# Patient Record
Sex: Male | Born: 1950 | Hispanic: No | Marital: Married | Smoking: Former smoker
Health system: Southern US, Community
[De-identification: ages and names within clinical notes are randomized; demographics above are authoritative.]

## PROBLEM LIST (undated history)

## (undated) ENCOUNTER — Emergency Department (HOSPITAL_COMMUNITY): Admission: EM | Payer: Medicare Other | Source: Home / Self Care

## (undated) DIAGNOSIS — N189 Chronic kidney disease, unspecified: Secondary | ICD-10-CM

## (undated) DIAGNOSIS — D509 Iron deficiency anemia, unspecified: Secondary | ICD-10-CM

## (undated) DIAGNOSIS — E7800 Pure hypercholesterolemia, unspecified: Secondary | ICD-10-CM

## (undated) DIAGNOSIS — E119 Type 2 diabetes mellitus without complications: Secondary | ICD-10-CM

## (undated) DIAGNOSIS — I2510 Atherosclerotic heart disease of native coronary artery without angina pectoris: Secondary | ICD-10-CM

## (undated) DIAGNOSIS — R002 Palpitations: Secondary | ICD-10-CM

## (undated) DIAGNOSIS — I5020 Unspecified systolic (congestive) heart failure: Secondary | ICD-10-CM

## (undated) DIAGNOSIS — I10 Essential (primary) hypertension: Secondary | ICD-10-CM

## (undated) HISTORY — DX: Palpitations: R00.2

## (undated) HISTORY — PX: COLONOSCOPY: SHX174

## (undated) HISTORY — DX: Atherosclerotic heart disease of native coronary artery without angina pectoris: I25.10

## (undated) HISTORY — DX: Chronic kidney disease, unspecified: N18.9

## (undated) HISTORY — PX: CORONARY ANGIOPLASTY WITH STENT PLACEMENT: SHX49

## (undated) HISTORY — DX: Unspecified systolic (congestive) heart failure: I50.20

## (undated) HISTORY — DX: Iron deficiency anemia, unspecified: D50.9

---
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
## 2017-06-01 ENCOUNTER — Encounter: Payer: Self-pay | Admitting: *Deleted

## 2017-06-01 DIAGNOSIS — Z23 Encounter for immunization: Secondary | ICD-10-CM

## 2020-03-15 ENCOUNTER — Other Ambulatory Visit: Payer: Self-pay

## 2020-03-15 ENCOUNTER — Emergency Department (HOSPITAL_BASED_OUTPATIENT_CLINIC_OR_DEPARTMENT_OTHER): Payer: Medicare Other

## 2020-03-15 ENCOUNTER — Encounter (HOSPITAL_BASED_OUTPATIENT_CLINIC_OR_DEPARTMENT_OTHER): Payer: Self-pay

## 2020-03-15 DIAGNOSIS — I25110 Atherosclerotic heart disease of native coronary artery with unstable angina pectoris: Principal | ICD-10-CM | POA: Diagnosis present

## 2020-03-15 DIAGNOSIS — I493 Ventricular premature depolarization: Secondary | ICD-10-CM | POA: Diagnosis present

## 2020-03-15 DIAGNOSIS — Z79899 Other long term (current) drug therapy: Secondary | ICD-10-CM

## 2020-03-15 DIAGNOSIS — Z20822 Contact with and (suspected) exposure to covid-19: Secondary | ICD-10-CM | POA: Diagnosis present

## 2020-03-15 DIAGNOSIS — E8770 Fluid overload, unspecified: Secondary | ICD-10-CM | POA: Diagnosis present

## 2020-03-15 DIAGNOSIS — I129 Hypertensive chronic kidney disease with stage 1 through stage 4 chronic kidney disease, or unspecified chronic kidney disease: Secondary | ICD-10-CM | POA: Diagnosis present

## 2020-03-15 DIAGNOSIS — Z87891 Personal history of nicotine dependence: Secondary | ICD-10-CM

## 2020-03-15 DIAGNOSIS — N179 Acute kidney failure, unspecified: Secondary | ICD-10-CM | POA: Diagnosis present

## 2020-03-15 DIAGNOSIS — R079 Chest pain, unspecified: Secondary | ICD-10-CM | POA: Diagnosis not present

## 2020-03-15 DIAGNOSIS — N183 Chronic kidney disease, stage 3 unspecified: Secondary | ICD-10-CM | POA: Diagnosis present

## 2020-03-15 DIAGNOSIS — I255 Ischemic cardiomyopathy: Secondary | ICD-10-CM | POA: Diagnosis present

## 2020-03-15 DIAGNOSIS — Z7984 Long term (current) use of oral hypoglycemic drugs: Secondary | ICD-10-CM

## 2020-03-15 DIAGNOSIS — E785 Hyperlipidemia, unspecified: Secondary | ICD-10-CM | POA: Diagnosis present

## 2020-03-15 DIAGNOSIS — R195 Other fecal abnormalities: Secondary | ICD-10-CM | POA: Diagnosis present

## 2020-03-15 DIAGNOSIS — Z955 Presence of coronary angioplasty implant and graft: Secondary | ICD-10-CM

## 2020-03-15 DIAGNOSIS — E1122 Type 2 diabetes mellitus with diabetic chronic kidney disease: Secondary | ICD-10-CM | POA: Diagnosis present

## 2020-03-15 DIAGNOSIS — Z7902 Long term (current) use of antithrombotics/antiplatelets: Secondary | ICD-10-CM

## 2020-03-15 LAB — CBC
HCT: 27.7 % — ABNORMAL LOW (ref 39.0–52.0)
Hemoglobin: 8.7 g/dL — ABNORMAL LOW (ref 13.0–17.0)
MCH: 26.2 pg (ref 26.0–34.0)
MCHC: 31.4 g/dL (ref 30.0–36.0)
MCV: 83.4 fL (ref 80.0–100.0)
Platelets: 397 10*3/uL (ref 150–400)
RBC: 3.32 MIL/uL — ABNORMAL LOW (ref 4.22–5.81)
RDW: 14 % (ref 11.5–15.5)
WBC: 7.8 10*3/uL (ref 4.0–10.5)
nRBC: 0 % (ref 0.0–0.2)

## 2020-03-15 LAB — BASIC METABOLIC PANEL
Anion gap: 12 (ref 5–15)
BUN: 36 mg/dL — ABNORMAL HIGH (ref 8–23)
CO2: 23 mmol/L (ref 22–32)
Calcium: 9 mg/dL (ref 8.9–10.3)
Chloride: 103 mmol/L (ref 98–111)
Creatinine, Ser: 1.4 mg/dL — ABNORMAL HIGH (ref 0.61–1.24)
GFR calc Af Amer: 60 mL/min — ABNORMAL LOW (ref >60)
GFR calc non Af Amer: 52 mL/min — ABNORMAL LOW (ref >60)
Glucose, Bld: 164 mg/dL — ABNORMAL HIGH (ref 70–99)
Potassium: 4.3 mmol/L (ref 3.5–5.1)
Sodium: 138 mmol/L (ref 135–145)

## 2020-03-15 LAB — TROPONIN I (HIGH SENSITIVITY): Troponin I (High Sensitivity): 13 ng/L (ref <18)

## 2020-03-15 LAB — CBG MONITORING, ED: Glucose-Capillary: 176 mg/dL — ABNORMAL HIGH (ref 70–99)

## 2020-03-15 IMAGING — DX DG CHEST 2V
2 series · 2 of 2 positions shown · non-contrast
Comparison: None.

CLINICAL DATA: Chest pain.  Palpitations.

EXAM:
CHEST - 2 VIEW

[chest pa]
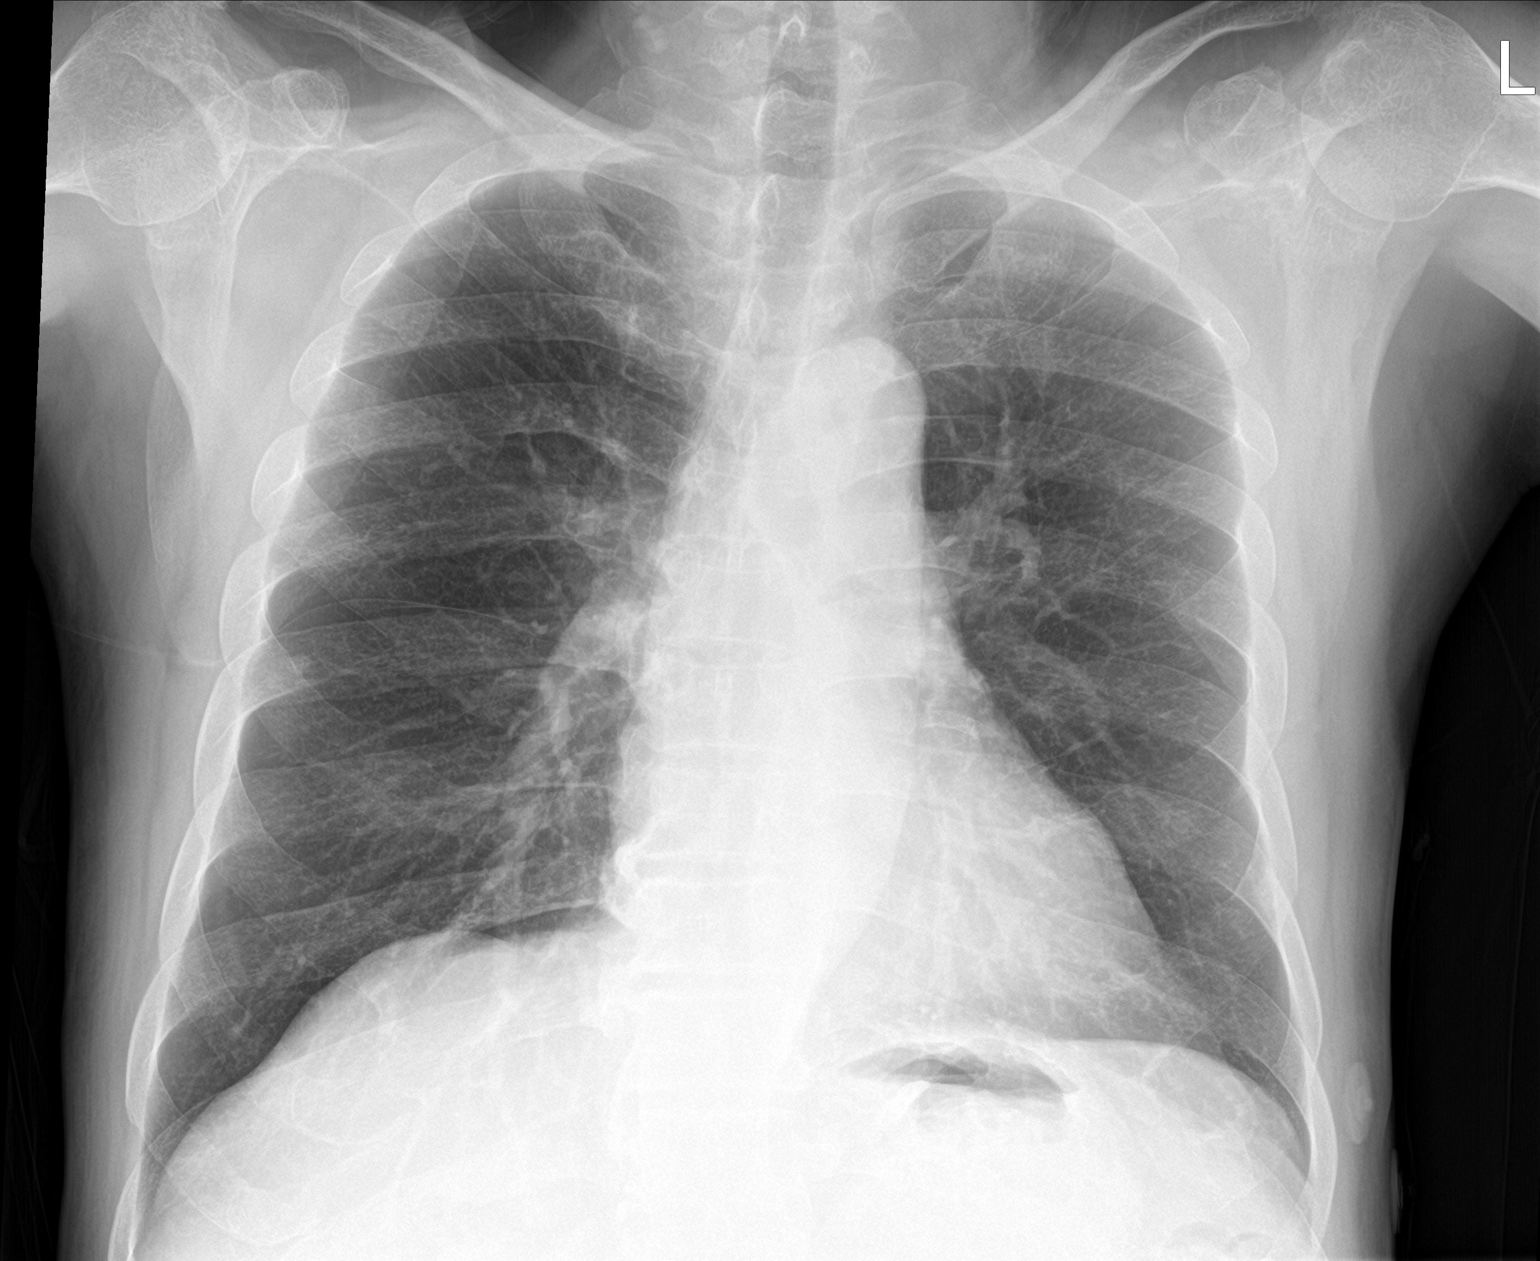

[chest lat]
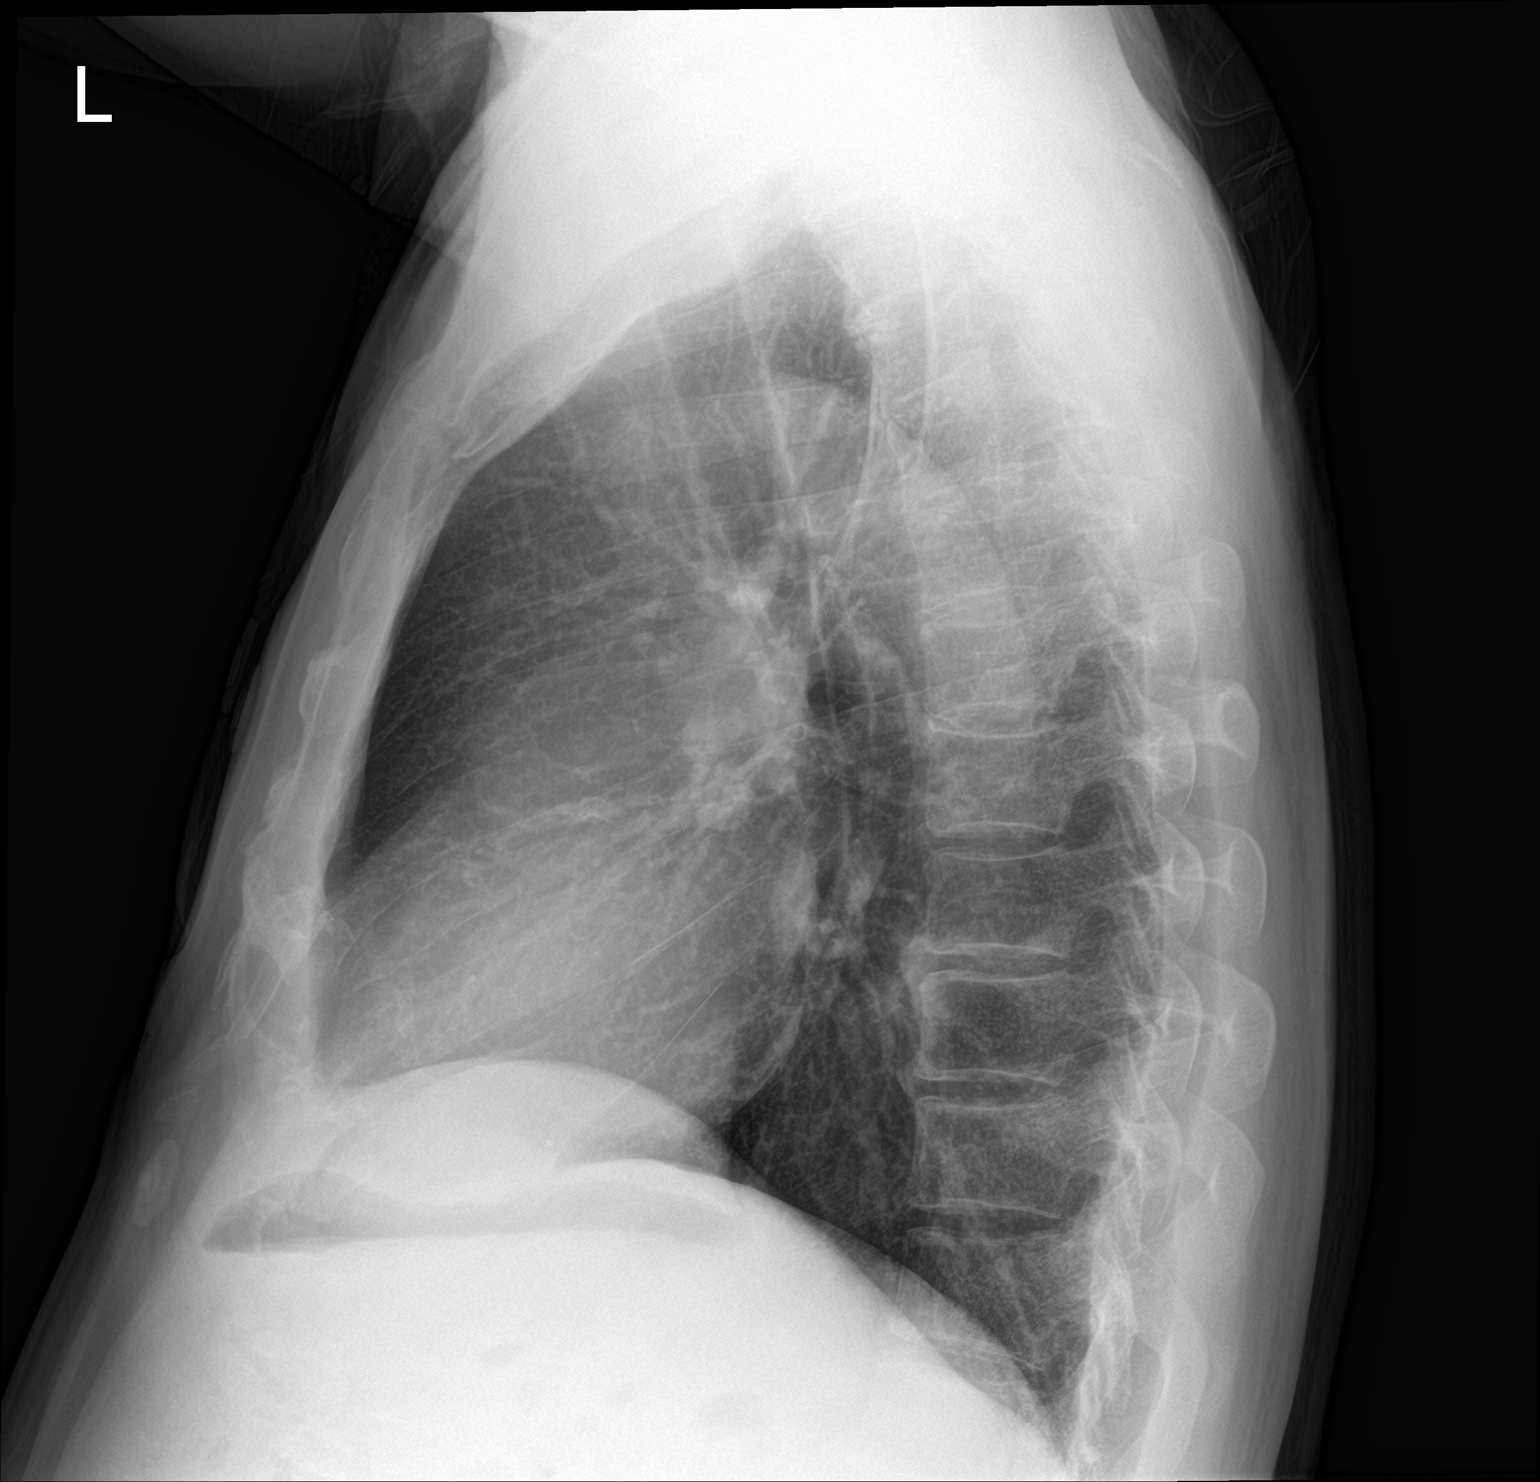

[2 of 2 positions shown; findings below may reference images not displayed]

FINDINGS: The cardiomediastinal contours are normal. Coronary stent is
visualized. Pulmonary vasculature is normal. No consolidation,
pleural effusion, or pneumothorax. Calcification projecting over the
right seventh rib may be a bone island or calcified granuloma,
regardless benign. No acute osseous abnormalities are seen.
IMPRESSION: 1. No acute chest findings.
2. Coronary stent.

## 2020-03-15 MED ORDER — SODIUM CHLORIDE 0.9% FLUSH
3.0000 mL | Freq: Once | INTRAVENOUS | Status: DC
  Filled 2020-03-15: qty 3

## 2020-03-15 NOTE — ED Triage Notes (Addendum)
Per daughter pt with CP since 7pm-palpitations x 1 week-denies fever/flu sx-pt speaks minimal english-NAD-to triage in w/c

## 2020-03-16 ENCOUNTER — Inpatient Hospital Stay (HOSPITAL_BASED_OUTPATIENT_CLINIC_OR_DEPARTMENT_OTHER)
Admission: EM | Admit: 2020-03-16 | Discharge: 2020-03-30 | DRG: 234 | Disposition: A | Discharge status: Home / Self Care | Payer: Medicare Other | Attending: Cardiothoracic Surgery | Admitting: Cardiothoracic Surgery

## 2020-03-16 DIAGNOSIS — N179 Acute kidney failure, unspecified: Secondary | ICD-10-CM | POA: Diagnosis not present

## 2020-03-16 DIAGNOSIS — R079 Chest pain, unspecified: Secondary | ICD-10-CM

## 2020-03-16 DIAGNOSIS — I25110 Atherosclerotic heart disease of native coronary artery with unstable angina pectoris: Secondary | ICD-10-CM | POA: Diagnosis not present

## 2020-03-16 DIAGNOSIS — I259 Chronic ischemic heart disease, unspecified: Secondary | ICD-10-CM

## 2020-03-16 DIAGNOSIS — J9811 Atelectasis: Secondary | ICD-10-CM

## 2020-03-16 DIAGNOSIS — Z79899 Other long term (current) drug therapy: Secondary | ICD-10-CM | POA: Diagnosis not present

## 2020-03-16 DIAGNOSIS — Z419 Encounter for procedure for purposes other than remedying health state, unspecified: Secondary | ICD-10-CM

## 2020-03-16 DIAGNOSIS — I493 Ventricular premature depolarization: Secondary | ICD-10-CM | POA: Diagnosis not present

## 2020-03-16 DIAGNOSIS — Z794 Long term (current) use of insulin: Secondary | ICD-10-CM

## 2020-03-16 DIAGNOSIS — I129 Hypertensive chronic kidney disease with stage 1 through stage 4 chronic kidney disease, or unspecified chronic kidney disease: Secondary | ICD-10-CM | POA: Diagnosis not present

## 2020-03-16 DIAGNOSIS — E119 Type 2 diabetes mellitus without complications: Secondary | ICD-10-CM

## 2020-03-16 DIAGNOSIS — Z20822 Contact with and (suspected) exposure to covid-19: Secondary | ICD-10-CM | POA: Diagnosis not present

## 2020-03-16 DIAGNOSIS — Z87891 Personal history of nicotine dependence: Secondary | ICD-10-CM | POA: Diagnosis not present

## 2020-03-16 DIAGNOSIS — Z7902 Long term (current) use of antithrombotics/antiplatelets: Secondary | ICD-10-CM | POA: Diagnosis not present

## 2020-03-16 DIAGNOSIS — N183 Chronic kidney disease, stage 3 unspecified: Secondary | ICD-10-CM | POA: Diagnosis not present

## 2020-03-16 DIAGNOSIS — I10 Essential (primary) hypertension: Secondary | ICD-10-CM

## 2020-03-16 DIAGNOSIS — E1142 Type 2 diabetes mellitus with diabetic polyneuropathy: Secondary | ICD-10-CM

## 2020-03-16 DIAGNOSIS — Z7984 Long term (current) use of oral hypoglycemic drugs: Secondary | ICD-10-CM | POA: Diagnosis not present

## 2020-03-16 DIAGNOSIS — E8770 Fluid overload, unspecified: Secondary | ICD-10-CM | POA: Diagnosis not present

## 2020-03-16 DIAGNOSIS — Z951 Presence of aortocoronary bypass graft: Secondary | ICD-10-CM

## 2020-03-16 DIAGNOSIS — I255 Ischemic cardiomyopathy: Secondary | ICD-10-CM | POA: Diagnosis not present

## 2020-03-16 DIAGNOSIS — Z955 Presence of coronary angioplasty implant and graft: Secondary | ICD-10-CM | POA: Diagnosis not present

## 2020-03-16 DIAGNOSIS — I2510 Atherosclerotic heart disease of native coronary artery without angina pectoris: Secondary | ICD-10-CM

## 2020-03-16 DIAGNOSIS — E785 Hyperlipidemia, unspecified: Secondary | ICD-10-CM | POA: Diagnosis not present

## 2020-03-16 DIAGNOSIS — E1122 Type 2 diabetes mellitus with diabetic chronic kidney disease: Secondary | ICD-10-CM | POA: Diagnosis not present

## 2020-03-16 DIAGNOSIS — R195 Other fecal abnormalities: Secondary | ICD-10-CM | POA: Diagnosis not present

## 2020-03-16 HISTORY — DX: Type 2 diabetes mellitus without complications: E11.9

## 2020-03-16 HISTORY — DX: Essential (primary) hypertension: I10

## 2020-03-16 HISTORY — DX: Pure hypercholesterolemia, unspecified: E78.00

## 2020-03-16 LAB — GLUCOSE, CAPILLARY
Glucose-Capillary: 119 mg/dL — ABNORMAL HIGH (ref 70–99)
Glucose-Capillary: 257 mg/dL — ABNORMAL HIGH (ref 70–99)

## 2020-03-16 LAB — TROPONIN I (HIGH SENSITIVITY)
Troponin I (High Sensitivity): 12 ng/L (ref <18)
Troponin I (High Sensitivity): 13 ng/L (ref <18)

## 2020-03-16 LAB — SARS CORONAVIRUS 2 BY RT PCR (HOSPITAL ORDER, PERFORMED IN ~~LOC~~ HOSPITAL LAB): SARS Coronavirus 2: NEGATIVE

## 2020-03-16 LAB — MRSA PCR SCREENING: MRSA by PCR: NEGATIVE

## 2020-03-16 LAB — CBG MONITORING, ED: Glucose-Capillary: 95 mg/dL (ref 70–99)

## 2020-03-16 MED ORDER — ASPIRIN 81 MG PO CHEW
324.0000 mg | CHEWABLE_TABLET | Freq: Once | ORAL | Status: AC
  Administered 2020-03-16: 324 mg via ORAL
  Filled 2020-03-16: qty 4

## 2020-03-16 MED ORDER — HYDROCHLOROTHIAZIDE 12.5 MG PO CAPS
12.5000 mg | ORAL_CAPSULE | Freq: Every day | ORAL | Status: DC
  Administered 2020-03-16: 12.5 mg via ORAL
  Filled 2020-03-16 (×3): qty 1

## 2020-03-16 MED ORDER — INSULIN ASPART 100 UNIT/ML ~~LOC~~ SOLN
0.0000 [IU] | Freq: Every day | SUBCUTANEOUS | Status: DC
  Administered 2020-03-16 – 2020-03-19 (×3): 3 [IU] via SUBCUTANEOUS
  Administered 2020-03-20 – 2020-03-23 (×3): 2 [IU] via SUBCUTANEOUS

## 2020-03-16 MED ORDER — LOSARTAN POTASSIUM 50 MG PO TABS
50.0000 mg | ORAL_TABLET | Freq: Every day | ORAL | Status: DC
  Administered 2020-03-16 – 2020-03-23 (×8): 50 mg via ORAL
  Filled 2020-03-16 (×4): qty 1
  Filled 2020-03-16: qty 2
  Filled 2020-03-16 (×3): qty 1

## 2020-03-16 MED ORDER — LOSARTAN POTASSIUM-HCTZ 50-12.5 MG PO TABS: 1.0000 | ORAL_TABLET | Freq: Every day | ORAL | Status: DC

## 2020-03-16 MED ORDER — METFORMIN HCL 500 MG PO TABS
1000.0000 mg | ORAL_TABLET | Freq: Two times a day (BID) | ORAL | Status: DC
  Filled 2020-03-16: qty 2

## 2020-03-16 MED ORDER — METOPROLOL SUCCINATE ER 25 MG PO TB24
25.0000 mg | ORAL_TABLET | Freq: Every day | ORAL | Status: DC
  Administered 2020-03-16 – 2020-03-21 (×5): 25 mg via ORAL
  Filled 2020-03-16 (×6): qty 1

## 2020-03-16 MED ORDER — GLIPIZIDE 5 MG PO TABS
5.0000 mg | ORAL_TABLET | Freq: Two times a day (BID) | ORAL | Status: DC
  Filled 2020-03-16 (×2): qty 1

## 2020-03-16 MED ORDER — INSULIN ASPART 100 UNIT/ML ~~LOC~~ SOLN
0.0000 [IU] | Freq: Three times a day (TID) | SUBCUTANEOUS | Status: DC
  Administered 2020-03-18 – 2020-03-19 (×3): 2 [IU] via SUBCUTANEOUS
  Administered 2020-03-19: 3 [IU] via SUBCUTANEOUS
  Administered 2020-03-19 – 2020-03-20 (×2): 1 [IU] via SUBCUTANEOUS
  Administered 2020-03-20: 2 [IU] via SUBCUTANEOUS
  Administered 2020-03-20: 1 [IU] via SUBCUTANEOUS
  Administered 2020-03-21 (×2): 3 [IU] via SUBCUTANEOUS
  Administered 2020-03-21: 2 [IU] via SUBCUTANEOUS
  Administered 2020-03-22: 7 [IU] via SUBCUTANEOUS
  Administered 2020-03-22: 1 [IU] via SUBCUTANEOUS
  Administered 2020-03-22: 3 [IU] via SUBCUTANEOUS
  Administered 2020-03-23: 7 [IU] via SUBCUTANEOUS
  Administered 2020-03-23: 2 [IU] via SUBCUTANEOUS
  Administered 2020-03-23: 3 [IU] via SUBCUTANEOUS

## 2020-03-16 MED ORDER — ENOXAPARIN SODIUM 40 MG/0.4ML ~~LOC~~ SOLN
40.0000 mg | SUBCUTANEOUS | Status: DC
  Administered 2020-03-16: 40 mg via SUBCUTANEOUS
  Filled 2020-03-16: qty 0.4

## 2020-03-16 MED ORDER — ATORVASTATIN CALCIUM 40 MG PO TABS
40.0000 mg | ORAL_TABLET | Freq: Every day | ORAL | Status: DC
  Administered 2020-03-16: 40 mg via ORAL
  Filled 2020-03-16 (×2): qty 1

## 2020-03-16 MED ORDER — METFORMIN HCL 500 MG PO TABS: 1000.0000 mg | ORAL_TABLET | Freq: Two times a day (BID) | ORAL | Status: DC

## 2020-03-16 MED ORDER — SODIUM CHLORIDE 0.9 % IV SOLN: INTRAVENOUS | Status: DC

## 2020-03-16 MED ORDER — NITROGLYCERIN 0.4 MG SL SUBL
0.4000 mg | SUBLINGUAL_TABLET | SUBLINGUAL | Status: DC | PRN
  Filled 2020-03-16: qty 1

## 2020-03-16 MED ORDER — CLOPIDOGREL BISULFATE 75 MG PO TABS
75.0000 mg | ORAL_TABLET | Freq: Every day | ORAL | Status: DC
  Administered 2020-03-16 – 2020-03-17 (×2): 75 mg via ORAL
  Filled 2020-03-16 (×2): qty 1

## 2020-03-16 NOTE — ED Provider Notes (Signed)
TIME SEEN: 12:38 AM  CHIEF COMPLAINT: Chest pain, shortness of breath  HPI: Patient is a 69 year old [name] male who presents to the emergency department with his daughter for concerns for chest pain tonight that occurred around 7 PM.  Describes them as left-sided, tightness, squeezing and similar to his previous anginal equivalent.  No radiation of pain.  Has had associated shortness of breath.  No nausea, vomiting, diaphoresis or dizziness.  No fever or cough.  Symptoms worse with exertion and better with rest.  He has had some shortness of breath for the past 2 weeks with exertion but this is the first time that he has had chest pain since he had 2 stents placed in 2010 in [place].  His cardiologist is Dr. Clista Bernhardt with [facility].  It appears he has had a stress test?  In November 2020 but I am unable to see these results in care everywhere and patient and family are not aware of the results.  He reports compliance with Plavix.  He has had both COVID-19 vaccinations.  No history of PE or DVT.  Chest pain-free currently.  Patient and daughter refused interpreter.  Daughter interpreting for patient at bedside.  ROS: See HPI Constitutional: no fever  Eyes: no drainage  ENT: no runny nose   Cardiovascular:   chest pain  Resp: SOB  GI: no vomiting GU: no dysuria Integumentary: no rash  Allergy: no hives  Musculoskeletal: no leg swelling  Neurological: no slurred speech ROS otherwise negative  PAST MEDICAL HISTORY/PAST SURGICAL HISTORY:  Past Medical History:  Diagnosis Date  . Diabetes mellitus without complication ([place])   . High cholesterol   . Hypertension     MEDICATIONS:  Prior to Admission medications   Not on File    ALLERGIES:  No Known Allergies  SOCIAL HISTORY:  Social History   Tobacco Use  . Smoking status: Former Research scientist (life sciences)  . Smokeless tobacco: Never Used  Substance Use Topics  . Alcohol use: Never    FAMILY HISTORY: No family history on file.  EXAM: BP 113/87  (BP Location: Left Arm)   Pulse (!) 108   Temp 98 F (36.7 C) (Oral)   Resp 18   Ht 5\' 9"  (1.753 m)   Wt 68 kg   SpO2 100%   BMI 22.15 kg/m  CONSTITUTIONAL: Alert and oriented and responds appropriately to questions. Well-appearing; well-nourished HEAD: Normocephalic EYES: Conjunctivae clear, pupils appear equal, EOM appear intact ENT: normal nose; moist mucous membranes NECK: Supple, normal ROM CARD: RRR; S1 and S2 appreciated; no murmurs, no clicks, no rubs, no gallops RESP: Normal chest excursion without splinting or tachypnea; breath sounds clear and equal bilaterally; no wheezes, no rhonchi, no rales, no hypoxia or respiratory distress, speaking full sentences ABD/GI: Normal bowel sounds; non-distended; soft, non-tender, no rebound, no guarding, no peritoneal signs, no hepatosplenomegaly BACK:  The back appears normal EXT: Normal ROM in all joints; no deformity noted, no edema; no cyanosis, no calf tenderness or calf swelling SKIN: Normal color for age and race; warm; no rash on exposed skin NEURO: Moves all extremities equally PSYCH: The patient's mood and manner are appropriate.   MEDICAL DECISION MAKING: Patient here with complaints of chest pain, shortness of breath that are similar to his anginal equivalent.  I can see that it looks like he has had a stress test in care everywhere in November 2020 but I am unable to see the results and daughter is not able to tell me his results.  He is chest pain-free currently but has risk factors for ACS and states this feels like his previous anginal equivalent.  Last cath was in 2010 in [place].  Will give aspirin, nitroglycerin as needed.  Will obtain cardiac labs, chest x-ray.  Anticipate admission.  ED PROGRESS: Patient's labs showed creatinine of 1.4, hemoglobin of 8.7.  Again I am having a hard time looking at any of his records in care everywhere for some reason.  He denies any melena or bright red blood per rectum.  He is on Plavix.   First troponin is negative.  Chest x-ray clear.  I feel he needs admission for chest pain rule out given his risk factors and symptoms.  Patient and daughter are comfortable with this plan.  We have contacted [facility] where his cardiology is pleasant and unfortunately at this time [facility] does not have any beds.  Discussed this with patient and daughter.  We will talk to the physician.    2:15 AM Discussed patient's case with hospitalist, Dr. Jenetta Downer bed.  I have recommended admission and patient (and family if present) agree with this plan. Admitting physician will place admission orders.   I reviewed all nursing notes, vitals, pertinent previous records and reviewed/interpreted all EKGs, lab and urine results, imaging (as available).    Patient has had 2 more troponins that have been flat.  Continues to be chest pain-free.  Covid negative.    EKG Interpretation  Date/Time:  Tuesday March 15 2020 22:52:35 EDT Ventricular Rate:  108 PR Interval:  170 QRS Duration: 86 QT Interval:  334 QTC Calculation: 447 R Axis:   72 Text Interpretation: Sinus tachycardia with occasional Premature ventricular complexes Inferior infarct , age undetermined Anterior infarct , age undetermined Abnormal ECG No old tracing to compare Confirmed by Micca Matura, Cyril Mourning [phone]) on 03/15/2020 11:57:39 PM         Jimmy Blankenship was evaluated in Emergency Department on 03/16/2020 for the symptoms described in the history of present illness. He was evaluated in the context of the global COVID-19 pandemic, which necessitated consideration that the patient might be at risk for infection with the SARS-CoV-2 virus that causes COVID-19. Institutional protocols and algorithms that pertain to the evaluation of patients at risk for COVID-19 are in a state of rapid change based on information released by regulatory bodies including the CDC and federal and state organizations. These policies and algorithms were followed during the  patient's care in the ED.      Francyne Arreaga, Delice Bison, DO 03/16/20 [phone]

## 2020-03-16 NOTE — H&P (Signed)
History and Physical    Jimmy Blankenship RUE:[id number] DOB: 1950-09-19 DOA: 03/16/2020  PCP: [name], MD   Patient coming from: [facility]    Chief Complaint: Chest pain  HPI: Jimmy Blankenship is a 69 y.o. [name] speaking  male with medical history significant of coronary artery disease disease status post stenting, diabetes type 2, hypertension, hyperlipidemia, normocytic anemia who presents to the emergency department at [facility] with complaints of chest pain.  He was sent from [facility] to [facility] for further evaluation. He initially presented to [facility] with complaints of chest pain that got worse for 1 day.  He was having intermittent chest pain, palpitations for the last week.  Chest pain started to get worse when he was returning from work while at driving.  He works at [employer].  Patient was also feeling palpitations, dyspnea on minimal to moderate exertion for the same duration.  He developed chest pain which he describes as pressure, rated as 3/10, located on the left side with no radiation.  Patient said his chest pain resolved after taking the nitroglycerin. Patient has history of coronary artery disease and had stents placed about 11 years ago in [place].  He follows with cardiology at [facility].  Patient says he is compliant with his medications.  He is a past smoker. Patient seen and examined at the bedside on the floor.  Remains comfortable without any chest pain.  Denies any shortness of breath, cough, nausea, vomiting, fever, diarrhea, vomiting.  Daughter was at the bedside.  ED Course: On presentation, he was hemodynamically stable,chest pain-free.  EKG did not show any ischemic changes, showed normal sinus rhythm with occasional PVCs. Troponins were cycled and they were all negative.  He was given aspirin and nitroglycerin.  Chest pain has not occurred after he presented to the hospital.  Given his typical presentation with chest pain and history of coronary  artery disease, patient was sent to [facility] for evaluation.  Initially he was tried  to be sent to [facility] but there were no beds.  Cardiology has been consulted.  Keeping him n.p.o. after midnight for possible stress test versus cath.  Review of Systems: As per HPI otherwise 10 point review of systems negative.    Past Medical History:  Diagnosis Date  . Diabetes mellitus without complication ([place])   . High cholesterol   . Hypertension     Past Surgical History:  Procedure Laterality Date  . CORONARY ANGIOPLASTY WITH STENT PLACEMENT       reports that he has quit smoking. He has never used smokeless tobacco. He reports that he does not drink alcohol and does not use drugs.  No Known Allergies  No family history on file.   Prior to Admission medications   Medication Sig Start Date End Date Taking? Authorizing Provider  atorvastatin (LIPITOR) 40 MG tablet Take 40 mg by mouth daily. 03/05/20  Yes [provider]  clopidogrel (PLAVIX) 75 MG tablet Take 75 mg by mouth daily. 12/25/19  Yes [provider]  glipiZIDE (GLUCOTROL) 5 MG tablet Take 5 mg by mouth 2 (two) times daily. 02/10/20  Yes [provider]  losartan-hydrochlorothiazide (HYZAAR) 50-12.5 MG tablet Take 1 tablet by mouth daily. 12/25/19  Yes [provider]  metFORMIN (GLUCOPHAGE) 1000 MG tablet Take 1,000 mg by mouth 2 (two) times daily. 02/04/20  Yes [provider]  metoprolol succinate (TOPROL-XL) 25 MG 24 hr tablet Take 25 mg by  mouth daily. 02/29/20  Yes [provider]    Physical Exam: Vitals:   03/16/20 1400 03/16/20 1430 03/16/20 1500 03/16/20 1628  BP: 98/63 104/64 96/65 127/64  Pulse: 80 80 77   Resp: 18 19 16 14   Temp:    97.7 F (36.5 C)  TempSrc:    Oral  SpO2: 100% 100% 100% 100%  Weight:    65.9 kg  Height:    5\' 8"  (1.727 m)    Constitutional: NAD, calm, comfortable,pleasant gentleman Vitals:   03/16/20 1400 03/16/20 1430 03/16/20 1500 03/16/20  1628  BP: 98/63 104/64 96/65 127/64  Pulse: 80 80 77   Resp: 18 19 16 14   Temp:    97.7 F (36.5 C)  TempSrc:    Oral  SpO2: 100% 100% 100% 100%  Weight:    65.9 kg  Height:    5\' 8"  (1.727 m)   Eyes: PERRL, lids and conjunctivae normal ENMT: Mucous membranes are moist.  Neck: normal, supple, no masses, no thyromegaly Respiratory: clear to auscultation bilaterally, no wheezing, no crackles. Normal respiratory effort. No accessory muscle use.  Cardiovascular: Regular rate and rhythm, no murmurs / rubs / gallops. No extremity edema.  Abdomen: no tenderness, no masses palpated. No hepatosplenomegaly. Bowel sounds positive.  Musculoskeletal: no clubbing / cyanosis. No joint deformity upper and lower extremities.  Skin: no rashes, lesions, ulcers. No induration Neurologic: CN 2-12 grossly intact.  Strength 5/5 in all 4.  Psychiatric: Normal judgment and insight. Alert and oriented x 3. Normal mood.   Foley Catheter:None  Labs on Admission: I have personally reviewed following labs and imaging studies  CBC: Recent Labs  Lab 03/15/20 2312  WBC 7.8  HGB 8.7*  HCT 27.7*  MCV 83.4  PLT 119   Basic Metabolic Panel: Recent Labs  Lab 03/15/20 2312  NA 138  K 4.3  CL 103  CO2 23  GLUCOSE 164*  BUN 36*  CREATININE 1.40*  CALCIUM 9.0   GFR: Estimated Creatinine Clearance: 47.7 mL/min (A) (by C-G formula based on SCr of 1.4 mg/dL (H)). Liver Function Tests: No results for input(s): AST, ALT, ALKPHOS, BILITOT, PROT, ALBUMIN in the last 168 hours. No results for input(s): LIPASE, AMYLASE in the last 168 hours. No results for input(s): AMMONIA in the last 168 hours. Coagulation Profile: No results for input(s): INR, PROTIME in the last 168 hours. Cardiac Enzymes: No results for input(s): CKTOTAL, CKMB, CKMBINDEX, TROPONINI in the last 168 hours. BNP (last 3 results) No results for input(s): PROBNP in the last 8760 hours. HbA1C: No results for input(s): HGBA1C in the last 72  hours. CBG: Recent Labs  Lab 03/15/20 2301 03/16/20 0909 03/16/20 1641  GLUCAP 176* 95 119*   Lipid Profile: No results for input(s): CHOL, HDL, LDLCALC, TRIG, CHOLHDL, LDLDIRECT in the last 72 hours. Thyroid Function Tests: No results for input(s): TSH, T4TOTAL, FREET4, T3FREE, THYROIDAB in the last 72 hours. Anemia Panel: No results for input(s): VITAMINB12, FOLATE, FERRITIN, TIBC, IRON, RETICCTPCT in the last 72 hours. Urine analysis: No results found for: COLORURINE, APPEARANCEUR, LABSPEC, [place], GLUCOSEU, HGBUR, BILIRUBINUR, KETONESUR, PROTEINUR, UROBILINOGEN, NITRITE, LEUKOCYTESUR  Radiological Exams on Admission: DG Chest 2 View  Result Date: 03/15/2020 CLINICAL DATA:  Chest pain.  Palpitations. EXAM: CHEST - 2 VIEW COMPARISON:  None. FINDINGS: The cardiomediastinal contours are normal. Coronary stent is visualized. Pulmonary vasculature is normal. No consolidation, pleural effusion, or pneumothorax. Calcification projecting over the right seventh rib may be a bone island or calcified granuloma,  regardless benign. No acute osseous abnormalities are seen. IMPRESSION: 1. No acute chest findings. 2. Coronary stent. Electronically Signed   By: Keith Rake M.D.   On: 03/15/2020 23:32     Assessment/Plan Principal Problem:   Chest pain Active Problems:   CAD (coronary artery disease)   DM2 (diabetes mellitus, type 2) (HCC)   HTN (hypertension)   HLD (hyperlipidemia)   Normocytic anemia   AKI (acute kidney injury) ([place])    Chest pain: Associated with exertion and relieved with nitroglycerin.  Currently chest pain-free.  EKG did not show any ischemic changes.  Showed normal sinus rhythm with occasional PVCs.  Given his high risk and typical presentation, we have requested for cardiac evaluation.  We will keep him n.p.o. from midnight for possible stress test versus cath.  Coronary artery disease: History of stent placement about 11 months ago in [place].  Follows with  cardiology at [facility].  Takes Plavix, metoprolol, Lipitor, losartan and they have been restarted.  AKI: His baseline creatinine ranges from 1.1-1.2.  Elevated creatinine on presentation.  Could be associated with prerenal AKI with poor oral intake.  We will continue gentle IV fluids.  Check BMP tomorrow.  Normocytic anemia: Baseline hemoglobin in the range of 8.  No history of melena or hematochezia.  He takes Plavix.  Will check stool occult blood.  Will check iron studies.  Hypertension: Currently blood pressure stable.  Continue current medications.  Monitor blood pressure  Hyperlipidemia: On Lipitor  Diabetes type 2: On glipizide and Metformin at home.  Continue sliding-scale insulin here.  Monitor CBGs.  Severity of Illness: The appropriate patient status for this patient is OBSERVATION. Observation status is judged to be reasonable and necessary in order to provide the required intensity of service to ensure the patient's safety. The patient's presenting symptoms, physical exam findings, and initial radiographic and laboratory data in the context of their medical condition is felt to place them at decreased risk for further clinical deterioration. Furthermore, it is anticipated that the patient will be medically stable for discharge from the hospital within 2 midnights of admission. The following factors support the patient status of observation.      DVT prophylaxis: Lovenox Code Status: Full Family Communication: Daughter at bed side Consults called: Cardiology     Shelly Coss MD [facility]  03/16/2020, 5:30 PM

## 2020-03-16 NOTE — ED Notes (Signed)
Jimmy Blankenship, daughter, updated on bed location & phone number to floor.

## 2020-03-16 NOTE — ED Notes (Signed)
ED Provider at bedside. 

## 2020-03-17 ENCOUNTER — Encounter (HOSPITAL_COMMUNITY)
Admission: EM | Disposition: A | Discharge status: Home / Self Care | Payer: Self-pay | Source: Home / Self Care | Attending: Cardiothoracic Surgery

## 2020-03-17 ENCOUNTER — Other Ambulatory Visit: Payer: Self-pay | Admitting: *Deleted

## 2020-03-17 DIAGNOSIS — I259 Chronic ischemic heart disease, unspecified: Secondary | ICD-10-CM | POA: Diagnosis not present

## 2020-03-17 DIAGNOSIS — I25110 Atherosclerotic heart disease of native coronary artery with unstable angina pectoris: Secondary | ICD-10-CM | POA: Diagnosis not present

## 2020-03-17 DIAGNOSIS — R079 Chest pain, unspecified: Secondary | ICD-10-CM | POA: Diagnosis not present

## 2020-03-17 DIAGNOSIS — I200 Unstable angina: Secondary | ICD-10-CM | POA: Diagnosis not present

## 2020-03-17 DIAGNOSIS — I2510 Atherosclerotic heart disease of native coronary artery without angina pectoris: Secondary | ICD-10-CM

## 2020-03-17 HISTORY — PX: LEFT HEART CATH AND CORONARY ANGIOGRAPHY: CATH118249

## 2020-03-17 LAB — GLUCOSE, CAPILLARY
Glucose-Capillary: 107 mg/dL — ABNORMAL HIGH (ref 70–99)
Glucose-Capillary: 125 mg/dL — ABNORMAL HIGH (ref 70–99)
Glucose-Capillary: 113 mg/dL — ABNORMAL HIGH (ref 70–99)
Glucose-Capillary: 291 mg/dL — ABNORMAL HIGH (ref 70–99)

## 2020-03-17 LAB — BASIC METABOLIC PANEL
Anion gap: 9 (ref 5–15)
BUN: 24 mg/dL — ABNORMAL HIGH (ref 8–23)
CO2: 23 mmol/L (ref 22–32)
Calcium: 8.9 mg/dL (ref 8.9–10.3)
Chloride: 105 mmol/L (ref 98–111)
Creatinine, Ser: 1.18 mg/dL (ref 0.61–1.24)
GFR calc Af Amer: >60 mL/min (ref >60)
GFR calc non Af Amer: >60 mL/min (ref >60)
Glucose, Bld: 106 mg/dL — ABNORMAL HIGH (ref 70–99)
Potassium: 4.3 mmol/L (ref 3.5–5.1)
Sodium: 137 mmol/L (ref 135–145)

## 2020-03-17 LAB — FERRITIN: Ferritin: 8 ng/mL — ABNORMAL LOW (ref 24–336)

## 2020-03-17 LAB — CBC WITH DIFFERENTIAL/PLATELET
Abs Immature Granulocytes: 0.03 10*3/uL (ref 0.00–0.07)
Basophils Absolute: 0.1 10*3/uL (ref 0.0–0.1)
Basophils Relative: 1 %
Eosinophils Absolute: 0.2 10*3/uL (ref 0.0–0.5)
Eosinophils Relative: 2 %
HCT: 28.1 % — ABNORMAL LOW (ref 39.0–52.0)
Hemoglobin: 8.5 g/dL — ABNORMAL LOW (ref 13.0–17.0)
Immature Granulocytes: 0 %
Lymphocytes Relative: 33 %
Lymphs Abs: 2.6 10*3/uL (ref 0.7–4.0)
MCH: 25.1 pg — ABNORMAL LOW (ref 26.0–34.0)
MCHC: 30.2 g/dL (ref 30.0–36.0)
MCV: 83.1 fL (ref 80.0–100.0)
Monocytes Absolute: 1 10*3/uL (ref 0.1–1.0)
Monocytes Relative: 12 %
Neutro Abs: 4.2 10*3/uL (ref 1.7–7.7)
Neutrophils Relative %: 52 %
Platelets: 403 10*3/uL — ABNORMAL HIGH (ref 150–400)
RBC: 3.38 MIL/uL — ABNORMAL LOW (ref 4.22–5.81)
RDW: 13.8 % (ref 11.5–15.5)
WBC: 8 10*3/uL (ref 4.0–10.5)
nRBC: 0 % (ref 0.0–0.2)

## 2020-03-17 LAB — IRON AND TIBC
Iron: 27 ug/dL — ABNORMAL LOW (ref 45–182)
Saturation Ratios: 7 % — ABNORMAL LOW (ref 17.9–39.5)
TIBC: 409 ug/dL (ref 250–450)
UIBC: 382 ug/dL

## 2020-03-17 LAB — OCCULT BLOOD X 1 CARD TO LAB, STOOL: Fecal Occult Bld: POSITIVE — AB

## 2020-03-17 SURGERY — LEFT HEART CATH AND CORONARY ANGIOGRAPHY
Anesthesia: LOCAL

## 2020-03-17 MED ORDER — FENTANYL CITRATE (PF) 100 MCG/2ML IJ SOLN
INTRAMUSCULAR | Status: AC
  Filled 2020-03-17: qty 2

## 2020-03-17 MED ORDER — SODIUM CHLORIDE 0.9 % IV SOLN
INTRAVENOUS | Status: AC | PRN
  Administered 2020-03-17: 500 mL via INTRAVENOUS

## 2020-03-17 MED ORDER — HEPARIN (PORCINE) 25000 UT/250ML-% IV SOLN
650.0000 [IU]/h | INTRAVENOUS | Status: DC
  Administered 2020-03-18: 750 [IU]/h via INTRAVENOUS
  Administered 2020-03-19: 700 [IU]/h via INTRAVENOUS
  Administered 2020-03-20 – 2020-03-24 (×3): 650 [IU]/h via INTRAVENOUS
  Filled 2020-03-17 (×5): qty 250

## 2020-03-17 MED ORDER — HEPARIN (PORCINE) IN NACL 1000-0.9 UT/500ML-% IV SOLN
INTRAVENOUS | Status: AC
  Filled 2020-03-17: qty 1000

## 2020-03-17 MED ORDER — LABETALOL HCL 5 MG/ML IV SOLN: 10.0000 mg | INTRAVENOUS | Status: AC | PRN

## 2020-03-17 MED ORDER — PANTOPRAZOLE SODIUM 40 MG IV SOLR
40.0000 mg | INTRAVENOUS | Status: DC
  Administered 2020-03-17 – 2020-03-19 (×3): 40 mg via INTRAVENOUS
  Filled 2020-03-17 (×3): qty 40

## 2020-03-17 MED ORDER — HEPARIN SODIUM (PORCINE) 1000 UNIT/ML IJ SOLN
INTRAMUSCULAR | Status: DC | PRN
  Administered 2020-03-17: 3500 [IU] via INTRAVENOUS

## 2020-03-17 MED ORDER — SODIUM CHLORIDE 0.9% FLUSH: 3.0000 mL | INTRAVENOUS | Status: DC | PRN

## 2020-03-17 MED ORDER — HYDRALAZINE HCL 20 MG/ML IJ SOLN: 10.0000 mg | INTRAMUSCULAR | Status: AC | PRN

## 2020-03-17 MED ORDER — SODIUM CHLORIDE 0.9% FLUSH
3.0000 mL | Freq: Two times a day (BID) | INTRAVENOUS | Status: DC
  Administered 2020-03-17 – 2020-03-22 (×8): 3 mL via INTRAVENOUS

## 2020-03-17 MED ORDER — SODIUM CHLORIDE 0.9 % IV SOLN
510.0000 mg | Freq: Once | INTRAVENOUS | Status: DC
  Filled 2020-03-17: qty 17

## 2020-03-17 MED ORDER — MIDAZOLAM HCL 2 MG/2ML IJ SOLN
INTRAMUSCULAR | Status: AC
  Filled 2020-03-17: qty 2

## 2020-03-17 MED ORDER — NITROGLYCERIN 1 MG/10 ML FOR IR/CATH LAB
INTRA_ARTERIAL | Status: AC
  Filled 2020-03-17: qty 10

## 2020-03-17 MED ORDER — ONDANSETRON HCL 4 MG/2ML IJ SOLN: 4.0000 mg | Freq: Four times a day (QID) | INTRAMUSCULAR | Status: DC | PRN

## 2020-03-17 MED ORDER — SODIUM CHLORIDE 0.9 % IV SOLN: 250.0000 mL | INTRAVENOUS | Status: DC | PRN

## 2020-03-17 MED ORDER — SODIUM CHLORIDE 0.9 % WEIGHT BASED INFUSION
1.0000 mL/kg/h | INTRAVENOUS | Status: DC
  Administered 2020-03-17: 1 mL/kg/h via INTRAVENOUS

## 2020-03-17 MED ORDER — MIDAZOLAM HCL 2 MG/2ML IJ SOLN
INTRAMUSCULAR | Status: DC | PRN
  Administered 2020-03-17: 1 mg via INTRAVENOUS

## 2020-03-17 MED ORDER — HEPARIN (PORCINE) IN NACL 1000-0.9 UT/500ML-% IV SOLN
INTRAVENOUS | Status: DC | PRN
  Administered 2020-03-17 (×2): 500 mL

## 2020-03-17 MED ORDER — SODIUM CHLORIDE 0.9% FLUSH
3.0000 mL | Freq: Two times a day (BID) | INTRAVENOUS | Status: DC
  Administered 2020-03-17: 3 mL via INTRAVENOUS

## 2020-03-17 MED ORDER — VERAPAMIL HCL 2.5 MG/ML IV SOLN
INTRAVENOUS | Status: AC
  Filled 2020-03-17: qty 2

## 2020-03-17 MED ORDER — ATORVASTATIN CALCIUM 80 MG PO TABS
80.0000 mg | ORAL_TABLET | Freq: Every day | ORAL | Status: DC
  Administered 2020-03-17 – 2020-03-30 (×13): 80 mg via ORAL
  Filled 2020-03-17 (×13): qty 1

## 2020-03-17 MED ORDER — IOHEXOL 350 MG/ML SOLN
INTRAVENOUS | Status: DC | PRN
  Administered 2020-03-17: 110 mL

## 2020-03-17 MED ORDER — HEPARIN SODIUM (PORCINE) 1000 UNIT/ML IJ SOLN
INTRAMUSCULAR | Status: AC
  Filled 2020-03-17: qty 1

## 2020-03-17 MED ORDER — ACETAMINOPHEN 325 MG PO TABS: 650.0000 mg | ORAL_TABLET | ORAL | Status: DC | PRN

## 2020-03-17 MED ORDER — VERAPAMIL HCL 2.5 MG/ML IV SOLN
INTRAVENOUS | Status: DC | PRN
  Administered 2020-03-17: 10 mL via INTRA_ARTERIAL

## 2020-03-17 MED ORDER — ASPIRIN 81 MG PO CHEW
81.0000 mg | CHEWABLE_TABLET | ORAL | Status: AC
  Administered 2020-03-17: 81 mg via ORAL
  Filled 2020-03-17: qty 1

## 2020-03-17 MED ORDER — FENTANYL CITRATE (PF) 100 MCG/2ML IJ SOLN
INTRAMUSCULAR | Status: DC | PRN
  Administered 2020-03-17: 25 ug via INTRAVENOUS

## 2020-03-17 MED ORDER — SODIUM CHLORIDE 0.9 % IV SOLN: INTRAVENOUS | Status: AC

## 2020-03-17 MED ORDER — OXYCODONE HCL 5 MG PO TABS: 5.0000 mg | ORAL_TABLET | ORAL | Status: DC | PRN

## 2020-03-17 MED ORDER — NITROGLYCERIN 1 MG/10 ML FOR IR/CATH LAB
INTRA_ARTERIAL | Status: DC | PRN
  Administered 2020-03-17: 100 ug via INTRACORONARY

## 2020-03-17 MED ORDER — ASPIRIN 81 MG PO CHEW
81.0000 mg | CHEWABLE_TABLET | Freq: Every day | ORAL | Status: DC
  Administered 2020-03-18 – 2020-03-23 (×6): 81 mg via ORAL
  Filled 2020-03-17 (×6): qty 1

## 2020-03-17 MED ORDER — SODIUM CHLORIDE 0.9 % WEIGHT BASED INFUSION
3.0000 mL/kg/h | INTRAVENOUS | Status: AC
  Administered 2020-03-17: 3 mL/kg/h via INTRAVENOUS

## 2020-03-17 MED ORDER — LIDOCAINE HCL (PF) 1 % IJ SOLN
INTRAMUSCULAR | Status: DC | PRN
  Administered 2020-03-17: 2 mL

## 2020-03-17 MED ORDER — LIDOCAINE HCL (PF) 1 % IJ SOLN
INTRAMUSCULAR | Status: AC
  Filled 2020-03-17: qty 30

## 2020-03-17 SURGICAL SUPPLY — 9 items

## 2020-03-17 NOTE — Progress Notes (Signed)
ANTICOAGULATION CONSULT NOTE - Initial Consult  Pharmacy Consult for heparin Indication: Post CAD Pending Surgery eval  No Known Allergies  Patient Measurements: Height: 5\' 8"  (172.7 cm) Weight: 65.9 kg (145 lb 4.5 oz) IBW/kg (Calculated) : 68.4 Vital Signs: Temp: 97.8 F (36.6 C) (07/08 1915) Temp Source: Oral (07/08 1915) BP: 121/67 (07/08 1915) Pulse Rate: 101 (07/08 1915)  Labs: Recent Labs    03/15/20 2312 03/16/20 0117 03/16/20 0356 03/17/20 0232  HGB 8.7*  --   --  8.5*  HCT 27.7*  --   --  28.1*  PLT 397  --   --  403*  CREATININE 1.40*  --   --  1.18  TROPONINIHS 13 12 13   --     Estimated Creatinine Clearance: 56.6 mL/min (by C-G formula based on SCr of 1.18 mg/dL).  Assessment: CC/HPI: 69 yo m post cath w 3V disease pending TCTS eval  PMH: CAD DM2 HTN HLD  Anticoag: none pta IV hep for acs  CV: pending TCTS eval for surgical options - though LAD not   Goal of Therapy:  Heparin level 0.3-0.7 units/ml Monitor platelets by anticoagulation protocol: Yes   Plan:  Heparin to start 8 hrs post sheath at 0000 Hep IV 750 units/hr - no bolus Initial HL 0600 Daily hep lvl cbc  Barth Kirks, PharmD, BCPS, BCCCP Clinical Pharmacist [phone]  Please check AMION for all [place] numbers  03/17/2020 7:29 PM

## 2020-03-17 NOTE — Consult Note (Addendum)
Cardiology Consultation:   Patient ID: Jimmy Blankenship; [id number]; 23-Dec-1950   Admit date: 03/16/2020 Date of Consult: 03/17/2020  Primary Care Provider: [name], MD Primary Cardiologist: [facility] (Dr. Maudie Mercury)  Patient Profile:   Jimmy Blankenship is a 69 y.o. male with a hx of CAD s/p PCIx 2 (approximately 11 years ago in [place]-approximately 2010), DM2, hypertension, hyperlipidemia and remote smoking who is being seen today for the evaluation of abnormal stress test at the request of Dr. Tawanna Solo.  History of Present Illness:   Jimmy Blankenship is a 69 year old male with a history stated above who presented to [facility] on 03/16/2020 with complaints of chest pain. HPI obtained through the use of remote interpreter and daughter at bedside. Patient reports he began having chest pain symptoms approximately 1 week ago however his symptoms worsened 2 days prior to presentation while driving home from work.  He reports he had stent placement in [place] approximately 10 years ago and has not had any issue with anginal symptoms since that time.  He follows with Dr. Maudie Mercury at [facility] and also with a cardiologist in [place] when he returns annually.  During his episode, he states he was fairly short of breath with no pain radiation.  He took 1 NTG spray with complete relief however given his cardiac history, he presented to outside ED for further evaluation.   He reports his very active at baseline and has had no complication or issues completing tasks however more recently does state things have become a little more difficult mainly with shortness of breath and what sounds like orthopnea symptoms.  He has been evaluated on several occasions for palpitations as below, monitor revealed no atrial fibrillation or other arrhythmias.  Denies recent illness with fever or chills.  Denies LE edema, dizziness or syncope.  On presentation, EKG with no acute ischemic changes with NSR and occasional PVCs.  HST found  to be 13>> 12>> 13 not consistent with ACS.  He was given ASA and nitroglycerin on arrival and has not had recurrent chest pain symptoms since this time.  Given his typical anginal presentation and history of CAD he was transferred to [facility] for further evaluation.  He was initially planned to go to [place] however there were no beds available.  Per chart review, he is followed by Dr. Maudie Mercury with [facility] for his cardiology care.  He underwent an echocardiogram 07/2019 which showed an LVEF of 50 to 55% with severe hypokinesis to akinesis of the distal septal and apical segments with moderate MR.  He was last seen by Dr. Maudie Mercury 08/24/2019 for the evaluation of palpitations at which time a Holter monitor was placed.  Results 09/25/2019 showed no evidence of tacky or bradycardia arrhythmias, no pausing greater than 2.5 seconds, no atrial fibrillation.    Past Medical History:  Diagnosis Date  . Diabetes mellitus without complication ([place])   . High cholesterol   . Hypertension     Past Surgical History:  Procedure Laterality Date  . CORONARY ANGIOPLASTY WITH STENT PLACEMENT       Prior to Admission medications   Medication Sig Start Date End Date Taking? Authorizing Provider  atorvastatin (LIPITOR) 40 MG tablet Take 40 mg by mouth daily. 03/05/20  Yes [provider]  clopidogrel (PLAVIX) 75 MG tablet Take 75 mg by mouth daily. 12/25/19  Yes [provider]  glipiZIDE (GLUCOTROL) 5 MG tablet Take 5 mg by mouth 2 (two) times daily.  02/10/20  Yes [provider]  losartan-hydrochlorothiazide (HYZAAR) 50-12.5 MG tablet Take 1 tablet by mouth daily. 12/25/19  Yes [provider]  metFORMIN (GLUCOPHAGE) 1000 MG tablet Take 1,000 mg by mouth 2 (two) times daily. 02/04/20  Yes [provider]  metoprolol succinate (TOPROL-XL) 25 MG 24 hr tablet Take 25 mg by mouth daily. 02/29/20  Yes [provider]    Inpatient Medications: Scheduled Meds: . atorvastatin   40 mg Oral Daily  . clopidogrel  75 mg Oral Daily  . enoxaparin (LOVENOX) injection  40 mg Subcutaneous Q24H  . losartan  50 mg Oral Daily   And  . hydrochlorothiazide  12.5 mg Oral Daily  . insulin aspart  0-5 Units Subcutaneous QHS  . insulin aspart  0-9 Units Subcutaneous TID WC  . metoprolol succinate  25 mg Oral Daily  . sodium chloride flush  3 mL Intravenous Once   Continuous Infusions: . sodium chloride 75 mL/hr at 03/17/20 1008  . ferumoxytol     PRN Meds: nitroGLYCERIN  Allergies:   No Known Allergies  Social History:   Social History   Socioeconomic History  . Marital status: Married    Spouse name: Not on file  . Number of children: Not on file  . Years of education: Not on file  . Highest education level: Not on file  Occupational History  . Not on file  Tobacco Use  . Smoking status: Former Research scientist (life sciences)  . Smokeless tobacco: Never Used  Vaping Use  . Vaping Use: Never used  Substance and Sexual Activity  . Alcohol use: Never  . Drug use: Never  . Sexual activity: Not on file  Other Topics Concern  . Not on file  Social History Narrative  . Not on file   Social Determinants of Health   Financial Resource Strain:   . Difficulty of Paying Living Expenses:   Food Insecurity:   . Worried About Charity fundraiser in the Last Year:   . Arboriculturist in the Last Year:   Transportation Needs:   . Film/video editor (Medical):   Marland Kitchen Lack of Transportation (Non-Medical):   Physical Activity:   . Days of Exercise per Week:   . Minutes of Exercise per Session:   Stress:   . Feeling of Stress :   Social Connections:   . Frequency of Communication with Friends and Family:   . Frequency of Social Gatherings with Friends and Family:   . Attends Religious Services:   . Active Member of Clubs or Organizations:   . Attends Archivist Meetings:   Marland Kitchen Marital Status:   Intimate Partner Violence:   . Fear of Current or Ex-Partner:   . Emotionally  Abused:   Marland Kitchen Physically Abused:   . Sexually Abused:     Family History:   No family history on file. Family Status:  No family status information on file.    ROS:  Please see the history of present illness.  All other ROS reviewed and negative.     Physical Exam/Data:   Vitals:   03/16/20 1914 03/17/20 0046 03/17/20 0413 03/17/20 0759  BP: 116/68 100/67 102/67 96/68  Pulse: (!) 108 85 74 95  Resp: 18 17 17 18   Temp: 99 F (37.2 C) 98.5 F (36.9 C) 97.7 F (36.5 C) 98.4 F (36.9 C)  TempSrc: Oral Oral Oral Oral  SpO2: 99% 99% 99% 100%  Weight:      Height:  Intake/Output Summary (Last 24 hours) at 03/17/2020 1034 Last data filed at 03/17/2020 0623 Gross per 24 hour  Intake 944.52 ml  Output 1250 ml  Net -305.48 ml   Filed Weights   03/15/20 2310 03/16/20 1628  Weight: 68 kg 65.9 kg   Body mass index is 22.09 kg/m.   General: Well developed, well nourished, NAD Skin: Warm, dry, intact  Neck: Negative for carotid bruits. No JVD Lungs:Clear to ausculation bilaterally. No wheezes, rales, or rhonchi. Breathing is unlabored. Cardiovascular: RRR with S1 S2. No murmurs Abdomen: Soft, non-tender, non-distended. No obvious abdominal masses. Extremities: No edema. Radial pulses 2+ bilaterally Neuro: Alert and oriented. No focal deficits. No facial asymmetry. MAE spontaneously. Psych: Responds to questions appropriately with normal affect.     EKG:  The EKG was personally reviewed and demonstrates: 03/15/2020 NSR with nonspecific T wave inversions, early repolarization in leads V2-V3 with no acute ischemic changes Telemetry:  Telemetry was personally reviewed and demonstrates: 03/17/2020 NSR  Relevant CV Studies:  Echo 07/2019 shows EF of 50 to 55% but with severe hypo to akinesis of the distal septal and apical segments, moderate MR  Cardiac monitor 09/25/2019: This is a technically adequate study.  The patient monitored for a period of 48 hours. The rhythm strips  demonstrated an underlying sinus rhythm.     The minimum heart rate was 63 beats per minute.  The average heart rate was 81 beats per minute.   The maximum heart rate was 120 beats per minute.    Occasional to frequent PACs and rare PACs noted.  PAC burden was 11.8%.    No evidence of tachy or brady arrhythmia identified in this study .   No pauses > 2.5 seconds were noted on this study.    No atrial fibrillation noted on this study.    No dairy submitted.     Laboratory Data:  Chemistry Recent Labs  Lab 03/15/20 2312 03/17/20 0232  NA 138 137  K 4.3 4.3  CL 103 105  CO2 23 23  GLUCOSE 164* 106*  BUN 36* 24*  CREATININE 1.40* 1.18  CALCIUM 9.0 8.9  GFRNONAA 52* >60  GFRAA 60* >60  ANIONGAP 12 9    No results found for: PROT, ALBUMIN, AST, ALT, ALKPHOS, BILITOT Hematology Recent Labs  Lab 03/15/20 2312 03/17/20 0232  WBC 7.8 8.0  RBC 3.32* 3.38*  HGB 8.7* 8.5*  HCT 27.7* 28.1*  MCV 83.4 83.1  MCH 26.2 25.1*  MCHC 31.4 30.2  RDW 14.0 13.8  PLT 397 403*   Cardiac EnzymesNo results for input(s): TROPONINI in the last 168 hours. No results for input(s): TROPIPOC in the last 168 hours.  BNPNo results for input(s): BNP, PROBNP in the last 168 hours.  DDimer No results for input(s): DDIMER in the last 168 hours. TSH: No results found for: TSH Lipids:No results found for: CHOL, HDL, LDLCALC, LDLDIRECT, TRIG, CHOLHDL HgbA1c:No results found for: HGBA1C  Radiology/Studies:  DG Chest 2 View  Result Date: 03/15/2020 CLINICAL DATA:  Chest pain.  Palpitations. EXAM: CHEST - 2 VIEW COMPARISON:  None. FINDINGS: The cardiomediastinal contours are normal. Coronary stent is visualized. Pulmonary vasculature is normal. No consolidation, pleural effusion, or pneumothorax. Calcification projecting over the right seventh rib may be a bone island or calcified granuloma, regardless benign. No acute osseous abnormalities are seen. IMPRESSION: 1. No acute chest  findings. 2. Coronary stent. Electronically Signed   By: Keith Rake M.D.   On: 03/15/2020 23:32   Assessment  and Plan:   1.  Chest pain with known history of CAD: -Patient reports having stent placement approximately 11 years ago while in [place] and follows with cardiology at [facility]. PTA medications include Plavix, metoprolol, Lipitor and losartan.  Patient was on his way home from work 2 days ago when he began having typical unstable anginal symptoms with anterior left-sided chest pain with associated shortness of breath and no pain radiation.  He has had no exertional or resting chest pain.  Does report some moderate fatigue which is new for him over the past month.  Since stent placement, has had no unstable anginal symptoms despite the duration since last evaluation.  He underwent an echocardiogram for Dr. Maudie Mercury 07/2019 which was found to be normal.  He wore a cardiac monitor 09/2019 for the evaluation of palpitations which was found to be normal with no arrhythmias. -hsT found to be minimally elevated at 12>> 13>> 12 not necessarily consistent with ACS -EKG with sinus tachycardia, HR 108 bpm with nonspecific T wave abnormalities and early repolarization in leads V2-3 -On PTA atorvastatin 40, Plavix 75, losartan 50, Toprol 25 -Plan for risk factor stratification with lipid panel and hemoglobin A1c -Would pursue further ischemic evaluation given duration since last cardiac evaluation and symptoms similar to prior cardiac episodes from 11 years ago.  We have no documentation of patient's coronary anatomy and stent placement therefore would opt for LHC for complete, definitive assessment despite rather negative work-up thus far. -Cardiac catheterization was discussed with the patient fully. The patient understands that risks include but are not limited to stroke (1 in 1000), death (1 in 23), kidney failure [usually temporary] (1 in 500), bleeding (1 in 200), allergic reaction [possibly serious] (1 in  200).  The patient understands and is willing to proceed.    2.  Hypertension: -Stable, 109/75, 96/68, 102/67 -Continue losartan 50, Toprol 25  3.  HLD: -Not on file epic -Continue atorvastatin  4.  DM2: -Hemoglobin A1c, pending -SSI for glucose control -Per primary team  5.  AKI: -Stabilizing, 1.18 today down from 1.40 on 03/15/2020 -Baseline appears to be in the 1.1-1.2 range -Felt to be in the setting of poor oral intake -Follow with daily labs  6.  Anemia: -Hemoglobin, 8.5 -No history of melena, hematuria   For questions or updates, please contact [place] Please consult [email or web address] for contact info under Cardiology/STEMI.   Lyndel Safe NP-C [place] Pager: [phone] 03/17/2020 10:34 AM   Attending Note:   The patient was seen and examined.  Agree with assessment and plan as noted above.  Changes made to the above note as needed.  Patient seen and independently examined with Kathyrn Drown, NP .   We discussed all aspects of the encounter. I agree with the assessment and plan as stated above.  1.  Unstable angina: Jimmy Blankenship presents with symptoms that are fairly identical to his previous episodes of angina.  Is described as a chest pain chest pressure.  Lasted for about 10 minutes and then was promptly relieved with 1 spray of sublingual nitroglycerin spray.  He has had a month of progressive fatigue and a little bit more shortness of breath with exertion.  These are consistent with recurrent unstable angina.  I think her best option is to proceed with heart catheterization. His EKG reveals Q waves in the anterior leads with question of minimal ST elevation.  I do not have an old EKG to compare.  He also has Q waves in  the inferior leads.  With the help of his daughter interpreting, we have discussed the risks, benefits, options of heart catheterization.  He understands and agrees to proceed.  We will try to get an formal interpreter to help [place] in  the Cath Lab during the procedure.  2.  Hypertension: Continue current medications.  3.  Diabetes mellitus: Continue current medications.    I have spent a total of 40 minutes with patient reviewing hospital  notes , telemetry, EKGs, labs and examining patient as well as establishing an assessment and plan that was discussed with the patient. > 50% of time was spent in direct patient care.    Thayer Headings, Brooke Bonito., MD, [facility] 03/17/2020, 11:34 AM [address]  [place] Pager [phone]

## 2020-03-17 NOTE — CV Procedure (Signed)
   Left heart cath with coronary angiography via the right radial approach with access achieved using real-time vascular ultrasound.  Dominant right coronary with ulcerated mid vessel plaque obstructing the RCA by 50 to 60%.  A stent is placed distal to this ulcerated plaque and there is distal stent margin ISR with up to 90% stenosis possibly including thrombus.  Diffuse disease in PDA proximal to mid segment.  Left main has 20% mid body narrowing  LAD is large and bifurcates into a large diagonal and continuation of the LAD.  A previously placed stent contains 30 to 50% mid body ISR.  The LAD is a thread with severe diffuse disease and is not graftable.  I believe the LAD had balloon angioplasty in [place] greater than 10 years ago at the same time as stenting.  The large diagonal contains 50% ostial narrowing and 70% mid vessel narrowing  Circumflex contains severe diffuse disease with each of 3 small to moderate-sized obtuse marginals containing severe proximal disease.  The 2 distal marginals are graftable.  There is a diffusely diseased small ramus intermedius.  LV gram demonstrates apical dyskinesis from prior infarction.  Estimated ejection fraction 40%.  Overall, the patient has severe diffuse three-vessel coronary disease with decreased LV function.  In a diabetic, evaluation for possible surgical revascularization is needed.  Language barrier prevents the kind of discussion that I would like to have with the patient.  If he is a surgical candidate, this would likely be his best treatment option.  There are interventional options for the right coronary if surgery is not offered or accepted.  Will get TCTS opinion.

## 2020-03-17 NOTE — Progress Notes (Signed)
PROGRESS NOTE    Jimmy Blankenship  HFW:[id number] DOB: Mar 21, 1951 DOA: 03/16/2020 PCP: [name], MD   Brief Narrative: Jimmy Blankenship is a 69 y.o. [name] speaking  male with medical history significant of coronary artery disease disease status post stenting, diabetes type 2, hypertension, hyperlipidemia, normocytic anemia who presents to the emergency department at [facility] with complaints of chest pain.  He was sent from [facility] to [facility] for further evaluation due to typical symptoms.  Troponins have been negative.  High suspicion for unstable angina.  Cardiology planning for cardiac cath today.  Also noted to have significant normocytic anemia with positive FOBT.  Consulted GI.  Assessment & Plan:   Principal Problem:   Chest pain Active Problems:   CAD (coronary artery disease)   DM2 (diabetes mellitus, type 2) (HCC)   HTN (hypertension)   HLD (hyperlipidemia)   Normocytic anemia   AKI (acute kidney injury) ([place])    Unstable angina/typical chest pain: Associated with exertion and relieved with nitroglycerin.  Currently chest pain-free.  EKG did not show any ischemic changes.  Showed normal sinus rhythm with occasional PVCs.  Given his high risk and typical presentation, we  requested for cardiac evaluation.  Plan for cardiac cath.  Coronary artery disease: History of stent placement about 11 months ago in [place].  Follows with cardiology at [facility].  Takes Plavix, metoprolol, Lipitor, losartan and they have been restarted.  AKI: His baseline creatinine ranges from 1.1-1.2.  Elevated creatinine on presentation.  Could be associated with prerenal AKI with poor oral intake.  resolved with IV fluids  Normocytic anemia: Baseline hemoglobin in the range of 8.  No history of melena or hematochezia.  He takes Plavix.  Positive stool occult blood.  Low iron as per iron studies.  High suspicion for GI bleed.  He has an appointment with gastroenterology at [facility] on  this morning.  Since FOBT is positive, I will consult gastroenterology here.  Hypertension: Currently blood pressure stable.  Continue current medications.  Monitor blood pressure  Hyperlipidemia: On Lipitor  Diabetes type 2: On glipizide and Metformin at home.  Continue sliding-scale insulin here.  Monitor CBGs.        DVT prophylaxis:SCD Code Status: Full Family Communication: daughter at bed side Status is: Observation  The patient remains OBS appropriate and will d/c before 2 midnights.  Dispo: The patient is from: Home              Anticipated d/c is to: Home              Anticipated d/c date is: 1 day              Patient currently is not medically stable to d/c.   Consultants: Cardiology  Procedures:None  Antimicrobials:  Anti-infectives (From admission, onward)   None      Subjective: Patient seen and examined at the bedside this morning.  Hemodynamically stable.  Comfortable.  Denies any chest pain.  Daughter was concerned that he had some chest discomfort while having bowel movement.  Objective: Vitals:   03/17/20 0046 03/17/20 0413 03/17/20 0759 03/17/20 1111  BP: 100/67 102/67 96/68 109/75  Pulse: 85 74 95 82  Resp: 17 17 18 16   Temp: 98.5 F (36.9 C) 97.7 F (36.5 C) 98.4 F (36.9 C) 98.3 F (36.8 C)  TempSrc: Oral Oral Oral Oral  SpO2: 99% 99% 100% 100%  Weight:      Height:  Intake/Output Summary (Last 24 hours) at 03/17/2020 1302 Last data filed at 03/17/2020 3536 Gross per 24 hour  Intake 944.52 ml  Output 1250 ml  Net -305.48 ml   Filed Weights   03/15/20 2310 03/16/20 1628  Weight: 68 kg 65.9 kg    Examination:  General exam: Appears calm and comfortable ,Not in distress,average built HEENT:PERRL,Oral mucosa moist, Ear/Nose normal on gross exam Respiratory system: Bilateral equal air entry, normal vesicular breath sounds, no wheezes or crackles  Cardiovascular system: S1 & S2 heard, RRR. No JVD, murmurs, rubs, gallops or  clicks. No pedal edema. Gastrointestinal system: Abdomen is nondistended, soft and nontender. No organomegaly or masses felt. Normal bowel sounds heard. Central nervous system: Alert and oriented. No focal neurological deficits. Extremities: No edema, no clubbing ,no cyanosis Skin: No rashes, lesions or ulcers,no icterus ,no pallor   Data Reviewed: I have personally reviewed following labs and imaging studies  CBC: Recent Labs  Lab 03/15/20 2312 03/17/20 0232  WBC 7.8 8.0  NEUTROABS  --  4.2  HGB 8.7* 8.5*  HCT 27.7* 28.1*  MCV 83.4 83.1  PLT 397 144*   Basic Metabolic Panel: Recent Labs  Lab 03/15/20 2312 03/17/20 0232  NA 138 137  K 4.3 4.3  CL 103 105  CO2 23 23  GLUCOSE 164* 106*  BUN 36* 24*  CREATININE 1.40* 1.18  CALCIUM 9.0 8.9   GFR: Estimated Creatinine Clearance: 56.6 mL/min (by C-G formula based on SCr of 1.18 mg/dL). Liver Function Tests: No results for input(s): AST, ALT, ALKPHOS, BILITOT, PROT, ALBUMIN in the last 168 hours. No results for input(s): LIPASE, AMYLASE in the last 168 hours. No results for input(s): AMMONIA in the last 168 hours. Coagulation Profile: No results for input(s): INR, PROTIME in the last 168 hours. Cardiac Enzymes: No results for input(s): CKTOTAL, CKMB, CKMBINDEX, TROPONINI in the last 168 hours. BNP (last 3 results) No results for input(s): PROBNP in the last 8760 hours. HbA1C: No results for input(s): HGBA1C in the last 72 hours. CBG: Recent Labs  Lab 03/16/20 0909 03/16/20 1641 03/16/20 2218 03/17/20 0613 03/17/20 1112  GLUCAP 95 119* 257* 107* 125*   Lipid Profile: No results for input(s): CHOL, HDL, LDLCALC, TRIG, CHOLHDL, LDLDIRECT in the last 72 hours. Thyroid Function Tests: No results for input(s): TSH, T4TOTAL, FREET4, T3FREE, THYROIDAB in the last 72 hours. Anemia Panel: Recent Labs    03/17/20 0232  FERRITIN 8*  TIBC 409  IRON 27*   Sepsis Labs: No results for input(s): PROCALCITON,  LATICACIDVEN in the last 168 hours.  Recent Results (from the past 240 hour(s))  SARS Coronavirus 2 by RT PCR (hospital order, performed in [facility] hospital lab) Nasopharyngeal Nasopharyngeal Swab     Status: None   Collection Time: 03/16/20  2:40 AM   Specimen: Nasopharyngeal Swab  Result Value Ref Range Status   SARS Coronavirus 2 NEGATIVE NEGATIVE Final    Comment: (NOTE) SARS-CoV-2 target nucleic acids are NOT DETECTED.  The SARS-CoV-2 RNA is generally detectable in upper and lower respiratory specimens during the acute phase of infection. The lowest concentration of SARS-CoV-2 viral copies this assay can detect is 250 copies / mL. A negative result does not preclude SARS-CoV-2 infection and should not be used as the sole basis for treatment or other patient management decisions.  A negative result may occur with improper specimen collection / handling, submission of specimen other than nasopharyngeal swab, presence of viral mutation(s) within the areas targeted by this assay,  and inadequate number of viral copies (<250 copies / mL). A negative result must be combined with clinical observations, patient history, and epidemiological information.  Fact Sheet for Patients:   StrictlyIdeas.no  Fact Sheet for Healthcare Providers: BankingDealers.co.za  This test is not yet approved or  cleared by the [place] FDA and has been authorized for detection and/or diagnosis of SARS-CoV-2 by FDA under an Emergency Use Authorization (EUA).  This EUA will remain in effect (meaning this test can be used) for the duration of the COVID-19 declaration under Section 564(b)(1) of the Act, 21 U.S.C. section 360bbb-3(b)(1), unless the authorization is terminated or revoked sooner.  Performed at [facility], [address] [place], [place] [address]   MRSA PCR Screening     Status: None   Collection Time: 03/16/20  5:00 PM    Specimen: Nasal Mucosa; Nasopharyngeal  Result Value Ref Range Status   MRSA by PCR NEGATIVE NEGATIVE Final    Comment:        The GeneXpert MRSA Assay (FDA approved for NASAL specimens only), is one component of a comprehensive MRSA colonization surveillance program. It is not intended to diagnose MRSA infection nor to guide or monitor treatment for MRSA infections. Performed at [facility], [place] [address], [place], [place] [address]          Radiology Studies: DG Chest 2 View  Result Date: 03/15/2020 CLINICAL DATA:  Chest pain.  Palpitations. EXAM: CHEST - 2 VIEW COMPARISON:  None. FINDINGS: The cardiomediastinal contours are normal. Coronary stent is visualized. Pulmonary vasculature is normal. No consolidation, pleural effusion, or pneumothorax. Calcification projecting over the right seventh rib may be a bone island or calcified granuloma, regardless benign. No acute osseous abnormalities are seen. IMPRESSION: 1. No acute chest findings. 2. Coronary stent. Electronically Signed   By: Keith Rake M.D.   On: 03/15/2020 23:32        Scheduled Meds: . atorvastatin  40 mg Oral Daily  . clopidogrel  75 mg Oral Daily  . losartan  50 mg Oral Daily   And  . hydrochlorothiazide  12.5 mg Oral Daily  . insulin aspart  0-5 Units Subcutaneous QHS  . insulin aspart  0-9 Units Subcutaneous TID WC  . metoprolol succinate  25 mg Oral Daily  . sodium chloride flush  3 mL Intravenous Once  . sodium chloride flush  3 mL Intravenous Q12H   Continuous Infusions: . sodium chloride 75 mL/hr at 03/17/20 1008  . sodium chloride    . sodium chloride 1 mL/kg/hr (03/17/20 1254)  . ferumoxytol       LOS: 0 days    Time spent: More than 50% of that time was spent in counseling and/or coordination of care.      Shelly Coss, MD [facility] P7/04/2020, 1:02 PM

## 2020-03-17 NOTE — Progress Notes (Signed)
Delayed infusion of feraheme d/t possible interference if pt needs nuc med stress test. NM tech unsure if it would interfere with exam. Awaiting cardiology to see patient and give guidance. Attending aware. Pt pleasant and stable waiting in bed.

## 2020-03-17 NOTE — Interval H&P Note (Signed)
Cath Lab Visit (complete for each Cath Lab visit)  Clinical Evaluation Leading to the Procedure:   ACS: No.  Non-ACS:    Anginal Classification: CCS III  Anti-ischemic medical therapy: Minimal Therapy (1 class of medications)  Non-Invasive Test Results: No non-invasive testing performed  Prior CABG: No previous CABG      History and Physical Interval Note:  03/17/2020 3:06 PM  Jimmy Blankenship  has presented today for surgery, with the diagnosis of Unstable Angina.  The various methods of treatment have been discussed with the patient and family. After consideration of risks, benefits and other options for treatment, the patient has consented to  Procedure(s): LEFT HEART CATH AND CORONARY ANGIOGRAPHY (N/A) as a surgical intervention.  The patient's history has been reviewed, patient examined, no change in status, stable for surgery.  I have reviewed the patient's chart and labs.  Questions were answered to the patient's satisfaction.     Belva Crome III

## 2020-03-17 NOTE — Consult Note (Addendum)
Referring Provider: Dr. Tawanna Solo Primary Care Physician:  Jimmy Holstein, MD Primary Gastroenterologist:  Jimmy Blankenship   Reason for Consultation:  Anemia, + FOBT  HPI: Jimmy Blankenship is a 69 y.o. male past medical history of hypertension, coronary artery disease s/p stent x 2 and PTCA 2010 done in [place] on Plavix hypercholesterolemia, diabetes, CKD stage III and IDA.  No surgical history. He presented to [facility] emergency room 03/15/2020 with complaints of chest pain, palpitations and shortness of breath. His daughter, Jimmy Blankenship, is present to interpret. She stated her father developed heart racing about one week ago. He developed chest pain with SOB while driving around 7pm on Tues 03/15/2020.  Labs in the ED showed a hemoglobin of 8.7 (Hg 8.9 on 03/08/2020 at [facility]). He was recently referred to a GI with [facility] for further evaluation for IDA, an appointment was scheduled 03/28/2020. He denies having any N/V. No dysphagia or heartburn. No upper or lower abdominal pain. He has intermittent constipation. He passed a normal brown BM earlier this morning. No rectal bleeding or melena. No NSAID use. No alcohol use. He takes Plavix 75mg  daily, last dose was given today at 12/30pm. He underwent a colonoscopy in [place] in 2013 which he reported was normal. No history of GERD, ulcers or GI bleed. No family history of esophageal, gastric or colon cancer. Weight is stable. No fever, sweats or chills.   ED course: Sodium 138.  Potassium 4.3.  Glucose 164.  BUN 36.  Creatinine 1.40.  Calcium 9.0.  WBC 7.8.  Hemoglobin 8.7.  Hematocrit 27.7.  MCV 83.4.  Platelet 397.  Troponin 13.  SARS coronavirus 2 negative.  FOBT positive.  Labs 03/27/2020: Sodium 137.  Potassium 4.3 glucose 106.  BUN 24.  Creatinine 1.18.  Iron 27.  TIBC 409.  Ferritin 8.  WBC 8.0.  Hemoglobin 8.5.  Hematocrit 28.1.  MCV 83.1.  Platelet 403.  Chest xray: 1. No acute chest findings. 2. Coronary stent.   Past Medical History:    Diagnosis Date  . Diabetes mellitus without complication ([place])   . High cholesterol   . Hypertension     Past Surgical History:  Procedure Laterality Date  . CORONARY ANGIOPLASTY WITH STENT PLACEMENT      Prior to Admission medications   Medication Sig Start Date End Date Taking? Authorizing Provider  atorvastatin (LIPITOR) 40 MG tablet Take 40 mg by mouth daily. 03/05/20  Yes [provider]  clopidogrel (PLAVIX) 75 MG tablet Take 75 mg by mouth daily. 12/25/19  Yes [provider]  glipiZIDE (GLUCOTROL) 5 MG tablet Take 5 mg by mouth 2 (two) times daily. 02/10/20  Yes [provider]  losartan-hydrochlorothiazide (HYZAAR) 50-12.5 MG tablet Take 1 tablet by mouth daily. 12/25/19  Yes [provider]  metFORMIN (GLUCOPHAGE) 1000 MG tablet Take 1,000 mg by mouth 2 (two) times daily. 02/04/20  Yes [provider]  metoprolol succinate (TOPROL-XL) 25 MG 24 hr tablet Take 25 mg by mouth daily. 02/29/20  Yes [provider]    Current Facility-Administered Medications  Medication Dose Route Frequency Provider Last Rate Last Admin  . 0.9 %  sodium chloride infusion   Intravenous Continuous Shelly Coss, MD 75 mL/hr at 03/17/20 1008 New Bag at 03/17/20 1008  . 0.9 %  sodium chloride infusion  250 mL Intravenous PRN Kathyrn Drown D, NP      . 0.9% sodium chloride infusion  1 mL/kg/hr Intravenous Continuous Tommie Raymond, NP  65.9 mL/hr at 03/17/20 1254 1 mL/kg/hr at 03/17/20 1254  . atorvastatin (LIPITOR) tablet 40 mg  40 mg Oral Daily Ward, Kristen N, DO   40 mg at 03/16/20 1008  . clopidogrel (PLAVIX) tablet 75 mg  75 mg Oral Daily Ward, Kristen N, DO   75 mg at 03/17/20 1234  . ferumoxytol (FERAHEME) 510 mg in sodium chloride 0.9 % 100 mL IVPB  510 mg Intravenous Once Adhikari, Amrit, MD      . losartan (COZAAR) tablet 50 mg  50 mg Oral Daily Opyd, Ilene Qua, MD   50 mg at 03/16/20 1008   And  . hydrochlorothiazide (MICROZIDE) capsule  12.5 mg  12.5 mg Oral Daily Opyd, Ilene Qua, MD   12.5 mg at 03/16/20 1825  . insulin aspart (novoLOG) injection 0-5 Units  0-5 Units Subcutaneous QHS Shelly Coss, MD   3 Units at 03/16/20 2231  . insulin aspart (novoLOG) injection 0-9 Units  0-9 Units Subcutaneous TID WC Adhikari, Amrit, MD      . metoprolol succinate (TOPROL-XL) 24 hr tablet 25 mg  25 mg Oral Daily Ward, Kristen N, DO   25 mg at 03/16/20 1007  . nitroGLYCERIN (NITROSTAT) SL tablet 0.4 mg  0.4 mg Sublingual Q5 min PRN Ward, Kristen N, DO      . sodium chloride flush (NS) 0.9 % injection 3 mL  3 mL Intravenous Once Ward, Kristen N, DO      . sodium chloride flush (NS) 0.9 % injection 3 mL  3 mL Intravenous Q12H Kathyrn Drown D, NP   3 mL at 03/17/20 1236  . sodium chloride flush (NS) 0.9 % injection 3 mL  3 mL Intravenous PRN Tommie Raymond, NP        Allergies as of 03/15/2020  . (No Known Allergies)    No family history on file.  Social History   Socioeconomic History  . Marital status: Married    Spouse name: Not on file  . Number of children: Not on file  . Years of education: Not on file  . Highest education level: Not on file  Occupational History  . Not on file  Tobacco Use  . Smoking status: Former Research scientist (life sciences)  . Smokeless tobacco: Never Used  Vaping Use  . Vaping Use: Never used  Substance and Sexual Activity  . Alcohol use: Never  . Drug use: Never  . Sexual activity: Not on file  Other Topics Concern  . Not on file  Social History Narrative  . Not on file   Social Determinants of Health   Financial Resource Strain:   . Difficulty of Paying Living Expenses:   Food Insecurity:   . Worried About Charity fundraiser in the Last Year:   . Arboriculturist in the Last Year:   Transportation Needs:   . Film/video editor (Medical):   Marland Kitchen Lack of Transportation (Non-Medical):   Physical Activity:   . Days of Exercise per Week:   . Minutes of Exercise per Session:   Stress:   . Feeling of  Stress :   Social Connections:   . Frequency of Communication with Friends and Family:   . Frequency of Social Gatherings with Friends and Family:   . Attends Religious Services:   . Active Member of Clubs or Organizations:   . Attends Archivist Meetings:   Marland Kitchen Marital Status:   Intimate Partner Violence:   . Fear of Current or Ex-Partner:   .  Emotionally Abused:   Marland Kitchen Physically Abused:   . Sexually Abused:     Review of Systems: See HPI, all other systems reviewed and are negative   Physical Exam: Vital signs in last 24 hours: Temp:  [97.7 F (36.5 C)-99 F (37.2 C)] 98.3 F (36.8 C) (07/08 1111) Pulse Rate:  [74-108] 82 (07/08 1111) Resp:  [14-19] 16 (07/08 1111) BP: (96-127)/(63-75) 109/75 (07/08 1111) SpO2:  [99 %-100 %] 100 % (07/08 1111) Weight:  [65.9 kg] 65.9 kg (07/07 1628) Last BM Date: 03/16/20 General:  Alert,  well-developed, well-nourished, pleasant and cooperative in NAD. Head:  Normocephalic and atraumatic. Eyes:  No scleral icterus. Conjunctiva pink. Ears:  Normal auditory acuity. Nose:  No deformity, discharge or lesions. Mouth:  Dentition intact. No ulcers or lesions.  Neck:  Supple. No lymphadenopathy or thyromegaly.  Lungs:  Breath sounds clear throughout.  Heart:  RRR, no murmurs.  Abdomen:  Soft, nontender. Nondistended. + BS x 4 quads. No HSM. Rectal: Deferred. FOBT +.  Musculoskeletal:  Symmetrical without gross deformities.  Pulses:  Normal pulses noted. Extremities:  Without clubbing or edema. Neurologic:  Alert and  oriented x4. No focal deficits.  Skin:  Intact without significant lesions or rashes. Psych:  Alert and cooperative. Normal mood and affect.  Intake/Output from previous day: 07/07 0701 - 07/08 0700 In: 944.5 [P.O.:240; I.V.:704.5] Out: 1250 [Urine:1250] Intake/Output this shift: No intake/output data recorded.  Lab Results: Recent Labs    03/15/20 2312 03/17/20 0232  WBC 7.8 8.0  HGB 8.7* 8.5*  HCT 27.7*  28.1*  PLT 397 403*   BMET Recent Labs    03/15/20 2312 03/17/20 0232  NA 138 137  K 4.3 4.3  CL 103 105  CO2 23 23  GLUCOSE 164* 106*  BUN 36* 24*  CREATININE 1.40* 1.18  CALCIUM 9.0 8.9   LFT No results for input(s): PROT, ALBUMIN, AST, ALT, ALKPHOS, BILITOT, BILIDIR, IBILI in the last 72 hours. PT/INR No results for input(s): LABPROT, INR in the last 72 hours. Hepatitis Panel No results for input(s): HEPBSAG, HCVAB, HEPAIGM, HEPBIGM in the last 72 hours.    Studies/Results: DG Chest 2 View  Result Date: 03/15/2020 CLINICAL DATA:  Chest pain.  Palpitations. EXAM: CHEST - 2 VIEW COMPARISON:  None. FINDINGS: The cardiomediastinal contours are normal. Coronary stent is visualized. Pulmonary vasculature is normal. No consolidation, pleural effusion, or pneumothorax. Calcification projecting over the right seventh rib may be a bone island or calcified granuloma, regardless benign. No acute osseous abnormalities are seen. IMPRESSION: 1. No acute chest findings. 2. Coronary stent. Electronically Signed   By: Keith Rake M.D.   On: 03/15/2020 23:32    IMPRESSION/PLAN:  36.  69 year old [name] male with a history of coronary artery disease status post stent placement x 2 and PTCA in 2010 on Plavix admitted to the hospital with IDA and + FOBT.  He received Plavix today at 12:30 pm. Feraheme IV x 1 dose ordered by the hospitalist. Patient scheduled for a cardiac catheterization at 2pm today.  -Transfuse for hemoglobin less than 8 -Repeat CBC in a.m. -Cardiac consult with cardiac clearance required prior to proceeding with EGD and colonoscopy, await results of cardiac catheterization  -Pantoprazole 40 mg IV every 24 hours -Continue to monitor patient for GI active GI bleeding -Further recommendations per Dr. Lyndel Safe  2. DM II    Noralyn Pick  03/17/2020, 1:15 PM     Attending physician's note   I have taken an interval  history, reviewed the chart and examined the  patient. I agree with the Advanced Practitioner's note, impression and recommendations.   Daughter acted as Astronomer.  52yr old [name] male with CAD s/p PTCA on plavix, DM2, HTN, HLD, CKD2 with underwent cath today for abn stress test. Found to have severe triple-vessel disease.  Being considered for CABG.  IDA with FOBT+ with chronic anemia. Neg colon 71yr ago in [place]. Has appt with [facility].  No active bleeding even on Plavix.  Plan: -Recommend to continue Protonix.  -Trend CBC.  Transfuse as needed. -Looks like he would need CABG. -GI WU when cleared by cardiology, as outpt.  -If any active bleeding, would reconsider. -Please call if with any ?/Change in clinical status.  Carmell Austria, MD [name] Fabienne Bruns [phone].

## 2020-03-17 NOTE — Progress Notes (Signed)
TCTS consulted for CABG evaluation. °

## 2020-03-17 NOTE — H&P (View-Only) (Signed)
Cardiology Consultation:   Patient ID: Jimmy Blankenship; [id number]; 14-Feb-1951   Admit date: 03/16/2020 Date of Consult: 03/17/2020  Primary Care Provider: [name], MD Primary Cardiologist: [facility] (Dr. Maudie Mercury)  Patient Profile:   Jimmy Blankenship is a 69 y.o. male with a hx of CAD s/p PCIx 2 (approximately 11 years ago in [place]-approximately 2010), DM2, hypertension, hyperlipidemia and remote smoking who is being seen today for the evaluation of abnormal stress test at the request of Dr. Tawanna Solo.  History of Present Illness:   Jimmy Blankenship is a 69 year old male with a history stated above who presented to [facility] on 03/16/2020 with complaints of chest pain. HPI obtained through the use of remote interpreter and daughter at bedside. Patient reports he began having chest pain symptoms approximately 1 week ago however his symptoms worsened 2 days prior to presentation while driving home from work.  He reports he had stent placement in [place] approximately 10 years ago and has not had any issue with anginal symptoms since that time.  He follows with Dr. Maudie Mercury at [facility] and also with a cardiologist in [place] when he returns annually.  During his episode, he states he was fairly short of breath with no pain radiation.  He took 1 NTG spray with complete relief however given his cardiac history, he presented to outside ED for further evaluation.   He reports his very active at baseline and has had no complication or issues completing tasks however more recently does state things have become a little more difficult mainly with shortness of breath and what sounds like orthopnea symptoms.  He has been evaluated on several occasions for palpitations as below, monitor revealed no atrial fibrillation or other arrhythmias.  Denies recent illness with fever or chills.  Denies LE edema, dizziness or syncope.  On presentation, EKG with no acute ischemic changes with NSR and occasional PVCs.  HST found  to be 13>> 12>> 13 not consistent with ACS.  He was given ASA and nitroglycerin on arrival and has not had recurrent chest pain symptoms since this time.  Given his typical anginal presentation and history of CAD he was transferred to [facility] for further evaluation.  He was initially planned to go to [place] however there were no beds available.  Per chart review, he is followed by Dr. Maudie Mercury with [facility] for his cardiology care.  He underwent an echocardiogram 07/2019 which showed an LVEF of 50 to 55% with severe hypokinesis to akinesis of the distal septal and apical segments with moderate MR.  He was last seen by Dr. Maudie Mercury 08/24/2019 for the evaluation of palpitations at which time a Holter monitor was placed.  Results 09/25/2019 showed no evidence of tacky or bradycardia arrhythmias, no pausing greater than 2.5 seconds, no atrial fibrillation.    Past Medical History:  Diagnosis Date  . Diabetes mellitus without complication ([place])   . High cholesterol   . Hypertension     Past Surgical History:  Procedure Laterality Date  . CORONARY ANGIOPLASTY WITH STENT PLACEMENT       Prior to Admission medications   Medication Sig Start Date End Date Taking? Authorizing Provider  atorvastatin (LIPITOR) 40 MG tablet Take 40 mg by mouth daily. 03/05/20  Yes [provider]  clopidogrel (PLAVIX) 75 MG tablet Take 75 mg by mouth daily. 12/25/19  Yes [provider]  glipiZIDE (GLUCOTROL) 5 MG tablet Take 5 mg by mouth 2 (two) times daily.  02/10/20  Yes [provider]  losartan-hydrochlorothiazide (HYZAAR) 50-12.5 MG tablet Take 1 tablet by mouth daily. 12/25/19  Yes [provider]  metFORMIN (GLUCOPHAGE) 1000 MG tablet Take 1,000 mg by mouth 2 (two) times daily. 02/04/20  Yes [provider]  metoprolol succinate (TOPROL-XL) 25 MG 24 hr tablet Take 25 mg by mouth daily. 02/29/20  Yes [provider]    Inpatient Medications: Scheduled Meds: . atorvastatin   40 mg Oral Daily  . clopidogrel  75 mg Oral Daily  . enoxaparin (LOVENOX) injection  40 mg Subcutaneous Q24H  . losartan  50 mg Oral Daily   And  . hydrochlorothiazide  12.5 mg Oral Daily  . insulin aspart  0-5 Units Subcutaneous QHS  . insulin aspart  0-9 Units Subcutaneous TID WC  . metoprolol succinate  25 mg Oral Daily  . sodium chloride flush  3 mL Intravenous Once   Continuous Infusions: . sodium chloride 75 mL/hr at 03/17/20 1008  . ferumoxytol     PRN Meds: nitroGLYCERIN  Allergies:   No Known Allergies  Social History:   Social History   Socioeconomic History  . Marital status: Married    Spouse name: Not on file  . Number of children: Not on file  . Years of education: Not on file  . Highest education level: Not on file  Occupational History  . Not on file  Tobacco Use  . Smoking status: Former Research scientist (life sciences)  . Smokeless tobacco: Never Used  Vaping Use  . Vaping Use: Never used  Substance and Sexual Activity  . Alcohol use: Never  . Drug use: Never  . Sexual activity: Not on file  Other Topics Concern  . Not on file  Social History Narrative  . Not on file   Social Determinants of Health   Financial Resource Strain:   . Difficulty of Paying Living Expenses:   Food Insecurity:   . Worried About Charity fundraiser in the Last Year:   . Arboriculturist in the Last Year:   Transportation Needs:   . Film/video editor (Medical):   Marland Kitchen Lack of Transportation (Non-Medical):   Physical Activity:   . Days of Exercise per Week:   . Minutes of Exercise per Session:   Stress:   . Feeling of Stress :   Social Connections:   . Frequency of Communication with Friends and Family:   . Frequency of Social Gatherings with Friends and Family:   . Attends Religious Services:   . Active Member of Clubs or Organizations:   . Attends Archivist Meetings:   Marland Kitchen Marital Status:   Intimate Partner Violence:   . Fear of Current or Ex-Partner:   . Emotionally  Abused:   Marland Kitchen Physically Abused:   . Sexually Abused:     Family History:   No family history on file. Family Status:  No family status information on file.    ROS:  Please see the history of present illness.  All other ROS reviewed and negative.     Physical Exam/Data:   Vitals:   03/16/20 1914 03/17/20 0046 03/17/20 0413 03/17/20 0759  BP: 116/68 100/67 102/67 96/68  Pulse: (!) 108 85 74 95  Resp: 18 17 17 18   Temp: 99 F (37.2 C) 98.5 F (36.9 C) 97.7 F (36.5 C) 98.4 F (36.9 C)  TempSrc: Oral Oral Oral Oral  SpO2: 99% 99% 99% 100%  Weight:      Height:  Intake/Output Summary (Last 24 hours) at 03/17/2020 1034 Last data filed at 03/17/2020 0623 Gross per 24 hour  Intake 944.52 ml  Output 1250 ml  Net -305.48 ml   Filed Weights   03/15/20 2310 03/16/20 1628  Weight: 68 kg 65.9 kg   Body mass index is 22.09 kg/m.   General: Well developed, well nourished, NAD Skin: Warm, dry, intact  Neck: Negative for carotid bruits. No JVD Lungs:Clear to ausculation bilaterally. No wheezes, rales, or rhonchi. Breathing is unlabored. Cardiovascular: RRR with S1 S2. No murmurs Abdomen: Soft, non-tender, non-distended. No obvious abdominal masses. Extremities: No edema. Radial pulses 2+ bilaterally Neuro: Alert and oriented. No focal deficits. No facial asymmetry. MAE spontaneously. Psych: Responds to questions appropriately with normal affect.     EKG:  The EKG was personally reviewed and demonstrates: 03/15/2020 NSR with nonspecific T wave inversions, early repolarization in leads V2-V3 with no acute ischemic changes Telemetry:  Telemetry was personally reviewed and demonstrates: 03/17/2020 NSR  Relevant CV Studies:  Echo 07/2019 shows EF of 50 to 55% but with severe hypo to akinesis of the distal septal and apical segments, moderate MR  Cardiac monitor 09/25/2019: This is a technically adequate study.  The patient monitored for a period of 48 hours. The rhythm strips  demonstrated an underlying sinus rhythm.     The minimum heart rate was 63 beats per minute.  The average heart rate was 81 beats per minute.   The maximum heart rate was 120 beats per minute.    Occasional to frequent PACs and rare PACs noted.  PAC burden was 11.8%.    No evidence of tachy or brady arrhythmia identified in this study .   No pauses > 2.5 seconds were noted on this study.    No atrial fibrillation noted on this study.    No dairy submitted.     Laboratory Data:  Chemistry Recent Labs  Lab 03/15/20 2312 03/17/20 0232  NA 138 137  K 4.3 4.3  CL 103 105  CO2 23 23  GLUCOSE 164* 106*  BUN 36* 24*  CREATININE 1.40* 1.18  CALCIUM 9.0 8.9  GFRNONAA 52* >60  GFRAA 60* >60  ANIONGAP 12 9    No results found for: PROT, ALBUMIN, AST, ALT, ALKPHOS, BILITOT Hematology Recent Labs  Lab 03/15/20 2312 03/17/20 0232  WBC 7.8 8.0  RBC 3.32* 3.38*  HGB 8.7* 8.5*  HCT 27.7* 28.1*  MCV 83.4 83.1  MCH 26.2 25.1*  MCHC 31.4 30.2  RDW 14.0 13.8  PLT 397 403*   Cardiac EnzymesNo results for input(s): TROPONINI in the last 168 hours. No results for input(s): TROPIPOC in the last 168 hours.  BNPNo results for input(s): BNP, PROBNP in the last 168 hours.  DDimer No results for input(s): DDIMER in the last 168 hours. TSH: No results found for: TSH Lipids:No results found for: CHOL, HDL, LDLCALC, LDLDIRECT, TRIG, CHOLHDL HgbA1c:No results found for: HGBA1C  Radiology/Studies:  DG Chest 2 View  Result Date: 03/15/2020 CLINICAL DATA:  Chest pain.  Palpitations. EXAM: CHEST - 2 VIEW COMPARISON:  None. FINDINGS: The cardiomediastinal contours are normal. Coronary stent is visualized. Pulmonary vasculature is normal. No consolidation, pleural effusion, or pneumothorax. Calcification projecting over the right seventh rib may be a bone island or calcified granuloma, regardless benign. No acute osseous abnormalities are seen. IMPRESSION: 1. No acute chest  findings. 2. Coronary stent. Electronically Signed   By: Keith Rake M.D.   On: 03/15/2020 23:32   Assessment  and Plan:   1.  Chest pain with known history of CAD: -Patient reports having stent placement approximately 11 years ago while in [place] and follows with cardiology at [place]. PTA medications include Plavix, metoprolol, Lipitor and losartan.  Patient was on his way home from work 2 days ago when he began having typical unstable anginal symptoms with anterior left-sided chest pain with associated shortness of breath and no pain radiation.  He has had no exertional or resting chest pain.  Does report some moderate fatigue which is new for him over the past month.  Since stent placement, has had no unstable anginal symptoms despite the duration since last evaluation.  He underwent an echocardiogram for Dr. Maudie Mercury 07/2019 which was found to be normal.  He wore a cardiac monitor 09/2019 for the evaluation of palpitations which was found to be normal with no arrhythmias. -hsT found to be minimally elevated at 12>> 13>> 12 not necessarily consistent with ACS -EKG with sinus tachycardia, HR 108 bpm with nonspecific T wave abnormalities and early repolarization in leads V2-3 -On PTA atorvastatin 40, Plavix 75, losartan 50, Toprol 25 -Plan for risk factor stratification with lipid panel and hemoglobin A1c -Would pursue further ischemic evaluation given duration since last cardiac evaluation and symptoms similar to prior cardiac episodes from 11 years ago.  We have no documentation of patient's coronary anatomy and stent placement therefore would opt for LHC for complete, definitive assessment despite rather negative work-up thus far. -Cardiac catheterization was discussed with the patient fully. The patient understands that risks include but are not limited to stroke (1 in 1000), death (1 in 16), kidney failure [usually temporary] (1 in 500), bleeding (1 in 200), allergic reaction [possibly serious] (1 in  200).  The patient understands and is willing to proceed.    2.  Hypertension: -Stable, 109/75, 96/68, 102/67 -Continue losartan 50, Toprol 25  3.  HLD: -Not on file epic -Continue atorvastatin  4.  DM2: -Hemoglobin A1c, pending -SSI for glucose control -Per primary team  5.  AKI: -Stabilizing, 1.18 today down from 1.40 on 03/15/2020 -Baseline appears to be in the 1.1-1.2 range -Felt to be in the setting of poor oral intake -Follow with daily labs  6.  Anemia: -Hemoglobin, 8.5 -No history of melena, hematuria   For questions or updates, please contact [place] Please consult [email or web address] for contact info under Cardiology/STEMI.   Lyndel Safe NP-C [place] Pager: [phone] 03/17/2020 10:34 AM   Attending Note:   The patient was seen and examined.  Agree with assessment and plan as noted above.  Changes made to the above note as needed.  Patient seen and independently examined with Kathyrn Drown, NP .   We discussed all aspects of the encounter. I agree with the assessment and plan as stated above.  1.  Unstable angina: Jimmy Blankenship presents with symptoms that are fairly identical to his previous episodes of angina.  Is described as a chest pain chest pressure.  Lasted for about 10 minutes and then was promptly relieved with 1 spray of sublingual nitroglycerin spray.  He has had a month of progressive fatigue and a little bit more shortness of breath with exertion.  These are consistent with recurrent unstable angina.  I think her best option is to proceed with heart catheterization. His EKG reveals Q waves in the anterior leads with question of minimal ST elevation.  I do not have an old EKG to compare.  He also has Q waves in  the inferior leads.  With the help of his daughter interpreting, we have discussed the risks, benefits, options of heart catheterization.  He understands and agrees to proceed.  We will try to get an formal interpreter to help [place] in  the Cath Lab during the procedure.  2.  Hypertension: Continue current medications.  3.  Diabetes mellitus: Continue current medications.    I have spent a total of 40 minutes with patient reviewing hospital  notes , telemetry, EKGs, labs and examining patient as well as establishing an assessment and plan that was discussed with the patient. > 50% of time was spent in direct patient care.    Thayer Headings, Brooke Bonito., MD, [facility] 03/17/2020, 11:34 AM [address]  [place] Pager [phone]

## 2020-03-17 NOTE — Progress Notes (Signed)
TR Band removed and pressure dressing applied. Pt teaching completed and verbalized understanding with teachback. Will continue to monitor.

## 2020-03-18 ENCOUNTER — Encounter (HOSPITAL_COMMUNITY): Payer: Self-pay | Admitting: Interventional Cardiology

## 2020-03-18 ENCOUNTER — Observation Stay (HOSPITAL_COMMUNITY): Payer: Medicare Other

## 2020-03-18 DIAGNOSIS — Z79899 Other long term (current) drug therapy: Secondary | ICD-10-CM | POA: Diagnosis not present

## 2020-03-18 DIAGNOSIS — I129 Hypertensive chronic kidney disease with stage 1 through stage 4 chronic kidney disease, or unspecified chronic kidney disease: Secondary | ICD-10-CM | POA: Diagnosis present

## 2020-03-18 DIAGNOSIS — E782 Mixed hyperlipidemia: Secondary | ICD-10-CM | POA: Diagnosis not present

## 2020-03-18 DIAGNOSIS — N179 Acute kidney failure, unspecified: Secondary | ICD-10-CM | POA: Diagnosis present

## 2020-03-18 DIAGNOSIS — Z01810 Encounter for preprocedural cardiovascular examination: Secondary | ICD-10-CM | POA: Diagnosis not present

## 2020-03-18 DIAGNOSIS — I25110 Atherosclerotic heart disease of native coronary artery with unstable angina pectoris: Secondary | ICD-10-CM | POA: Diagnosis present

## 2020-03-18 DIAGNOSIS — E8770 Fluid overload, unspecified: Secondary | ICD-10-CM | POA: Diagnosis present

## 2020-03-18 DIAGNOSIS — Z955 Presence of coronary angioplasty implant and graft: Secondary | ICD-10-CM | POA: Diagnosis not present

## 2020-03-18 DIAGNOSIS — R079 Chest pain, unspecified: Secondary | ICD-10-CM | POA: Diagnosis present

## 2020-03-18 DIAGNOSIS — I493 Ventricular premature depolarization: Secondary | ICD-10-CM | POA: Diagnosis present

## 2020-03-18 DIAGNOSIS — Z20822 Contact with and (suspected) exposure to covid-19: Secondary | ICD-10-CM | POA: Diagnosis present

## 2020-03-18 DIAGNOSIS — Z87891 Personal history of nicotine dependence: Secondary | ICD-10-CM | POA: Diagnosis not present

## 2020-03-18 DIAGNOSIS — Z7902 Long term (current) use of antithrombotics/antiplatelets: Secondary | ICD-10-CM | POA: Diagnosis not present

## 2020-03-18 DIAGNOSIS — N183 Chronic kidney disease, stage 3 unspecified: Secondary | ICD-10-CM | POA: Diagnosis present

## 2020-03-18 DIAGNOSIS — E1122 Type 2 diabetes mellitus with diabetic chronic kidney disease: Secondary | ICD-10-CM | POA: Diagnosis present

## 2020-03-18 DIAGNOSIS — E119 Type 2 diabetes mellitus without complications: Secondary | ICD-10-CM

## 2020-03-18 DIAGNOSIS — Z7984 Long term (current) use of oral hypoglycemic drugs: Secondary | ICD-10-CM | POA: Diagnosis not present

## 2020-03-18 DIAGNOSIS — E1159 Type 2 diabetes mellitus with other circulatory complications: Secondary | ICD-10-CM | POA: Diagnosis not present

## 2020-03-18 DIAGNOSIS — I255 Ischemic cardiomyopathy: Secondary | ICD-10-CM | POA: Diagnosis present

## 2020-03-18 DIAGNOSIS — I6523 Occlusion and stenosis of bilateral carotid arteries: Secondary | ICD-10-CM | POA: Diagnosis not present

## 2020-03-18 DIAGNOSIS — E785 Hyperlipidemia, unspecified: Secondary | ICD-10-CM | POA: Diagnosis present

## 2020-03-18 DIAGNOSIS — R195 Other fecal abnormalities: Secondary | ICD-10-CM | POA: Diagnosis present

## 2020-03-18 LAB — URINALYSIS, ROUTINE W REFLEX MICROSCOPIC
Bilirubin Urine: NEGATIVE
Glucose, UA: >=500 mg/dL — AB
Hgb urine dipstick: NEGATIVE
Ketones, ur: NEGATIVE mg/dL
Leukocytes,Ua: NEGATIVE
Nitrite: NEGATIVE
Protein, ur: NEGATIVE mg/dL
Specific Gravity, Urine: 1.011 (ref 1.005–1.030)
pH: 5 (ref 5.0–8.0)

## 2020-03-18 LAB — BASIC METABOLIC PANEL
Anion gap: 6 (ref 5–15)
BUN: 16 mg/dL (ref 8–23)
CO2: 22 mmol/L (ref 22–32)
Calcium: 8 mg/dL — ABNORMAL LOW (ref 8.9–10.3)
Chloride: 109 mmol/L (ref 98–111)
Creatinine, Ser: 1.17 mg/dL (ref 0.61–1.24)
GFR calc Af Amer: >60 mL/min (ref >60)
GFR calc non Af Amer: >60 mL/min (ref >60)
Glucose, Bld: 222 mg/dL — ABNORMAL HIGH (ref 70–99)
Potassium: 3.9 mmol/L (ref 3.5–5.1)
Sodium: 137 mmol/L (ref 135–145)

## 2020-03-18 LAB — HEPARIN LEVEL (UNFRACTIONATED): Heparin Unfractionated: 0.3 IU/mL (ref 0.30–0.70)

## 2020-03-18 LAB — GLUCOSE, CAPILLARY
Glucose-Capillary: 113 mg/dL — ABNORMAL HIGH (ref 70–99)
Glucose-Capillary: 172 mg/dL — ABNORMAL HIGH (ref 70–99)
Glucose-Capillary: 182 mg/dL — ABNORMAL HIGH (ref 70–99)
Glucose-Capillary: 199 mg/dL — ABNORMAL HIGH (ref 70–99)

## 2020-03-18 LAB — CBC WITH DIFFERENTIAL/PLATELET
Abs Immature Granulocytes: 0.02 10*3/uL (ref 0.00–0.07)
Basophils Absolute: 0 10*3/uL (ref 0.0–0.1)
Basophils Relative: 1 %
Eosinophils Absolute: 0.1 10*3/uL (ref 0.0–0.5)
Eosinophils Relative: 2 %
HCT: 24.6 % — ABNORMAL LOW (ref 39.0–52.0)
Hemoglobin: 7.7 g/dL — ABNORMAL LOW (ref 13.0–17.0)
Immature Granulocytes: 0 %
Lymphocytes Relative: 20 %
Lymphs Abs: 1 10*3/uL (ref 0.7–4.0)
MCH: 26 pg (ref 26.0–34.0)
MCHC: 31.3 g/dL (ref 30.0–36.0)
MCV: 83.1 fL (ref 80.0–100.0)
Monocytes Absolute: 0.5 10*3/uL (ref 0.1–1.0)
Monocytes Relative: 10 %
Neutro Abs: 3.6 10*3/uL (ref 1.7–7.7)
Neutrophils Relative %: 67 %
Platelets: 351 10*3/uL (ref 150–400)
RBC: 2.96 MIL/uL — ABNORMAL LOW (ref 4.22–5.81)
RDW: 13.8 % (ref 11.5–15.5)
WBC: 5.3 10*3/uL (ref 4.0–10.5)
nRBC: 0 % (ref 0.0–0.2)

## 2020-03-18 LAB — CBC
HCT: 27.9 % — ABNORMAL LOW (ref 39.0–52.0)
Hemoglobin: 8.8 g/dL — ABNORMAL LOW (ref 13.0–17.0)
MCH: 26.3 pg (ref 26.0–34.0)
MCHC: 31.5 g/dL (ref 30.0–36.0)
MCV: 83.3 fL (ref 80.0–100.0)
Platelets: 325 10*3/uL (ref 150–400)
RBC: 3.35 MIL/uL — ABNORMAL LOW (ref 4.22–5.81)
RDW: 14 % (ref 11.5–15.5)
WBC: 5.2 10*3/uL (ref 4.0–10.5)
nRBC: 0 % (ref 0.0–0.2)

## 2020-03-18 LAB — ABO/RH: ABO/RH(D): A POS

## 2020-03-18 LAB — SURGICAL PCR SCREEN
MRSA, PCR: NEGATIVE
Staphylococcus aureus: NEGATIVE

## 2020-03-18 LAB — ECHOCARDIOGRAM COMPLETE
Height: 68 in
Weight: 2324.53 oz

## 2020-03-18 LAB — PREPARE RBC (CROSSMATCH)

## 2020-03-18 MED ORDER — MAGNESIUM HYDROXIDE 400 MG/5ML PO SUSP
30.0000 mL | Freq: Every day | ORAL | Status: DC | PRN
  Administered 2020-03-18 – 2020-03-20 (×2): 30 mL via ORAL
  Filled 2020-03-18 (×3): qty 30

## 2020-03-18 MED ORDER — PERFLUTREN LIPID MICROSPHERE
1.0000 mL | INTRAVENOUS | Status: AC | PRN
  Administered 2020-03-18: 4 mL via INTRAVENOUS
  Filled 2020-03-18: qty 10

## 2020-03-18 MED ORDER — SODIUM CHLORIDE 0.9% IV SOLUTION: Freq: Once | INTRAVENOUS | Status: AC

## 2020-03-18 MED ORDER — SODIUM CHLORIDE 0.9 % IV SOLN
510.0000 mg | Freq: Once | INTRAVENOUS | Status: AC
  Administered 2020-03-18: 510 mg via INTRAVENOUS
  Filled 2020-03-18: qty 17

## 2020-03-18 NOTE — Progress Notes (Signed)
  Echocardiogram 2D Echocardiogram has been performed.  Jimmy Blankenship 03/18/2020, 10:14 AM

## 2020-03-18 NOTE — Progress Notes (Signed)
RN called phlebotomy to draw labs due for pt. Phlebotomist told RN she would get pts labs as soon as possible as she had multiple STAT orders.

## 2020-03-18 NOTE — Progress Notes (Addendum)
PROGRESS NOTE    Jimmy Blankenship  DJS:[id number] DOB: 13-Sep-1950 DOA: 03/16/2020 PCP: [name], MD   Brief Narrative: Jimmy Blankenship is a 69 y.o. [name] speaking  male with medical history significant of coronary artery disease disease status post stenting, diabetes type 2, hypertension, hyperlipidemia, normocytic anemia who presents to the emergency department at [facility] with complaints of chest pain.  He was sent from [facility] to [facility] for further evaluation due to typical symptoms.  Troponins were negative.  High suspicion for unstable angina.    Also noted to have significant normocytic anemia with positive FOBT.  He underwent cardiac cath with finding of severe triple-vessel disease.  Cardiothoracic surgery consulted for possible CABG.  Assessment & Plan:   Principal Problem:   Nonspecific chest pain Active Problems:   CAD (coronary artery disease)   DM2 (diabetes mellitus, type 2) (HCC)   HTN (hypertension)   HLD (hyperlipidemia)   Normocytic anemia   AKI (acute kidney injury) ([place])    Unstable angina/typical chest pain: Associated with exertion and relieved with nitroglycerin.  Currently chest pain-free.  EKG did not show any ischemic changes.  Showed normal sinus rhythm with occasional PVCs.  On heparin drip  Coronary artery disease: History of stent placement about 11 years  ago in [place].  Follows with cardiology at [facility].  Takes Plavix, metoprolol, Lipitor, losartan .  Cardiac cath done here which showed severe diffuse three-vessel coronary disease with decreased left ventricular function with ejection fraction of 40%.  Cardiology recommending possible surgical revascularization.  Cardiothoracic surgery consulted. Added aspirin.  Echocardiogram pending.  Currently he is euvolemic.  AKI: His baseline creatinine ranges from 1.1-1.2.  Elevated creatinine on presentation.  Could be associated with prerenal AKI with poor oral intake.  resolved with IV  fluids  Normocytic anemia: Baseline hemoglobin in the range of 7.  No history of melena or hematochezia.  He takes Plavix.  Positive stool occult blood.  Low iron as per iron studies.  High suspicion for GI bleed.  He has an appointment with gastroenterology at [facility].  GI consulted here and recommended to follow-up with his gastroenterologist as an outpatient for further work-up, no EGD/Colo planned here.  Recommended to continue Protonix. S/P a dose of IV iron.  We will give a unit of PRBC transfusion for anemia of the background of heart disease.  Hypertension: Currently blood pressure stable.  Continue current medications.  Monitor blood pressure  Hyperlipidemia: On Lipitor  Diabetes type 2: On glipizide and Metformin at home.  Continue sliding-scale insulin here.  Monitor CBGs.        DVT prophylaxis:SCD Code Status: Full Family Communication: daughter at bed side Status is: Observation  The patient remains OBS appropriate and will d/c before 2 midnights.  Dispo: The patient is from: Home              Anticipated d/c is to: Home              Anticipated d/c date is: 3-4 days              Patient currently is not medically stable to d/c.   Consultants: Cardiology, gastroenterology, cardiothoracic surgery  Procedures:None  Antimicrobials:  Anti-infectives (From admission, onward)   None      Subjective: Patient seen and examined at the bedside this morning.  Comfortable.  Hemodynamically stable.  Denies any chest pain.  Daughter was at the bedside.  Objective: Vitals:  03/17/20 2339 03/18/20 0300 03/18/20 0330 03/18/20 0734  BP: 112/70  116/75 132/64  Pulse: 95 84 (!) 103 (!) 109  Resp: (!) 25 16 16 14   Temp: 98.2 F (36.8 C)  98.2 F (36.8 C) 97.9 F (36.6 C)  TempSrc: Oral  Oral Oral  SpO2: 100% 98% 99% 97%  Weight:      Height:        Intake/Output Summary (Last 24 hours) at 03/18/2020 0737 Last data filed at 03/18/2020 0331 Gross per 24 hour  Intake  1968.36 ml  Output 1050 ml  Net 918.36 ml   Filed Weights   03/15/20 2310 03/16/20 1628  Weight: 68 kg 65.9 kg    Examination:  General exam: Appears calm and comfortable ,Not in distress,average built HEENT:PERRL,Oral mucosa moist, Ear/Nose normal on gross exam Respiratory system: Bilateral equal air entry, normal vesicular breath sounds, no wheezes or crackles  Cardiovascular system: S1 & S2 heard, RRR. No JVD, murmurs, rubs, gallops or clicks. Gastrointestinal system: Abdomen is nondistended, soft and nontender. No organomegaly or masses felt. Normal bowel sounds heard. Central nervous system: Alert and oriented. No focal neurological deficits. Extremities: No edema, no clubbing ,no cyanosis Skin: No rashes, lesions or ulcers,no icterus ,no pallor  Data Reviewed: I have personally reviewed following labs and imaging studies  CBC: Recent Labs  Lab 03/15/20 2312 03/17/20 0232  WBC 7.8 8.0  NEUTROABS  --  4.2  HGB 8.7* 8.5*  HCT 27.7* 28.1*  MCV 83.4 83.1  PLT 397 938*   Basic Metabolic Panel: Recent Labs  Lab 03/15/20 2312 03/17/20 0232  NA 138 137  K 4.3 4.3  CL 103 105  CO2 23 23  GLUCOSE 164* 106*  BUN 36* 24*  CREATININE 1.40* 1.18  CALCIUM 9.0 8.9   GFR: Estimated Creatinine Clearance: 56.6 mL/min (by C-G formula based on SCr of 1.18 mg/dL). Liver Function Tests: No results for input(s): AST, ALT, ALKPHOS, BILITOT, PROT, ALBUMIN in the last 168 hours. No results for input(s): LIPASE, AMYLASE in the last 168 hours. No results for input(s): AMMONIA in the last 168 hours. Coagulation Profile: No results for input(s): INR, PROTIME in the last 168 hours. Cardiac Enzymes: No results for input(s): CKTOTAL, CKMB, CKMBINDEX, TROPONINI in the last 168 hours. BNP (last 3 results) No results for input(s): PROBNP in the last 8760 hours. HbA1C: No results for input(s): HGBA1C in the last 72 hours. CBG: Recent Labs  Lab 03/17/20 0613 03/17/20 1112  03/17/20 1613 03/17/20 2132 03/18/20 0634  GLUCAP 107* 125* 113* 291* 113*   Lipid Profile: No results for input(s): CHOL, HDL, LDLCALC, TRIG, CHOLHDL, LDLDIRECT in the last 72 hours. Thyroid Function Tests: No results for input(s): TSH, T4TOTAL, FREET4, T3FREE, THYROIDAB in the last 72 hours. Anemia Panel: Recent Labs    03/17/20 0232  FERRITIN 8*  TIBC 409  IRON 27*   Sepsis Labs: No results for input(s): PROCALCITON, LATICACIDVEN in the last 168 hours.  Recent Results (from the past 240 hour(s))  SARS Coronavirus 2 by RT PCR (hospital order, performed in [facility] hospital lab) Nasopharyngeal Nasopharyngeal Swab     Status: None   Collection Time: 03/16/20  2:40 AM   Specimen: Nasopharyngeal Swab  Result Value Ref Range Status   SARS Coronavirus 2 NEGATIVE NEGATIVE Final    Comment: (NOTE) SARS-CoV-2 target nucleic acids are NOT DETECTED.  The SARS-CoV-2 RNA is generally detectable in upper and lower respiratory specimens during the acute phase of infection. The lowest concentration  of SARS-CoV-2 viral copies this assay can detect is 250 copies / mL. A negative result does not preclude SARS-CoV-2 infection and should not be used as the sole basis for treatment or other patient management decisions.  A negative result may occur with improper specimen collection / handling, submission of specimen other than nasopharyngeal swab, presence of viral mutation(s) within the areas targeted by this assay, and inadequate number of viral copies (<250 copies / mL). A negative result must be combined with clinical observations, patient history, and epidemiological information.  Fact Sheet for Patients:   StrictlyIdeas.no  Fact Sheet for Healthcare Providers: BankingDealers.co.za  This test is not yet approved or  cleared by the [place] FDA and has been authorized for detection and/or diagnosis of SARS-CoV-2 by FDA under an  Emergency Use Authorization (EUA).  This EUA will remain in effect (meaning this test can be used) for the duration of the COVID-19 declaration under Section 564(b)(1) of the Act, 21 U.S.C. section 360bbb-3(b)(1), unless the authorization is terminated or revoked sooner.  Performed at [facility], [address] [place], [place] [address]   MRSA PCR Screening     Status: None   Collection Time: 03/16/20  5:00 PM   Specimen: Nasal Mucosa; Nasopharyngeal  Result Value Ref Range Status   MRSA by PCR NEGATIVE NEGATIVE Final    Comment:        The GeneXpert MRSA Assay (FDA approved for NASAL specimens only), is one component of a comprehensive MRSA colonization surveillance program. It is not intended to diagnose MRSA infection nor to guide or monitor treatment for MRSA infections. Performed at [facility], [place] [address], [place], [place] [address]          Radiology Studies: CARDIAC CATHETERIZATION  Result Date: 03/17/2020  Severe diffuse three-vessel coronary artery disease in the 69 year old [name] gentleman with diabetes mellitus type 2.  20% mid body left main disease.  Proximal LAD stent with mid body 40 to 60% restenosis depending upon review.  Severe multifocal diffuse mid to apical LAD.  Large diagonal with ostial 50% narrowing and 60 to 70% mid narrowing.  Severe diffuse disease circumflex coronary disease with proximal to mid body circumflex up to 90% narrowed.  Each of 3 obtuse marginals have high-grade obstructive disease.  The second and third obtuse marginals are large enough to be grafted.  Dominant right coronary with moderate diffuse disease including smaller distal branches.  Eccentric 50 to 70% healed ulcerated plaque in the mid vessel above the proximal margin of the previously placed stent.  There is distal high-grade in-stent restenosis that is somewhat hazy possibly representing thrombus.  PDA contains proximal 80% stenosis.  There is  apical akinesis/dyskinesis with EF in the 35 to 40% range.  LVEDP is normal. RECOMMENDATIONS:  T CTS consultation to determine if the patient has surgical options.  The LAD is not graftable and his distribution appears to be completely infarcted based upon the ventriculogram.  Grafting of the diagonal to obtuse marginals and PDA seems possible.  If he is not a surgical candidate, there are interventional options for the right coronary which could be the patient's culprit for this presentation although symptoms are somewhat vague and include palpitations.       Scheduled Meds: . aspirin  81 mg Oral Daily  . atorvastatin  80 mg Oral Daily  . clopidogrel  75 mg Oral Daily  . losartan  50 mg Oral Daily   And  .  hydrochlorothiazide  12.5 mg Oral Daily  . insulin aspart  0-5 Units Subcutaneous QHS  . insulin aspart  0-9 Units Subcutaneous TID WC  . metoprolol succinate  25 mg Oral Daily  . pantoprazole (PROTONIX) IV  40 mg Intravenous Q24H  . sodium chloride flush  3 mL Intravenous Once  . sodium chloride flush  3 mL Intravenous Q12H   Continuous Infusions: . sodium chloride 75 mL/hr at 03/18/20 0300  . sodium chloride    . ferumoxytol    . heparin 750 Units/hr (03/18/20 0300)     LOS: 0 days    Time spent: More than 50% of that time was spent in counseling and/or coordination of care.      Shelly Coss, MD [facility] P7/05/2020, 7:37 AM

## 2020-03-18 NOTE — Progress Notes (Signed)
CARDIAC REHAB PHASE I   PRE:  Rate/Rhythm: 87 SR    BP: sitting 95/67    SaO2: 99 RA  MODE:  Ambulation: 240 ft   POST:  Rate/Rhythm: 114 ST    BP: sitting 109/59     SaO2: 99 RA  Pt asked for his shoes for ambulation. Slightly unsteady at times. Receiving blood and HR somewhat elevated, BP somewhat low therefore did not push pt long distance. He had no c/o during walk. Return to bed. Will do preop ed at later date, no family present. Department does not have IS right now.  [place], ACSM 03/18/2020 2:35 PM

## 2020-03-18 NOTE — Progress Notes (Signed)
Pre cabg has been completed.   Preliminary results in CV Proc.   Abram Sander 03/18/2020 1:19 PM

## 2020-03-18 NOTE — Progress Notes (Signed)
Progress Note  Patient Name: Jimmy Blankenship Date of Encounter: 03/18/2020  [facility] Cardiologist: Huel Centola   Subjective   69 year old gentleman with a history of coronary artery disease-status post stenting in the past.  He presented with symptoms of unstable angina. Heart catheterization yesterday reveals three-vessel coronary artery disease with moderate LV dysfunction.  He has anteroapical dyskinesis. He has been referred to CV surgery.  [place] [id number] served as our [name] interpreter ( via [employer] services)  No cp. 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  We discussed the fact that he is on iv heparin and I would not recommend going home before surgery    Inpatient Medications    Scheduled Meds: . aspirin  81 mg Oral Daily  . atorvastatin  80 mg Oral Daily  . clopidogrel  75 mg Oral Daily  . insulin aspart  0-5 Units Subcutaneous QHS  . insulin aspart  0-9 Units Subcutaneous TID WC  . losartan  50 mg Oral Daily  . metoprolol succinate  25 mg Oral Daily  . pantoprazole (PROTONIX) IV  40 mg Intravenous Q24H  . sodium chloride flush  3 mL Intravenous Once  . sodium chloride flush  3 mL Intravenous Q12H   Continuous Infusions: . sodium chloride    . ferumoxytol    . heparin 750 Units/hr (03/18/20 0300)   PRN Meds: sodium chloride, acetaminophen, nitroGLYCERIN, ondansetron (ZOFRAN) IV, oxyCODONE, sodium chloride flush   Vital Signs    Vitals:   03/17/20 2339 03/18/20 0300 03/18/20 0330 03/18/20 0734  BP: 112/70  116/75 132/64  Pulse: 95 84 (!) 103 (!) 109  Resp: (!) 25 16 16 14   Temp: 98.2 F (36.8 C)  98.2 F (36.8 C) 97.9 F (36.6 C)  TempSrc: Oral  Oral Oral  SpO2: 100% 98% 99% 97%  Weight:      Height:        Intake/Output Summary (Last 24 hours) at 03/18/2020 0813 Last data filed at 03/18/2020 0331 Gross per 24 hour  Intake 1968.36 ml  Output 1050 ml  Net 918.36 ml   Last 3 Weights  03/16/2020 03/15/2020  Weight (lbs) 145 lb 4.5 oz 150 lb  Weight (kg) 65.9 kg 68.04 kg      Telemetry    nsr  - Personally Reviewed  ECG      - Personally Reviewed  Physical Exam   GEN: No acute distress.   Neck: No JVD Cardiac: RRR, no murmurs, rubs, or gallops.  Respiratory: Clear to auscultation bilaterally. GI: Soft, nontender, non-distended  MS:  no edema.  R radial cath site looks good  Neuro:  Nonfocal  Psych: Normal affect   Labs    High Sensitivity Troponin:   Recent Labs  Lab 03/15/20 2312 03/16/20 0117 03/16/20 0356  TROPONINIHS 13 12 13       Chemistry Recent Labs  Lab 03/15/20 2312 03/17/20 0232  NA 138 137  K 4.3 4.3  CL 103 105  CO2 23 23  GLUCOSE 164* 106*  BUN 36* 24*  CREATININE 1.40* 1.18  CALCIUM 9.0 8.9  GFRNONAA 52* >60  GFRAA 60* >60  ANIONGAP 12 9     Hematology Recent Labs  Lab 03/15/20 2312 03/17/20 0232  WBC 7.8 8.0  RBC 3.32* 3.38*  HGB 8.7* 8.5*  HCT 27.7* 28.1*  MCV 83.4 83.1  MCH 26.2 25.1*  MCHC 31.4 30.2  RDW 14.0 13.8  PLT  397 403*    BNPNo results for input(s): BNP, PROBNP in the last 168 hours.   DDimer No results for input(s): DDIMER in the last 168 hours.   Radiology    CARDIAC CATHETERIZATION  Result Date: 03/17/2020  Severe diffuse three-vessel coronary artery disease in the 69 year old [name] gentleman with diabetes mellitus type 2.  20% mid body left main disease.  Proximal LAD stent with mid body 40 to 60% restenosis depending upon review.  Severe multifocal diffuse mid to apical LAD.  Large diagonal with ostial 50% narrowing and 60 to 70% mid narrowing.  Severe diffuse disease circumflex coronary disease with proximal to mid body circumflex up to 90% narrowed.  Each of 3 obtuse marginals have high-grade obstructive disease.  The second and third obtuse marginals are large enough to be grafted.  Dominant right coronary with moderate diffuse disease including smaller distal branches.  Eccentric 50 to  70% healed ulcerated plaque in the mid vessel above the proximal margin of the previously placed stent.  There is distal high-grade in-stent restenosis that is somewhat hazy possibly representing thrombus.  PDA contains proximal 80% stenosis.  There is apical akinesis/dyskinesis with EF in the 35 to 40% range.  LVEDP is normal. RECOMMENDATIONS:  T CTS consultation to determine if the patient has surgical options.  The LAD is not graftable and his distribution appears to be completely infarcted based upon the ventriculogram.  Grafting of the diagonal to obtuse marginals and PDA seems possible.  If he is not a surgical candidate, there are interventional options for the right coronary which could be the patient's culprit for this presentation although symptoms are somewhat vague and include palpitations.   Cardiac Studies      Patient Profile    2 69 y.o. male with known CAD, has had progression of his cad and now has been referred to surgery   Assessment & Plan    1.  Coronary artery disease.  The patient has severe three-vessel coronary artery disease.  He has anteroapical akinesis.  I agree with Dr. Tamala Julian that his best option is for bypass surgery.  I have discussed this surgery at some length through the translator.  He understands the process.  He understands that he will be in the hospital for 5 to 7 days and that he will need several months of healing in order to recover.  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  He was pleased that he would be able to get back at that time.  I have stressed the fact that he would not be able to do any lifting and he understands.  Will be seen by the surgeons, hopefully today and hopefully will be able to arrange surgery next week. Plavix has been held , cont IV heparin    2.  Hyperlipidemia: Labs  from the [place] lab from March 04, 2020 reveal total cholesterol of 126.  The triglycerides are 71.  The HDL is 38.  LDL 73.  Continue atorvastatin 40 mg a day.  3.  Hypertension: Continue current medications.  4.  Hyperlipidemia: Continue current medications.  45 min face to face with interpreter explaining surgery , disease process, his coronary disease, options, prognosis, expected time in hospital and away from work, etc.     Total 1 hour of patient  care time.      For questions or updates, please contact [place] Please consult [email or web address] for contact info under   Signed, Mertie Moores, MD  03/18/2020, 8:13 AM

## 2020-03-18 NOTE — Consult Note (Signed)
[address]       [place] [address]             [phone]        [employer] Record #[id number] Date of Birth: 09-Oct-1950   Referring: Daneen Schick, MD primary Care: [name], MD Primary Cardiologist: Liam Rogers MD  Chief Complaint:    Chief Complaint  Patient presents with  . Chest Pain   Patient examined, images of cardiac catheterization, echocardiogram, chest x-ray personally reviewed.  The patient's daughter served as interpreter for the consultation and discussion of surgery.  History of Present Illness:     69 year old [name] diabetic with history of previous PCI over 10 years ago in [place] with symptoms of unstable angina worsening over the past 48 hours prior to admission.  Patient relates associated shortness of breath but no nausea.  Pain was relieved by nitroglycerin  The patient's EKG in the ED showed sinus rhythm with occasional PVCs.  Nonspecific ST segment changes..  Cardiac enzymes are minimally elevated.  In late November the patient's echo showed EF 50-55%. Catheterization performed July 8 demonstrates severe three-vessel coronary disease with a diabetic pattern.  The posterior descending, diagonal, and circumflex are potential targets for grafting.  The LAD is atretic and chronically occluded.  LVEDP was normal.  Echo cardiogram performed yesterday shows EF 35% with apical hypokinesia and mild MR.  No apical thrombus with contrast.  The patient presented with anemia hemoglobin 7.7.  His stool is guaiac positive.  He was seen by GI who recommended continuing Protonix and following clinically.  Last colonoscopy was over 10 years ago.  No history of hematemesis or bright red blood per rectum Blood sugar was over 200 on admission, hemoglobin A1c pending  Current Activity/ Functional Status: Patient lives with family in a sedentary lifestyle   Zubrod Score: At the time of surgery this patient's most  appropriate activity status/level should be described as: []     0    Normal activity, no symptoms []     1    Restricted in physical strenuous activity but ambulatory, able to do out light work [x]     2    Ambulatory and capable of self care, unable to do work activities, up and about                 more than 50%  Of the time                            []     3    Only limited self care, in bed greater than 50% of waking hours []     4    Completely disabled, no self care, confined to bed or chair []     5    Moribund  Past Medical History:  Diagnosis Date  . Diabetes mellitus without complication ([place])   . High cholesterol   . Hypertension     Past Surgical History:  Procedure Laterality Date  . CORONARY ANGIOPLASTY WITH STENT PLACEMENT    . LEFT HEART CATH AND CORONARY ANGIOGRAPHY N/A 03/17/2020   Procedure: LEFT HEART CATH AND CORONARY ANGIOGRAPHY;  Surgeon: Belva Crome, MD;  Location: [facility];  Service: Cardiovascular;  Laterality: N/A;    Social History   Tobacco Use  Smoking Status Former Smoker  Smokeless Tobacco Never Used    Social History  Substance and Sexual Activity  Alcohol Use Never     No Known Allergies  Current Facility-Administered Medications  Medication Dose Route Frequency Provider Last Rate Last Admin  . 0.9 %  sodium chloride infusion (Manually program via Guardrails IV Fluids)   Intravenous Once Shelly Coss, MD      . 0.9 %  sodium chloride infusion  250 mL Intravenous PRN Belva Crome, MD      . acetaminophen (TYLENOL) tablet 650 mg  650 mg Oral Q4H PRN Belva Crome, MD      . aspirin chewable tablet 81 mg  81 mg Oral Daily Belva Crome, MD   81 mg at 03/18/20 0859  . atorvastatin (LIPITOR) tablet 80 mg  80 mg Oral Daily Belva Crome, MD   80 mg at 03/18/20 0900  . heparin ADULT infusion 100 units/mL (25000 units/23mL sodium chloride 0.45%)  800 Units/hr Intravenous Continuous Einar Grad, RPH 8 mL/hr at 03/18/20 1133  800 Units/hr at 03/18/20 1133  . insulin aspart (novoLOG) injection 0-5 Units  0-5 Units Subcutaneous QHS Belva Crome, MD   3 Units at 03/17/20 2135  . insulin aspart (novoLOG) injection 0-9 Units  0-9 Units Subcutaneous TID WC Belva Crome, MD   2 Units at 03/18/20 1134  . losartan (COZAAR) tablet 50 mg  50 mg Oral Daily Belva Crome, MD   50 mg at 03/18/20 0858  . metoprolol succinate (TOPROL-XL) 24 hr tablet 25 mg  25 mg Oral Daily Belva Crome, MD   25 mg at 03/18/20 0900  . nitroGLYCERIN (NITROSTAT) SL tablet 0.4 mg  0.4 mg Sublingual Q5 min PRN Belva Crome, MD      . ondansetron [facility]) injection 4 mg  4 mg Intravenous Q6H PRN Belva Crome, MD      . oxyCODONE (Oxy IR/ROXICODONE) immediate release tablet 5-10 mg  5-10 mg Oral Q4H PRN Belva Crome, MD      . pantoprazole (PROTONIX) injection 40 mg  40 mg Intravenous Q24H Belva Crome, MD   40 mg at 03/17/20 1704  . sodium chloride flush (NS) 0.9 % injection 3 mL  3 mL Intravenous Once Belva Crome, MD      . sodium chloride flush (NS) 0.9 % injection 3 mL  3 mL Intravenous Q12H Belva Crome, MD   3 mL at 03/18/20 0900  . sodium chloride flush (NS) 0.9 % injection 3 mL  3 mL Intravenous PRN Belva Crome, MD        Medications Prior to Admission  Medication Sig Dispense Refill Last Dose  . atorvastatin (LIPITOR) 40 MG tablet Take 40 mg by mouth daily.   03/15/2020 at Unknown time  . clopidogrel (PLAVIX) 75 MG tablet Take 75 mg by mouth daily.   03/15/2020 at Unknown time  . glipiZIDE (GLUCOTROL) 5 MG tablet Take 5 mg by mouth 2 (two) times daily.   03/15/2020 at Unknown time  . losartan-hydrochlorothiazide (HYZAAR) 50-12.5 MG tablet Take 1 tablet by mouth daily.   03/15/2020 at Unknown time  . metFORMIN (GLUCOPHAGE) 1000 MG tablet Take 1,000 mg by mouth 2 (two) times daily.   03/15/2020 at Unknown time  . metoprolol succinate (TOPROL-XL) 25 MG 24 hr tablet Take 25 mg by mouth daily.   03/15/2020 at 2100    No family history on  file.   Review of Systems:   ROS Right-hand-dominant No history of thoracic trauma pneumothorax or rib  fracture    Cardiac Review of Systems: Y or  [    ]= no  Chest Pain [  y  ]  Resting SOB [   ] Exertional SOB  Blue.Reese  ]  Orthopnea [  ]   Pedal Edema [   ]    Palpitations [  ] Syncope  [  ]   Presyncope [   ]  General Review of Systems: [Y] = yes [  ]=no Constitional: recent weight change [  ]; anorexia [  ]; fatigue [  ]; nausea [  ]; night sweats [  ]; fever [  ]; or chills [  ]                                                               Dental: Last Dentist visit:   Eye : blurred vision [  ]; diplopia [   ]; vision changes [  ];  Amaurosis fugax[  ]; Resp: cough [  ];  wheezing[  ];  hemoptysis[  ]; shortness of breath[  ]; paroxysmal nocturnal dyspnea[  ]; dyspnea on exertion[ y ]; or orthopnea[  ];  GI:  gallstones[  ], vomiting[  ];  dysphagia[  ]; melena[  ];  hematochezia [  ]; heartburn[  ];   Hx of  Colonoscopy[  ]; GU: kidney stones [  ]; hematuria[  ];   dysuria [  ];  nocturia[  ];  history of     obstruction [  ]; urinary frequency [  ]             Skin: rash, swelling[  ];, hair loss[  ];  peripheral edema[  ];  or itching[  ]; Musculosketetal: myalgias[  ];  joint swelling[  ];  joint erythema[  ];  joint pain[  ];  back pain[  ];  Heme/Lymph: bruising[  ];  bleeding[  ];  anemia[y  ];  Neuro: TIA[  ];  headaches[  ];  stroke[  ];  vertigo[  ];  seizures[  ];   paresthesias[  ];  difficulty walking[  ];  Psych:depression[  ]; anxiety[  ];  Endocrine: diabetes[ y ];  thyroid dysfunction[  ];       Physical Exam: BP 95/64   Pulse 88   Temp 98.6 F (37 C) (Oral)   Resp 20   Ht 5\' 8"  (1.727 m)   Wt 65.9 kg   SpO2 98%   BMI 22.09 kg/m        Physical Exam  General: Small Asian man in no acute distress.  His daughter is on the phone listening to the entire interaction and answering and asking questions. HEENT: Normocephalic pupils equal , dentition  adequate Neck: Supple without JVD, adenopathy, or bruit Chest: Clear to auscultation, symmetrical breath sounds, no rhonchi, no tenderness             or deformity Cardiovascular: Regular rate and rhythm, no murmur, no gallop, peripheral pulses             palpable in all extremities Abdomen:  Soft, nontender, no palpable mass or organomegaly Extremities: Warm, well-perfused, no clubbing cyanosis edema or tenderness,              no  venous stasis changes of the legs Rectal/GU: Deferred Neuro: Grossly non--focal and symmetrical throughout Skin: Clean and dry without rash or ulceration.  Sebaceous cyst over lower sternum midline   Diagnostic Studies & Laboratory data:     Recent Radiology Findings:   CARDIAC CATHETERIZATION  Result Date: 03/17/2020  Severe diffuse three-vessel coronary artery disease in the 69 year old [name] gentleman with diabetes mellitus type 2.  20% mid body left main disease.  Proximal LAD stent with mid body 40 to 60% restenosis depending upon review.  Severe multifocal diffuse mid to apical LAD.  Large diagonal with ostial 50% narrowing and 60 to 70% mid narrowing.  Severe diffuse disease circumflex coronary disease with proximal to mid body circumflex up to 90% narrowed.  Each of 3 obtuse marginals have high-grade obstructive disease.  The second and third obtuse marginals are large enough to be grafted.  Dominant right coronary with moderate diffuse disease including smaller distal branches.  Eccentric 50 to 70% healed ulcerated plaque in the mid vessel above the proximal margin of the previously placed stent.  There is distal high-grade in-stent restenosis that is somewhat hazy possibly representing thrombus.  PDA contains proximal 80% stenosis.  There is apical akinesis/dyskinesis with EF in the 35 to 40% range.  LVEDP is normal. RECOMMENDATIONS:  T CTS consultation to determine if the patient has surgical options.  The LAD is not graftable and his distribution  appears to be completely infarcted based upon the ventriculogram.  Grafting of the diagonal to obtuse marginals and PDA seems possible.  If he is not a surgical candidate, there are interventional options for the right coronary which could be the patient's culprit for this presentation although symptoms are somewhat vague and include palpitations.  ECHOCARDIOGRAM COMPLETE  Result Date: 03/18/2020    ECHOCARDIOGRAM REPORT   Patient Name:   Jimmy Blankenship Date of Exam: 03/18/2020 Medical Rec #:  [id number]     Height:       68.0 in Accession #:    [id number]    Weight:       145.3 lb Date of Birth:  Feb 23, 1951     BSA:          1.784 m Patient Age:    38 years      BP:           132/64 mmHg Patient Gender: M             HR:           95 bpm. Exam Location:  Inpatient Procedure: 2D Echo, Cardiac Doppler, Color Doppler and Intracardiac            Opacification Agent Indications:    pre-op evaluation  History:        Patient has no prior history of Echocardiogram examinations.                 CAD, Signs/Symptoms:Chest Pain; Risk Factors:Hypertension,                 Diabetes, Dyslipidemia and Former Smoker.  Sonographer:    Vickie Epley RDCS Referring Phys: [place]  1. LVEF is depressed with hypokinesis/akinesis of the distal inferior, mid/distal septal, distal lataral and apical walls.. Left ventricular ejection fraction, by estimation, is 35%. The left ventricle has moderately decreased function. The left ventricular internal cavity size was mildly to moderately dilated.  2. Right ventricular systolic function is normal. The right ventricular size is normal.  3. The mitral  valve is normal in structure. Trivial mitral valve regurgitation.  4. The aortic valve is normal in structure. Aortic valve regurgitation is not visualized.  5. The inferior vena cava is normal in size with greater than 50% respiratory variability, suggesting right atrial pressure of 3 mmHg. FINDINGS  Left Ventricle: LVEF is  depressed with hypokinesis/akinesis of the distal inferior, mid/distal septal, distal lataral and apical walls. Left ventricular ejection fraction, by estimation, is 35%%. The left ventricle has moderately decreased function. The  left ventricle demonstrates regional wall motion abnormalities. Definity contrast agent was given IV to delineate the left ventricular endocardial borders. The left ventricular internal cavity size was mildly to moderately dilated. There is no left ventricular hypertrophy. Left ventricular diastolic parameters are consistent with Grade I diastolic dysfunction (impaired relaxation). Right Ventricle: The right ventricular size is normal. No increase in right ventricular wall thickness. Right ventricular systolic function is normal. Left Atrium: Left atrial size was normal in size. Right Atrium: Right atrial size was normal in size. Pericardium: There is no evidence of pericardial effusion. Mitral Valve: The mitral valve is normal in structure. Trivial mitral valve regurgitation. Tricuspid Valve: The tricuspid valve is normal in structure. Tricuspid valve regurgitation is trivial. Aortic Valve: The aortic valve is normal in structure. Aortic valve regurgitation is not visualized. Pulmonic Valve: The pulmonic valve was not well visualized. Pulmonic valve regurgitation is not visualized. Aorta: The aortic root is normal in size and structure. Venous: The inferior vena cava is normal in size with greater than 50% respiratory variability, suggesting right atrial pressure of 3 mmHg. IAS/Shunts: No atrial level shunt detected by color flow Doppler.  LEFT VENTRICLE PLAX 2D LVIDd:         5.50 cm LVIDs:         4.20 cm LV PW:         0.90 cm LV IVS:        0.90 cm LVOT diam:     2.10 cm LV SV:         59 LV SV Index:   33 LVOT Area:     3.46 cm  LV Volumes (MOD) LV vol d, MOD A4C: 126.0 ml LV vol s, MOD A4C: 93.6 ml LV SV MOD A4C:     126.0 ml RIGHT VENTRICLE TAPSE (M-mode): 1.9 cm LEFT ATRIUM              Index       RIGHT ATRIUM          Index LA diam:        3.30 cm 1.85 cm/m  RA Area:     9.31 cm LA Vol (A2C):   31.9 ml 17.88 ml/m RA Volume:   17.20 ml 9.64 ml/m LA Vol (A4C):   26.8 ml 15.02 ml/m LA Biplane Vol: 30.1 ml 16.87 ml/m  AORTIC VALVE LVOT Vmax:   102.00 cm/s LVOT Vmean:  65.400 cm/s LVOT VTI:    0.171 m  AORTA Ao Root diam: 3.30 cm  SHUNTS Systemic VTI:  0.17 m Systemic Diam: 2.10 cm Dorris Carnes MD Electronically signed by Dorris Carnes MD Signature Date/Time: 03/18/2020/12:08:03 PM    Final    VAS US DOPPLER PRE CABG  Result Date: 03/18/2020 PREOPERATIVE VASCULAR EVALUATION  Indications:      Pre-CABG. Risk Factors:     Hypertension, hyperlipidemia, Diabetes. Comparison Study: no prior Performing Technologist: Abram Sander RVS  Examination Guidelines: A complete evaluation includes B-mode imaging, spectral Doppler, color Doppler, and power Doppler  as needed of all accessible portions of each vessel. Bilateral testing is considered an integral part of a complete examination. Limited examinations for reoccurring indications may be performed as noted.  Right Carotid Findings: +----------+--------+--------+--------+------------+--------+           PSV cm/sEDV cm/sStenosisDescribe    Comments +----------+--------+--------+--------+------------+--------+ CCA Prox  96      14              heterogenous         +----------+--------+--------+--------+------------+--------+ CCA Distal52      9               heterogenous         +----------+--------+--------+--------+------------+--------+ ICA Prox  62      17      1-39%   heterogenous         +----------+--------+--------+--------+------------+--------+ ICA Distal66      23                                   +----------+--------+--------+--------+------------+--------+ ECA       109     7                                    +----------+--------+--------+--------+------------+--------+ Portions of this table do not  appear on this page. +----------+--------+-------+--------+------------+           PSV cm/sEDV cmsDescribeArm Pressure +----------+--------+-------+--------+------------+ Subclavian93                                  +----------+--------+-------+--------+------------+ +---------+--------+--+--------+--+---------+ VertebralPSV cm/s50EDV cm/s15Antegrade +---------+--------+--+--------+--+---------+ Left Carotid Findings: +----------+--------+--------+--------+------------+--------+           PSV cm/sEDV cm/sStenosisDescribe    Comments +----------+--------+--------+--------+------------+--------+ CCA Prox  98      10              heterogenous         +----------+--------+--------+--------+------------+--------+ CCA Distal49      9               heterogenous         +----------+--------+--------+--------+------------+--------+ ICA Prox  69      27      1-39%   heterogenous         +----------+--------+--------+--------+------------+--------+ ICA Distal62      30                                   +----------+--------+--------+--------+------------+--------+ ECA       93      9                                    +----------+--------+--------+--------+------------+--------+ +----------+--------+--------+--------+------------+ SubclavianPSV cm/sEDV cm/sDescribeArm Pressure +----------+--------+--------+--------+------------+           102                                  +----------+--------+--------+--------+------------+ +---------+--------+--+--------+--+---------+ VertebralPSV cm/s44EDV cm/s15Antegrade +---------+--------+--+--------+--+---------+  ABI Findings: +--------+------------------+-----+---------+--------+ Right   Rt Pressure (mmHg)IndexWaveform Comment  +--------+------------------+-----+---------+--------+ [id number]  triphasic         +--------+------------------+-----+---------+--------+ PTA      163               1.38 triphasic         +--------+------------------+-----+---------+--------+ DP      131               1.11 triphasic         +--------+------------------+-----+---------+--------+ +--------+------------------+-----+---------+-------+ Left    Lt Pressure (mmHg)IndexWaveform Comment +--------+------------------+-----+---------+-------+ [id number]                    triphasic        +--------+------------------+-----+---------+-------+ PTA     148               1.25 triphasic        +--------+------------------+-----+---------+-------+ DP      145               1.23 triphasic        +--------+------------------+-----+---------+-------+ +-------+---------------+----------------+ ABI/TBIToday's ABI/TBIPrevious ABI/TBI +-------+---------------+----------------+ Right  1.38                            +-------+---------------+----------------+ Left   1.25                            +-------+---------------+----------------+  Right Doppler Findings: +--------+--------+-----+---------+--------+ Site    PressureIndexDoppler  Comments +--------+--------+-----+---------+--------+ [id number]          triphasic         +--------+--------+-----+---------+--------+ Radial               triphasic         +--------+--------+-----+---------+--------+ Ulnar                triphasic         +--------+--------+-----+---------+--------+  Left Doppler Findings: +--------+--------+-----+---------+--------+ Site    PressureIndexDoppler  Comments +--------+--------+-----+---------+--------+ [id number]          triphasic         +--------+--------+-----+---------+--------+ Radial               triphasic         +--------+--------+-----+---------+--------+ Ulnar                triphasic         +--------+--------+-----+---------+--------+  Summary: Right Carotid: Velocities in the right ICA are consistent with a 1-39% stenosis.  Left Carotid: Velocities in the left ICA are consistent with a 1-39% stenosis. Vertebrals: Bilateral vertebral arteries demonstrate antegrade flow. Right ABI: Resting right ankle-brachial index indicates noncompressible right lower extremity arteries. Left ABI: Resting left ankle-brachial index is within normal range. No evidence of significant left lower extremity arterial disease. Right Upper Extremity: Doppler waveform obliterate with right radial compression. Doppler waveforms remain within normal limits with right ulnar compression. Left Upper Extremity: Doppler waveforms remain within normal limits with left radial compression. Doppler waveforms remain within normal limits with left ulnar compression.     Preliminary      I have independently reviewed the above radiologic studies and discussed with the patient   Recent Lab Findings: Lab Results  Component Value Date   WBC 5.3 03/18/2020   HGB 7.7 (L) 03/18/2020   HCT 24.6 (L) 03/18/2020   PLT 351 03/18/2020   GLUCOSE 222 (H) 03/18/2020   NA 137 03/18/2020   K 3.9 03/18/2020   CL 109 03/18/2020   CREATININE  1.17 03/18/2020   BUN 16 03/18/2020   CO2 22 03/18/2020      Assessment / Plan:   Severe three-vessel coronary disease with unstable angina Moderate LV dysfunction, ischemic cardiomyopathy Diabetes mellitus Anemia probably due to chronic blood loss, chronic Plavix dosing Sinus rhythm with PACs    Patient will be pared for multivessel coronary bypass grafting with left IMA plan to the LAD and vein grafts to the PDA and OM.  Surgery will take place next week after Plavix washout, probably July 15.  The patient through his daughter's interpretation understands the benefits and risks of surgery and expected postoperative recovery and length of hospitalization.  @ME1 @ 03/18/2020 2:02 PM

## 2020-03-18 NOTE — Plan of Care (Signed)

## 2020-03-18 NOTE — Progress Notes (Signed)
ANTICOAGULATION CONSULT NOTE  Pharmacy Consult for heparin Indication: Post CAD Pending Surgery eval  No Known Allergies  Patient Measurements: Height: 5\' 8"  (172.7 cm) Weight: 65.9 kg (145 lb 4.5 oz) IBW/kg (Calculated) : 68.4 Vital Signs: Temp: 97.9 F (36.6 C) (07/09 1107) Temp Source: Oral (07/09 1107) BP: 123/74 (07/09 1107) Pulse Rate: 94 (07/09 1107)  Labs: Recent Labs    03/15/20 2312 03/15/20 2312 03/16/20 0117 03/16/20 0356 03/17/20 0232 03/18/20 1017  HGB 8.7*   < >  --   --  8.5* 7.7*  HCT 27.7*  --   --   --  28.1* 24.6*  PLT 397  --   --   --  403* 351  HEPARINUNFRC  --   --   --   --   --  0.30  CREATININE 1.40*  --   --   --  1.18 1.17  TROPONINIHS 13  --  12 13  --   --    < > = values in this interval not displayed.    Estimated Creatinine Clearance: 57.1 mL/min (by C-G formula based on SCr of 1.17 mg/dL).  Assessment: 61 yoM s/p cath with 3vCAD. Surgery consult pending, IV heparin resumed.  Heparin level therapeutic at 0.3, Hgb down slightly this am.  Goal of Therapy:  Heparin level 0.3-0.7 units/ml Monitor platelets by anticoagulation protocol: Yes   Plan:  -Increase heparin slightly to 800 units/h -Recheck heparin level with am labs   Arrie Senate, PharmD, BCPS Clinical Pharmacist [phone] Please check AMION for all [place] numbers 03/18/2020

## 2020-03-19 ENCOUNTER — Inpatient Hospital Stay (HOSPITAL_COMMUNITY): Payer: Medicare Other

## 2020-03-19 DIAGNOSIS — N179 Acute kidney failure, unspecified: Secondary | ICD-10-CM

## 2020-03-19 DIAGNOSIS — I25110 Atherosclerotic heart disease of native coronary artery with unstable angina pectoris: Principal | ICD-10-CM

## 2020-03-19 DIAGNOSIS — I255 Ischemic cardiomyopathy: Secondary | ICD-10-CM

## 2020-03-19 DIAGNOSIS — E782 Mixed hyperlipidemia: Secondary | ICD-10-CM

## 2020-03-19 DIAGNOSIS — I10 Essential (primary) hypertension: Secondary | ICD-10-CM

## 2020-03-19 LAB — GLUCOSE, CAPILLARY
Glucose-Capillary: 139 mg/dL — ABNORMAL HIGH (ref 70–99)
Glucose-Capillary: 186 mg/dL — ABNORMAL HIGH (ref 70–99)
Glucose-Capillary: 250 mg/dL — ABNORMAL HIGH (ref 70–99)
Glucose-Capillary: 265 mg/dL — ABNORMAL HIGH (ref 70–99)

## 2020-03-19 LAB — BLOOD GAS, ARTERIAL
Acid-Base Excess: 0.2 mmol/L (ref 0.0–2.0)
Bicarbonate: 24 mmol/L (ref 20.0–28.0)
Drawn by: 414221
FIO2: 21
O2 Saturation: 94.9 %
Patient temperature: 37
pCO2 arterial: 37 mmHg (ref 32.0–48.0)
pH, Arterial: 7.428 (ref 7.350–7.450)
pO2, Arterial: 77 mmHg — ABNORMAL LOW (ref 83.0–108.0)

## 2020-03-19 LAB — HEPARIN LEVEL (UNFRACTIONATED)
Heparin Unfractionated: 0.92 IU/mL — ABNORMAL HIGH (ref 0.30–0.70)
Heparin Unfractionated: 0.68 IU/mL (ref 0.30–0.70)
Heparin Unfractionated: 0.46 IU/mL (ref 0.30–0.70)

## 2020-03-19 LAB — BPAM RBC
Blood Product Expiration Date: 202108062359
ISSUE DATE / TIME: 202107091329
Unit Type and Rh: 6200

## 2020-03-19 LAB — COMPREHENSIVE METABOLIC PANEL
ALT: 20 U/L (ref 0–44)
AST: 20 U/L (ref 15–41)
Albumin: 3.1 g/dL — ABNORMAL LOW (ref 3.5–5.0)
Alkaline Phosphatase: 61 U/L (ref 38–126)
Anion gap: 10 (ref 5–15)
BUN: 19 mg/dL (ref 8–23)
CO2: 21 mmol/L — ABNORMAL LOW (ref 22–32)
Calcium: 8.6 mg/dL — ABNORMAL LOW (ref 8.9–10.3)
Chloride: 106 mmol/L (ref 98–111)
Creatinine, Ser: 1.21 mg/dL (ref 0.61–1.24)
GFR calc Af Amer: >60 mL/min (ref >60)
GFR calc non Af Amer: >60 mL/min (ref >60)
Glucose, Bld: 166 mg/dL — ABNORMAL HIGH (ref 70–99)
Potassium: 4 mmol/L (ref 3.5–5.1)
Sodium: 137 mmol/L (ref 135–145)
Total Bilirubin: 1.1 mg/dL (ref 0.3–1.2)
Total Protein: 6 g/dL — ABNORMAL LOW (ref 6.5–8.1)

## 2020-03-19 LAB — CBC
HCT: 31.5 % — ABNORMAL LOW (ref 39.0–52.0)
Hemoglobin: 9.9 g/dL — ABNORMAL LOW (ref 13.0–17.0)
MCH: 26.5 pg (ref 26.0–34.0)
MCHC: 31.4 g/dL (ref 30.0–36.0)
MCV: 84.2 fL (ref 80.0–100.0)
Platelets: 378 10*3/uL (ref 150–400)
RBC: 3.74 MIL/uL — ABNORMAL LOW (ref 4.22–5.81)
RDW: 13.9 % (ref 11.5–15.5)
WBC: 6.7 10*3/uL (ref 4.0–10.5)
nRBC: 0 % (ref 0.0–0.2)

## 2020-03-19 LAB — TYPE AND SCREEN
ABO/RH(D): A POS
Antibody Screen: NEGATIVE
Unit division: 0

## 2020-03-19 LAB — PROTIME-INR
INR: 1.1 (ref 0.8–1.2)
Prothrombin Time: 13.8 seconds (ref 11.4–15.2)

## 2020-03-19 LAB — TSH: TSH: 1.167 u[IU]/mL (ref 0.350–4.500)

## 2020-03-19 LAB — PLATELET INHIBITION P2Y12: Platelet Function  P2Y12: 237 [PRU] (ref 182–335)

## 2020-03-19 IMAGING — CT CT ABD-PELV W/O CM
2 of 4 series · 13 of 36 positions shown, 16 images · non-contrast
Comparison: None.

CLINICAL DATA: Anemia due to chronic blood loss. Ischemic
cardiomyopathy.

EXAM:
CT CHEST, ABDOMEN AND PELVIS WITHOUT CONTRAST
TECHNIQUE: Multidetector CT imaging of the chest, abdomen and pelvis was
performed following the standard protocol without IV contrast.

[Series 3: cap wo 5.0 i31f 2 · axial · 0.83mm/px · z∈[+808,+1403]mm · 10 of 143 slices shown, 13 images]
[im 12/143  mediastinal]
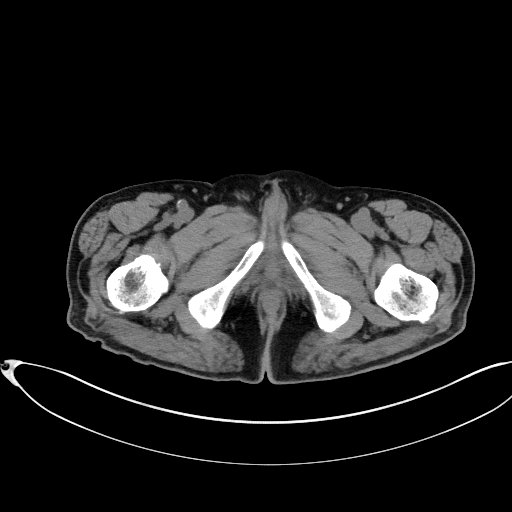
[im 12/143  lung]
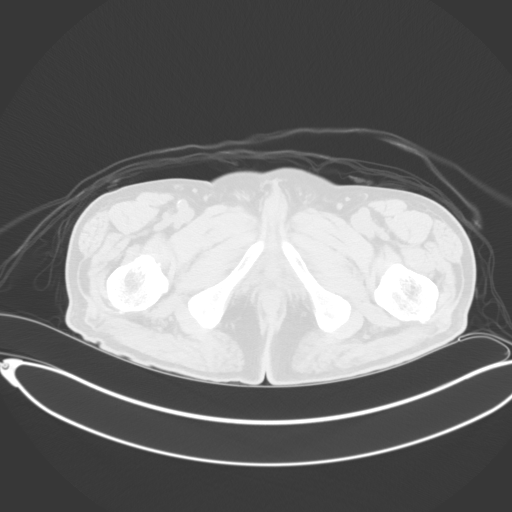
[im 24/143  lung]
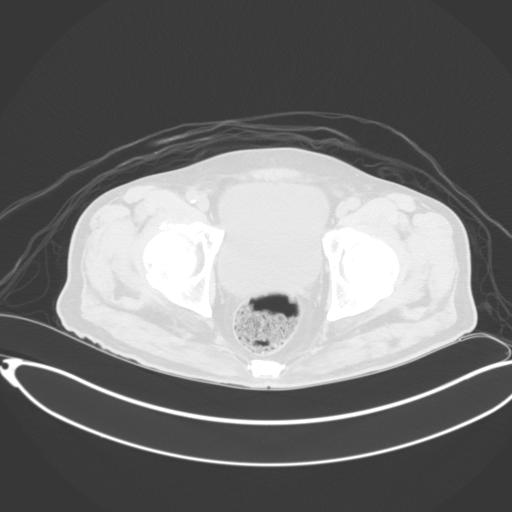
[im 36/143  lung]
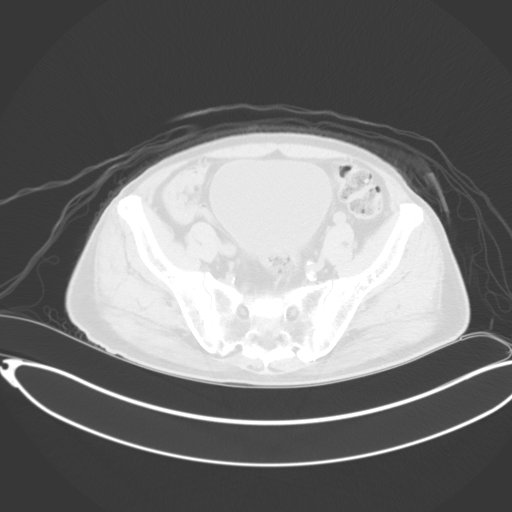
[im 48/143  lung]
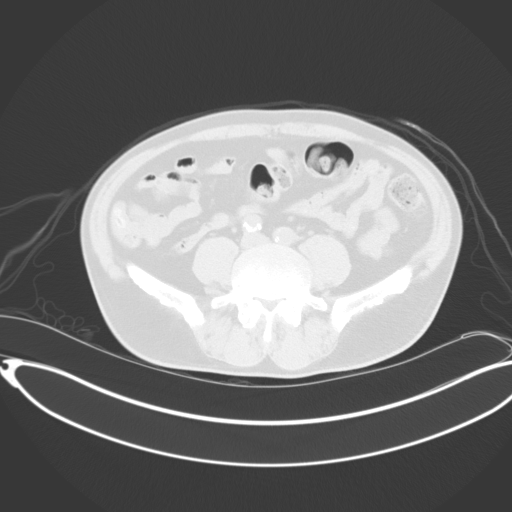
[im 60/143  mediastinal]
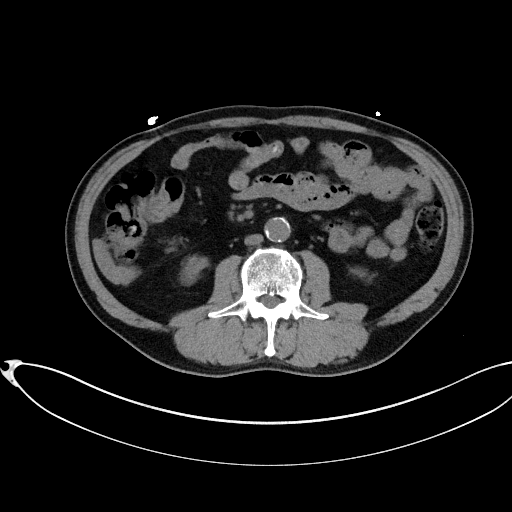
[im 60/143  lung]
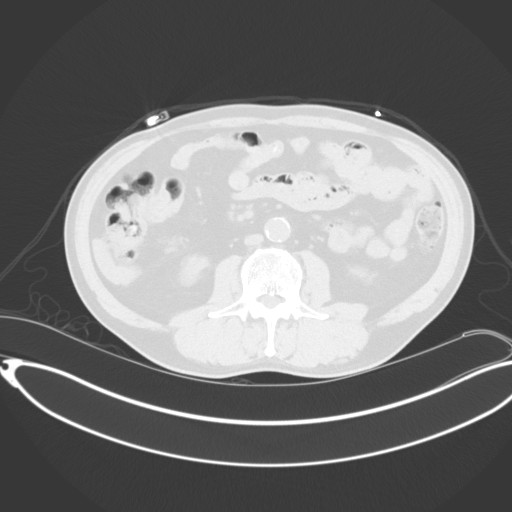
[im 83/143  lung]
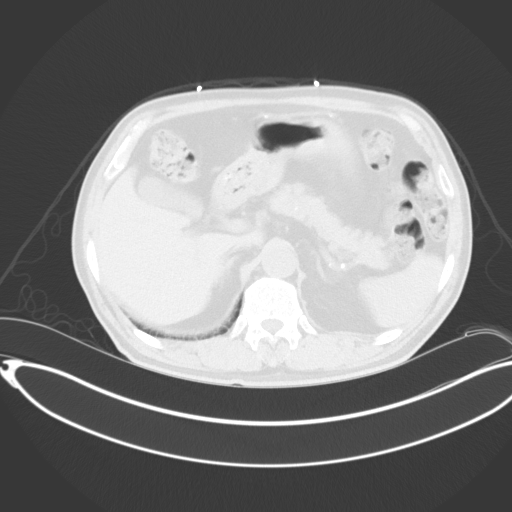
[im 95/143  lung]
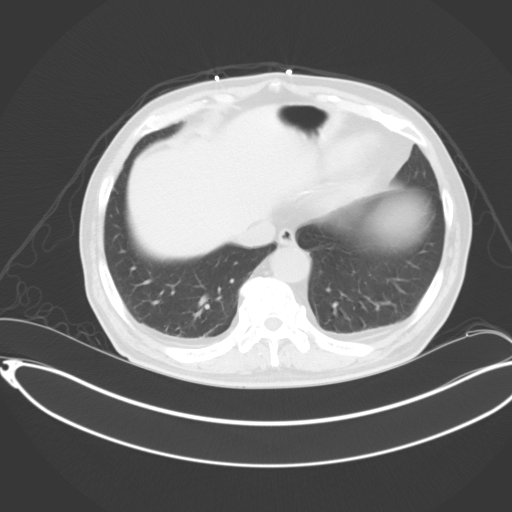
[im 107/143  lung]
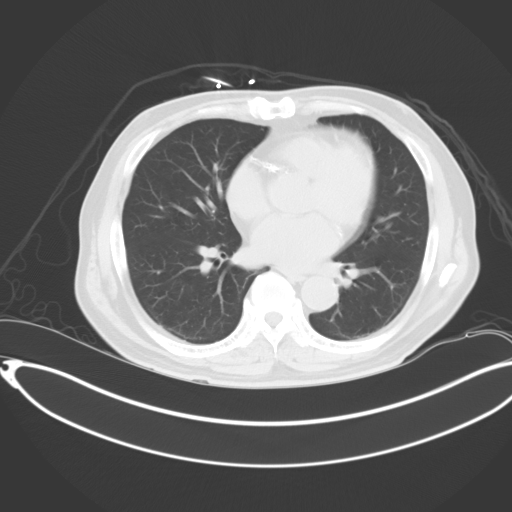
[im 119/143  mediastinal]
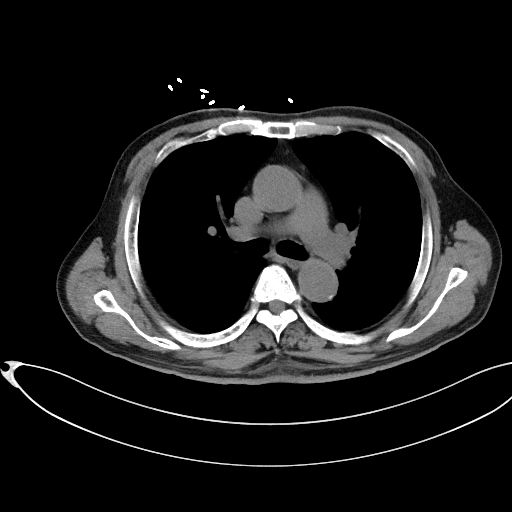
[im 119/143  lung]
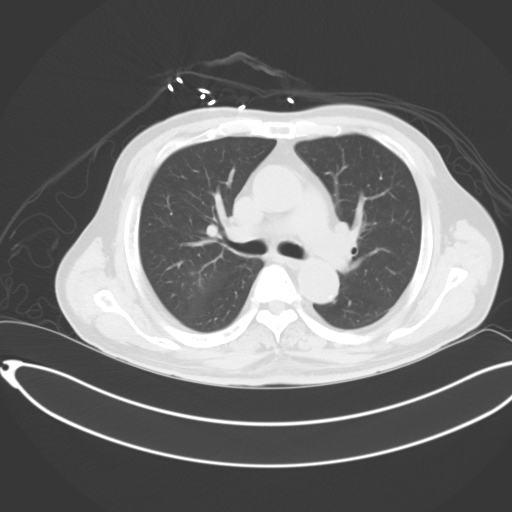
[im 131/143  lung]
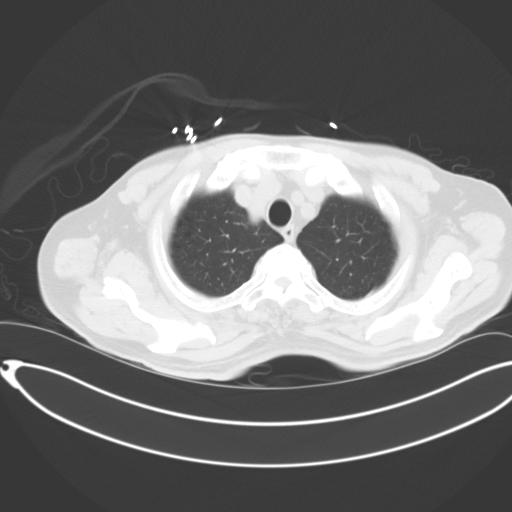

[Series 6: coronal · coronal · 0.87mm/px · 3 of 153 slices shown]
[im 31/153  lung]
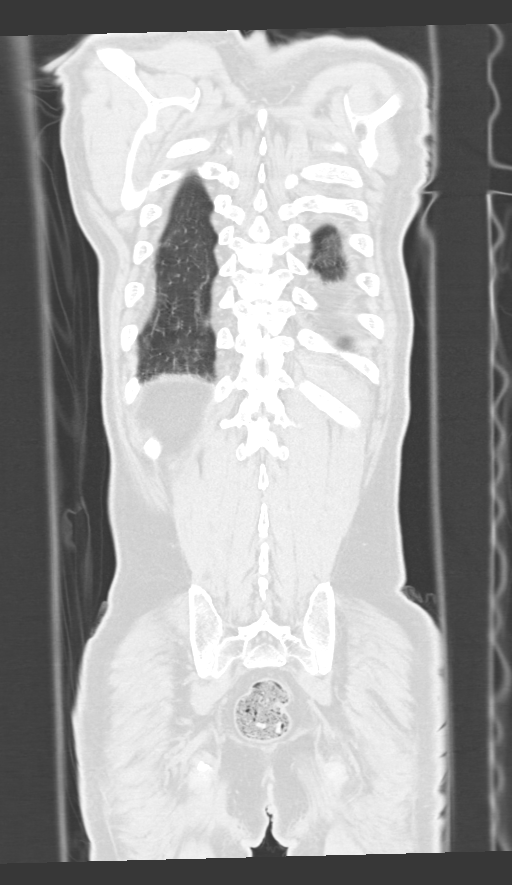
[im 61/153  lung]
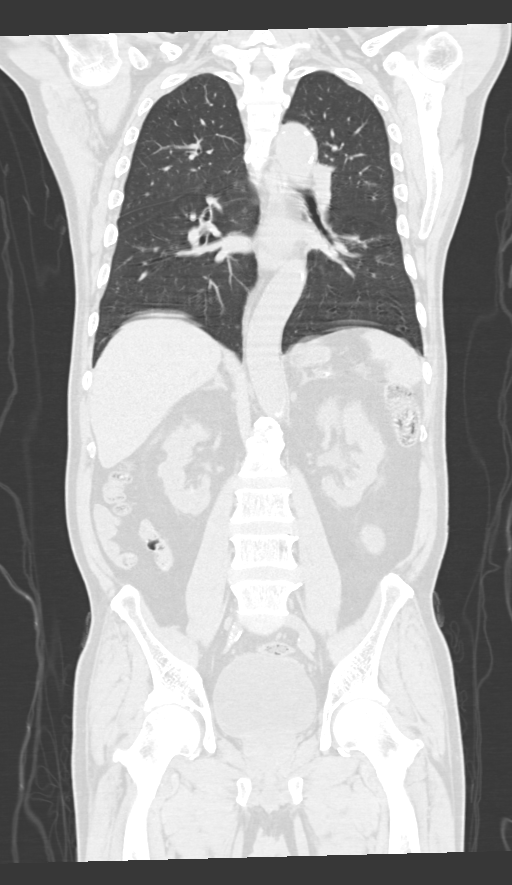
[im 92/153  lung]
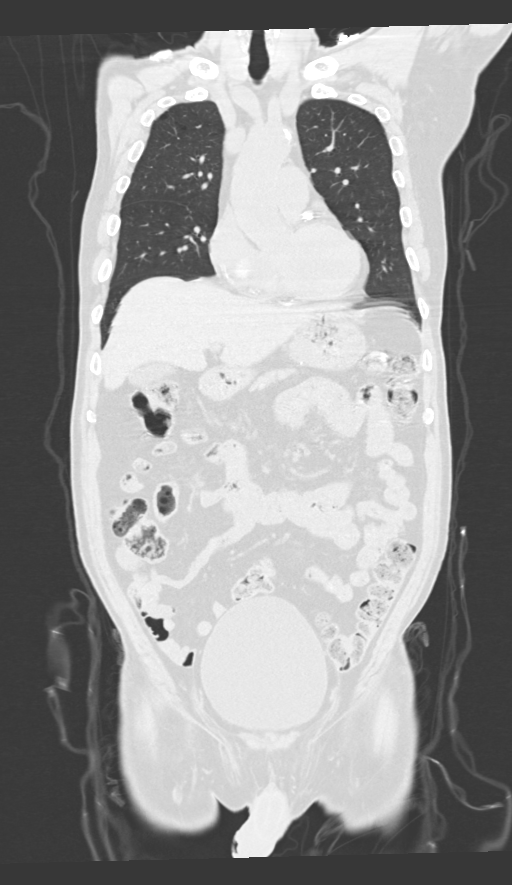

[13 of 36 positions shown; findings below may reference images not displayed]

FINDINGS: CT CHEST FINDINGS

Cardiovascular: No acute findings. Aortic and coronary artery
atherosclerosis noted.

Mediastinum/Lymph Nodes: No masses or pathologically enlarged lymph
nodes identified on this unenhanced exam.

Lungs/Pleura: Focal area of patchy ground-glass opacity is seen in
the posterior right upper lobe on images 58 in 59/series 5. No other
areas of pulmonary infiltrate, nodules, or masses identified. No
evidence of pleural effusion.

Musculoskeletal:  No suspicious bone lesions identified.

CT ABDOMEN AND PELVIS FINDINGS

Hepatobiliary: No masses visualized on this unenhanced exam.
Gallbladder is unremarkable. No evidence of biliary ductal
dilatation.

Pancreas: No mass or inflammatory changes identified on this
unenhanced exam.

Spleen:  Within normal limits in size.

Adrenals/Urinary Tract: A few tiny 1-2 mm renal calculi are noted,
however there is no evidence of ureteral calculi or hydronephrosis.
Unremarkable appearance of bladder.

Stomach/Bowel: No evidence of obstruction, inflammatory process, or
abnormal fluid collections. Normal appendix visualized.

Vascular/Lymphatic: No pathologically enlarged lymph nodes
identified. No abdominal aortic aneurysm. Aortic atherosclerosis
noted.

Reproductive:  Mildly enlarged prostate.

Other:  None.

Musculoskeletal:  No suspicious bone lesions identified.
IMPRESSION: Focal ground-glass opacity in posterior right upper lobe, likely
infectious or inflammatory in etiology although low-grade
adenocarcinoma cannot definitely be excluded. Recommend continued
follow-up by chest CT in 6 months. This recommendation follows the
consensus statement: Guidelines for Management of Small Pulmonary
Nodules Detected on CT Images: From the [HOSPITAL] [0T];

No acute findings within the abdomen or pelvis.

Bilateral nephrolithiasis. No evidence of ureteral calculi or
hydronephrosis.

Mildly enlarged prostate.

Coronary artery atherosclerosis.

## 2020-03-19 NOTE — Progress Notes (Addendum)
Patient speaks [name] only. Nurse assisted me (as she speaks [name]) in asking if he has chest pain. He stated very briefly a few minutes ago but went away quickly. Patient informed to tell nurse if continues to have chest pain. Awaiting Plavix washout. CABG scheduled for 03/24/2020 with Dr. [name].  Agree with above Mild chest pain resolved with rest Scheduled for early next week Chantel Teti O Decarla Siemen

## 2020-03-19 NOTE — Progress Notes (Signed)
ANTICOAGULATION CONSULT NOTE  Pharmacy Consult for heparin Indication:  CAD awaiting CABG  No Known Allergies  Patient Measurements: Height: 5\' 8"  (172.7 cm) Weight: 65.9 kg (145 lb 4.5 oz) IBW/kg (Calculated) : 68.4 Vital Signs: Temp: 98.8 F (37.1 C) (07/10 0818) Temp Source: Oral (07/10 0818) BP: 116/68 (07/10 0925) Pulse Rate: 90 (07/10 0925)  Labs: Recent Labs    03/17/20 0232 03/17/20 0232 03/18/20 1017 03/18/20 1017 03/18/20 2015 03/19/20 0224 03/19/20 1035  HGB 8.5*   < > 7.7*   < > 8.8* 9.9*  --   HCT 28.1*   < > 24.6*  --  27.9* 31.5*  --   PLT 403*   < > 351  --  325 378  --   LABPROT  --   --   --   --   --  13.8  --   INR  --   --   --   --   --  1.1  --   HEPARINUNFRC  --   --  0.30  --   --  0.92* 0.68  CREATININE 1.18  --  1.17  --   --  1.21  --    < > = values in this interval not displayed.    Estimated Creatinine Clearance: 55.2 mL/min (by C-G formula based on SCr of 1.21 mg/dL).  Assessment: 6 yoM s/p cath with 3vCAD. Surgery consult pending, IV heparin resumed.  Heparin level therapeutic at 0.68 but at higher end of goal, Hgb back up this AM.  No overt bleeding or complications noted.  Goal of Therapy:  Heparin level 0.3-0.7 units/ml Monitor platelets by anticoagulation protocol: Yes   Plan:  -Decrease IV heparin to 650 units/hr. -Repeat heparin level in 8 hrs -Daily heparin level and CBC.   [place] Crane, Roylene Reason, BCCP Clinical Pharmacist  03/19/2020 11:13 AM   [facility] pharmacy phone numbers are listed on [email or web address]

## 2020-03-19 NOTE — Progress Notes (Signed)
ANTICOAGULATION CONSULT NOTE  Pharmacy Consult for heparin Indication:  CAD awaiting CABG  No Known Allergies  Patient Measurements: Height: 5\' 8"  (172.7 cm) Weight: 65.9 kg (145 lb 4.5 oz) IBW/kg (Calculated) : 68.4 Vital Signs: Temp: 98.1 F (36.7 C) (07/10 1949) Temp Source: Oral (07/10 1949) BP: 100/63 (07/10 1949) Pulse Rate: 87 (07/10 1949)  Labs: Recent Labs    03/17/20 0232 03/17/20 0232 03/18/20 1017 03/18/20 1017 03/18/20 2015 03/19/20 0224 03/19/20 1035 03/19/20 1957  HGB 8.5*   < > 7.7*   < > 8.8* 9.9*  --   --   HCT 28.1*   < > 24.6*  --  27.9* 31.5*  --   --   PLT 403*   < > 351  --  325 378  --   --   LABPROT  --   --   --   --   --  13.8  --   --   INR  --   --   --   --   --  1.1  --   --   HEPARINUNFRC  --   --  0.30   < >  --  0.92* 0.68 0.46  CREATININE 1.18  --  1.17  --   --  1.21  --   --    < > = values in this interval not displayed.    Estimated Creatinine Clearance: 55.2 mL/min (by C-G formula based on SCr of 1.21 mg/dL).  Assessment: 56 yoM s/p cath with 3vCAD. Surgery consult pending, IV heparin resumed.  Heparin level remains therapeutic at 0.46 on 650 units/hr, Hgb back up this AM. No bleeding noted   Goal of Therapy:  Heparin level 0.3-0.7 units/ml Monitor platelets by anticoagulation protocol: Yes   Plan:  -Continue IV heparin at 650 units/hr. -Daily heparin level and CBC.   Albertina Parr, PharmD., BCPS, BCCCP Clinical Pharmacist Clinical phone for 03/19/20 until 10pm: [phone] If after 10pm, please refer to [facility] for unit-specific pharmacist

## 2020-03-19 NOTE — Progress Notes (Signed)
ANTICOAGULATION CONSULT NOTE - Follow Up Consult  Pharmacy Consult for heparin Indication: CAD awaiting CABG  Labs: Recent Labs    03/16/20 0356 03/17/20 0232 03/17/20 0232 03/18/20 1017 03/18/20 1017 03/18/20 2015 03/19/20 0224  HGB  --  8.5*   < > 7.7*   < > 8.8* 9.9*  HCT  --  28.1*   < > 24.6*  --  27.9* 31.5*  PLT  --  403*   < > 351  --  325 378  LABPROT  --   --   --   --   --   --  13.8  INR  --   --   --   --   --   --  1.1  HEPARINUNFRC  --   --   --  0.30  --   --  0.92*  CREATININE  --  1.18  --  1.17  --   --  1.21  TROPONINIHS 13  --   --   --   --   --   --    < > = values in this interval not displayed.    Assessment: 69yo male supratherapeutic on heparin after rate increase; no gtt issues or signs of bleeding per RN.  Goal of Therapy:  Heparin level 0.3-0.7 units/ml   Plan:  Will decrease heparin gtt by 1-2 units/kg/hr to 700 units/hr and check level in 6 hours.    Wynona Neat, PharmD, BCPS  03/19/2020,3:35 AM

## 2020-03-19 NOTE — Progress Notes (Signed)
Progress Note  Patient Name: Jimmy Blankenship Date of Encounter: 03/19/2020  [facility] [address] Cardiologist: Nahser   Subjective   69 year old gentleman with a history of coronary artery disease-status post stenting in the past. He presented with symptoms of unstable angina. Heart catheterization yesterday reveals three-vessel coronary artery disease with moderate LV dysfunction.  He has anteroapical dyskinesis. He has been referred to CV surgery- seen by Dr [name].  [place] [phone] served as our [name] interpreter ( via [employer] services)  No cp. 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  We discussed the fact that he is on iv heparin and I would not recommend going home before surgery   Inpatient Medications    Scheduled Meds: . aspirin  81 mg Oral Daily  . atorvastatin  80 mg Oral Daily  . insulin aspart  0-5 Units Subcutaneous QHS  . insulin aspart  0-9 Units Subcutaneous TID WC  . losartan  50 mg Oral Daily  . metoprolol succinate  25 mg Oral Daily  . pantoprazole (PROTONIX) IV  40 mg Intravenous Q24H  . sodium chloride flush  3 mL Intravenous Once  . sodium chloride flush  3 mL Intravenous Q12H   Continuous Infusions: . sodium chloride    . heparin 700 Units/hr (03/19/20 0818)   PRN Meds: sodium chloride, acetaminophen, magnesium hydroxide, nitroGLYCERIN, ondansetron (ZOFRAN) IV, oxyCODONE, sodium chloride flush   Vital Signs    Vitals:   03/19/20 0300 03/19/20 0314 03/19/20 0818 03/19/20 0925  BP:  (!) 117/57  116/68  Pulse: 89 85  90  Resp: 14 17    Temp:  98.8 F (37.1 C) 98.8 F (37.1 C)   TempSrc:  Oral Oral   SpO2: 97% 98%    Weight:      Height:        Intake/Output Summary (Last 24 hours) at 03/19/2020 0944 Last data filed at 03/19/2020 0926 Gross per 24 hour  Intake 901.75 ml  Output 2700 ml  Net -1798.25 ml   Last 3 Weights 03/16/2020 03/15/2020  Weight (lbs) 145 lb 4.5 oz 150 lb   Weight (kg) 65.9 kg 68.04 kg      Telemetry    SR  - Personally Reviewed  ECG     no new tracing - Personally Reviewed  Physical Exam   GEN: No acute distress.   Neck: No JVD Cardiac: RRR, no murmurs, rubs, or gallops.  Respiratory: Clear to auscultation bilaterally. GI: Soft, nontender, non-distended  MS:  no edema.  R radial cath site looks good  Neuro:  Nonfocal  Psych: Normal affect   Labs    High Sensitivity Troponin:   Recent Labs  Lab 03/15/20 2312 03/16/20 0117 03/16/20 0356  TROPONINIHS 13 12 13       Chemistry Recent Labs  Lab 03/17/20 0232 03/18/20 1017 03/19/20 0224  NA 137 137 137  K 4.3 3.9 4.0  CL 105 109 106  CO2 23 22 21*  GLUCOSE 106* 222* 166*  BUN 24* 16 19  CREATININE 1.18 1.17 1.21  CALCIUM 8.9 8.0* 8.6*  PROT  --   --  6.0*  ALBUMIN  --   --  3.1*  AST  --   --  20  ALT  --   --  20  ALKPHOS  --   --  61  BILITOT  --   --  1.1  GFRNONAA >60 >60 >60  GFRAA >60 >60 >60  ANIONGAP 9 6 10      Hematology Recent Labs  Lab 03/18/20 1017 03/18/20 2015 03/19/20 0224  WBC 5.3 5.2 6.7  RBC 2.96* 3.35* 3.74*  HGB 7.7* 8.8* 9.9*  HCT 24.6* 27.9* 31.5*  MCV 83.1 83.3 84.2  MCH 26.0 26.3 26.5  MCHC 31.3 31.5 31.4  RDW 13.8 14.0 13.9  PLT 351 325 378    BNPNo results for input(s): BNP, PROBNP in the last 168 hours.   DDimer No results for input(s): DDIMER in the last 168 hours.   Radiology    CARDIAC CATHETERIZATION  Result Date: 03/17/2020  Severe diffuse three-vessel coronary artery disease in the 69 year old [name] gentleman with diabetes mellitus type 2.  20% mid body left main disease.  Proximal LAD stent with mid body 40 to 60% restenosis depending upon review.  Severe multifocal diffuse mid to apical LAD.  Large diagonal with ostial 50% narrowing and 60 to 70% mid narrowing.  Severe diffuse disease circumflex coronary disease with proximal to mid body circumflex up to 90% narrowed.  Each of 3 obtuse marginals have  high-grade obstructive disease.  The second and third obtuse marginals are large enough to be grafted.  Dominant right coronary with moderate diffuse disease including smaller distal branches.  Eccentric 50 to 70% healed ulcerated plaque in the mid vessel above the proximal margin of the previously placed stent.  There is distal high-grade in-stent restenosis that is somewhat hazy possibly representing thrombus.  PDA contains proximal 80% stenosis.  There is apical akinesis/dyskinesis with EF in the 35 to 40% range.  LVEDP is normal. RECOMMENDATIONS:  T CTS consultation to determine if the patient has surgical options.  The LAD is not graftable and his distribution appears to be completely infarcted based upon the ventriculogram.  Grafting of the diagonal to obtuse marginals and PDA seems possible.  If he is not a surgical candidate, there are interventional options for the right coronary which could be the patient's culprit for this presentation although symptoms are somewhat vague and include palpitations.  ECHOCARDIOGRAM COMPLETE  Result Date: 03/18/2020    ECHOCARDIOGRAM REPORT   Patient Name:   Jimmy Blankenship Date of Exam: 03/18/2020 Medical Rec #:  [id number]     Height:       68.0 in Accession #:    [id number]    Weight:       145.3 lb Date of Birth:  [birth year]     BSA:          1.784 m Patient Age:    9 years      BP:           132/64 mmHg Patient Gender: M             HR:           95 bpm. Exam Location:  Inpatient Procedure: 2D Echo, Cardiac Doppler, Color Doppler and Intracardiac            Opacification Agent Indications:    pre-op evaluation  History:        Patient has no prior history of Echocardiogram examinations.                 CAD, Signs/Symptoms:Chest Pain; Risk Factors:Hypertension,                 Diabetes, Dyslipidemia and Former Smoker.  Sonographer:    Vickie Epley RDCS Referring Phys: Delta  1. LVEF is depressed  with hypokinesis/akinesis of the distal inferior,  mid/distal septal, distal lataral and apical walls.. Left ventricular ejection fraction, by estimation, is 35%. The left ventricle has moderately decreased function. The left ventricular internal cavity size was mildly to moderately dilated.  2. Right ventricular systolic function is normal. The right ventricular size is normal.  3. The mitral valve is normal in structure. Trivial mitral valve regurgitation.  4. The aortic valve is normal in structure. Aortic valve regurgitation is not visualized.  5. The inferior vena cava is normal in size with greater than 50% respiratory variability, suggesting right atrial pressure of 3 mmHg. FINDINGS  Left Ventricle: LVEF is depressed with hypokinesis/akinesis of the distal inferior, mid/distal septal, distal lataral and apical walls. Left ventricular ejection fraction, by estimation, is 35%%. The left ventricle has moderately decreased function. The  left ventricle demonstrates regional wall motion abnormalities. Definity contrast agent was given IV to delineate the left ventricular endocardial borders. The left ventricular internal cavity size was mildly to moderately dilated. There is no left ventricular hypertrophy. Left ventricular diastolic parameters are consistent with Grade I diastolic dysfunction (impaired relaxation). Right Ventricle: The right ventricular size is normal. No increase in right ventricular wall thickness. Right ventricular systolic function is normal. Left Atrium: Left atrial size was normal in size. Right Atrium: Right atrial size was normal in size. Pericardium: There is no evidence of pericardial effusion. Mitral Valve: The mitral valve is normal in structure. Trivial mitral valve regurgitation. Tricuspid Valve: The tricuspid valve is normal in structure. Tricuspid valve regurgitation is trivial. Aortic Valve: The aortic valve is normal in structure. Aortic valve regurgitation is not visualized. Pulmonic Valve: The pulmonic valve was not well  visualized. Pulmonic valve regurgitation is not visualized. Aorta: The aortic root is normal in size and structure. Venous: The inferior vena cava is normal in size with greater than 50% respiratory variability, suggesting right atrial pressure of 3 mmHg. IAS/Shunts: No atrial level shunt detected by color flow Doppler.  LEFT VENTRICLE PLAX 2D LVIDd:         5.50 cm LVIDs:         4.20 cm LV PW:         0.90 cm LV IVS:        0.90 cm LVOT diam:     2.10 cm LV SV:         59 LV SV Index:   33 LVOT Area:     3.46 cm  LV Volumes (MOD) LV vol d, MOD A4C: 126.0 ml LV vol s, MOD A4C: 93.6 ml LV SV MOD A4C:     126.0 ml RIGHT VENTRICLE TAPSE (M-mode): 1.9 cm LEFT ATRIUM             Index       RIGHT ATRIUM          Index LA diam:        3.30 cm 1.85 cm/m  RA Area:     9.31 cm LA Vol (A2C):   31.9 ml 17.88 ml/m RA Volume:   17.20 ml 9.64 ml/m LA Vol (A4C):   26.8 ml 15.02 ml/m LA Biplane Vol: 30.1 ml 16.87 ml/m  AORTIC VALVE LVOT Vmax:   102.00 cm/s LVOT Vmean:  65.400 cm/s LVOT VTI:    0.171 m  AORTA Ao Root diam: 3.30 cm  SHUNTS Systemic VTI:  0.17 m Systemic Diam: 2.10 cm Dorris Carnes MD Electronically signed by Dorris Carnes MD Signature Date/Time: 03/18/2020/12:08:03 PM    Final  VAS US DOPPLER PRE CABG  Result Date: 03/18/2020 PREOPERATIVE VASCULAR EVALUATION  Indications:      Pre-CABG. Risk Factors:     Hypertension, hyperlipidemia, Diabetes. Comparison Study: no prior Performing Technologist: Abram Sander RVS  Examination Guidelines: A complete evaluation includes B-mode imaging, spectral Doppler, color Doppler, and power Doppler as needed of all accessible portions of each vessel. Bilateral testing is considered an integral part of a complete examination. Limited examinations for reoccurring indications may be performed as noted.  Right Carotid Findings: +----------+--------+--------+--------+------------+--------+           PSV cm/sEDV cm/sStenosisDescribe    Comments  +----------+--------+--------+--------+------------+--------+ CCA Prox  96      14              heterogenous         +----------+--------+--------+--------+------------+--------+ CCA Distal52      9               heterogenous         +----------+--------+--------+--------+------------+--------+ ICA Prox  62      17      1-39%   heterogenous         +----------+--------+--------+--------+------------+--------+ ICA Distal66      23                                   +----------+--------+--------+--------+------------+--------+ ECA       109     7                                    +----------+--------+--------+--------+------------+--------+ Portions of this table do not appear on this page. +----------+--------+-------+--------+------------+           PSV cm/sEDV cmsDescribeArm Pressure +----------+--------+-------+--------+------------+ Subclavian93                                  +----------+--------+-------+--------+------------+ +---------+--------+--+--------+--+---------+ VertebralPSV cm/s50EDV cm/s15Antegrade +---------+--------+--+--------+--+---------+ Left Carotid Findings: +----------+--------+--------+--------+------------+--------+           PSV cm/sEDV cm/sStenosisDescribe    Comments +----------+--------+--------+--------+------------+--------+ CCA Prox  98      10              heterogenous         +----------+--------+--------+--------+------------+--------+ CCA Distal49      9               heterogenous         +----------+--------+--------+--------+------------+--------+ ICA Prox  69      27      1-39%   heterogenous         +----------+--------+--------+--------+------------+--------+ ICA Distal62      30                                   +----------+--------+--------+--------+------------+--------+ ECA       93      9                                     +----------+--------+--------+--------+------------+--------+ +----------+--------+--------+--------+------------+ SubclavianPSV cm/sEDV cm/sDescribeArm Pressure +----------+--------+--------+--------+------------+           102                                  +----------+--------+--------+--------+------------+ +---------+--------+--+--------+--+---------+  VertebralPSV cm/s44EDV cm/s15Antegrade +---------+--------+--+--------+--+---------+  ABI Findings: +--------+------------------+-----+---------+--------+ Right   Rt Pressure (mmHg)IndexWaveform Comment  +--------+------------------+-----+---------+--------+ [id number]                    triphasic         +--------+------------------+-----+---------+--------+ PTA     163               1.38 triphasic         +--------+------------------+-----+---------+--------+ DP      131               1.11 triphasic         +--------+------------------+-----+---------+--------+ +--------+------------------+-----+---------+-------+ Left    Lt Pressure (mmHg)IndexWaveform Comment +--------+------------------+-----+---------+-------+ [id number]                    triphasic        +--------+------------------+-----+---------+-------+ PTA     148               1.25 triphasic        +--------+------------------+-----+---------+-------+ DP      145               1.23 triphasic        +--------+------------------+-----+---------+-------+ +-------+---------------+----------------+ ABI/TBIToday's ABI/TBIPrevious ABI/TBI +-------+---------------+----------------+ Right  1.38                            +-------+---------------+----------------+ Left   1.25                            +-------+---------------+----------------+  Right Doppler Findings: +--------+--------+-----+---------+--------+ Site    PressureIndexDoppler  Comments +--------+--------+-----+---------+--------+ [id number]           triphasic         +--------+--------+-----+---------+--------+ Radial               triphasic         +--------+--------+-----+---------+--------+ Ulnar                triphasic         +--------+--------+-----+---------+--------+  Left Doppler Findings: +--------+--------+-----+---------+--------+ Site    PressureIndexDoppler  Comments +--------+--------+-----+---------+--------+ [id number]          triphasic         +--------+--------+-----+---------+--------+ Radial               triphasic         +--------+--------+-----+---------+--------+ Ulnar                triphasic         +--------+--------+-----+---------+--------+  Summary: Right Carotid: Velocities in the right ICA are consistent with a 1-39% stenosis. Left Carotid: Velocities in the left ICA are consistent with a 1-39% stenosis. Vertebrals: Bilateral vertebral arteries demonstrate antegrade flow. Right ABI: Resting right ankle-brachial index indicates noncompressible right lower extremity arteries. Left ABI: Resting left ankle-brachial index is within normal range. No evidence of significant left lower extremity arterial disease. Right Upper Extremity: Doppler waveform obliterate with right radial compression. Doppler waveforms remain within normal limits with right ulnar compression. Left Upper Extremity: Doppler waveforms remain within normal limits with left radial compression. Doppler waveforms remain within normal limits with left ulnar compression.  Electronically signed by Harold Barban MD on 03/18/2020 at 5:18:24 PM.    Final     Cardiac Studies      Patient Profile   69 y.o. male with known CAD, has had progression of his cad and  now has been referred to surgery   Assessment & Plan    Coronary artery disease.  The patient has severe three-vessel coronary artery disease.  He has anteroapical akinesis.  I agree with Dr. Tamala Julian that his best option is for bypass surgery.  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   He was seen by Dr Dr [name] - Patient will be pared for multivessel coronary bypass grafting with left IMA plan to the LAD and vein grafts to the PDA and OM.  Surgery will take place next week after Plavix washout, probably July 15.  - continue iv Heparin - obtain P2Y12 - continue ASA, atorvastatin, metoprolol and losartan, vitals are stable  Ischemic cardiomyopathy - he is euvolemic  Hyperlipidemia: Labs from the [facility] lab from March 04, 2020 reveal total cholesterol of 126.  The triglycerides are 71.  The HDL is 38.  LDL 73.  Continue atorvastatin 40 mg a day.  Hypertension: Continue current medications.  Hyperlipidemia: Continue current medications.  For questions or updates, please contact [place] Please consult [email or web address] for contact info under   Signed, Ena Dawley, MD  03/19/2020, 9:44 AM

## 2020-03-19 NOTE — Plan of Care (Signed)

## 2020-03-19 NOTE — Progress Notes (Signed)
ANTICOAGULATION CONSULT NOTE  Pharmacy Consult for heparin Indication:  CAD awaiting CABG  No Known Allergies  Patient Measurements: Height: 5\' 8"  (172.7 cm) Weight: 65.9 kg (145 lb 4.5 oz) IBW/kg (Calculated) : 68.4 Vital Signs: Temp: 98.1 F (36.7 C) (07/10 1949) Temp Source: Oral (07/10 1949) BP: 100/63 (07/10 1949) Pulse Rate: 87 (07/10 1949)  Labs: Recent Labs    03/17/20 0232 03/17/20 0232 03/18/20 1017 03/18/20 1017 03/18/20 2015 03/19/20 0224 03/19/20 1035 03/19/20 1957  HGB 8.5*   < > 7.7*   < > 8.8* 9.9*  --   --   HCT 28.1*   < > 24.6*  --  27.9* 31.5*  --   --   PLT 403*   < > 351  --  325 378  --   --   LABPROT  --   --   --   --   --  13.8  --   --   INR  --   --   --   --   --  1.1  --   --   HEPARINUNFRC  --   --  0.30   < >  --  0.92* 0.68 0.46  CREATININE 1.18  --  1.17  --   --  1.21  --   --    < > = values in this interval not displayed.    Estimated Creatinine Clearance: 55.2 mL/min (by C-G formula based on SCr of 1.21 mg/dL).  Assessment: 48 yoM s/p cath with 3vCAD. Surgery consult pending, IV heparin resumed.  Heparin level therapeutic at 0.46. Hgb back up this AM.  No overt bleeding or complications noted.  Goal of Therapy:  Heparin level 0.3-0.7 units/ml Monitor platelets by anticoagulation protocol: Yes   Plan:  -Continue IV heparin at 650 units/hr. -Daily heparin level and CBC.   Erin Hearing PharmD., BCPS Clinical Pharmacist 03/19/2020 8:52 PM    [facility] pharmacy phone numbers are listed on [email or web address]

## 2020-03-19 NOTE — Progress Notes (Signed)
PROGRESS NOTE    Jimmy Blankenship  MCN:[id number] DOB: 11-08-1950 DOA: 03/16/2020 PCP: [name], MD   Brief Narrative: Jimmy Blankenship is a 69 y.o. [name] speaking  male with medical history significant of coronary artery disease disease status post stenting, diabetes type 2, hypertension, hyperlipidemia, normocytic anemia who presents to the emergency department at [facility] with complaints of chest pain.  He was sent from [facility] to [facility] for further evaluation due to typical symptoms.  Troponins were negative.  High suspicion for unstable angina.    Also noted to have significant normocytic anemia with positive FOBT.  He underwent cardiac cath with finding of severe triple-vessel disease.  Cardiothoracic surgery consulted for possible CABG and he is scheduled on 03/24/2020 as he has to be off of Plavix for 5 days.  Assessment & Plan:   Principal Problem:   Nonspecific chest pain Active Problems:   CAD (coronary artery disease)   DM2 (diabetes mellitus, type 2) (HCC)   HTN (hypertension)   HLD (hyperlipidemia)   Normocytic anemia   AKI (acute kidney injury) ([place])   Chest pain    Unstable angina/typical chest pain/CAD: His pain was associated with exertion and relieved with nitroglycerin.  Had brief episode of chest pain early this morning but currently chest pain-free.  EKG did not show any ischemic changes.  Showed normal sinus rhythm with occasional PVCs.  On heparin drip. History of stent placement about 11 years  ago in [place].  Follows with cardiology at [facility].  Takes Plavix, metoprolol, Lipitor, losartan .  Cardiac cath done here which showed severe diffuse three-vessel coronary disease with decreased left ventricular function with ejection fraction of 40%.  Cardiology recommending possible surgical revascularization.  Cardiothoracic surgery consulted.  The plan to do CABG on 03/24/2020.  Plavix was discontinued 03/18/2020.  Aspirin added.  Echo shows decreased  ejection fraction with hypokinesis/akinesis of distal inferior, mid/distal septal, distal lateral and apical walls.  EF 35%.  AKI: His baseline creatinine ranges from 1.1-1.2.  Elevated creatinine on presentation.  Now resolved.  Normocytic anemia: Baseline hemoglobin in the range of 7.  No history of melena or hematochezia.  He takes Plavix.  Positive stool occult blood.  Low iron as per iron studies.  High suspicion for GI bleed.  He has an appointment with gastroenterology at [facility].  GI consulted here and recommended to follow-up with his gastroenterologist as an outpatient for further work-up, no EGD/Colo planned here.  Recommended to continue Protonix. S/P a dose of IV iron.  Hemoglobin dropped to 7.7 on 03/18/2020 and he received 1 unit of peritransplant.  Posttransfusion hemoglobin today is 9.9.  Essential hypertension: Currently blood pressure stable.  Continue current medications.  Monitor blood pressure  Hyperlipidemia: On Lipitor  Right lung upper lobe opacity: CT chest without contrast shows focal groundglass opacity in posterior right upper lobe.  This could be inflammatory or infectious and low-grade adenocarcinoma cannot definitely be excluded.  He has no leukocytosis or any respiratory symptoms and he is afebrile.  I doubt pneumonia.  Follow-up CT in 6 months is recommended.  Diabetes type 2: On glipizide and Metformin at home.  Continue sliding-scale insulin here.  Monitor CBGs.      DVT prophylaxis: Heparin GTT Code Status: Full Family Communication: daughter and wife at bed side Status is: Observation  Status is: Inpatient  Remains inpatient appropriate because:Ongoing diagnostic testing needed not appropriate for outpatient work up   Dispo: The patient  is from: Home              Anticipated d/c is to: Home              Anticipated d/c date is: > 3 days              Patient currently is not medically stable to d/c.   Consultants: Cardiology, gastroenterology,  cardiothoracic surgery  Procedures:None  Antimicrobials:  Anti-infectives (From admission, onward)   None      Subjective: Patient seen and examined.  Wife and daughter present at bedside.  Patient's nurse also speaks [name] language who helps with interpretation and daughter also is bilingual.  Reportedly, patient had brief episode of chest pain this morning which relieved on its own without any treatment and he has been chest pain free since then and he does not have any other complaint.  He is aware of the plan of surgery on 03/24/2020.  Objective: Vitals:   03/19/20 0314 03/19/20 0818 03/19/20 0925 03/19/20 1129  BP: (!) 117/57  116/68 109/70  Pulse: 85  90 86  Resp: 17   20  Temp: 98.8 F (37.1 C) 98.8 F (37.1 C)  98.4 F (36.9 C)  TempSrc: Oral Oral  Oral  SpO2: 98%   98%  Weight:      Height:        Intake/Output Summary (Last 24 hours) at 03/19/2020 1305 Last data filed at 03/19/2020 0926 Gross per 24 hour  Intake 901.75 ml  Output 1800 ml  Net -898.25 ml   Filed Weights   03/15/20 2310 03/16/20 1628  Weight: 68 kg 65.9 kg    Examination:  General exam: Appears calm and comfortable  Respiratory system: Clear to auscultation. Respiratory effort normal. Cardiovascular system: S1 & S2 heard, RRR. No JVD, murmurs, rubs, gallops or clicks. No pedal edema. Gastrointestinal system: Abdomen is nondistended, soft and nontender. No organomegaly or masses felt. Normal bowel sounds heard. Central nervous system: Alert and oriented. No focal neurological deficits. Extremities: Symmetric 5 x 5 power. Skin: No rashes, lesions or ulcers.  Psychiatry: Judgement and insight appear normal. Mood & affect appropriate.   Data Reviewed: I have personally reviewed following labs and imaging studies  CBC: Recent Labs  Lab 03/15/20 2312 03/17/20 0232 03/18/20 1017 03/18/20 2015 03/19/20 0224  WBC 7.8 8.0 5.3 5.2 6.7  NEUTROABS  --  4.2 3.6  --   --   HGB 8.7* 8.5* 7.7*  8.8* 9.9*  HCT 27.7* 28.1* 24.6* 27.9* 31.5*  MCV 83.4 83.1 83.1 83.3 84.2  PLT 397 403* 351 325 161   Basic Metabolic Panel: Recent Labs  Lab 03/15/20 2312 03/17/20 0232 03/18/20 1017 03/19/20 0224  NA 138 137 137 137  K 4.3 4.3 3.9 4.0  CL 103 105 109 106  CO2 23 23 22  21*  GLUCOSE 164* 106* 222* 166*  BUN 36* 24* 16 19  CREATININE 1.40* 1.18 1.17 1.21  CALCIUM 9.0 8.9 8.0* 8.6*   GFR: Estimated Creatinine Clearance: 55.2 mL/min (by C-G formula based on SCr of 1.21 mg/dL). Liver Function Tests: Recent Labs  Lab 03/19/20 0224  AST 20  ALT 20  ALKPHOS 61  BILITOT 1.1  PROT 6.0*  ALBUMIN 3.1*   No results for input(s): LIPASE, AMYLASE in the last 168 hours. No results for input(s): AMMONIA in the last 168 hours. Coagulation Profile: Recent Labs  Lab 03/19/20 0224  INR 1.1   Cardiac Enzymes: No results for input(s): CKTOTAL, CKMB,  CKMBINDEX, TROPONINI in the last 168 hours. BNP (last 3 results) No results for input(s): PROBNP in the last 8760 hours. HbA1C: No results for input(s): HGBA1C in the last 72 hours. CBG: Recent Labs  Lab 03/18/20 1106 03/18/20 1632 03/18/20 2116 03/19/20 0609 03/19/20 1131  GLUCAP 172* 182* 199* 139* 186*   Lipid Profile: No results for input(s): CHOL, HDL, LDLCALC, TRIG, CHOLHDL, LDLDIRECT in the last 72 hours. Thyroid Function Tests: Recent Labs    03/19/20 0224  TSH 1.167   Anemia Panel: Recent Labs    03/17/20 0232  FERRITIN 8*  TIBC 409  IRON 27*   Sepsis Labs: No results for input(s): PROCALCITON, LATICACIDVEN in the last 168 hours.  Recent Results (from the past 240 hour(s))  SARS Coronavirus 2 by RT PCR (hospital order, performed in [facility] hospital lab) Nasopharyngeal Nasopharyngeal Swab     Status: None   Collection Time: 03/16/20  2:40 AM   Specimen: Nasopharyngeal Swab  Result Value Ref Range Status   SARS Coronavirus 2 NEGATIVE NEGATIVE Final    Comment: (NOTE) SARS-CoV-2 target nucleic acids  are NOT DETECTED.  The SARS-CoV-2 RNA is generally detectable in upper and lower respiratory specimens during the acute phase of infection. The lowest concentration of SARS-CoV-2 viral copies this assay can detect is 250 copies / mL. A negative result does not preclude SARS-CoV-2 infection and should not be used as the sole basis for treatment or other patient management decisions.  A negative result may occur with improper specimen collection / handling, submission of specimen other than nasopharyngeal swab, presence of viral mutation(s) within the areas targeted by this assay, and inadequate number of viral copies (<250 copies / mL). A negative result must be combined with clinical observations, patient history, and epidemiological information.  Fact Sheet for Patients:   StrictlyIdeas.no  Fact Sheet for Healthcare Providers: BankingDealers.co.za  This test is not yet approved or  cleared by the [place] FDA and has been authorized for detection and/or diagnosis of SARS-CoV-2 by FDA under an Emergency Use Authorization (EUA).  This EUA will remain in effect (meaning this test can be used) for the duration of the COVID-19 declaration under Section 564(b)(1) of the Act, 21 U.S.C. section 360bbb-3(b)(1), unless the authorization is terminated or revoked sooner.  Performed at [facility], [address] [place], [place] [address]   MRSA PCR Screening     Status: None   Collection Time: 03/16/20  5:00 PM   Specimen: Nasal Mucosa; Nasopharyngeal  Result Value Ref Range Status   MRSA by PCR NEGATIVE NEGATIVE Final    Comment:        The GeneXpert MRSA Assay (FDA approved for NASAL specimens only), is one component of a comprehensive MRSA colonization surveillance program. It is not intended to diagnose MRSA infection nor to guide or monitor treatment for MRSA infections. Performed at [facility], [place] [address], [place], [place] [address]   Surgical pcr screen     Status: None   Collection Time: 03/18/20  4:27 PM   Specimen: Nasal Mucosa; Nasal Swab  Result Value Ref Range Status   MRSA, PCR NEGATIVE NEGATIVE Final   Staphylococcus aureus NEGATIVE NEGATIVE Final    Comment: (NOTE) The Xpert SA Assay (FDA approved for NASAL specimens in patients 101 years of age and older), is one component of a comprehensive surveillance program. It is not intended to diagnose infection nor to guide or monitor treatment. Performed at [facility], [place] [address] [place], [place] [address]          Radiology Studies: CT ABDOMEN PELVIS WO CONTRAST  Result Date: 03/19/2020 CLINICAL DATA:  Anemia due to chronic blood loss. Ischemic cardiomyopathy. EXAM: CT CHEST, ABDOMEN AND PELVIS WITHOUT CONTRAST TECHNIQUE: Multidetector CT imaging of the chest, abdomen and pelvis was performed following the standard protocol without IV contrast. COMPARISON:  None. FINDINGS: CT CHEST FINDINGS Cardiovascular: No acute findings. Aortic and coronary artery atherosclerosis noted. Mediastinum/Lymph Nodes: No masses or pathologically enlarged lymph nodes identified on this unenhanced exam. Lungs/Pleura: Focal area of patchy ground-glass opacity is seen in the posterior right upper lobe on images 58 in 59/series 5. No other areas of pulmonary infiltrate, nodules, or masses identified. No evidence of pleural effusion. Musculoskeletal:  No suspicious bone lesions identified. CT ABDOMEN AND PELVIS FINDINGS Hepatobiliary: No masses visualized on this unenhanced exam. Gallbladder is unremarkable. No evidence of biliary ductal dilatation. Pancreas: No mass or inflammatory changes identified on this unenhanced exam. Spleen:  Within normal limits in size. Adrenals/Urinary Tract: A few tiny 1-2 mm renal calculi are noted, however there is no evidence of ureteral calculi or hydronephrosis. Unremarkable appearance of bladder. Stomach/Bowel:  No evidence of obstruction, inflammatory process, or abnormal fluid collections. Normal appendix visualized. Vascular/Lymphatic: No pathologically enlarged lymph nodes identified. No abdominal aortic aneurysm. Aortic atherosclerosis noted. Reproductive:  Mildly enlarged prostate. Other:  None. Musculoskeletal:  No suspicious bone lesions identified. IMPRESSION: Focal ground-glass opacity in posterior right upper lobe, likely infectious or inflammatory in etiology although low-grade adenocarcinoma cannot definitely be excluded. Recommend continued follow-up by chest CT in 6 months. This recommendation follows the consensus statement: Guidelines for Management of Small Pulmonary Nodules Detected on CT Images: From the Fleischner Society 2017; Radiology 2017; 284:228-243. No acute findings within the abdomen or pelvis. Bilateral nephrolithiasis. No evidence of ureteral calculi or hydronephrosis. Mildly enlarged prostate. Coronary artery atherosclerosis. Electronically Signed   By: Marlaine Hind M.D.   On: 03/19/2020 11:57   CT chest without contrast  Result Date: 03/19/2020 CLINICAL DATA:  Anemia due to chronic blood loss. Ischemic cardiomyopathy. EXAM: CT CHEST, ABDOMEN AND PELVIS WITHOUT CONTRAST TECHNIQUE: Multidetector CT imaging of the chest, abdomen and pelvis was performed following the standard protocol without IV contrast. COMPARISON:  None. FINDINGS: CT CHEST FINDINGS Cardiovascular: No acute findings. Aortic and coronary artery atherosclerosis noted. Mediastinum/Lymph Nodes: No masses or pathologically enlarged lymph nodes identified on this unenhanced exam. Lungs/Pleura: Focal area of patchy ground-glass opacity is seen in the posterior right upper lobe on images 58 in 59/series 5. No other areas of pulmonary infiltrate, nodules, or masses identified. No evidence of pleural effusion. Musculoskeletal:  No suspicious bone lesions identified. CT ABDOMEN AND PELVIS FINDINGS Hepatobiliary: No masses  visualized on this unenhanced exam. Gallbladder is unremarkable. No evidence of biliary ductal dilatation. Pancreas: No mass or inflammatory changes identified on this unenhanced exam. Spleen:  Within normal limits in size. Adrenals/Urinary Tract: A few tiny 1-2 mm renal calculi are noted, however there is no evidence of ureteral calculi or hydronephrosis. Unremarkable appearance of bladder. Stomach/Bowel: No evidence of obstruction, inflammatory process, or abnormal fluid collections. Normal appendix visualized. Vascular/Lymphatic: No pathologically enlarged lymph nodes identified. No abdominal aortic aneurysm. Aortic atherosclerosis noted. Reproductive:  Mildly enlarged prostate. Other:  None. Musculoskeletal:  No suspicious bone lesions identified. IMPRESSION: Focal ground-glass opacity in posterior right upper lobe, likely infectious or inflammatory in etiology although low-grade adenocarcinoma cannot definitely be excluded. Recommend continued follow-up  by chest CT in 6 months. This recommendation follows the consensus statement: Guidelines for Management of Small Pulmonary Nodules Detected on CT Images: From the Fleischner Society 2017; Radiology 2017; 284:228-243. No acute findings within the abdomen or pelvis. Bilateral nephrolithiasis. No evidence of ureteral calculi or hydronephrosis. Mildly enlarged prostate. Coronary artery atherosclerosis. Electronically Signed   By: Marlaine Hind M.D.   On: 03/19/2020 11:57   CARDIAC CATHETERIZATION  Result Date: 03/17/2020  Severe diffuse three-vessel coronary artery disease in the 69 year old [name] gentleman with diabetes mellitus type 2.  20% mid body left main disease.  Proximal LAD stent with mid body 40 to 60% restenosis depending upon review.  Severe multifocal diffuse mid to apical LAD.  Large diagonal with ostial 50% narrowing and 60 to 70% mid narrowing.  Severe diffuse disease circumflex coronary disease with proximal to mid body circumflex up to 90%  narrowed.  Each of 3 obtuse marginals have high-grade obstructive disease.  The second and third obtuse marginals are large enough to be grafted.  Dominant right coronary with moderate diffuse disease including smaller distal branches.  Eccentric 50 to 70% healed ulcerated plaque in the mid vessel above the proximal margin of the previously placed stent.  There is distal high-grade in-stent restenosis that is somewhat hazy possibly representing thrombus.  PDA contains proximal 80% stenosis.  There is apical akinesis/dyskinesis with EF in the 35 to 40% range.  LVEDP is normal. RECOMMENDATIONS:  T CTS consultation to determine if the patient has surgical options.  The LAD is not graftable and his distribution appears to be completely infarcted based upon the ventriculogram.  Grafting of the diagonal to obtuse marginals and PDA seems possible.  If he is not a surgical candidate, there are interventional options for the right coronary which could be the patient's culprit for this presentation although symptoms are somewhat vague and include palpitations.  ECHOCARDIOGRAM COMPLETE  Result Date: 03/18/2020    ECHOCARDIOGRAM REPORT   Patient Name:   Jimmy Blankenship Date of Exam: 03/18/2020 Medical Rec #:  [id number]     Height:       68.0 in Accession #:    [id number]    Weight:       145.3 lb Date of Birth:  [birth year]     BSA:          1.784 m Patient Age:    36 years      BP:           132/64 mmHg Patient Gender: M             HR:           95 bpm. Exam Location:  Inpatient Procedure: 2D Echo, Cardiac Doppler, Color Doppler and Intracardiac            Opacification Agent Indications:    pre-op evaluation  History:        Patient has no prior history of Echocardiogram examinations.                 CAD, Signs/Symptoms:Chest Pain; Risk Factors:Hypertension,                 Diabetes, Dyslipidemia and Former Smoker.  Sonographer:    Vickie Epley RDCS Referring Phys: [place]  1. LVEF is depressed with  hypokinesis/akinesis of the distal inferior, mid/distal septal, distal lataral and apical walls.. Left ventricular ejection fraction, by estimation, is 35%. The left ventricle has moderately decreased function. The left ventricular  internal cavity size was mildly to moderately dilated.  2. Right ventricular systolic function is normal. The right ventricular size is normal.  3. The mitral valve is normal in structure. Trivial mitral valve regurgitation.  4. The aortic valve is normal in structure. Aortic valve regurgitation is not visualized.  5. The inferior vena cava is normal in size with greater than 50% respiratory variability, suggesting right atrial pressure of 3 mmHg. FINDINGS  Left Ventricle: LVEF is depressed with hypokinesis/akinesis of the distal inferior, mid/distal septal, distal lataral and apical walls. Left ventricular ejection fraction, by estimation, is 35%%. The left ventricle has moderately decreased function. The  left ventricle demonstrates regional wall motion abnormalities. Definity contrast agent was given IV to delineate the left ventricular endocardial borders. The left ventricular internal cavity size was mildly to moderately dilated. There is no left ventricular hypertrophy. Left ventricular diastolic parameters are consistent with Grade I diastolic dysfunction (impaired relaxation). Right Ventricle: The right ventricular size is normal. No increase in right ventricular wall thickness. Right ventricular systolic function is normal. Left Atrium: Left atrial size was normal in size. Right Atrium: Right atrial size was normal in size. Pericardium: There is no evidence of pericardial effusion. Mitral Valve: The mitral valve is normal in structure. Trivial mitral valve regurgitation. Tricuspid Valve: The tricuspid valve is normal in structure. Tricuspid valve regurgitation is trivial. Aortic Valve: The aortic valve is normal in structure. Aortic valve regurgitation is not visualized. Pulmonic  Valve: The pulmonic valve was not well visualized. Pulmonic valve regurgitation is not visualized. Aorta: The aortic root is normal in size and structure. Venous: The inferior vena cava is normal in size with greater than 50% respiratory variability, suggesting right atrial pressure of 3 mmHg. IAS/Shunts: No atrial level shunt detected by color flow Doppler.  LEFT VENTRICLE PLAX 2D LVIDd:         5.50 cm LVIDs:         4.20 cm LV PW:         0.90 cm LV IVS:        0.90 cm LVOT diam:     2.10 cm LV SV:         59 LV SV Index:   33 LVOT Area:     3.46 cm  LV Volumes (MOD) LV vol d, MOD A4C: 126.0 ml LV vol s, MOD A4C: 93.6 ml LV SV MOD A4C:     126.0 ml RIGHT VENTRICLE TAPSE (M-mode): 1.9 cm LEFT ATRIUM             Index       RIGHT ATRIUM          Index LA diam:        3.30 cm 1.85 cm/m  RA Area:     9.31 cm LA Vol (A2C):   31.9 ml 17.88 ml/m RA Volume:   17.20 ml 9.64 ml/m LA Vol (A4C):   26.8 ml 15.02 ml/m LA Biplane Vol: 30.1 ml 16.87 ml/m  AORTIC VALVE LVOT Vmax:   102.00 cm/s LVOT Vmean:  65.400 cm/s LVOT VTI:    0.171 m  AORTA Ao Root diam: 3.30 cm  SHUNTS Systemic VTI:  0.17 m Systemic Diam: 2.10 cm Dorris Carnes MD Electronically signed by Dorris Carnes MD Signature Date/Time: 03/18/2020/12:08:03 PM    Final    VAS US DOPPLER PRE CABG  Result Date: 03/18/2020 PREOPERATIVE VASCULAR EVALUATION  Indications:      Pre-CABG. Risk Factors:     Hypertension, hyperlipidemia, Diabetes.  Comparison Study: no prior Performing Technologist: Abram Sander RVS  Examination Guidelines: A complete evaluation includes B-mode imaging, spectral Doppler, color Doppler, and power Doppler as needed of all accessible portions of each vessel. Bilateral testing is considered an integral part of a complete examination. Limited examinations for reoccurring indications may be performed as noted.  Right Carotid Findings: +----------+--------+--------+--------+------------+--------+           PSV cm/sEDV cm/sStenosisDescribe     Comments +----------+--------+--------+--------+------------+--------+ CCA Prox  96      14              heterogenous         +----------+--------+--------+--------+------------+--------+ CCA Distal52      9               heterogenous         +----------+--------+--------+--------+------------+--------+ ICA Prox  62      17      1-39%   heterogenous         +----------+--------+--------+--------+------------+--------+ ICA Distal66      23                                   +----------+--------+--------+--------+------------+--------+ ECA       109     7                                    +----------+--------+--------+--------+------------+--------+ Portions of this table do not appear on this page. +----------+--------+-------+--------+------------+           PSV cm/sEDV cmsDescribeArm Pressure +----------+--------+-------+--------+------------+ Subclavian93                                  +----------+--------+-------+--------+------------+ +---------+--------+--+--------+--+---------+ VertebralPSV cm/s50EDV cm/s15Antegrade +---------+--------+--+--------+--+---------+ Left Carotid Findings: +----------+--------+--------+--------+------------+--------+           PSV cm/sEDV cm/sStenosisDescribe    Comments +----------+--------+--------+--------+------------+--------+ CCA Prox  98      10              heterogenous         +----------+--------+--------+--------+------------+--------+ CCA Distal49      9               heterogenous         +----------+--------+--------+--------+------------+--------+ ICA Prox  69      27      1-39%   heterogenous         +----------+--------+--------+--------+------------+--------+ ICA Distal62      30                                   +----------+--------+--------+--------+------------+--------+ ECA       93      9                                     +----------+--------+--------+--------+------------+--------+ +----------+--------+--------+--------+------------+ SubclavianPSV cm/sEDV cm/sDescribeArm Pressure +----------+--------+--------+--------+------------+           102                                  +----------+--------+--------+--------+------------+ +---------+--------+--+--------+--+---------+ VertebralPSV cm/s44EDV cm/s15Antegrade +---------+--------+--+--------+--+---------+  ABI Findings: +--------+------------------+-----+---------+--------+ Right  Rt Pressure (mmHg)IndexWaveform Comment  +--------+------------------+-----+---------+--------+ [id number]                    triphasic         +--------+------------------+-----+---------+--------+ PTA     163               1.38 triphasic         +--------+------------------+-----+---------+--------+ DP      131               1.11 triphasic         +--------+------------------+-----+---------+--------+ +--------+------------------+-----+---------+-------+ Left    Lt Pressure (mmHg)IndexWaveform Comment +--------+------------------+-----+---------+-------+ [id number]                    triphasic        +--------+------------------+-----+---------+-------+ PTA     148               1.25 triphasic        +--------+------------------+-----+---------+-------+ DP      145               1.23 triphasic        +--------+------------------+-----+---------+-------+ +-------+---------------+----------------+ ABI/TBIToday's ABI/TBIPrevious ABI/TBI +-------+---------------+----------------+ Right  1.38                            +-------+---------------+----------------+ Left   1.25                            +-------+---------------+----------------+  Right Doppler Findings: +--------+--------+-----+---------+--------+ Site    PressureIndexDoppler  Comments +--------+--------+-----+---------+--------+ [id number]           triphasic         +--------+--------+-----+---------+--------+ Radial               triphasic         +--------+--------+-----+---------+--------+ Ulnar                triphasic         +--------+--------+-----+---------+--------+  Left Doppler Findings: +--------+--------+-----+---------+--------+ Site    PressureIndexDoppler  Comments +--------+--------+-----+---------+--------+ [id number]          triphasic         +--------+--------+-----+---------+--------+ Radial               triphasic         +--------+--------+-----+---------+--------+ Ulnar                triphasic         +--------+--------+-----+---------+--------+  Summary: Right Carotid: Velocities in the right ICA are consistent with a 1-39% stenosis. Left Carotid: Velocities in the left ICA are consistent with a 1-39% stenosis. Vertebrals: Bilateral vertebral arteries demonstrate antegrade flow. Right ABI: Resting right ankle-brachial index indicates noncompressible right lower extremity arteries. Left ABI: Resting left ankle-brachial index is within normal range. No evidence of significant left lower extremity arterial disease. Right Upper Extremity: Doppler waveform obliterate with right radial compression. Doppler waveforms remain within normal limits with right ulnar compression. Left Upper Extremity: Doppler waveforms remain within normal limits with left radial compression. Doppler waveforms remain within normal limits with left ulnar compression.  Electronically signed by Harold Barban MD on 03/18/2020 at 5:18:24 PM.    Final         Scheduled Meds: . aspirin  81 mg Oral Daily  . atorvastatin  80 mg Oral Daily  . insulin aspart  0-5 Units Subcutaneous QHS  . insulin aspart  0-9 Units Subcutaneous TID WC  . losartan  50 mg Oral Daily  . metoprolol succinate  25 mg Oral Daily  . pantoprazole (PROTONIX) IV  40 mg Intravenous Q24H  . sodium chloride flush  3 mL Intravenous Once  . sodium  chloride flush  3 mL Intravenous Q12H   Continuous Infusions: . sodium chloride    . heparin 650 Units/hr (03/19/20 1115)     LOS: 1 day   Time spent 30 minutes  Darliss Cheney, MD [facility] P7/06/2020, 1:05 PM

## 2020-03-20 DIAGNOSIS — E1159 Type 2 diabetes mellitus with other circulatory complications: Secondary | ICD-10-CM

## 2020-03-20 LAB — CBC
HCT: 27.8 % — ABNORMAL LOW (ref 39.0–52.0)
Hemoglobin: 8.9 g/dL — ABNORMAL LOW (ref 13.0–17.0)
MCH: 26.8 pg (ref 26.0–34.0)
MCHC: 32 g/dL (ref 30.0–36.0)
MCV: 83.7 fL (ref 80.0–100.0)
Platelets: 342 10*3/uL (ref 150–400)
RBC: 3.32 MIL/uL — ABNORMAL LOW (ref 4.22–5.81)
RDW: 14.3 % (ref 11.5–15.5)
WBC: 6.8 10*3/uL (ref 4.0–10.5)
nRBC: 0 % (ref 0.0–0.2)

## 2020-03-20 LAB — GLUCOSE, CAPILLARY
Glucose-Capillary: 142 mg/dL — ABNORMAL HIGH (ref 70–99)
Glucose-Capillary: 196 mg/dL — ABNORMAL HIGH (ref 70–99)
Glucose-Capillary: 134 mg/dL — ABNORMAL HIGH (ref 70–99)
Glucose-Capillary: 228 mg/dL — ABNORMAL HIGH (ref 70–99)

## 2020-03-20 LAB — HEPARIN LEVEL (UNFRACTIONATED): Heparin Unfractionated: 0.54 IU/mL (ref 0.30–0.70)

## 2020-03-20 LAB — CEA: CEA: 3.1 ng/mL (ref 0.0–4.7)

## 2020-03-20 MED ORDER — PANTOPRAZOLE SODIUM 40 MG PO TBEC
40.0000 mg | DELAYED_RELEASE_TABLET | Freq: Every day | ORAL | Status: DC
  Administered 2020-03-20 – 2020-03-23 (×4): 40 mg via ORAL
  Filled 2020-03-20 (×4): qty 1

## 2020-03-20 NOTE — Progress Notes (Signed)
ANTICOAGULATION CONSULT NOTE  Pharmacy Consult for heparin Indication:  CAD awaiting CABG  No Known Allergies  Patient Measurements: Height: 5\' 8"  (172.7 cm) Weight: 65.9 kg (145 lb 4.5 oz) IBW/kg (Calculated) : 68.4 Vital Signs: Temp: 97.9 F (36.6 C) (07/11 0807) Temp Source: Oral (07/11 0807) BP: 109/68 (07/11 0807) Pulse Rate: 88 (07/11 0432)  Labs: Recent Labs    03/18/20 1017 03/18/20 1017 03/18/20 2015 03/19/20 0224 03/19/20 0224 03/19/20 1035 03/19/20 1957 03/20/20 0146  HGB 7.7*   < > 8.8* 9.9*  --   --   --  8.9*  HCT 24.6*   < > 27.9* 31.5*  --   --   --  27.8*  PLT 351   < > 325 378  --   --   --  342  LABPROT  --   --   --  13.8  --   --   --   --   INR  --   --   --  1.1  --   --   --   --   HEPARINUNFRC 0.30   < >  --  0.92*   < > 0.68 0.46 0.54  CREATININE 1.17  --   --  1.21  --   --   --   --    < > = values in this interval not displayed.    Estimated Creatinine Clearance: 55.2 mL/min (by C-G formula based on SCr of 1.21 mg/dL).  Assessment: 89 yoM s/p cath with 3vCAD. Surgery consult pending, IV heparin resumed.  Heparin level remains therapeutic at 0.54 on 650 units/hr, Hgb back down this AM. No bleeding noted.   Goal of Therapy:  Heparin level 0.3-0.7 units/ml Monitor platelets by anticoagulation protocol: Yes   Plan:  -Continue IV heparin at 650 units/hr. -Daily heparin level and CBC.  Claudina Lick, PharmD PGY1 Acute Care Pharmacy Resident 03/20/2020 10:48 AM  Please check [email or web address] for unit-specific pharmacy phone numbers.

## 2020-03-20 NOTE — Progress Notes (Signed)
PROGRESS NOTE    Hiep Ollis  PYK:[id number] DOB: October 03, 1950 DOA: 03/16/2020 PCP: [name], MD   Brief Narrative: Mubarak Bevens is a 69 y.o. [name] speaking  male with medical history significant of coronary artery disease disease status post stenting, diabetes type 2, hypertension, hyperlipidemia, normocytic anemia who presents to the emergency department at [facility] with complaints of chest pain.  He was sent from [facility] to [facility] for further evaluation due to typical symptoms.  Troponins were negative.  High suspicion for unstable angina.    Also noted to have significant normocytic anemia with positive FOBT.  He underwent cardiac cath with finding of severe triple-vessel disease.  Cardiothoracic surgery consulted for possible CABG and he is scheduled on 03/24/2020 as he has to be off of Plavix for 5 days.  Assessment & Plan:   Principal Problem:   Nonspecific chest pain Active Problems:   CAD (coronary artery disease)   DM2 (diabetes mellitus, type 2) (HCC)   HTN (hypertension)   HLD (hyperlipidemia)   Normocytic anemia   AKI (acute kidney injury) ([place])   Chest pain    Unstable angina/typical chest pain/CAD: His pain was associated with exertion and relieved with nitroglycerin. History of stent placement about 11 years  ago in [place].  Follows with cardiology at [facility].  Takes Plavix, metoprolol, Lipitor, losartan .  Cardiac cath done here which showed severe diffuse three-vessel coronary disease with decreased left ventricular function with ejection fraction of 40%. Cardiothoracic surgery consulted.  They plan to do CABG on 03/24/2020.  Plavix was discontinued 03/18/2020.  Aspirin added.  Echo shows decreased ejection fraction with hypokinesis/akinesis of distal inferior, mid/distal septal, distal lateral and apical walls.  EF 35%.  He remains on heparin and is symptom-free now.  AKI: His baseline creatinine ranges from 1.1-1.2.  Elevated creatinine on  presentation.  Now resolved.  Normocytic anemia: Baseline hemoglobin in the range of 7.  No history of melena or hematochezia.  He takes Plavix.  Positive stool occult blood.  Low iron as per iron studies.  High suspicion for GI bleed.  He has an appointment with gastroenterology at [facility].  GI consulted here and recommended to follow-up with his gastroenterologist as an outpatient for further work-up, no EGD/Colo planned here.  Recommended to continue Protonix. S/P a dose of IV iron.  Hemoglobin dropped to 7.7 on 03/18/2020 and he received 1 unit of peritransplant.  Posttransfusion hemoglobin today is 9.9.  Essential hypertension: Currently blood pressure stable.  Continue current medications.  Monitor blood pressure  Hyperlipidemia: On Lipitor  Right lung upper lobe opacity: CT chest without contrast shows focal groundglass opacity in posterior right upper lobe.  This could be inflammatory or infectious and low-grade adenocarcinoma cannot definitely be excluded.  He has no leukocytosis or any respiratory symptoms and he is afebrile.  I doubt pneumonia.  Follow-up CT in 6 months is recommended.  Diabetes type 2: On glipizide and Metformin at home.  Continue sliding-scale insulin here.  Monitor CBGs.      DVT prophylaxis: Heparin GTT Code Status: Full Family Communication: daughter and wife at bed side Status is: Observation  Status is: Inpatient  Remains inpatient appropriate because:Ongoing diagnostic testing needed not appropriate for outpatient work up   Dispo: The patient is from: Home              Anticipated d/c is to: Home  Anticipated d/c date is: > 3 days              Patient currently is not medically stable to d/c.   Consultants: Cardiology, gastroenterology, cardiothoracic surgery  Procedures:None  Antimicrobials:  Anti-infectives (From admission, onward)   None      Subjective: Patient seen and examined.  No family member present at bedside.   Patient's primary RN also [place] speaking and help with interpretation.  Patient has no complaints.  Objective: Vitals:   03/19/20 2000 03/20/20 0052 03/20/20 0432 03/20/20 0807  BP:  (!) 95/49 113/66 109/68  Pulse: 63 87 88   Resp: 20 19 16    Temp:  98.2 F (36.8 C) 97.9 F (36.6 C) 97.9 F (36.6 C)  TempSrc:  Oral Oral Oral  SpO2: 99% 99% 100%   Weight:      Height:        Intake/Output Summary (Last 24 hours) at 03/20/2020 1112 Last data filed at 03/20/2020 0630 Gross per 24 hour  Intake 422.64 ml  Output 480 ml  Net -57.36 ml   Filed Weights   03/15/20 2310 03/16/20 1628  Weight: 68 kg 65.9 kg    Examination:  General exam: Appears calm and comfortable  Respiratory system: Clear to auscultation. Respiratory effort normal. Cardiovascular system: S1 & S2 heard, RRR. No JVD, murmurs, rubs, gallops or clicks. No pedal edema. Gastrointestinal system: Abdomen is nondistended, soft and nontender. No organomegaly or masses felt. Normal bowel sounds heard. Central nervous system: Alert and oriented. No focal neurological deficits. Extremities: Symmetric 5 x 5 power. Skin: No rashes, lesions or ulcers.  Psychiatry: Judgement and insight appear normal. Mood & affect appropriate.    Data Reviewed: I have personally reviewed following labs and imaging studies  CBC: Recent Labs  Lab 03/17/20 0232 03/18/20 1017 03/18/20 2015 03/19/20 0224 03/20/20 0146  WBC 8.0 5.3 5.2 6.7 6.8  NEUTROABS 4.2 3.6  --   --   --   HGB 8.5* 7.7* 8.8* 9.9* 8.9*  HCT 28.1* 24.6* 27.9* 31.5* 27.8*  MCV 83.1 83.1 83.3 84.2 83.7  PLT 403* 351 325 378 299   Basic Metabolic Panel: Recent Labs  Lab 03/15/20 2312 03/17/20 0232 03/18/20 1017 03/19/20 0224  NA 138 137 137 137  K 4.3 4.3 3.9 4.0  CL 103 105 109 106  CO2 23 23 22  21*  GLUCOSE 164* 106* 222* 166*  BUN 36* 24* 16 19  CREATININE 1.40* 1.18 1.17 1.21  CALCIUM 9.0 8.9 8.0* 8.6*   GFR: Estimated Creatinine Clearance: 55.2  mL/min (by C-G formula based on SCr of 1.21 mg/dL). Liver Function Tests: Recent Labs  Lab 03/19/20 0224  AST 20  ALT 20  ALKPHOS 61  BILITOT 1.1  PROT 6.0*  ALBUMIN 3.1*   No results for input(s): LIPASE, AMYLASE in the last 168 hours. No results for input(s): AMMONIA in the last 168 hours. Coagulation Profile: Recent Labs  Lab 03/19/20 0224  INR 1.1   Cardiac Enzymes: No results for input(s): CKTOTAL, CKMB, CKMBINDEX, TROPONINI in the last 168 hours. BNP (last 3 results) No results for input(s): PROBNP in the last 8760 hours. HbA1C: No results for input(s): HGBA1C in the last 72 hours. CBG: Recent Labs  Lab 03/19/20 0609 03/19/20 1131 03/19/20 1643 03/19/20 2121 03/20/20 0608  GLUCAP 139* 186* 250* 265* 142*   Lipid Profile: No results for input(s): CHOL, HDL, LDLCALC, TRIG, CHOLHDL, LDLDIRECT in the last 72 hours. Thyroid Function Tests: Recent Labs  03/19/20 0224  TSH 1.167   Anemia Panel: No results for input(s): VITAMINB12, FOLATE, FERRITIN, TIBC, IRON, RETICCTPCT in the last 72 hours. Sepsis Labs: No results for input(s): PROCALCITON, LATICACIDVEN in the last 168 hours.  Recent Results (from the past 240 hour(s))  SARS Coronavirus 2 by RT PCR (hospital order, performed in [facility] hospital lab) Nasopharyngeal Nasopharyngeal Swab     Status: None   Collection Time: 03/16/20  2:40 AM   Specimen: Nasopharyngeal Swab  Result Value Ref Range Status   SARS Coronavirus 2 NEGATIVE NEGATIVE Final    Comment: (NOTE) SARS-CoV-2 target nucleic acids are NOT DETECTED.  The SARS-CoV-2 RNA is generally detectable in upper and lower respiratory specimens during the acute phase of infection. The lowest concentration of SARS-CoV-2 viral copies this assay can detect is 250 copies / mL. A negative result does not preclude SARS-CoV-2 infection and should not be used as the sole basis for treatment or other patient management decisions.  A negative result may  occur with improper specimen collection / handling, submission of specimen other than nasopharyngeal swab, presence of viral mutation(s) within the areas targeted by this assay, and inadequate number of viral copies (<250 copies / mL). A negative result must be combined with clinical observations, patient history, and epidemiological information.  Fact Sheet for Patients:   StrictlyIdeas.no  Fact Sheet for Healthcare Providers: BankingDealers.co.za  This test is not yet approved or  cleared by the [place] FDA and has been authorized for detection and/or diagnosis of SARS-CoV-2 by FDA under an Emergency Use Authorization (EUA).  This EUA will remain in effect (meaning this test can be used) for the duration of the COVID-19 declaration under Section 564(b)(1) of the Act, 21 U.S.C. section 360bbb-3(b)(1), unless the authorization is terminated or revoked sooner.  Performed at [facility], [address] [place], [place] [address]   MRSA PCR Screening     Status: None   Collection Time: 03/16/20  5:00 PM   Specimen: Nasal Mucosa; Nasopharyngeal  Result Value Ref Range Status   MRSA by PCR NEGATIVE NEGATIVE Final    Comment:        The GeneXpert MRSA Assay (FDA approved for NASAL specimens only), is one component of a comprehensive MRSA colonization surveillance program. It is not intended to diagnose MRSA infection nor to guide or monitor treatment for MRSA infections. Performed at [facility], [place] [address] [place], [place] [address]   Surgical pcr screen     Status: None   Collection Time: 03/18/20  4:27 PM   Specimen: Nasal Mucosa; Nasal Swab  Result Value Ref Range Status   MRSA, PCR NEGATIVE NEGATIVE Final   Staphylococcus aureus NEGATIVE NEGATIVE Final    Comment: (NOTE) The Xpert SA Assay (FDA approved for NASAL specimens in patients 22 years of age and older), is one component of a  comprehensive surveillance program. It is not intended to diagnose infection nor to guide or monitor treatment. Performed at [facility], [place] [address] [place], [place] [address]          Radiology Studies: CT ABDOMEN PELVIS WO CONTRAST  Result Date: 03/19/2020 CLINICAL DATA:  Anemia due to chronic blood loss. Ischemic cardiomyopathy. EXAM: CT CHEST, ABDOMEN AND PELVIS WITHOUT CONTRAST TECHNIQUE: Multidetector CT imaging of the chest, abdomen and pelvis was performed following the standard protocol without IV contrast. COMPARISON:  None. FINDINGS: CT CHEST FINDINGS Cardiovascular: No acute findings. Aortic and coronary artery atherosclerosis  noted. Mediastinum/Lymph Nodes: No masses or pathologically enlarged lymph nodes identified on this unenhanced exam. Lungs/Pleura: Focal area of patchy ground-glass opacity is seen in the posterior right upper lobe on images 58 in 59/series 5. No other areas of pulmonary infiltrate, nodules, or masses identified. No evidence of pleural effusion. Musculoskeletal:  No suspicious bone lesions identified. CT ABDOMEN AND PELVIS FINDINGS Hepatobiliary: No masses visualized on this unenhanced exam. Gallbladder is unremarkable. No evidence of biliary ductal dilatation. Pancreas: No mass or inflammatory changes identified on this unenhanced exam. Spleen:  Within normal limits in size. Adrenals/Urinary Tract: A few tiny 1-2 mm renal calculi are noted, however there is no evidence of ureteral calculi or hydronephrosis. Unremarkable appearance of bladder. Stomach/Bowel: No evidence of obstruction, inflammatory process, or abnormal fluid collections. Normal appendix visualized. Vascular/Lymphatic: No pathologically enlarged lymph nodes identified. No abdominal aortic aneurysm. Aortic atherosclerosis noted. Reproductive:  Mildly enlarged prostate. Other:  None. Musculoskeletal:  No suspicious bone lesions identified. IMPRESSION: Focal ground-glass opacity in posterior  right upper lobe, likely infectious or inflammatory in etiology although low-grade adenocarcinoma cannot definitely be excluded. Recommend continued follow-up by chest CT in 6 months. This recommendation follows the consensus statement: Guidelines for Management of Small Pulmonary Nodules Detected on CT Images: From the Fleischner Society 2017; Radiology 2017; 284:228-243. No acute findings within the abdomen or pelvis. Bilateral nephrolithiasis. No evidence of ureteral calculi or hydronephrosis. Mildly enlarged prostate. Coronary artery atherosclerosis. Electronically Signed   By: Marlaine Hind M.D.   On: 03/19/2020 11:57   CT chest without contrast  Result Date: 03/19/2020 CLINICAL DATA:  Anemia due to chronic blood loss. Ischemic cardiomyopathy. EXAM: CT CHEST, ABDOMEN AND PELVIS WITHOUT CONTRAST TECHNIQUE: Multidetector CT imaging of the chest, abdomen and pelvis was performed following the standard protocol without IV contrast. COMPARISON:  None. FINDINGS: CT CHEST FINDINGS Cardiovascular: No acute findings. Aortic and coronary artery atherosclerosis noted. Mediastinum/Lymph Nodes: No masses or pathologically enlarged lymph nodes identified on this unenhanced exam. Lungs/Pleura: Focal area of patchy ground-glass opacity is seen in the posterior right upper lobe on images 58 in 59/series 5. No other areas of pulmonary infiltrate, nodules, or masses identified. No evidence of pleural effusion. Musculoskeletal:  No suspicious bone lesions identified. CT ABDOMEN AND PELVIS FINDINGS Hepatobiliary: No masses visualized on this unenhanced exam. Gallbladder is unremarkable. No evidence of biliary ductal dilatation. Pancreas: No mass or inflammatory changes identified on this unenhanced exam. Spleen:  Within normal limits in size. Adrenals/Urinary Tract: A few tiny 1-2 mm renal calculi are noted, however there is no evidence of ureteral calculi or hydronephrosis. Unremarkable appearance of bladder. Stomach/Bowel: No  evidence of obstruction, inflammatory process, or abnormal fluid collections. Normal appendix visualized. Vascular/Lymphatic: No pathologically enlarged lymph nodes identified. No abdominal aortic aneurysm. Aortic atherosclerosis noted. Reproductive:  Mildly enlarged prostate. Other:  None. Musculoskeletal:  No suspicious bone lesions identified. IMPRESSION: Focal ground-glass opacity in posterior right upper lobe, likely infectious or inflammatory in etiology although low-grade adenocarcinoma cannot definitely be excluded. Recommend continued follow-up by chest CT in 6 months. This recommendation follows the consensus statement: Guidelines for Management of Small Pulmonary Nodules Detected on CT Images: From the Fleischner Society 2017; Radiology 2017; 284:228-243. No acute findings within the abdomen or pelvis. Bilateral nephrolithiasis. No evidence of ureteral calculi or hydronephrosis. Mildly enlarged prostate. Coronary artery atherosclerosis. Electronically Signed   By: Marlaine Hind M.D.   On: 03/19/2020 11:57   VAS US DOPPLER PRE CABG  Result Date: 03/18/2020 PREOPERATIVE VASCULAR EVALUATION  Indications:  Pre-CABG. Risk Factors:     Hypertension, hyperlipidemia, Diabetes. Comparison Study: no prior Performing Technologist: Abram Sander RVS  Examination Guidelines: A complete evaluation includes B-mode imaging, spectral Doppler, color Doppler, and power Doppler as needed of all accessible portions of each vessel. Bilateral testing is considered an integral part of a complete examination. Limited examinations for reoccurring indications may be performed as noted.  Right Carotid Findings: +----------+--------+--------+--------+------------+--------+           PSV cm/sEDV cm/sStenosisDescribe    Comments +----------+--------+--------+--------+------------+--------+ CCA Prox  96      14              heterogenous         +----------+--------+--------+--------+------------+--------+ CCA  Distal52      9               heterogenous         +----------+--------+--------+--------+------------+--------+ ICA Prox  62      17      1-39%   heterogenous         +----------+--------+--------+--------+------------+--------+ ICA Distal66      23                                   +----------+--------+--------+--------+------------+--------+ ECA       109     7                                    +----------+--------+--------+--------+------------+--------+ Portions of this table do not appear on this page. +----------+--------+-------+--------+------------+           PSV cm/sEDV cmsDescribeArm Pressure +----------+--------+-------+--------+------------+ Subclavian93                                  +----------+--------+-------+--------+------------+ +---------+--------+--+--------+--+---------+ VertebralPSV cm/s50EDV cm/s15Antegrade +---------+--------+--+--------+--+---------+ Left Carotid Findings: +----------+--------+--------+--------+------------+--------+           PSV cm/sEDV cm/sStenosisDescribe    Comments +----------+--------+--------+--------+------------+--------+ CCA Prox  98      10              heterogenous         +----------+--------+--------+--------+------------+--------+ CCA Distal49      9               heterogenous         +----------+--------+--------+--------+------------+--------+ ICA Prox  69      27      1-39%   heterogenous         +----------+--------+--------+--------+------------+--------+ ICA Distal62      30                                   +----------+--------+--------+--------+------------+--------+ ECA       93      9                                    +----------+--------+--------+--------+------------+--------+ +----------+--------+--------+--------+------------+ SubclavianPSV cm/sEDV cm/sDescribeArm Pressure +----------+--------+--------+--------+------------+           102                                   +----------+--------+--------+--------+------------+ +---------+--------+--+--------+--+---------+  VertebralPSV cm/s44EDV cm/s15Antegrade +---------+--------+--+--------+--+---------+  ABI Findings: +--------+------------------+-----+---------+--------+ Right   Rt Pressure (mmHg)IndexWaveform Comment  +--------+------------------+-----+---------+--------+ [id number]                    triphasic         +--------+------------------+-----+---------+--------+ PTA     163               1.38 triphasic         +--------+------------------+-----+---------+--------+ DP      131               1.11 triphasic         +--------+------------------+-----+---------+--------+ +--------+------------------+-----+---------+-------+ Left    Lt Pressure (mmHg)IndexWaveform Comment +--------+------------------+-----+---------+-------+ [id number]                    triphasic        +--------+------------------+-----+---------+-------+ PTA     148               1.25 triphasic        +--------+------------------+-----+---------+-------+ DP      145               1.23 triphasic        +--------+------------------+-----+---------+-------+ +-------+---------------+----------------+ ABI/TBIToday's ABI/TBIPrevious ABI/TBI +-------+---------------+----------------+ Right  1.38                            +-------+---------------+----------------+ Left   1.25                            +-------+---------------+----------------+  Right Doppler Findings: +--------+--------+-----+---------+--------+ Site    PressureIndexDoppler  Comments +--------+--------+-----+---------+--------+ [id number]          triphasic         +--------+--------+-----+---------+--------+ Radial               triphasic         +--------+--------+-----+---------+--------+ Ulnar                triphasic         +--------+--------+-----+---------+--------+   Left Doppler Findings: +--------+--------+-----+---------+--------+ Site    PressureIndexDoppler  Comments +--------+--------+-----+---------+--------+ [id number]          triphasic         +--------+--------+-----+---------+--------+ Radial               triphasic         +--------+--------+-----+---------+--------+ Ulnar                triphasic         +--------+--------+-----+---------+--------+  Summary: Right Carotid: Velocities in the right ICA are consistent with a 1-39% stenosis. Left Carotid: Velocities in the left ICA are consistent with a 1-39% stenosis. Vertebrals: Bilateral vertebral arteries demonstrate antegrade flow. Right ABI: Resting right ankle-brachial index indicates noncompressible right lower extremity arteries. Left ABI: Resting left ankle-brachial index is within normal range. No evidence of significant left lower extremity arterial disease. Right Upper Extremity: Doppler waveform obliterate with right radial compression. Doppler waveforms remain within normal limits with right ulnar compression. Left Upper Extremity: Doppler waveforms remain within normal limits with left radial compression. Doppler waveforms remain within normal limits with left ulnar compression.  Electronically signed by Harold Barban MD on 03/18/2020 at 5:18:24 PM.    Final         Scheduled Meds: . aspirin  81 mg Oral Daily  . atorvastatin  80 mg Oral Daily  . insulin  aspart  0-5 Units Subcutaneous QHS  . insulin aspart  0-9 Units Subcutaneous TID WC  . losartan  50 mg Oral Daily  . metoprolol succinate  25 mg Oral Daily  . pantoprazole  40 mg Oral Daily  . sodium chloride flush  3 mL Intravenous Once  . sodium chloride flush  3 mL Intravenous Q12H   Continuous Infusions: . sodium chloride    . heparin 650 Units/hr (03/20/20 0630)     LOS: 2 days   Time spent 26 minutes  Darliss Cheney, MD [facility] P7/07/2020, 11:12 AM

## 2020-03-20 NOTE — Progress Notes (Signed)
Progress Note  Patient Name: Jimmy Blankenship Date of Encounter: 03/20/2020  [facility] Cardiologist: Nahser   Subjective   69 year old gentleman with a history of coronary artery disease-status post stenting in the past. He presented with symptoms of unstable angina. Heart catheterization yesterday reveals three-vessel coronary artery disease with moderate LV dysfunction.  He has anteroapical dyskinesis. He has been referred to CV surgery- seen by Dr [name].  [name] [id number] served as our [name] interpreter ( via [employer] services)  No cp. 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  We discussed the fact that he is on iv heparin and I would not recommend going home before surgery   Inpatient Medications    Scheduled Meds: . aspirin  81 mg Oral Daily  . atorvastatin  80 mg Oral Daily  . insulin aspart  0-5 Units Subcutaneous QHS  . insulin aspart  0-9 Units Subcutaneous TID WC  . losartan  50 mg Oral Daily  . metoprolol succinate  25 mg Oral Daily  . pantoprazole (PROTONIX) IV  40 mg Intravenous Q24H  . sodium chloride flush  3 mL Intravenous Once  . sodium chloride flush  3 mL Intravenous Q12H   Continuous Infusions: . sodium chloride    . heparin 650 Units/hr (03/20/20 0630)   PRN Meds: sodium chloride, acetaminophen, magnesium hydroxide, nitroGLYCERIN, ondansetron (ZOFRAN) IV, oxyCODONE, sodium chloride flush   Vital Signs    Vitals:   03/19/20 2000 03/20/20 0052 03/20/20 0432 03/20/20 0807  BP:  (!) 95/49 113/66 109/68  Pulse: 63 87 88   Resp: 20 19 16    Temp:  98.2 F (36.8 C) 97.9 F (36.6 C) 97.9 F (36.6 C)  TempSrc:  Oral Oral Oral  SpO2: 99% 99% 100%   Weight:      Height:        Intake/Output Summary (Last 24 hours) at 03/20/2020 0847 Last data filed at 03/20/2020 0630 Gross per 24 hour  Intake 425.64 ml  Output 480 ml  Net -54.36 ml   Last 3 Weights 03/16/2020 03/15/2020  Weight  (lbs) 145 lb 4.5 oz 150 lb  Weight (kg) 65.9 kg 68.04 kg      Telemetry    SR  - Personally Reviewed  ECG     no new tracing - Personally Reviewed  Physical Exam   GEN: No acute distress.   Neck: No JVD Cardiac: RRR, no murmurs, rubs, or gallops.  Respiratory: Clear to auscultation bilaterally. GI: Soft, nontender, non-distended  MS:  no edema.  R radial cath site looks good  Neuro:  Nonfocal  Psych: Normal affect   Labs    High Sensitivity Troponin:   Recent Labs  Lab 03/15/20 2312 03/16/20 0117 03/16/20 0356  TROPONINIHS 13 12 13       Chemistry Recent Labs  Lab 03/17/20 0232 03/18/20 1017 03/19/20 0224  NA 137 137 137  K 4.3 3.9 4.0  CL 105 109 106  CO2 23 22 21*  GLUCOSE 106* 222* 166*  BUN 24* 16 19  CREATININE 1.18 1.17 1.21  CALCIUM 8.9 8.0* 8.6*  PROT  --   --  6.0*  ALBUMIN  --   --  3.1*  AST  --   --  20  ALT  --   --  20  ALKPHOS  --   --  61  BILITOT  --   --  1.1  GFRNONAA >60 >  60 >60  GFRAA >60 >60 >60  ANIONGAP 9 6 10      Hematology Recent Labs  Lab 03/18/20 2015 03/19/20 0224 03/20/20 0146  WBC 5.2 6.7 6.8  RBC 3.35* 3.74* 3.32*  HGB 8.8* 9.9* 8.9*  HCT 27.9* 31.5* 27.8*  MCV 83.3 84.2 83.7  MCH 26.3 26.5 26.8  MCHC 31.5 31.4 32.0  RDW 14.0 13.9 14.3  PLT 325 378 342    BNPNo results for input(s): BNP, PROBNP in the last 168 hours.   DDimer No results for input(s): DDIMER in the last 168 hours.   Radiology      Cardiac Studies      Patient Profile   69 y.o. male with known CAD, has had progression of his cad and now has been referred to surgery   Assessment & Plan    1. Coronary artery disease.  The patient has severe three-vessel coronary artery disease.  He has anteroapical akinesis.  I agree with Dr. Tamala Julian that his best option is for bypass surgery.  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   2. He was seen by Dr Dr [name] - Patient will be pared for multivessel coronary bypass grafting with left IMA plan to the LAD and vein grafts to the PDA and OM.  Surgery will take place next week after Plavix washout, probably July 15.  - continue iv Heparin - P2Y12 was 237 yesterday - continue ASA, atorvastatin, metoprolol and losartan, vitals are stable - preop CT chest showed Focal ground-glass opacity in posterior right upper lobe, likely infectious or inflammatory in etiology although low-grade adenocarcinoma cannot definitely be excluded. Recommend continued follow-up by chest CT in 6 months (he is a former smoker, quit smoking in 2006)  Ischemic cardiomyopathy - he is euvolemic  Hyperlipidemia: Labs from the [place] lab from March 04, 2020 reveal total cholesterol of 126.  The triglycerides are 71.  The HDL is 38.  LDL 73.  Continue atorvastatin 40 mg a day.  Hypertension: soft BP, but he is asymptomatic, Continue current medications.  Hyperlipidemia: Continue current medications.  For questions or updates, please contact [place] Please consult [email or web address] for contact info under   Signed, Ena Dawley, MD  03/20/2020, 8:47 AM

## 2020-03-21 ENCOUNTER — Inpatient Hospital Stay (HOSPITAL_COMMUNITY): Payer: Medicare Other

## 2020-03-21 LAB — GLUCOSE, CAPILLARY
Glucose-Capillary: 152 mg/dL — ABNORMAL HIGH (ref 70–99)
Glucose-Capillary: 226 mg/dL — ABNORMAL HIGH (ref 70–99)
Glucose-Capillary: 175 mg/dL — ABNORMAL HIGH (ref 70–99)
Glucose-Capillary: 158 mg/dL — ABNORMAL HIGH (ref 70–99)

## 2020-03-21 LAB — CBC
HCT: 27.9 % — ABNORMAL LOW (ref 39.0–52.0)
Hemoglobin: 8.8 g/dL — ABNORMAL LOW (ref 13.0–17.0)
MCH: 26.8 pg (ref 26.0–34.0)
MCHC: 31.5 g/dL (ref 30.0–36.0)
MCV: 85.1 fL (ref 80.0–100.0)
Platelets: 343 10*3/uL (ref 150–400)
RBC: 3.28 MIL/uL — ABNORMAL LOW (ref 4.22–5.81)
RDW: 14.7 % (ref 11.5–15.5)
WBC: 8.5 10*3/uL (ref 4.0–10.5)
nRBC: 0.2 % (ref 0.0–0.2)

## 2020-03-21 LAB — HEPARIN LEVEL (UNFRACTIONATED): Heparin Unfractionated: 0.41 IU/mL (ref 0.30–0.70)

## 2020-03-21 LAB — PULMONARY FUNCTION TEST
FEF 25-75 Pre: 2.03 L/sec
FEF2575-%Pred-Pre: 83 %
FEV1-%Pred-Pre: 95 %
FEV1-Pre: 2.97 L
FEV1FVC-%Pred-Pre: 96 %
FEV6-%Pred-Pre: 98 %
FEV6-Pre: 3.93 L
FEV6FVC-%Pred-Pre: 100 %
FVC-%Pred-Pre: 98 %
FVC-Pre: 4.16 L
Pre FEV1/FVC ratio: 71 %
Pre FEV6/FVC Ratio: 95 %

## 2020-03-21 LAB — HEMOGLOBIN A1C
Hgb A1c MFr Bld: 6.4 % — ABNORMAL HIGH (ref 4.8–5.6)
Mean Plasma Glucose: 136.98 mg/dL

## 2020-03-21 MED ORDER — METOPROLOL SUCCINATE ER 50 MG PO TB24
50.0000 mg | ORAL_TABLET | Freq: Every day | ORAL | Status: DC
  Administered 2020-03-22 – 2020-03-23 (×2): 50 mg via ORAL
  Filled 2020-03-21 (×2): qty 1

## 2020-03-21 MED ORDER — INSULIN GLARGINE 100 UNIT/ML ~~LOC~~ SOLN
10.0000 [IU] | Freq: Every day | SUBCUTANEOUS | Status: DC
  Administered 2020-03-21 – 2020-03-22 (×2): 10 [IU] via SUBCUTANEOUS
  Filled 2020-03-21 (×3): qty 0.1

## 2020-03-21 NOTE — Plan of Care (Signed)

## 2020-03-21 NOTE — Progress Notes (Signed)
4 Days Post-Op Procedure(s) (LRB): LEFT HEART CATH AND CORONARY ANGIOGRAPHY (N/A) Subjective: Stable on iv heparin Increase HR with exertion CT chest images reviewed- Aorta ok for CABG CABGx3 first availability 7-15  Objective: Vital signs in last 24 hours: Temp:  [97.6 F (36.4 C)-97.8 F (36.6 C)] 97.7 F (36.5 C) (07/12 0722) Pulse Rate:  [81-95] 95 (07/12 0416) Cardiac Rhythm: Normal sinus rhythm (07/12 0725) Resp:  [17-20] 19 (07/12 0416) BP: (96-110)/(65-72) 97/70 (07/12 0722) SpO2:  [97 %-99 %] 98 % (07/12 0416)  Hemodynamic parameters for last 24 hours:  nsr  Intake/Output from previous day: 07/11 0701 - 07/12 0700 In: 679.8 [P.O.:540; I.V.:139.8] Out: 750 [Urine:750] Intake/Output this shift: Total I/O In: 240 [P.O.:240] Out: -        Exam    General- alert and comfortable    Neck- no JVD, no cervical adenopathy palpable, no carotid bruit   Lungs- clear without rales, wheezes   Cor- regular rate and rhythm, no murmur , gallop   Abdomen- soft, non-tender   Extremities - warm, non-tender, minimal edema   Neuro- oriented, appropriate, no focal weakness   Lab Results: Recent Labs    03/20/20 0146 03/21/20 0222  WBC 6.8 8.5  HGB 8.9* 8.8*  HCT 27.8* 27.9*  PLT 342 343   BMET:  Recent Labs    03/18/20 1017 03/19/20 0224  NA 137 137  K 3.9 4.0  CL 109 106  CO2 22 21*  GLUCOSE 222* 166*  BUN 16 19  CREATININE 1.17 1.21  CALCIUM 8.0* 8.6*    PT/INR:  Recent Labs    03/19/20 0224  LABPROT 13.8  INR 1.1   ABG    Component Value Date/Time   PHART 7.428 03/19/2020 0630   HCO3 24.0 03/19/2020 0630   O2SAT 94.9 03/19/2020 0630   CBG (last 3)  Recent Labs    03/20/20 1618 03/20/20 2134 03/21/20 0610  GLUCAP 134* 228* 152*    Assessment/Plan: S/P Procedure(s) (LRB): LEFT HEART CATH AND CORONARY ANGIOGRAPHY (N/A) CABG later this week   LOS: 3 days    Tharon Aquas Trigt III 03/21/2020

## 2020-03-21 NOTE — Progress Notes (Signed)
Cardiology Progress Note  Patient ID: Jimmy Blankenship MRN: [id number] DOB: June 11, 1951 Date of Encounter: 03/21/2020  Primary Cardiologist: No primary care provider on file.  Subjective   Chief Complaint: None.   HPI: No complaints this AM. Awaiting CABG.   ROS:  All other ROS reviewed and negative. Pertinent positives noted in the HPI.     Inpatient Medications  Scheduled Meds: . aspirin  81 mg Oral Daily  . atorvastatin  80 mg Oral Daily  . insulin aspart  0-5 Units Subcutaneous QHS  . insulin aspart  0-9 Units Subcutaneous TID WC  . losartan  50 mg Oral Daily  . [START ON 03/22/2020] metoprolol succinate  50 mg Oral Daily  . pantoprazole  40 mg Oral Daily  . sodium chloride flush  3 mL Intravenous Once  . sodium chloride flush  3 mL Intravenous Q12H   Continuous Infusions: . sodium chloride    . heparin 650 Units/hr (03/21/20 0400)   PRN Meds: sodium chloride, acetaminophen, magnesium hydroxide, nitroGLYCERIN, ondansetron (ZOFRAN) IV, oxyCODONE, sodium chloride flush   Vital Signs   Vitals:   03/20/20 2000 03/21/20 0009 03/21/20 0416 03/21/20 0722  BP:  110/69 106/72 97/70  Pulse: 86 92 95   Resp: 18 19 19    Temp:  97.6 F (36.4 C) 97.6 F (36.4 C) 97.7 F (36.5 C)  TempSrc:  Oral Oral Oral  SpO2: 97% 98% 98%   Weight:      Height:        Intake/Output Summary (Last 24 hours) at 03/21/2020 1029 Last data filed at 03/21/2020 7322 Gross per 24 hour  Intake 710 ml  Output 750 ml  Net -40 ml   Last 3 Weights 03/16/2020 03/15/2020  Weight (lbs) 145 lb 4.5 oz 150 lb  Weight (kg) 65.9 kg 68.04 kg      Telemetry  Overnight telemetry shows SR to ST 100 bpm-140 bpm, which I personally reviewed.   ECG  The most recent ECG shows ST anterior and inferior infarct, which I personally reviewed.   Physical Exam   Vitals:   03/20/20 2000 03/21/20 0009 03/21/20 0416 03/21/20 0722  BP:  110/69 106/72 97/70  Pulse: 86 92 95   Resp: 18 19 19    Temp:  97.6 F (36.4 C)  97.6 F (36.4 C) 97.7 F (36.5 C)  TempSrc:  Oral Oral Oral  SpO2: 97% 98% 98%   Weight:      Height:         Intake/Output Summary (Last 24 hours) at 03/21/2020 1029 Last data filed at 03/21/2020 0254 Gross per 24 hour  Intake 710 ml  Output 750 ml  Net -40 ml    Last 3 Weights 03/16/2020 03/15/2020  Weight (lbs) 145 lb 4.5 oz 150 lb  Weight (kg) 65.9 kg 68.04 kg    Body mass index is 22.09 kg/m.  General: Well nourished, well developed, in no acute distress Head: Atraumatic, normal size  Eyes: PEERLA, EOMI  Neck: Supple, no JVD Endocrine: No thryomegaly Cardiac: Normal S1, S2; RRR; no murmurs, rubs, or gallops Lungs: Clear to auscultation bilaterally, no wheezing, rhonchi or rales  Abd: Soft, nontender, no hepatomegaly  Ext: No edema, pulses 2+ Musculoskeletal: No deformities, BUE and BLE strength normal and equal Skin: Warm and dry, no rashes   Neuro: Alert and oriented to person, place, time, and situation, CNII-XII grossly intact, no focal deficits  Psych: Normal mood and affect   Labs  High Sensitivity Troponin:   Recent  Labs  Lab 03/15/20 2312 03/16/20 0117 03/16/20 0356  TROPONINIHS 13 12 13      Cardiac EnzymesNo results for input(s): TROPONINI in the last 168 hours. No results for input(s): TROPIPOC in the last 168 hours.  Chemistry Recent Labs  Lab 03/17/20 0232 03/18/20 1017 03/19/20 0224  NA 137 137 137  K 4.3 3.9 4.0  CL 105 109 106  CO2 23 22 21*  GLUCOSE 106* 222* 166*  BUN 24* 16 19  CREATININE 1.18 1.17 1.21  CALCIUM 8.9 8.0* 8.6*  PROT  --   --  6.0*  ALBUMIN  --   --  3.1*  AST  --   --  20  ALT  --   --  20  ALKPHOS  --   --  61  BILITOT  --   --  1.1  GFRNONAA >60 >60 >60  GFRAA >60 >60 >60  ANIONGAP 9 6 10     Hematology Recent Labs  Lab 03/19/20 0224 03/20/20 0146 03/21/20 0222  WBC 6.7 6.8 8.5  RBC 3.74* 3.32* 3.28*  HGB 9.9* 8.9* 8.8*  HCT 31.5* 27.8* 27.9*  MCV 84.2 83.7 85.1  MCH 26.5 26.8 26.8  MCHC 31.4 32.0 31.5    RDW 13.9 14.3 14.7  PLT 378 342 343   BNPNo results for input(s): BNP, PROBNP in the last 168 hours.  DDimer No results for input(s): DDIMER in the last 168 hours.   Radiology  CT ABDOMEN PELVIS WO CONTRAST  Result Date: 03/19/2020 CLINICAL DATA:  Anemia due to chronic blood loss. Ischemic cardiomyopathy. EXAM: CT CHEST, ABDOMEN AND PELVIS WITHOUT CONTRAST TECHNIQUE: Multidetector CT imaging of the chest, abdomen and pelvis was performed following the standard protocol without IV contrast. COMPARISON:  None. FINDINGS: CT CHEST FINDINGS Cardiovascular: No acute findings. Aortic and coronary artery atherosclerosis noted. Mediastinum/Lymph Nodes: No masses or pathologically enlarged lymph nodes identified on this unenhanced exam. Lungs/Pleura: Focal area of patchy ground-glass opacity is seen in the posterior right upper lobe on images 58 in 59/series 5. No other areas of pulmonary infiltrate, nodules, or masses identified. No evidence of pleural effusion. Musculoskeletal:  No suspicious bone lesions identified. CT ABDOMEN AND PELVIS FINDINGS Hepatobiliary: No masses visualized on this unenhanced exam. Gallbladder is unremarkable. No evidence of biliary ductal dilatation. Pancreas: No mass or inflammatory changes identified on this unenhanced exam. Spleen:  Within normal limits in size. Adrenals/Urinary Tract: A few tiny 1-2 mm renal calculi are noted, however there is no evidence of ureteral calculi or hydronephrosis. Unremarkable appearance of bladder. Stomach/Bowel: No evidence of obstruction, inflammatory process, or abnormal fluid collections. Normal appendix visualized. Vascular/Lymphatic: No pathologically enlarged lymph nodes identified. No abdominal aortic aneurysm. Aortic atherosclerosis noted. Reproductive:  Mildly enlarged prostate. Other:  None. Musculoskeletal:  No suspicious bone lesions identified. IMPRESSION: Focal ground-glass opacity in posterior right upper lobe, likely infectious or  inflammatory in etiology although low-grade adenocarcinoma cannot definitely be excluded. Recommend continued follow-up by chest CT in 6 months. This recommendation follows the consensus statement: Guidelines for Management of Small Pulmonary Nodules Detected on CT Images: From the Fleischner Society 2017; Radiology 2017; 284:228-243. No acute findings within the abdomen or pelvis. Bilateral nephrolithiasis. No evidence of ureteral calculi or hydronephrosis. Mildly enlarged prostate. Coronary artery atherosclerosis. Electronically Signed   By: Marlaine Hind M.D.   On: 03/19/2020 11:57   CT chest without contrast  Result Date: 03/19/2020 CLINICAL DATA:  Anemia due to chronic blood loss. Ischemic cardiomyopathy. EXAM: CT CHEST, ABDOMEN AND PELVIS  WITHOUT CONTRAST TECHNIQUE: Multidetector CT imaging of the chest, abdomen and pelvis was performed following the standard protocol without IV contrast. COMPARISON:  None. FINDINGS: CT CHEST FINDINGS Cardiovascular: No acute findings. Aortic and coronary artery atherosclerosis noted. Mediastinum/Lymph Nodes: No masses or pathologically enlarged lymph nodes identified on this unenhanced exam. Lungs/Pleura: Focal area of patchy ground-glass opacity is seen in the posterior right upper lobe on images 58 in 59/series 5. No other areas of pulmonary infiltrate, nodules, or masses identified. No evidence of pleural effusion. Musculoskeletal:  No suspicious bone lesions identified. CT ABDOMEN AND PELVIS FINDINGS Hepatobiliary: No masses visualized on this unenhanced exam. Gallbladder is unremarkable. No evidence of biliary ductal dilatation. Pancreas: No mass or inflammatory changes identified on this unenhanced exam. Spleen:  Within normal limits in size. Adrenals/Urinary Tract: A few tiny 1-2 mm renal calculi are noted, however there is no evidence of ureteral calculi or hydronephrosis. Unremarkable appearance of bladder. Stomach/Bowel: No evidence of obstruction, inflammatory  process, or abnormal fluid collections. Normal appendix visualized. Vascular/Lymphatic: No pathologically enlarged lymph nodes identified. No abdominal aortic aneurysm. Aortic atherosclerosis noted. Reproductive:  Mildly enlarged prostate. Other:  None. Musculoskeletal:  No suspicious bone lesions identified. IMPRESSION: Focal ground-glass opacity in posterior right upper lobe, likely infectious or inflammatory in etiology although low-grade adenocarcinoma cannot definitely be excluded. Recommend continued follow-up by chest CT in 6 months. This recommendation follows the consensus statement: Guidelines for Management of Small Pulmonary Nodules Detected on CT Images: From the Fleischner Society 2017; Radiology 2017; 284:228-243. No acute findings within the abdomen or pelvis. Bilateral nephrolithiasis. No evidence of ureteral calculi or hydronephrosis. Mildly enlarged prostate. Coronary artery atherosclerosis. Electronically Signed   By: Marlaine Hind M.D.   On: 03/19/2020 11:57    Cardiac Studies  TTE 03/18/2020 1. LVEF is depressed with hypokinesis/akinesis of the distal inferior,  mid/distal septal, distal lataral and apical walls.. Left ventricular  ejection fraction, by estimation, is 35%. The left ventricle has  moderately decreased function. The left  ventricular internal cavity size was mildly to moderately dilated.  2. Right ventricular systolic function is normal. The right ventricular  size is normal.  3. The mitral valve is normal in structure. Trivial mitral valve  regurgitation.  4. The aortic valve is normal in structure. Aortic valve regurgitation is  not visualized.  5. The inferior vena cava is normal in size with greater than 50%  respiratory variability, suggesting right atrial pressure of 3 mmHg.   LHC 03/18/2020  Severe diffuse three-vessel coronary artery disease in the 69 year old [name] gentleman with diabetes mellitus type 2.  20% mid body left main  disease.  Proximal LAD stent with mid body 40 to 60% restenosis depending upon review.  Severe multifocal diffuse mid to apical LAD.  Large diagonal with ostial 50% narrowing and 60 to 70% mid narrowing.  Severe diffuse disease circumflex coronary disease with proximal to mid body circumflex up to 90% narrowed.  Each of 3 obtuse marginals have high-grade obstructive disease.  The second and third obtuse marginals are large enough to be grafted.  Dominant right coronary with moderate diffuse disease including smaller distal branches.  Eccentric 50 to 70% healed ulcerated plaque in the mid vessel above the proximal margin of the previously placed stent.  There is distal high-grade in-stent restenosis that is somewhat hazy possibly representing thrombus.  PDA contains proximal 80% stenosis.  There is apical akinesis/dyskinesis with EF in the 35 to 40% range.  LVEDP is normal.   Patient Profile  Talor Herberth Deharo is a 69 y.o. male with CAD s/p PCI, DM, HTN admitted with unstable angina. Found to have 3v CAD and ischemic CM. Awaiting CABG.   Assessment & Plan   1. Unstable Angina/NSTEMI -ischemic CM with 3v CAD -CABG planned 7/15 -continue ASA/statin/losartan. Metoprolol increased to 50 mg today due to tachycardia.  -appears to be in NSR/ST. Will obtain EKG.  -volume status acceptable.   Cardiology will follow peripherally, please let us know if you have questions.   For questions or updates, please contact [place] Please consult [email or web address] for contact info under   Time Spent with Patient: I have spent a total of 15 minutes with patient reviewing hospital notes, telemetry, EKGs, labs and examining the patient as well as establishing an assessment and plan that was discussed with the patient.  > 50% of time was spent in direct patient care.    Signed, Addison Naegeli. Audie Box, [place]  03/21/2020 10:29 AM

## 2020-03-21 NOTE — Progress Notes (Signed)
PROGRESS NOTE    Jimmy Blankenship  SHF:[id number] DOB: 19-Feb-1951 DOA: 03/16/2020 PCP: [name], MD   Brief Narrative: Jimmy Blankenship is a 69 y.o. [name] speaking  male with medical history significant of coronary artery disease disease status post stenting, diabetes type 2, hypertension, hyperlipidemia, normocytic anemia who presents to the emergency department at [facility] with complaints of chest pain.  He was sent from [facility] to [facility] for further evaluation due to typical symptoms.  Troponins were negative.  High suspicion for unstable angina.    Also noted to have significant normocytic anemia with positive FOBT.  He underwent cardiac cath with finding of severe triple-vessel disease.  Cardiothoracic surgery consulted for possible CABG and he is scheduled on 03/24/2020 as he has to be off of Plavix for 5 days.  Assessment & Plan:   Principal Problem:   Nonspecific chest pain Active Problems:   CAD (coronary artery disease)   DM2 (diabetes mellitus, type 2) (HCC)   HTN (hypertension)   HLD (hyperlipidemia)   Normocytic anemia   AKI (acute kidney injury) ([place])   Chest pain    Unstable angina/typical chest pain/CAD: His pain was associated with exertion and relieved with nitroglycerin. History of stent placement about 11 years  ago in [place].  Follows with cardiology at [facility].  Takes Plavix, metoprolol, Lipitor, losartan .  Cardiac cath done here which showed severe diffuse three-vessel coronary disease with decreased left ventricular function with ejection fraction of 40%. Cardiothoracic surgery consulted.  They plan to do CABG on 03/24/2020.  Plavix was discontinued 03/18/2020.  Aspirin added.  Echo shows decreased ejection fraction with hypokinesis/akinesis of distal inferior, mid/distal septal, distal lateral and apical walls.  EF 35%.  He remains on heparin and is symptom-free now.  Continue aspirin, atorvastatin, losartan and Toprol increased today to 50 mg  p.o. daily by cardiology.  AKI: His baseline creatinine ranges from 1.1-1.2.  Elevated creatinine on presentation.  Now resolved.  Normocytic anemia: Baseline hemoglobin in the range of 7.  No history of melena or hematochezia.  He takes Plavix.  Positive stool occult blood.  Low iron as per iron studies.  High suspicion for GI bleed.  He has an appointment with gastroenterology at [facility]  GI consulted here and recommended to follow-up with his gastroenterologist as an outpatient for further work-up, no EGD/Colo planned here.  Recommended to continue Protonix. S/P a dose of IV iron.  Hemoglobin dropped to 7.7 on 03/18/2020 and he received 1 unit of peritransplant.  Posttransfusion hemoglobin today is 9.9.  Essential hypertension: Currently blood pressure stable.  Continue current medications.  Monitor blood pressure  Hyperlipidemia: On Lipitor  Right lung upper lobe opacity: CT chest without contrast shows focal groundglass opacity in posterior right upper lobe.  This could be inflammatory or infectious and low-grade adenocarcinoma cannot definitely be excluded.  He has no leukocytosis or any respiratory symptoms and he is afebrile.  I doubt pneumonia.  Follow-up CT in 6 months is recommended.  Diabetes type 2: On glipizide and Metformin at home.  Blood sugar slightly elevated.  Will start on 10 units Lantus and continue SSI.  Check hemoglobin A1c.      DVT prophylaxis: Heparin GTT Code Status: Full Family Communication: Family present at bedside. Status is: Observation  Status is: Inpatient  Remains inpatient appropriate because:Ongoing diagnostic testing needed not appropriate for outpatient work up   Dispo: The patient is from: Home  Anticipated d/c is to: Home              Anticipated d/c date is: > 3 days              Patient currently is not medically stable to d/c.   Consultants: Cardiology, gastroenterology, cardiothoracic  surgery  Procedures:None  Antimicrobials:  Anti-infectives (From admission, onward)   None      Subjective: Seen and examined.  He has no complaints.  Objective: Vitals:   03/21/20 0009 03/21/20 0416 03/21/20 0722 03/21/20 1118  BP: 110/69 106/72 97/70 109/74  Pulse: 92 95  93  Resp: 19 19  18   Temp: 97.6 F (36.4 C) 97.6 F (36.4 C) 97.7 F (36.5 C) 98.8 F (37.1 C)  TempSrc: Oral Oral Oral Oral  SpO2: 98% 98%  99%  Weight:      Height:        Intake/Output Summary (Last 24 hours) at 03/21/2020 1233 Last data filed at 03/21/2020 2706 Gross per 24 hour  Intake 684 ml  Output 750 ml  Net -66 ml   Filed Weights   03/15/20 2310 03/16/20 1628  Weight: 68 kg 65.9 kg    Examination:  General exam: Appears calm and comfortable  Respiratory system: Clear to auscultation. Respiratory effort normal. Cardiovascular system: S1 & S2 heard, RRR. No JVD, murmurs, rubs, gallops or clicks. No pedal edema. Gastrointestinal system: Abdomen is nondistended, soft and nontender. No organomegaly or masses felt. Normal bowel sounds heard. Central nervous system: Alert and oriented. No focal neurological deficits. Extremities: Symmetric 5 x 5 power. Skin: No rashes, lesions or ulcers.  Psychiatry: Judgement and insight appear normal. Mood & affect appropriate.   Data Reviewed: I have personally reviewed following labs and imaging studies  CBC: Recent Labs  Lab 03/17/20 0232 03/17/20 0232 03/18/20 1017 03/18/20 2015 03/19/20 0224 03/20/20 0146 03/21/20 0222  WBC 8.0   < > 5.3 5.2 6.7 6.8 8.5  NEUTROABS 4.2  --  3.6  --   --   --   --   HGB 8.5*   < > 7.7* 8.8* 9.9* 8.9* 8.8*  HCT 28.1*   < > 24.6* 27.9* 31.5* 27.8* 27.9*  MCV 83.1   < > 83.1 83.3 84.2 83.7 85.1  PLT 403*   < > 351 325 378 342 343   < > = values in this interval not displayed.   Basic Metabolic Panel: Recent Labs  Lab 03/15/20 2312 03/17/20 0232 03/18/20 1017 03/19/20 0224  NA 138 137 137 137  K  4.3 4.3 3.9 4.0  CL 103 105 109 106  CO2 23 23 22  21*  GLUCOSE 164* 106* 222* 166*  BUN 36* 24* 16 19  CREATININE 1.40* 1.18 1.17 1.21  CALCIUM 9.0 8.9 8.0* 8.6*   GFR: Estimated Creatinine Clearance: 55.2 mL/min (by C-G formula based on SCr of 1.21 mg/dL). Liver Function Tests: Recent Labs  Lab 03/19/20 0224  AST 20  ALT 20  ALKPHOS 61  BILITOT 1.1  PROT 6.0*  ALBUMIN 3.1*   No results for input(s): LIPASE, AMYLASE in the last 168 hours. No results for input(s): AMMONIA in the last 168 hours. Coagulation Profile: Recent Labs  Lab 03/19/20 0224  INR 1.1   Cardiac Enzymes: No results for input(s): CKTOTAL, CKMB, CKMBINDEX, TROPONINI in the last 168 hours. BNP (last 3 results) No results for input(s): PROBNP in the last 8760 hours. HbA1C: No results for input(s): HGBA1C in the last 72 hours. CBG: Recent  Labs  Lab 03/20/20 1127 03/20/20 1618 03/20/20 2134 03/21/20 0610 03/21/20 1135  GLUCAP 196* 134* 228* 152* 226*   Lipid Profile: No results for input(s): CHOL, HDL, LDLCALC, TRIG, CHOLHDL, LDLDIRECT in the last 72 hours. Thyroid Function Tests: Recent Labs    03/19/20 0224  TSH 1.167   Anemia Panel: No results for input(s): VITAMINB12, FOLATE, FERRITIN, TIBC, IRON, RETICCTPCT in the last 72 hours. Sepsis Labs: No results for input(s): PROCALCITON, LATICACIDVEN in the last 168 hours.  Recent Results (from the past 240 hour(s))  SARS Coronavirus 2 by RT PCR (hospital order, performed in [facility] hospital lab) Nasopharyngeal Nasopharyngeal Swab     Status: None   Collection Time: 03/16/20  2:40 AM   Specimen: Nasopharyngeal Swab  Result Value Ref Range Status   SARS Coronavirus 2 NEGATIVE NEGATIVE Final    Comment: (NOTE) SARS-CoV-2 target nucleic acids are NOT DETECTED.  The SARS-CoV-2 RNA is generally detectable in upper and lower respiratory specimens during the acute phase of infection. The lowest concentration of SARS-CoV-2 viral copies this  assay can detect is 250 copies / mL. A negative result does not preclude SARS-CoV-2 infection and should not be used as the sole basis for treatment or other patient management decisions.  A negative result may occur with improper specimen collection / handling, submission of specimen other than nasopharyngeal swab, presence of viral mutation(s) within the areas targeted by this assay, and inadequate number of viral copies (<250 copies / mL). A negative result must be combined with clinical observations, patient history, and epidemiological information.  Fact Sheet for Patients:   StrictlyIdeas.no  Fact Sheet for Healthcare Providers: BankingDealers.co.za  This test is not yet approved or  cleared by the [place] FDA and has been authorized for detection and/or diagnosis of SARS-CoV-2 by FDA under an Emergency Use Authorization (EUA).  This EUA will remain in effect (meaning this test can be used) for the duration of the COVID-19 declaration under Section 564(b)(1) of the Act, 21 U.S.C. section 360bbb-3(b)(1), unless the authorization is terminated or revoked sooner.  Performed at [facility], [address] [place], [place] [address]   MRSA PCR Screening     Status: None   Collection Time: 03/16/20  5:00 PM   Specimen: Nasal Mucosa; Nasopharyngeal  Result Value Ref Range Status   MRSA by PCR NEGATIVE NEGATIVE Final    Comment:        The GeneXpert MRSA Assay (FDA approved for NASAL specimens only), is one component of a comprehensive MRSA colonization surveillance program. It is not intended to diagnose MRSA infection nor to guide or monitor treatment for MRSA infections. Performed at [facility], [place] [address], [place], [place] [address]   Surgical pcr screen     Status: None   Collection Time: 03/18/20  4:27 PM   Specimen: Nasal Mucosa; Nasal Swab  Result Value Ref Range Status   MRSA, PCR  NEGATIVE NEGATIVE Final   Staphylococcus aureus NEGATIVE NEGATIVE Final    Comment: (NOTE) The Xpert SA Assay (FDA approved for NASAL specimens in patients 35 years of age and older), is one component of a comprehensive surveillance program. It is not intended to diagnose infection nor to guide or monitor treatment. Performed at [facility], [place] [address], [place], [place] [address]          Radiology Studies: No results found.      Scheduled Meds: . aspirin  81 mg Oral  Daily  . atorvastatin  80 mg Oral Daily  . insulin aspart  0-5 Units Subcutaneous QHS  . insulin aspart  0-9 Units Subcutaneous TID WC  . losartan  50 mg Oral Daily  . [START ON 03/22/2020] metoprolol succinate  50 mg Oral Daily  . pantoprazole  40 mg Oral Daily  . sodium chloride flush  3 mL Intravenous Once  . sodium chloride flush  3 mL Intravenous Q12H   Continuous Infusions: . sodium chloride    . heparin 650 Units/hr (03/21/20 0400)     LOS: 3 days   Time spent 25 minutes  Darliss Cheney, MD [facility] P7/08/2020, 12:33 PM

## 2020-03-21 NOTE — Progress Notes (Signed)
ANTICOAGULATION CONSULT NOTE  Pharmacy Consult for heparin Indication:  CAD awaiting CABG  No Known Allergies  Patient Measurements: Height: 5\' 8"  (172.7 cm) Weight: 65.9 kg (145 lb 4.5 oz) IBW/kg (Calculated) : 68.4 Vital Signs: Temp: 98.8 F (37.1 C) (07/12 1118) Temp Source: Oral (07/12 1118) BP: 109/74 (07/12 1118) Pulse Rate: 93 (07/12 1118)  Labs: Recent Labs    03/19/20 0224 03/19/20 0224 03/19/20 1035 03/19/20 1957 03/20/20 0146 03/21/20 0222  HGB 9.9*   < >  --   --  8.9* 8.8*  HCT 31.5*  --   --   --  27.8* 27.9*  PLT 378  --   --   --  342 343  LABPROT 13.8  --   --   --   --   --   INR 1.1  --   --   --   --   --   HEPARINUNFRC 0.92*  --    < > 0.46 0.54 0.41  CREATININE 1.21  --   --   --   --   --    < > = values in this interval not displayed.    Estimated Creatinine Clearance: 55.2 mL/min (by C-G formula based on SCr of 1.21 mg/dL).  Assessment: 81 yoM s/p cath with 3vCAD. Surgery consult pending, IV heparin resumed.  Heparin level remains therapeutic at 0.41 on 650 units/hr, CBC low but stable.  Goal of Therapy:  Heparin level 0.3-0.7 units/ml Monitor platelets by anticoagulation protocol: Yes   Plan:  -Continue IV heparin at 650 units/hr. -Daily heparin level and CBC.  Arrie Senate, PharmD, BCPS Clinical Pharmacist [phone] Please check AMION for all [place] numbers 03/21/2020

## 2020-03-22 ENCOUNTER — Other Ambulatory Visit: Payer: Self-pay | Admitting: Cardiothoracic Surgery

## 2020-03-22 LAB — CBC
HCT: 27.6 % — ABNORMAL LOW (ref 39.0–52.0)
Hemoglobin: 8.5 g/dL — ABNORMAL LOW (ref 13.0–17.0)
MCH: 26.2 pg (ref 26.0–34.0)
MCHC: 30.8 g/dL (ref 30.0–36.0)
MCV: 85.2 fL (ref 80.0–100.0)
Platelets: 331 10*3/uL (ref 150–400)
RBC: 3.24 MIL/uL — ABNORMAL LOW (ref 4.22–5.81)
RDW: 15.7 % — ABNORMAL HIGH (ref 11.5–15.5)
WBC: 7.9 10*3/uL (ref 4.0–10.5)
nRBC: 0 % (ref 0.0–0.2)

## 2020-03-22 LAB — GLUCOSE, CAPILLARY
Glucose-Capillary: 137 mg/dL — ABNORMAL HIGH (ref 70–99)
Glucose-Capillary: 221 mg/dL — ABNORMAL HIGH (ref 70–99)
Glucose-Capillary: 335 mg/dL — ABNORMAL HIGH (ref 70–99)
Glucose-Capillary: 342 mg/dL — ABNORMAL HIGH (ref 70–99)
Glucose-Capillary: 223 mg/dL — ABNORMAL HIGH (ref 70–99)

## 2020-03-22 LAB — HEPARIN LEVEL (UNFRACTIONATED): Heparin Unfractionated: 0.41 IU/mL (ref 0.30–0.70)

## 2020-03-22 MED ORDER — SORBITOL 70 % SOLN
30.0000 mL | Freq: Once | Status: AC
  Administered 2020-03-22: 30 mL via ORAL
  Filled 2020-03-22: qty 30

## 2020-03-22 NOTE — Plan of Care (Signed)
  Problem: Education: Goal: Knowledge of General Education information will improve Description: Including pain rating scale, medication(s)/side effects and non-pharmacologic comfort measures Outcome: Progressing   Problem: Clinical Measurements: Goal: Will remain free from infection Outcome: Progressing Goal: Diagnostic test results will improve Outcome: Progressing Goal: Respiratory complications will improve Outcome: Progressing   

## 2020-03-22 NOTE — Progress Notes (Signed)
      [address]       [place] [address]             [phone]        Denies chest pain.  Plan for CABG on Thursday with Dr. Osa Craver, PA-C

## 2020-03-22 NOTE — Progress Notes (Signed)
ANTICOAGULATION CONSULT NOTE  Pharmacy Consult for Heparin Indication:  CAD awaiting CABG Planned 7/15  No Known Allergies  Patient Measurements: Height: 5\' 8"  (172.7 cm) Weight: 65.9 kg (145 lb 4.5 oz) IBW/kg (Calculated) : 68.4  Vital Signs: Temp: 98.4 F (36.9 C) (07/13 0802) Temp Source: Oral (07/13 0802) BP: 104/58 (07/13 0802) Pulse Rate: 94 (07/13 0802)  Labs: Recent Labs    03/20/20 0146 03/20/20 0146 03/21/20 0222 03/22/20 0236  HGB 8.9*   < > 8.8* 8.5*  HCT 27.8*  --  27.9* 27.6*  PLT 342  --  343 331  HEPARINUNFRC 0.54  --  0.41 0.41   < > = values in this interval not displayed.    Estimated Creatinine Clearance: 55.2 mL/min (by C-G formula based on SCr of 1.21 mg/dL).  Assessment: 47 yoM s/p cath with 3vCAD. CABG planned for Thursday.   Heparin level remains therapeutic at 0.41 on 650 units/hr, CBC low but stable.  Goal of Therapy:  Heparin level 0.3-0.7 units/ml Monitor platelets by anticoagulation protocol: Yes   Plan:  -Continue IV heparin at 650 units/hr. -Daily heparin level and CBC.  Norina Buzzard, PharmD PGY1 Pharmacy Resident Phone: [phone] 03/22/2020 9:29 AM

## 2020-03-22 NOTE — Progress Notes (Signed)
Pt declined ambulation in hall but sts he has been ambulating in room 10 min at a time. Used Ipad interpreter and discussed sternal precautions, IS (2500 mL), mobility post op and d/c planning. Pt receptive and had appropriate questions. Gave him materials albeit in [place]. Set up preop video for pt to view.  [place], ACSM 2:14 PM 03/22/2020

## 2020-03-22 NOTE — Progress Notes (Signed)
PROGRESS NOTE    Jimmy Blankenship  XIP:[id number] DOB: 01-31-1951 DOA: 03/16/2020 PCP: [name], MD   Brief Narrative: Jimmy Blankenship is a 69 y.o. [name] speaking  male with medical history significant of coronary artery disease disease status post stenting, diabetes type 2, hypertension, hyperlipidemia, normocytic anemia who presents to the emergency department at [facility] with complaints of chest pain.  He was sent from [facility] to [facility] for further evaluation due to typical symptoms.  Troponins were negative.  High suspicion for unstable angina.    Also noted to have significant normocytic anemia with positive FOBT.  He underwent cardiac cath with finding of severe triple-vessel disease.  Cardiothoracic surgery consulted for possible CABG and he is scheduled on 03/24/2020 as he has to be off of Plavix for 5 days.  Assessment & Plan:   Principal Problem:   Nonspecific chest pain Active Problems:   CAD (coronary artery disease)   DM2 (diabetes mellitus, type 2) (HCC)   HTN (hypertension)   HLD (hyperlipidemia)   Normocytic anemia   AKI (acute kidney injury) ([place])   Chest pain    Unstable angina/typical chest pain/CAD: His pain was associated with exertion and relieved with nitroglycerin. History of stent placement about 11 years  ago in [place].  Follows with cardiology at [facility].  Takes Plavix, metoprolol, Lipitor, losartan .  Cardiac cath done here which showed severe diffuse three-vessel coronary disease with decreased left ventricular function with ejection fraction of 40%. Cardiothoracic surgery consulted.  They plan to do CABG on 03/24/2020.  Plavix was discontinued 03/18/2020.  Aspirin added.  Echo shows decreased ejection fraction with hypokinesis/akinesis of distal inferior, mid/distal septal, distal lateral and apical walls.  EF 35%.  He remains on heparin and is symptom-free now.  Continue aspirin, atorvastatin, losartan and Toprol increased today to 50 mg  p.o. daily by cardiology.  Remains symptom-free.  AKI: His baseline creatinine ranges from 1.1-1.2.  Elevated creatinine on presentation.  Now resolved.  Normocytic anemia: Baseline hemoglobin in the range of 7.  No history of melena or hematochezia.  He takes Plavix.  Positive stool occult blood.  Low iron as per iron studies.  High suspicion for GI bleed.  He has an appointment with gastroenterology at [facility].  GI consulted here and recommended to follow-up with his gastroenterologist as an outpatient for further work-up, no EGD/Colo planned here.  Recommended to continue Protonix. S/P a dose of IV iron.  Hemoglobin dropped to 7.7 on 03/18/2020 and he received 1 unit of peritransplant.  Posttransfusion hemoglobin today is 9.9.  Essential hypertension: Currently blood pressure stable.  Continue current medications.  Monitor blood pressure  Hyperlipidemia: On Lipitor  Right lung upper lobe opacity: CT chest without contrast shows focal groundglass opacity in posterior right upper lobe.  This could be inflammatory or infectious and low-grade adenocarcinoma cannot definitely be excluded.  He has no leukocytosis or any respiratory symptoms and he is afebrile.  I doubt pneumonia.  Follow-up CT in 6 months is recommended.  Diabetes type 2: On glipizide and Metformin at home.  Blood sugar better controlled.  Continue tenderness of Lantus along with SSI.      DVT prophylaxis: Heparin GTT Code Status: Full Family Communication: No family present at bedside.  Status is: Inpatient  Remains inpatient appropriate because:Ongoing diagnostic testing needed not appropriate for outpatient work up   Dispo: The patient is from: Home  Anticipated d/c is to: Home              Anticipated d/c date is: > 3 days              Patient currently is not medically stable to d/c.   Consultants: Cardiology, gastroenterology, cardiothoracic surgery  Procedures:None  Antimicrobials:    Anti-infectives (From admission, onward)   None      Subjective: Patient seen and examined at bedside.  No complaints.  Objective: Vitals:   03/21/20 1919 03/21/20 2332 03/22/20 0352 03/22/20 0802  BP: (!) 104/52 109/63 102/73 (!) 104/58  Pulse: 94 90 87 94  Resp: 15 20 14 14   Temp: 98.3 F (36.8 C) 98.3 F (36.8 C) 98.1 F (36.7 C) 98.4 F (36.9 C)  TempSrc: Oral Oral Oral Oral  SpO2: 98% 98% 99% 100%  Weight:      Height:        Intake/Output Summary (Last 24 hours) at 03/22/2020 1053 Last data filed at 03/22/2020 0804 Gross per 24 hour  Intake 875.99 ml  Output 1600 ml  Net -724.01 ml   Filed Weights   03/15/20 2310 03/16/20 1628  Weight: 68 kg 65.9 kg    Examination:  General exam: Appears calm and comfortable  Respiratory system: Clear to auscultation. Respiratory effort normal. Cardiovascular system: S1 & S2 heard, RRR. No JVD, murmurs, rubs, gallops or clicks. No pedal edema. Gastrointestinal system: Abdomen is nondistended, soft and nontender. No organomegaly or masses felt. Normal bowel sounds heard. Central nervous system: Alert and oriented. No focal neurological deficits. Extremities: Symmetric 5 x 5 power. Skin: No rashes, lesions or ulcers.  Psychiatry: Judgement and insight appear normal. Mood & affect appropriate.   Data Reviewed: I have personally reviewed following labs and imaging studies  CBC: Recent Labs  Lab 03/17/20 0232 03/17/20 0232 03/18/20 1017 03/18/20 1017 03/18/20 2015 03/19/20 0224 03/20/20 0146 03/21/20 0222 03/22/20 0236  WBC 8.0   < > 5.3   < > 5.2 6.7 6.8 8.5 7.9  NEUTROABS 4.2  --  3.6  --   --   --   --   --   --   HGB 8.5*   < > 7.7*   < > 8.8* 9.9* 8.9* 8.8* 8.5*  HCT 28.1*   < > 24.6*   < > 27.9* 31.5* 27.8* 27.9* 27.6*  MCV 83.1   < > 83.1   < > 83.3 84.2 83.7 85.1 85.2  PLT 403*   < > 351   < > 325 378 342 343 331   < > = values in this interval not displayed.   Basic Metabolic Panel: Recent Labs  Lab  03/15/20 2312 03/17/20 0232 03/18/20 1017 03/19/20 0224  NA 138 137 137 137  K 4.3 4.3 3.9 4.0  CL 103 105 109 106  CO2 23 23 22  21*  GLUCOSE 164* 106* 222* 166*  BUN 36* 24* 16 19  CREATININE 1.40* 1.18 1.17 1.21  CALCIUM 9.0 8.9 8.0* 8.6*   GFR: Estimated Creatinine Clearance: 55.2 mL/min (by C-G formula based on SCr of 1.21 mg/dL). Liver Function Tests: Recent Labs  Lab 03/19/20 0224  AST 20  ALT 20  ALKPHOS 61  BILITOT 1.1  PROT 6.0*  ALBUMIN 3.1*   No results for input(s): LIPASE, AMYLASE in the last 168 hours. No results for input(s): AMMONIA in the last 168 hours. Coagulation Profile: Recent Labs  Lab 03/19/20 0224  INR 1.1   Cardiac Enzymes: No  results for input(s): CKTOTAL, CKMB, CKMBINDEX, TROPONINI in the last 168 hours. BNP (last 3 results) No results for input(s): PROBNP in the last 8760 hours. HbA1C: Recent Labs    03/21/20 0222  HGBA1C 6.4*   CBG: Recent Labs  Lab 03/21/20 0610 03/21/20 1135 03/21/20 1641 03/21/20 2151 03/22/20 0612  GLUCAP 152* 226* 175* 158* 137*   Lipid Profile: No results for input(s): CHOL, HDL, LDLCALC, TRIG, CHOLHDL, LDLDIRECT in the last 72 hours. Thyroid Function Tests: No results for input(s): TSH, T4TOTAL, FREET4, T3FREE, THYROIDAB in the last 72 hours. Anemia Panel: No results for input(s): VITAMINB12, FOLATE, FERRITIN, TIBC, IRON, RETICCTPCT in the last 72 hours. Sepsis Labs: No results for input(s): PROCALCITON, LATICACIDVEN in the last 168 hours.  Recent Results (from the past 240 hour(s))  SARS Coronavirus 2 by RT PCR (hospital order, performed in [facility] hospital lab) Nasopharyngeal Nasopharyngeal Swab     Status: None   Collection Time: 03/16/20  2:40 AM   Specimen: Nasopharyngeal Swab  Result Value Ref Range Status   SARS Coronavirus 2 NEGATIVE NEGATIVE Final    Comment: (NOTE) SARS-CoV-2 target nucleic acids are NOT DETECTED.  The SARS-CoV-2 RNA is generally detectable in upper and  lower respiratory specimens during the acute phase of infection. The lowest concentration of SARS-CoV-2 viral copies this assay can detect is 250 copies / mL. A negative result does not preclude SARS-CoV-2 infection and should not be used as the sole basis for treatment or other patient management decisions.  A negative result may occur with improper specimen collection / handling, submission of specimen other than nasopharyngeal swab, presence of viral mutation(s) within the areas targeted by this assay, and inadequate number of viral copies (<250 copies / mL). A negative result must be combined with clinical observations, patient history, and epidemiological information.  Fact Sheet for Patients:   StrictlyIdeas.no  Fact Sheet for Healthcare Providers: BankingDealers.co.za  This test is not yet approved or  cleared by the [place] FDA and has been authorized for detection and/or diagnosis of SARS-CoV-2 by FDA under an Emergency Use Authorization (EUA).  This EUA will remain in effect (meaning this test can be used) for the duration of the COVID-19 declaration under Section 564(b)(1) of the Act, 21 U.S.C. section 360bbb-3(b)(1), unless the authorization is terminated or revoked sooner.  Performed at [facility], [address] [place], [place] [address]   MRSA PCR Screening     Status: None   Collection Time: 03/16/20  5:00 PM   Specimen: Nasal Mucosa; Nasopharyngeal  Result Value Ref Range Status   MRSA by PCR NEGATIVE NEGATIVE Final    Comment:        The GeneXpert MRSA Assay (FDA approved for NASAL specimens only), is one component of a comprehensive MRSA colonization surveillance program. It is not intended to diagnose MRSA infection nor to guide or monitor treatment for MRSA infections. Performed at [facility], [place] [address] [place], [place] [address]   Surgical pcr screen     Status: None    Collection Time: 03/18/20  4:27 PM   Specimen: Nasal Mucosa; Nasal Swab  Result Value Ref Range Status   MRSA, PCR NEGATIVE NEGATIVE Final   Staphylococcus aureus NEGATIVE NEGATIVE Final    Comment: (NOTE) The Xpert SA Assay (FDA approved for NASAL specimens in patients 53 years of age and older), is one component of a comprehensive surveillance program. It is not intended to diagnose infection nor to guide  or monitor treatment. Performed at [facility], [place] [address] [place], [place] [address]          Radiology Studies: No results found.      Scheduled Meds: . aspirin  81 mg Oral Daily  . atorvastatin  80 mg Oral Daily  . insulin aspart  0-5 Units Subcutaneous QHS  . insulin aspart  0-9 Units Subcutaneous TID WC  . insulin glargine  10 Units Subcutaneous Daily  . losartan  50 mg Oral Daily  . metoprolol succinate  50 mg Oral Daily  . pantoprazole  40 mg Oral Daily  . sodium chloride flush  3 mL Intravenous Once  . sodium chloride flush  3 mL Intravenous Q12H  . sorbitol  30 mL Oral Once   Continuous Infusions: . sodium chloride    . heparin 650 Units/hr (03/22/20 0400)     LOS: 4 days   Time spent 24 minutes  Darliss Cheney, MD [facility] P7/13/2021, 10:53 AM

## 2020-03-23 ENCOUNTER — Inpatient Hospital Stay (HOSPITAL_COMMUNITY): Payer: Medicare Other

## 2020-03-23 LAB — GLUCOSE, CAPILLARY
Glucose-Capillary: 156 mg/dL — ABNORMAL HIGH (ref 70–99)
Glucose-Capillary: 315 mg/dL — ABNORMAL HIGH (ref 70–99)
Glucose-Capillary: 207 mg/dL — ABNORMAL HIGH (ref 70–99)
Glucose-Capillary: 249 mg/dL — ABNORMAL HIGH (ref 70–99)

## 2020-03-23 LAB — HEPARIN LEVEL (UNFRACTIONATED): Heparin Unfractionated: 0.37 IU/mL (ref 0.30–0.70)

## 2020-03-23 LAB — COMPREHENSIVE METABOLIC PANEL
ALT: 44 U/L (ref 0–44)
AST: 31 U/L (ref 15–41)
Albumin: 2.8 g/dL — ABNORMAL LOW (ref 3.5–5.0)
Alkaline Phosphatase: 60 U/L (ref 38–126)
Anion gap: 10 (ref 5–15)
BUN: 19 mg/dL (ref 8–23)
CO2: 22 mmol/L (ref 22–32)
Calcium: 8.6 mg/dL — ABNORMAL LOW (ref 8.9–10.3)
Chloride: 105 mmol/L (ref 98–111)
Creatinine, Ser: 1.22 mg/dL (ref 0.61–1.24)
GFR calc Af Amer: >60 mL/min (ref >60)
GFR calc non Af Amer: >60 mL/min (ref >60)
Glucose, Bld: 166 mg/dL — ABNORMAL HIGH (ref 70–99)
Potassium: 4.5 mmol/L (ref 3.5–5.1)
Sodium: 137 mmol/L (ref 135–145)
Total Bilirubin: 0.4 mg/dL (ref 0.3–1.2)
Total Protein: 5.7 g/dL — ABNORMAL LOW (ref 6.5–8.1)

## 2020-03-23 LAB — CBC
HCT: 27.7 % — ABNORMAL LOW (ref 39.0–52.0)
Hemoglobin: 8.6 g/dL — ABNORMAL LOW (ref 13.0–17.0)
MCH: 26.6 pg (ref 26.0–34.0)
MCHC: 31 g/dL (ref 30.0–36.0)
MCV: 85.8 fL (ref 80.0–100.0)
Platelets: 324 10*3/uL (ref 150–400)
RBC: 3.23 MIL/uL — ABNORMAL LOW (ref 4.22–5.81)
RDW: 16.3 % — ABNORMAL HIGH (ref 11.5–15.5)
WBC: 6.5 10*3/uL (ref 4.0–10.5)
nRBC: 0 % (ref 0.0–0.2)

## 2020-03-23 LAB — PREPARE RBC (CROSSMATCH)

## 2020-03-23 IMAGING — CR DG CHEST 2V
2 series · 2 of 2 positions shown · non-contrast
Comparison: [DATE].

CLINICAL DATA: Chest pain.

EXAM:
CHEST - 2 VIEW

[chest lat]
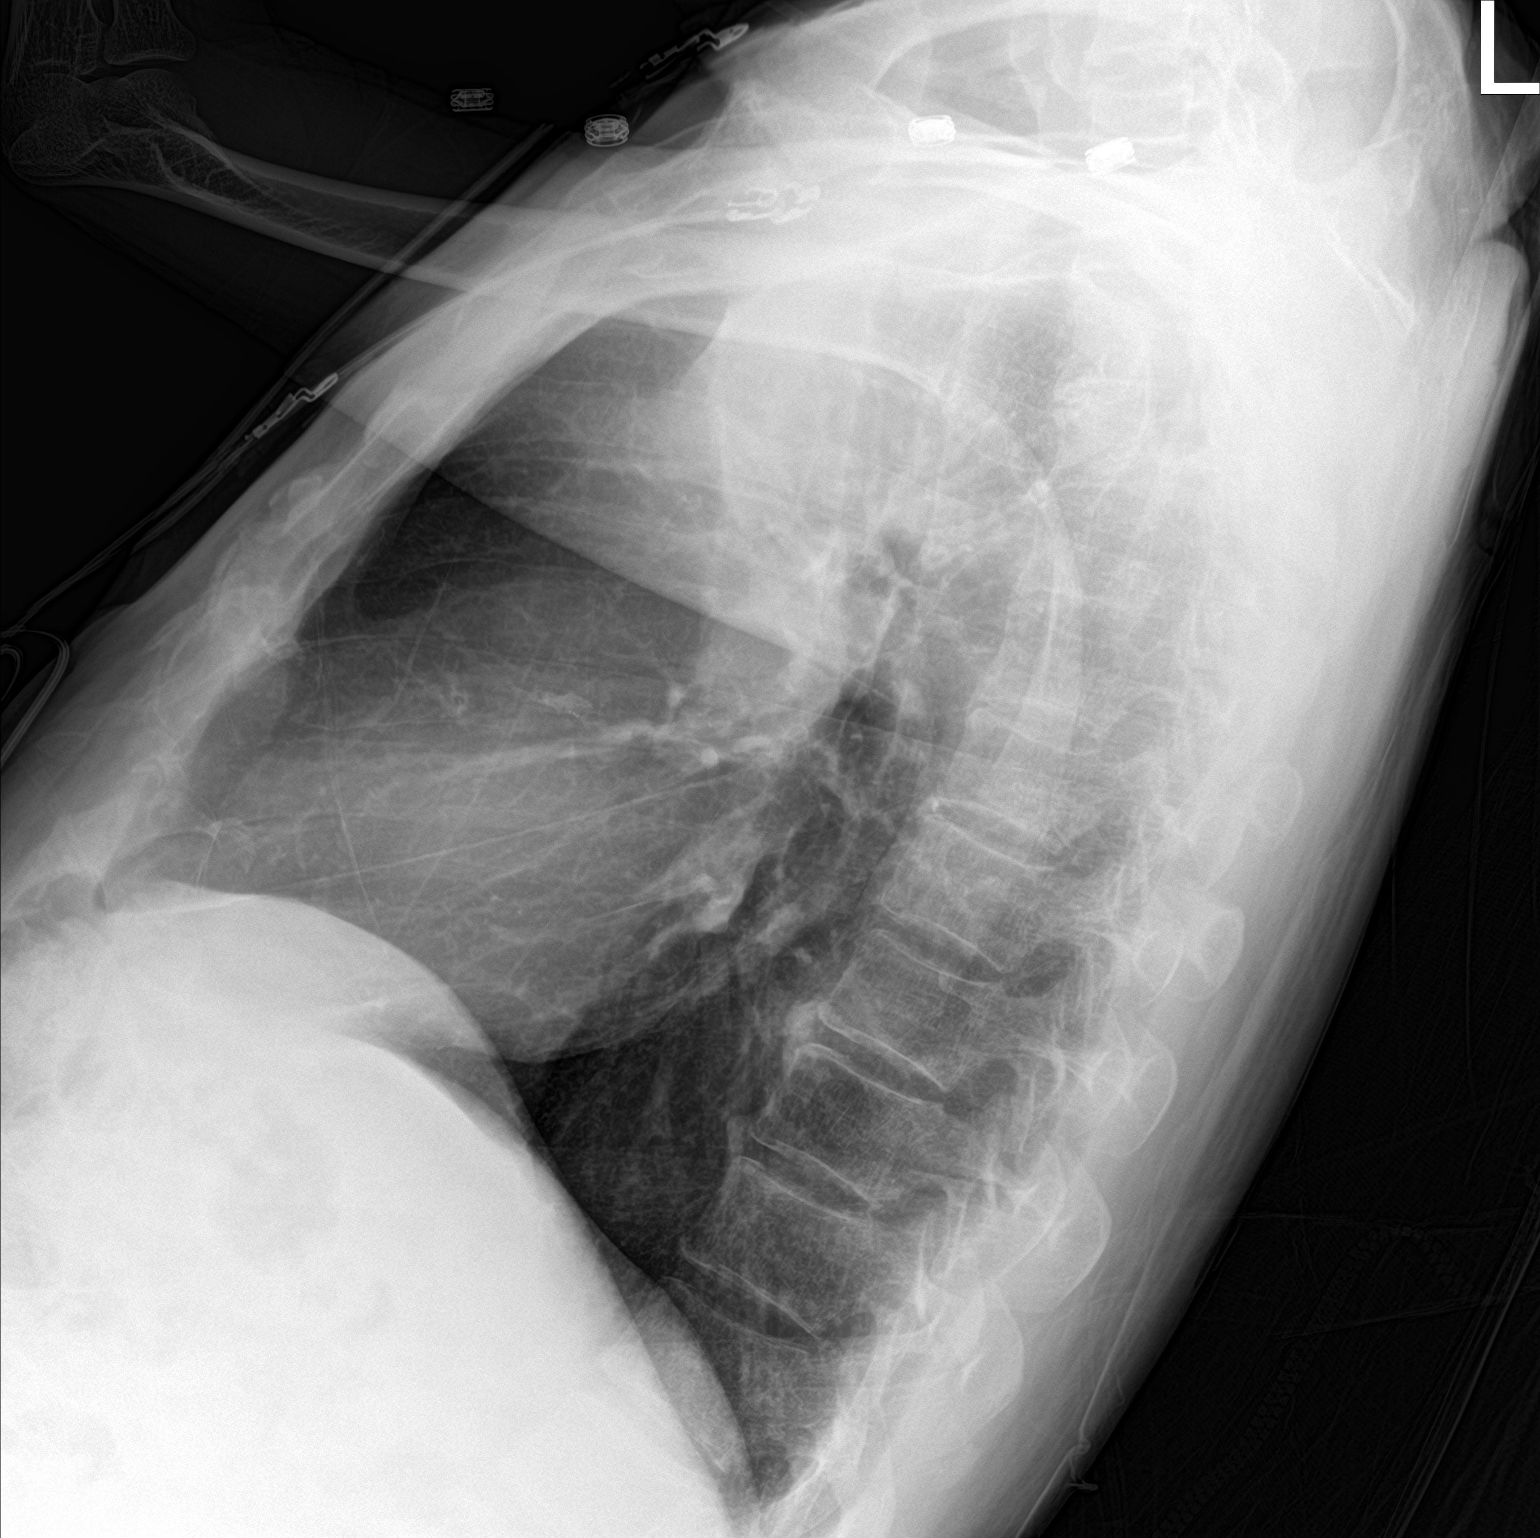

[chest ap]
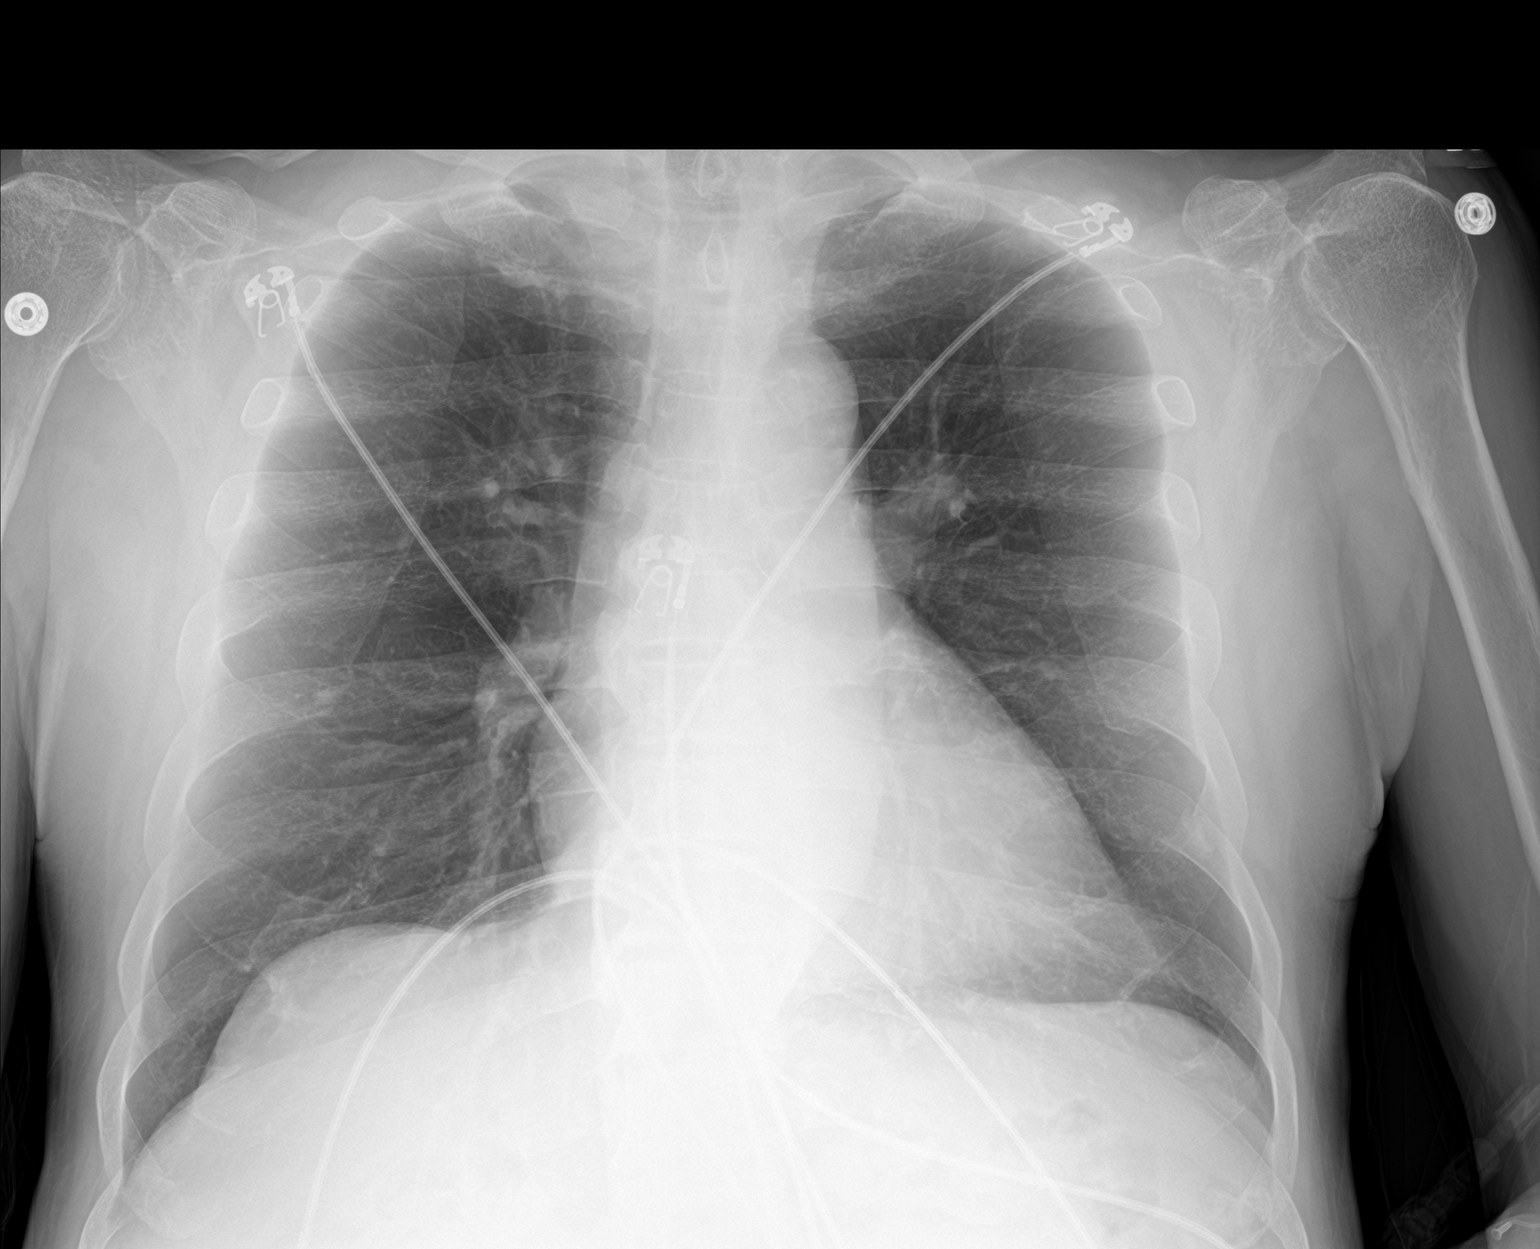

[2 of 2 positions shown; findings below may reference images not displayed]

FINDINGS: The heart size and mediastinal contours are within normal limits.
Both lungs are clear. No pneumothorax or pleural effusion is noted.
The visualized skeletal structures are unremarkable.
IMPRESSION: No active cardiopulmonary disease.

## 2020-03-23 MED ORDER — EPINEPHRINE HCL 5 MG/250ML IV SOLN IN NS
0.0000 ug/min | INTRAVENOUS | Status: DC
  Filled 2020-03-23: qty 250

## 2020-03-23 MED ORDER — BISACODYL 5 MG PO TBEC: 5.0000 mg | DELAYED_RELEASE_TABLET | Freq: Once | ORAL | Status: DC

## 2020-03-23 MED ORDER — SODIUM CHLORIDE 0.9 % IV SOLN
INTRAVENOUS | Status: DC
  Filled 2020-03-23: qty 30

## 2020-03-23 MED ORDER — TEMAZEPAM 15 MG PO CAPS: 15.0000 mg | ORAL_CAPSULE | Freq: Once | ORAL | Status: DC | PRN

## 2020-03-23 MED ORDER — SODIUM CHLORIDE 0.9 % IV SOLN
1.5000 g | INTRAVENOUS | Status: AC
  Administered 2020-03-24: 1.5 g via INTRAVENOUS
  Filled 2020-03-23: qty 1.5

## 2020-03-23 MED ORDER — POTASSIUM CHLORIDE 2 MEQ/ML IV SOLN
80.0000 meq | INTRAVENOUS | Status: DC
  Filled 2020-03-23: qty 40

## 2020-03-23 MED ORDER — CHLORHEXIDINE GLUCONATE 4 % EX LIQD
60.0000 mL | Freq: Once | CUTANEOUS | Status: AC
  Administered 2020-03-23: 4 via TOPICAL
  Filled 2020-03-23: qty 60

## 2020-03-23 MED ORDER — TRANEXAMIC ACID (OHS) PUMP PRIME SOLUTION
2.0000 mg/kg | INTRAVENOUS | Status: DC
  Filled 2020-03-23: qty 1.32

## 2020-03-23 MED ORDER — VANCOMYCIN HCL 1250 MG/250ML IV SOLN
1250.0000 mg | INTRAVENOUS | Status: AC
  Administered 2020-03-24: 1250 mg via INTRAVENOUS
  Filled 2020-03-23: qty 250

## 2020-03-23 MED ORDER — PLASMA-LYTE 148 IV SOLN
INTRAVENOUS | Status: DC
  Filled 2020-03-23: qty 2.5

## 2020-03-23 MED ORDER — PHENYLEPHRINE HCL-NACL 20-0.9 MG/250ML-% IV SOLN
30.0000 ug/min | INTRAVENOUS | Status: AC
  Administered 2020-03-24: 50 ug/min via INTRAVENOUS
  Filled 2020-03-23: qty 250

## 2020-03-23 MED ORDER — MAGNESIUM SULFATE 50 % IJ SOLN
40.0000 meq | INTRAMUSCULAR | Status: DC
  Filled 2020-03-23: qty 9.85

## 2020-03-23 MED ORDER — DIAZEPAM 2 MG PO TABS
2.0000 mg | ORAL_TABLET | Freq: Once | ORAL | Status: AC
  Administered 2020-03-24: 2 mg via ORAL
  Filled 2020-03-23: qty 1

## 2020-03-23 MED ORDER — SODIUM CHLORIDE 0.9 % IV SOLN
750.0000 mg | INTRAVENOUS | Status: AC
  Administered 2020-03-24: 750 mg via INTRAVENOUS
  Filled 2020-03-23: qty 750

## 2020-03-23 MED ORDER — CHLORHEXIDINE GLUCONATE 4 % EX LIQD
60.0000 mL | Freq: Once | CUTANEOUS | Status: AC
  Administered 2020-03-24: 4 via TOPICAL

## 2020-03-23 MED ORDER — TRANEXAMIC ACID 1000 MG/10ML IV SOLN
1.5000 mg/kg/h | INTRAVENOUS | Status: AC
  Administered 2020-03-24: 1.5 mg/kg/h via INTRAVENOUS
  Filled 2020-03-23: qty 25

## 2020-03-23 MED ORDER — MILRINONE LACTATE IN DEXTROSE 20-5 MG/100ML-% IV SOLN
0.3000 ug/kg/min | INTRAVENOUS | Status: AC
  Administered 2020-03-24: .25 ug/kg/min via INTRAVENOUS
  Filled 2020-03-23: qty 100

## 2020-03-23 MED ORDER — TRANEXAMIC ACID (OHS) BOLUS VIA INFUSION
15.0000 mg/kg | INTRAVENOUS | Status: AC
  Administered 2020-03-24: 988.5 mg via INTRAVENOUS
  Filled 2020-03-23: qty 989

## 2020-03-23 MED ORDER — METOPROLOL TARTRATE 12.5 MG HALF TABLET
12.5000 mg | ORAL_TABLET | Freq: Once | ORAL | Status: AC
  Administered 2020-03-24: 12.5 mg via ORAL
  Filled 2020-03-23: qty 1

## 2020-03-23 MED ORDER — INSULIN REGULAR(HUMAN) IN NACL 100-0.9 UT/100ML-% IV SOLN
INTRAVENOUS | Status: AC
  Administered 2020-03-24: 1 [IU]/h via INTRAVENOUS
  Filled 2020-03-23: qty 100

## 2020-03-23 MED ORDER — NITROGLYCERIN IN D5W 200-5 MCG/ML-% IV SOLN
2.0000 ug/min | INTRAVENOUS | Status: AC
  Administered 2020-03-24: 16.667 ug/min via INTRAVENOUS
  Filled 2020-03-23: qty 250

## 2020-03-23 MED ORDER — INSULIN GLARGINE 100 UNIT/ML ~~LOC~~ SOLN
15.0000 [IU] | Freq: Every day | SUBCUTANEOUS | Status: DC
  Administered 2020-03-23: 15 [IU] via SUBCUTANEOUS
  Filled 2020-03-23 (×2): qty 0.15

## 2020-03-23 MED ORDER — DEXMEDETOMIDINE HCL IN NACL 400 MCG/100ML IV SOLN
0.1000 ug/kg/h | INTRAVENOUS | Status: AC
  Administered 2020-03-24: .5 ug/kg/h via INTRAVENOUS
  Filled 2020-03-23: qty 100

## 2020-03-23 MED ORDER — NOREPINEPHRINE 4 MG/250ML-% IV SOLN
0.0000 ug/min | INTRAVENOUS | Status: AC
  Administered 2020-03-24: 4 ug/min via INTRAVENOUS
  Filled 2020-03-23: qty 250

## 2020-03-23 MED ORDER — CHLORHEXIDINE GLUCONATE 0.12 % MT SOLN
15.0000 mL | Freq: Once | OROMUCOSAL | Status: AC
  Administered 2020-03-24: 15 mL via OROMUCOSAL
  Filled 2020-03-23: qty 15

## 2020-03-23 NOTE — Progress Notes (Signed)
6 Days Post-Op Procedure(s) (LRB): LEFT HEART CATH AND CORONARY ANGIOGRAPHY (N/A) Subjective: Stable on iv heparin Ready for CABG in am after plavix washout I have discussed the procedure, benefits and risks with the patients daughter-interpretor and she agrees with plan  Objective: Vital signs in last 24 hours: Temp:  [98.2 F (36.8 C)-98.9 F (37.2 C)] 98.7 F (37.1 C) (07/14 0755) Pulse Rate:  [80-96] 96 (07/14 0755) Cardiac Rhythm: Normal sinus rhythm (07/14 0755) Resp:  [15-21] 20 (07/14 0755) BP: (95-109)/(61-73) 95/61 (07/14 0755) SpO2:  [96 %-99 %] 99 % (07/14 0755)  Hemodynamic parameters for last 24 hours:    Intake/Output from previous day: 07/13 0701 - 07/14 0700 In: 394.4 [P.O.:240; I.V.:154.4] Out: 1600 [Urine:1600] Intake/Output this shift: Total I/O In: -  Out: 350 [Urine:350]       Exam    General- alert and comfortable    Neck- no JVD, no cervical adenopathy palpable, no carotid bruit   Lungs- clear without rales, wheezes   Cor- regular rate and rhythm, no murmur , gallop   Abdomen- soft, non-tender   Extremities - warm, non-tender, minimal edema   Neuro- oriented, appropriate, no focal weakness   Lab Results: Recent Labs    03/22/20 0236 03/23/20 0318  WBC 7.9 6.5  HGB 8.5* 8.6*  HCT 27.6* 27.7*  PLT 331 324   BMET:  Recent Labs    03/23/20 0318  NA 137  K 4.5  CL 105  CO2 22  GLUCOSE 166*  BUN 19  CREATININE 1.22  CALCIUM 8.6*    PT/INR: No results for input(s): LABPROT, INR in the last 72 hours. ABG    Component Value Date/Time   PHART 7.428 03/19/2020 0630   HCO3 24.0 03/19/2020 0630   O2SAT 94.9 03/19/2020 0630   CBG (last 3)  Recent Labs    03/22/20 1606 03/22/20 2100 03/23/20 0644  GLUCAP 335* 223* 156*    Assessment/Plan: S/P Procedure(s) (LRB): LEFT HEART CATH AND CORONARY ANGIOGRAPHY (N/A) CABG in am   LOS: 5 days    Jimmy Blankenship 03/23/2020

## 2020-03-23 NOTE — Progress Notes (Signed)
ANTICOAGULATION CONSULT NOTE  Pharmacy Consult for Heparin Indication: CAD awaiting CABG Scheduled 7/15  No Known Allergies  Patient Measurements: Height: 5\' 8"  (172.7 cm) Weight: 65.9 kg (145 lb 4.5 oz) IBW/kg (Calculated) : 68.4  Vital Signs: Temp: 98.7 F (37.1 C) (07/14 0755) Temp Source: Oral (07/14 0755) BP: 95/61 (07/14 0755) Pulse Rate: 96 (07/14 0755)  Labs: Recent Labs    03/21/20 0222 03/21/20 0222 03/22/20 0236 03/23/20 0318  HGB 8.8*   < > 8.5* 8.6*  HCT 27.9*  --  27.6* 27.7*  PLT 343  --  331 324  HEPARINUNFRC 0.41  --  0.41 0.37  CREATININE  --   --   --  1.22   < > = values in this interval not displayed.    Estimated Creatinine Clearance: 54.8 mL/min (by C-G formula based on SCr of 1.22 mg/dL).  Assessment: 54 yoM s/p cath with 3vCAD. CABG planned for Thursday.   Heparin level remains therapeutic at 0.37 on 650 units/hr, CBC low but stable.  Goal of Therapy:  Heparin level 0.3-0.7 units/ml Monitor platelets by anticoagulation protocol: Yes   Plan:  -Continue IV heparin at 650 units/hr. -Daily heparin level and CBC. -Discontinue Heparin as per CABG protocol.  Norina Buzzard, PharmD PGY1 Pharmacy Resident Phone: [phone] 03/23/2020 8:06 AM

## 2020-03-23 NOTE — Progress Notes (Signed)
Explained cardiac surgery book to patient and daughter. Patient understood it well and practice IS. He was walking in the room frequently, according to patient. Patient was concerning high glucose. Dr. Doristine Bosworth ordered lantus today and explained patient this. Patient understood MN NPO. HS [employer]

## 2020-03-23 NOTE — Progress Notes (Signed)
Checked on pt twice today for mobility. He was on phone earlier and now sleeping soundly, not arousing to touch or voice. Will let him sleep, asked RN to remind him to be up in room or hall this evening and also practice IS.  [place], ACSM 2:53 PM 03/23/2020

## 2020-03-23 NOTE — Progress Notes (Signed)
      [address]       [place] [address]             [phone]       Denies chest pain this morning. He knows he is scheduled for surgery tomorrow and does not have any questions at this time.  Nicholes Rough, PA-C

## 2020-03-23 NOTE — Progress Notes (Signed)
PROGRESS NOTE    Jimmy Blankenship  DQQ:[id number] DOB: [birth year] DOA: 03/16/2020 PCP: [name], MD   Brief Narrative: Jimmy Blankenship is a 69 y.o. [name] speaking  male with medical history significant of coronary artery disease disease status post stenting, diabetes type 2, hypertension, hyperlipidemia, normocytic anemia who presents to the emergency department at [facility] with complaints of chest pain.  He was sent from [facility] to [facility] for further evaluation due to typical symptoms.  Troponins were negative.  High suspicion for unstable angina.    Also noted to have significant normocytic anemia with positive FOBT.  He underwent cardiac cath with finding of severe triple-vessel disease.  Cardiothoracic surgery consulted for possible CABG and he is scheduled on 03/24/2020 as he has to be off of Plavix for 5 days.  Assessment & Plan:   Principal Problem:   Nonspecific chest pain Active Problems:   CAD (coronary artery disease)   DM2 (diabetes mellitus, type 2) (HCC)   HTN (hypertension)   HLD (hyperlipidemia)   Normocytic anemia   AKI (acute kidney injury) ([place])   Chest pain    Unstable angina/typical chest pain/CAD: His pain was associated with exertion and relieved with nitroglycerin. History of stent placement about 11 years  ago in [place].  Follows with cardiology at [facility].  Takes Plavix, metoprolol, Lipitor, losartan .  Cardiac cath done here which showed severe diffuse three-vessel coronary disease with decreased left ventricular function with ejection fraction of 40%. Cardiothoracic surgery consulted.  They plan to do CABG on 03/24/2020.  Plavix was discontinued 03/18/2020.  Aspirin added.  Echo shows decreased ejection fraction with hypokinesis/akinesis of distal inferior, mid/distal septal, distal lateral and apical walls.  EF 35%.  He remains on heparin and is symptom-free now.  Continue aspirin, atorvastatin, losartan and Toprol increased today to 50 mg  p.o. daily by cardiology.  Remains symptom-free.  AKI: His baseline creatinine ranges from 1.1-1.2.  Elevated creatinine on presentation.  Now resolved.  Normocytic anemia: Baseline hemoglobin in the range of 7.  No history of melena or hematochezia.  He takes Plavix.  Positive stool occult blood.  Low iron as per iron studies.  High suspicion for GI bleed.  He has an appointment with gastroenterology at [facility].  GI consulted here and recommended to follow-up with his gastroenterologist as an outpatient for further work-up, no EGD/Colo planned here.  Recommended to continue Protonix. S/P a dose of IV iron.  Hemoglobin dropped to 7.7 on 03/18/2020 and he received 1 unit of peritransplant.  Posttransfusion hemoglobin today is 9.9.  Essential hypertension: Currently blood pressure stable.  Continue current medications.  Monitor blood pressure  Hyperlipidemia: On Lipitor  Right lung upper lobe opacity: CT chest without contrast shows focal groundglass opacity in posterior right upper lobe.  This could be inflammatory or infectious and low-grade adenocarcinoma cannot definitely be excluded.  He has no leukocytosis or any respiratory symptoms and he is afebrile.  I doubt pneumonia.  Follow-up CT in 6 months is recommended.  Diabetes type 2: On glipizide and Metformin at home.  Blood sugar slightly elevated.  Increase Lantus to 15 units and continue SSI.      DVT prophylaxis: Heparin GTT Code Status: Full Family Communication: No family present at bedside.  Status is: Inpatient  Remains inpatient appropriate because:Ongoing diagnostic testing needed not appropriate for outpatient work up   Dispo: The patient is from: Home  Anticipated d/c is to: Home              Anticipated d/c date is: > 3 days, pending CABG scheduled for 03/24/2020.              Patient currently not stable for discharge.   Consultants: Cardiology, gastroenterology, cardiothoracic  surgery  Procedures:None  Antimicrobials:  Anti-infectives (From admission, onward)   None      Subjective: Patient seen and examined.  He has no complaints.  No chest pain.  Objective: Vitals:   03/22/20 2352 03/23/20 0400 03/23/20 0755 03/23/20 1038  BP: 104/62 104/68 95/61   Pulse: 88 80 96 91  Resp: 16 15 20 18   Temp: 98.9 F (37.2 C) 98.7 F (37.1 C) 98.7 F (37.1 C) 98 F (36.7 C)  TempSrc: Oral Oral Oral Oral  SpO2: 98% 96% 99%   Weight:      Height:        Intake/Output Summary (Last 24 hours) at 03/23/2020 1041 Last data filed at 03/23/2020 1038 Gross per 24 hour  Intake 154.42 ml  Output 2550 ml  Net -2395.58 ml   Filed Weights   03/15/20 2310 03/16/20 1628  Weight: 68 kg 65.9 kg    Examination:  General exam: Appears calm and comfortable  Respiratory system: Clear to auscultation. Respiratory effort normal. Cardiovascular system: S1 & S2 heard, RRR. No JVD, murmurs, rubs, gallops or clicks. No pedal edema. Gastrointestinal system: Abdomen is nondistended, soft and nontender. No organomegaly or masses felt. Normal bowel sounds heard. Central nervous system: Alert and oriented. No focal neurological deficits. Extremities: Symmetric 5 x 5 power. Skin: No rashes, lesions or ulcers.  Psychiatry: Judgement and insight appear normal. Mood & affect appropriate.     Data Reviewed: I have personally reviewed following labs and imaging studies  CBC: Recent Labs  Lab 03/17/20 0232 03/17/20 0232 03/18/20 1017 03/18/20 2015 03/19/20 0224 03/20/20 0146 03/21/20 0222 03/22/20 0236 03/23/20 0318  WBC 8.0   < > 5.3   < > 6.7 6.8 8.5 7.9 6.5  NEUTROABS 4.2  --  3.6  --   --   --   --   --   --   HGB 8.5*   < > 7.7*   < > 9.9* 8.9* 8.8* 8.5* 8.6*  HCT 28.1*   < > 24.6*   < > 31.5* 27.8* 27.9* 27.6* 27.7*  MCV 83.1   < > 83.1   < > 84.2 83.7 85.1 85.2 85.8  PLT 403*   < > 351   < > 378 342 343 331 324   < > = values in this interval not displayed.    Basic Metabolic Panel: Recent Labs  Lab 03/17/20 0232 03/18/20 1017 03/19/20 0224 03/23/20 0318  NA 137 137 137 137  K 4.3 3.9 4.0 4.5  CL 105 109 106 105  CO2 23 22 21* 22  GLUCOSE 106* 222* 166* 166*  BUN 24* 16 19 19   CREATININE 1.18 1.17 1.21 1.22  CALCIUM 8.9 8.0* 8.6* 8.6*   GFR: Estimated Creatinine Clearance: 54.8 mL/min (by C-G formula based on SCr of 1.22 mg/dL). Liver Function Tests: Recent Labs  Lab 03/19/20 0224 03/23/20 0318  AST 20 31  ALT 20 44  ALKPHOS 61 60  BILITOT 1.1 0.4  PROT 6.0* 5.7*  ALBUMIN 3.1* 2.8*   No results for input(s): LIPASE, AMYLASE in the last 168 hours. No results for input(s): AMMONIA in the last 168 hours. Coagulation Profile:  Recent Labs  Lab 03/19/20 0224  INR 1.1   Cardiac Enzymes: No results for input(s): CKTOTAL, CKMB, CKMBINDEX, TROPONINI in the last 168 hours. BNP (last 3 results) No results for input(s): PROBNP in the last 8760 hours. HbA1C: Recent Labs    03/21/20 0222  HGBA1C 6.4*   CBG: Recent Labs  Lab 03/22/20 1125 03/22/20 1602 03/22/20 1606 03/22/20 2100 03/23/20 0644  GLUCAP 221* 342* 335* 223* 156*   Lipid Profile: No results for input(s): CHOL, HDL, LDLCALC, TRIG, CHOLHDL, LDLDIRECT in the last 72 hours. Thyroid Function Tests: No results for input(s): TSH, T4TOTAL, FREET4, T3FREE, THYROIDAB in the last 72 hours. Anemia Panel: No results for input(s): VITAMINB12, FOLATE, FERRITIN, TIBC, IRON, RETICCTPCT in the last 72 hours. Sepsis Labs: No results for input(s): PROCALCITON, LATICACIDVEN in the last 168 hours.  Recent Results (from the past 240 hour(s))  SARS Coronavirus 2 by RT PCR (hospital order, performed in [facility] hospital lab) Nasopharyngeal Nasopharyngeal Swab     Status: None   Collection Time: 03/16/20  2:40 AM   Specimen: Nasopharyngeal Swab  Result Value Ref Range Status   SARS Coronavirus 2 NEGATIVE NEGATIVE Final    Comment: (NOTE) SARS-CoV-2 target nucleic acids  are NOT DETECTED.  The SARS-CoV-2 RNA is generally detectable in upper and lower respiratory specimens during the acute phase of infection. The lowest concentration of SARS-CoV-2 viral copies this assay can detect is 250 copies / mL. A negative result does not preclude SARS-CoV-2 infection and should not be used as the sole basis for treatment or other patient management decisions.  A negative result may occur with improper specimen collection / handling, submission of specimen other than nasopharyngeal swab, presence of viral mutation(s) within the areas targeted by this assay, and inadequate number of viral copies (<250 copies / mL). A negative result must be combined with clinical observations, patient history, and epidemiological information.  Fact Sheet for Patients:   StrictlyIdeas.no  Fact Sheet for Healthcare Providers: BankingDealers.co.za  This test is not yet approved or  cleared by the [place] FDA and has been authorized for detection and/or diagnosis of SARS-CoV-2 by FDA under an Emergency Use Authorization (EUA).  This EUA will remain in effect (meaning this test can be used) for the duration of the COVID-19 declaration under Section 564(b)(1) of the Act, 21 U.S.C. section 360bbb-3(b)(1), unless the authorization is terminated or revoked sooner.  Performed at [facility], [address] [place], [place] [address]   MRSA PCR Screening     Status: None   Collection Time: 03/16/20  5:00 PM   Specimen: Nasal Mucosa; Nasopharyngeal  Result Value Ref Range Status   MRSA by PCR NEGATIVE NEGATIVE Final    Comment:        The GeneXpert MRSA Assay (FDA approved for NASAL specimens only), is one component of a comprehensive MRSA colonization surveillance program. It is not intended to diagnose MRSA infection nor to guide or monitor treatment for MRSA infections. Performed at [facility], [place] [address], [place], [place] [address]   Surgical pcr screen     Status: None   Collection Time: 03/18/20  4:27 PM   Specimen: Nasal Mucosa; Nasal Swab  Result Value Ref Range Status   MRSA, PCR NEGATIVE NEGATIVE Final   Staphylococcus aureus NEGATIVE NEGATIVE Final    Comment: (NOTE) The Xpert SA Assay (FDA approved for NASAL specimens in patients 14 years of age and older), is one component of  a comprehensive surveillance program. It is not intended to diagnose infection nor to guide or monitor treatment. Performed at [facility], [place] [address] [place], [place] [address]          Radiology Studies: DG Chest 2 View  Result Date: 03/23/2020 CLINICAL DATA:  Chest pain. EXAM: CHEST - 2 VIEW COMPARISON:  March 15, 2020. FINDINGS: The heart size and mediastinal contours are within normal limits. Both lungs are clear. No pneumothorax or pleural effusion is noted. The visualized skeletal structures are unremarkable. IMPRESSION: No active cardiopulmonary disease. Electronically Signed   By: Marijo Conception M.D.   On: 03/23/2020 08:24        Scheduled Meds: . aspirin  81 mg Oral Daily  . atorvastatin  80 mg Oral Daily  . bisacodyl  5 mg Oral Once  . chlorhexidine  60 mL Topical Once   And  . [START ON 03/24/2020] chlorhexidine  60 mL Topical Once  . [START ON 03/24/2020] chlorhexidine  15 mL Mouth/Throat Once  . [START ON 03/24/2020] diazepam  2 mg Oral Once  . insulin aspart  0-5 Units Subcutaneous QHS  . insulin aspart  0-9 Units Subcutaneous TID WC  . insulin glargine  15 Units Subcutaneous Daily  . losartan  50 mg Oral Daily  . metoprolol succinate  50 mg Oral Daily  . [START ON 03/24/2020] metoprolol tartrate  12.5 mg Oral Once  . pantoprazole  40 mg Oral Daily  . sodium chloride flush  3 mL Intravenous Once  . sodium chloride flush  3 mL Intravenous Q12H   Continuous Infusions: . sodium chloride    . heparin 650 Units/hr (03/23/20 0300)     LOS: 5 days   Time  spent 23 minutes  Darliss Cheney, MD [facility] P7/14/2021, 10:41 AM

## 2020-03-24 ENCOUNTER — Inpatient Hospital Stay (HOSPITAL_COMMUNITY): Payer: Medicare Other | Admitting: Certified Registered"

## 2020-03-24 ENCOUNTER — Inpatient Hospital Stay (HOSPITAL_COMMUNITY): Payer: Medicare Other

## 2020-03-24 ENCOUNTER — Inpatient Hospital Stay (HOSPITAL_COMMUNITY)
Admission: EM | Disposition: A | Discharge status: Home / Self Care | Payer: Self-pay | Source: Home / Self Care | Attending: Cardiothoracic Surgery

## 2020-03-24 DIAGNOSIS — I25110 Atherosclerotic heart disease of native coronary artery with unstable angina pectoris: Secondary | ICD-10-CM

## 2020-03-24 DIAGNOSIS — Z951 Presence of aortocoronary bypass graft: Secondary | ICD-10-CM

## 2020-03-24 HISTORY — PX: TEE WITHOUT CARDIOVERSION: SHX5443

## 2020-03-24 HISTORY — PX: CORONARY ARTERY BYPASS GRAFT: SHX141

## 2020-03-24 LAB — BASIC METABOLIC PANEL
Anion gap: 10 (ref 5–15)
Anion gap: 9 (ref 5–15)
BUN: 22 mg/dL (ref 8–23)
BUN: 16 mg/dL (ref 8–23)
CO2: 24 mmol/L (ref 22–32)
CO2: 19 mmol/L — ABNORMAL LOW (ref 22–32)
Calcium: 8.8 mg/dL — ABNORMAL LOW (ref 8.9–10.3)
Calcium: 7.8 mg/dL — ABNORMAL LOW (ref 8.9–10.3)
Chloride: 105 mmol/L (ref 98–111)
Chloride: 109 mmol/L (ref 98–111)
Creatinine, Ser: 1.25 mg/dL — ABNORMAL HIGH (ref 0.61–1.24)
Creatinine, Ser: 1.02 mg/dL (ref 0.61–1.24)
GFR calc Af Amer: >60 mL/min (ref >60)
GFR calc Af Amer: >60 mL/min (ref >60)
GFR calc non Af Amer: 59 mL/min — ABNORMAL LOW (ref >60)
GFR calc non Af Amer: >60 mL/min (ref >60)
Glucose, Bld: 180 mg/dL — ABNORMAL HIGH (ref 70–99)
Glucose, Bld: 155 mg/dL — ABNORMAL HIGH (ref 70–99)
Potassium: 4.9 mmol/L (ref 3.5–5.1)
Potassium: 5 mmol/L (ref 3.5–5.1)
Sodium: 139 mmol/L (ref 135–145)
Sodium: 137 mmol/L (ref 135–145)

## 2020-03-24 LAB — POCT I-STAT 7, (LYTES, BLD GAS, ICA,H+H)
Acid-Base Excess: 1 mmol/L (ref 0.0–2.0)
Acid-Base Excess: 0 mmol/L (ref 0.0–2.0)
Acid-Base Excess: 0 mmol/L (ref 0.0–2.0)
Acid-base deficit: 1 mmol/L (ref 0.0–2.0)
Acid-base deficit: 2 mmol/L (ref 0.0–2.0)
Acid-base deficit: 3 mmol/L — ABNORMAL HIGH (ref 0.0–2.0)
Acid-base deficit: 4 mmol/L — ABNORMAL HIGH (ref 0.0–2.0)
Bicarbonate: 24 mmol/L (ref 20.0–28.0)
Bicarbonate: 25.1 mmol/L (ref 20.0–28.0)
Bicarbonate: 24.6 mmol/L (ref 20.0–28.0)
Bicarbonate: 23.5 mmol/L (ref 20.0–28.0)
Bicarbonate: 25.3 mmol/L (ref 20.0–28.0)
Bicarbonate: 21.6 mmol/L (ref 20.0–28.0)
Bicarbonate: 21.8 mmol/L (ref 20.0–28.0)
Calcium, Ion: 1.23 mmol/L (ref 1.15–1.40)
Calcium, Ion: 0.84 mmol/L — CL (ref 1.15–1.40)
Calcium, Ion: 1.03 mmol/L — ABNORMAL LOW (ref 1.15–1.40)
Calcium, Ion: 1.39 mmol/L (ref 1.15–1.40)
Calcium, Ion: 1.27 mmol/L (ref 1.15–1.40)
Calcium, Ion: 1.2 mmol/L (ref 1.15–1.40)
Calcium, Ion: 1.13 mmol/L — ABNORMAL LOW (ref 1.15–1.40)
HCT: 24 % — ABNORMAL LOW (ref 39.0–52.0)
HCT: 23 % — ABNORMAL LOW (ref 39.0–52.0)
HCT: 26 % — ABNORMAL LOW (ref 39.0–52.0)
HCT: 26 % — ABNORMAL LOW (ref 39.0–52.0)
HCT: 29 % — ABNORMAL LOW (ref 39.0–52.0)
HCT: 33 % — ABNORMAL LOW (ref 39.0–52.0)
HCT: 33 % — ABNORMAL LOW (ref 39.0–52.0)
Hemoglobin: 8.2 g/dL — ABNORMAL LOW (ref 13.0–17.0)
Hemoglobin: 7.8 g/dL — ABNORMAL LOW (ref 13.0–17.0)
Hemoglobin: 8.8 g/dL — ABNORMAL LOW (ref 13.0–17.0)
Hemoglobin: 8.8 g/dL — ABNORMAL LOW (ref 13.0–17.0)
Hemoglobin: 9.9 g/dL — ABNORMAL LOW (ref 13.0–17.0)
Hemoglobin: 11.2 g/dL — ABNORMAL LOW (ref 13.0–17.0)
Hemoglobin: 11.2 g/dL — ABNORMAL LOW (ref 13.0–17.0)
O2 Saturation: 100 %
O2 Saturation: 100 %
O2 Saturation: 100 %
O2 Saturation: 100 %
O2 Saturation: 98 %
O2 Saturation: 99 %
O2 Saturation: 98 %
Patient temperature: 35.6
Patient temperature: 37.8
Patient temperature: 37.6
Potassium: 3.7 mmol/L (ref 3.5–5.1)
Potassium: 4.3 mmol/L (ref 3.5–5.1)
Potassium: 4.3 mmol/L (ref 3.5–5.1)
Potassium: 3.9 mmol/L (ref 3.5–5.1)
Potassium: 3.7 mmol/L (ref 3.5–5.1)
Potassium: 5.1 mmol/L (ref 3.5–5.1)
Potassium: 5.1 mmol/L (ref 3.5–5.1)
Sodium: 139 mmol/L (ref 135–145)
Sodium: 142 mmol/L (ref 135–145)
Sodium: 138 mmol/L (ref 135–145)
Sodium: 141 mmol/L (ref 135–145)
Sodium: 141 mmol/L (ref 135–145)
Sodium: 139 mmol/L (ref 135–145)
Sodium: 139 mmol/L (ref 135–145)
TCO2: 25 mmol/L (ref 22–32)
TCO2: 26 mmol/L (ref 22–32)
TCO2: 26 mmol/L (ref 22–32)
TCO2: 25 mmol/L (ref 22–32)
TCO2: 27 mmol/L (ref 22–32)
TCO2: 23 mmol/L (ref 22–32)
TCO2: 23 mmol/L (ref 22–32)
pCO2 arterial: 43 mmHg (ref 32.0–48.0)
pCO2 arterial: 39 mmHg (ref 32.0–48.0)
pCO2 arterial: 37.5 mmHg (ref 32.0–48.0)
pCO2 arterial: 41.2 mmHg (ref 32.0–48.0)
pCO2 arterial: 40.5 mmHg (ref 32.0–48.0)
pCO2 arterial: 36.8 mmHg (ref 32.0–48.0)
pCO2 arterial: 41.4 mmHg (ref 32.0–48.0)
pH, Arterial: 7.355 (ref 7.350–7.450)
pH, Arterial: 7.418 (ref 7.350–7.450)
pH, Arterial: 7.424 (ref 7.350–7.450)
pH, Arterial: 7.364 (ref 7.350–7.450)
pH, Arterial: 7.397 (ref 7.350–7.450)
pH, Arterial: 7.379 (ref 7.350–7.450)
pH, Arterial: 7.333 — ABNORMAL LOW (ref 7.350–7.450)
pO2, Arterial: 541 mmHg — ABNORMAL HIGH (ref 83.0–108.0)
pO2, Arterial: 400 mmHg — ABNORMAL HIGH (ref 83.0–108.0)
pO2, Arterial: 346 mmHg — ABNORMAL HIGH (ref 83.0–108.0)
pO2, Arterial: 285 mmHg — ABNORMAL HIGH (ref 83.0–108.0)
pO2, Arterial: 103 mmHg (ref 83.0–108.0)
pO2, Arterial: 123 mmHg — ABNORMAL HIGH (ref 83.0–108.0)
pO2, Arterial: 118 mmHg — ABNORMAL HIGH (ref 83.0–108.0)

## 2020-03-24 LAB — POCT I-STAT, CHEM 8
BUN: 19 mg/dL (ref 8–23)
BUN: 19 mg/dL (ref 8–23)
BUN: 16 mg/dL (ref 8–23)
BUN: 17 mg/dL (ref 8–23)
BUN: 15 mg/dL (ref 8–23)
BUN: 16 mg/dL (ref 8–23)
Calcium, Ion: 1.19 mmol/L (ref 1.15–1.40)
Calcium, Ion: 1.21 mmol/L (ref 1.15–1.40)
Calcium, Ion: 1 mmol/L — ABNORMAL LOW (ref 1.15–1.40)
Calcium, Ion: 1.01 mmol/L — ABNORMAL LOW (ref 1.15–1.40)
Calcium, Ion: 1 mmol/L — ABNORMAL LOW (ref 1.15–1.40)
Calcium, Ion: 1.36 mmol/L (ref 1.15–1.40)
Chloride: 103 mmol/L (ref 98–111)
Chloride: 104 mmol/L (ref 98–111)
Chloride: 102 mmol/L (ref 98–111)
Chloride: 99 mmol/L (ref 98–111)
Chloride: 102 mmol/L (ref 98–111)
Chloride: 104 mmol/L (ref 98–111)
Creatinine, Ser: 0.9 mg/dL (ref 0.61–1.24)
Creatinine, Ser: 0.8 mg/dL (ref 0.61–1.24)
Creatinine, Ser: 0.7 mg/dL (ref 0.61–1.24)
Creatinine, Ser: 0.8 mg/dL (ref 0.61–1.24)
Creatinine, Ser: 0.7 mg/dL (ref 0.61–1.24)
Creatinine, Ser: 0.7 mg/dL (ref 0.61–1.24)
Glucose, Bld: 171 mg/dL — ABNORMAL HIGH (ref 70–99)
Glucose, Bld: 149 mg/dL — ABNORMAL HIGH (ref 70–99)
Glucose, Bld: 149 mg/dL — ABNORMAL HIGH (ref 70–99)
Glucose, Bld: 179 mg/dL — ABNORMAL HIGH (ref 70–99)
Glucose, Bld: 165 mg/dL — ABNORMAL HIGH (ref 70–99)
Glucose, Bld: 186 mg/dL — ABNORMAL HIGH (ref 70–99)
HCT: 24 % — ABNORMAL LOW (ref 39.0–52.0)
HCT: 24 % — ABNORMAL LOW (ref 39.0–52.0)
HCT: 23 % — ABNORMAL LOW (ref 39.0–52.0)
HCT: 23 % — ABNORMAL LOW (ref 39.0–52.0)
HCT: 26 % — ABNORMAL LOW (ref 39.0–52.0)
HCT: 28 % — ABNORMAL LOW (ref 39.0–52.0)
Hemoglobin: 8.2 g/dL — ABNORMAL LOW (ref 13.0–17.0)
Hemoglobin: 8.2 g/dL — ABNORMAL LOW (ref 13.0–17.0)
Hemoglobin: 7.8 g/dL — ABNORMAL LOW (ref 13.0–17.0)
Hemoglobin: 7.8 g/dL — ABNORMAL LOW (ref 13.0–17.0)
Hemoglobin: 8.8 g/dL — ABNORMAL LOW (ref 13.0–17.0)
Hemoglobin: 9.5 g/dL — ABNORMAL LOW (ref 13.0–17.0)
Potassium: 3.7 mmol/L (ref 3.5–5.1)
Potassium: 3.8 mmol/L (ref 3.5–5.1)
Potassium: 3.6 mmol/L (ref 3.5–5.1)
Potassium: 4.1 mmol/L (ref 3.5–5.1)
Potassium: 4 mmol/L (ref 3.5–5.1)
Potassium: 4.8 mmol/L (ref 3.5–5.1)
Sodium: 140 mmol/L (ref 135–145)
Sodium: 141 mmol/L (ref 135–145)
Sodium: 139 mmol/L (ref 135–145)
Sodium: 139 mmol/L (ref 135–145)
Sodium: 139 mmol/L (ref 135–145)
Sodium: 141 mmol/L (ref 135–145)
TCO2: 22 mmol/L (ref 22–32)
TCO2: 20 mmol/L — ABNORMAL LOW (ref 22–32)
TCO2: 24 mmol/L (ref 22–32)
TCO2: 26 mmol/L (ref 22–32)
TCO2: 22 mmol/L (ref 22–32)
TCO2: 23 mmol/L (ref 22–32)

## 2020-03-24 LAB — CBC
HCT: 28.8 % — ABNORMAL LOW (ref 39.0–52.0)
HCT: 29.4 % — ABNORMAL LOW (ref 39.0–52.0)
HCT: 31.6 % — ABNORMAL LOW (ref 39.0–52.0)
Hemoglobin: 8.9 g/dL — ABNORMAL LOW (ref 13.0–17.0)
Hemoglobin: 9.4 g/dL — ABNORMAL LOW (ref 13.0–17.0)
Hemoglobin: 10.2 g/dL — ABNORMAL LOW (ref 13.0–17.0)
MCH: 27 pg (ref 26.0–34.0)
MCH: 26.9 pg (ref 26.0–34.0)
MCH: 27.2 pg (ref 26.0–34.0)
MCHC: 30.9 g/dL (ref 30.0–36.0)
MCHC: 32 g/dL (ref 30.0–36.0)
MCHC: 32.3 g/dL (ref 30.0–36.0)
MCV: 87.3 fL (ref 80.0–100.0)
MCV: 84 fL (ref 80.0–100.0)
MCV: 84.3 fL (ref 80.0–100.0)
Platelets: 341 10*3/uL (ref 150–400)
Platelets: 119 10*3/uL — ABNORMAL LOW (ref 150–400)
Platelets: 168 10*3/uL (ref 150–400)
RBC: 3.3 MIL/uL — ABNORMAL LOW (ref 4.22–5.81)
RBC: 3.5 MIL/uL — ABNORMAL LOW (ref 4.22–5.81)
RBC: 3.75 MIL/uL — ABNORMAL LOW (ref 4.22–5.81)
RDW: 16.7 % — ABNORMAL HIGH (ref 11.5–15.5)
RDW: 16.2 % — ABNORMAL HIGH (ref 11.5–15.5)
RDW: 16.5 % — ABNORMAL HIGH (ref 11.5–15.5)
WBC: 7.1 10*3/uL (ref 4.0–10.5)
WBC: 9.6 10*3/uL (ref 4.0–10.5)
WBC: 11.4 10*3/uL — ABNORMAL HIGH (ref 4.0–10.5)
nRBC: 0 % (ref 0.0–0.2)
nRBC: 0 % (ref 0.0–0.2)
nRBC: 0 % (ref 0.0–0.2)

## 2020-03-24 LAB — GLUCOSE, CAPILLARY
Glucose-Capillary: 100 mg/dL — ABNORMAL HIGH (ref 70–99)
Glucose-Capillary: 104 mg/dL — ABNORMAL HIGH (ref 70–99)
Glucose-Capillary: 121 mg/dL — ABNORMAL HIGH (ref 70–99)
Glucose-Capillary: 122 mg/dL — ABNORMAL HIGH (ref 70–99)
Glucose-Capillary: 126 mg/dL — ABNORMAL HIGH (ref 70–99)
Glucose-Capillary: 128 mg/dL — ABNORMAL HIGH (ref 70–99)
Glucose-Capillary: 137 mg/dL — ABNORMAL HIGH (ref 70–99)
Glucose-Capillary: 150 mg/dL — ABNORMAL HIGH (ref 70–99)
Glucose-Capillary: 160 mg/dL — ABNORMAL HIGH (ref 70–99)
Glucose-Capillary: 161 mg/dL — ABNORMAL HIGH (ref 70–99)
Glucose-Capillary: 164 mg/dL — ABNORMAL HIGH (ref 70–99)
Glucose-Capillary: 94 mg/dL (ref 70–99)

## 2020-03-24 LAB — HEMOGLOBIN AND HEMATOCRIT, BLOOD
HCT: 24.8 % — ABNORMAL LOW (ref 39.0–52.0)
Hemoglobin: 7.8 g/dL — ABNORMAL LOW (ref 13.0–17.0)

## 2020-03-24 LAB — MAGNESIUM: Magnesium: 2.5 mg/dL — ABNORMAL HIGH (ref 1.7–2.4)

## 2020-03-24 LAB — PREPARE RBC (CROSSMATCH)

## 2020-03-24 LAB — ECHO INTRAOPERATIVE TEE
Height: 68 in
MV M vel: 4.62 m/s
MV Peak grad: 85.4 mmHg
Radius: 0.4 cm
S' Lateral: 3.61 cm
Weight: 2303.37 oz

## 2020-03-24 LAB — PROTIME-INR
INR: 1.4 — ABNORMAL HIGH (ref 0.8–1.2)
Prothrombin Time: 16.2 seconds — ABNORMAL HIGH (ref 11.4–15.2)

## 2020-03-24 LAB — PLATELET COUNT: Platelets: 170 10*3/uL (ref 150–400)

## 2020-03-24 LAB — APTT: aPTT: 32 seconds (ref 24–36)

## 2020-03-24 LAB — HEPARIN LEVEL (UNFRACTIONATED): Heparin Unfractionated: 0.46 IU/mL (ref 0.30–0.70)

## 2020-03-24 IMAGING — CR DG CHEST 1V PORT
1 series · 1 of 1 positions shown · non-contrast
Comparison: [DATE]

CLINICAL DATA: CABG, intubated

EXAM:
PORTABLE CHEST 1 VIEW

[AP]
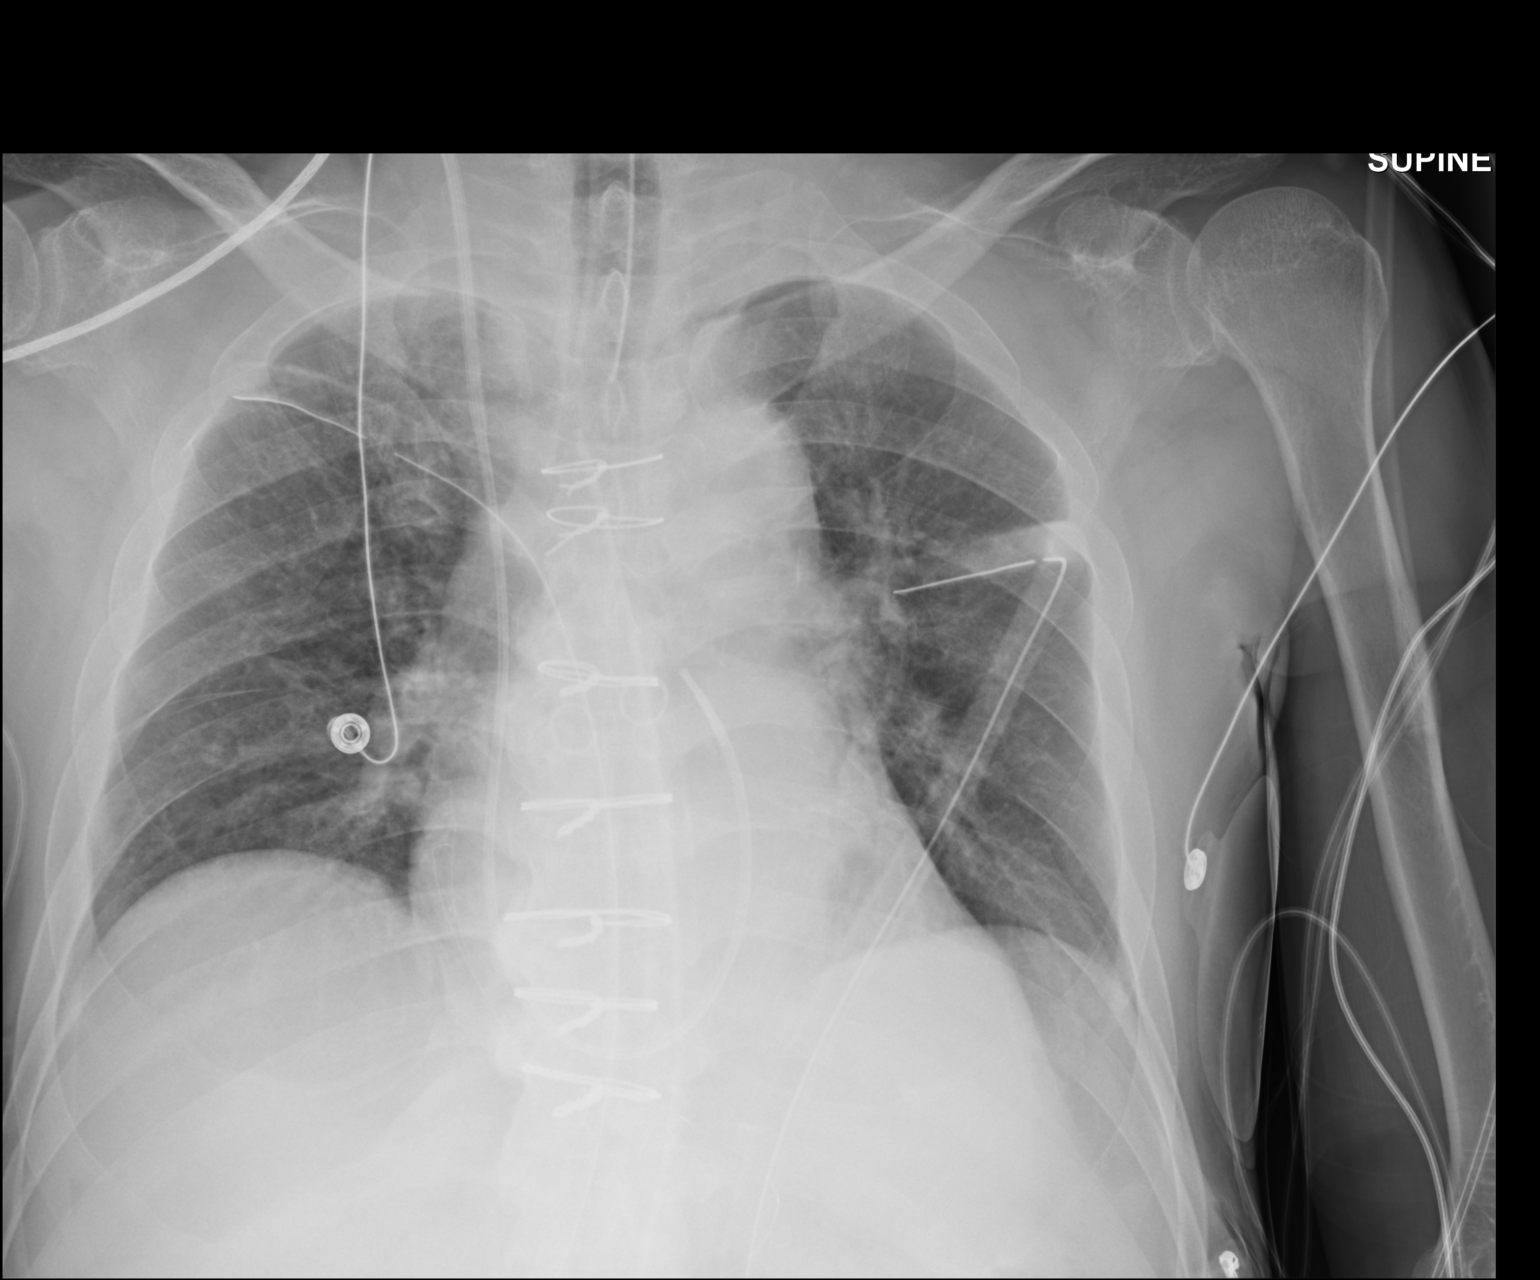

[1 of 1 positions shown; findings below may reference images not displayed]

FINDINGS: Single frontal view of the chest demonstrates endotracheal tube
overlying tracheal air column tip at level of thoracic inlet.
Mediastinal drain and bilateral chest tubes are identified. There is
a right internal jugular central venous catheter tip overlying the
main pulmonary outflow tract. Epicardial pacing wires are also seen.

The cardiac silhouette is unremarkable. Lung volumes are diminished
with crowding the central vasculature. No airspace disease,
effusion, or pneumothorax. Postsurgical changes are seen from median
sternotomy.
IMPRESSION: 1. Support devices as above.
2. Low lung volumes, no acute intrathoracic process.

## 2020-03-24 SURGERY — CORONARY ARTERY BYPASS GRAFTING (CABG)
Anesthesia: General | Site: Chest

## 2020-03-24 MED ORDER — SODIUM CHLORIDE 0.9% FLUSH: 10.0000 mL | INTRAVENOUS | Status: DC | PRN

## 2020-03-24 MED ORDER — ALBUMIN HUMAN 5 % IV SOLN
250.0000 mL | INTRAVENOUS | Status: AC | PRN
  Administered 2020-03-24 (×4): 12.5 g via INTRAVENOUS
  Filled 2020-03-24 (×2): qty 250

## 2020-03-24 MED ORDER — 0.9 % SODIUM CHLORIDE (POUR BTL) OPTIME
TOPICAL | Status: DC | PRN
  Administered 2020-03-24: 5000 mL

## 2020-03-24 MED ORDER — PHENYLEPHRINE 40 MCG/ML (10ML) SYRINGE FOR IV PUSH (FOR BLOOD PRESSURE SUPPORT)
PREFILLED_SYRINGE | INTRAVENOUS | Status: DC | PRN
  Administered 2020-03-24: 120 ug via INTRAVENOUS
  Administered 2020-03-24: 80 ug via INTRAVENOUS

## 2020-03-24 MED ORDER — MIDAZOLAM HCL 5 MG/5ML IJ SOLN
INTRAMUSCULAR | Status: DC | PRN
  Administered 2020-03-24: 2 mg via INTRAVENOUS
  Administered 2020-03-24: 3 mg via INTRAVENOUS
  Administered 2020-03-24: 2 mg via INTRAVENOUS
  Administered 2020-03-24: 1 mg via INTRAVENOUS
  Administered 2020-03-24: 2 mg via INTRAVENOUS

## 2020-03-24 MED ORDER — DOCUSATE SODIUM 100 MG PO CAPS
200.0000 mg | ORAL_CAPSULE | Freq: Every day | ORAL | Status: DC
  Administered 2020-03-25 – 2020-03-30 (×6): 200 mg via ORAL
  Filled 2020-03-24 (×6): qty 2

## 2020-03-24 MED ORDER — SODIUM CHLORIDE (PF) 0.9 % IJ SOLN
OROMUCOSAL | Status: DC | PRN
  Administered 2020-03-24 (×3): 4 mL via TOPICAL

## 2020-03-24 MED ORDER — SODIUM CHLORIDE 0.9% FLUSH
3.0000 mL | Freq: Two times a day (BID) | INTRAVENOUS | Status: DC
  Administered 2020-03-25 – 2020-03-30 (×8): 3 mL via INTRAVENOUS

## 2020-03-24 MED ORDER — ACETAMINOPHEN 650 MG RE SUPP
650.0000 mg | Freq: Once | RECTAL | Status: AC
  Administered 2020-03-24: 650 mg via RECTAL

## 2020-03-24 MED ORDER — ACETAMINOPHEN 160 MG/5ML PO SOLN: 1000.0000 mg | Freq: Four times a day (QID) | ORAL | Status: DC

## 2020-03-24 MED ORDER — SODIUM CHLORIDE 0.9 % IV SOLN: 250.0000 mL | INTRAVENOUS | Status: DC

## 2020-03-24 MED ORDER — HEPARIN SODIUM (PORCINE) 1000 UNIT/ML IJ SOLN
INTRAMUSCULAR | Status: DC | PRN
  Administered 2020-03-24: 21000 [IU] via INTRAVENOUS
  Administered 2020-03-24: 2000 [IU] via INTRAVENOUS

## 2020-03-24 MED ORDER — POTASSIUM CHLORIDE 10 MEQ/50ML IV SOLN
10.0000 meq | INTRAVENOUS | Status: AC
  Administered 2020-03-24 (×3): 10 meq via INTRAVENOUS

## 2020-03-24 MED ORDER — FAMOTIDINE IN NACL 20-0.9 MG/50ML-% IV SOLN
20.0000 mg | Freq: Two times a day (BID) | INTRAVENOUS | Status: DC
  Administered 2020-03-24: 20 mg via INTRAVENOUS

## 2020-03-24 MED ORDER — VANCOMYCIN HCL IN DEXTROSE 1-5 GM/200ML-% IV SOLN
1000.0000 mg | Freq: Once | INTRAVENOUS | Status: AC
  Administered 2020-03-24: 1000 mg via INTRAVENOUS
  Filled 2020-03-24: qty 200

## 2020-03-24 MED ORDER — LACTATED RINGERS IV SOLN
INTRAVENOUS | Status: DC | PRN
Start: 2020-03-24 — End: 2020-03-24

## 2020-03-24 MED ORDER — METOPROLOL TARTRATE 25 MG/10 ML ORAL SUSPENSION: 12.5000 mg | Freq: Two times a day (BID) | ORAL | Status: DC

## 2020-03-24 MED ORDER — ACETAMINOPHEN 500 MG PO TABS
1000.0000 mg | ORAL_TABLET | Freq: Four times a day (QID) | ORAL | Status: AC
  Administered 2020-03-25 – 2020-03-29 (×17): 1000 mg via ORAL
  Filled 2020-03-24 (×19): qty 2

## 2020-03-24 MED ORDER — NITROGLYCERIN IN D5W 200-5 MCG/ML-% IV SOLN: 0.0000 ug/min | INTRAVENOUS | Status: DC

## 2020-03-24 MED ORDER — SODIUM CHLORIDE 0.9% IV SOLUTION: Freq: Once | INTRAVENOUS | Status: DC

## 2020-03-24 MED ORDER — PROPOFOL 10 MG/ML IV BOLUS
INTRAVENOUS | Status: AC
  Filled 2020-03-24: qty 20

## 2020-03-24 MED ORDER — NOREPINEPHRINE 4 MG/250ML-% IV SOLN: 0.0000 ug/min | INTRAVENOUS | Status: DC

## 2020-03-24 MED ORDER — PLASMA-LYTE 148 IV SOLN
INTRAVENOUS | Status: DC | PRN
  Administered 2020-03-24: 500 mL

## 2020-03-24 MED ORDER — SODIUM CHLORIDE 0.9 % IV SOLN
1.5000 g | Freq: Two times a day (BID) | INTRAVENOUS | Status: AC
  Administered 2020-03-24 – 2020-03-26 (×4): 1.5 g via INTRAVENOUS
  Filled 2020-03-24 (×4): qty 1.5

## 2020-03-24 MED ORDER — ASPIRIN EC 325 MG PO TBEC
325.0000 mg | DELAYED_RELEASE_TABLET | Freq: Every day | ORAL | Status: DC
  Administered 2020-03-25 – 2020-03-30 (×6): 325 mg via ORAL
  Filled 2020-03-24 (×6): qty 1

## 2020-03-24 MED ORDER — PROPOFOL 10 MG/ML IV BOLUS
INTRAVENOUS | Status: DC | PRN
  Administered 2020-03-24: 50 mg via INTRAVENOUS

## 2020-03-24 MED ORDER — METOPROLOL TARTRATE 5 MG/5ML IV SOLN
2.5000 mg | INTRAVENOUS | Status: DC | PRN
  Administered 2020-03-25: 5 mg via INTRAVENOUS
  Filled 2020-03-24: qty 5

## 2020-03-24 MED ORDER — CHLORHEXIDINE GLUCONATE CLOTH 2 % EX PADS
6.0000 | MEDICATED_PAD | Freq: Every day | CUTANEOUS | Status: DC
  Administered 2020-03-24 – 2020-03-28 (×5): 6 via TOPICAL

## 2020-03-24 MED ORDER — SODIUM CHLORIDE 0.9 % IV SOLN: INTRAVENOUS | Status: DC

## 2020-03-24 MED ORDER — DEXTROSE 50 % IV SOLN: 0.0000 mL | INTRAVENOUS | Status: DC | PRN

## 2020-03-24 MED ORDER — PHENYLEPHRINE HCL-NACL 20-0.9 MG/250ML-% IV SOLN: 0.0000 ug/min | INTRAVENOUS | Status: DC

## 2020-03-24 MED ORDER — FENTANYL CITRATE (PF) 250 MCG/5ML IJ SOLN
INTRAMUSCULAR | Status: AC
  Filled 2020-03-24: qty 25

## 2020-03-24 MED ORDER — ROCURONIUM BROMIDE 10 MG/ML (PF) SYRINGE
PREFILLED_SYRINGE | INTRAVENOUS | Status: DC | PRN
  Administered 2020-03-24 (×2): 50 mg via INTRAVENOUS
  Administered 2020-03-24: 70 mg via INTRAVENOUS
  Administered 2020-03-24: 30 mg via INTRAVENOUS
  Administered 2020-03-24: 50 mg via INTRAVENOUS

## 2020-03-24 MED ORDER — INSULIN REGULAR(HUMAN) IN NACL 100-0.9 UT/100ML-% IV SOLN: INTRAVENOUS | Status: DC

## 2020-03-24 MED ORDER — PROTAMINE SULFATE 10 MG/ML IV SOLN
INTRAVENOUS | Status: DC | PRN
  Administered 2020-03-24: 50 mg via INTRAVENOUS
  Administered 2020-03-24: 10 mg via INTRAVENOUS
  Administered 2020-03-24: 20 mg via INTRAVENOUS
  Administered 2020-03-24: 60 mg via INTRAVENOUS
  Administered 2020-03-24: 20 mg via INTRAVENOUS
  Administered 2020-03-24: 50 mg via INTRAVENOUS
  Administered 2020-03-24: 20 mg via INTRAVENOUS

## 2020-03-24 MED ORDER — LACTATED RINGERS IV SOLN: INTRAVENOUS | Status: DC

## 2020-03-24 MED ORDER — SODIUM CHLORIDE 0.9 % IV SOLN: INTRAVENOUS | Status: DC | PRN

## 2020-03-24 MED ORDER — MAGNESIUM SULFATE 4 GM/100ML IV SOLN
4.0000 g | Freq: Once | INTRAVENOUS | Status: AC
  Administered 2020-03-24: 4 g via INTRAVENOUS
  Filled 2020-03-24: qty 100

## 2020-03-24 MED ORDER — ONDANSETRON HCL 4 MG/2ML IJ SOLN: 4.0000 mg | Freq: Four times a day (QID) | INTRAMUSCULAR | Status: DC | PRN

## 2020-03-24 MED ORDER — DEXMEDETOMIDINE HCL IN NACL 400 MCG/100ML IV SOLN
0.0000 ug/kg/h | INTRAVENOUS | Status: DC
  Filled 2020-03-24: qty 100

## 2020-03-24 MED ORDER — CHLORHEXIDINE GLUCONATE 0.12 % MT SOLN
15.0000 mL | OROMUCOSAL | Status: AC
  Administered 2020-03-24: 15 mL via OROMUCOSAL

## 2020-03-24 MED ORDER — PANTOPRAZOLE SODIUM 40 MG PO TBEC
40.0000 mg | DELAYED_RELEASE_TABLET | Freq: Every day | ORAL | Status: DC
  Administered 2020-03-26 – 2020-03-30 (×5): 40 mg via ORAL
  Filled 2020-03-24 (×5): qty 1

## 2020-03-24 MED ORDER — BISACODYL 5 MG PO TBEC
10.0000 mg | DELAYED_RELEASE_TABLET | Freq: Every day | ORAL | Status: DC
  Administered 2020-03-25 – 2020-03-30 (×6): 10 mg via ORAL
  Filled 2020-03-24 (×6): qty 2

## 2020-03-24 MED ORDER — SODIUM CHLORIDE 0.9 % IV SOLN
20.0000 ug | Freq: Once | INTRAVENOUS | Status: AC
  Administered 2020-03-24: 20 ug via INTRAVENOUS
  Filled 2020-03-24: qty 5

## 2020-03-24 MED ORDER — LACTATED RINGERS IV SOLN: 500.0000 mL | Freq: Once | INTRAVENOUS | Status: DC | PRN

## 2020-03-24 MED ORDER — FENTANYL CITRATE (PF) 250 MCG/5ML IJ SOLN
INTRAMUSCULAR | Status: DC | PRN
  Administered 2020-03-24: 100 ug via INTRAVENOUS
  Administered 2020-03-24: 200 ug via INTRAVENOUS
  Administered 2020-03-24: 50 ug via INTRAVENOUS
  Administered 2020-03-24: 200 ug via INTRAVENOUS
  Administered 2020-03-24: 700 ug via INTRAVENOUS

## 2020-03-24 MED ORDER — ACETAMINOPHEN 160 MG/5ML PO SOLN: 650.0000 mg | Freq: Once | ORAL | Status: AC

## 2020-03-24 MED ORDER — SODIUM CHLORIDE 0.45 % IV SOLN: INTRAVENOUS | Status: DC | PRN

## 2020-03-24 MED ORDER — MIDAZOLAM HCL 2 MG/2ML IJ SOLN: 2.0000 mg | INTRAMUSCULAR | Status: DC | PRN

## 2020-03-24 MED ORDER — METOPROLOL TARTRATE 12.5 MG HALF TABLET
12.5000 mg | ORAL_TABLET | Freq: Two times a day (BID) | ORAL | Status: DC
  Administered 2020-03-25 – 2020-03-30 (×9): 12.5 mg via ORAL
  Filled 2020-03-24 (×10): qty 1

## 2020-03-24 MED ORDER — SODIUM CHLORIDE 0.9% FLUSH
10.0000 mL | Freq: Two times a day (BID) | INTRAVENOUS | Status: DC
  Administered 2020-03-24 – 2020-03-30 (×8): 10 mL

## 2020-03-24 MED ORDER — MIDAZOLAM HCL (PF) 10 MG/2ML IJ SOLN
INTRAMUSCULAR | Status: AC
  Filled 2020-03-24: qty 2

## 2020-03-24 MED ORDER — ASPIRIN 81 MG PO CHEW: 324.0000 mg | CHEWABLE_TABLET | Freq: Every day | ORAL | Status: DC

## 2020-03-24 MED ORDER — MILRINONE LACTATE IN DEXTROSE 20-5 MG/100ML-% IV SOLN
0.0000 ug/kg/min | INTRAVENOUS | Status: DC
  Administered 2020-03-25 – 2020-03-26 (×3): 0.25 ug/kg/min via INTRAVENOUS
  Filled 2020-03-24 (×2): qty 100

## 2020-03-24 MED ORDER — BISACODYL 10 MG RE SUPP: 10.0000 mg | Freq: Every day | RECTAL | Status: DC

## 2020-03-24 MED ORDER — OXYCODONE HCL 5 MG PO TABS
5.0000 mg | ORAL_TABLET | ORAL | Status: DC | PRN
  Administered 2020-03-24 – 2020-03-30 (×16): 10 mg via ORAL
  Filled 2020-03-24 (×16): qty 2

## 2020-03-24 MED ORDER — HEMOSTATIC AGENTS (NO CHARGE) OPTIME
TOPICAL | Status: DC | PRN
  Administered 2020-03-24 (×2): 1 via TOPICAL

## 2020-03-24 MED ORDER — MORPHINE SULFATE (PF) 2 MG/ML IV SOLN
1.0000 mg | INTRAVENOUS | Status: DC | PRN
  Administered 2020-03-24: 2 mg via INTRAVENOUS
  Administered 2020-03-24: 4 mg via INTRAVENOUS
  Administered 2020-03-24 (×2): 2 mg via INTRAVENOUS
  Administered 2020-03-25: 4 mg via INTRAVENOUS
  Administered 2020-03-25 (×2): 2 mg via INTRAVENOUS
  Filled 2020-03-24: qty 1
  Filled 2020-03-24: qty 2
  Filled 2020-03-24 (×4): qty 1
  Filled 2020-03-24: qty 2

## 2020-03-24 MED ORDER — SODIUM CHLORIDE 0.9% FLUSH: 3.0000 mL | INTRAVENOUS | Status: DC | PRN

## 2020-03-24 MED ORDER — TRAMADOL HCL 50 MG PO TABS
50.0000 mg | ORAL_TABLET | ORAL | Status: DC | PRN
  Administered 2020-03-24 – 2020-03-25 (×2): 100 mg via ORAL
  Administered 2020-03-26: 50 mg via ORAL
  Filled 2020-03-24: qty 2
  Filled 2020-03-24: qty 1
  Filled 2020-03-24: qty 2

## 2020-03-24 SURGICAL SUPPLY — 95 items
ADAPTER CARDIO PERF ANTE/RETRO (ADAPTER) ×4 IMPLANT
BAG DECANTER FOR FLEXI CONT (MISCELLANEOUS) ×4 IMPLANT
BLADE CLIPPER SURG (BLADE) IMPLANT
BLADE STERNUM SYSTEM 6 (BLADE) ×4 IMPLANT
BLADE SURG 12 STRL SS (BLADE) ×4 IMPLANT
BNDG ELASTIC 4X5.8 VLCR STR LF (GAUZE/BANDAGES/DRESSINGS) ×4 IMPLANT
BNDG ELASTIC 6X5.8 VLCR STR LF (GAUZE/BANDAGES/DRESSINGS) ×4 IMPLANT
BNDG GAUZE ELAST 4 BULKY (GAUZE/BANDAGES/DRESSINGS) ×4 IMPLANT
CANISTER SUCT 3000ML PPV (MISCELLANEOUS) ×4 IMPLANT
CANNULA GUNDRY RCSP 15FR (MISCELLANEOUS) ×4 IMPLANT
CATH CPB KIT VANTRIGT (MISCELLANEOUS) ×4 IMPLANT
CATH ROBINSON RED A/P 18FR (CATHETERS) ×12 IMPLANT
CATH THORACIC 28FR RT ANG (CATHETERS) ×8 IMPLANT
CLIP VESOCCLUDE SM WIDE 24/CT (CLIP) ×4 IMPLANT
DERMABOND ADVANCED (GAUZE/BANDAGES/DRESSINGS) ×2
DERMABOND ADVANCED .7 DNX12 (GAUZE/BANDAGES/DRESSINGS) ×2 IMPLANT
DRAIN CHANNEL 32F RND 10.7 FF (WOUND CARE) ×4 IMPLANT
DRAPE CARDIOVASCULAR INCISE (DRAPES) ×4
DRAPE SLUSH/WARMER DISC (DRAPES) ×4 IMPLANT
DRAPE SRG 135X102X78XABS (DRAPES) ×2 IMPLANT
DRSG AQUACEL AG ADV 3.5X14 (GAUZE/BANDAGES/DRESSINGS) ×4 IMPLANT
DRSG COVADERM 4X14 (GAUZE/BANDAGES/DRESSINGS) ×4 IMPLANT
ELECT BLADE 4.0 EZ CLEAN MEGAD (MISCELLANEOUS) ×4
ELECT BLADE 6.5 EXT (BLADE) ×4 IMPLANT
ELECT CAUTERY BLADE 6.4 (BLADE) ×4 IMPLANT
ELECT REM PT RETURN 9FT ADLT (ELECTROSURGICAL) ×8
ELECTRODE BLDE 4.0 EZ CLN MEGD (MISCELLANEOUS) ×2 IMPLANT
ELECTRODE REM PT RTRN 9FT ADLT (ELECTROSURGICAL) ×4 IMPLANT
FELT TEFLON 1X6 (MISCELLANEOUS) ×4 IMPLANT
GAUZE SPONGE 4X4 12PLY STRL (GAUZE/BANDAGES/DRESSINGS) ×8 IMPLANT
GAUZE SPONGE 4X4 12PLY STRL LF (GAUZE/BANDAGES/DRESSINGS) ×8 IMPLANT
GAUZE XEROFORM 1X8 LF (GAUZE/BANDAGES/DRESSINGS) ×4 IMPLANT
GLOVE BIO SURGEON STRL SZ 6.5 (GLOVE) ×18 IMPLANT
GLOVE BIO SURGEON STRL SZ7.5 (GLOVE) ×16 IMPLANT
GLOVE BIO SURGEONS STRL SZ 6.5 (GLOVE) ×6
GOWN STRL REUS W/ TWL LRG LVL3 (GOWN DISPOSABLE) ×8 IMPLANT
GOWN STRL REUS W/TWL LRG LVL3 (GOWN DISPOSABLE) ×16
HEMOSTAT POWDER SURGIFOAM 1G (HEMOSTASIS) ×12 IMPLANT
HEMOSTAT SURGICEL 2X14 (HEMOSTASIS) ×4 IMPLANT
INSERT FOGARTY XLG (MISCELLANEOUS) IMPLANT
KIT BASIN OR (CUSTOM PROCEDURE TRAY) ×4 IMPLANT
KIT SUCTION CATH 14FR (SUCTIONS) ×4 IMPLANT
KIT TURNOVER KIT B (KITS) ×4 IMPLANT
KIT VASOVIEW HEMOPRO 2 VH 4000 (KITS) ×4 IMPLANT
LEAD PACING MYOCARDI (MISCELLANEOUS) ×4 IMPLANT
MARKER GRAFT CORONARY BYPASS (MISCELLANEOUS) ×12 IMPLANT
NS IRRIG 1000ML POUR BTL (IV SOLUTION) ×20 IMPLANT
PACK E OPEN HEART (SUTURE) ×4 IMPLANT
PACK OPEN HEART (CUSTOM PROCEDURE TRAY) ×4 IMPLANT
PAD ARMBOARD 7.5X6 YLW CONV (MISCELLANEOUS) ×8 IMPLANT
PAD ELECT DEFIB RADIOL ZOLL (MISCELLANEOUS) ×4 IMPLANT
PENCIL BUTTON HOLSTER BLD 10FT (ELECTRODE) ×4 IMPLANT
POSITIONER HEAD DONUT 9IN (MISCELLANEOUS) ×4 IMPLANT
PUNCH AORTIC ROTATE 4.0MM (MISCELLANEOUS) ×4 IMPLANT
PUNCH AORTIC ROTATE 4.5MM 8IN (MISCELLANEOUS) IMPLANT
PUNCH AORTIC ROTATE 5MM 8IN (MISCELLANEOUS) IMPLANT
SET CARDIOPLEGIA MPS 5001102 (MISCELLANEOUS) ×4 IMPLANT
SPONGE LAP 18X18 RF (DISPOSABLE) ×4 IMPLANT
SPONGE LAP 4X18 RFD (DISPOSABLE) ×4 IMPLANT
SUPPORT HEART JANKE-BARRON (MISCELLANEOUS) ×4 IMPLANT
SURGIFLO W/THROMBIN 8M KIT (HEMOSTASIS) ×4 IMPLANT
SUT BONE WAX W31G (SUTURE) ×4 IMPLANT
SUT ETHILON 4 0 PS 2 18 (SUTURE) ×4 IMPLANT
SUT MNCRL AB 4-0 PS2 18 (SUTURE) ×4 IMPLANT
SUT PROLENE 3 0 SH DA (SUTURE) IMPLANT
SUT PROLENE 3 0 SH1 36 (SUTURE) IMPLANT
SUT PROLENE 4 0 RB 1 (SUTURE) ×16
SUT PROLENE 4 0 SH DA (SUTURE) ×4 IMPLANT
SUT PROLENE 4-0 RB1 .5 CRCL 36 (SUTURE) ×8 IMPLANT
SUT PROLENE 5 0 C 1 36 (SUTURE) ×12 IMPLANT
SUT PROLENE 6 0 C 1 30 (SUTURE) ×4 IMPLANT
SUT PROLENE 6 0 CC (SUTURE) ×16 IMPLANT
SUT PROLENE 7 0 BV1 MDA (SUTURE) ×4 IMPLANT
SUT PROLENE 8 0 BV175 6 (SUTURE) ×8 IMPLANT
SUT PROLENE BLUE 7 0 (SUTURE) ×4 IMPLANT
SUT SILK  1 MH (SUTURE)
SUT SILK 1 MH (SUTURE) IMPLANT
SUT SILK 2 0 SH CR/8 (SUTURE) IMPLANT
SUT SILK 3 0 SH CR/8 (SUTURE) IMPLANT
SUT STEEL 6MS V (SUTURE) ×4 IMPLANT
SUT STEEL SZ 6 DBL 3X14 BALL (SUTURE) ×8 IMPLANT
SUT VIC AB 1 CTX 36 (SUTURE) ×8
SUT VIC AB 1 CTX36XBRD ANBCTR (SUTURE) ×4 IMPLANT
SUT VIC AB 2-0 CT1 27 (SUTURE) ×4
SUT VIC AB 2-0 CT1 TAPERPNT 27 (SUTURE) ×2 IMPLANT
SUT VIC AB 2-0 CTX 27 (SUTURE) IMPLANT
SUT VIC AB 3-0 SH 8-18 (SUTURE) ×4 IMPLANT
SUT VIC AB 3-0 X1 27 (SUTURE) IMPLANT
SYSTEM SAHARA CHEST DRAIN ATS (WOUND CARE) ×4 IMPLANT
TOWEL GREEN STERILE (TOWEL DISPOSABLE) ×4 IMPLANT
TOWEL GREEN STERILE FF (TOWEL DISPOSABLE) ×4 IMPLANT
TRAY FOLEY SLVR 16FR TEMP STAT (SET/KITS/TRAYS/PACK) ×4 IMPLANT
TUBING LAP HI FLOW INSUFFLATIO (TUBING) ×4 IMPLANT
UNDERPAD 30X36 HEAVY ABSORB (UNDERPADS AND DIAPERS) ×4 IMPLANT
WATER STERILE IRR 1000ML POUR (IV SOLUTION) ×8 IMPLANT

## 2020-03-24 NOTE — Anesthesia Procedure Notes (Signed)
Arterial Line Insertion Start/End7/15/2021 7:00 AM, 03/24/2020 7:05 AM Performed by: Lillia Abed, MD, Griffin Dakin, CRNA, CRNA  Patient location: Pre-op. Preanesthetic checklist: patient identified, IV checked, site marked, risks and benefits discussed, surgical consent, monitors and equipment checked, pre-op evaluation, timeout performed and anesthesia consent Lidocaine 1% used for infiltration Right, radial was placed Catheter size: 20 Fr Hand hygiene performed  and maximum sterile barriers used   Attempts: 1 Procedure performed without using ultrasound guided technique. Following insertion, dressing applied and Biopatch. Post procedure assessment: normal and unchanged  Patient tolerated the procedure well with no immediate complications.

## 2020-03-24 NOTE — Transfer of Care (Signed)
Immediate Anesthesia Transfer of Care Note  Patient: Jimmy Blankenship  Procedure(s) Performed: CORONARY ARTERY BYPASS GRAFTING (CABG), ON PUMP, TIMES THREE, USING LEFT INTERNAL MAMMARY ARTERY AND RIGHT ENDOSCOPICALLY HARVESTED GREATER SAPHENOUS VEIN (N/A Chest) TRANSESOPHAGEAL ECHOCARDIOGRAM (TEE) (N/A )  Patient Location: ICU and SICU  Anesthesia Type:General  Level of Consciousness: Patient remains intubated per anesthesia plan  Airway & Oxygen Therapy: Patient remains intubated per anesthesia plan and Patient placed on Ventilator (see vital sign flow sheet for setting)  Post-op Assessment: Report given to RN and Post -op Vital signs reviewed and stable  Post vital signs: Reviewed and stable  Last Vitals:  Vitals Value Taken Time  BP    Temp    Pulse    Resp    SpO2      Last Pain:  Vitals:   03/24/20 0420  TempSrc: Oral  PainSc: 0-No pain         Complications: No complications documented.

## 2020-03-24 NOTE — Brief Op Note (Signed)
03/16/2020 - 03/24/2020  12:06 PM  PATIENT:  Jimmy Blankenship  69 y.o. male  PRE-OPERATIVE DIAGNOSIS:  Coronary Artery Disease  POST-OPERATIVE DIAGNOSIS:  Coronary Artery Disease  PROCEDURE:   CORONARY ARTERY BYPASS GRAFTING   LIMA->D1 SVG->OM2 SVG->PDA  TRANSESOPHAGEAL ECHOCARDIOGRAM (TEE) (N/A)  SURGEON:  Ivin Poot, MD - Primary  PHYSICIAN ASSISTANT: Zanobia Griebel  ANESTHESIA:   general  EBL:  Per anesthesia and perfusion records   BLOOD ADMINISTERED: PRBC's x 4 units  DRAINS: Pleural and mediastinal drains   LOCAL MEDICATIONS USED:  NONE  SPECIMEN:  No Specimen  DISPOSITION OF SPECIMEN:  N/A  COUNTS:  YES  DICTATION: .Dragon Dictation  PLAN OF CARE: Admit to inpatient   PATIENT DISPOSITION:  ICU - intubated and hemodynamically stable.   Delay start of Pharmacological VTE agent (>24hrs) due to surgical blood loss or risk of bleeding: yes

## 2020-03-24 NOTE — Progress Notes (Signed)
Pre Procedure note for inpatients:   Jimmy Blankenship has been scheduled for Procedure(s): CORONARY ARTERY BYPASS GRAFTING (CABG) (N/A) TRANSESOPHAGEAL ECHOCARDIOGRAM (TEE) (N/A) today. The various methods of treatment have been discussed with the patient. After consideration of the risks, benefits and treatment options the patient has consented to the planned procedure.   The patient has been seen and labs reviewed. There are no changes in the patient's condition to prevent proceeding with the planned procedure today.  Recent labs:  Lab Results  Component Value Date   WBC 7.1 03/24/2020   HGB 8.9 (L) 03/24/2020   HCT 28.8 (L) 03/24/2020   PLT 341 03/24/2020   GLUCOSE 180 (H) 03/24/2020   ALT 44 03/23/2020   AST 31 03/23/2020   NA 139 03/24/2020   K 4.9 03/24/2020   CL 105 03/24/2020   CREATININE 1.25 (H) 03/24/2020   BUN 22 03/24/2020   CO2 24 03/24/2020   TSH 1.167 03/19/2020   INR 1.1 03/19/2020   HGBA1C 6.4 (H) 03/21/2020    Len Childs, MD 03/24/2020 7:23 AM

## 2020-03-24 NOTE — Anesthesia Procedure Notes (Signed)
Procedure Name: Intubation Date/Time: 03/24/2020 7:48 AM Performed by: Griffin Dakin, CRNA Pre-anesthesia Checklist: Patient identified, Emergency Drugs available, Suction available and Patient being monitored Patient Re-evaluated:Patient Re-evaluated prior to induction Oxygen Delivery Method: Circle system utilized Preoxygenation: Pre-oxygenation with 100% oxygen Induction Type: IV induction Ventilation: Mask ventilation without difficulty Laryngoscope Size: Mac and 4 Grade View: Grade I Tube type: Oral Tube size: 8.0 mm Number of attempts: 1 Airway Equipment and Method: Stylet and Oral airway Placement Confirmation: ETT inserted through vocal cords under direct vision,  positive ETCO2 and breath sounds checked- equal and bilateral Secured at: 24 cm Tube secured with: Tape Dental Injury: Teeth and Oropharynx as per pre-operative assessment

## 2020-03-24 NOTE — Anesthesia Procedure Notes (Signed)
Central Venous Catheter Insertion Performed by: Catalina Gravel, MD, anesthesiologist Start/End7/15/2021 7:01 AM, 03/24/2020 7:06 AM Patient location: Pre-op. Preanesthetic checklist: patient identified, IV checked, site marked, risks and benefits discussed, surgical consent, monitors and equipment checked, pre-op evaluation and timeout performed Position: Trendelenburg Hand hygiene performed  and maximum sterile barriers used  Total catheter length 100. PA cath was placed.Swan type:thermodilution PA Cath depth:45 Procedure performed without using ultrasound guided technique. Attempts: 1 Patient tolerated the procedure well with no immediate complications.

## 2020-03-24 NOTE — Procedures (Signed)
Extubation Procedure Note  Patient Details:   Name: Jimmy Blankenship DOB: [birth year] MRN: [id number]   Airway Documentation:    Vent end date: 03/24/20 Vent end time: 1800   Evaluation  O2 sats: stable throughout Complications: No apparent complications Patient did tolerate procedure well. Bilateral Breath Sounds: Clear, Diminished   Yes   Pt performed VC NIF exercises prior to extubation and got 1.1L VC and -20 NIF. Pt was extubated to 4L [place] without any stridor noted or any complications noted. The interpreter was used throughout the exercises and extubation. RT will continue to monitor.   Tobi Bastos 03/24/2020, 6:09 PM

## 2020-03-24 NOTE — Progress Notes (Signed)
CTS  Extubated, stable hemodynamics postop CABG  Blood pressure 131/89, pulse 96, temperature 100 F (37.8 C), resp. rate 20, height 5\' 8"  (1.727 m), weight 65.3 kg, SpO2 95 %.

## 2020-03-24 NOTE — Progress Notes (Signed)
Patient transferred to Pre-Op via stretcher.  Belongings, including cell phone, phone charger, glasses, clothing, and extra bags given to family.

## 2020-03-24 NOTE — Anesthesia Procedure Notes (Signed)
Central Venous Catheter Insertion Performed by: Catalina Gravel, MD, anesthesiologist Start/End7/15/2021 6:51 AM, 03/24/2020 7:01 AM Patient location: Pre-op. Preanesthetic checklist: patient identified, IV checked, site marked, risks and benefits discussed, surgical consent, monitors and equipment checked, pre-op evaluation, timeout performed and anesthesia consent Position: Trendelenburg Lidocaine 1% used for infiltration and patient sedated Hand hygiene performed , maximum sterile barriers used  and Seldinger technique used Catheter size: 8.5 Fr Central line was placed.Sheath introducer Procedure performed using ultrasound guided technique. Ultrasound Notes:anatomy identified, needle tip was noted to be adjacent to the nerve/plexus identified, no ultrasound evidence of intravascular and/or intraneural injection and image(s) printed for medical record Attempts: 1 Following insertion, line sutured, dressing applied and Biopatch. Post procedure assessment: free fluid flow, blood return through all ports and no air  Patient tolerated the procedure well with no immediate complications.

## 2020-03-24 NOTE — Progress Notes (Addendum)
PROGRESS NOTE    Jimmy Blankenship  WCH:[id number] DOB: 17-Feb-1951 DOA: 03/16/2020 PCP: [name], MD   Brief Narrative: Jimmy Blankenship is a 69 y.o. [name] speaking  male with medical history significant of coronary artery disease disease status post stenting, diabetes type 2, hypertension, hyperlipidemia, normocytic anemia who presents to the emergency department at [facility] with complaints of chest pain.  He was sent from [facility] to [facility] for further evaluation due to typical symptoms.  Troponins were negative.  High suspicion for unstable angina.    Also noted to have significant normocytic anemia with positive FOBT.  He underwent cardiac cath with finding of severe triple-vessel disease.  Cardiothoracic surgery consulted and patient underwent CABG on 03/24/2020.  Assessment & Plan:   Principal Problem:   Nonspecific chest pain Active Problems:   CAD (coronary artery disease)   DM2 (diabetes mellitus, type 2) (HCC)   HTN (hypertension)   HLD (hyperlipidemia)   Normocytic anemia   AKI (acute kidney injury) ([place])   Chest pain   S/P CABG x 3    Unstable angina/typical chest pain/CAD: His pain was associated with exertion and relieved with nitroglycerin. History of stent placement about 11 years  ago in [place].  Follows with cardiology at [facility].  Takes Plavix, metoprolol, Lipitor, losartan .  Cardiac cath done here which showed severe diffuse three-vessel coronary disease with decreased left ventricular function with ejection fraction of 40%.  Aspirin added.  Echo shows decreased ejection fraction with hypokinesis/akinesis of distal inferior, mid/distal septal, distal lateral and apical walls.  EF 35%.  He remains on heparin and is symptom-free now.  Continue aspirin, atorvastatin, losartan and Toprol increased today to 50 mg p.o. daily by cardiology.  Patient now status post CABG.  AKI: His baseline creatinine ranges from 1.1-1.2.  Elevated creatinine on  presentation.  Now resolved.  Normocytic anemia: Baseline hemoglobin in the range of 7.  No history of melena or hematochezia.  He takes Plavix.  Positive stool occult blood.  Low iron as per iron studies.  High suspicion for GI bleed.  He has an appointment with gastroenterology at [facility].  GI consulted here and recommended to follow-up with his gastroenterologist as an outpatient for further work-up, no EGD/Colo planned here.  Recommended to continue Protonix. S/P a dose of IV iron.  Hemoglobin dropped to 7.7 on 03/18/2020 and he received 1 unit of peritransplant.  Posttransfusion hemoglobin today is 9.2.  Of note, patient also received 4 unit of PRBC transfusion during his surgery on March 24, 2020.  Essential hypertension: Currently blood pressure stable.  Continue current medications.  Monitor blood pressure  Hyperlipidemia: On Lipitor  Right lung upper lobe opacity: CT chest without contrast shows focal groundglass opacity in posterior right upper lobe.  This could be inflammatory or infectious and low-grade adenocarcinoma cannot definitely be excluded.  He has no leukocytosis or any respiratory symptoms and he is afebrile.  I doubt pneumonia.  Follow-up CT in 6 months is recommended.  Diabetes type 2: On glipizide and Metformin at home.  Blood sugar fairly controlled.  Primary team/cardiothoracic surgery now managing his diabetes as well.  Nothing that hospitalist is actively managing on this gentleman.  Primary team is now cardiothoracic surgery.  I have consulted diabetes coordinator to help primary team manage diabetes as well.  Hospitalist service will sign off.  Please reconsult if our services are needed.      DVT prophylaxis: Heparin GTT Code  Status: Full Family Communication: No family present at bedside.  Status is: Inpatient  Remains inpatient appropriate because:Ongoing diagnostic testing needed not appropriate for outpatient work up   Dispo: The patient is from: Home               Anticipated d/c is to: Home              Anticipated d/c date is: > 3 days,               Patient currently not stable for discharge.   Consultants: Cardiology, gastroenterology, cardiothoracic surgery  Procedures:None  Antimicrobials:  Anti-infectives (From admission, onward)   Start     Dose/Rate Route Frequency Ordered Stop   03/24/20 2000  vancomycin (VANCOCIN) IVPB 1000 mg/200 mL premix     Discontinue     1,000 mg 200 mL/hr over 60 Minutes Intravenous  Once 03/24/20 1336     03/24/20 1700  cefUROXime (ZINACEF) 1.5 g in sodium chloride 0.9 % 100 mL IVPB     Discontinue     1.5 g 200 mL/hr over 30 Minutes Intravenous Every 12 hours 03/24/20 1336 03/26/20 1659   03/24/20 0400  vancomycin (VANCOREADY) IVPB 1250 mg/250 mL        1,250 mg 166.7 mL/hr over 90 Minutes Intravenous To Surgery 03/23/20 1144 03/24/20 0859   03/24/20 0400  cefUROXime (ZINACEF) 1.5 g in sodium chloride 0.9 % 100 mL IVPB        1.5 g 200 mL/hr over 30 Minutes Intravenous To Surgery 03/23/20 1144 03/24/20 0755   03/24/20 0400  cefUROXime (ZINACEF) 750 mg in sodium chloride 0.9 % 100 mL IVPB        750 mg 200 mL/hr over 30 Minutes Intravenous To Surgery 03/23/20 1144 03/24/20 1224      Subjective: Seen and examined.  Family at bedside.  Patient's daughter helped as interpreter.  Other than chest pain, he has no other complaint.  Objective: Vitals:   03/24/20 0420 03/24/20 1100 03/24/20 1340 03/24/20 1508  BP: 112/72  (!) 127/45 (!) 134/57  Pulse: 85  89 90  Resp: 18  12 12   Temp: 98 F (36.7 C)     TempSrc: Oral     SpO2: 96%  100% 100%  Weight: 65.3 kg     Height:  5\' 8"  (1.727 m) 5\' 8"  (1.727 m)     Intake/Output Summary (Last 24 hours) at 03/24/2020 1522 Last data filed at 03/24/2020 1238 Gross per 24 hour  Intake 3647.19 ml  Output 1940 ml  Net 1707.19 ml   Filed Weights   03/15/20 2310 03/16/20 1628 03/24/20 0420  Weight: 68 kg 65.9 kg 65.3 kg    Examination:  General  exam: Appears calm and comfortable  Respiratory system: Clear to auscultation. Respiratory effort normal. Cardiovascular system: S1 & S2 heard, RRR. No JVD, murmurs, rubs, gallops or clicks. No pedal edema.   Gastrointestinal system: Abdomen is nondistended, soft and nontender. No organomegaly or masses felt. Normal bowel sounds heard. Central nervous system: Alert and oriented. No focal neurological deficits. Extremities: Symmetric 5 x 5 power. Skin: No rashes, lesions or ulcers.  Psychiatry: Judgement and insight appear normal. Mood & affect appropriate.    Data Reviewed: I have personally reviewed following labs and imaging studies  CBC: Recent Labs  Lab 03/18/20 1017 03/18/20 2015 03/20/20 0146 03/20/20 0146 03/21/20 0222 03/21/20 0222 03/22/20 0236 03/22/20 0236 03/23/20 7124 03/23/20 5809 03/24/20 0240 03/24/20 0752 03/24/20 1117 03/24/20 1120 03/24/20  1146 03/24/20 1213 03/24/20 1216  WBC 5.3   < > 6.8  --  8.5  --  7.9  --  6.5  --  7.1  --   --   --   --   --   --   NEUTROABS 3.6  --   --   --   --   --   --   --   --   --   --   --   --   --   --   --   --   HGB 7.7*   < > 8.9*   < > 8.8*   < > 8.5*   < > 8.6*   < > 8.9*   < > 7.8* 7.8* 8.8* 8.8* 9.5*  HCT 24.6*   < > 27.8*   < > 27.9*   < > 27.6*   < > 27.7*   < > 28.8*   < > 24.8* 23.0* 26.0* 26.0* 28.0*  MCV 83.1   < > 83.7  --  85.1  --  85.2  --  85.8  --  87.3  --   --   --   --   --   --   PLT 351   < > 342   < > 343  --  331  --  324  --  341  --  170  --   --   --   --    < > = values in this interval not displayed.   Basic Metabolic Panel: Recent Labs  Lab 03/18/20 1017 03/18/20 1017 03/19/20 0224 03/19/20 0224 03/23/20 0318 03/23/20 0318 03/24/20 0240 03/24/20 0752 03/24/20 0944 03/24/20 1002 03/24/20 1032 03/24/20 1032 03/24/20 1102 03/24/20 1120 03/24/20 1146 03/24/20 1213 03/24/20 1216  NA 137   < > 137   < > 137   < > 139   < > 141   < > 139   < > 138 139 139 141 141  K 3.9   < >  4.0   < > 4.5   < > 4.9   < > 3.8   < > 3.6   < > 4.3 4.1 4.8 3.9 4.0  CL 109   < > 106   < > 105   < > 105   < > 104  --  99  --   --  102 102  --  104  CO2 22  --  21*  --  22  --  24  --   --   --   --   --   --   --   --   --   --   GLUCOSE 222*   < > 166*   < > 166*   < > 180*   < > 149*  --  149*  --   --  179* 186*  --  165*  BUN 16   < > 19   < > 19   < > 22   < > 19  --  17  --   --  16 16  --  15  CREATININE 1.17   < > 1.21   < > 1.22   < > 1.25*   < > 0.80  --  0.80  --   --  0.70 0.70  --  0.70  CALCIUM 8.0*  --  8.6*  --  8.6*  --  8.8*  --   --   --   --   --   --   --   --   --   --    < > =  values in this interval not displayed.   GFR: Estimated Creatinine Clearance: 82.8 mL/min (by C-G formula based on SCr of 0.7 mg/dL). Liver Function Tests: Recent Labs  Lab 03/19/20 0224 03/23/20 0318  AST 20 31  ALT 20 44  ALKPHOS 61 60  BILITOT 1.1 0.4  PROT 6.0* 5.7*  ALBUMIN 3.1* 2.8*   No results for input(s): LIPASE, AMYLASE in the last 168 hours. No results for input(s): AMMONIA in the last 168 hours. Coagulation Profile: Recent Labs  Lab 03/19/20 0224 03/24/20 1408  INR 1.1 1.4*   Cardiac Enzymes: No results for input(s): CKTOTAL, CKMB, CKMBINDEX, TROPONINI in the last 168 hours. BNP (last 3 results) No results for input(s): PROBNP in the last 8760 hours. HbA1C: No results for input(s): HGBA1C in the last 72 hours. CBG: Recent Labs  Lab 03/23/20 1733 03/23/20 2114 03/24/20 0532 03/24/20 1406 03/24/20 1409  GLUCAP 207* 249* 160* 104* 100*   Lipid Profile: No results for input(s): CHOL, HDL, LDLCALC, TRIG, CHOLHDL, LDLDIRECT in the last 72 hours. Thyroid Function Tests: No results for input(s): TSH, T4TOTAL, FREET4, T3FREE, THYROIDAB in the last 72 hours. Anemia Panel: No results for input(s): VITAMINB12, FOLATE, FERRITIN, TIBC, IRON, RETICCTPCT in the last 72 hours. Sepsis Labs: No results for input(s): PROCALCITON, LATICACIDVEN in the last 168  hours.  Recent Results (from the past 240 hour(s))  SARS Coronavirus 2 by RT PCR (hospital order, performed in [facility] hospital lab) Nasopharyngeal Nasopharyngeal Swab     Status: None   Collection Time: 03/16/20  2:40 AM   Specimen: Nasopharyngeal Swab  Result Value Ref Range Status   SARS Coronavirus 2 NEGATIVE NEGATIVE Final    Comment: (NOTE) SARS-CoV-2 target nucleic acids are NOT DETECTED.  The SARS-CoV-2 RNA is generally detectable in upper and lower respiratory specimens during the acute phase of infection. The lowest concentration of SARS-CoV-2 viral copies this assay can detect is 250 copies / mL. A negative result does not preclude SARS-CoV-2 infection and should not be used as the sole basis for treatment or other patient management decisions.  A negative result may occur with improper specimen collection / handling, submission of specimen other than nasopharyngeal swab, presence of viral mutation(s) within the areas targeted by this assay, and inadequate number of viral copies (<250 copies / mL). A negative result must be combined with clinical observations, patient history, and epidemiological information.  Fact Sheet for Patients:   StrictlyIdeas.no  Fact Sheet for Healthcare Providers: BankingDealers.co.za  This test is not yet approved or  cleared by the [place] FDA and has been authorized for detection and/or diagnosis of SARS-CoV-2 by FDA under an Emergency Use Authorization (EUA).  This EUA will remain in effect (meaning this test can be used) for the duration of the COVID-19 declaration under Section 564(b)(1) of the Act, 21 U.S.C. section 360bbb-3(b)(1), unless the authorization is terminated or revoked sooner.  Performed at [facility], [address] [place], [place] [address]   MRSA PCR Screening     Status: None   Collection Time: 03/16/20  5:00 PM   Specimen: Nasal Mucosa;  Nasopharyngeal  Result Value Ref Range Status   MRSA by PCR NEGATIVE NEGATIVE Final    Comment:        The GeneXpert MRSA Assay (FDA approved for NASAL specimens only), is one component of a comprehensive MRSA colonization surveillance program. It is not intended to diagnose MRSA infection nor to guide or monitor treatment for MRSA infections. Performed  at [facility], [place] [address], [place], [place] [address]   Surgical pcr screen     Status: None   Collection Time: 03/18/20  4:27 PM   Specimen: Nasal Mucosa; Nasal Swab  Result Value Ref Range Status   MRSA, PCR NEGATIVE NEGATIVE Final   Staphylococcus aureus NEGATIVE NEGATIVE Final    Comment: (NOTE) The Xpert SA Assay (FDA approved for NASAL specimens in patients 64 years of age and older), is one component of a comprehensive surveillance program. It is not intended to diagnose infection nor to guide or monitor treatment. Performed at [facility], [place] [address] [place], [place] [address]          Radiology Studies: DG Chest 2 View  Result Date: 03/23/2020 CLINICAL DATA:  Chest pain. EXAM: CHEST - 2 VIEW COMPARISON:  March 15, 2020. FINDINGS: The heart size and mediastinal contours are within normal limits. Both lungs are clear. No pneumothorax or pleural effusion is noted. The visualized skeletal structures are unremarkable. IMPRESSION: No active cardiopulmonary disease. Electronically Signed   By: Marijo Conception M.D.   On: 03/23/2020 08:24   DG Chest Portable 1 View  Result Date: 03/24/2020 CLINICAL DATA:  CABG, intubated EXAM: PORTABLE CHEST 1 VIEW COMPARISON:  03/23/2020 FINDINGS: Single frontal view of the chest demonstrates endotracheal tube overlying tracheal air column tip at level of thoracic inlet. Mediastinal drain and bilateral chest tubes are identified. There is a right internal jugular central venous catheter tip overlying the main pulmonary outflow tract. Epicardial pacing wires are also  seen. The cardiac silhouette is unremarkable. Lung volumes are diminished with crowding the central vasculature. No airspace disease, effusion, or pneumothorax. Postsurgical changes are seen from median sternotomy. IMPRESSION: 1. Support devices as above. 2. Low lung volumes, no acute intrathoracic process. Electronically Signed   By: Randa Ngo M.D.   On: 03/24/2020 15:01        Scheduled Meds: . [START ON 03/25/2020] acetaminophen  1,000 mg Oral Q6H   Or  . [START ON 03/25/2020] acetaminophen (TYLENOL) oral liquid 160 mg/5 mL  1,000 mg Per Tube Q6H  . [START ON 03/25/2020] aspirin EC  325 mg Oral Daily   Or  . [START ON 03/25/2020] aspirin  324 mg Per Tube Daily  . atorvastatin  80 mg Oral Daily  . [START ON 03/25/2020] bisacodyl  10 mg Oral Daily   Or  . [START ON 03/25/2020] bisacodyl  10 mg Rectal Daily  . Chlorhexidine Gluconate Cloth  6 each Topical Daily  . [START ON 03/25/2020] docusate sodium  200 mg Oral Daily  . metoprolol tartrate  12.5 mg Oral BID   Or  . metoprolol tartrate  12.5 mg Per Tube BID  . [START ON 03/26/2020] pantoprazole  40 mg Oral Daily  . [START ON 03/25/2020] sodium chloride flush  3 mL Intravenous Q12H   Continuous Infusions: . sodium chloride 10 mL/hr at 03/24/20 1339  . [START ON 03/25/2020] sodium chloride    . sodium chloride 10 mL/hr at 03/24/20 1339  . albumin human    . cefUROXime (ZINACEF)  IV    . dexmedetomidine (PRECEDEX) IV infusion 1 mcg/kg/hr (03/24/20 1452)  . famotidine (PEPCID) IV 20 mg (03/24/20 1408)  . insulin    . lactated ringers    . lactated ringers    . lactated ringers 20 mL/hr at 03/24/20 1339  . magnesium sulfate 4 g (03/24/20 1359)  . nitroGLYCERIN 0 mcg/min (03/24/20 1339)  .  phenylephrine (NEO-SYNEPHRINE) Adult infusion 0 mcg/min (03/24/20 1339)  . potassium chloride 10 mEq (03/24/20 1448)  . vancomycin       LOS: 6 days   Time spent 18 minutes  Darliss Cheney, MD [facility] P7/15/2021, 3:22 PM

## 2020-03-24 NOTE — Anesthesia Postprocedure Evaluation (Signed)
Anesthesia Post Note  Patient: Aadi Bordner  Procedure(s) Performed: CORONARY ARTERY BYPASS GRAFTING (CABG), ON PUMP, TIMES THREE, USING LEFT INTERNAL MAMMARY ARTERY AND RIGHT ENDOSCOPICALLY HARVESTED GREATER SAPHENOUS VEIN (N/A Chest) TRANSESOPHAGEAL ECHOCARDIOGRAM (TEE) (N/A )     Patient location during evaluation: SICU Anesthesia Type: General Level of consciousness: sedated Pain management: pain level controlled Vital Signs Assessment: post-procedure vital signs reviewed and stable Respiratory status: patient remains intubated per anesthesia plan Cardiovascular status: stable Postop Assessment: no apparent nausea or vomiting Anesthetic complications: no   No complications documented.  Last Vitals:  Vitals:   03/24/20 1340 03/24/20 1508  BP: (!) 127/45 (!) 134/57  Pulse: 89 90  Resp: 12 12  Temp:    SpO2: 100% 100%    Last Pain:  Vitals:   03/24/20 0420  TempSrc: Oral  PainSc: 0-No pain                 Bradford Cazier DAVID

## 2020-03-24 NOTE — Progress Notes (Signed)
  Echocardiogram Echocardiogram Transesophageal has been performed.  Bobbye Charleston 03/24/2020, 9:07 AM

## 2020-03-24 NOTE — Anesthesia Preprocedure Evaluation (Signed)
Anesthesia Evaluation  Patient identified by MRN, date of birth, ID band Patient awake    Reviewed: Allergy & Precautions, NPO status , Patient's Chart, lab work & pertinent test results  Airway Mallampati: I  TM Distance: >3 FB Neck ROM: Full    Dental   Pulmonary former smoker,    Pulmonary exam normal        Cardiovascular hypertension, Pt. on medications + CAD and + Cardiac Stents  Normal cardiovascular exam     Neuro/Psych    GI/Hepatic   Endo/Other  diabetes, Type 2  Renal/GU      Musculoskeletal   Abdominal   Peds  Hematology   Anesthesia Other Findings   Reproductive/Obstetrics                             Anesthesia Physical Anesthesia Plan  ASA: III  Anesthesia Plan: General   Post-op Pain Management:    Induction: Intravenous  PONV Risk Score and Plan: 2  Airway Management Planned: Oral ETT  Additional Equipment: Arterial line, PA Cath, TEE and Ultrasound Guidance Line Placement  Intra-op Plan:   Post-operative Plan: Post-operative intubation/ventilation  Informed Consent: I have reviewed the patients History and Physical, chart, labs and discussed the procedure including the risks, benefits and alternatives for the proposed anesthesia with the patient or authorized representative who has indicated his/her understanding and acceptance.       Plan Discussed with: CRNA and Surgeon  Anesthesia Plan Comments:         Anesthesia Quick Evaluation

## 2020-03-25 ENCOUNTER — Inpatient Hospital Stay (HOSPITAL_COMMUNITY): Payer: Medicare Other

## 2020-03-25 ENCOUNTER — Encounter (HOSPITAL_COMMUNITY): Payer: Self-pay | Admitting: Cardiothoracic Surgery

## 2020-03-25 LAB — PREPARE FRESH FROZEN PLASMA: Unit division: 0

## 2020-03-25 LAB — CBC
HCT: 29 % — ABNORMAL LOW (ref 39.0–52.0)
HCT: 31.3 % — ABNORMAL LOW (ref 39.0–52.0)
Hemoglobin: 9.2 g/dL — ABNORMAL LOW (ref 13.0–17.0)
Hemoglobin: 10 g/dL — ABNORMAL LOW (ref 13.0–17.0)
MCH: 26.9 pg (ref 26.0–34.0)
MCH: 27.7 pg (ref 26.0–34.0)
MCHC: 31.7 g/dL (ref 30.0–36.0)
MCHC: 31.9 g/dL (ref 30.0–36.0)
MCV: 84.8 fL (ref 80.0–100.0)
MCV: 86.7 fL (ref 80.0–100.0)
Platelets: 149 10*3/uL — ABNORMAL LOW (ref 150–400)
Platelets: 174 10*3/uL (ref 150–400)
RBC: 3.42 MIL/uL — ABNORMAL LOW (ref 4.22–5.81)
RBC: 3.61 MIL/uL — ABNORMAL LOW (ref 4.22–5.81)
RDW: 17 % — ABNORMAL HIGH (ref 11.5–15.5)
RDW: 17.4 % — ABNORMAL HIGH (ref 11.5–15.5)
WBC: 10 10*3/uL (ref 4.0–10.5)
WBC: 14 10*3/uL — ABNORMAL HIGH (ref 4.0–10.5)
nRBC: 0 % (ref 0.0–0.2)
nRBC: 0 % (ref 0.0–0.2)

## 2020-03-25 LAB — BASIC METABOLIC PANEL
Anion gap: 8 (ref 5–15)
Anion gap: 9 (ref 5–15)
BUN: 14 mg/dL (ref 8–23)
BUN: 18 mg/dL (ref 8–23)
CO2: 20 mmol/L — ABNORMAL LOW (ref 22–32)
CO2: 21 mmol/L — ABNORMAL LOW (ref 22–32)
Calcium: 7.7 mg/dL — ABNORMAL LOW (ref 8.9–10.3)
Calcium: 8.3 mg/dL — ABNORMAL LOW (ref 8.9–10.3)
Chloride: 108 mmol/L (ref 98–111)
Chloride: 104 mmol/L (ref 98–111)
Creatinine, Ser: 1.01 mg/dL (ref 0.61–1.24)
Creatinine, Ser: 1.28 mg/dL — ABNORMAL HIGH (ref 0.61–1.24)
GFR calc Af Amer: >60 mL/min (ref >60)
GFR calc Af Amer: >60 mL/min (ref >60)
GFR calc non Af Amer: >60 mL/min (ref >60)
GFR calc non Af Amer: 58 mL/min — ABNORMAL LOW (ref >60)
Glucose, Bld: 147 mg/dL — ABNORMAL HIGH (ref 70–99)
Glucose, Bld: 177 mg/dL — ABNORMAL HIGH (ref 70–99)
Potassium: 4.7 mmol/L (ref 3.5–5.1)
Potassium: 4.7 mmol/L (ref 3.5–5.1)
Sodium: 136 mmol/L (ref 135–145)
Sodium: 134 mmol/L — ABNORMAL LOW (ref 135–145)

## 2020-03-25 LAB — BPAM FFP
Blood Product Expiration Date: 202107182359
Blood Product Expiration Date: 202107182359
ISSUE DATE / TIME: 202107151139
ISSUE DATE / TIME: 202107151139
Unit Type and Rh: 6200
Unit Type and Rh: 6200

## 2020-03-25 LAB — GLUCOSE, CAPILLARY
Glucose-Capillary: 111 mg/dL — ABNORMAL HIGH (ref 70–99)
Glucose-Capillary: 116 mg/dL — ABNORMAL HIGH (ref 70–99)
Glucose-Capillary: 130 mg/dL — ABNORMAL HIGH (ref 70–99)
Glucose-Capillary: 133 mg/dL — ABNORMAL HIGH (ref 70–99)
Glucose-Capillary: 134 mg/dL — ABNORMAL HIGH (ref 70–99)
Glucose-Capillary: 137 mg/dL — ABNORMAL HIGH (ref 70–99)
Glucose-Capillary: 141 mg/dL — ABNORMAL HIGH (ref 70–99)
Glucose-Capillary: 142 mg/dL — ABNORMAL HIGH (ref 70–99)
Glucose-Capillary: 146 mg/dL — ABNORMAL HIGH (ref 70–99)
Glucose-Capillary: 179 mg/dL — ABNORMAL HIGH (ref 70–99)
Glucose-Capillary: 181 mg/dL — ABNORMAL HIGH (ref 70–99)
Glucose-Capillary: 189 mg/dL — ABNORMAL HIGH (ref 70–99)
Glucose-Capillary: 195 mg/dL — ABNORMAL HIGH (ref 70–99)
Glucose-Capillary: 206 mg/dL — ABNORMAL HIGH (ref 70–99)

## 2020-03-25 LAB — POCT I-STAT 7, (LYTES, BLD GAS, ICA,H+H)
Acid-base deficit: 6 mmol/L — ABNORMAL HIGH (ref 0.0–2.0)
Bicarbonate: 19.9 mmol/L — ABNORMAL LOW (ref 20.0–28.0)
Calcium, Ion: 1.19 mmol/L (ref 1.15–1.40)
HCT: 27 % — ABNORMAL LOW (ref 39.0–52.0)
Hemoglobin: 9.2 g/dL — ABNORMAL LOW (ref 13.0–17.0)
O2 Saturation: 96 %
Patient temperature: 37.9
Potassium: 4.4 mmol/L (ref 3.5–5.1)
Sodium: 139 mmol/L (ref 135–145)
TCO2: 21 mmol/L — ABNORMAL LOW (ref 22–32)
pCO2 arterial: 39.1 mmHg (ref 32.0–48.0)
pH, Arterial: 7.319 — ABNORMAL LOW (ref 7.350–7.450)
pO2, Arterial: 90 mmHg (ref 83.0–108.0)

## 2020-03-25 LAB — MAGNESIUM
Magnesium: 2 mg/dL (ref 1.7–2.4)
Magnesium: 2.1 mg/dL (ref 1.7–2.4)

## 2020-03-25 IMAGING — DX DG CHEST 1V PORT
1 series · 1 of 1 positions shown · non-contrast
Comparison: Radiographs [DATE] and [DATE].

CLINICAL DATA: Postop CABG.

EXAM:
PORTABLE CHEST 1 VIEW

[chest ap]
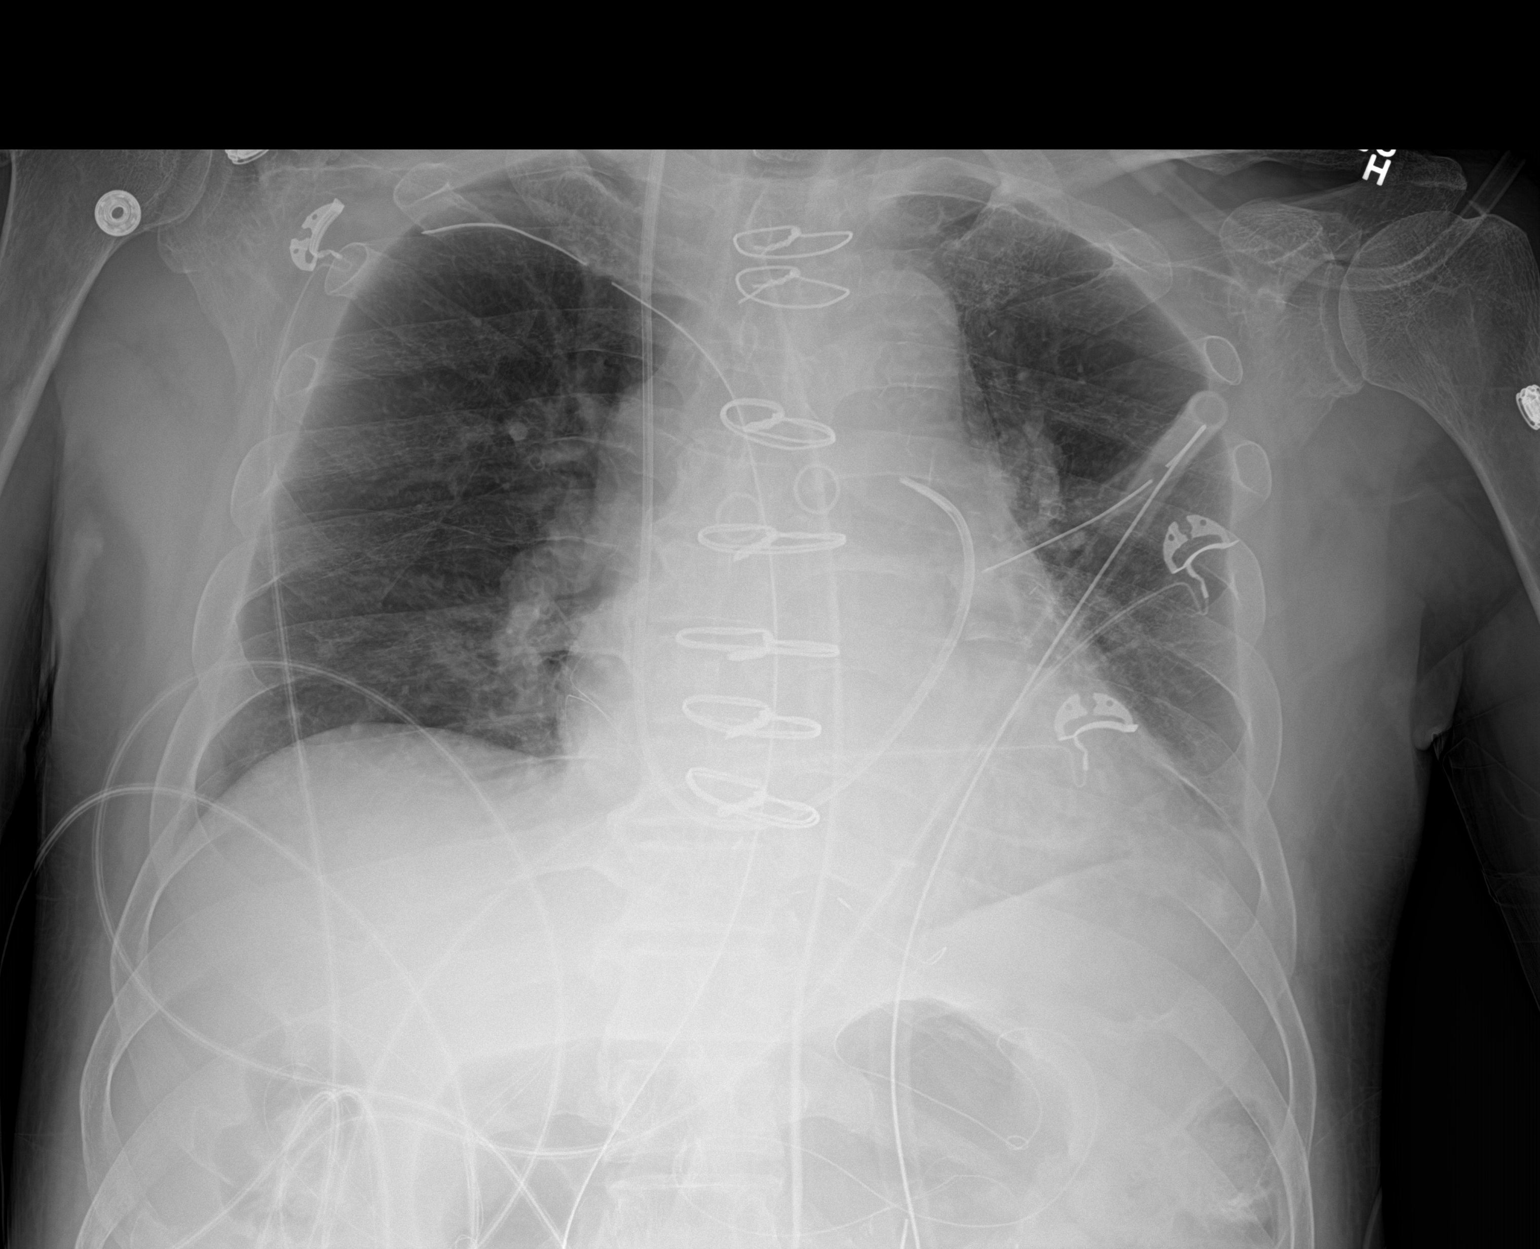

[1 of 1 positions shown; findings below may reference images not displayed]

FINDINGS: [JA] hours. Interval extubation. Right IJ Swan-Ganz catheter
projects to the main pulmonary artery. Mediastinal drain and
bilateral chest tubes remain in place.

The heart size and mediastinal contours are stable post CABG. There
is minimal atelectasis at the left lung base. No edema, confluent
airspace opacity, pleural effusion or pneumothorax. No acute osseous
findings.
IMPRESSION: Interval extubation with otherwise similar appearance of the support
system. No acute cardiopulmonary process post CABG.

## 2020-03-25 MED ORDER — INSULIN DETEMIR 100 UNIT/ML ~~LOC~~ SOLN
5.0000 [IU] | Freq: Every day | SUBCUTANEOUS | Status: DC
  Administered 2020-03-26: 5 [IU] via SUBCUTANEOUS
  Filled 2020-03-25 (×2): qty 0.05

## 2020-03-25 MED ORDER — INSULIN DETEMIR 100 UNIT/ML ~~LOC~~ SOLN
5.0000 [IU] | Freq: Two times a day (BID) | SUBCUTANEOUS | Status: DC
  Filled 2020-03-25 (×2): qty 0.05

## 2020-03-25 MED ORDER — SODIUM BICARBONATE 8.4 % IV SOLN
50.0000 meq | Freq: Once | INTRAVENOUS | Status: AC
  Administered 2020-03-25: 50 meq via INTRAVENOUS
  Filled 2020-03-25: qty 50

## 2020-03-25 MED ORDER — FENTANYL CITRATE (PF) 100 MCG/2ML IJ SOLN
50.0000 ug | INTRAMUSCULAR | Status: DC | PRN
  Administered 2020-03-25: 50 ug via INTRAVENOUS
  Filled 2020-03-25: qty 2

## 2020-03-25 MED ORDER — FUROSEMIDE 10 MG/ML IJ SOLN
20.0000 mg | Freq: Two times a day (BID) | INTRAMUSCULAR | Status: DC
  Administered 2020-03-25 – 2020-03-27 (×5): 20 mg via INTRAVENOUS
  Filled 2020-03-25 (×5): qty 2

## 2020-03-25 MED ORDER — INSULIN DETEMIR 100 UNIT/ML ~~LOC~~ SOLN
5.0000 [IU] | Freq: Two times a day (BID) | SUBCUTANEOUS | Status: AC
  Administered 2020-03-25 (×2): 5 [IU] via SUBCUTANEOUS
  Filled 2020-03-25 (×3): qty 0.05

## 2020-03-25 MED ORDER — INSULIN ASPART 100 UNIT/ML ~~LOC~~ SOLN
0.0000 [IU] | SUBCUTANEOUS | Status: DC
  Administered 2020-03-25: 2 [IU] via SUBCUTANEOUS
  Administered 2020-03-25: 4 [IU] via SUBCUTANEOUS
  Administered 2020-03-25: 2 [IU] via SUBCUTANEOUS
  Administered 2020-03-25: 4 [IU] via SUBCUTANEOUS

## 2020-03-25 MED ORDER — KETOROLAC TROMETHAMINE 15 MG/ML IJ SOLN
15.0000 mg | Freq: Once | INTRAMUSCULAR | Status: AC
  Administered 2020-03-25: 15 mg via INTRAVENOUS
  Filled 2020-03-25: qty 1

## 2020-03-25 MED FILL — Sodium Chloride IV Soln 0.9%: INTRAVENOUS | Qty: 2000 | Status: AC

## 2020-03-25 MED FILL — Heparin Sodium (Porcine) Inj 1000 Unit/ML: INTRAMUSCULAR | Qty: 30 | Status: AC

## 2020-03-25 MED FILL — Potassium Chloride Inj 2 mEq/ML: INTRAVENOUS | Qty: 40 | Status: AC

## 2020-03-25 MED FILL — Heparin Sodium (Porcine) Inj 1000 Unit/ML: INTRAMUSCULAR | Qty: 10 | Status: AC

## 2020-03-25 MED FILL — Calcium Chloride Inj 10%: INTRAVENOUS | Qty: 10 | Status: AC

## 2020-03-25 MED FILL — Electrolyte-R (PH 7.4) Solution: INTRAVENOUS | Qty: 4000 | Status: AC

## 2020-03-25 MED FILL — Sodium Bicarbonate IV Soln 8.4%: INTRAVENOUS | Qty: 50 | Status: AC

## 2020-03-25 MED FILL — Mannitol IV Soln 20%: INTRAVENOUS | Qty: 500 | Status: AC

## 2020-03-25 MED FILL — Albumin, Human Inj 5%: INTRAVENOUS | Qty: 250 | Status: AC

## 2020-03-25 MED FILL — Lidocaine HCl Local Soln Prefilled Syringe 100 MG/5ML (2%): INTRAMUSCULAR | Qty: 5 | Status: AC

## 2020-03-25 MED FILL — Magnesium Sulfate Inj 50%: INTRAMUSCULAR | Qty: 10 | Status: AC

## 2020-03-25 NOTE — Progress Notes (Signed)
Inpatient Diabetes Program Recommendations  AACE/ADA: New Consensus Statement on Inpatient Glycemic Control (2015)  Target Ranges:  Prepandial:   less than 140 mg/dL      Peak postprandial:   less than 180 mg/dL (1-2 hours)      Critically ill patients:  140 - 180 mg/dL   Lab Results  Component Value Date   GLUCAP 181 (H) 03/25/2020   HGBA1C 6.4 (H) 03/21/2020    Review of Glycemic Control  Diabetes history: DM 2 Outpatient Diabetes medications: Glipizide 5 mg bid, Metformin 1000 mg bid Current orders for Inpatient glycemic control:  IV insulin transition today to: Levemir 5 units bid Novolog 0-24 units Q4 hours  A1c 6.4% on 03/21/20  Received DM Coordinator referral for DM Management. Pt transitioning to Levemir 5 units bid and Novolog 0-24 units Q4 hours. Agree with insulin dosing at this time will monitor glucose trends.  Thanks,  Tama Headings RN, MSN, BC-ADM Inpatient Diabetes Coordinator Team Pager [phone] (8a-5p)

## 2020-03-25 NOTE — Progress Notes (Signed)
1 Day Post-Op Procedure(s) (LRB): CORONARY ARTERY BYPASS GRAFTING (CABG), ON PUMP, TIMES THREE, USING LEFT INTERNAL MAMMARY ARTERY AND RIGHT ENDOSCOPICALLY HARVESTED GREATER SAPHENOUS VEIN (N/A) TRANSESOPHAGEAL ECHOCARDIOGRAM (TEE) (N/A) Subjective: C/o pain nsr  Objective: Vital signs in last 24 hours: Temp:  [96.1 F (35.6 C)-100.4 F (38 C)] 100.4 F (38 C) (07/16 0600) Pulse Rate:  [69-96] 73 (07/16 0600) Cardiac Rhythm: Ventricular paced (07/15 2000) Resp:  [12-27] 16 (07/16 0600) BP: (88-138)/(45-89) 102/63 (07/16 0600) SpO2:  [95 %-100 %] 98 % (07/16 0600) Arterial Line BP: (96-196)/(44-74) 153/48 (07/16 0600) FiO2 (%):  [40 %-50 %] 40 % (07/15 1723) Weight:  [70.1 kg] 70.1 kg (07/16 0700)  Hemodynamic parameters for last 24 hours: PAP: (14-37)/(0-21) 29/12 CO:  [4.4 L/min-6.9 L/min] 6.7 L/min CI:  [2.7 L/min/m2-4.2 L/min/m2] 4.1 L/min/m2  Intake/Output from previous day: 07/15 0701 - 07/16 0700 In: 5078.3 [I.V.:3371.1; Blood:980; IV Piggyback:727.2] Out: 4163 [Urine:2780; Chest Tube:600] Intake/Output this shift: No intake/output data recorded.       Exam    General- alert and comfortable    Neck- no JVD, no cervical adenopathy palpable, no carotid bruit   Lungs- clear without rales, wheezes   Cor- regular rate and rhythm, no murmur , gallop   Abdomen- soft, non-tender   Extremities - warm, non-tender, minimal edema   Neuro- oriented, appropriate, no focal weakness   Lab Results: Recent Labs    03/24/20 1911 03/24/20 1914 03/25/20 0419 03/25/20 0639  WBC 11.4*  --  10.0  --   HGB 10.2*   < > 9.2* 9.2*  HCT 31.6*   < > 29.0* 27.0*  PLT 168  --  149*  --    < > = values in this interval not displayed.   BMET:  Recent Labs    03/24/20 1911 03/24/20 1914 03/25/20 0419 03/25/20 0639  NA 137   < > 136 139  K 5.0   < > 4.7 4.4  CL 109  --  108  --   CO2 19*  --  20*  --   GLUCOSE 155*  --  147*  --   BUN 16  --  14  --   CREATININE 1.02  --   1.01  --   CALCIUM 7.8*  --  7.7*  --    < > = values in this interval not displayed.    PT/INR:  Recent Labs    03/24/20 1408  LABPROT 16.2*  INR 1.4*   ABG    Component Value Date/Time   PHART 7.319 (L) 03/25/2020 0639   HCO3 19.9 (L) 03/25/2020 0639   TCO2 21 (L) 03/25/2020 0639   ACIDBASEDEF 6.0 (H) 03/25/2020 0639   O2SAT 96.0 03/25/2020 0639   CBG (last 3)  Recent Labs    03/25/20 0419 03/25/20 0521 03/25/20 0637  GLUCAP 141* 134* 130*    Assessment/Plan: S/P Procedure(s) (LRB): CORONARY ARTERY BYPASS GRAFTING (CABG), ON PUMP, TIMES THREE, USING LEFT INTERNAL MAMMARY ARTERY AND RIGHT ENDOSCOPICALLY HARVESTED GREATER SAPHENOUS VEIN (N/A) TRANSESOPHAGEAL ECHOCARDIOGRAM (TEE) (N/A) Mobilize Diuresis Diabetes control See progression orders cont milrinone and check co-ox for low EF   LOS: 7 days    Jimmy Blankenship 03/25/2020

## 2020-03-25 NOTE — Progress Notes (Signed)
This nurse went in to assist pt with his second dangle this morning. The pt refused to stand. Interpreter was used to explain to the patient how important it was for him to stand and get a weight. Pt  Multiple times stated that he was not going to stand because he just had surgery and needs time heal. Nurse and charge nurse both explained (with the help of the interpreter) that sitting on the side of the bed, standing, and walking were all a major part of his healing and progression. Pt was educated, given pain meds, and left to continue resting. He apologized and said he would try later.

## 2020-03-25 NOTE — Discharge Summary (Addendum)
Physician Discharge Summary  Patient ID: Nickolus Wadding MRN: [id number] DOB/AGE: 01/20/1951 69 y.o.  Admit date: 03/16/2020 Discharge date: 03/30/2020  Admission Diagnoses:  Coronary artery disease Unstable angina pectoris Type 2 diabetes mellitus Hypertension Dyslipidemia   Discharge Diagnoses:  CAD (coronary artery disease) Unstable angina pectoris DM2 (diabetes mellitus, type 2) (HCC) HTN (hypertension) HLD (hyperlipidemia) Normocytic anemia AKI (acute kidney injury) ([place]) Chest pain S/P CABG x 3   History of Present Illness:     69 year old [name] diabetic with history of previous PCI over 10 years ago in [place] presented with symptoms of unstable angina worsening over the 48 hours prior to admission.  Patient relates associated shortness of breath but no nausea.  Pain was relieved by nitroglycerin   The patient's EKG in the ED showed sinus rhythm with occasional PVCs.  Nonspecific ST segment changes..  Cardiac enzymes are minimally elevated.   In late November the patient's echo showed EF 50-55%. Catheterization performed July 8 demonstrates severe three-vessel coronary disease with a diabetic pattern.  The posterior descending, diagonal, and circumflex are potential targets for grafting.  The LAD is atretic and chronically occluded.  LVEDP was normal.   Echocardiogram performed this admission shows EF 35% with apical hypokinesia and mild MR.  No apical thrombus with contrast.   The patient presented with anemia hemoglobin 7.7.  His stool is guaiac positive.  He was seen by GI who recommended continuing Protonix and following up as outpatient.  Last colonoscopy was over 10 years ago.   No history of hematemesis or bright red blood per rectum Blood sugar was over 200 on admission, hemoglobin A1c was 6.4.  After review of clinical data, Dr. Darcey Nora recommended coronary bypass grafting.  Mr. Docken elected to proceed with surgery.  History of Present Illness:     69 year old  [name] diabetic with history of previous PCI over 10 years ago in [place] with symptoms of unstable angina worsening over the past 48 hours prior to admission.  Patient relates associated shortness of breath but no nausea.  Pain was relieved by nitroglycerin   The patient's EKG in the ED showed sinus rhythm with occasional PVCs.  Nonspecific ST segment changes..  Cardiac enzymes are minimally elevated.   In late November the patient's echo showed EF 50-55%. Catheterization performed July 8 demonstrates severe three-vessel coronary disease with a diabetic pattern.  The posterior descending, diagonal, and circumflex are potential targets for grafting.  The LAD is atretic and chronically occluded.  LVEDP was normal.   Echo cardiogram performed yesterday shows EF 35% with apical hypokinesia and mild MR.  No apical thrombus with contrast.   The patient presented with anemia hemoglobin 7.7.  His stool is guaiac positive.  He was seen by GI who recommended continuing Protonix and following clinically.  Last colonoscopy was over 10 years ago.   No history of hematemesis or bright red blood per rectum Blood sugar was over 200 on admission, hemoglobin A1c pending  Hospital Course: Mr. Mallie Mussel was taken to the room on 03/24/2020 where CABG x3 was carried out without complication.  Left internal mammary artery was grafted to the first diagonal coronary artery.  Endoscopically harvested saphenous vein was grafted to the obtuse marginal and posterior descending coronary artery.  He separated from cardiopulmonary bypass without difficulty.  He was transferred to the ICU in stable condition.  He remained hemodynamically stable.  The mechanical ventilation was weaned and he was extubated successfully in the early evening following surgery.  Vasoactive  drips were easily weaned off on the first postoperative day. His diabetes was managed initially with an insulin drip and later with sliding scle insulin with good  results.   He was mobilized and made expected progress with ambulation. He remained in a stable sinus rhythm. He was transferred to 4E Progressive Care. Pacer wires were removed on the fifth postoperative date. He was ambulating with limited assistance, tolerating room air, his incisions are healing well, and he is ready for discharge home.   Consults: None  Significant Diagnostic Studies:   LEFT HEART CATH AND CORONARY ANGIOGRAPHY  Conclusion  Severe diffuse three-vessel coronary artery disease in the 69 year old [name] gentleman with diabetes mellitus type 2. 20% mid body left main disease. Proximal LAD stent with mid body 40 to 60% restenosis depending upon review.  Severe multifocal diffuse mid to apical LAD.  Large diagonal with ostial 50% narrowing and 60 to 70% mid narrowing. Severe diffuse disease circumflex coronary disease with proximal to mid body circumflex up to 90% narrowed.  Each of 3 obtuse marginals have high-grade obstructive disease.  The second and third obtuse marginals are large enough to be grafted. Dominant right coronary with moderate diffuse disease including smaller distal branches.  Eccentric 50 to 70% healed ulcerated plaque in the mid vessel above the proximal margin of the previously placed stent.  There is distal high-grade in-stent restenosis that is somewhat hazy possibly representing thrombus.  PDA contains proximal 80% stenosis. There is apical akinesis/dyskinesis with EF in the 35 to 40% range.  LVEDP is normal.   RECOMMENDATIONS:   T CTS consultation to determine if the patient has surgical options.  The LAD is not graftable and his distribution appears to be completely infarcted based upon the ventriculogram.  Grafting of the diagonal to obtuse marginals and PDA seems possible. If he is not a surgical candidate, there are interventional options for the right coronary which could be the patient's culprit for this presentation although symptoms are somewhat  vague and include palpitations.    ECHOCARDIOGRAM REPORT         Patient Name:   Ismaeel HO Biglow Date of Exam: 03/18/2020  Medical Rec #:  [id number]     Height:       68.0 in  Accession #:    [id number]    Weight:       145.3 lb  Date of Birth:  02-25-1951     BSA:          1.784 m  Patient Age:    17 years      BP:           132/64 mmHg  Patient Gender: M             HR:           95 bpm.  Exam Location:  Inpatient   Procedure: 2D Echo, Cardiac Doppler, Color Doppler and Intracardiac             Opacification Agent   Indications:    pre-op evaluation     History:        Patient has no prior history of Echocardiogram  examinations.                  CAD, Signs/Symptoms:Chest Pain; Risk Factors:Hypertension,                  Diabetes, Dyslipidemia and Former Smoker.     Sonographer:    Vickie Epley RDCS  Referring  Phys: [place]     1. LVEF is depressed with hypokinesis/akinesis of the distal inferior,  mid/distal septal, distal lataral and apical walls.. Left ventricular  ejection fraction, by estimation, is 35%. The left ventricle has  moderately decreased function. The left  ventricular internal cavity size was mildly to moderately dilated.   2. Right ventricular systolic function is normal. The right ventricular  size is normal.   3. The mitral valve is normal in structure. Trivial mitral valve  regurgitation.   4. The aortic valve is normal in structure. Aortic valve regurgitation is  not visualized.   5. The inferior vena cava is normal in size with greater than 50%  respiratory variability, suggesting right atrial pressure of 3 mmHg.   FINDINGS   Left Ventricle: LVEF is depressed with hypokinesis/akinesis of the distal  inferior, mid/distal septal, distal lataral and apical walls. Left  ventricular ejection fraction, by estimation, is 35%%. The left ventricle  has moderately decreased function. The   left ventricle demonstrates regional wall  motion abnormalities. Definity  contrast agent was given IV to delineate the left ventricular endocardial  borders. The left ventricular internal cavity size was mildly to  moderately dilated. There is no left  ventricular hypertrophy. Left ventricular diastolic parameters are  consistent with Grade I diastolic dysfunction (impaired relaxation).   Right Ventricle: The right ventricular size is normal. No increase in  right ventricular wall thickness. Right ventricular systolic function is  normal.   Left Atrium: Left atrial size was normal in size.   Right Atrium: Right atrial size was normal in size.   Pericardium: There is no evidence of pericardial effusion.   Mitral Valve: The mitral valve is normal in structure. Trivial mitral  valve regurgitation.   Tricuspid Valve: The tricuspid valve is normal in structure. Tricuspid  valve regurgitation is trivial.   Aortic Valve: The aortic valve is normal in structure. Aortic valve  regurgitation is not visualized.   Pulmonic Valve: The pulmonic valve was not well visualized. Pulmonic valve  regurgitation is not visualized.   Aorta: The aortic root is normal in size and structure.   Venous: The inferior vena cava is normal in size with greater than 50%  respiratory variability, suggesting right atrial pressure of 3 mmHg.   IAS/Shunts: No atrial level shunt detected by color flow Doppler.      LEFT VENTRICLE  PLAX 2D  LVIDd:         5.50 cm  LVIDs:         4.20 cm  LV PW:         0.90 cm  LV IVS:        0.90 cm  LVOT diam:     2.10 cm  LV SV:         59  LV SV Index:   33  LVOT Area:     3.46 cm     LV Volumes (MOD)  LV vol d, MOD A4C: 126.0 ml  LV vol s, MOD A4C: 93.6 ml  LV SV MOD A4C:     126.0 ml   RIGHT VENTRICLE  TAPSE (M-mode): 1.9 cm   LEFT ATRIUM             Index       RIGHT ATRIUM          Index  LA diam:        3.30 cm 1.85 cm/m  RA Area:  9.31 cm  LA Vol (A2C):   31.9 ml 17.88 ml/m RA Volume:    17.20 ml 9.64 ml/m  LA Vol (A4C):   26.8 ml 15.02 ml/m  LA Biplane Vol: 30.1 ml 16.87 ml/m   AORTIC VALVE  LVOT Vmax:   102.00 cm/s  LVOT Vmean:  65.400 cm/s  LVOT VTI:    0.171 m     AORTA  Ao Root diam: 3.30 cm      SHUNTS  Systemic VTI:  0.17 m  Systemic Diam: 2.10 cm   Dorris Carnes MD  Electronically signed by Dorris Carnes MD  Signature Date/Time: 03/18/2020/12:08:03 PM    Treatments:   OPERATIVE REPORT   DATE OF PROCEDURE:  03/25/2020   OPERATION: 1.  Coronary artery bypass grafting x3 (left internal mammary artery to first diagonal, saphenous vein graft to OM2, saphenous vein graft to posterior descending 2.  Endoscopic harvest of right leg greater saphenous vein.   SURGEON:  Ivin Poot, MD   ASSISTANT:  Enid Cutter PA-C.   ANESTHESIA:  General.   CLINICAL NOTE:  The patient is a 69 year old [name] male who was admitted to the hospital with a NSTEMI and severe LV dysfunction with EF 25%.  Echo showed no significant valvular disease.  Cardiac catheterization demonstrated severe multivessel coronary  disease.  The catheterization was performed by Dr. Daneen Schick.  The LAD was chronically occluded.  There was a high-grade proximal LAD stenosis proximal to a fairly large diagonal branch.  The RCA and high-grade 95-99% stenosis with a small but  graftable posterior descending.  The OM2 was small but graftable vessel with a stenosis of 90%.  The patient's LVEDP was 14 mmHg and he had no pulmonary edema on x-ray.   I discussed the results of the cardiac catheterization with the patient and his daughter and the recommendation by his cardiologist for coronary artery bypass grafting.  I agreed that this would be his best long-term therapy to improve survival, preserve  LV function, and to relieve symptoms of angina and heart failure.  I discussed with the patient and his daughter.  The risks of the surgery including the risks of stroke, bleeding, blood transfusion,  infection, organ failure, and death.  The patient  understood that because he was anemic preoperatively that there would be a likelihood that he would require transfusion products.  I discussed the details of the surgery with the patient's daughter who served as a Optometrist for her father, which  included the location of the surgical incisions, the use of general anesthesia and cardiopulmonary bypass, the postoperative recovery ICU, and the expected long-term benefits.  After reviewing these issues, the patient and his daughter appeared to  understand and agree and provided informed consent.    Discharge Exam: Blood pressure 110/68, pulse 86, temperature 98.1 F (36.7 C), temperature source Oral, resp. rate 20, height 5\' 8"  (1.727 m), weight 67.9 kg, SpO2 96 %.   General appearance: alert, cooperative and no distress Heart: sinus tachycardia Lungs: clear to auscultation bilaterally Abdomen: soft, non-tender; bowel sounds normal; no masses,  no organomegaly Extremities: extremities normal, atraumatic, no cyanosis or edema Wound: clean and dry  Disposition: Discharge disposition: 01-Home or Self Care         Allergies as of 03/30/2020   No Known Allergies      Medication List     STOP taking these medications    losartan-hydrochlorothiazide 50-12.5 MG tablet Commonly known as: HYZAAR   metoprolol succinate 25 MG  24 hr tablet Commonly known as: TOPROL-XL       TAKE these medications    aspirin EC 81 MG tablet Take 1 tablet (81 mg total) by mouth daily.   atorvastatin 80 MG tablet Commonly known as: LIPITOR Take 1 tablet (80 mg total) by mouth daily. What changed:  medication strength how much to take   clopidogrel 75 MG tablet Commonly known as: PLAVIX Take 75 mg by mouth daily.   furosemide 40 MG tablet Commonly known as: LASIX Take 1 tablet (40 mg total) by mouth daily.   glipiZIDE 5 MG tablet Commonly known as: GLUCOTROL Take 5 mg by mouth 2 (two)  times daily.   metFORMIN 500 MG tablet Commonly known as: GLUCOPHAGE Take 1 tablet (500 mg total) by mouth 2 (two) times daily for 28 days. What changed:  medication strength how much to take   metoprolol tartrate 25 MG tablet Commonly known as: LOPRESSOR Take 0.5 tablets (12.5 mg total) by mouth 2 (two) times daily.   oxyCODONE 5 MG immediate release tablet Commonly known as: Oxy IR/ROXICODONE Take 1 tablet (5 mg total) by mouth every 6 (six) hours as needed for severe pain.        Follow-up Information     Richardson Dopp T, PA-C. Go on 04/15/2020.   Specialties: Cardiology, Physician Assistant Why: Your Cardiology follow up appointment is at 11:45am on Friday, 04/15/20. Contact information: [address] [place] [address] [phone]         [name], Collier Salina, MD Follow up.   Specialty: Cardiothoracic Surgery Why: Your routine post-op appointment is on 04/27/2020 at 4pm. Please arrive at 3:30pm for a chest xray located at [facility] which is on the first floor of our building.  Contact information: [place] [address] [phone]         [name], MD. Call in 1 day(s).   Specialty: Family Medicine Contact information: [address] [place] [address] [phone]         nursing suture removal appointment Follow up.   Why: Your suture removal appointment is on 04/06/2020 at 11am.  Contact information: Dr. Lucianne Lei Trigt's office               The patient has been discharged on:   1.Beta Blocker:  Yes [ yes  ]                              No   [   ]                              If No, reason:  2.Ace Inhibitor/ARB: Yes [   ]                                     No  [  no  ]                                     If No, reason: BP well controlled  3.Statin:   Yes [ yes  ]  No  [   ]                  If No, reason:  4.Ecasa:  Yes  [ yes  ]                   No   [   ]                  If No, reason:     Signed: Elgie Collard 03/30/2020, 8:17 AM  patient examined and medical record reviewed,agree with above note. DC instructions reviewed with son, daughter Tharon Aquas Trigt III 04/05/2020

## 2020-03-25 NOTE — Progress Notes (Signed)
TCTS BRIEF SICU PROGRESS NOTE  1 Day Post-Op  S/P Procedure(s) (LRB): CORONARY ARTERY BYPASS GRAFTING (CABG), ON PUMP, TIMES THREE, USING LEFT INTERNAL MAMMARY ARTERY AND RIGHT ENDOSCOPICALLY HARVESTED GREATER SAPHENOUS VEIN (N/A) TRANSESOPHAGEAL ECHOCARDIOGRAM (TEE) (N/A)   Stable day NSR w/ stable BP on milrinone 0.25 Breathing comfortably w/ O2 satsy 94% UOP adequate  Plan: Continue current plan  Rexene Alberts, MD 03/25/2020 5:21 PM

## 2020-03-26 ENCOUNTER — Inpatient Hospital Stay (HOSPITAL_COMMUNITY): Payer: Medicare Other

## 2020-03-26 LAB — BASIC METABOLIC PANEL
Anion gap: 8 (ref 5–15)
BUN: 21 mg/dL (ref 8–23)
CO2: 23 mmol/L (ref 22–32)
Calcium: 8 mg/dL — ABNORMAL LOW (ref 8.9–10.3)
Chloride: 103 mmol/L (ref 98–111)
Creatinine, Ser: 1.22 mg/dL (ref 0.61–1.24)
GFR calc Af Amer: >60 mL/min (ref >60)
GFR calc non Af Amer: >60 mL/min (ref >60)
Glucose, Bld: 123 mg/dL — ABNORMAL HIGH (ref 70–99)
Potassium: 4.1 mmol/L (ref 3.5–5.1)
Sodium: 134 mmol/L — ABNORMAL LOW (ref 135–145)

## 2020-03-26 LAB — COOXEMETRY PANEL
Carboxyhemoglobin: 1.6 % — ABNORMAL HIGH (ref 0.5–1.5)
Methemoglobin: 0.7 % (ref 0.0–1.5)
O2 Saturation: 62.4 %
Total hemoglobin: 9.1 g/dL — ABNORMAL LOW (ref 12.0–16.0)

## 2020-03-26 LAB — GLUCOSE, CAPILLARY
Glucose-Capillary: 117 mg/dL — ABNORMAL HIGH (ref 70–99)
Glucose-Capillary: 118 mg/dL — ABNORMAL HIGH (ref 70–99)
Glucose-Capillary: 143 mg/dL — ABNORMAL HIGH (ref 70–99)
Glucose-Capillary: 119 mg/dL — ABNORMAL HIGH (ref 70–99)
Glucose-Capillary: 141 mg/dL — ABNORMAL HIGH (ref 70–99)

## 2020-03-26 LAB — CBC
HCT: 28.2 % — ABNORMAL LOW (ref 39.0–52.0)
Hemoglobin: 9 g/dL — ABNORMAL LOW (ref 13.0–17.0)
MCH: 27.8 pg (ref 26.0–34.0)
MCHC: 31.9 g/dL (ref 30.0–36.0)
MCV: 87 fL (ref 80.0–100.0)
Platelets: 140 10*3/uL — ABNORMAL LOW (ref 150–400)
RBC: 3.24 MIL/uL — ABNORMAL LOW (ref 4.22–5.81)
RDW: 17.1 % — ABNORMAL HIGH (ref 11.5–15.5)
WBC: 6.8 10*3/uL (ref 4.0–10.5)
nRBC: 0 % (ref 0.0–0.2)

## 2020-03-26 IMAGING — DX DG CHEST 1V PORT
1 series · 1 of 1 positions shown · non-contrast
Comparison: Chest radiograph [DATE]

CLINICAL DATA: Postop CABG

EXAM:
PORTABLE CHEST 1 VIEW

[chest ap]
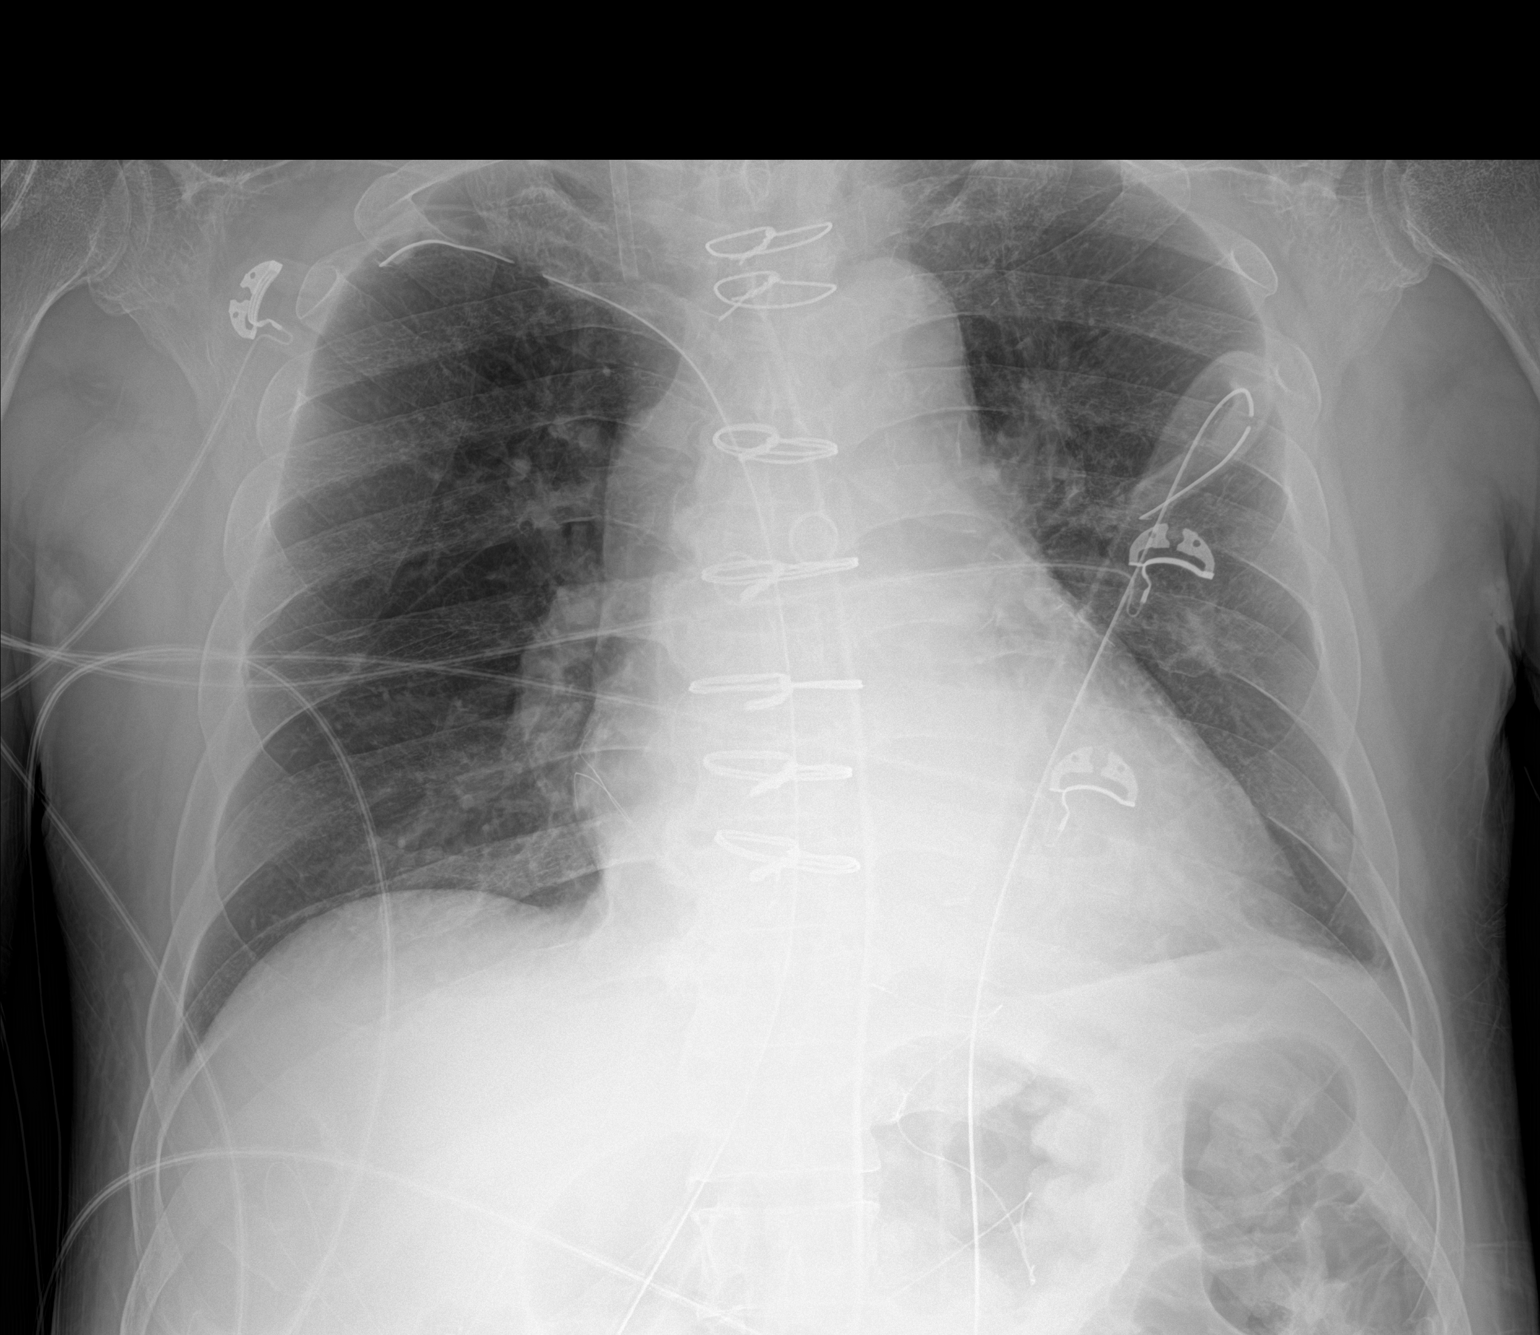

[1 of 1 positions shown; findings below may reference images not displayed]

FINDINGS: Stable cardiomediastinal contours status post median sternotomy and
CABG. Interval retraction of a right PA catheter. Otherwise stable
support apparatus. Mild left basilar atelectasis. The right lung is
clear. No pneumothorax or significant pleural effusion. Visualized
skeleton is unremarkable.
IMPRESSION: Mild left basilar atelectasis. No other acute finding.

## 2020-03-26 MED ORDER — ~~LOC~~ CARDIAC SURGERY, PATIENT & FAMILY EDUCATION: Freq: Once | Status: AC

## 2020-03-26 MED ORDER — INSULIN ASPART 100 UNIT/ML ~~LOC~~ SOLN
0.0000 [IU] | Freq: Three times a day (TID) | SUBCUTANEOUS | Status: DC
  Administered 2020-03-26 – 2020-03-27 (×3): 2 [IU] via SUBCUTANEOUS
  Administered 2020-03-27: 8 [IU] via SUBCUTANEOUS
  Administered 2020-03-27: 4 [IU] via SUBCUTANEOUS
  Administered 2020-03-28: 8 [IU] via SUBCUTANEOUS
  Administered 2020-03-28: 4 [IU] via SUBCUTANEOUS
  Administered 2020-03-28: 8 [IU] via SUBCUTANEOUS
  Administered 2020-03-29: 4 [IU] via SUBCUTANEOUS
  Administered 2020-03-29: 8 [IU] via SUBCUTANEOUS
  Administered 2020-03-29: 4 [IU] via SUBCUTANEOUS
  Administered 2020-03-29 – 2020-03-30 (×2): 2 [IU] via SUBCUTANEOUS

## 2020-03-26 NOTE — Op Note (Signed)
Jimmy Blankenship, Jimmy Blankenship [facility] MEDICAL RECORD [id number] ACCOUNT [id number] DATE OF BIRTH:1950/11/05 FACILITY: [facility] LOCATION: [facility] PHYSICIAN:Cieanna Stormes VAN TRIGT III, MD  OPERATIVE REPORT  DATE OF PROCEDURE:  03/25/2020  OPERATION: 1.  Coronary artery bypass grafting x3 (left internal mammary artery to first diagonal, saphenous vein graft to OM2, saphenous vein graft to posterior descending 2.  Endoscopic harvest of right leg greater saphenous vein.  SURGEON:  Ivin Poot, MD  ASSISTANT:  Enid Cutter PA-C.  ANESTHESIA:  General.  CLINICAL NOTE:  The patient is a 69 year old [name] male who was admitted to the hospital with a NSTEMI and severe LV dysfunction with EF 25%.  Echo showed no significant valvular disease.  Cardiac catheterization demonstrated severe multivessel coronary  disease.  The catheterization was performed by Dr. Daneen Schick.  The LAD was chronically occluded.  There was a high-grade proximal LAD stenosis proximal to a fairly large diagonal branch.  The RCA and high-grade 95-99% stenosis with a small but  graftable posterior descending.  The OM2 was small but graftable vessel with a stenosis of 90%.  The patient's LVEDP was 14 mmHg and he had no pulmonary edema on x-ray.  I discussed the results of the cardiac catheterization with the patient and his daughter and the recommendation by his cardiologist for coronary artery bypass grafting.  I agreed that this would be his best long-term therapy to improve survival, preserve  LV function, and to relieve symptoms of angina and heart failure.  I discussed with the patient and his daughter.  The risks of the surgery including the risks of stroke, bleeding, blood transfusion, infection, organ failure, and death.  The patient  understood that because he was anemic preoperatively that there would be a likelihood that he would require transfusion products.  I discussed the details of the surgery with the patient's daughter who  served as a Optometrist for her father, which  included the location of the surgical incisions, the use of general anesthesia and cardiopulmonary bypass, the postoperative recovery ICU, and the expected long-term benefits.  After reviewing these issues, the patient and his daughter appeared to  understand and agree and provided informed consent.  OPERATIVE FINDINGS: 1.  Small, but adequate conduit, small left IMA, but adequate flow. 2.  Severe scarring of the anterior LV wall and apex, but overall improved global LV function following revascularization. 3.  Intraoperative anemia requiring packed cell transfusion.  DESCRIPTION OF PROCEDURE:  The patient was placed supine on the operating table and general anesthesia was induced under invasive hemodynamic monitoring.  A transesophageal echo probe was placed.  The patient remained stable.  The chest, abdomen and legs  were prepped with Betadine, draped as a sterile field.  A proper time-out was performed.  A sternal incision was made as the saphenous vein was harvested endoscopically from the right leg.  The internal mammary artery was harvested as a pedicle graft from its origin at the subclavian vessels.  It was a 1.2 mm vessel with adequate flow.  The  sternal retractor was placed, and the pericardium was opened and suspended.  The aorta was inspected and palpated and had minimal calcification.  Pursestrings were placed in the ascending aorta and right atrium and after the vein had been harvested and  flushed and found to be adequate.  The patient was heparinized and cannulated and placed on cardiopulmonary bypass.  The coronaries were identified for grafting.  The mammary artery and vein grafts were prepared for the distal anastomoses.  Cardioplegic  cannulas were placed both antegrade and retrograde cold blood cardioplegia.  The patient was cooled to 32 degrees, and the aortic crossclamp was applied.  One liter of cold blood cardioplegia was  delivered in split doses between the antegrade aortic and  retrograde coronary sinus catheters.  There was good cardioplegic arrest and supple temperature dropped less than 14.  Cardioplegia was delivered every 20 minutes.  The distal coronary anastomoses were performed.  First, distal anastomosis was to the posterior descending branch of the right coronary.  This was a 1.2 mm vessel, proximal 90% stenosis.  Reverse saphenous vein was sewn end-to-side with running 7-0  Prolene with good flow through the graft.  Cardioplegia was redosed.  The second distal anastomosis was to the OM branch of the circumflex.  The vessel was 1.0 mm, but graftable.  A reverse saphenous vein was sewn end-to-side with running 8-0 Prolene.  There was good flow through the graft.  Cardioplegia was redosed.  The third distal anastomosis was the diagonal branch of LAD, which was chronically occluded and atretic.  The left IMA pedicle was brought through an opening in the pericardium and was brought down onto the diagonal and sewn end-to-side with running 8-0  Prolene.  There was good flow through the anastomosis after briefly releasing the pedicle bulldog and the mammary artery.  The bulldog was replaced and the patient in the pedicle secured to the epicardium.  Cardioplegia was then redosed.  Cross clamp was still in place, 2 proximal vein anastomoses were performed on the ascending aorta using a 4.5 mm punch and running 6-0 Prolene.  Prior to tying down the final proximal anastomosis, air was vented from the coronaries with a dose of  retrograde warm blood cardioplegia.  The crossclamp was then removed.  The heart resumed a spontaneous rhythm.  The vein grafts were de-aired and opened and each had good flow, and hemostasis was documented at the proximal and distal sites.  The mammary artery distal anastomosis was hemostatic.  The patient was rewarmed and reperfused and temporary pacing wires were applied.  Low dose inotropes  including milrinone and norepinephrine was started in preparation for separation from cardiopulmonary bypass.  After the ventilator was resumed, the  patient was weaned off cardiopulmonary bypass without difficulty with stable hemodynamics and improved global LV function.  Protamine was administered without adverse reaction.  The cannula was removed.  The patient had some coagulopathy and received  some FFP with improved coagulation function.  The superior pericardial fat was closed over the aorta.  The anterior mediastinum and bilateral chest tubes were placed and brought out through separate incisions.  The sternum was closed with #7 wire.  The  patient remained stable.  The pectoralis fascia was closed with a running #1 Vicryl.  Subcutaneous and skin layers were closed with running Vicryl and sterile dressings were applied.  Total cardiopulmonary bypass time was 128 minutes.  PN/NUANCE  D:03/25/2020 T:03/26/2020 JOB:[id number]

## 2020-03-26 NOTE — Progress Notes (Addendum)
      [address]       [place] [address]             [phone]        CARDIOTHORACIC SURGERY PROGRESS NOTE   R2 Days Post-Op Procedure(s) (LRB): CORONARY ARTERY BYPASS GRAFTING (CABG), ON PUMP, TIMES THREE, USING LEFT INTERNAL MAMMARY ARTERY AND RIGHT ENDOSCOPICALLY HARVESTED GREATER SAPHENOUS VEIN (N/A) TRANSESOPHAGEAL ECHOCARDIOGRAM (TEE) (N/A)  Subjective: Looks good.  Some soreness in chest.  No SOB.  Objective: Vital signs: BP Readings from Last 1 Encounters:  03/26/20 112/62   Pulse Readings from Last 1 Encounters:  03/26/20 81   Resp Readings from Last 1 Encounters:  03/26/20 15   Temp Readings from Last 1 Encounters:  03/26/20 99.1 F (37.3 C)    Hemodynamics:   Mixed venous co-ox 62%   Physical Exam:  Rhythm:   sinus  Breath sounds: clear  Heart sounds:  RRR  Incisions:  Dressing dry intact  Abdomen:  Soft, non-distended, non-tender  Extremities:  Warm, well-perfused  Chest tubes:  low volume thin serosanguinous output, no air leak    Intake/Output from previous day: 07/16 0701 - 07/17 0700 In: 1411.1 [P.O.:540; I.V.:670.8; IV Piggyback:200.4] Out: 1805 [Urine:1655; Chest Tube:150] Intake/Output this shift: Total I/O In: 60 [P.O.:60] Out: -   Lab Results:  CBC: Recent Labs    03/25/20 1635 03/26/20 0418  WBC 14.0* 6.8  HGB 10.0* 9.0*  HCT 31.3* 28.2*  PLT 174 140*    BMET:  Recent Labs    03/25/20 1635 03/26/20 0418  NA 134* 134*  K 4.7 4.1  CL 104 103  CO2 21* 23  GLUCOSE 177* 123*  BUN 18 21  CREATININE 1.28* 1.22  CALCIUM 8.3* 8.0*     PT/INR:   Recent Labs    03/24/20 1408  LABPROT 16.2*  INR 1.4*    CBG (last 3)  Recent Labs    03/25/20 2312 03/26/20 0421 03/26/20 0740  GLUCAP 137* 117* 118*    ABG    Component Value Date/Time   PHART 7.319 (L) 03/25/2020 0639   PCO2ART 39.1 03/25/2020 0639   PO2ART 90 03/25/2020 0639   HCO3 19.9 (L) 03/25/2020 0639   TCO2 21 (L) 03/25/2020  0639   ACIDBASEDEF 6.0 (H) 03/25/2020 0639   O2SAT 62.4 03/26/2020 0418    CXR: clear  Assessment/Plan: S/P Procedure(s) (LRB): CORONARY ARTERY BYPASS GRAFTING (CABG), ON PUMP, TIMES THREE, USING LEFT INTERNAL MAMMARY ARTERY AND RIGHT ENDOSCOPICALLY HARVESTED GREATER SAPHENOUS VEIN (N/A) TRANSESOPHAGEAL ECHOCARDIOGRAM (TEE) (N/A)  Doing well POD2 Maintaining NSR w/ stable BP, co-ox 62% on milrinone 0.25 Breathing comfortably on room air, CXR clear Expected post op acute blood loss anemia, mild Expected post op volume excess, weight reportedly 6 kg > preop, UOP adequate Expected post op atelectasis, very mild   Mobilize  D/C tubes  Wean milrinone slowly  Diuresis   Rexene Alberts, MD 03/26/2020 11:02 AM

## 2020-03-26 NOTE — Progress Notes (Signed)
[facility] BRIEF SICU PROGRESS NOTE  2 Days Post-Op  S/P Procedure(s) (LRB): CORONARY ARTERY BYPASS GRAFTING (CABG), ON PUMP, TIMES THREE, USING LEFT INTERNAL MAMMARY ARTERY AND RIGHT ENDOSCOPICALLY HARVESTED GREATER SAPHENOUS VEIN (N/A) TRANSESOPHAGEAL ECHOCARDIOGRAM (TEE) (N/A)   Stable day  Plan: Continue current plan  Rexene Alberts, MD 03/26/2020 5:27 PM

## 2020-03-27 ENCOUNTER — Inpatient Hospital Stay (HOSPITAL_COMMUNITY): Payer: Medicare Other

## 2020-03-27 LAB — CBC
HCT: 28.9 % — ABNORMAL LOW (ref 39.0–52.0)
Hemoglobin: 9.1 g/dL — ABNORMAL LOW (ref 13.0–17.0)
MCH: 27.2 pg (ref 26.0–34.0)
MCHC: 31.5 g/dL (ref 30.0–36.0)
MCV: 86.3 fL (ref 80.0–100.0)
Platelets: 171 10*3/uL (ref 150–400)
RBC: 3.35 MIL/uL — ABNORMAL LOW (ref 4.22–5.81)
RDW: 16.6 % — ABNORMAL HIGH (ref 11.5–15.5)
WBC: 6 10*3/uL (ref 4.0–10.5)
nRBC: 0 % (ref 0.0–0.2)

## 2020-03-27 LAB — TYPE AND SCREEN
ABO/RH(D): A POS
Antibody Screen: NEGATIVE
Unit division: 0
Unit division: 0
Unit division: 0
Unit division: 0
Unit division: 0
Unit division: 0
Unit division: 0
Unit division: 0

## 2020-03-27 LAB — BASIC METABOLIC PANEL
Anion gap: 9 (ref 5–15)
BUN: 19 mg/dL (ref 8–23)
CO2: 25 mmol/L (ref 22–32)
Calcium: 7.7 mg/dL — ABNORMAL LOW (ref 8.9–10.3)
Chloride: 100 mmol/L (ref 98–111)
Creatinine, Ser: 1.12 mg/dL (ref 0.61–1.24)
GFR calc Af Amer: >60 mL/min (ref >60)
GFR calc non Af Amer: >60 mL/min (ref >60)
Glucose, Bld: 97 mg/dL (ref 70–99)
Potassium: 3.7 mmol/L (ref 3.5–5.1)
Sodium: 134 mmol/L — ABNORMAL LOW (ref 135–145)

## 2020-03-27 LAB — BPAM RBC
Blood Product Expiration Date: 202108032359
Blood Product Expiration Date: 202108082359
Blood Product Expiration Date: 202108082359
Blood Product Expiration Date: 202108082359
Blood Product Expiration Date: 202108082359
Blood Product Expiration Date: 202108082359
Blood Product Expiration Date: 202108082359
Blood Product Expiration Date: 202108112359
ISSUE DATE / TIME: 202107121218
ISSUE DATE / TIME: 202107150838
ISSUE DATE / TIME: 202107150838
ISSUE DATE / TIME: 202107150838
ISSUE DATE / TIME: 202107150838
ISSUE DATE / TIME: 202107151140
ISSUE DATE / TIME: 202107151140
Unit Type and Rh: 6200
Unit Type and Rh: 6200
Unit Type and Rh: 6200
Unit Type and Rh: 6200
Unit Type and Rh: 6200
Unit Type and Rh: 6200
Unit Type and Rh: 6200
Unit Type and Rh: 6200

## 2020-03-27 LAB — GLUCOSE, CAPILLARY
Glucose-Capillary: 102 mg/dL — ABNORMAL HIGH (ref 70–99)
Glucose-Capillary: 163 mg/dL — ABNORMAL HIGH (ref 70–99)
Glucose-Capillary: 121 mg/dL — ABNORMAL HIGH (ref 70–99)
Glucose-Capillary: 228 mg/dL — ABNORMAL HIGH (ref 70–99)

## 2020-03-27 LAB — COOXEMETRY PANEL
Carboxyhemoglobin: 1.3 % (ref 0.5–1.5)
Methemoglobin: 0.7 % (ref 0.0–1.5)
O2 Saturation: 61.5 %
Total hemoglobin: 9.3 g/dL — ABNORMAL LOW (ref 12.0–16.0)

## 2020-03-27 IMAGING — CR DG CHEST 2V
2 series · 2 of 2 positions shown · non-contrast
Comparison: Chest radiograph [DATE]

CLINICAL DATA: Atelectasis

EXAM:
CHEST - 2 VIEW

[chest pa]
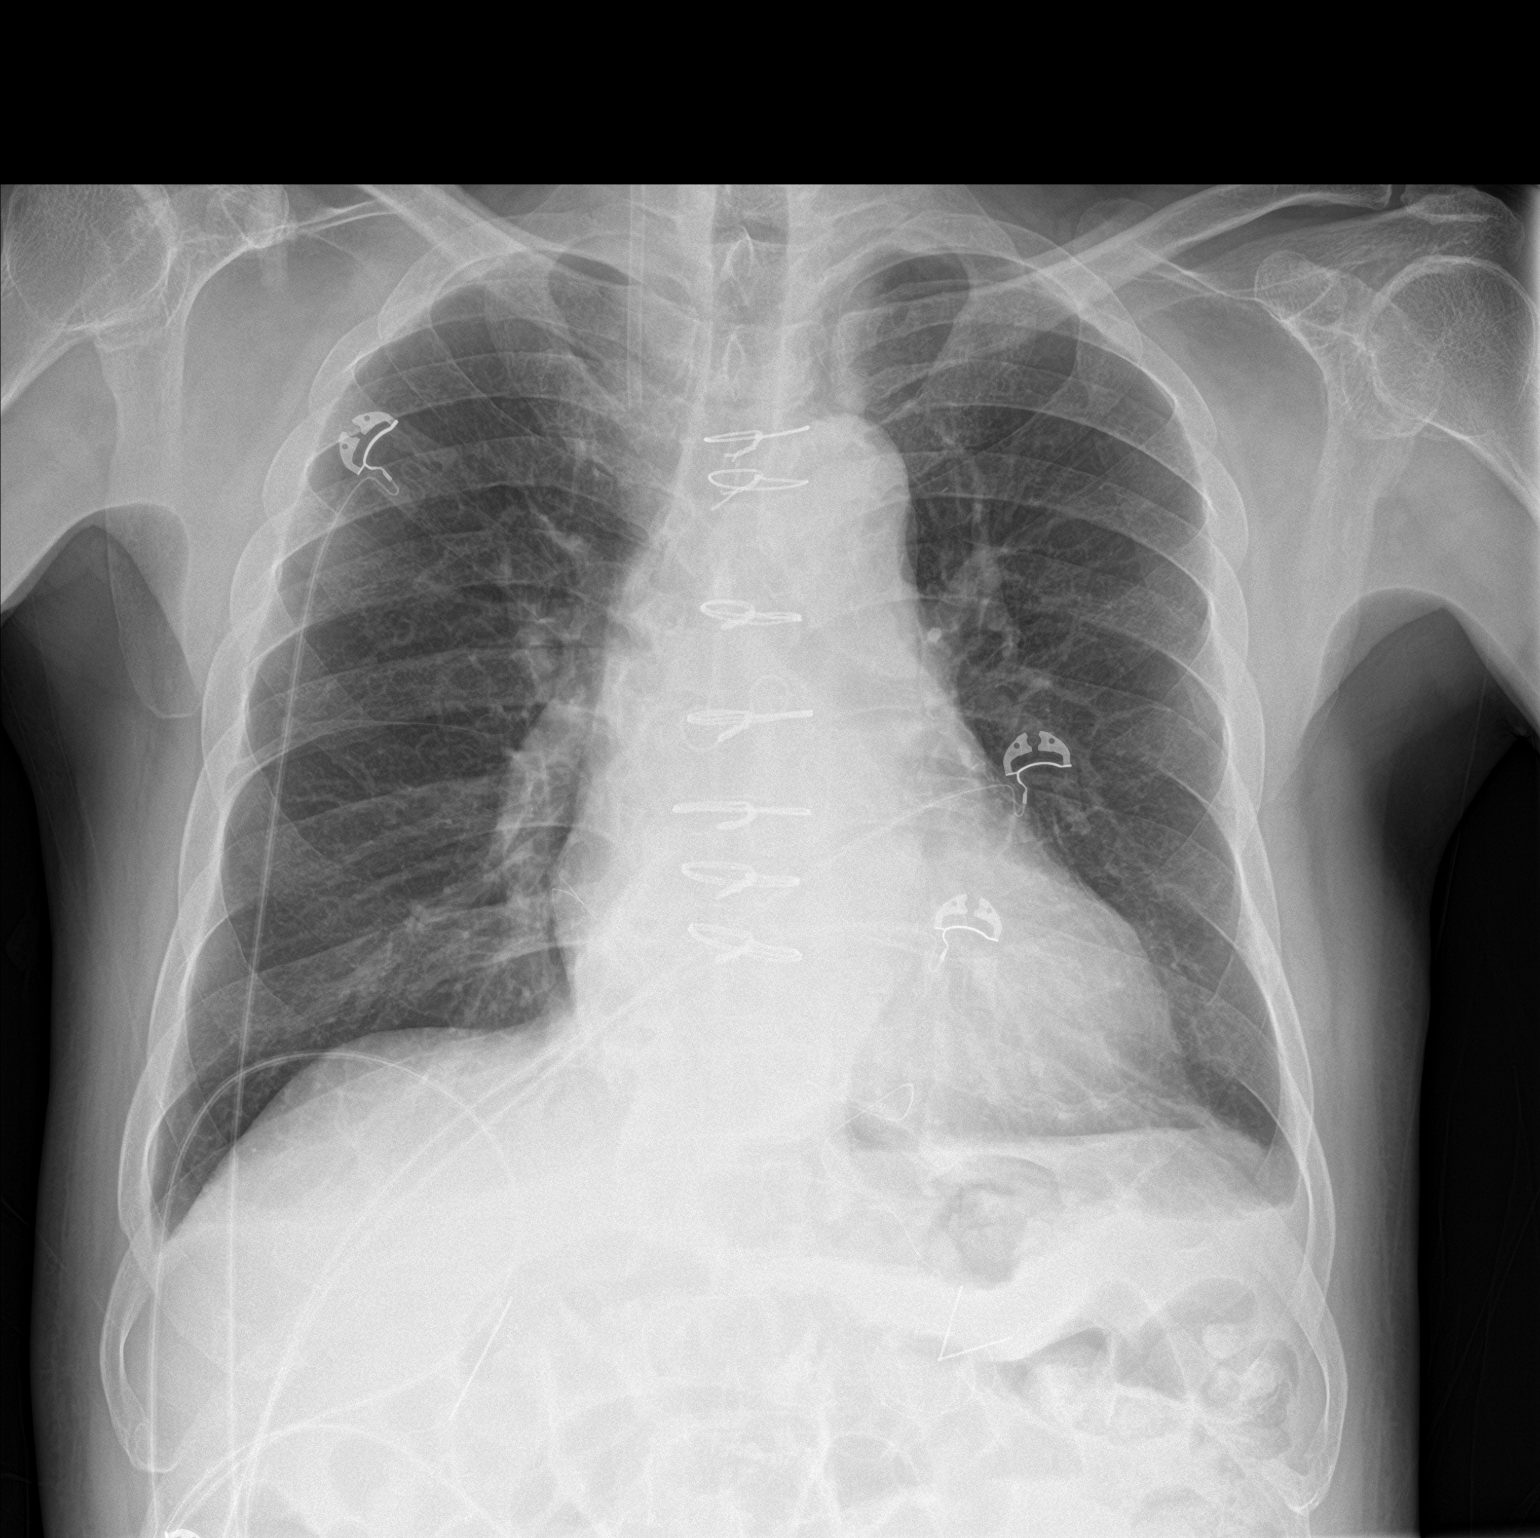

[chest lat]
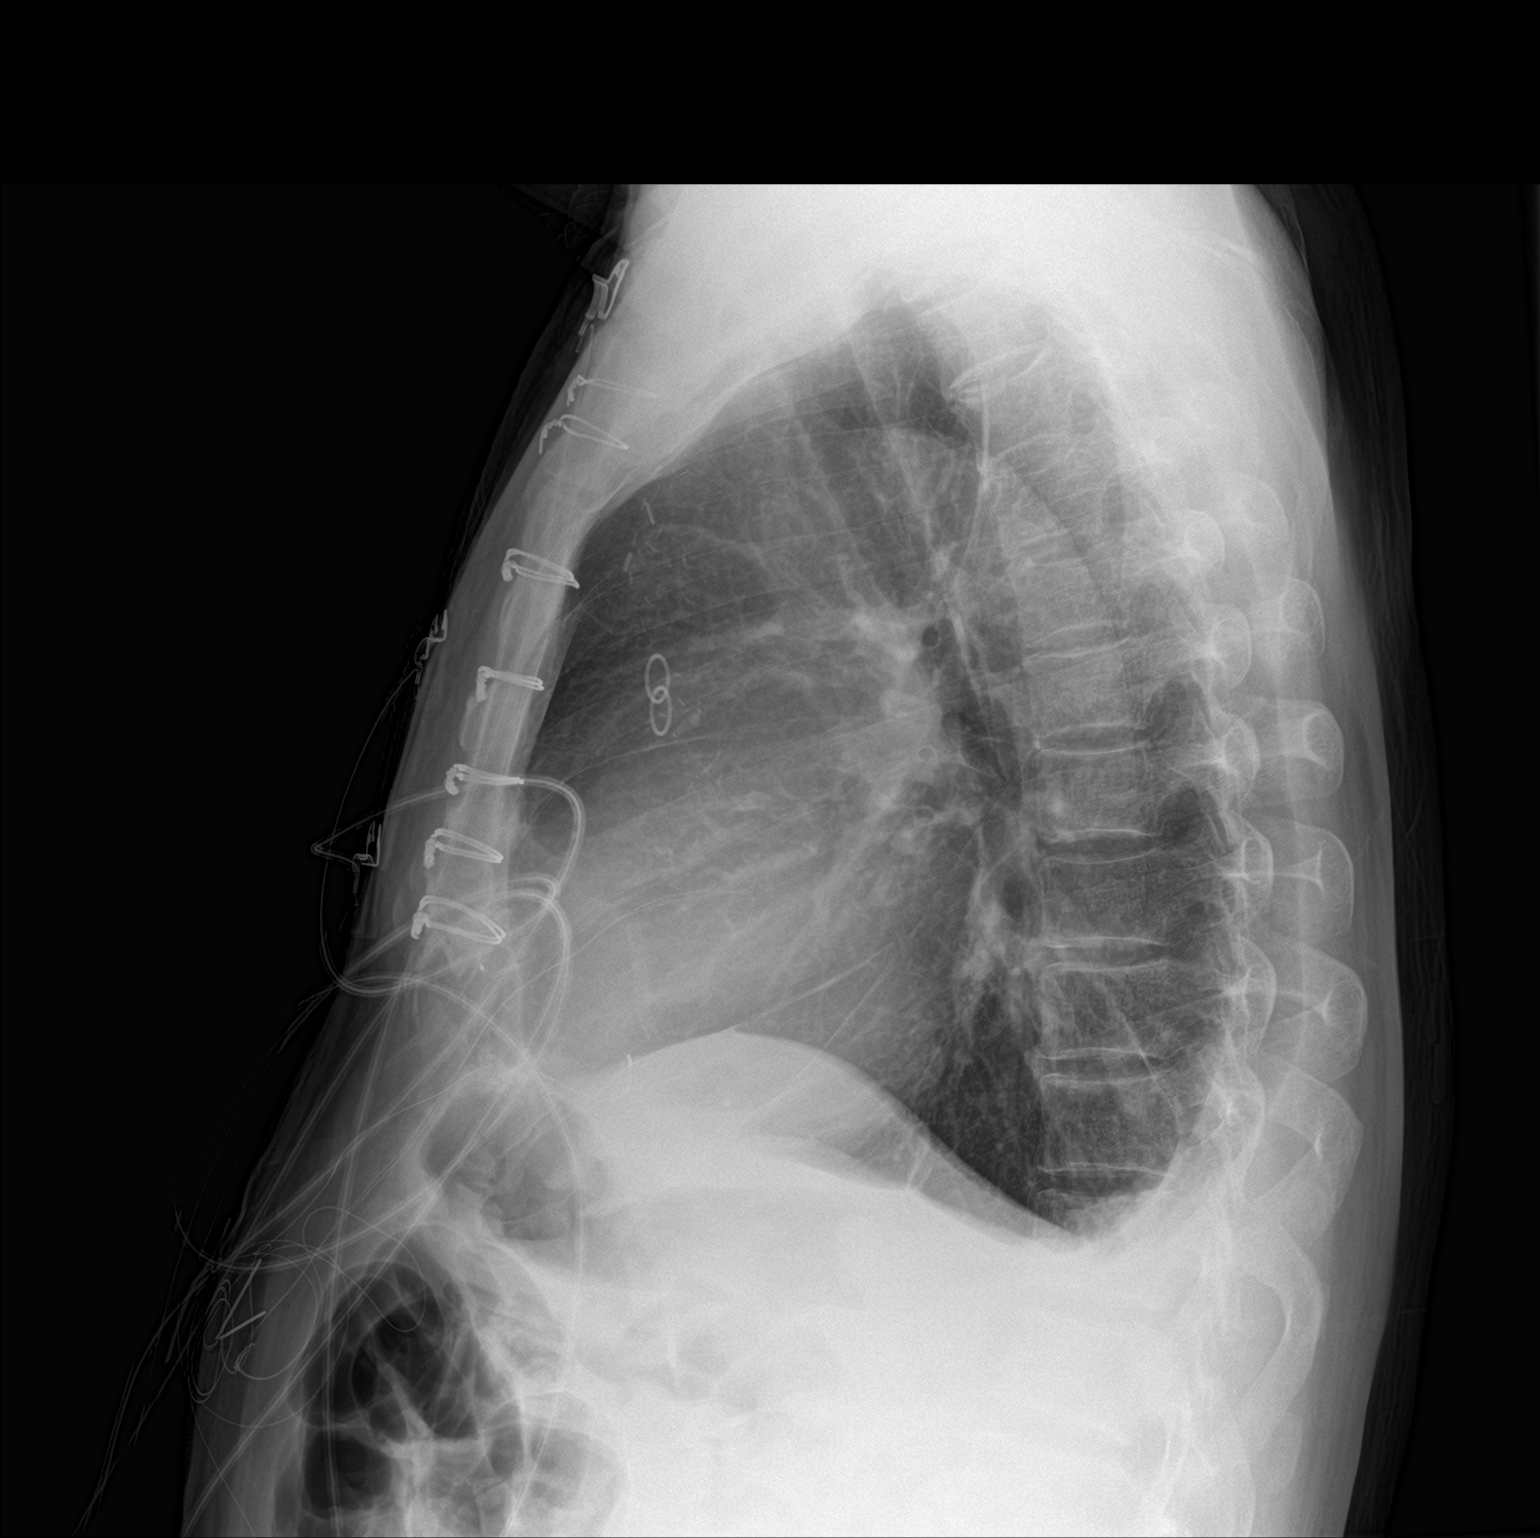

[2 of 2 positions shown; findings below may reference images not displayed]

FINDINGS: Stable cardiomediastinal contours status post median sternotomy and
CABG. Interval removal of bilateral chest tubes and mediastinal
drain. Minimal left basilar atelectasis. Right lung is clear. Trace
bilateral pleural effusions. No pneumothorax. No acute finding in
the visualized skeleton.
IMPRESSION: Minimal left basilar atelectasis and trace bilateral pleural
effusions.

## 2020-03-27 MED ORDER — FUROSEMIDE 40 MG PO TABS
40.0000 mg | ORAL_TABLET | Freq: Every day | ORAL | Status: DC
  Administered 2020-03-27 – 2020-03-30 (×4): 40 mg via ORAL
  Filled 2020-03-27 (×4): qty 1

## 2020-03-27 MED ORDER — POTASSIUM CHLORIDE 10 MEQ/50ML IV SOLN
10.0000 meq | INTRAVENOUS | Status: AC
  Administered 2020-03-27 (×3): 10 meq via INTRAVENOUS
  Filled 2020-03-27 (×3): qty 50

## 2020-03-27 MED ORDER — POTASSIUM CHLORIDE CRYS ER 20 MEQ PO TBCR
20.0000 meq | EXTENDED_RELEASE_TABLET | Freq: Every day | ORAL | Status: DC
  Administered 2020-03-27 – 2020-03-30 (×3): 20 meq via ORAL
  Filled 2020-03-27 (×3): qty 1

## 2020-03-27 NOTE — Progress Notes (Signed)
      [address]       [place] [address]             [phone]        CARDIOTHORACIC SURGERY PROGRESS NOTE   R3 Days Post-Op Procedure(s) (LRB): CORONARY ARTERY BYPASS GRAFTING (CABG), ON PUMP, TIMES THREE, USING LEFT INTERNAL MAMMARY ARTERY AND RIGHT ENDOSCOPICALLY HARVESTED GREATER SAPHENOUS VEIN (N/A) TRANSESOPHAGEAL ECHOCARDIOGRAM (TEE) (N/A)  Subjective: Looks and feels well.  Ambulating well on room air  Objective: Vital signs: BP Readings from Last 1 Encounters:  03/27/20 131/85   Pulse Readings from Last 1 Encounters:  03/27/20 89   Resp Readings from Last 1 Encounters:  03/27/20 (!) 21   Temp Readings from Last 1 Encounters:  03/27/20 98.2 F (36.8 C)    Hemodynamics:   Mixed venous co-ox 61%   Physical Exam:  Rhythm:   sinus  Breath sounds: clear  Heart sounds:  RRR  Incisions:  Clean and dry  Abdomen:  Soft, non-distended, non-tender  Extremities:  Warm, well-perfused    Intake/Output from previous day: 07/17 0701 - 07/18 0700 In: 607.1 [P.O.:420; I.V.:187.1] Out: 2250 [Urine:2250] Intake/Output this shift: Total I/O In: 340 [P.O.:240; IV Piggyback:100] Out: 675 [Urine:675]  Lab Results:  CBC: Recent Labs    03/26/20 0418 03/27/20 0336  WBC 6.8 6.0  HGB 9.0* 9.1*  HCT 28.2* 28.9*  PLT 140* 171    BMET:  Recent Labs    03/26/20 0418 03/27/20 0336  NA 134* 134*  K 4.1 3.7  CL 103 100  CO2 23 25  GLUCOSE 123* 97  BUN 21 19  CREATININE 1.22 1.12  CALCIUM 8.0* 7.7*     PT/INR:   Recent Labs    03/24/20 1408  LABPROT 16.2*  INR 1.4*    CBG (last 3)  Recent Labs    03/26/20 1548 03/26/20 2221 03/27/20 0636  GLUCAP 119* 141* 102*    ABG    Component Value Date/Time   PHART 7.319 (L) 03/25/2020 0639   PCO2ART 39.1 03/25/2020 0639   PO2ART 90 03/25/2020 0639   HCO3 19.9 (L) 03/25/2020 0639   TCO2 21 (L) 03/25/2020 0639   ACIDBASEDEF 6.0 (H) 03/25/2020 0639   O2SAT 61.5 03/27/2020 0336      CXR: Clear.  Tiny pleural effusions  Assessment/Plan: S/P Procedure(s) (LRB): CORONARY ARTERY BYPASS GRAFTING (CABG), ON PUMP, TIMES THREE, USING LEFT INTERNAL MAMMARY ARTERY AND RIGHT ENDOSCOPICALLY HARVESTED GREATER SAPHENOUS VEIN (N/A) TRANSESOPHAGEAL ECHOCARDIOGRAM (TEE) (N/A)  Doing well POD3 Maintaining NSR w/ stable BP, co-ox 61% off milrinone Breathing comfortably on room air, CXR clear Expected post op acute blood loss anemia, mild Expected post op volume excess, weight reportedly 6 kg > preop, UOP adequate Expected post op atelectasis, very mild   Mobilize  Diuresis  Transfer PCU   Rexene Alberts, MD 03/27/2020 11:10 AM

## 2020-03-28 ENCOUNTER — Inpatient Hospital Stay (HOSPITAL_COMMUNITY): Payer: Medicare Other

## 2020-03-28 LAB — CBC
HCT: 31.5 % — ABNORMAL LOW (ref 39.0–52.0)
Hemoglobin: 9.8 g/dL — ABNORMAL LOW (ref 13.0–17.0)
MCH: 26.6 pg (ref 26.0–34.0)
MCHC: 31.1 g/dL (ref 30.0–36.0)
MCV: 85.6 fL (ref 80.0–100.0)
Platelets: 233 10*3/uL (ref 150–400)
RBC: 3.68 MIL/uL — ABNORMAL LOW (ref 4.22–5.81)
RDW: 16.3 % — ABNORMAL HIGH (ref 11.5–15.5)
WBC: 5.4 10*3/uL (ref 4.0–10.5)
nRBC: 0 % (ref 0.0–0.2)

## 2020-03-28 LAB — GLUCOSE, CAPILLARY
Glucose-Capillary: 111 mg/dL — ABNORMAL HIGH (ref 70–99)
Glucose-Capillary: 121 mg/dL — ABNORMAL HIGH (ref 70–99)
Glucose-Capillary: 183 mg/dL — ABNORMAL HIGH (ref 70–99)
Glucose-Capillary: 223 mg/dL — ABNORMAL HIGH (ref 70–99)
Glucose-Capillary: 225 mg/dL — ABNORMAL HIGH (ref 70–99)
Glucose-Capillary: 229 mg/dL — ABNORMAL HIGH (ref 70–99)
Glucose-Capillary: 94 mg/dL (ref 70–99)

## 2020-03-28 LAB — BASIC METABOLIC PANEL
Anion gap: 8 (ref 5–15)
BUN: 13 mg/dL (ref 8–23)
CO2: 28 mmol/L (ref 22–32)
Calcium: 8.2 mg/dL — ABNORMAL LOW (ref 8.9–10.3)
Chloride: 102 mmol/L (ref 98–111)
Creatinine, Ser: 1.19 mg/dL (ref 0.61–1.24)
GFR calc Af Amer: >60 mL/min (ref >60)
GFR calc non Af Amer: >60 mL/min (ref >60)
Glucose, Bld: 116 mg/dL — ABNORMAL HIGH (ref 70–99)
Potassium: 3.6 mmol/L (ref 3.5–5.1)
Sodium: 138 mmol/L (ref 135–145)

## 2020-03-28 IMAGING — DX DG CHEST 1V PORT
1 series · 1 of 1 positions shown · non-contrast
Comparison: [DATE]

CLINICAL DATA: Status post coronary bypass grafting

EXAM:
PORTABLE CHEST 1 VIEW

[chest ap]
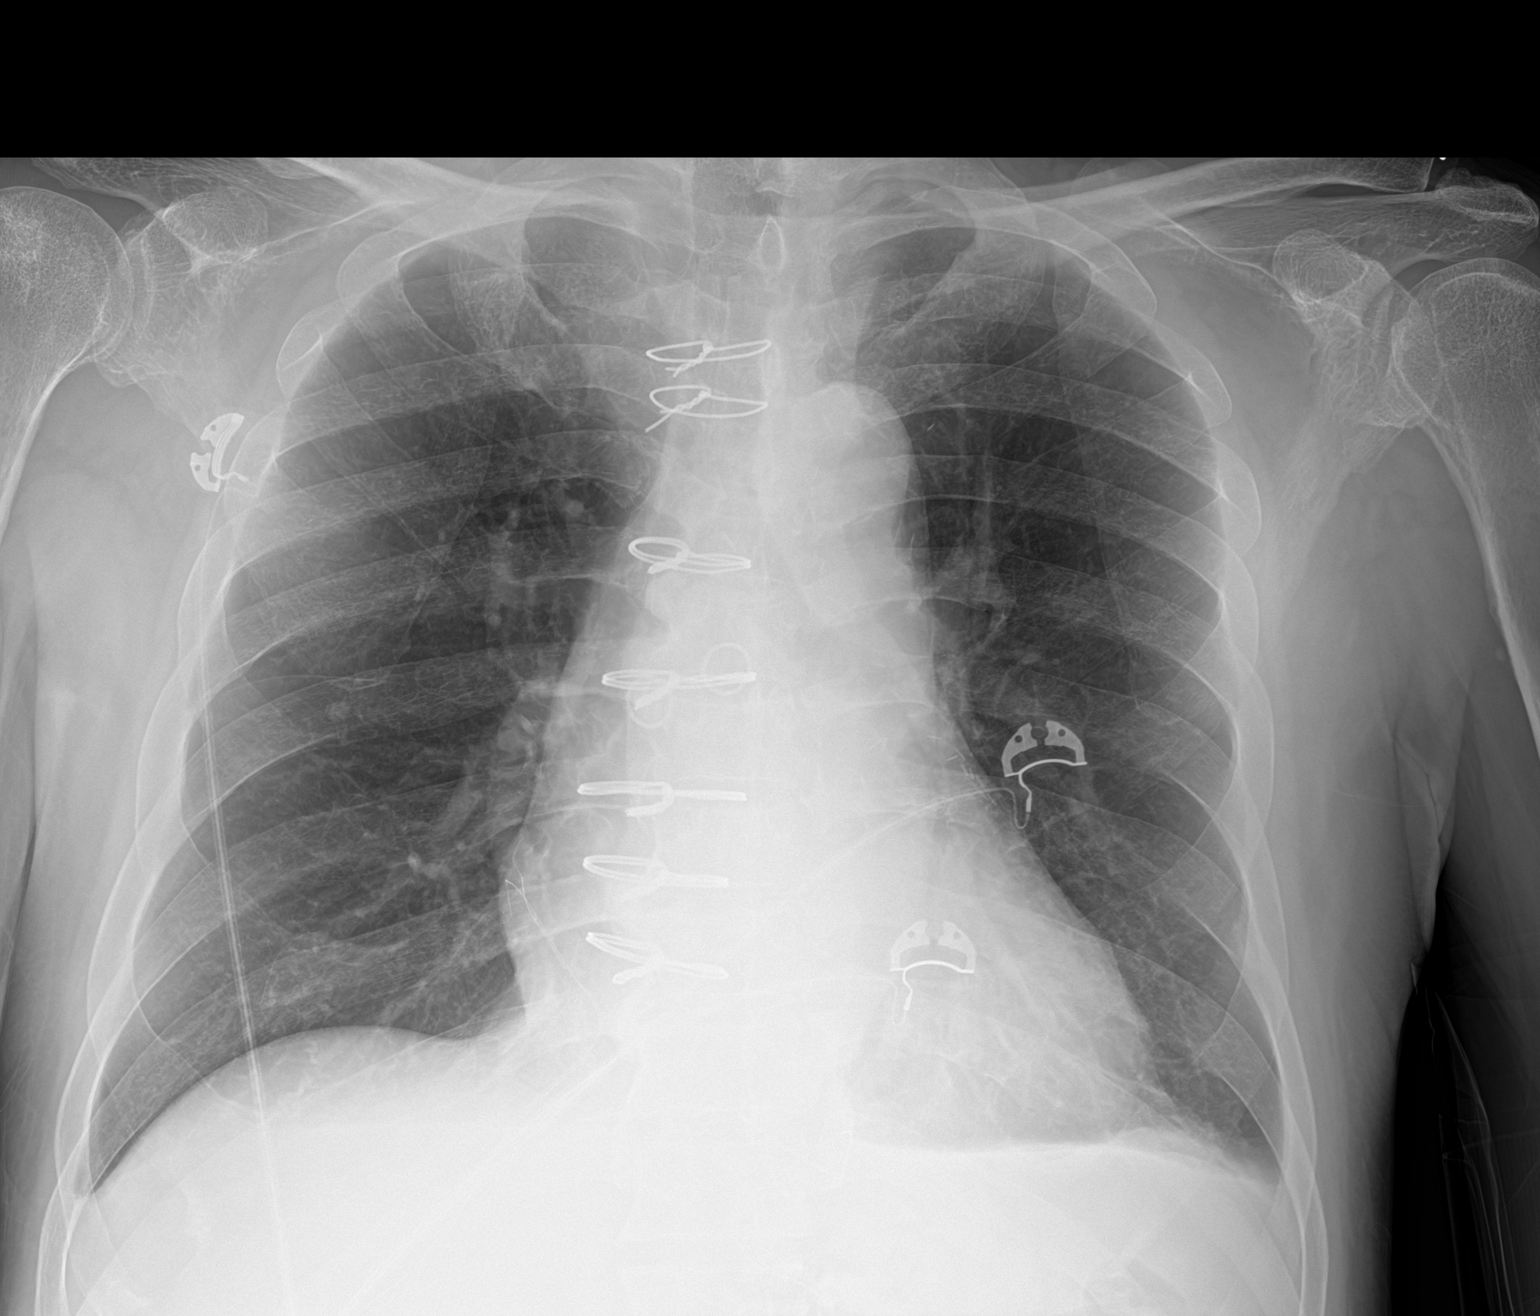

[1 of 1 positions shown; findings below may reference images not displayed]

FINDINGS: Cardiac shadow is stable. Postsurgical changes are again seen. Right
jugular sheath is been removed in the interval. The lungs are well
aerated bilaterally. Stable left pleural effusion is noted. No focal
infiltrate is seen.
IMPRESSION: No significant change from the prior day.

## 2020-03-28 MED ORDER — POTASSIUM CHLORIDE CRYS ER 20 MEQ PO TBCR
20.0000 meq | EXTENDED_RELEASE_TABLET | ORAL | Status: AC
  Administered 2020-03-28 (×3): 20 meq via ORAL
  Filled 2020-03-28 (×3): qty 1

## 2020-03-28 NOTE — Progress Notes (Signed)
CARDIAC REHAB PHASE I   PRE:  Rate/Rhythm: 72 SR with PVCs  BP:  Sitting: 109/81      SaO2: 95 RA  MODE:  Ambulation: 1100 ft   POST:  Rate/Rhythm: 99 SR with PVCs  BP:  Sitting: 130/69    SaO2: 100 RA  Pt ambulated >1030ft in hallway standby assist with EVA. Pt denies CP or SOB. Encouraged continued ambulation and IS use. Will continue to follow.  [id number] Rufina Falco, RN BSN 03/28/2020 10:52 AM

## 2020-03-28 NOTE — Progress Notes (Signed)
4 Days Post-Op Procedure(s) (LRB): CORONARY ARTERY BYPASS GRAFTING (CABG), ON PUMP, TIMES THREE, USING LEFT INTERNAL MAMMARY ARTERY AND RIGHT ENDOSCOPICALLY HARVESTED GREATER SAPHENOUS VEIN (N/A) TRANSESOPHAGEAL ECHOCARDIOGRAM (TEE) (N/A) Subjective: Waiting for stepdown bed CXR clear nsr  Objective: Vital signs in last 24 hours: Temp:  [98 F (36.7 C)-98.4 F (36.9 C)] 98.3 F (36.8 C) (07/19 0343) Pulse Rate:  [68-104] 97 (07/19 0700) Cardiac Rhythm: Normal sinus rhythm (07/19 0000) Resp:  [13-26] 15 (07/19 0700) BP: (87-154)/(52-115) 108/85 (07/19 0700) SpO2:  [92 %-100 %] 98 % (07/19 0700) Weight:  [69.2 kg] 69.2 kg (07/19 0500)  Hemodynamic parameters for last 24 hours:    Intake/Output from previous day: 07/18 0701 - 07/19 0700 In: 943 [P.O.:840; I.V.:3; IV Piggyback:100] Out: 3790 [Urine:3790] Intake/Output this shift: No intake/output data recorded.      Exam    General- alert and comfortable. Sternal incision clean    Neck- no JVD, no cervical adenopathy palpable, no carotid bruit   Lungs- clear without rales, wheezes   Cor- regular rate and rhythm, no murmur , gallop   Abdomen- soft, non-tender   Extremities - warm, non-tender, minimal edema   Neuro- oriented, appropriate, no focal weakness   Lab Results: Recent Labs    03/27/20 0336 03/28/20 0241  WBC 6.0 5.4  HGB 9.1* 9.8*  HCT 28.9* 31.5*  PLT 171 233   BMET:  Recent Labs    03/27/20 0336 03/28/20 0241  NA 134* 138  K 3.7 3.6  CL 100 102  CO2 25 28  GLUCOSE 97 116*  BUN 19 13  CREATININE 1.12 1.19  CALCIUM 7.7* 8.2*    PT/INR: No results for input(s): LABPROT, INR in the last 72 hours. ABG    Component Value Date/Time   PHART 7.319 (L) 03/25/2020 0639   HCO3 19.9 (L) 03/25/2020 0639   TCO2 21 (L) 03/25/2020 0639   ACIDBASEDEF 6.0 (H) 03/25/2020 0639   O2SAT 61.5 03/27/2020 0336   CBG (last 3)  Recent Labs    03/27/20 2218 03/27/20 2356 03/28/20 0618  GLUCAP 228* 229* 94     Assessment/Plan: S/P Procedure(s) (LRB): CORONARY ARTERY BYPASS GRAFTING (CABG), ON PUMP, TIMES THREE, USING LEFT INTERNAL MAMMARY ARTERY AND RIGHT ENDOSCOPICALLY HARVESTED GREATER SAPHENOUS VEIN (N/A) TRANSESOPHAGEAL ECHOCARDIOGRAM (TEE) (N/A) Mobilize Diabetes control Plan for transfer to step-down: see transfer orders DC EPWs tomorrow  LOS: 10 days    Jimmy Blankenship 03/28/2020

## 2020-03-29 LAB — CBC
HCT: 31.8 % — ABNORMAL LOW (ref 39.0–52.0)
Hemoglobin: 10 g/dL — ABNORMAL LOW (ref 13.0–17.0)
MCH: 27.4 pg (ref 26.0–34.0)
MCHC: 31.4 g/dL (ref 30.0–36.0)
MCV: 87.1 fL (ref 80.0–100.0)
Platelets: 282 10*3/uL (ref 150–400)
RBC: 3.65 MIL/uL — ABNORMAL LOW (ref 4.22–5.81)
RDW: 16.3 % — ABNORMAL HIGH (ref 11.5–15.5)
WBC: 4.8 10*3/uL (ref 4.0–10.5)
nRBC: 0 % (ref 0.0–0.2)

## 2020-03-29 LAB — GLUCOSE, CAPILLARY
Glucose-Capillary: 133 mg/dL — ABNORMAL HIGH (ref 70–99)
Glucose-Capillary: 162 mg/dL — ABNORMAL HIGH (ref 70–99)
Glucose-Capillary: 212 mg/dL — ABNORMAL HIGH (ref 70–99)
Glucose-Capillary: 183 mg/dL — ABNORMAL HIGH (ref 70–99)

## 2020-03-29 LAB — BASIC METABOLIC PANEL
Anion gap: 9 (ref 5–15)
BUN: 11 mg/dL (ref 8–23)
CO2: 27 mmol/L (ref 22–32)
Calcium: 8.4 mg/dL — ABNORMAL LOW (ref 8.9–10.3)
Chloride: 101 mmol/L (ref 98–111)
Creatinine, Ser: 1.28 mg/dL — ABNORMAL HIGH (ref 0.61–1.24)
GFR calc Af Amer: >60 mL/min (ref >60)
GFR calc non Af Amer: 58 mL/min — ABNORMAL LOW (ref >60)
Glucose, Bld: 102 mg/dL — ABNORMAL HIGH (ref 70–99)
Potassium: 4.2 mmol/L (ref 3.5–5.1)
Sodium: 137 mmol/L (ref 135–145)

## 2020-03-29 NOTE — Progress Notes (Signed)
CARDIAC REHAB PHASE I   Pt seen ambulating in hallways independently with steady gait. Pt denies any concerns. Will f/u as time allows.  Rufina Falco, RN BSN 03/29/2020 10:16 AM

## 2020-03-29 NOTE — Progress Notes (Signed)
Patient ambulated in hallway independent in several times today. Will monitor patient. Franchesca Veneziano, Bettina Gavia RN

## 2020-03-29 NOTE — Care Management Important Message (Signed)
Important Message  Patient Details  Name: Anoop Hemmer MRN: [id number] Date of Birth: Dec 07, 1950   Medicare Important Message Given:  Yes     Shelda Altes 03/29/2020, 10:39 AM

## 2020-03-29 NOTE — Progress Notes (Addendum)
      [address]       [place], [address]             [phone]      5 Days Post-Op Procedure(s) (LRB): CORONARY ARTERY BYPASS GRAFTING (CABG), ON PUMP, TIMES THREE, USING LEFT INTERNAL MAMMARY ARTERY AND RIGHT ENDOSCOPICALLY HARVESTED GREATER SAPHENOUS VEIN (N/A) TRANSESOPHAGEAL ECHOCARDIOGRAM (TEE) (N/A) Subjective: Feels okay this morning. Spoke with his daughter on the phone who assisted in translation.   Objective: Vital signs in last 24 hours: Temp:  [97.1 F (36.2 C)-98.7 F (37.1 C)] 98.7 F (37.1 C) (07/20 0759) Pulse Rate:  [69-99] 90 (07/20 0823) Cardiac Rhythm: Normal sinus rhythm (07/20 0745) Resp:  [12-21] 16 (07/20 0759) BP: (85-127)/(54-84) 115/70 (07/20 0759) SpO2:  [96 %-100 %] 100 % (07/20 0759) Weight:  [68.8 kg] 68.8 kg (07/20 0428)     Intake/Output from previous day: 07/19 0701 - 07/20 0700 In: 240 [P.O.:240] Out: 1875 [Urine:1875] Intake/Output this shift: Total I/O In: -  Out: 200 [Urine:200]  General appearance: alert, cooperative and no distress Heart: regular rate and rhythm, S1, S2 normal, no murmur, click, rub or gallop Lungs: clear to auscultation bilaterally Abdomen: soft, non-tender; bowel sounds normal; no masses,  no organomegaly Extremities: extremities normal, atraumatic, no cyanosis or edema Wound: clean and dry  Lab Results: Recent Labs    03/28/20 0241 03/29/20 0313  WBC 5.4 4.8  HGB 9.8* 10.0*  HCT 31.5* 31.8*  PLT 233 282   BMET:  Recent Labs    03/28/20 0241 03/29/20 0313  NA 138 137  K 3.6 4.2  CL 102 101  CO2 28 27  GLUCOSE 116* 102*  BUN 13 11  CREATININE 1.19 1.28*  CALCIUM 8.2* 8.4*    PT/INR: No results for input(s): LABPROT, INR in the last 72 hours. ABG    Component Value Date/Time   PHART 7.319 (L) 03/25/2020 0639   HCO3 19.9 (L) 03/25/2020 0639   TCO2 21 (L) 03/25/2020 0639   ACIDBASEDEF 6.0 (H) 03/25/2020 0639   O2SAT 61.5 03/27/2020 0336   CBG (last 3)  Recent  Labs    03/28/20 1555 03/28/20 2155 03/29/20 0602  GLUCAP 111* 225* 133*    Assessment/Plan: S/P Procedure(s) (LRB): CORONARY ARTERY BYPASS GRAFTING (CABG), ON PUMP, TIMES THREE, USING LEFT INTERNAL MAMMARY ARTERY AND RIGHT ENDOSCOPICALLY HARVESTED GREATER SAPHENOUS VEIN (N/A) TRANSESOPHAGEAL ECHOCARDIOGRAM (TEE) (N/A)  1. CV- NSR in the 70s, BP well controlled. coox 61.5% 2. Pulm-CXR yesterday showed no significant change. His saturation is 100% on room air.   3. Renal-creatinine 1.28, electrolytes okay. Continue lasix 40mg  daily. Remains 3kg over baseline.  4. H and H 10.0/31.8, expected acute blood loss anemia 5. Endo-blood glucose well controlled today   Plan: Spoke with his daughter over the phone who helped translate. He will have his son stay with him the first week. EPW removed today since his rhythm has been stable. Continue diuretics for fluid overload. Hopefully home tomorrow.    LOS: 11 days    Elgie Collard 03/29/2020  progressing well - plan DC in am Discharge instructions reviewed with daughter  patient examined and medical record reviewed,agree with above note. Tharon Aquas Trigt III 03/29/2020

## 2020-03-29 NOTE — Progress Notes (Signed)
Mobility Specialist: Progress Note    03/29/20 1433  Mobility  Activity Ambulated in hall  Level of Assistance Independent  Assistive Device None  Distance Ambulated (ft) 800 ft  Mobility Response Tolerated well  Mobility performed by Family member   Pt seen walking independently in hallway with family member. Distance was estimated based on description of walk. Pt had no c/o.   [facility] Katelynn Heidler Mobility Specialist

## 2020-03-29 NOTE — Progress Notes (Signed)
EPW removed per order. Tips intact. VSS. Pt educated on one hour bed rest. Call light in reach.

## 2020-03-30 LAB — GLUCOSE, CAPILLARY: Glucose-Capillary: 144 mg/dL — ABNORMAL HIGH (ref 70–99)

## 2020-03-30 MED ORDER — FUROSEMIDE 40 MG PO TABS: 40.0000 mg | ORAL_TABLET | Freq: Every day | ORAL | 0 refills | Status: DC

## 2020-03-30 MED ORDER — ASPIRIN EC 81 MG PO TBEC: 81.0000 mg | DELAYED_RELEASE_TABLET | Freq: Every day | ORAL | 1 refills | Status: DC

## 2020-03-30 MED ORDER — METOPROLOL TARTRATE 25 MG PO TABS: 12.5000 mg | ORAL_TABLET | Freq: Two times a day (BID) | ORAL | 1 refills | Status: DC

## 2020-03-30 MED ORDER — METFORMIN HCL 500 MG PO TABS: 500.0000 mg | ORAL_TABLET | Freq: Two times a day (BID) | ORAL | 0 refills | Status: DC

## 2020-03-30 MED ORDER — ATORVASTATIN CALCIUM 80 MG PO TABS: 80.0000 mg | ORAL_TABLET | Freq: Every day | ORAL | 1 refills | Status: DC

## 2020-03-30 MED ORDER — OXYCODONE HCL 5 MG PO TABS: 5.0000 mg | ORAL_TABLET | Freq: Four times a day (QID) | ORAL | 0 refills | Status: DC | PRN

## 2020-03-30 NOTE — Discharge Instructions (Signed)

## 2020-03-30 NOTE — Progress Notes (Signed)
IV's and telemetry discontinued at this time. CCMD notified. Discharge instructions reviewed with patient and patient's son. All questions answered.   K. Starr Tremar Wickens, RN 

## 2020-03-30 NOTE — Plan of Care (Signed)
Discharge to home °

## 2020-03-30 NOTE — Plan of Care (Signed)
Continue to monitor

## 2020-03-30 NOTE — Progress Notes (Signed)
CARDIAC REHAB PHASE I   Pt seen ambulating independently in hallway with son. Pt denies pain or SOB. Reviewed importance of site care and monitoring incision daily. Encouraged continued ambulation, IS use, and sternal precautions. Left in-the-tube sheet along with heart healthy diet. Reviewed restrictions and exercise guidelines. Will refer to CRP II [place]. Pt and family deny further questions or concerns.  [phone] Rufina Falco, RN BSN 03/30/2020 10:38 AM

## 2020-03-30 NOTE — Progress Notes (Addendum)
      [address]       [place] [address]             [phone]      6 Days Post-Op Procedure(s) (LRB): CORONARY ARTERY BYPASS GRAFTING (CABG), ON PUMP, TIMES THREE, USING LEFT INTERNAL MAMMARY ARTERY AND RIGHT ENDOSCOPICALLY HARVESTED GREATER SAPHENOUS VEIN (N/A) TRANSESOPHAGEAL ECHOCARDIOGRAM (TEE) (N/A) Subjective: Feels good this morning and ready for discharge home.   Objective: Vital signs in last 24 hours: Temp:  [98.1 F (36.7 C)-98.8 F (37.1 C)] 98.5 F (36.9 C) (07/21 0447) Pulse Rate:  [78-92] 88 (07/21 0449) Cardiac Rhythm: Normal sinus rhythm (07/20 2000) Resp:  [12-20] 20 (07/21 0449) BP: (104-141)/(60-74) 119/71 (07/21 0447) SpO2:  [94 %-100 %] 100 % (07/21 0449) Weight:  [67.9 kg] 67.9 kg (07/21 0449)     Intake/Output from previous day: 07/20 0701 - 07/21 0700 In: 360 [P.O.:360] Out: 200 [Urine:200] Intake/Output this shift: No intake/output data recorded.  General appearance: alert, cooperative and no distress Heart: sinus tachycardia Lungs: clear to auscultation bilaterally Abdomen: soft, non-tender; bowel sounds normal; no masses,  no organomegaly Extremities: extremities normal, atraumatic, no cyanosis or edema Wound: clean and dry  Lab Results: Recent Labs    03/28/20 0241 03/29/20 0313  WBC 5.4 4.8  HGB 9.8* 10.0*  HCT 31.5* 31.8*  PLT 233 282   BMET:  Recent Labs    03/28/20 0241 03/29/20 0313  NA 138 137  K 3.6 4.2  CL 102 101  CO2 28 27  GLUCOSE 116* 102*  BUN 13 11  CREATININE 1.19 1.28*  CALCIUM 8.2* 8.4*    PT/INR: No results for input(s): LABPROT, INR in the last 72 hours. ABG    Component Value Date/Time   PHART 7.319 (L) 03/25/2020 0639   HCO3 19.9 (L) 03/25/2020 0639   TCO2 21 (L) 03/25/2020 0639   ACIDBASEDEF 6.0 (H) 03/25/2020 0639   O2SAT 61.5 03/27/2020 0336   CBG (last 3)  Recent Labs    03/29/20 1617 03/29/20 2205 03/30/20 0627  GLUCAP 212* 183* 144*     Assessment/Plan: S/P Procedure(s) (LRB): CORONARY ARTERY BYPASS GRAFTING (CABG), ON PUMP, TIMES THREE, USING LEFT INTERNAL MAMMARY ARTERY AND RIGHT ENDOSCOPICALLY HARVESTED GREATER SAPHENOUS VEIN (N/A) TRANSESOPHAGEAL ECHOCARDIOGRAM (TEE) (N/A)  1. CV- NSR in the 80s, BP well controlled.  2. Pulm-CXR yesterday showed no significant change. His saturation is 100% on room air.   3. Renal-creatinine 1.28, electrolytes okay. Continue lasix 40mg  daily. Remains 2kg over baseline.  4. H and H 10.0/31.8, expected acute blood loss anemia 5. Endo-blood glucose well controlled today  Plan: Home today on 3 days of lasix. Continue ambulation at home. Instructions reviewed with daughter by Dr. [name] yesterday.    LOS: 12 days    Elgie Collard 03/30/2020  doing well- ready for DC Instructions on wound care ,meds, activity restriction,followup reviewed with patient and son in room  patient examined and medical record reviewed,agree with above note. Tharon Aquas Trigt III 03/30/2020

## 2020-03-31 ENCOUNTER — Telehealth (HOSPITAL_COMMUNITY): Payer: Self-pay

## 2020-03-31 NOTE — Telephone Encounter (Signed)
Faxed referral for Phase II cardiac rehab to [facility].

## 2020-04-06 ENCOUNTER — Encounter (INDEPENDENT_AMBULATORY_CARE_PROVIDER_SITE_OTHER): Payer: Self-pay

## 2020-04-06 ENCOUNTER — Other Ambulatory Visit: Payer: Self-pay

## 2020-04-06 DIAGNOSIS — Z4802 Encounter for removal of sutures: Secondary | ICD-10-CM

## 2020-04-12 ENCOUNTER — Telehealth: Payer: Self-pay | Admitting: Physician Assistant

## 2020-04-12 NOTE — Telephone Encounter (Signed)
Patient's daughter, Jimmy Blankenship, called to advise the office that the patient will need an interpreter for his appointment on 04/15/20 with Richardson Dopp PA. He speaks [name].

## 2020-04-12 NOTE — Telephone Encounter (Signed)
Per message from language resources: Language [employer] has been assigned to interpret, if Interpreter is late call [phone] and use Language Line- [phone]

## 2020-04-14 NOTE — Progress Notes (Signed)
Cardiology Office Note:    Date:  04/15/2020   ID:  Jef, Futch June 18, 1951, MRN [id number]  PCP:  [name], MD  Cardiologist:  Mertie Moores, MD  Electrophysiologist:  None   Referring MD: [name], MD   Chief Complaint:  Hospitalization Follow-up (Non-STEMI, s/p CABG)    Patient Profile:    Jimmy Blankenship is a 69 y.o. male with:   Coronary artery disease   Hx of PCI in early 2000s (in [place])  [place]/NSTEMI >> S/p CABG 12/3327  Chronic systolic CHF  Ischemic cardiomyopathy  Echo 7/21: EF 35  Intraoperative TEE 7/21: EF 40-45  Diabetes mellitus   Hypertension   Hyperlipidemia   Chronic kidney disease   Iron deficiency anemia   Prior CV studies: Intraoperative TEE 03/24/2020 EF 40-45  Carotid US 03/18/2020 Bilateral ICA 1-39  Echocardiogram 03/18/2020 EF 35, inferior, septal, lateral and apical HK/AK, normal RVSF, trivial MR  Cardiac catheterization 03/17/2020  Severe diffuse three-vessel coronary artery disease in the 69 year old [name] gentleman with diabetes mellitus type 2.  20% mid body left main disease.  Proximal LAD stent with mid body 40 to 60% restenosis depending upon review.  Severe multifocal diffuse mid to apical LAD.  Large diagonal with ostial 50% narrowing and 60 to 70% mid narrowing.  Severe diffuse disease circumflex coronary disease with proximal to mid body circumflex up to 90% narrowed.  Each of 3 obtuse marginals have high-grade obstructive disease.  The second and third obtuse marginals are large enough to be grafted.  Dominant right coronary with moderate diffuse disease including smaller distal branches.  Eccentric 50 to 70% healed ulcerated plaque in the mid vessel above the proximal margin of the previously placed stent.  There is distal high-grade in-stent restenosis that is somewhat hazy possibly representing thrombus.  PDA contains proximal 80% stenosis.  There is apical akinesis/dyskinesis with EF in the 35 to 40%  range.  LVEDP is normal.   History of Present Illness:    Mr. Tammen was admitted 7/7-7/21 with unstable angina/non-STEMI.  Cardiac catheterization demonstrated 3 v coronary artery disease.  Echocardiogram demonstrated LV dysfunction with EF 35%.  He was referred for CABG.  Of note, his Hgb was as low as 7.8.  Hemoccult was positive.  He was seen by GI and plan was for workup as an OP.  Of note, he was awaiting referral to GI prior to admission for known iron deficiency anemia.  His post op course was fairly uneventful.        He returns for follow-up.  He is seen with the assistance of an interpreter today.  He has been doing fairly well since discharge from the hospital.  He has been walking about 30 minutes a day.  He does not have significant shortness of breath.  His chest is still somewhat sore.  His appetite is okay.  He is having difficulty sleeping at night.  He has not had orthopnea or leg swelling or syncope.  He has noted some palpitations at times.  Past Medical History:  Diagnosis Date  . Diabetes mellitus without complication ([place])   . High cholesterol   . Hypertension     Current Medications: Current Meds  Medication Sig  . aspirin EC 81 MG tablet Take 1 tablet (81 mg total) by mouth daily.  Marland Kitchen atorvastatin (LIPITOR) 80 MG tablet Take 1 tablet (80 mg total) by mouth daily.  . clopidogrel (PLAVIX) 75 MG tablet Take 75 mg by  mouth daily.  Marland Kitchen glipiZIDE (GLUCOTROL) 5 MG tablet Take 5 mg by mouth 2 (two) times daily.  . metFORMIN (GLUCOPHAGE) 500 MG tablet Take 1 tablet (500 mg total) by mouth 2 (two) times daily for 28 days.  . metoprolol tartrate (LOPRESSOR) 25 MG tablet Take 1 tablet (25 mg total) by mouth 2 (two) times daily.  Marland Kitchen oxyCODONE (OXY IR/ROXICODONE) 5 MG immediate release tablet Take 1 tablet (5 mg total) by mouth every 6 (six) hours as needed for severe pain.  . [DISCONTINUED] atorvastatin (LIPITOR) 80 MG tablet Take 1 tablet (80 mg total) by mouth daily.  .  [DISCONTINUED] metoprolol tartrate (LOPRESSOR) 25 MG tablet Take 0.5 tablets (12.5 mg total) by mouth 2 (two) times daily.     Allergies:   Patient has no known allergies.   Social History   Tobacco Use  . Smoking status: Former Research scientist (life sciences)  . Smokeless tobacco: Never Used  Vaping Use  . Vaping Use: Never used  Substance Use Topics  . Alcohol use: Never  . Drug use: Never     Family Hx: The patient's family history is not on file.  ROS   EKGs/Labs/Other Test Reviewed:    EKG:  EKG is   ordered today.  The ekg ordered today demonstrates normal sinus rhythm, heart rate 97, normal axis, inferior Q waves, anterolateral Q waves, nonspecific ST-T wave changes, QTC 441  Recent Labs: 03/19/2020: TSH 1.167 03/23/2020: ALT 44 03/25/2020: Magnesium 2.1 03/29/2020: BUN 11; Creatinine, Ser 1.28; Hemoglobin 10.0; Platelets 282; Potassium 4.2; Sodium 137   Recent Lipid Panel No results found for: CHOL, TRIG, HDL, CHOLHDL, LDLCALC, LDLDIRECT  Physical Exam:    VS:  BP 118/70   Pulse 97   Ht 5\' 8"  (1.727 m)   Wt 143 lb 3.2 oz (65 kg)   SpO2 99%   BMI 21.77 kg/m     Wt Readings from Last 3 Encounters:  04/15/20 143 lb 3.2 oz (65 kg)  03/30/20 149 lb 11.1 oz (67.9 kg)     Constitutional:      Appearance: Healthy appearance. Not in distress.  Neck:     Vascular: JVD normal.  Pulmonary:     Effort: Pulmonary effort is normal.     Breath sounds: No wheezing. No rales.  Chest:     Comments: Median sternotomy well-healed without erythema or discharge Cardiovascular:     Normal rate. Regular rhythm. Normal S1. Normal S2.     Murmurs: There is no murmur.  Edema:    Peripheral edema absent.  Abdominal:     Palpations: Abdomen is soft.  Skin:    General: Skin is warm and dry.  Neurological:     General: No focal deficit present.     Mental Status: Alert and oriented to person, place and time.     Cranial Nerves: Cranial nerves are intact.      ASSESSMENT & PLAN:    1.  Non-STEMI (non-ST elevated myocardial infarction) ([place]) Status post CABG.  He is progressing well.  Continue aspirin, clopidogrel, metoprolol, atorvastatin.  I have encouraged him to pursue cardiac rehabilitation.  2. Chronic systolic CHF (congestive heart failure) ([place]) 3. Ischemic cardiomyopathy EF 35%.  NYHA II.  Volume status appears stable.  I will increase his metoprolol tartrate to 25 mg twice daily.  I will see him back in about 4 weeks.  I will see if we can start him on losartan at that time.  He will need a follow-up echocardiogram 90 days  post MI.  If his EF is < 35, I will refer him to EP for consideration of ICD.  4. Essential hypertension The patient's blood pressure is controlled on his current regimen.  Continue current therapy.   5. Hyperlipidemia, unspecified hyperlipidemia type Continue high intensity statin therapy.  At next visit, arrange fasting CMET, lipids.  6. Stage 2 chronic kidney disease Obtain follow-up BMET today.  7. Iron deficiency anemia, unspecified iron deficiency anemia type Obtain follow-up CBC today.  I will make sure he has follow-up with [facility] that saw him in the hospital.    Dispo:  Return in about 4 weeks (around 05/13/2020) for Routine Follow Up, w/ Richardson Dopp, PA-C, in person.   Medication Adjustments/Labs and Tests Ordered: Current medicines are reviewed at length with the patient today.  Concerns regarding medicines are outlined above.  Tests Ordered: Orders Placed This Encounter  Procedures  . Basic metabolic panel  . CBC  . Ambulatory referral to Gastroenterology  . EKG 12-Lead  . ECHOCARDIOGRAM LIMITED   Medication Changes: Meds ordered this encounter  Medications  . atorvastatin (LIPITOR) 80 MG tablet    Sig: Take 1 tablet (80 mg total) by mouth daily.    Dispense:  90 tablet    Refill:  3  . metoprolol tartrate (LOPRESSOR) 25 MG tablet    Sig: Take 1 tablet (25 mg total) by mouth 2 (two) times daily.    Dispense:  180  tablet    Refill:  3    Signed, Richardson Dopp, PA-C  04/15/2020 1:35 PM    [facility] [address], [place], [place]  [address] Phone: [phone]; Fax: [phone]

## 2020-04-15 ENCOUNTER — Telehealth: Payer: Self-pay | Admitting: Cardiovascular Disease

## 2020-04-15 ENCOUNTER — Encounter: Payer: Self-pay | Admitting: Physician Assistant

## 2020-04-15 ENCOUNTER — Ambulatory Visit: Payer: Medicare Other | Admitting: Physician Assistant

## 2020-04-15 ENCOUNTER — Other Ambulatory Visit: Payer: Self-pay

## 2020-04-15 VITALS — BP 118/70 | HR 97 | Ht 68.0 in | Wt 143.2 lb

## 2020-04-15 DIAGNOSIS — N182 Chronic kidney disease, stage 2 (mild): Secondary | ICD-10-CM

## 2020-04-15 DIAGNOSIS — I10 Essential (primary) hypertension: Secondary | ICD-10-CM

## 2020-04-15 DIAGNOSIS — I5022 Chronic systolic (congestive) heart failure: Secondary | ICD-10-CM

## 2020-04-15 DIAGNOSIS — I214 Non-ST elevation (NSTEMI) myocardial infarction: Secondary | ICD-10-CM | POA: Diagnosis not present

## 2020-04-15 DIAGNOSIS — D509 Iron deficiency anemia, unspecified: Secondary | ICD-10-CM

## 2020-04-15 DIAGNOSIS — I255 Ischemic cardiomyopathy: Secondary | ICD-10-CM | POA: Diagnosis not present

## 2020-04-15 DIAGNOSIS — E785 Hyperlipidemia, unspecified: Secondary | ICD-10-CM

## 2020-04-15 MED ORDER — ATORVASTATIN CALCIUM 80 MG PO TABS: 80.0000 mg | ORAL_TABLET | Freq: Every day | ORAL | 3 refills | Status: DC

## 2020-04-15 MED ORDER — METOPROLOL TARTRATE 25 MG PO TABS: 25.0000 mg | ORAL_TABLET | Freq: Two times a day (BID) | ORAL | 3 refills | Status: DC

## 2020-04-15 NOTE — Telephone Encounter (Signed)
I called to speak with [facility]ist at [facility], patient was on Metoprolol Succinate before CABG, medication was changed to Metoprolol Tartrate 25 mg, 0.5 tablet by mouth twice a day after CABG. Medication was increased to Metoprolol Tartrate 25, 1 tablet by mouth twice a day at office visit with Nicki Reaper. Pharmacy aware.

## 2020-04-15 NOTE — Telephone Encounter (Signed)
Bradley is calling to clarify a change made on metoprolol tartrate (LOPRESSOR) 25 MG tablet. She states it was changed from IR x1 a day to IR x2 day. Please advise.

## 2020-04-15 NOTE — Patient Instructions (Addendum)
Medication Instructions:  Your physician has recommended you make the following change in your medication:   1) Increase Metoprolol to 25 mg, 1 tablet by mouth twice a day  Lab Work: You will have labs drawn today: BMET/CBC  Testing/Procedures: Your physician has requested that you have an limited echocardiogram after 07/10/20. Echocardiography is a painless test that uses sound waves to create images of your heart. It provides your doctor with information about the size and shape of your heart and how well your heart's chambers and valves are working. This procedure takes approximately one hour. There are no restrictions for this procedure.  Follow-Up: On 05/18/20 at 11:45 with Richardson Dopp, PA-C  On 07/18/20 at 10:00AM with Mertie Moores, MD; come fasting to this appointment your cholesterol will be checked.  Other Instructions Ok to use over the counter benadryl 25 mg as needed for sleep

## 2020-04-16 LAB — BASIC METABOLIC PANEL
BUN/Creatinine Ratio: 16 (ref 10–24)
BUN: 17 mg/dL (ref 8–27)
CO2: 22 mmol/L (ref 20–29)
Calcium: 9.6 mg/dL (ref 8.6–10.2)
Chloride: 98 mmol/L (ref 96–106)
Creatinine, Ser: 1.08 mg/dL (ref 0.76–1.27)
GFR calc Af Amer: 82 mL/min/{1.73_m2} (ref >59)
GFR calc non Af Amer: 71 mL/min/{1.73_m2} (ref >59)
Glucose: 110 mg/dL — ABNORMAL HIGH (ref 65–99)
Potassium: 5.1 mmol/L (ref 3.5–5.2)
Sodium: 135 mmol/L (ref 134–144)

## 2020-04-16 LAB — CBC
Hematocrit: 32.2 % — ABNORMAL LOW (ref 37.5–51.0)
Hemoglobin: 10.2 g/dL — ABNORMAL LOW (ref 13.0–17.7)
MCH: 27.1 pg (ref 26.6–33.0)
MCHC: 31.7 g/dL (ref 31.5–35.7)
MCV: 85 fL (ref 79–97)
Platelets: 546 10*3/uL — ABNORMAL HIGH (ref 150–450)
RBC: 3.77 x10E6/uL — ABNORMAL LOW (ref 4.14–5.80)
RDW: 15.3 % (ref 11.6–15.4)
WBC: 7.4 10*3/uL (ref 3.4–10.8)

## 2020-04-26 ENCOUNTER — Other Ambulatory Visit: Payer: Self-pay | Admitting: Cardiothoracic Surgery

## 2020-04-26 DIAGNOSIS — Z951 Presence of aortocoronary bypass graft: Secondary | ICD-10-CM

## 2020-04-27 ENCOUNTER — Ambulatory Visit (INDEPENDENT_AMBULATORY_CARE_PROVIDER_SITE_OTHER): Payer: Self-pay | Admitting: Cardiothoracic Surgery

## 2020-04-27 ENCOUNTER — Ambulatory Visit
Admission: RE | Admit: 2020-04-27 | Discharge: 2020-04-27 | Disposition: A | Discharge status: Home / Self Care | Payer: Medicare Other | Source: Ambulatory Visit | Attending: Cardiothoracic Surgery | Admitting: Cardiothoracic Surgery

## 2020-04-27 ENCOUNTER — Encounter: Payer: Self-pay | Admitting: Cardiothoracic Surgery

## 2020-04-27 ENCOUNTER — Other Ambulatory Visit: Payer: Self-pay

## 2020-04-27 VITALS — BP 126/70 | HR 90 | Resp 20 | Ht 68.0 in | Wt 141.0 lb

## 2020-04-27 DIAGNOSIS — I2510 Atherosclerotic heart disease of native coronary artery without angina pectoris: Secondary | ICD-10-CM

## 2020-04-27 DIAGNOSIS — Z951 Presence of aortocoronary bypass graft: Secondary | ICD-10-CM

## 2020-04-27 IMAGING — DX DG CHEST 2V
2 series · 2 of 2 positions shown · non-contrast
Comparison: Chest radiograph dated [DATE]

CLINICAL DATA: 69-year-old male status post CABG.

EXAM:
CHEST - 2 VIEW

[dg chest 2 view (1 of 2)]
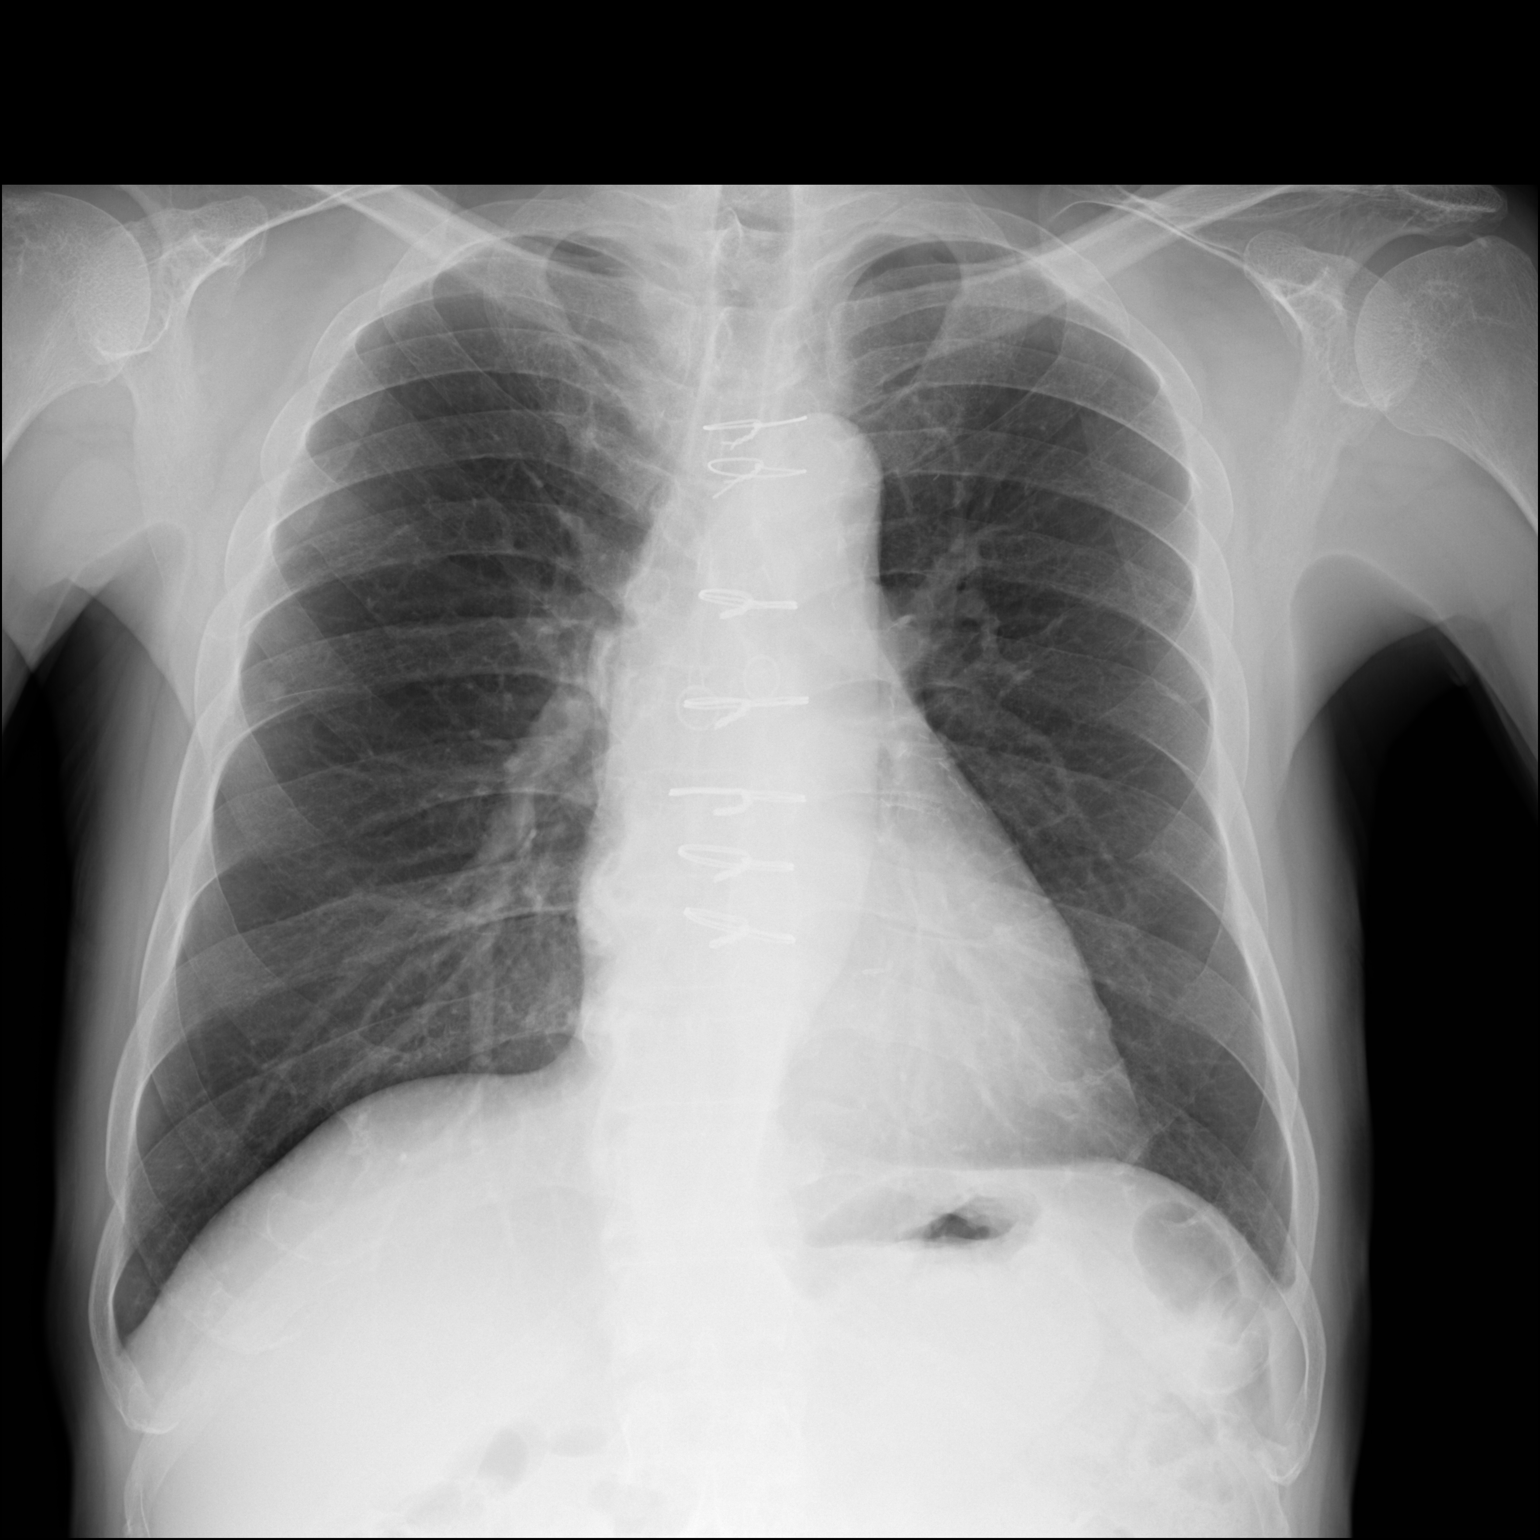

[dg chest 2 view (2 of 2)]
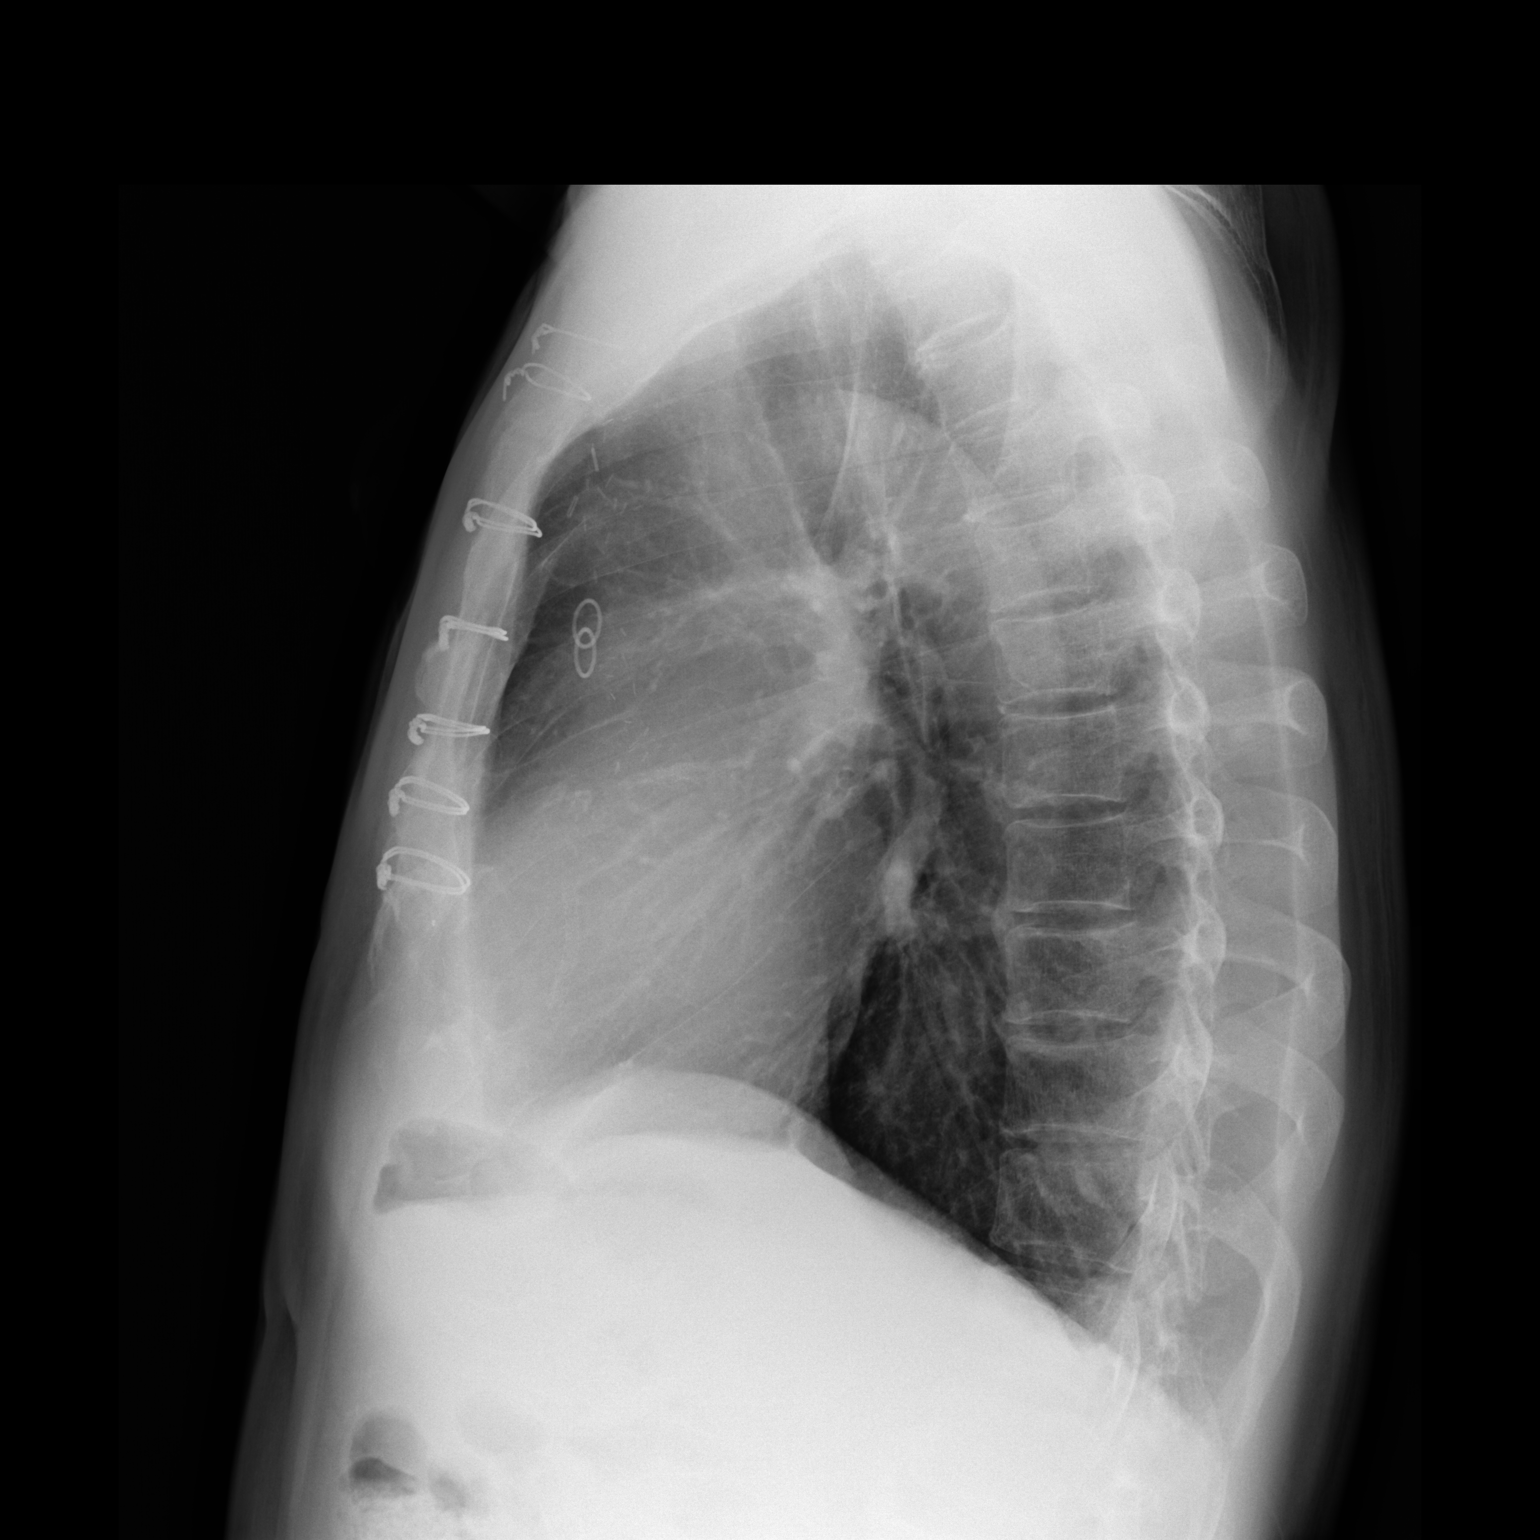

[2 of 2 positions shown; findings below may reference images not displayed]

FINDINGS: No focal consolidation, pleural effusion or pneumothorax. Trace
pleural effusions may be present. The cardiac silhouette is within
limits. Median sternotomy wires and CABG vascular clips. No acute
osseous pathology.
IMPRESSION: No focal consolidation.

## 2020-04-27 MED ORDER — METFORMIN HCL 500 MG PO TABS: 1000.0000 mg | ORAL_TABLET | Freq: Two times a day (BID) | ORAL | 0 refills | Status: DC

## 2020-04-27 NOTE — Progress Notes (Signed)
PCP is [name], MD Referring Provider is Belva Crome, MD  Chief Complaint  Patient presents with  . Routine Post Op    f/u from surgery with CXR s/p Coronary artery bypass grafting x3, 03/25/2020     HPI: Schedule I month postop follow-up after urgent multivessel CABG for ischemic cardiomyopathy.  He is progressing well with improved exercise tolerance and no recurrent symptoms of angina or CHF.  He is walking 40 minutes daily.  Surgical incisions are well-healed.  His appetite and sleeping pattern is slowly improving.  Chest x-ray performed today shows clear lung fields, no evidence of pleural effusion.  Sternal wires intact.  Past Medical History:  Diagnosis Date  . Diabetes mellitus without complication ([place])   . High cholesterol   . Hypertension     Past Surgical History:  Procedure Laterality Date  . CORONARY ANGIOPLASTY WITH STENT PLACEMENT    . CORONARY ARTERY BYPASS GRAFT N/A 03/24/2020   Procedure: CORONARY ARTERY BYPASS GRAFTING (CABG), ON PUMP, TIMES THREE, USING LEFT INTERNAL MAMMARY ARTERY AND RIGHT ENDOSCOPICALLY HARVESTED GREATER SAPHENOUS VEIN;  Surgeon: Ivin Poot, MD;  Location: [facility];  Service: Open Heart Surgery;  Laterality: N/A;  . LEFT HEART CATH AND CORONARY ANGIOGRAPHY N/A 03/17/2020   Procedure: LEFT HEART CATH AND CORONARY ANGIOGRAPHY;  Surgeon: Belva Crome, MD;  Location: [facility];  Service: Cardiovascular;  Laterality: N/A;  . TEE WITHOUT CARDIOVERSION N/A 03/24/2020   Procedure: TRANSESOPHAGEAL ECHOCARDIOGRAM (TEE);  Surgeon: [name], Collier Salina, MD;  Location: [facility];  Service: Open Heart Surgery;  Laterality: N/A;    History reviewed. No pertinent family history.  Social History Social History   Tobacco Use  . Smoking status: Former Research scientist (life sciences)  . Smokeless tobacco: Never Used  Vaping Use  . Vaping Use: Never used  Substance Use Topics  . Alcohol use: Never  . Drug use: Never    Current Outpatient Medications  Medication  Sig Dispense Refill  . aspirin EC 81 MG tablet Take 1 tablet (81 mg total) by mouth daily. 30 tablet 1  . atorvastatin (LIPITOR) 80 MG tablet Take 1 tablet (80 mg total) by mouth daily. 90 tablet 3  . clopidogrel (PLAVIX) 75 MG tablet Take 75 mg by mouth daily.    Marland Kitchen glipiZIDE (GLUCOTROL) 5 MG tablet Take 5 mg by mouth 2 (two) times daily.    . metFORMIN (GLUCOPHAGE) 500 MG tablet Take 1 tablet (500 mg total) by mouth 2 (two) times daily for 28 days. 56 tablet 0  . metoprolol tartrate (LOPRESSOR) 25 MG tablet Take 1 tablet (25 mg total) by mouth 2 (two) times daily. 180 tablet 3  . oxyCODONE (OXY IR/ROXICODONE) 5 MG immediate release tablet Take 1 tablet (5 mg total) by mouth every 6 (six) hours as needed for severe pain. 30 tablet 0   No current facility-administered medications for this visit.    No Known Allergies  Review of Systems  No fever Weight down but appetite is improving Sleeping pattern poor but improving No edema No clicking or popping of sternal incision  BP 126/70   Pulse 90   Resp 20   Ht 5\' 8"  (1.727 m)   Wt 141 lb (64 kg)   SpO2 100% Comment: RA  BMI 21.44 kg/m  Physical Exam      Exam    General- alert and comfortable.  Surgical incisions clean and dry.    Neck- no JVD, no cervical adenopathy palpable, no carotid  bruit   Lungs- clear without rales, wheezes   Cor- regular rate and rhythm, no murmur , gallop   Abdomen- soft, non-tender   Extremities - warm, non-tender, minimal edema   Neuro- oriented, appropriate, no focal weakness   Diagnostic Tests: Today's chest x-ray personally reviewed with results as above-clear without pleural effusion.  Impression: Doing well 1 month postop CABG x3. Patient is encouraged to continue to walk 40 minutes a day.  He is interested in being referred to [facility].  We will contact the program.  He will continue his current meds except return to his preoperative dose of  Metformin 1 g p.o. twice daily. Plan: Return in 1 month for review of progress.  Len Childs, MD [facility] [phone]

## 2020-05-17 ENCOUNTER — Encounter: Payer: Self-pay | Admitting: Physician Assistant

## 2020-05-17 NOTE — Progress Notes (Signed)
Cardiology Office Note:    Date:  05/18/2020   ID:  Korde, Jeppsen 1950-11-26, MRN [id number]  PCP:  [name], MD  Cardiologist:  Mertie Moores, MD  Electrophysiologist:  None   Referring MD: [name], MD   Chief Complaint:  Follow-up (CAD, CHF)    Patient Profile:    Jimmy Blankenship is a 69 y.o. male with:   Coronary artery disease   Hx of PCI in early 2000s (in [place])  [place]/NSTEMI >> S/p CABG 12/7094  Chronic systolic CHF  Ischemic cardiomyopathy  Echo 7/21: EF 35  Intraoperative TEE 7/21: EF 40-45  Diabetes mellitus   Hypertension   Hyperlipidemia   Chronic kidney disease   Iron deficiency anemia   Prior CV studies: Intraoperative TEE 03/24/2020 EF 40-45  Carotid US 03/18/2020 Bilateral ICA 1-39  Echocardiogram 03/18/2020 EF 35, inferior, septal, lateral and apical HK/AK, normal RVSF, trivial MR  Cardiac catheterization 03/17/2020  Severe diffuse three-vessel coronary artery disease in the 69 year old [name] gentleman with diabetes mellitus type 2.  20% mid body left main disease.  Proximal LAD stent with mid body 40 to 60% restenosis depending upon review.  Severe multifocal diffuse mid to apical LAD.  Large diagonal with ostial 50% narrowing and 60 to 70% mid narrowing.  Severe diffuse disease circumflex coronary disease with proximal to mid body circumflex up to 90% narrowed.  Each of 3 obtuse marginals have high-grade obstructive disease.  The second and third obtuse marginals are large enough to be grafted.  Dominant right coronary with moderate diffuse disease including smaller distal branches.  Eccentric 50 to 70% healed ulcerated plaque in the mid vessel above the proximal margin of the previously placed stent.  There is distal high-grade in-stent restenosis that is somewhat hazy possibly representing thrombus.  PDA contains proximal 80% stenosis.  There is apical akinesis/dyskinesis with EF in the 35 to 40% range.  LVEDP is  normal.   History of Present Illness:    Mr. Fonseca was last seen in 8/21 for post hospitalization follow up.  He had been admitted with a NSTEMI and underwent CABG. His EF was 35.  Of note, he also was anemic with heme + stool.  I adjusted his beta-blocker with hopes to add ACEi/ARB at follow up.  We referred him to GI for follow up on heme + anemia.  He returns for follow up.  He does not have an appt with GI yet.  He is seen with the help of an interpreter today.  Since last seen, he has been doing fairly well.  He does have occasional episodes where his heart rate increases.  He really has not had chest discomfort or significant shortness of breath.  He has not had pleuritic symptoms or chest discomfort with lying supine.  He has not had syncope, orthopnea or leg swelling.  Past Medical History:  Diagnosis Date  . CKD (chronic kidney disease)   . Coronary Artery Disease s/p CABG    hx of PCI in 2000s in [place] // s/p NSTEMI in 7/21 >> CABG  . Diabetes mellitus 2   . Heart failure with reduced ejection fraction    Ischemic CM // Echo 7/21 - EF 35 // Intraop TEE 7/21: EF 40-45  . High cholesterol   . Hypertension   . Iron deficiency anemia    Heme + stools (during admit for NSTEMI in 7/21)    Current Medications: Current Meds  Medication Sig  .  aspirin EC 81 MG tablet Take 1 tablet (81 mg total) by mouth daily.  Marland Kitchen atorvastatin (LIPITOR) 80 MG tablet Take 1 tablet (80 mg total) by mouth daily.  . clopidogrel (PLAVIX) 75 MG tablet Take 75 mg by mouth daily.  . metFORMIN (GLUCOPHAGE) 1000 MG tablet Take 1,000 mg by mouth 2 (two) times daily.  . metoprolol tartrate (LOPRESSOR) 50 MG tablet Take 1 tablet (50 mg total) by mouth 2 (two) times daily.  . sertraline (ZOLOFT) 50 MG tablet Take 50 mg by mouth daily.   . [DISCONTINUED] metoprolol tartrate (LOPRESSOR) 25 MG tablet Take 1 tablet (25 mg total) by mouth 2 (two) times daily.     Allergies:   Patient has no known allergies.   Social  History   Tobacco Use  . Smoking status: Former Research scientist (life sciences)  . Smokeless tobacco: Never Used  Vaping Use  . Vaping Use: Never used  Substance Use Topics  . Alcohol use: Never  . Drug use: Never     Family Hx: The patient's family history is not on file.  Review of Systems  Gastrointestinal: Negative for hematochezia and melena.      EKGs/Labs/Other Test Reviewed:    EKG:  EKG is    ordered today.  The ekg ordered today demonstrates normal sinus rhythm, heart rate 91, normal axis, inferior and anterolateral Q waves, inferolateral T wave inversions, QTC 445  Recent Labs: 03/19/2020: TSH 1.167 03/23/2020: ALT 44 03/25/2020: Magnesium 2.1 04/15/2020: BUN 17; Creatinine, Ser 1.08; Hemoglobin 10.2; Platelets 546; Potassium 5.1; Sodium 135   Recent Lipid Panel No results found for: CHOL, TRIG, HDL, CHOLHDL, LDLCALC, LDLDIRECT  Physical Exam:    VS:  BP 118/60   Pulse 93   Ht 5\' 8"  (1.727 m)   Wt 138 lb (62.6 kg)   SpO2 98%   BMI 20.98 kg/m     Wt Readings from Last 3 Encounters:  05/18/20 138 lb (62.6 kg)  04/27/20 141 lb (64 kg)  04/15/20 143 lb 3.2 oz (65 kg)     Constitutional:      Appearance: Healthy appearance. Not in distress.  Neck:     Vascular: JVD normal.  Pulmonary:     Effort: Pulmonary effort is normal.     Breath sounds: No wheezing. No rales.  Cardiovascular:     Normal rate. Regular rhythm. Normal S1. Normal S2.     Murmurs: There is no murmur.     No rub.  Edema:    Peripheral edema absent.  Abdominal:     Palpations: Abdomen is soft.  Skin:    General: Skin is warm and dry.  Neurological:     General: No focal deficit present.     Mental Status: Alert and oriented to person, place and time.     Cranial Nerves: Cranial nerves are intact.      ASSESSMENT & PLAN:    1. Coronary artery disease involving native coronary artery of native heart without angina pectoris Status post non-STEMI in 03/2020 with subsequent CABG.  He seems to be  progressing well.  He does have a rub on exam.  However he does not have pleuritic chest symptoms or symptoms to suggest tamponade.  There is no ST elevation on his ECG.  I have recommended getting his echocardiogram sooner to rule out significant effusion.  Continue current dose of aspirin, atorvastatin, clopidogrel.  Continue beta-blocker.  2. Chronic systolic CHF (congestive heart failure) ([facility]) Ischemic cardiomyopathy.  EF 35.  NYHA II.  As noted, he does have a rub on exam.  I will obtain his limited echocardiogram sooner to reassess his EF and rule out pericardial effusion.  Continue beta-blocker.  I am adjusting his beta-blocker today and therefore I am not placing him on ARB.  Consider ARB initiation at follow-up.  3. Hyperlipidemia, unspecified hyperlipidemia type Continue high intensity statin therapy with atorvastatin 80 mg daily.  Arrange fasting CMET, lipids.  4. Essential hypertension Blood pressure is well controlled on current therapy.  5. Tachycardia He notes occasional episodes of rapid heartbeats.  Increase metoprolol tartrate 50 mg twice daily.  Obtain 14-day live ZIO to rule out significant arrhythmia.     Dispo:  Return in about 4 weeks (around 06/15/2020) for Routine Follow Up, w/ Richardson Dopp, PA-C, in person.   Medication Adjustments/Labs and Tests Ordered: Current medicines are reviewed at length with the patient today.  Concerns regarding medicines are outlined above.  Tests Ordered: Orders Placed This Encounter  Procedures  . LONG TERM MONITOR-LIVE TELEMETRY (3-14 DAYS)  . EKG 12-Lead   Medication Changes: Meds ordered this encounter  Medications  . metoprolol tartrate (LOPRESSOR) 50 MG tablet    Sig: Take 1 tablet (50 mg total) by mouth 2 (two) times daily.    Dispense:  180 tablet    Refill:  1    Signed, Richardson Dopp, PA-C  05/18/2020 12:41 PM    [facility] [address], [place], [place]  [address] Phone: [phone]; Fax:  [phone]

## 2020-05-18 ENCOUNTER — Ambulatory Visit: Payer: Medicare Other | Admitting: Physician Assistant

## 2020-05-18 ENCOUNTER — Telehealth: Payer: Self-pay | Admitting: Radiology

## 2020-05-18 ENCOUNTER — Encounter: Payer: Self-pay | Admitting: Physician Assistant

## 2020-05-18 ENCOUNTER — Other Ambulatory Visit: Payer: Self-pay

## 2020-05-18 VITALS — BP 118/60 | HR 93 | Ht 68.0 in | Wt 138.0 lb

## 2020-05-18 DIAGNOSIS — I10 Essential (primary) hypertension: Secondary | ICD-10-CM

## 2020-05-18 DIAGNOSIS — R000 Tachycardia, unspecified: Secondary | ICD-10-CM

## 2020-05-18 DIAGNOSIS — I2510 Atherosclerotic heart disease of native coronary artery without angina pectoris: Secondary | ICD-10-CM | POA: Diagnosis not present

## 2020-05-18 DIAGNOSIS — E785 Hyperlipidemia, unspecified: Secondary | ICD-10-CM | POA: Diagnosis not present

## 2020-05-18 DIAGNOSIS — I5022 Chronic systolic (congestive) heart failure: Secondary | ICD-10-CM | POA: Diagnosis not present

## 2020-05-18 MED ORDER — METOPROLOL TARTRATE 50 MG PO TABS
50.0000 mg | ORAL_TABLET | Freq: Two times a day (BID) | ORAL | 1 refills | Status: DC
Start: 2020-05-18 — End: 2020-11-16

## 2020-05-18 NOTE — Telephone Encounter (Signed)
Enrolled patient for a 14 day Zio AT monitor to be mailed to patients home.  

## 2020-05-18 NOTE — Patient Instructions (Addendum)
Medication Instructions:  Your physician has recommended you make the following change in your medication:   1) Increase Metoprolol to 50 mg, 1 tablet by mouth twice a day  *If you need a refill on your cardiac medications before your next appointment, please call your pharmacy*  Lab Work: None ordered today  Testing/Procedures: A zio monitor was ordered today. It will remain on for 14 days. You will then return monitor and event diary in provided box. It takes 1-2 weeks for report to be downloaded and returned to [place]. We will call you with the results. If monitor falls off or has orange flashing light, please call Zio for further instructions.   Your physician has requested that you have an limited echocardiogram. Echocardiography is a painless test that uses sound waves to create images of your heart. It provides your doctor with information about the size and shape of your heart and how well your heart's chambers and valves are working. This procedure takes approximately one hour. There are no restrictions for this procedure.  Follow-Up: On 06/10/20 at 8:45AM with Richardson Dopp, come fasting to the appointment so your cholesterol can be checked.

## 2020-05-23 ENCOUNTER — Telehealth: Payer: Self-pay | Admitting: *Deleted

## 2020-05-23 NOTE — Telephone Encounter (Signed)
Received call from [facility] monitor has not been applied because patient requested to know why it was ordered. Patient had complained of fast heartbeat.  Richardson Dopp, PA-C had increased Metoprolol tartrate to 50 mg bid and ordered a 14 day ZIO AT monitor to evaluate for possible arrhythmias. Instructions reviewed using [name] interpreter [phone].

## 2020-05-25 ENCOUNTER — Ambulatory Visit (INDEPENDENT_AMBULATORY_CARE_PROVIDER_SITE_OTHER): Payer: Medicare Other

## 2020-05-25 DIAGNOSIS — R000 Tachycardia, unspecified: Secondary | ICD-10-CM | POA: Diagnosis not present

## 2020-06-01 ENCOUNTER — Other Ambulatory Visit: Payer: Self-pay

## 2020-06-01 ENCOUNTER — Encounter: Payer: Self-pay | Admitting: Cardiothoracic Surgery

## 2020-06-01 ENCOUNTER — Ambulatory Visit (INDEPENDENT_AMBULATORY_CARE_PROVIDER_SITE_OTHER): Payer: Self-pay | Admitting: Physician Assistant

## 2020-06-01 VITALS — BP 107/58 | HR 79 | Temp 97.7°F | Resp 18 | Ht 68.0 in | Wt 139.8 lb

## 2020-06-01 DIAGNOSIS — I2510 Atherosclerotic heart disease of native coronary artery without angina pectoris: Secondary | ICD-10-CM

## 2020-06-01 DIAGNOSIS — Z951 Presence of aortocoronary bypass graft: Secondary | ICD-10-CM

## 2020-06-01 NOTE — Patient Instructions (Signed)
No change in medications.  Continue to observe sternal precautions  Follow up with Dr. Darcey Nora in 1 month as PRN.

## 2020-06-01 NOTE — Progress Notes (Signed)
  HPI: [name] speaks [name].  This encounter utilized interpreter services via telephone. Patient returns for routine postoperative follow-up having undergone CABG x3 in July of this year by Dr. Darcey Nora for unstable angina pectoris.  He had a prior history of PTCI about 10 years ago in [place].  Ejection fraction immediately prior to surgery was about 35%.  He had apical hypokinesis and mild MR. The patient's early postoperative recovery while in the hospital was routine and he was discharged on the sixth postoperative day. Since hospital discharge the patient reports he is continued to his soreness but this is improving.  He has some shortness of breath with activity.  He is working warm arrangements for cardiac rehab with a heart stretch program in [place]. [name] was seen by [name] 2 weeks ago and at that time he reported episodic tachycardia.  He has a Zio patch in place as prescribed by [name] at that visit.   Current Outpatient Medications  Medication Sig Dispense Refill  . aspirin EC 81 MG tablet Take 1 tablet (81 mg total) by mouth daily. 30 tablet 1  . atorvastatin (LIPITOR) 80 MG tablet Take 1 tablet (80 mg total) by mouth daily. 90 tablet 3  . clopidogrel (PLAVIX) 75 MG tablet Take 75 mg by mouth daily.    Marland Kitchen glipiZIDE (GLUCOTROL) 5 MG tablet Take 5 mg by mouth daily before breakfast.    . metFORMIN (GLUCOPHAGE) 1000 MG tablet Take 1,000 mg by mouth 2 (two) times daily.    . metoprolol tartrate (LOPRESSOR) 50 MG tablet Take 1 tablet (50 mg total) by mouth 2 (two) times daily. (Patient taking differently: Take 25 mg by mouth 2 (two) times daily. ) 180 tablet 1  . sertraline (ZOLOFT) 50 MG tablet Take 50 mg by mouth daily.      No current facility-administered medications for this visit.    Physical Exam   General: Thin [name] male no acute distress Heart: Regular rate and rhythm, no murmur. Chest: Symmetrical, sternotomy incision is well-healed.  There is no  instability.  Breath sounds are clear to auscultation. Extremities: No peripheral edema, all are well perfused.  The right lower extremity EVH incision is healing with no sign of complication.  Diagnostic Tests:   None today   Impression / Plan: [name] appears to be progressive and satisfactory recovery following coronary bypass grafting for unstable angina pectoris and ischemic myopathy with ejection fraction estimated at 35% immediately prior to surgery.  His vital signs are stable.  He appears well perfused and has no evidence of failure.  Medications are reviewed and no changes.  Necessary from our standpoint.  He has scheduled follow-up with [name] of the cardiology service and is currently being evaluated with a Zio patch continuous heart monitor. I asked [name] to follow with [place] in 1 month and as needed.  He is encouraged to follow through with plan to enroll with [facility].   Antony Odea, PA-C [facility] [phone]

## 2020-06-07 ENCOUNTER — Other Ambulatory Visit: Payer: Self-pay

## 2020-06-07 ENCOUNTER — Ambulatory Visit (HOSPITAL_COMMUNITY): Payer: Medicare Other | Attending: Cardiology

## 2020-06-07 ENCOUNTER — Encounter: Payer: Self-pay | Admitting: Physician Assistant

## 2020-06-07 DIAGNOSIS — I5022 Chronic systolic (congestive) heart failure: Secondary | ICD-10-CM | POA: Diagnosis not present

## 2020-06-07 LAB — ECHOCARDIOGRAM LIMITED
Area-P 1/2: 3.65 cm2
S' Lateral: 3.3 cm

## 2020-06-07 MED ORDER — PERFLUTREN LIPID MICROSPHERE
1.0000 mL | INTRAVENOUS | Status: AC | PRN
  Administered 2020-06-07: 1 mL via INTRAVENOUS

## 2020-06-09 NOTE — Progress Notes (Addendum)
Cardiology Office Note:    Date:  06/10/2020   ID:  Jimmy Blankenship, Jimmy Blankenship 15-Jun-1951, MRN [id number]  PCP:  Jimmy Holstein, MD  Cardiologist:  Jimmy Moores, MD  Electrophysiologist:  None   Referring MD: Jimmy Holstein, MD   Chief Complaint:  Follow-up (CHF)    Patient Profile:    Jimmy Blankenship is a 69 y.o. male with:   Coronary artery disease   Hx of PCI in early 2000s (in [place])  [place] >> S/p CABG 12/979  Chronic systolic CHF  Ischemic cardiomyopathy  Echo 7/21: EF 35  Intraoperative TEE 7/21: EF 40-45  Echo 9/21: EF 40-45, Gr 2 DD  Diabetes mellitus   Hypertension   Hyperlipidemia   Chronic kidney disease   Iron deficiency anemia   Prior CV studies: Echocardiogram 05/30/2020 Apical AK, EF 40-45, GRII DD, trace AI, mild MR, mild LAE  Intraoperative TEE 03/24/2020 EF 40-45  Carotid US 03/18/2020 Bilateral ICA 1-39  Echocardiogram 03/18/2020 EF 35, inferior, septal, lateral and apical HK/AK, normal RVSF, trivial MR  Cardiac catheterization 03/17/2020  Severe diffuse three-vessel coronary artery disease in the 69 year old [name] gentleman with diabetes mellitus type 2.  20% mid body left main disease.  Proximal LAD stent with mid body 40 to 60% restenosis depending upon review.  Severe multifocal diffuse mid to apical LAD.  Large diagonal with ostial 50% narrowing and 60 to 70% mid narrowing.  Severe diffuse disease circumflex coronary disease with proximal to mid body circumflex up to 90% narrowed.  Each of 3 obtuse marginals have high-grade obstructive disease.  The second and third obtuse marginals are large enough to be grafted.  Dominant right coronary with moderate diffuse disease including smaller distal branches.  Eccentric 50 to 70% healed ulcerated plaque in the mid vessel above the proximal margin of the previously placed stent.  There is distal high-grade in-stent restenosis that is somewhat hazy possibly representing thrombus.  PDA  contains proximal 80% stenosis.  There is apical akinesis/dyskinesis with EF in the 35 to 40% range.  LVEDP is normal.   History of Present Illness:    Jimmy Blankenship was last seen 05/18/2020.  He had a rub on cardiac exam but no symptoms to suggest tamponade or pericarditis.  I ordered his follow-up echocardiogram to be done earlier.  He also had episodes of symptomatic tachycardia.  I metoprolol tartrate and arranged a 14-day ZIO monitor.  His echocardiogram demonstrated stable/improved LV function with an EF of 40-45%.  There is no pericardial effusion.  His monitor still pending.  He returns for follow-up.  He is seen with the help of an interpreter (Jimmy Blankenship, Jimmy Blankenship).  Since last seen, he is feeling better.  He still has some fast heartbeats but these are improved.  He has some discomfort in his chest at times.  But this is improved.  This is not like his previous angina.  It seems to be with certain movements.  He has not had significant shortness of breath, orthopnea, leg swelling.  He has not had pleuritic chest discomfort.  Past Medical History:  Diagnosis Date  . CKD (chronic kidney disease)   . Coronary Artery Disease s/p CABG    hx of PCI in 2000s in [place] // s/p NSTEMI in 7/21 >> CABG  . Diabetes mellitus 2   . Heart failure with reduced ejection fraction    Ischemic CM // Echo 7/21 - EF 35 // Intraop TEE 7/21: EF 40-45 // Echocardiogram 9/21: apical  AK, EF 40-45, Gr 2 DD, trace AI, mild MR, mild LAE, normal RVSF, no pericardial effusion  . High cholesterol   . Hypertension   . Iron deficiency anemia    Heme + stools (during admit for NSTEMI in 7/21)    Current Medications: Current Meds  Medication Sig  . aspirin EC 81 MG tablet Take 1 tablet (81 mg total) by mouth daily.  Marland Kitchen atorvastatin (LIPITOR) 80 MG tablet Take 1 tablet (80 mg total) by mouth daily.  . clopidogrel (PLAVIX) 75 MG tablet Take 75 mg by mouth daily.  . metFORMIN (GLUCOPHAGE) 1000 MG tablet Take 1,000 mg by mouth 2  (two) times daily.  . metoprolol tartrate (LOPRESSOR) 50 MG tablet Take 1 tablet (50 mg total) by mouth 2 (two) times daily.     Allergies:   Patient has no known allergies.   Social History   Tobacco Use  . Smoking status: Former Research scientist (life sciences)  . Smokeless tobacco: Never Used  Vaping Use  . Vaping Use: Never used  Substance Use Topics  . Alcohol use: Never  . Drug use: Never     Family Hx: The patient's family history is not on file.  Review of Systems  Respiratory: Negative for cough.   See HPI   EKGs/Labs/Other Test Reviewed:    EKG:  EKG is not   ordered today.  The ekg ordered today demonstratesn/a  Recent Labs: 03/19/2020: TSH 1.167 03/23/2020: ALT 44 03/25/2020: Magnesium 2.1 04/15/2020: BUN 17; Creatinine, Ser 1.08; Hemoglobin 10.2; Platelets 546; Potassium 5.1; Sodium 135   Recent Lipid Panel No results found for: CHOL, TRIG, HDL, CHOLHDL, LDLCALC, LDLDIRECT  Physical Exam:    VS:  BP 118/62   Pulse 74   Ht _0  (1.727 m)   Wt 142 lb (64.4 kg)   SpO2 98%   BMI 21.59 kg/m     Wt Readings from Last 3 Encounters:  06/10/20 (!) 1421 lb (644.6 kg)  06/01/20 139 lb 12.8 oz (63.4 kg)  05/18/20 138 lb (62.6 kg)     Constitutional:      Appearance: Healthy appearance. Not in distress.  Pulmonary:     Effort: Pulmonary effort is normal.     Breath sounds: No wheezing. No rales.  Cardiovascular:     Normal rate. Regular rhythm. Normal S1. Normal S2.     Murmurs: There is no murmur.     No rub.  Edema:    Peripheral edema absent.  Abdominal:     Palpations: Abdomen is soft.  Musculoskeletal:     Cervical back: Neck supple. Skin:    General: Skin is warm and dry.  Neurological:     General: No focal deficit present.     Mental Status: Alert and oriented to person, place and time.     Cranial Nerves: Cranial nerves are intact.       ASSESSMENT & PLAN:    1. Coronary artery disease involving native coronary artery of native heart without angina  pectoris Status post non-STEMI followed by CABG in July 2021.  Overall, he is doing well.  He had some musculoskeletal type chest discomfort but no recurrent angina.  Continue aspirin, clopidogrel, atorvastatin, metoprolol tartrate.  Follow-up with Dr. Acie Fredrickson in November as planned.  2. Chronic systolic CHF (congestive heart failure) (HCC) Ischemic cardiomyopathy.  Recent echocardiogram with improved LV function.  EF is now 40-45%.  NYHA II.  Volume status stable.  Continue beta-blocker therapy.  I will try to place him on  ACE inhibitor therapy with lisinopril 2.5 mg daily.  C-Met is pending today.  Follow-up BMET in 3 to 4 weeks (this can be obtained at Dr. Elmarie Shiley appointment).  He knows to contact us if he is not tolerating lisinopril.  3. Essential hypertension Blood pressure is controlled.  Add lisinopril for management of CHF.  4. Hyperlipidemia, unspecified hyperlipidemia type Continue high intensity statin therapy.  Fasting CMET, lipids will be obtained today.  5. Tachycardia His symptoms have improved on higher dose beta-blocker therapy.  His monitor is still pending.  I encouraged him to go ahead and mail this back in today.   Dispo:  Return in about 5 weeks (around 07/15/2020) for w/ Dr. Acie Fredrickson, in person.   Medication Adjustments/Labs and Tests Ordered: Current medicines are reviewed at length with the patient today.  Concerns regarding medicines are outlined above.  Tests Ordered: Orders Placed This Encounter  Procedures  . Comprehensive metabolic panel  . Lipid panel   Medication Changes: Meds ordered this encounter  Medications  . lisinopril (ZESTRIL) 2.5 MG tablet    Sig: Take 1 tablet (2.5 mg total) by mouth daily.    Dispense:  90 tablet    Refill:  3    Signed, Richardson Dopp, PA-C  06/10/2020 9:34 AM    [facility] [place], [place], [place]  [address] Phone: [phone]; Fax: [phone]

## 2020-06-10 ENCOUNTER — Ambulatory Visit (INDEPENDENT_AMBULATORY_CARE_PROVIDER_SITE_OTHER): Payer: Medicare Other | Admitting: Physician Assistant

## 2020-06-10 ENCOUNTER — Encounter: Payer: Self-pay | Admitting: Physician Assistant

## 2020-06-10 ENCOUNTER — Other Ambulatory Visit: Payer: Self-pay

## 2020-06-10 VITALS — BP 118/62 | HR 74 | Ht 68.0 in | Wt 142.0 lb

## 2020-06-10 DIAGNOSIS — I10 Essential (primary) hypertension: Secondary | ICD-10-CM | POA: Diagnosis not present

## 2020-06-10 DIAGNOSIS — I5022 Chronic systolic (congestive) heart failure: Secondary | ICD-10-CM

## 2020-06-10 DIAGNOSIS — I2510 Atherosclerotic heart disease of native coronary artery without angina pectoris: Secondary | ICD-10-CM | POA: Diagnosis not present

## 2020-06-10 DIAGNOSIS — R000 Tachycardia, unspecified: Secondary | ICD-10-CM

## 2020-06-10 DIAGNOSIS — E785 Hyperlipidemia, unspecified: Secondary | ICD-10-CM

## 2020-06-10 LAB — COMPREHENSIVE METABOLIC PANEL
ALT: 17 IU/L (ref 0–44)
AST: 15 IU/L (ref 0–40)
Albumin/Globulin Ratio: 1.7 (ref 1.2–2.2)
Albumin: 3.8 g/dL (ref 3.8–4.8)
Alkaline Phosphatase: 98 IU/L (ref 44–121)
BUN/Creatinine Ratio: 16 (ref 10–24)
BUN: 17 mg/dL (ref 8–27)
Bilirubin Total: 0.3 mg/dL (ref 0.0–1.2)
CO2: 22 mmol/L (ref 20–29)
Calcium: 9.1 mg/dL (ref 8.6–10.2)
Chloride: 105 mmol/L (ref 96–106)
Creatinine, Ser: 1.06 mg/dL (ref 0.76–1.27)
GFR calc Af Amer: 84 mL/min/{1.73_m2} (ref >59)
GFR calc non Af Amer: 72 mL/min/{1.73_m2} (ref >59)
Globulin, Total: 2.3 g/dL (ref 1.5–4.5)
Glucose: 98 mg/dL (ref 65–99)
Potassium: 5.1 mmol/L (ref 3.5–5.2)
Sodium: 140 mmol/L (ref 134–144)
Total Protein: 6.1 g/dL (ref 6.0–8.5)

## 2020-06-10 LAB — LIPID PANEL
Chol/HDL Ratio: 3.1 ratio (ref 0.0–5.0)
Cholesterol, Total: 118 mg/dL (ref 100–199)
HDL: 38 mg/dL — ABNORMAL LOW (ref >39)
LDL Chol Calc (NIH): 60 mg/dL (ref 0–99)
Triglycerides: 105 mg/dL (ref 0–149)
VLDL Cholesterol Cal: 20 mg/dL (ref 5–40)

## 2020-06-10 MED ORDER — LISINOPRIL 2.5 MG PO TABS: 2.5000 mg | ORAL_TABLET | Freq: Every day | ORAL | 3 refills | Status: DC

## 2020-06-10 NOTE — Patient Instructions (Addendum)
Medication Instructions:  Your physician has recommended you make the following change in your medication:   1) Start Lisinopril 2.5 mg, 1 tablet by mouth once a day  *If you need a refill on your cardiac medications before your next appointment, please call your pharmacy*  Lab Work: You will have labs drawn today: CMET/Lipids  If you have labs (blood work) drawn today and your tests are completely normal, you will receive your results only by: Marland Kitchen MyChart Message (if you have MyChart) OR . A paper copy in the mail If you have any lab test that is abnormal or we need to change your treatment, we will call you to review the results.  Testing/Procedures: None ordered today  Follow-Up: Keep appointment with Dr. Acie Fredrickson on 07/18/20 at 10:00AM

## 2020-06-22 ENCOUNTER — Encounter: Payer: Self-pay | Admitting: Physician Assistant

## 2020-06-29 ENCOUNTER — Other Ambulatory Visit: Payer: Self-pay

## 2020-06-29 ENCOUNTER — Encounter: Payer: Self-pay | Admitting: Cardiothoracic Surgery

## 2020-06-29 ENCOUNTER — Ambulatory Visit (INDEPENDENT_AMBULATORY_CARE_PROVIDER_SITE_OTHER): Payer: Medicare Other | Admitting: Cardiothoracic Surgery

## 2020-06-29 VITALS — BP 103/60 | HR 80 | Temp 97.7°F | Resp 20 | Ht 68.0 in | Wt 142.0 lb

## 2020-06-29 DIAGNOSIS — I2510 Atherosclerotic heart disease of native coronary artery without angina pectoris: Secondary | ICD-10-CM | POA: Diagnosis not present

## 2020-06-29 DIAGNOSIS — Z9889 Other specified postprocedural states: Secondary | ICD-10-CM | POA: Diagnosis not present

## 2020-06-29 DIAGNOSIS — Z951 Presence of aortocoronary bypass graft: Secondary | ICD-10-CM

## 2020-06-29 NOTE — Progress Notes (Signed)
PCP is [name], MD Referring Provider is Belva Crome, MD  Chief Complaint  Patient presents with  . Routine Post Op    1 month f/u, HX of CABG ([place]) on the phone    HPI: Patient returns for surgical follow-up final visit 3 months after multivessel CABG for severe coronary disease with reduced EF and ischemic cardiomyopathy.  The patient has received a postoperative echo from the cardiology office which has demonstrated improvement in his ventricular function with EF now 45%.  He is doing well clinically.  He remains in a sinus rhythm.  He did not have a chest x-ray performed today but his last postop chest x-ray was satisfactory.   Past Medical History:  Diagnosis Date  . CKD (chronic kidney disease)   . Coronary Artery Disease s/p CABG    hx of PCI in 2000s in [place] // s/p NSTEMI in 7/21 >> CABG  . Diabetes mellitus 2   . Heart failure with reduced ejection fraction    Ischemic CM // Echo 7/21 - EF 35 // Intraop TEE 7/21: EF 40-45 // Echocardiogram 9/21: apical AK, EF 40-45, Gr 2 DD, trace AI, mild MR, mild LAE, normal RVSF, no pericardial effusion  . High cholesterol   . Hypertension   . Iron deficiency anemia    Heme + stools (during admit for NSTEMI in 7/21)  . Palpitations    Event monitor 10/21: NSR, rare PVCs, rare 4 beats NSVT, rare brief non-sustained SVT    Past Surgical History:  Procedure Laterality Date  . CORONARY ANGIOPLASTY WITH STENT PLACEMENT    . CORONARY ARTERY BYPASS GRAFT N/A 03/24/2020   Procedure: CORONARY ARTERY BYPASS GRAFTING (CABG), ON PUMP, TIMES THREE, USING LEFT INTERNAL MAMMARY ARTERY AND RIGHT ENDOSCOPICALLY HARVESTED GREATER SAPHENOUS VEIN;  Surgeon: Ivin Poot, MD;  Location: [facility];  Service: Open Heart Surgery;  Laterality: N/A;  . LEFT HEART CATH AND CORONARY ANGIOGRAPHY N/A 03/17/2020   Procedure: LEFT HEART CATH AND CORONARY ANGIOGRAPHY;  Surgeon: Belva Crome, MD;  Location: [facility];  Service:  Cardiovascular;  Laterality: N/A;  . TEE WITHOUT CARDIOVERSION N/A 03/24/2020   Procedure: TRANSESOPHAGEAL ECHOCARDIOGRAM (TEE);  Surgeon: [name], Collier Salina, MD;  Location: [facility];  Service: Open Heart Surgery;  Laterality: N/A;    History reviewed. No pertinent family history.  Social History Social History   Tobacco Use  . Smoking status: Former Research scientist (life sciences)  . Smokeless tobacco: Never Used  Vaping Use  . Vaping Use: Never used  Substance Use Topics  . Alcohol use: Never  . Drug use: Never    Current Outpatient Medications  Medication Sig Dispense Refill  . aspirin EC 81 MG tablet Take 1 tablet (81 mg total) by mouth daily. 30 tablet 1  . atorvastatin (LIPITOR) 80 MG tablet Take 1 tablet (80 mg total) by mouth daily. 90 tablet 3  . clopidogrel (PLAVIX) 75 MG tablet Take 75 mg by mouth daily.    Marland Kitchen lisinopril (ZESTRIL) 2.5 MG tablet Take 1 tablet (2.5 mg total) by mouth daily. 90 tablet 3  . metFORMIN (GLUCOPHAGE) 1000 MG tablet Take 1,000 mg by mouth 2 (two) times daily.    . metoprolol tartrate (LOPRESSOR) 50 MG tablet Take 1 tablet (50 mg total) by mouth 2 (two) times daily. 180 tablet 1   No current facility-administered medications for this visit.    No Known Allergies  Review of Systems  Participating in rehab and making good progress  BP 103/60   Pulse 80   Temp 97.7 F (36.5 C) (Skin)   Resp 20   Ht 5\' 8"  (1.727 m)   Wt 142 lb (64.4 kg)   SpO2 99% Comment: RA  BMI 21.59 kg/m  Physical Exam      Exam    General- alert and comfortable.  Surgical incisions well-healed.    Neck- no JVD, no cervical adenopathy palpable, no carotid bruit   Lungs- clear without rales, wheezes   Cor- regular rate and rhythm, no murmur , gallop   Abdomen- soft, non-tender   Extremities - warm, non-tender, minimal edema   Neuro- oriented, appropriate, no focal weakness   Diagnostic Tests: None today  Impression: Excellent recovery after urgent multivessel CABG for severe coronary  artery disease with low ejection fraction.  Improvement in ejection fraction by postop echo.  Plan: Importance of heart healthy lifestyle including heart healthy diet and daily walking activity 40 minutes discussed with patient through [name] translator provided by the hospital service. He may do normal activities and lift reasonable weights.  He understands he should continue his current medications and follow-up will be through the cardiology office.   Len Childs, MD [facility] [phone]

## 2020-07-12 ENCOUNTER — Other Ambulatory Visit (HOSPITAL_COMMUNITY): Payer: Medicare Other

## 2020-07-17 ENCOUNTER — Encounter: Payer: Self-pay | Admitting: Cardiovascular Disease

## 2020-07-17 NOTE — Progress Notes (Signed)
Cardiology Office Note:    Date:  07/18/2020   ID:  Jimmy Blankenship, Jimmy Blankenship 04-10-1951, MRN [id number]  PCP:  [name], MD  [facility] Cardiologist:  Mertie Moores, MD  [place] Electrophysiologist:  None   Referring MD: [name], MD   Chief Complaint  Patient presents with  . Coronary Artery Disease     Nov. 8, 2021   Jimmy Blankenship is a 69 y.o. male with a hx of 3 V CAD, s/p CABG, CHF, GI bleed,   I met him in the hospital in July, 2021 when he presented with a NSTEMI. Had CABG [name])  Has been seen by Richardson Dopp, PA in the office  Overall is doing well.   No further angina  Has some chest wall soreness,  Nothing out of the ordinary  Is getting some regular exerxise.  Going to [facility] .   Labs from [place]. 1, 2021 were reviewed Total chol = 118 HDL = 38 Trigs = 105 LDL = 60  He gets labs from his primary MD Osborne Oman )    Past Medical History:  Diagnosis Date  . CKD (chronic kidney disease)   . Coronary Artery Disease s/p CABG    hx of PCI in 2000s in [place] // s/p NSTEMI in 7/21 >> CABG  . Diabetes mellitus 2   . Heart failure with reduced ejection fraction    Ischemic CM // Echo 7/21 - EF 35 // Intraop TEE 7/21: EF 40-45 // Echocardiogram 9/21: apical AK, EF 40-45, Gr 2 DD, trace AI, mild MR, mild LAE, normal RVSF, no pericardial effusion  . High cholesterol   . Hypertension   . Iron deficiency anemia    Heme + stools (during admit for NSTEMI in 7/21)  . Palpitations    Event monitor 10/21: NSR, rare PVCs, rare 4 beats NSVT, rare brief non-sustained SVT    Past Surgical History:  Procedure Laterality Date  . CORONARY ANGIOPLASTY WITH STENT PLACEMENT    . CORONARY ARTERY BYPASS GRAFT N/A 03/24/2020   Procedure: CORONARY ARTERY BYPASS GRAFTING (CABG), ON PUMP, TIMES THREE, USING LEFT INTERNAL MAMMARY ARTERY AND RIGHT ENDOSCOPICALLY HARVESTED GREATER SAPHENOUS VEIN;  Surgeon: Ivin Poot, MD;  Location:  [place];  Service: Open Heart Surgery;  Laterality: N/A;  . LEFT HEART CATH AND CORONARY ANGIOGRAPHY N/A 03/17/2020   Procedure: LEFT HEART CATH AND CORONARY ANGIOGRAPHY;  Surgeon: Belva Crome, MD;  Location: [place] [facility];  Service: Cardiovascular;  Laterality: N/A;  . TEE WITHOUT CARDIOVERSION N/A 03/24/2020   Procedure: TRANSESOPHAGEAL ECHOCARDIOGRAM (TEE);  Surgeon: [name], Collier Salina, MD;  Location: [place];  Service: Open Heart Surgery;  Laterality: N/A;    Current Medications: Current Meds  Medication Sig  . aspirin EC 81 MG tablet Take 1 tablet (81 mg total) by mouth daily.  Marland Kitchen atorvastatin (LIPITOR) 80 MG tablet Take 1 tablet (80 mg total) by mouth daily.  . clopidogrel (PLAVIX) 75 MG tablet Take 75 mg by mouth daily.  Marland Kitchen GABAPENTIN PO Take 1 tablet by mouth daily. Unsure of strength  . lisinopril (ZESTRIL) 2.5 MG tablet Take 1 tablet (2.5 mg total) by mouth daily.  . metFORMIN (GLUCOPHAGE) 1000 MG tablet Take 1,000 mg by mouth 2 (two) times daily.  . metoprolol tartrate (LOPRESSOR) 50 MG tablet Take 1 tablet (50 mg total) by mouth 2 (two) times daily.     Allergies:   Patient  has no known allergies.   Social History   Socioeconomic History  . Marital status: Married    Spouse name: Not on file  . Number of children: Not on file  . Years of education: Not on file  . Highest education level: Not on file  Occupational History  . Not on file  Tobacco Use  . Smoking status: Former Research scientist (life sciences)  . Smokeless tobacco: Never Used  Vaping Use  . Vaping Use: Never used  Substance and Sexual Activity  . Alcohol use: Never  . Drug use: Never  . Sexual activity: Not on file  Other Topics Concern  . Not on file  Social History Narrative  . Not on file   Social Determinants of Health   Financial Resource Strain:   . Difficulty of Paying Living Expenses: Not on file  Food Insecurity:   . Worried About Charity fundraiser in the Last Year: Not on file  . Ran Out of Food in the Last  Year: Not on file  Transportation Needs:   . Lack of Transportation (Medical): Not on file  . Lack of Transportation (Non-Medical): Not on file  Physical Activity:   . Days of Exercise per Week: Not on file  . Minutes of Exercise per Session: Not on file  Stress:   . Feeling of Stress : Not on file  Social Connections:   . Frequency of Communication with Friends and Family: Not on file  . Frequency of Social Gatherings with Friends and Family: Not on file  . Attends Religious Services: Not on file  . Active Member of Clubs or Organizations: Not on file  . Attends Archivist Meetings: Not on file  . Marital Status: Not on file     Family History: The patient's family history includes Atrial fibrillation in his mother; Congestive Heart Failure in his mother.  ROS:   Please see the history of present illness.     All other systems reviewed and are negative.  EKGs/Labs/Other Studies Reviewed:    The following studies were reviewed today:  labs from [place].   EKG:     Recent Labs: 03/19/2020: TSH 1.167 03/25/2020: Magnesium 2.1 04/15/2020: Hemoglobin 10.2; Platelets 546 06/10/2020: ALT 17; BUN 17; Creatinine, Ser 1.06; Potassium 5.1; Sodium 140  Recent Lipid Panel    Component Value Date/Time   CHOL 118 06/10/2020 0935   TRIG 105 06/10/2020 0935   HDL 38 (L) 06/10/2020 0935   CHOLHDL 3.1 06/10/2020 0935   LDLCALC 60 06/10/2020 0935     Risk Assessment/Calculations:       Physical Exam:    VS:  BP 114/66   Pulse 78   Ht '5\' 8"'  (1.727 m)   Wt 148 lb 3.2 oz (67.2 kg)   SpO2 99%   BMI 22.53 kg/m     Wt Readings from Last 3 Encounters:  07/18/20 148 lb 3.2 oz (67.2 kg)  06/29/20 142 lb (64.4 kg)  06/10/20 142 lb (64.4 kg)     GEN:  Well nourished, well developed in no acute distress HEENT: Normal NECK: No JVD; No carotid bruits LYMPHATICS: No lymphadenopathy CARDIAC: RRR, no murmurs, rubs, gallops,  Sternotomy is healing well  RESPIRATORY:  Clear to  auscultation without rales, wheezing or rhonchi  ABDOMEN: Soft, non-tender, non-distended MUSCULOSKELETAL:  No edema; No deformity  SKIN: Warm and dry NEUROLOGIC:  Alert and oriented x 3 PSYCHIATRIC:  Normal affect   ASSESSMENT:    No diagnosis found. PLAN:  In order of problems listed above:  1. CAD :   He status post coronary artery bypass grafting.  He seems to be healing nicely.  He is not having any angina.  2.  Hyperlipidemia: Continue with atorvastatin 80 mg a day.  He is not having any angina.  His labs have been stable.    Shared Decision Making/Informed Consent      Medication Adjustments/Labs and Tests Ordered: Current medicines are reviewed at length with the patient today.  Concerns regarding medicines are outlined above.  No orders of the defined types were placed in this encounter.  No orders of the defined types were placed in this encounter.   Patient Instructions  Medication Instructions:  Your physician recommends that you continue on your current medications as directed. Please refer to the Current Medication list given to you today.  *If you need a refill on your cardiac medications before your next appointment, please call your pharmacy*   Lab Work: none If you have labs (blood work) drawn today and your tests are completely normal, you will receive your results only by: Marland Kitchen MyChart Message (if you have MyChart) OR . A paper copy in the mail If you have any lab test that is abnormal or we need to change your treatment, we will call you to review the results.   Testing/Procedures: none   Follow-Up: At [facility], you and your health needs are our priority.  As part of our continuing mission to provide you with exceptional heart care, we have created designated Provider Care Teams.  These Care Teams include your primary Cardiologist (physician) and Advanced Practice Providers (APPs -  Physician Assistants and Nurse Practitioners) who all work  together to provide you with the care you need, when you need it.  We recommend signing up for the patient portal called "MyChart".  Sign up information is provided on this After Visit Summary.  MyChart is used to connect with patients for Virtual Visits (Telemedicine).  Patients are able to view lab/test results, encounter notes, upcoming appointments, etc.  Non-urgent messages can be sent to your provider as well.   To learn more about what you can do with MyChart, go to [email or web address].    Your next appointment:   12 month(s)  The format for your next appointment:   In Person  Provider:   Mertie Moores, MD   Other Instructions      Signed, Mertie Moores, MD  07/18/2020 10:42 AM    [place]

## 2020-07-18 ENCOUNTER — Other Ambulatory Visit: Payer: Self-pay

## 2020-07-18 ENCOUNTER — Encounter: Payer: Self-pay | Admitting: Cardiovascular Disease

## 2020-07-18 ENCOUNTER — Ambulatory Visit: Payer: Medicare Other | Admitting: Cardiovascular Disease

## 2020-07-18 VITALS — BP 114/66 | HR 78 | Ht 68.0 in | Wt 148.2 lb

## 2020-07-18 DIAGNOSIS — I2510 Atherosclerotic heart disease of native coronary artery without angina pectoris: Secondary | ICD-10-CM | POA: Diagnosis not present

## 2020-07-18 DIAGNOSIS — E782 Mixed hyperlipidemia: Secondary | ICD-10-CM

## 2020-07-18 NOTE — Patient Instructions (Signed)
Medication Instructions:  Your physician recommends that you continue on your current medications as directed. Please refer to the Current Medication list given to you today.  *If you need a refill on your cardiac medications before your next appointment, please call your pharmacy*   Lab Work: none If you have labs (blood work) drawn today and your tests are completely normal, you will receive your results only by: MyChart Message (if you have MyChart) OR A paper copy in the mail If you have any lab test that is abnormal or we need to change your treatment, we will call you to review the results.   Testing/Procedures: none   Follow-Up: At [facility], you and your health needs are our priority.  As part of our continuing mission to provide you with exceptional heart care, we have created designated Provider Care Teams.  These Care Teams include your primary Cardiologist (physician) and Advanced Practice Providers (APPs -  Physician Assistants and Nurse Practitioners) who all work together to provide you with the care you need, when you need it.  We recommend signing up for the patient portal called "MyChart".  Sign up information is provided on this After Visit Summary.  MyChart is used to connect with patients for Virtual Visits (Telemedicine).  Patients are able to view lab/test results, encounter notes, upcoming appointments, etc.  Non-urgent messages can be sent to your provider as well.   To learn more about what you can do with MyChart, go to [email or web address].    Your next appointment:   12 month(s)  The format for your next appointment:   In Person  Provider:   Philip Nahser, MD     Other Instructions    

## 2020-08-03 ENCOUNTER — Telehealth: Payer: Self-pay

## 2020-08-03 ENCOUNTER — Encounter: Payer: Self-pay | Admitting: Gastroenterology

## 2020-08-03 ENCOUNTER — Ambulatory Visit: Payer: Medicare Other | Admitting: Gastroenterology

## 2020-08-03 VITALS — BP 108/56 | HR 80 | Ht 69.0 in | Wt 150.0 lb

## 2020-08-03 DIAGNOSIS — D509 Iron deficiency anemia, unspecified: Secondary | ICD-10-CM

## 2020-08-03 DIAGNOSIS — R195 Other fecal abnormalities: Secondary | ICD-10-CM

## 2020-08-03 NOTE — Telephone Encounter (Signed)
[facility] Pre-operative Risk Assessment     Request for surgical clearance:     Endoscopy Procedure  What type of surgery is being performed?     EGD/Colon  When is this surgery scheduled?     Not yet scheduled   What type of clearance is required ?   Pharmacy  Are there any medications that need to be held prior to surgery and how long? Plavix 5 days   Practice name and name of physician performing surgery?      [facility]  What is your office phone and fax number?      Phone- [phone]  [phone]  Anesthesia type (None, local, MAC, general) ?       MAC

## 2020-08-03 NOTE — Patient Instructions (Signed)
If you are age 67 or older, your body mass index should be between 23-30. Your Body mass index is 22.15 kg/m. If this is out of the aforementioned range listed, please consider follow up with your Primary Care Provider.  If you are age 25 or younger, your body mass index should be between 19-25. Your Body mass index is 22.15 kg/m. If this is out of the aformentioned range listed, please consider follow up with your Primary Care Provider.   It has been recommended to you by your physician that you have a(n) EGD/Colonoscopy completed.We did not schedule the procedure(s) today. Our office will contact you once we receive your cardiac clearance from your cardiologist.   You will be contacted by our office prior to your procedure for directions on holding your Plavix.  If you do not hear from our office 1 week prior to your scheduled procedure, please call [phone] to discuss.   Please go to the lab at [facility] ([place] You will need to go to level "B", you do not need an appointment for this. Hours available are 7:30 am - 4:30 pm.  You can do this on the day your procedure is scheduled.   Thank you,  Dr. Jackquline Denmark

## 2020-08-03 NOTE — Progress Notes (Signed)
Chief Complaint: FU  Referring Provider:  [name], MD      ASSESSMENT AND PLAN;   #1. IDA with FOBT + with chronic anemia. Neg colon in [place] 13-82yrs ago.  #2. CAD s/p CABG July 2021 with CHF (EF 40-45%), DM2, CKD2, HTN, HLD  Plan: -EGD/Colon with miralax after cardio clearence and holding plavix 5 days before. Can continue ASA -CBC, CMP, iron studies, B12/folate @ time of endoscopic procedures.   Discussed risks & benefits of endoscopic procedures. (Risks including rare perforation req laparotomy, bleeding after bx/polypectomy req blood transfusion, rarely missing neoplasms, risks of anesthesia/sedation). Benefits outweigh the risks. Patient agrees to proceed. All the questions were answered. Consent forms given for review.  He is at higher than baseline risk d/t AC   HPI:    Jimmy Blankenship is a 69 y.o. male  Who speaks [name]  FU from hospitalization 03/2020 for non-STEMI.  Cath showing triple-vessel disease.  He underwent CABG. had heme positive stools with IDA.  No active GI bleeding.  Here for further GI work-up.  No nausea, vomiting, heartburn, regurgitation, odynophagia or dysphagia.  No significant diarrhea or constipation. No melena or hematochezia. No unintentional weight loss. No abdominal pain.  No sodas, chocolates, chewing gums, artificial sweeteners and candy. No NSAIDs  Had colonoscopy done in [place] 13 to 14 years ago.    Past Medical History:  Diagnosis Date  . CKD (chronic kidney disease)   . Coronary Artery Disease s/p CABG    hx of PCI in 2000s in [place] // s/p NSTEMI in 7/21 >> CABG  . Diabetes mellitus 2   . Heart failure with reduced ejection fraction    Ischemic CM // Echo 7/21 - EF 35 // Intraop TEE 7/21: EF 40-45 // Echocardiogram 9/21: apical AK, EF 40-45, Gr 2 DD, trace AI, mild MR, mild LAE, normal RVSF, no pericardial effusion  . High cholesterol   . Hypertension   . Iron deficiency anemia    Heme + stools (during  admit for NSTEMI in 7/21)  . Palpitations    Event monitor 10/21: NSR, rare PVCs, rare 4 beats NSVT, rare brief non-sustained SVT    Past Surgical History:  Procedure Laterality Date  . COLONOSCOPY     around 2013. Normal per patient. [place]  . CORONARY ANGIOPLASTY WITH STENT PLACEMENT    . CORONARY ARTERY BYPASS GRAFT N/A 03/24/2020   Procedure: CORONARY ARTERY BYPASS GRAFTING (CABG), ON PUMP, TIMES THREE, USING LEFT INTERNAL MAMMARY ARTERY AND RIGHT ENDOSCOPICALLY HARVESTED GREATER SAPHENOUS VEIN;  Surgeon: Ivin Poot, MD;  Location: [place];  Service: Open Heart Surgery;  Laterality: N/A;  . LEFT HEART CATH AND CORONARY ANGIOGRAPHY N/A 03/17/2020   Procedure: LEFT HEART CATH AND CORONARY ANGIOGRAPHY;  Surgeon: Belva Crome, MD;  Location: [place] [facility];  Service: Cardiovascular;  Laterality: N/A;  . TEE WITHOUT CARDIOVERSION N/A 03/24/2020   Procedure: TRANSESOPHAGEAL ECHOCARDIOGRAM (TEE);  Surgeon: [name], Collier Salina, MD;  Location: [place];  Service: Open Heart Surgery;  Laterality: N/A;    Family History  Problem Relation Age of Onset  . Atrial fibrillation Mother   . Congestive Heart Failure Mother   . Cancer Neg Hx     Social History   Tobacco Use  . Smoking status: Former Research scientist (life sciences)  . Smokeless tobacco: Never Used  . Tobacco comment: quit 2008  Vaping Use  . Vaping Use: Never used  Substance Use Topics  . Alcohol use:  Never  . Drug use: Never    Current Outpatient Medications  Medication Sig Dispense Refill  . aspirin EC 81 MG tablet Take 1 tablet (81 mg total) by mouth daily. 30 tablet 1  . atorvastatin (LIPITOR) 80 MG tablet Take 1 tablet (80 mg total) by mouth daily. 90 tablet 3  . clopidogrel (PLAVIX) 75 MG tablet Take 75 mg by mouth daily.    . Cyanocobalamin (VITAMIN B-12 PO) Take 1 tablet by mouth daily.    Marland Kitchen gabapentin (NEURONTIN) 300 MG capsule Take 300 mg by mouth daily.    Marland Kitchen lisinopril (ZESTRIL) 2.5 MG tablet Take 1 tablet (2.5 mg total) by mouth daily.  90 tablet 3  . metFORMIN (GLUCOPHAGE) 1000 MG tablet Take 1,000 mg by mouth 2 (two) times daily.    . metoprolol tartrate (LOPRESSOR) 50 MG tablet Take 1 tablet (50 mg total) by mouth 2 (two) times daily. 180 tablet 1   No current facility-administered medications for this visit.    No Known Allergies  Review of Systems:  Constitutional: Denies fever, chills, diaphoresis, appetite change and fatigue.  HEENT: Denies photophobia, eye pain, redness, hearing loss, ear pain, congestion, sore throat, rhinorrhea, sneezing, mouth sores, neck pain, neck stiffness and tinnitus.   Respiratory: Denies SOB, DOE, cough, chest tightness,  and wheezing.   Cardiovascular: Denies chest pain, palpitations and leg swelling.  Genitourinary: Denies dysuria, urgency, frequency, hematuria, flank pain and difficulty urinating.  Musculoskeletal: Denies myalgias, back pain, joint swelling, arthralgias and gait problem.  Skin: No rash.  Neurological: Denies dizziness, seizures, syncope, weakness, light-headedness, numbness and headaches.  Hematological: Denies adenopathy. Easy bruising, personal or family bleeding history  Psychiatric/Behavioral: No anxiety or depression     Physical Exam:    BP (!) 108/56   Pulse 80   Ht 5\' 9"  (1.753 m)   Wt 150 lb (68 kg)   BMI 22.15 kg/m  Wt Readings from Last 3 Encounters:  08/03/20 150 lb (68 kg)  07/18/20 148 lb 3.2 oz (67.2 kg)  06/29/20 142 lb (64.4 kg)   Constitutional:  Well-developed, in no acute distress. Psychiatric: Normal mood and affect. Behavior is normal. HEENT: Pupils normal.  Conjunctivae are normal. No scleral icterus. Neck supple.  Cardiovascular: Normal rate, regular rhythm. No edema Pulmonary/chest: Effort normal and breath sounds normal. No wheezing, rales or rhonchi. Abdominal: Soft, nondistended. Nontender. Bowel sounds active throughout. There are no masses palpable. No hepatomegaly. Rectal: Deferred Neurological: Alert and oriented to  person place and time. Skin: Skin is warm and dry. No rashes noted.  Data Reviewed: I have personally reviewed following labs and imaging studies  CBC: CBC Latest Ref Rng & Units 04/15/2020 03/29/2020 03/28/2020  WBC 3.4 - 10.8 x10E3/uL 7.4 4.8 5.4  Hemoglobin 13.0 - 17.7 g/dL 10.2(L) 10.0(L) 9.8(L)  Hematocrit 37.5 - 51.0 % 32.2(L) 31.8(L) 31.5(L)  Platelets 150 - 450 x10E3/uL 546(H) 282 233    CMP: CMP Latest Ref Rng & Units 06/10/2020 04/15/2020 03/29/2020  Glucose 65 - 99 mg/dL 98 110(H) 102(H)  BUN 8 - 27 mg/dL 17 17 11   Creatinine 0.76 - 1.27 mg/dL 1.06 1.08 1.28(H)  Sodium 134 - 144 mmol/L 140 135 137  Potassium 3.5 - 5.2 mmol/L 5.1 5.1 4.2  Chloride 96 - 106 mmol/L 105 98 101  CO2 20 - 29 mmol/L 22 22 27   Calcium 8.6 - 10.2 mg/dL 9.1 9.6 8.4(L)  Total Protein 6.0 - 8.5 g/dL 6.1 - -  Total Bilirubin 0.0 - 1.2 mg/dL 0.3 - -  Alkaline Phos 44 - 121 IU/L 98 - -  AST 0 - 40 IU/L 15 - -  ALT 0 - 44 IU/L 17 - -      Carmell Austria, MD 08/03/2020, 9:16 AM  Cc: [name], MD

## 2020-08-03 NOTE — Telephone Encounter (Signed)
Hi Dr. Acie Fredrickson,   [name] has an upcoming EGD/colonoscopy planned and will need to hold Plavix. He has a history of CAD and underwent CABG x3 in 03/2020. He was recently seen by you on 07/18/2020 and was doing well. Can you please comment on if he can hold Plavix for 7 days prior to EGD/colonoscopy?  Please route response back to P CV DIV PREOP.  Thank you! Faithann Natal

## 2020-08-04 NOTE — Telephone Encounter (Signed)
The patient may hold his Plavix for 7 days prior to endoscopy procedure   He is at low risk from a cardiac standpoint

## 2020-08-09 ENCOUNTER — Telehealth: Payer: Self-pay | Admitting: Cardiovascular Disease

## 2020-08-09 NOTE — Telephone Encounter (Signed)
Pt c/o Shortness Of Breath: STAT if SOB developed within the last 24 hours or pt is noticeably SOB on the phone  1. Are you currently SOB (can you hear that pt is SOB on the phone)? Yes   2. How long have you been experiencing SOB? Started yesterday   3. Are you SOB when sitting or when up moving around? Moving around   4. Are you currently experiencing any other symptoms? Dizziness and Hypotension   Jimmy Blankenship is calling stating Jimmy Blankenship called her and said he had an episode of SOB, Dizziness, and Hypotension yesterday. She states she is not with him now, but feels he needs an appointment in regards to this. She states you can contact Jimmy Blankenship directly at the number [phone] with an interpreter for further information. Please advise.

## 2020-08-09 NOTE — Telephone Encounter (Signed)
Called Judson Roch back about message. Made patient an appointment with Dr. Acie Fredrickson, next available, Friday. She said patient if fine right now. Informed her that if patient's symptoms come back and/or worse to go to the ED. Patient's daughter verbalized understanding.

## 2020-08-10 ENCOUNTER — Telehealth: Payer: Self-pay

## 2020-08-10 NOTE — Telephone Encounter (Signed)
Called patient's daughter to let her know that we have received the cardiac clearance for patient it hold Plavix 7 days before procedure when we are able to schedule him and she told me that that 2 days ago he had low bp and dizziness and that today he has had dark stool. She said that he seems a little pale and that he is having low energy to her. She said that they have already notified PCP and he sees a cardiologist 12-3. Is there something we need we need to do for him she wants to know?

## 2020-08-11 ENCOUNTER — Encounter: Payer: Self-pay | Admitting: Gastroenterology

## 2020-08-11 ENCOUNTER — Encounter: Payer: Self-pay | Admitting: Cardiovascular Disease

## 2020-08-11 NOTE — Telephone Encounter (Signed)
Proceed with EGD/colonoscopy after holding Plavix 5 days before (not 7) RG

## 2020-08-11 NOTE — Telephone Encounter (Signed)
Spoke to patient's wife who reports patient is having low blood pressure and energy. He feels dizzy at times and, continues to have dark stools. Patient was seen in office last month and was recommended EGD/Colon after receiving cardiac clearance. Patient has an appointment with his Cardiologist on 08/12/20.(already cleared to hold Plavix 7 days before procedure) .Procedure will be scheduled with labs prior pending cardiology appointment. Larena Glassman will you schedule procedure accordingly. Thanks

## 2020-08-11 NOTE — Telephone Encounter (Signed)
Pt daughter wants to speak to nurse, made appt for pt 09/22/20

## 2020-08-11 NOTE — Progress Notes (Signed)
Cardiology Office Note:    Date:  08/12/2020   ID:  Jimmy Blankenship, Jimmy Blankenship [birth year], MRN [id number]  PCP:  [name], MD  [facility] Cardiologist:  Mertie Moores, MD  [place] Electrophysiologist:  None   Referring MD: [name], MD   Chief Complaint  Patient presents with  . Coronary Artery Disease     Nov. 8, 2021   Jimmy Blankenship is a 69 y.o. male with a hx of 3 V CAD, s/p CABG, CHF, GI bleed,   I met him in the hospital in July, 2021 when he presented with a NSTEMI. Had CABG [name])  Has been seen by Richardson Dopp, PA in the office  Overall is doing well.   No further angina  Has some chest wall soreness,  Nothing out of the ordinary  Is getting some regular exerxise.  Going to [facility] cardiac rehab program .   Labs from [place] 1, 2021 were reviewed Total chol = 118 HDL = 38 Trigs = 105 LDL = 60  He gets labs from his primary MD Osborne Oman )   Dec. 3, 2021: [name] is seen today for follow up of his CAD.  Seen with daughter, Judson Roch. Hx of CAD, CABG. Has some DOE that is new since last Monday  His stool has been dark, marroon colored this week  Has seen GI They wanted [place] see prior to endoscopy    Has also developed a cough - seems to be related to Lisinopril .    Past Medical History:  Diagnosis Date  . CKD (chronic kidney disease)   . Coronary Artery Disease s/p CABG    hx of PCI in 2000s in [place] // s/p NSTEMI in 7/21 >> CABG  . Diabetes mellitus 2   . Heart failure with reduced ejection fraction    Ischemic CM // Echo 7/21 - EF 35 // Intraop TEE 7/21: EF 40-45 // Echocardiogram 9/21: apical AK, EF 40-45, Gr 2 DD, trace AI, mild MR, mild LAE, normal RVSF, no pericardial effusion  . High cholesterol   . Hypertension   . Iron deficiency anemia    Heme + stools (during admit for NSTEMI in 7/21)  . Palpitations    Event monitor 10/21: NSR, rare PVCs, rare 4 beats NSVT, rare brief non-sustained SVT    Past Surgical History:    Procedure Laterality Date  . COLONOSCOPY     around 2013. Normal per patient. [place]  . CORONARY ANGIOPLASTY WITH STENT PLACEMENT    . CORONARY ARTERY BYPASS GRAFT N/A 03/24/2020   Procedure: CORONARY ARTERY BYPASS GRAFTING (CABG), ON PUMP, TIMES THREE, USING LEFT INTERNAL MAMMARY ARTERY AND RIGHT ENDOSCOPICALLY HARVESTED GREATER SAPHENOUS VEIN;  Surgeon: Ivin Poot, MD;  Location: [place];  Service: Open Heart Surgery;  Laterality: N/A;  . LEFT HEART CATH AND CORONARY ANGIOGRAPHY N/A 03/17/2020   Procedure: LEFT HEART CATH AND CORONARY ANGIOGRAPHY;  Surgeon: Belva Crome, MD;  Location: [place] [facility];  Service: Cardiovascular;  Laterality: N/A;  . TEE WITHOUT CARDIOVERSION N/A 03/24/2020   Procedure: TRANSESOPHAGEAL ECHOCARDIOGRAM (TEE);  Surgeon: [name], Collier Salina, MD;  Location: [place];  Service: Open Heart Surgery;  Laterality: N/A;    Current Medications: Current Meds  Medication Sig  . atorvastatin (LIPITOR) 80 MG tablet Take 1 tablet (80 mg total) by mouth daily.  . Cyanocobalamin (VITAMIN B-12 PO) Take 1 tablet by mouth daily.  Marland Kitchen gabapentin (NEURONTIN) 300 MG  capsule Take 300 mg by mouth daily.  . metFORMIN (GLUCOPHAGE) 1000 MG tablet Take 1,000 mg by mouth 2 (two) times daily.  . metoprolol tartrate (LOPRESSOR) 50 MG tablet Take 1 tablet (50 mg total) by mouth 2 (two) times daily.  . Multiple Vitamins-Minerals (PRESERVISION AREDS PO) Take 1 tablet by mouth daily.  . [DISCONTINUED] aspirin EC 81 MG tablet Take 1 tablet (81 mg total) by mouth daily.  . [DISCONTINUED] clopidogrel (PLAVIX) 75 MG tablet Take 75 mg by mouth daily.  . [DISCONTINUED] lisinopril (ZESTRIL) 2.5 MG tablet Take 1 tablet (2.5 mg total) by mouth daily.     Allergies:   Patient has no known allergies.   Social History   Socioeconomic History  . Marital status: Married    Spouse name: Not on file  . Number of children: Not on file  . Years of education: Not on file  . Highest education level: Not on  file  Occupational History  . Not on file  Tobacco Use  . Smoking status: Former Research scientist (life sciences)  . Smokeless tobacco: Never Used  . Tobacco comment: quit 2008  Vaping Use  . Vaping Use: Never used  Substance and Sexual Activity  . Alcohol use: Never  . Drug use: Never  . Sexual activity: Not on file  Other Topics Concern  . Not on file  Social History Narrative  . Not on file   Social Determinants of Health   Financial Resource Strain:   . Difficulty of Paying Living Expenses: Not on file  Food Insecurity:   . Worried About Charity fundraiser in the Last Year: Not on file  . Ran Out of Food in the Last Year: Not on file  Transportation Needs:   . Lack of Transportation (Medical): Not on file  . Lack of Transportation (Non-Medical): Not on file  Physical Activity:   . Days of Exercise per Week: Not on file  . Minutes of Exercise per Session: Not on file  Stress:   . Feeling of Stress : Not on file  Social Connections:   . Frequency of Communication with Friends and Family: Not on file  . Frequency of Social Gatherings with Friends and Family: Not on file  . Attends Religious Services: Not on file  . Active Member of Clubs or Organizations: Not on file  . Attends Archivist Meetings: Not on file  . Marital Status: Not on file     Family History: The patient's family history includes Atrial fibrillation in his mother; Congestive Heart Failure in his mother. There is no history of Cancer.  ROS:   Please see the history of present illness.     All other systems reviewed and are negative.  EKGs/Labs/Other Studies Reviewed:    The following studies were reviewed today:  labs from [place].   EKG:     Recent Labs: 03/19/2020: TSH 1.167 03/25/2020: Magnesium 2.1 04/15/2020: Hemoglobin 10.2; Platelets 546 06/10/2020: ALT 17; BUN 17; Creatinine, Ser 1.06; Potassium 5.1; Sodium 140  Recent Lipid Panel    Component Value Date/Time   CHOL 118 06/10/2020 0935   TRIG 105  06/10/2020 0935   HDL 38 (L) 06/10/2020 0935   CHOLHDL 3.1 06/10/2020 0935   LDLCALC 60 06/10/2020 0935     Risk Assessment/Calculations:       Physical Exam:    Physical Exam: Blood pressure 110/62, pulse 96, height '5\' 9"'  (1.753 m), weight 143 lb 12.8 oz (65.2 kg), SpO2 100 %.  GEN:  Well nourished, well developed in no acute distress HEENT: Normal NECK: No JVD; No carotid bruits LYMPHATICS: No lymphadenopathy CARDIAC: RRR  RESPIRATORY:  Clear to auscultation without rales, wheezing or rhonchi  ABDOMEN: Soft, non-tender, non-distended MUSCULOSKELETAL:  No edema; No deformity  SKIN: jaundiced , pale NEUROLOGIC:  Alert and oriented x 3   ASSESSMENT:    1. Essential hypertension   2. DOE (dyspnea on exertion)    PLAN:      1. CAD :    No angina    2.  DOE:   The patient presents with progressive shortness of breath for the past week.  He states that he is had black tarry stools since Monday.  He has known history of iron deficient anemia.  He has been on aspirin Plavix following his bypass surgery.  I think that he needs fairly urgent endoscopy and colonoscopy.  We will stop his aspirin and Plavix.  Its been 6 months since the surgery so he should be at low risk for stopping these medications.  He is at low risk for any upcoming endoscopy or colonoscopy procedure.  He is also having cough that may be due to his lisinopril.  We will discontinue the lisinopril.  If he becomes hypertensive we will anticipate starting him on losartan.  Shared Decision Making/Informed Consent      Medication Adjustments/Labs and Tests Ordered: Current medicines are reviewed at length with the patient today.  Concerns regarding medicines are outlined above.  Orders Placed This Encounter  Procedures  . CBC   No orders of the defined types were placed in this encounter.   Patient Instructions  Medication Instructions:  Your physician has recommended you make the following change in  your medication:  1) STOP taking lisinopirl 2) STOP taking aspirin 3) STOP taking plavix  *If you need a refill on your cardiac medications before your next appointment, please call your pharmacy*   Lab Work: TODAY: CBC If you have labs (blood work) drawn today and your tests are completely normal, you will receive your results only by: Marland Kitchen MyChart Message (if you have MyChart) OR . A paper copy in the mail If you have any lab test that is abnormal or we need to change your treatment, we will call you to review the results.  Follow-Up: At [facility], you and your health needs are our priority.  As part of our continuing mission to provide you with exceptional heart care, we have created designated Provider Care Teams.  These Care Teams include your primary Cardiologist (physician) and Advanced Practice Providers (APPs -  Physician Assistants and Nurse Practitioners) who all work together to provide you with the care you need, when you need it.  Your next appointment:   2-3 month(s)  The format for your next appointment:   In Person  Provider:   You will see one of the following Advanced Practice Providers on your designated Care Team:    Richardson Dopp, PA-C  Robbie Lis, [place]      Signed, Mertie Moores, MD  08/12/2020 10:36 AM    [place]

## 2020-08-12 ENCOUNTER — Ambulatory Visit: Payer: Medicare Other | Admitting: Cardiovascular Disease

## 2020-08-12 ENCOUNTER — Encounter: Payer: Self-pay | Admitting: Cardiovascular Disease

## 2020-08-12 ENCOUNTER — Other Ambulatory Visit: Payer: Self-pay

## 2020-08-12 VITALS — BP 110/62 | HR 96 | Ht 69.0 in | Wt 143.8 lb

## 2020-08-12 DIAGNOSIS — I10 Essential (primary) hypertension: Secondary | ICD-10-CM | POA: Diagnosis not present

## 2020-08-12 DIAGNOSIS — R0600 Dyspnea, unspecified: Secondary | ICD-10-CM | POA: Diagnosis not present

## 2020-08-12 DIAGNOSIS — R0609 Other forms of dyspnea: Secondary | ICD-10-CM | POA: Insufficient documentation

## 2020-08-12 NOTE — Patient Instructions (Signed)
Medication Instructions:  Your physician has recommended you make the following change in your medication:  1) STOP taking lisinopirl 2) STOP taking aspirin 3) STOP taking plavix  *If you need a refill on your cardiac medications before your next appointment, please call your pharmacy*   Lab Work: TODAY: CBC If you have labs (blood work) drawn today and your tests are completely normal, you will receive your results only by: Marland Kitchen MyChart Message (if you have MyChart) OR . A paper copy in the mail If you have any lab test that is abnormal or we need to change your treatment, we will call you to review the results.  Follow-Up: At [facility], you and your health needs are our priority.  As part of our continuing mission to provide you with exceptional heart care, we have created designated Provider Care Teams.  These Care Teams include your primary Cardiologist (physician) and Advanced Practice Providers (APPs -  Physician Assistants and Nurse Practitioners) who all work together to provide you with the care you need, when you need it.  Your next appointment:   2-3 month(s)  The format for your next appointment:   In Person  Provider:   You will see one of the following Advanced Practice Providers on your designated Care Team:    Richardson Dopp, PA-C  [place], [place]

## 2020-08-12 NOTE — Telephone Encounter (Signed)
Scheduled for 09/19/2020 with patient's daughter and mailed instructions. Told patient's daughter to call in january if she hasn't gotten the results. Patient is off plavix and asa per cardiologist and patient has been vaxx as well per daughter. Told her to call with any questions

## 2020-08-13 ENCOUNTER — Other Ambulatory Visit: Payer: Self-pay

## 2020-08-13 ENCOUNTER — Inpatient Hospital Stay (HOSPITAL_BASED_OUTPATIENT_CLINIC_OR_DEPARTMENT_OTHER)
Admission: EM | Admit: 2020-08-13 | Discharge: 2020-08-16 | DRG: 375 | Disposition: A | Discharge status: Home / Self Care | Payer: Medicare Other | Attending: Internal Medicine | Admitting: Internal Medicine

## 2020-08-13 ENCOUNTER — Telehealth: Payer: Self-pay | Admitting: Cardiology

## 2020-08-13 ENCOUNTER — Encounter (HOSPITAL_BASED_OUTPATIENT_CLINIC_OR_DEPARTMENT_OTHER): Payer: Self-pay | Admitting: Emergency Medicine

## 2020-08-13 DIAGNOSIS — D649 Anemia, unspecified: Principal | ICD-10-CM | POA: Diagnosis present

## 2020-08-13 DIAGNOSIS — Z955 Presence of coronary angioplasty implant and graft: Secondary | ICD-10-CM | POA: Diagnosis not present

## 2020-08-13 DIAGNOSIS — I252 Old myocardial infarction: Secondary | ICD-10-CM | POA: Diagnosis not present

## 2020-08-13 DIAGNOSIS — N289 Disorder of kidney and ureter, unspecified: Secondary | ICD-10-CM | POA: Diagnosis present

## 2020-08-13 DIAGNOSIS — C168 Malignant neoplasm of overlapping sites of stomach: Secondary | ICD-10-CM | POA: Diagnosis present

## 2020-08-13 DIAGNOSIS — K921 Melena: Secondary | ICD-10-CM | POA: Diagnosis present

## 2020-08-13 DIAGNOSIS — Z7984 Long term (current) use of oral hypoglycemic drugs: Secondary | ICD-10-CM

## 2020-08-13 DIAGNOSIS — Z951 Presence of aortocoronary bypass graft: Secondary | ICD-10-CM | POA: Diagnosis not present

## 2020-08-13 DIAGNOSIS — Z66 Do not resuscitate: Secondary | ICD-10-CM | POA: Diagnosis present

## 2020-08-13 DIAGNOSIS — I110 Hypertensive heart disease with heart failure: Secondary | ICD-10-CM | POA: Diagnosis present

## 2020-08-13 DIAGNOSIS — D125 Benign neoplasm of sigmoid colon: Secondary | ICD-10-CM | POA: Diagnosis present

## 2020-08-13 DIAGNOSIS — E1142 Type 2 diabetes mellitus with diabetic polyneuropathy: Secondary | ICD-10-CM

## 2020-08-13 DIAGNOSIS — I5022 Chronic systolic (congestive) heart failure: Secondary | ICD-10-CM | POA: Diagnosis present

## 2020-08-13 DIAGNOSIS — E119 Type 2 diabetes mellitus without complications: Secondary | ICD-10-CM | POA: Diagnosis present

## 2020-08-13 DIAGNOSIS — Z8249 Family history of ischemic heart disease and other diseases of the circulatory system: Secondary | ICD-10-CM | POA: Diagnosis not present

## 2020-08-13 DIAGNOSIS — K640 First degree hemorrhoids: Secondary | ICD-10-CM | POA: Diagnosis present

## 2020-08-13 DIAGNOSIS — Z7902 Long term (current) use of antithrombotics/antiplatelets: Secondary | ICD-10-CM

## 2020-08-13 DIAGNOSIS — I10 Essential (primary) hypertension: Secondary | ICD-10-CM | POA: Diagnosis present

## 2020-08-13 DIAGNOSIS — K769 Liver disease, unspecified: Secondary | ICD-10-CM | POA: Diagnosis present

## 2020-08-13 DIAGNOSIS — E785 Hyperlipidemia, unspecified: Secondary | ICD-10-CM | POA: Diagnosis present

## 2020-08-13 DIAGNOSIS — I2510 Atherosclerotic heart disease of native coronary artery without angina pectoris: Secondary | ICD-10-CM | POA: Diagnosis present

## 2020-08-13 DIAGNOSIS — D500 Iron deficiency anemia secondary to blood loss (chronic): Secondary | ICD-10-CM

## 2020-08-13 DIAGNOSIS — Z20822 Contact with and (suspected) exposure to covid-19: Secondary | ICD-10-CM | POA: Diagnosis present

## 2020-08-13 DIAGNOSIS — Z87891 Personal history of nicotine dependence: Secondary | ICD-10-CM | POA: Diagnosis not present

## 2020-08-13 DIAGNOSIS — D62 Acute posthemorrhagic anemia: Secondary | ICD-10-CM | POA: Diagnosis present

## 2020-08-13 DIAGNOSIS — D490 Neoplasm of unspecified behavior of digestive system: Secondary | ICD-10-CM | POA: Diagnosis not present

## 2020-08-13 DIAGNOSIS — D123 Benign neoplasm of transverse colon: Secondary | ICD-10-CM

## 2020-08-13 DIAGNOSIS — R195 Other fecal abnormalities: Secondary | ICD-10-CM

## 2020-08-13 DIAGNOSIS — I255 Ischemic cardiomyopathy: Secondary | ICD-10-CM | POA: Diagnosis present

## 2020-08-13 DIAGNOSIS — D509 Iron deficiency anemia, unspecified: Secondary | ICD-10-CM

## 2020-08-13 DIAGNOSIS — K922 Gastrointestinal hemorrhage, unspecified: Secondary | ICD-10-CM | POA: Diagnosis present

## 2020-08-13 DIAGNOSIS — Z79899 Other long term (current) drug therapy: Secondary | ICD-10-CM | POA: Diagnosis not present

## 2020-08-13 LAB — CBC
Hematocrit: 20.8 % — ABNORMAL LOW (ref 37.5–51.0)
Hemoglobin: 5.4 g/dL — CL (ref 13.0–17.7)
MCH: 17.5 pg — ABNORMAL LOW (ref 26.6–33.0)
MCHC: 26 g/dL — ABNORMAL LOW (ref 31.5–35.7)
MCV: 68 fL — ABNORMAL LOW (ref 79–97)
Platelets: 433 10*3/uL (ref 150–450)
RBC: 3.08 x10E6/uL — ABNORMAL LOW (ref 4.14–5.80)
RDW: 18.9 % — ABNORMAL HIGH (ref 11.6–15.4)
WBC: 6.7 10*3/uL (ref 3.4–10.8)

## 2020-08-13 LAB — COMPREHENSIVE METABOLIC PANEL
ALT: 16 U/L (ref 0–44)
AST: 16 U/L (ref 15–41)
Albumin: 3.2 g/dL — ABNORMAL LOW (ref 3.5–5.0)
Alkaline Phosphatase: 58 U/L (ref 38–126)
Anion gap: 8 (ref 5–15)
BUN: 24 mg/dL — ABNORMAL HIGH (ref 8–23)
CO2: 21 mmol/L — ABNORMAL LOW (ref 22–32)
Calcium: 8.5 mg/dL — ABNORMAL LOW (ref 8.9–10.3)
Chloride: 106 mmol/L (ref 98–111)
Creatinine, Ser: 1.06 mg/dL (ref 0.61–1.24)
GFR, Estimated: >60 mL/min (ref >60)
Glucose, Bld: 112 mg/dL — ABNORMAL HIGH (ref 70–99)
Potassium: 3.9 mmol/L (ref 3.5–5.1)
Sodium: 135 mmol/L (ref 135–145)
Total Bilirubin: 0.5 mg/dL (ref 0.3–1.2)
Total Protein: 5.9 g/dL — ABNORMAL LOW (ref 6.5–8.1)

## 2020-08-13 LAB — VITAMIN B12: Vitamin B-12: 2781 pg/mL — ABNORMAL HIGH (ref 180–914)

## 2020-08-13 LAB — CBC WITH DIFFERENTIAL/PLATELET
Abs Immature Granulocytes: 0.02 10*3/uL (ref 0.00–0.07)
Basophils Absolute: 0.1 10*3/uL (ref 0.0–0.1)
Basophils Relative: 1 %
Eosinophils Absolute: 0.3 10*3/uL (ref 0.0–0.5)
Eosinophils Relative: 5 %
HCT: 19.1 % — ABNORMAL LOW (ref 39.0–52.0)
Hemoglobin: 5.5 g/dL — CL (ref 13.0–17.0)
Immature Granulocytes: 0 %
Lymphocytes Relative: 28 %
Lymphs Abs: 1.9 10*3/uL (ref 0.7–4.0)
MCH: 18.6 pg — ABNORMAL LOW (ref 26.0–34.0)
MCHC: 28.8 g/dL — ABNORMAL LOW (ref 30.0–36.0)
MCV: 64.7 fL — ABNORMAL LOW (ref 80.0–100.0)
Monocytes Absolute: 0.7 10*3/uL (ref 0.1–1.0)
Monocytes Relative: 10 %
Neutro Abs: 3.8 10*3/uL (ref 1.7–7.7)
Neutrophils Relative %: 56 %
Platelets: 347 10*3/uL (ref 150–400)
RBC: 2.95 MIL/uL — ABNORMAL LOW (ref 4.22–5.81)
RDW: 21.3 % — ABNORMAL HIGH (ref 11.5–15.5)
Smear Review: NORMAL
WBC: 6.7 10*3/uL (ref 4.0–10.5)
nRBC: 0.4 % — ABNORMAL HIGH (ref 0.0–0.2)

## 2020-08-13 LAB — IRON AND TIBC
Iron: 16 ug/dL — ABNORMAL LOW (ref 45–182)
Saturation Ratios: 4 % — ABNORMAL LOW (ref 17.9–39.5)
TIBC: 375 ug/dL (ref 250–450)
UIBC: 359 ug/dL

## 2020-08-13 LAB — FERRITIN: Ferritin: 5 ng/mL — ABNORMAL LOW (ref 24–336)

## 2020-08-13 LAB — PREPARE RBC (CROSSMATCH)

## 2020-08-13 LAB — RESP PANEL BY RT-PCR (FLU A&B, COVID) ARPGX2
Influenza A by PCR: NEGATIVE
Influenza B by PCR: NEGATIVE
SARS Coronavirus 2 by RT PCR: NEGATIVE

## 2020-08-13 LAB — RETICULOCYTES
Immature Retic Fract: 29.9 % — ABNORMAL HIGH (ref 2.3–15.9)
RBC.: 2.66 MIL/uL — ABNORMAL LOW (ref 4.22–5.81)
Retic Count, Absolute: 58 10*3/uL (ref 19.0–186.0)
Retic Ct Pct: 2.2 % (ref 0.4–3.1)

## 2020-08-13 LAB — HIV ANTIBODY (ROUTINE TESTING W REFLEX): HIV Screen 4th Generation wRfx: NONREACTIVE

## 2020-08-13 LAB — FOLATE: Folate: 7.7 ng/mL (ref >5.9)

## 2020-08-13 LAB — GLUCOSE, CAPILLARY
Glucose-Capillary: 69 mg/dL — ABNORMAL LOW (ref 70–99)
Glucose-Capillary: 84 mg/dL (ref 70–99)
Glucose-Capillary: 177 mg/dL — ABNORMAL HIGH (ref 70–99)

## 2020-08-13 LAB — OCCULT BLOOD X 1 CARD TO LAB, STOOL: Fecal Occult Bld: NEGATIVE

## 2020-08-13 MED ORDER — HYDRALAZINE HCL 20 MG/ML IJ SOLN: 5.0000 mg | INTRAMUSCULAR | Status: DC | PRN

## 2020-08-13 MED ORDER — ACETAMINOPHEN 650 MG RE SUPP: 650.0000 mg | Freq: Four times a day (QID) | RECTAL | Status: DC | PRN

## 2020-08-13 MED ORDER — ATORVASTATIN CALCIUM 80 MG PO TABS
80.0000 mg | ORAL_TABLET | Freq: Every day | ORAL | Status: DC
  Administered 2020-08-13 – 2020-08-16 (×4): 80 mg via ORAL
  Filled 2020-08-13 (×4): qty 1

## 2020-08-13 MED ORDER — SODIUM CHLORIDE 0.9% IV SOLUTION
Freq: Once | INTRAVENOUS | Status: AC
  Filled 2020-08-13: qty 250

## 2020-08-13 MED ORDER — METOPROLOL TARTRATE 50 MG PO TABS
50.0000 mg | ORAL_TABLET | Freq: Two times a day (BID) | ORAL | Status: DC
  Administered 2020-08-13 – 2020-08-16 (×7): 50 mg via ORAL
  Filled 2020-08-13 (×7): qty 1

## 2020-08-13 MED ORDER — LACTATED RINGERS IV SOLN: INTRAVENOUS | Status: DC

## 2020-08-13 MED ORDER — ONDANSETRON HCL 4 MG/2ML IJ SOLN: 4.0000 mg | Freq: Four times a day (QID) | INTRAMUSCULAR | Status: DC | PRN

## 2020-08-13 MED ORDER — LACTATED RINGERS IV BOLUS
1000.0000 mL | Freq: Once | INTRAVENOUS | Status: AC
  Administered 2020-08-13: 1000 mL via INTRAVENOUS

## 2020-08-13 MED ORDER — ACETAMINOPHEN 325 MG PO TABS: 650.0000 mg | ORAL_TABLET | Freq: Four times a day (QID) | ORAL | Status: DC | PRN

## 2020-08-13 MED ORDER — GABAPENTIN 300 MG PO CAPS
300.0000 mg | ORAL_CAPSULE | Freq: Every day | ORAL | Status: DC
  Administered 2020-08-13 – 2020-08-16 (×4): 300 mg via ORAL
  Filled 2020-08-13 (×4): qty 1

## 2020-08-13 MED ORDER — MORPHINE SULFATE (PF) 2 MG/ML IV SOLN
2.0000 mg | INTRAVENOUS | Status: DC | PRN
  Filled 2020-08-13: qty 1

## 2020-08-13 MED ORDER — PANTOPRAZOLE SODIUM 40 MG PO TBEC
40.0000 mg | DELAYED_RELEASE_TABLET | Freq: Two times a day (BID) | ORAL | Status: DC
  Administered 2020-08-13 – 2020-08-16 (×6): 40 mg via ORAL
  Filled 2020-08-13 (×7): qty 1

## 2020-08-13 MED ORDER — ONDANSETRON HCL 4 MG PO TABS: 4.0000 mg | ORAL_TABLET | Freq: Four times a day (QID) | ORAL | Status: DC | PRN

## 2020-08-13 MED ORDER — DEXTROSE 50 % IV SOLN
INTRAVENOUS | Status: AC
  Administered 2020-08-13: 25 mL
  Filled 2020-08-13: qty 50

## 2020-08-13 MED ORDER — SODIUM CHLORIDE 0.9 % IV SOLN
510.0000 mg | INTRAVENOUS | Status: DC
  Administered 2020-08-13: 510 mg via INTRAVENOUS
  Filled 2020-08-13: qty 17

## 2020-08-13 MED ORDER — INSULIN ASPART 100 UNIT/ML ~~LOC~~ SOLN
0.0000 [IU] | Freq: Three times a day (TID) | SUBCUTANEOUS | Status: DC
  Administered 2020-08-13: 3 [IU] via SUBCUTANEOUS

## 2020-08-13 NOTE — ED Notes (Signed)
Chaperoned for rectal  exam

## 2020-08-13 NOTE — Telephone Encounter (Signed)
[facility] called stating that patient had a critical lab with Hbg 5.4.  Attempted to call patient but could not get an answer and spoke with his daughter Judson Roch and instructed her to take him and go immediately to the ER for further evaluation.  His daughter agrees to take him now to [facility] ER.

## 2020-08-13 NOTE — Consult Note (Signed)
[facility] Consult: 9:12 AM 08/13/2020  LOS: 0 days    Referring Provider: Dr Lorin Mercy  Primary Care Physician:  [name], MD Primary Gastroenterologist:  Dr. Maurene Capes [place] [phone]    Reason for Consultation:    Acute on chronic anemia.   HPI: Jimmy Blankenship is a 69 y.o. male.  PMH CAD.  PTCA, stenting in [place] 2010.  NSTEMI,  3 v CABG 03/2020.  LVEF 40 to 45%.  Chronic Plavix.  CKD 3. DM 2 on Glucophage.  Iron deficiency anemia 03/2020.  Colonoscopy 2013 in [place], reportedly normal.  Anemia, Hgb 7.7, FOBT positive, iron 27/ferritin 8.  Transfused 5 PRBC and received single dose Feraheme during CABG admission July 2021.  Later that month Hgb 10 and 10.2 in early August.  MCVs in the 80s.  CEA 2.1 At office follow-up 08/03/2020 Dr. Lyndel Safe arranged EGD and colonoscopy for 09/19/2020.  To be performed after 5-day Plavix holiday. Meds at home include low-dose aspirin, daily oral B12 but no iron supplement  Starting Monday patient felt more tired than usual and stools were a bit darker.  However on Wednesday he had 1 black, formed stool.  On Thursday he had 2 or 3 bowel movements which started off black but by the end of the day were brown.  Brown stool yesterday.  Tachycardia within the last 48 hours.  No presyncope/syncope/dizziness/falls.  No anorexia, no nausea/vomiting, no abdominal pain.  Good appetite.  No weight loss.  No use of NSAIDs.  Compliant with taking aspirin and Plavix, last doses were morning of 12/3. Note that these are not listed on his PTA med list.   Patient instructed to proceed to the [facility] ER when routine labs revealed critical Hgb level.  Hgb 5.4 >> 2 PRBCs ordered.  MCV 64.  Platelets 347.   Stool FOBT negative BUN mildly elevated at 24, previously 17 with stable  creatinine.  Patient is married.  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  No alcohol, no tobacco products.    Past Medical History:  Diagnosis Date  . CKD (chronic kidney disease)   . Coronary Artery Disease s/p CABG    hx of PCI in 2000s in [place] // s/p NSTEMI in 7/21 >> CABG  . Diabetes mellitus 2   . Heart failure with reduced ejection fraction    Ischemic CM // Echo 7/21 - EF 35 // Intraop TEE 7/21: EF 40-45 // Echocardiogram 9/21: apical AK, EF 40-45, Gr 2 DD, trace AI, mild MR, mild LAE, normal RVSF, no pericardial effusion  . High cholesterol   . Hypertension   . Iron deficiency anemia    Heme + stools (during admit for NSTEMI in 7/21)  . Palpitations    Event monitor 10/21: NSR, rare PVCs, rare 4 beats NSVT, rare brief non-sustained SVT    Past Surgical History:  Procedure Laterality Date  . COLONOSCOPY     around 2013. Normal per patient. [place]  . CORONARY ANGIOPLASTY WITH STENT PLACEMENT    .  CORONARY ARTERY BYPASS GRAFT N/A 03/24/2020   Procedure: CORONARY ARTERY BYPASS GRAFTING (CABG), ON PUMP, TIMES THREE, USING LEFT INTERNAL MAMMARY ARTERY AND RIGHT ENDOSCOPICALLY HARVESTED GREATER SAPHENOUS VEIN;  Surgeon: Ivin Poot, MD;  Location: [place];  Service: Open Heart Surgery;  Laterality: N/A;  . LEFT HEART CATH AND CORONARY ANGIOGRAPHY N/A 03/17/2020   Procedure: LEFT HEART CATH AND CORONARY ANGIOGRAPHY;  Surgeon: Belva Crome, MD;  Location: [place] [facility];  Service: Cardiovascular;  Laterality: N/A;  . TEE WITHOUT CARDIOVERSION N/A 03/24/2020   Procedure: TRANSESOPHAGEAL ECHOCARDIOGRAM (TEE);  Surgeon: [name], Collier Salina, MD;  Location: [place];  Service: Open Heart Surgery;  Laterality: N/A;    Prior to Admission medications   Medication Sig Start Date End Date Taking? Authorizing Provider  atorvastatin (LIPITOR) 80 MG tablet Take 1 tablet (80 mg total) by mouth daily. 04/15/20  Yes Weaver, Scott T, PA-C  Cyanocobalamin (VITAMIN  B-12 PO) Take 1 tablet by mouth daily.   Yes [provider]  gabapentin (NEURONTIN) 300 MG capsule Take 300 mg by mouth daily.   Yes [provider]  metFORMIN (GLUCOPHAGE) 1000 MG tablet Take 1,000 mg by mouth 2 (two) times daily. 05/09/20  Yes [provider]  metoprolol tartrate (LOPRESSOR) 50 MG tablet Take 1 tablet (50 mg total) by mouth 2 (two) times daily. 05/18/20  Yes Weaver, Scott T, PA-C    Scheduled Meds: . dextrose      . sodium chloride   Intravenous Once  . atorvastatin  80 mg Oral Daily  . gabapentin  300 mg Oral Daily  . insulin aspart  0-15 Units Subcutaneous TID WC  . metoprolol tartrate  50 mg Oral BID   Infusions: . lactated ringers     PRN Meds: acetaminophen **OR** acetaminophen, hydrALAZINE, morphine injection, ondansetron **OR** ondansetron (ZOFRAN) IV   Allergies as of 08/13/2020  . (No Known Allergies)    Family History  Problem Relation Age of Onset  . Atrial fibrillation Mother   . Congestive Heart Failure Mother   . Cancer Neg Hx     Social History   Socioeconomic History  . Marital status: Married    Spouse name: Not on file  . Number of children: Not on file  . Years of education: Not on file  . Highest education level: Not on file  Occupational History  . Occupation: Advertising account executive  Tobacco Use  . Smoking status: Former Smoker    Packs/day: 1.00    Years: 30.00    Pack years: 30.00    Quit date: 2006    Years since quitting: 15.9  . Smokeless tobacco: Never Used  . Tobacco comment: quit 2008  Vaping Use  . Vaping Use: Never used  Substance and Sexual Activity  . Alcohol use: Never  . Drug use: Never  . Sexual activity: Not on file  Other Topics Concern  . Not on file  Social History Narrative  . Not on file   Social Determinants of Health   Financial Resource Strain:   . Difficulty of Paying Living Expenses: Not on file  Food Insecurity:   . Worried About Charity fundraiser in the Last  Year: Not on file  . Ran Out of Food in the Last Year: Not on file  Transportation Needs:   . Lack of Transportation (Medical): Not on file  . Lack of Transportation (Non-Medical): Not on file  Physical Activity:   . Days of Exercise per Week: Not  on file  . Minutes of Exercise per Session: Not on file  Stress:   . Feeling of Stress : Not on file  Social Connections:   . Frequency of Communication with Friends and Family: Not on file  . Frequency of Social Gatherings with Friends and Family: Not on file  . Attends Religious Services: Not on file  . Active Member of Clubs or Organizations: Not on file  . Attends Archivist Meetings: Not on file  . Marital Status: Not on file  Intimate Partner Violence:   . Fear of Current or Ex-Partner: Not on file  . Emotionally Abused: Not on file  . Physically Abused: Not on file  . Sexually Abused: Not on file    REVIEW OF SYSTEMS: Constitutional: Some weakness, not profound ENT:  No nose bleeds Pulm: No shortness of breath or cough CV:  No palpitations, no LE edema.  No angina GU:  No hematuria, no frequency GI: HPI Heme: No unusual or excessive bleeding or bruising Transfusions: See HPI Neuro: No syncope, no seizures.  No headaches, no peripheral tingling or numbness Derm:  No itching, no rash or sores.  Endocrine:  No sweats or chills.  No polyuria or dysuria.  TSH in July 21 was 1.16 Immunization: Has been vaccinated for COVID-19 x 3. Travel:  None beyond local counties in last few months.    PHYSICAL EXAM: Vital signs in last 24 hours: Vitals:   08/13/20 0530 08/13/20 0640  BP: 114/63 128/68  Pulse: 75 (!) 101  Resp: 18 18  Temp: 99.1 F (37.3 C) 98 F (36.7 C)  SpO2: 98% 100%   Wt Readings from Last 3 Encounters:  08/13/20 65.2 kg  08/12/20 65.2 kg  08/03/20 68 kg    General: Pleasant, well-appearing, comfortable Head: No facial asymmetry or swelling.  No signs of head trauma. Eyes: No scleral icterus, no  conjunctival pallor.  EOMI Ears: Not hard of hearing Nose: Congestion or discharge. Mouth: Tongue midline.  Excellent dentition.  Mucosa moist, pink, clear. Neck: JVD, thyromegaly, masses. Lungs: Clear bilaterally.  No labored breathing.  No cough Heart: RRR.  No MRG.  S1, S2 present Abdomen: Soft without tenderness.  Active bowel sounds.  No masses, organomegaly, bruits, hernias..   Rectal: Deferred Musc/Skeltl: No joint redness, swelling or significant deformity. Extremities: Feet are warm.  No CCE. Neurologic: Alert.  Oriented x3.  Fluid speech.  Moves all 4 limbs without tremor, strength not tested. Skin: No rash, no sores, no telangiectasia, no significant purpura or bruising. Nodes: No cervical adenopathy Psych: Pleasant, calm, cooperative.  Intake/Output from previous day: 12/03 0701 - 12/04 0700 In: 1000 [IV Piggyback:1000] Out: 0  Intake/Output this shift: No intake/output data recorded.  LAB RESULTS: Recent Labs    08/12/20 1042 08/13/20 0313  WBC 6.7 6.7  HGB 5.4* 5.5*  HCT 20.8* 19.1*  PLT 433 347   BMET Lab Results  Component Value Date   NA 135 08/13/2020   NA 140 06/10/2020   NA 135 04/15/2020   K 3.9 08/13/2020   K 5.1 06/10/2020   K 5.1 04/15/2020   CL 106 08/13/2020   CL 105 06/10/2020   CL 98 04/15/2020   CO2 21 (L) 08/13/2020   CO2 22 06/10/2020   CO2 22 04/15/2020   GLUCOSE 112 (H) 08/13/2020   GLUCOSE 98 06/10/2020   GLUCOSE 110 (H) 04/15/2020   BUN 24 (H) 08/13/2020   BUN 17 06/10/2020   BUN 17 04/15/2020  CREATININE 1.06 08/13/2020   CREATININE 1.06 06/10/2020   CREATININE 1.08 04/15/2020   CALCIUM 8.5 (L) 08/13/2020   CALCIUM 9.1 06/10/2020   CALCIUM 9.6 04/15/2020   LFT Recent Labs    08/13/20 0313  PROT 5.9*  ALBUMIN 3.2*  AST 16  ALT 16  ALKPHOS 58  BILITOT 0.5   PT/INR Lab Results  Component Value Date   INR 1.4 (H) 03/24/2020   INR 1.1 03/19/2020     RADIOLOGY STUDIES: No results found.   IMPRESSION:    *    Microcytic anemia.  Hgb critically low and down ~3 g c/w 4 months ago. EGD and colonoscopy arranged for early next month for evaluation of anemia and previous FOBT positive stool in 03/2020. 2 PRBCs ordered  *     Chronic Plavix.  Remote CAD and stenting.  NSTEMI, 3V CABG 03/2020. Last dose of Plavix 12/2 (however I do not see Plavix on the admission PTA med review       PLAN:     *   EGD, colonoscopy as inpt, if we wait full 5 d plavix hold date for this will be 12/7.   In the meantime ordered him a carbohydrate modified diet.  Added Protonix 40 mg po bid.    *    Complete transfusions and follow-up Hb/hematocrit.  *   ?  Administer a dose of Feraheme?      Azucena Freed  08/13/2020, 9:12 AM Phone [phone]

## 2020-08-13 NOTE — Progress Notes (Signed)
New Admission Note:  Arrival Method: EMS stretcher Mental Orientation: Alert and oriented x 4 per EMS. [name] speaking.  Telemetry: N/A Assessment: Completed Skin: Pale. Warm and dry.  IV: NSL Pain: Denies Tubes: N/A Safety Measures: Safety Fall Prevention Plan initiated.  Admission: Completed 5 M  Orientation: Patient has been orientated to the room, unit and the staff. Welcome booklet given.  Family: None  [facility] paged of admission.   Orders have been reviewed and implemented. Will continue to monitor the patient. Call light has been placed within reach and bed alarm has been activated.   Sima Matas BSN, RN  Phone Number: [phone]

## 2020-08-13 NOTE — ED Provider Notes (Signed)
[facility]ER[facility] DEPARTMENT Provider Note   CSN: [id number] Arrival date & time: 08/13/20  0216     History Chief Complaint  Patient presents with  . Abnormal Lab    Jimmy Blankenship is a 69 y.o. male.  Has had DOE for a few days. Seen by cardiology, found to be anemic. Referred here for same. No chest pain. Some weakness. Some pallor. Has noticed some hematochezia. No syncope.   A language interpreter was used.  Abnormal Lab Time since result:  1 day Patient referred by:  Specialist Resulting agency:  Internal Result type: hematology   Hematology:    Hematocrit:  Low   Hemoglobin:  Low      Past Medical History:  Diagnosis Date  . CKD (chronic kidney disease)   . Coronary Artery Disease s/p CABG    hx of PCI in 2000s in [place] // s/p NSTEMI in 7/21 >> CABG  . Diabetes mellitus 2   . Heart failure with reduced ejection fraction    Ischemic CM // Echo 7/21 - EF 35 // Intraop TEE 7/21: EF 40-45 // Echocardiogram 9/21: apical AK, EF 40-45, Gr 2 DD, trace AI, mild MR, mild LAE, normal RVSF, no pericardial effusion  . High cholesterol   . Hypertension   . Iron deficiency anemia    Heme + stools (during admit for NSTEMI in 7/21)  . Palpitations    Event monitor 10/21: NSR, rare PVCs, rare 4 beats NSVT, rare brief non-sustained SVT    Patient Active Problem List   Diagnosis Date Noted  . Symptomatic anemia 08/13/2020  . DOE (dyspnea on exertion) 08/12/2020  . Post-operative state 06/29/2020  . S/P CABG x 3 03/24/2020  . Chest pain 03/18/2020  . Nonspecific chest pain 03/16/2020  . CAD (coronary artery disease) 03/16/2020  . DM2 (diabetes mellitus, type 2) ([place]) 03/16/2020  . HTN (hypertension) 03/16/2020  . HLD (hyperlipidemia) 03/16/2020  . Normocytic anemia 03/16/2020  . AKI (acute kidney injury) ([place]) 03/16/2020    Past Surgical History:  Procedure Laterality Date  . COLONOSCOPY     around 2013. Normal per patient. [place]  . CORONARY  ANGIOPLASTY WITH STENT PLACEMENT    . CORONARY ARTERY BYPASS GRAFT N/A 03/24/2020   Procedure: CORONARY ARTERY BYPASS GRAFTING (CABG), ON PUMP, TIMES THREE, USING LEFT INTERNAL MAMMARY ARTERY AND RIGHT ENDOSCOPICALLY HARVESTED GREATER SAPHENOUS VEIN;  Surgeon: Ivin Poot, MD;  Location: [facility];  Service: Open Heart Surgery;  Laterality: N/A;  . LEFT HEART CATH AND CORONARY ANGIOGRAPHY N/A 03/17/2020   Procedure: LEFT HEART CATH AND CORONARY ANGIOGRAPHY;  Surgeon: Belva Crome, MD;  Location: [facility];  Service: Cardiovascular;  Laterality: N/A;  . TEE WITHOUT CARDIOVERSION N/A 03/24/2020   Procedure: TRANSESOPHAGEAL ECHOCARDIOGRAM (TEE);  Surgeon: [name], Collier Salina, MD;  Location: [facility];  Service: Open Heart Surgery;  Laterality: N/A;       Family History  Problem Relation Age of Onset  . Atrial fibrillation Mother   . Congestive Heart Failure Mother   . Cancer Neg Hx     Social History   Tobacco Use  . Smoking status: Former Research scientist (life sciences)  . Smokeless tobacco: Never Used  . Tobacco comment: quit 2008  Vaping Use  . Vaping Use: Never used  Substance Use Topics  . Alcohol use: Never  . Drug use: Never    Home Medications Prior to Admission medications   Medication Sig Start Date End Date Taking? Authorizing Provider  atorvastatin (  LIPITOR) 80 MG tablet Take 1 tablet (80 mg total) by mouth daily. 04/15/20   Richardson Dopp T, PA-C  Cyanocobalamin (VITAMIN B-12 PO) Take 1 tablet by mouth daily.    [provider]  gabapentin (NEURONTIN) 300 MG capsule Take 300 mg by mouth daily.    [provider]  metFORMIN (GLUCOPHAGE) 1000 MG tablet Take 1,000 mg by mouth 2 (two) times daily. 05/09/20   [provider]  metoprolol tartrate (LOPRESSOR) 50 MG tablet Take 1 tablet (50 mg total) by mouth 2 (two) times daily. 05/18/20   Richardson Dopp T, PA-C  Multiple Vitamins-Minerals (PRESERVISION AREDS PO) Take 1 tablet by mouth daily.    [provider]     Allergies    Patient has no known allergies.  Review of Systems   Review of Systems  All other systems reviewed and are negative.   Physical Exam Updated Vital Signs BP 114/63 (BP Location: Left Arm)   Pulse 75   Temp 99.1 F (37.3 C) (Oral)   Resp 18   Ht 5\' 9"  (1.753 m)   Wt 65.2 kg   SpO2 98%   BMI 21.23 kg/m   Physical Exam Vitals and nursing note reviewed.  Constitutional:      Appearance: He is well-developed.  HENT:     Head: Normocephalic and atraumatic.     Nose: No congestion or rhinorrhea.     Mouth/Throat:     Mouth: Mucous membranes are moist.     Pharynx: Oropharynx is clear.  Eyes:     Pupils: Pupils are equal, round, and reactive to light.     Comments: Conjunctival pallor  Cardiovascular:     Rate and Rhythm: Normal rate.  Pulmonary:     Effort: Pulmonary effort is normal. No respiratory distress.  Abdominal:     General: Abdomen is flat. There is no distension.  Musculoskeletal:        General: Normal range of motion.     Cervical back: Normal range of motion.  Skin:    General: Skin is warm and dry.  Neurological:     General: No focal deficit present.     Mental Status: He is alert.     ED Results / Procedures / Treatments   Labs (all labs ordered are listed, but only abnormal results are displayed) Labs Reviewed  CBC WITH DIFFERENTIAL/PLATELET - Abnormal; Notable for the following components:      Result Value   RBC 2.95 (*)    Hemoglobin 5.5 (*)    HCT 19.1 (*)    MCV 64.7 (*)    MCH 18.6 (*)    MCHC 28.8 (*)    RDW 21.3 (*)    nRBC 0.4 (*)    All other components within normal limits  COMPREHENSIVE METABOLIC PANEL - Abnormal; Notable for the following components:   CO2 21 (*)    Glucose, Bld 112 (*)    BUN 24 (*)    Calcium 8.5 (*)    Total Protein 5.9 (*)    Albumin 3.2 (*)    All other components within normal limits  RESP PANEL BY RT-PCR (FLU A&B, COVID) ARPGX2  OCCULT BLOOD X 1 CARD TO LAB, STOOL  VITAMIN B12   FOLATE  IRON AND TIBC  FERRITIN  RETICULOCYTES  TYPE AND SCREEN  ABO/RH  ANTIBODY SCREEN  PREPARE RBC (CROSSMATCH)    EKG None  Radiology No results found.  Procedures Procedures (including critical care time)  Medications Ordered in  ED Medications  0.9 %  sodium chloride infusion (Manually program via Guardrails IV Fluids) (has no administration in time range)  lactated ringers bolus 1,000 mL ( Intravenous Stopped 08/13/20 0426)    ED Course  I have reviewed the triage vital signs and the nursing notes.  Pertinent labs & imaging results that were available during my care of the patient were reviewed by me and considered in my medical decision making (see chart for details).    MDM Rules/Calculators/A&P                          Stable negative Hemoccult.  Suspect is probably GI as he has had past Hemoccults the past has noticed hematochezia.  Either way added on a anemia panel.  Verified his hemoglobin is low.  Discussed with hospitalist who will admit.  No blood products given here as well the very limited supplies for life-threatening trauma or hemorrhage only.  Will initiate at hospital when admitted.  Final Clinical Impression(s) / ED Diagnoses Final diagnoses:  Symptomatic anemia    Rx / DC Orders ED Discharge Orders    None       Cannon Quinton, Corene Cornea, MD 08/13/20 [phone]

## 2020-08-13 NOTE — Plan of Care (Signed)
°  Problem: Coping: °Goal: Level of anxiety will decrease °Outcome: Progressing °  °

## 2020-08-13 NOTE — ED Notes (Signed)
ED Provider at bedside. 

## 2020-08-13 NOTE — ED Triage Notes (Signed)
Patient was sent here this am for eval due to recent labs hgb 5.4 on 12/3; Patient states dark stools x 4-5 days.

## 2020-08-13 NOTE — ED Notes (Signed)
Date and time results received: 08/13/20 0329 (use smartphrase ".now" to insert current time)  Test: Hgb Critical Value: 5.5  Name of Provider Notified: Dr Dayna Barker  Orders Received? Or Actions Taken?: none

## 2020-08-13 NOTE — Progress Notes (Signed)
Report received from Med center.

## 2020-08-13 NOTE — H&P (Signed)
History and Physical    Jimmy Blankenship QAS:[id number] DOB: October 18, 1950 DOA: 08/13/2020  PCP: [name], MD Consultants:  Nahser - cardiology; Lyndel Safe - GI; [name] - CT surgery Patient coming from:  Home - lives with wife; [id number] and daughter, [phone]   Chief Complaint: DOE, dark stools  HPI: Jimmy Blankenship is a 69 y.o. male with medical history significant of iron deficiency anemia with heme + stools during admission for NSTEMI in 03/2020; HTN; HLD; chronic systolic CHF; DM; and CAD s/p CABG (03/2020) presenting with DOE, dark stools. Since July, he has been getting better from a cardiac standpoint.  Recently, he has noticed black stools again.  He was told to stop taking Plavix and stools have improved since; his last dose was Thursday AM.  +lightheaded/dizzy with ambulation.  +SOB with ambulation.  He has been pale and dizzy.  He feels his heart pumping fast.  He does not take anything at all OTC. No h/o GI bleeding prior to July.      ED Course:  [facility] transfer, per Dr. Clearence Ped:  69 year old gentleman went to his cardiologist for dyspnea on exertion. Lab work was done that found a hgb of 5.4, and patient was sent to ED. He presented to [facility] where they don't give blood. They confirmed hgb 5.5. Family reports that they have seen blood in his stool. Heme occult negative in ER - but may need to be repeated with a better sample per ERP. Likely gastro will need to be consulted. Anemia panel pending.   Review of Systems: As per HPI; otherwise review of systems reviewed and negative.   Ambulatory Status:  Ambulates without assistance  COVID Vaccine Status:  Complete plus booster  Past Medical History:  Diagnosis Date  . CKD (chronic kidney disease)   . Coronary Artery Disease s/p CABG    hx of PCI in 2000s in [place] // s/p NSTEMI in 7/21 >> CABG  . Diabetes mellitus 2   . Heart failure with reduced ejection fraction    Ischemic CM // Echo 7/21 - EF 35 // Intraop TEE  7/21: EF 40-45 // Echocardiogram 9/21: apical AK, EF 40-45, Gr 2 DD, trace AI, mild MR, mild LAE, normal RVSF, no pericardial effusion  . High cholesterol   . Hypertension   . Iron deficiency anemia    Heme + stools (during admit for NSTEMI in 7/21)  . Palpitations    Event monitor 10/21: NSR, rare PVCs, rare 4 beats NSVT, rare brief non-sustained SVT    Past Surgical History:  Procedure Laterality Date  . COLONOSCOPY     around 2013. Normal per patient. [place]  . CORONARY ANGIOPLASTY WITH STENT PLACEMENT    . CORONARY ARTERY BYPASS GRAFT N/A 03/24/2020   Procedure: CORONARY ARTERY BYPASS GRAFTING (CABG), ON PUMP, TIMES THREE, USING LEFT INTERNAL MAMMARY ARTERY AND RIGHT ENDOSCOPICALLY HARVESTED GREATER SAPHENOUS VEIN;  Surgeon: Ivin Poot, MD;  Location: [facility];  Service: Open Heart Surgery;  Laterality: N/A;  . LEFT HEART CATH AND CORONARY ANGIOGRAPHY N/A 03/17/2020   Procedure: LEFT HEART CATH AND CORONARY ANGIOGRAPHY;  Surgeon: Belva Crome, MD;  Location: [facility];  Service: Cardiovascular;  Laterality: N/A;  . TEE WITHOUT CARDIOVERSION N/A 03/24/2020   Procedure: TRANSESOPHAGEAL ECHOCARDIOGRAM (TEE);  Surgeon: [name], Collier Salina, MD;  Location: [facility];  Service: Open Heart Surgery;  Laterality: N/A;    Social History   Socioeconomic History  . Marital status:  Married    Spouse name: Not on file  . Number of children: Not on file  . Years of education: Not on file  . Highest education level: Not on file  Occupational History  . Occupation: Advertising account executive  Tobacco Use  . Smoking status: Former Smoker    Packs/day: 1.00    Years: 30.00    Pack years: 30.00    Quit date: 2006    Years since quitting: 15.9  . Smokeless tobacco: Never Used  . Tobacco comment: quit 2008  Vaping Use  . Vaping Use: Never used  Substance and Sexual Activity  . Alcohol use: Never  . Drug use: Never  . Sexual activity: Not on file  Other Topics Concern  . Not on file  Social  History Narrative  . Not on file   Social Determinants of Health   Financial Resource Strain:   . Difficulty of Paying Living Expenses: Not on file  Food Insecurity:   . Worried About Charity fundraiser in the Last Year: Not on file  . Ran Out of Food in the Last Year: Not on file  Transportation Needs:   . Lack of Transportation (Medical): Not on file  . Lack of Transportation (Non-Medical): Not on file  Physical Activity:   . Days of Exercise per Week: Not on file  . Minutes of Exercise per Session: Not on file  Stress:   . Feeling of Stress : Not on file  Social Connections:   . Frequency of Communication with Friends and Family: Not on file  . Frequency of Social Gatherings with Friends and Family: Not on file  . Attends Religious Services: Not on file  . Active Member of Clubs or Organizations: Not on file  . Attends Archivist Meetings: Not on file  . Marital Status: Not on file  Intimate Partner Violence:   . Fear of Current or Ex-Partner: Not on file  . Emotionally Abused: Not on file  . Physically Abused: Not on file  . Sexually Abused: Not on file    No Known Allergies  Family History  Problem Relation Age of Onset  . Atrial fibrillation Mother   . Congestive Heart Failure Mother   . Cancer Neg Hx     Prior to Admission medications   Medication Sig Start Date End Date Taking? Authorizing Provider  atorvastatin (LIPITOR) 80 MG tablet Take 1 tablet (80 mg total) by mouth daily. 04/15/20   Richardson Dopp T, PA-C  Cyanocobalamin (VITAMIN B-12 PO) Take 1 tablet by mouth daily.    [provider]  gabapentin (NEURONTIN) 300 MG capsule Take 300 mg by mouth daily.    [provider]  metFORMIN (GLUCOPHAGE) 1000 MG tablet Take 1,000 mg by mouth 2 (two) times daily. 05/09/20   [provider]  metoprolol tartrate (LOPRESSOR) 50 MG tablet Take 1 tablet (50 mg total) by mouth 2 (two) times daily. 05/18/20   Richardson Dopp T, PA-C  Multiple  Vitamins-Minerals (PRESERVISION AREDS PO) Take 1 tablet by mouth daily.    [provider]    Physical Exam: Vitals:   08/13/20 0500 08/13/20 0522 08/13/20 0530 08/13/20 0640  BP: 122/65 122/65 114/63 128/68  Pulse: 79 75 75 (!) 101  Resp: 16 17 18 18   Temp:   99.1 F (37.3 C) 98 F (36.7 C)  TempSrc:   Oral Oral  SpO2: 99% 98% 98% 100%  Weight:      Height:         .  General:  Appears calm and comfortable and is NAD . Eyes:  PERRL, EOMI, normal lids, iris . ENT:  grossly normal hearing, lips & tongue, mmm; appropriate dentition . Neck:  no LAD, masses or thyromegaly . Cardiovascular:  RRR, no m/r/g. No LE edema.  Marland Kitchen Respiratory:   CTA bilaterally with no wheezes/rales/rhonchi.  Normal respiratory effort. . Abdomen:  soft, NT, ND, NABS . Skin:  no rash or induration seen on limited exam . Musculoskeletal:  grossly normal tone BUE/BLE, good ROM, no bony abnormality . Psychiatric:  grossly normal mood and affect, speech fluent and appropriate, AOx3 . Neurologic:  CN 2-12 grossly intact, moves all extremities in coordinated fashion    Radiological Exams on Admission: Independently reviewed - see discussion in A/P where applicable  No results found.  EKG: not done   Labs on Admission: I have personally reviewed the available labs and imaging studies at the time of the admission.  Pertinent labs:   Glucose 112 BUN 24/Creatinine 1.06/GFR >60 Albumin 3.2 WBC 6.7 Hgb 5.5, 5.4 yesterday; 10.2 on 8/6 Heme negative COVID/flu negative   Assessment/Plan Principal Problem:   Symptomatic anemia Active Problems:   CAD (coronary artery disease)   DM2 (diabetes mellitus, type 2) (HCC)   HTN (hypertension)   HLD (hyperlipidemia)   Upper GI bleeding    ABLA -Patient's lightheadedness and DOE are most likely caused by anemia secondary to upper GI bleeding.  -His Hgb decreased from 10.2 in August to 5.5 today.  -Type and screen were done in ED.  -Two units of  blood were ordered by ED.  -Monitor closely and follow cbc q12h, transfuse as necessary for Hbg <8 due to h/o cardiac disease  UGI bleeding -Patient' is presenting with melena, suggestive of upper GI bleeding; he stopped his Plavix (last dose Thursday) and his stools have cleared so he is now heme negative. -He also had heme positive stools in July but his cardiac disease was more pressing at that time and so evaluation was deferred; he was seen by Dr. Lyndel Safe on 11/24 with plan for upcoming EGD/colonoscopy -Most likely diagnosis is gastric or duodenal ulcer, esophagitis or gastritis -The patient is not tachycardic with normal blood pressure, suggesting subacute volume loss.  -Will admit to med surg for now -GI consulted by ED, will follow up recommendations -NPO for possible EGD +/- colonoscopy -His last dose of Plavix was 12/2 and so completion of the 5-day washout would not occur until 12/7 -NS at 100 mL/hr -Since patient appears to be relatively stable at this time, will start oral 40 mg PO BID PPI -Zofran IV for nausea -Avoid NSAIDs and SQ heparin -Maintain IV access (2 large bore IVs if possible). -Hold ASA, Plavix for now (stopped by cardiology yesterday and so not currently showing up on medication list)  CAD s/p CABG -He has been doing cardiac rehab and has recovered well until his recent onset of DOE -ASA and Plavix are on hold given concern for GI bleeding -Continue BB, statin  HTN -Lisinopril was held by cardilogy yesterday -Continue Lopressor  HLD -Continue Lipitor  DM -A1c was 6.4 in 03/2020, indicating good control -hold Glucophage -Cover with moderate-scale SSI -Continue Neurontin    Note: This patient has been tested and is negative for the novel coronavirus COVID-19. She has been fully vaccinated against COVID-19.     DVT prophylaxis: SCDs Code Status:  DNR - confirmed with patient/family Family Communication: Son-in-law was present throughout evaluation   Disposition Plan:  The patient  is from: home  Anticipated d/c is to: home without [facility] services   Anticipated d/c date will depend on clinical response to treatment, but likely 2-3 days since he appears to need GI evaluation and/or intervention  Patient is currently: acutely ill Consults called: GI Admission status: Admit - It is my clinical opinion that admission to INPATIENT is reasonable and necessary because of the expectation that this patient will require hospital care that crosses at least 2 midnights to treat this condition based on the medical complexity of the problems presented.  Given the aforementioned information, the predictability of an adverse outcome is felt to be significant.    Karmen Bongo MD [facility]   How to contact the [facility] Attending or Consulting provider [place] or covering provider during after hours [place], for this patient?  1. Check the care team in [facility] and look for a) attending/consulting [facility] provider listed and b) the [facility] team listed 2. Log into [email or web address] and use [place]'s universal password to access. If you do not have the password, please contact the hospital operator. 3. Locate the [facility] provider you are looking for under [facility] and page to a number that you can be directly reached. 4. If you still have difficulty reaching the provider, please page the [facility] (Director on Call) for the Hospitalists listed on amion for assistance.   08/13/2020, 10:03 AM

## 2020-08-14 DIAGNOSIS — D509 Iron deficiency anemia, unspecified: Secondary | ICD-10-CM | POA: Diagnosis not present

## 2020-08-14 DIAGNOSIS — D649 Anemia, unspecified: Secondary | ICD-10-CM | POA: Diagnosis not present

## 2020-08-14 LAB — CBC
HCT: 23.7 % — ABNORMAL LOW (ref 39.0–52.0)
HCT: 24.1 % — ABNORMAL LOW (ref 39.0–52.0)
HCT: 30.1 % — ABNORMAL LOW (ref 39.0–52.0)
Hemoglobin: 7.3 g/dL — ABNORMAL LOW (ref 13.0–17.0)
Hemoglobin: 7.6 g/dL — ABNORMAL LOW (ref 13.0–17.0)
Hemoglobin: 9.4 g/dL — ABNORMAL LOW (ref 13.0–17.0)
MCH: 21.6 pg — ABNORMAL LOW (ref 26.0–34.0)
MCH: 22 pg — ABNORMAL LOW (ref 26.0–34.0)
MCH: 22.9 pg — ABNORMAL LOW (ref 26.0–34.0)
MCHC: 30.8 g/dL (ref 30.0–36.0)
MCHC: 31.5 g/dL (ref 30.0–36.0)
MCHC: 31.2 g/dL (ref 30.0–36.0)
MCV: 70.1 fL — ABNORMAL LOW (ref 80.0–100.0)
MCV: 69.9 fL — ABNORMAL LOW (ref 80.0–100.0)
MCV: 73.2 fL — ABNORMAL LOW (ref 80.0–100.0)
Platelets: 298 10*3/uL (ref 150–400)
Platelets: 304 10*3/uL (ref 150–400)
Platelets: 331 10*3/uL (ref 150–400)
RBC: 3.38 MIL/uL — ABNORMAL LOW (ref 4.22–5.81)
RBC: 3.45 MIL/uL — ABNORMAL LOW (ref 4.22–5.81)
RBC: 4.11 MIL/uL — ABNORMAL LOW (ref 4.22–5.81)
RDW: 22.2 % — ABNORMAL HIGH (ref 11.5–15.5)
RDW: 22.4 % — ABNORMAL HIGH (ref 11.5–15.5)
RDW: 23.9 % — ABNORMAL HIGH (ref 11.5–15.5)
WBC: 5.8 10*3/uL (ref 4.0–10.5)
WBC: 6.2 10*3/uL (ref 4.0–10.5)
WBC: 6.5 10*3/uL (ref 4.0–10.5)
nRBC: 0.5 % — ABNORMAL HIGH (ref 0.0–0.2)
nRBC: 0.5 % — ABNORMAL HIGH (ref 0.0–0.2)
nRBC: 0.8 % — ABNORMAL HIGH (ref 0.0–0.2)

## 2020-08-14 LAB — BASIC METABOLIC PANEL
Anion gap: 7 (ref 5–15)
BUN: 12 mg/dL (ref 8–23)
CO2: 24 mmol/L (ref 22–32)
Calcium: 8.6 mg/dL — ABNORMAL LOW (ref 8.9–10.3)
Chloride: 107 mmol/L (ref 98–111)
Creatinine, Ser: 1.07 mg/dL (ref 0.61–1.24)
GFR, Estimated: >60 mL/min (ref >60)
Glucose, Bld: 88 mg/dL (ref 70–99)
Potassium: 4.2 mmol/L (ref 3.5–5.1)
Sodium: 138 mmol/L (ref 135–145)

## 2020-08-14 LAB — GLUCOSE, CAPILLARY
Glucose-Capillary: 78 mg/dL (ref 70–99)
Glucose-Capillary: 106 mg/dL — ABNORMAL HIGH (ref 70–99)
Glucose-Capillary: 110 mg/dL — ABNORMAL HIGH (ref 70–99)
Glucose-Capillary: 85 mg/dL (ref 70–99)

## 2020-08-14 LAB — PREPARE RBC (CROSSMATCH)

## 2020-08-14 MED ORDER — PEG-KCL-NACL-NASULF-NA ASC-C 100 G PO SOLR
0.5000 | Freq: Once | ORAL | Status: AC
  Administered 2020-08-14: 100 g via ORAL
  Filled 2020-08-14: qty 1

## 2020-08-14 MED ORDER — BISACODYL 5 MG PO TBEC
20.0000 mg | DELAYED_RELEASE_TABLET | Freq: Once | ORAL | Status: AC
  Administered 2020-08-14: 20 mg via ORAL
  Filled 2020-08-14: qty 4

## 2020-08-14 MED ORDER — PEG-KCL-NACL-NASULF-NA ASC-C 100 G PO SOLR: 1.0000 | Freq: Once | ORAL | Status: DC

## 2020-08-14 MED ORDER — METOCLOPRAMIDE HCL 5 MG/ML IJ SOLN
10.0000 mg | Freq: Once | INTRAMUSCULAR | Status: AC
  Administered 2020-08-14: 10 mg via INTRAVENOUS
  Filled 2020-08-14: qty 2

## 2020-08-14 MED ORDER — SODIUM CHLORIDE 0.9% IV SOLUTION: Freq: Once | INTRAVENOUS | Status: AC

## 2020-08-14 NOTE — Progress Notes (Signed)
RN messaged MD on call to ask about order to obtain consent for EGD/Colonoscopy for tomorrow am at 0800. This RN can see the provider attestation, just not the order to obtain consent. RN awaiting response.   Eleanora Neighbor, RN

## 2020-08-14 NOTE — Progress Notes (Signed)
RN used [name] interpreter with pt and pt stated that his BMs are 95 percent clear. Pt denies any pain and is aware that he is not supposed to eat or drink anything after 0300.   Eleanora Neighbor, RN

## 2020-08-14 NOTE — Progress Notes (Signed)
          Daily Rounding Note  08/14/2020, 1:53 PM  LOS: 1 day   SUBJECTIVE:   Chief complaint:   Acute on chronic anemia requiring blood transfusion. No problems, no complaints, no shortness of breath.  OBJECTIVE:         Vital signs in last 24 hours:    Temp:  [97.7 F (36.5 C)-98.9 F (37.2 C)] 98.7 F (37.1 C) (12/05 1245) Pulse Rate:  [64-77] 68 (12/05 1248) Resp:  [16-18] 18 (12/05 1245) BP: (93-122)/(57-73) 112/66 (12/05 1248) SpO2:  [98 %-100 %] 100 % (12/05 1248) Weight:  [68.3 kg] 68.3 kg (12/04 2118) Last BM Date: 08/13/20 Filed Weights   08/13/20 0225 08/13/20 2118  Weight: 65.2 kg 68.3 kg   General: Looks well, comfortable, alert Heart: RRR. Chest: Clear bilaterally.  No labored breathing, no cough Abdomen: Soft, not tender or distended.  Active bowel sounds. Extremities: No CCE. Neuro/Psych: Alert and oriented x3.  Appropriate.  Moves all 4 limbs without tremor and grossly normal strength.  Intake/Output from previous day: 12/04 0701 - 12/05 0700 In: 1956.3 [P.O.:700; I.V.:469.6; Blood:670; IV Piggyback:116.8] Out: 2650 [Urine:2650]  Intake/Output this shift: Total I/O In: 1812.7 [P.O.:240; I.V.:1242.7; Blood:330] Out: 450 [Urine:450]  Lab Results: Recent Labs    08/13/20 0313 08/14/20 0125 08/14/20 0508  WBC 6.7 5.8 6.2  HGB 5.5* 7.3* 7.6*  HCT 19.1* 23.7* 24.1*  PLT 347 298 304   BMET Recent Labs    08/13/20 0313 08/14/20 0508  NA 135 138  K 3.9 4.2  CL 106 107  CO2 21* 24  GLUCOSE 112* 88  BUN 24* 12  CREATININE 1.06 1.07  CALCIUM 8.5* 8.6*   LFT Recent Labs    08/13/20 0313  PROT 5.9*  ALBUMIN 3.2*  AST 16  ALT 16  ALKPHOS 58  BILITOT 0.5   PT/INR No results for input(s): LABPROT, INR in the last 72 hours. Hepatitis Panel No results for input(s): HEPBSAG, HCVAB, HEPAIGM, HEPBIGM in the last 72 hours.  Studies/Results: No results found.  ASSESMENT:   *     Microcytic anemia.  Hgb critically low and down ~3 g c/w 4 months ago. EGD and colonoscopy arranged for early next month for evaluation of anemia and previous FOBT positive stool in 03/2020. Hgb 7.3 >> 2 PRBCs >> 7.6. >> 1 PRBC >> cbc due 5 PM.    feraheme on 12/4  *     Chronic Plavix.  Remote CAD and stenting.  NSTEMI, 3V CABG 03/2020. Last dose of Plavix 12/2 (however I do not see Plavix on the admission PTA med review)    PLAN   *   EGD and colonoscopy at 8 AM tmrw.  Begin moviprep this PM.  Start clears now (was on solids thru now) Dulcolax and moviprep, reglan bowel prep.        Azucena Freed  08/14/2020, 1:53 PM Phone [phone]

## 2020-08-14 NOTE — H&P (View-Only) (Signed)
          Daily Rounding Note  08/14/2020, 1:53 PM  LOS: 1 day   SUBJECTIVE:   Chief complaint:   Acute on chronic anemia requiring blood transfusion. No problems, no complaints, no shortness of breath.  OBJECTIVE:         Vital signs in last 24 hours:    Temp:  [97.7 F (36.5 C)-98.9 F (37.2 C)] 98.7 F (37.1 C) (12/05 1245) Pulse Rate:  [64-77] 68 (12/05 1248) Resp:  [16-18] 18 (12/05 1245) BP: (93-122)/(57-73) 112/66 (12/05 1248) SpO2:  [98 %-100 %] 100 % (12/05 1248) Weight:  [68.3 kg] 68.3 kg (12/04 2118) Last BM Date: 08/13/20 Filed Weights   08/13/20 0225 08/13/20 2118  Weight: 65.2 kg 68.3 kg   General: Looks well, comfortable, alert Heart: RRR. Chest: Clear bilaterally.  No labored breathing, no cough Abdomen: Soft, not tender or distended.  Active bowel sounds. Extremities: No CCE. Neuro/Psych: Alert and oriented x3.  Appropriate.  Moves all 4 limbs without tremor and grossly normal strength.  Intake/Output from previous day: 12/04 0701 - 12/05 0700 In: 1956.3 [P.O.:700; I.V.:469.6; Blood:670; IV Piggyback:116.8] Out: 2650 [Urine:2650]  Intake/Output this shift: Total I/O In: 1812.7 [P.O.:240; I.V.:1242.7; Blood:330] Out: 450 [Urine:450]  Lab Results: Recent Labs    08/13/20 0313 08/14/20 0125 08/14/20 0508  WBC 6.7 5.8 6.2  HGB 5.5* 7.3* 7.6*  HCT 19.1* 23.7* 24.1*  PLT 347 298 304   BMET Recent Labs    08/13/20 0313 08/14/20 0508  NA 135 138  K 3.9 4.2  CL 106 107  CO2 21* 24  GLUCOSE 112* 88  BUN 24* 12  CREATININE 1.06 1.07  CALCIUM 8.5* 8.6*   LFT Recent Labs    08/13/20 0313  PROT 5.9*  ALBUMIN 3.2*  AST 16  ALT 16  ALKPHOS 58  BILITOT 0.5   PT/INR No results for input(s): LABPROT, INR in the last 72 hours. Hepatitis Panel No results for input(s): HEPBSAG, HCVAB, HEPAIGM, HEPBIGM in the last 72 hours.  Studies/Results: No results found.  ASSESMENT:   *     Microcytic anemia.  Hgb critically low and down ~3 g c/w 4 months ago. EGD and colonoscopy arranged for early next month for evaluation of anemia and previous FOBT positive stool in 03/2020. Hgb 7.3 >> 2 PRBCs >> 7.6. >> 1 PRBC >> cbc due 5 PM.    feraheme on 12/4  *     Chronic Plavix.  Remote CAD and stenting.  NSTEMI, 3V CABG 03/2020. Last dose of Plavix 12/2 (however I do not see Plavix on the admission PTA med review)    PLAN   *   EGD and colonoscopy at 8 AM tmrw.  Begin moviprep this PM.  Start clears now (was on solids thru now) Dulcolax and moviprep, reglan bowel prep.        Jimmy Blankenship  08/14/2020, 1:53 PM Phone [phone]

## 2020-08-14 NOTE — Progress Notes (Signed)
PROGRESS NOTE    Jimmy Blankenship  HWE:[id number] DOB: 11/24/1950 DOA: 08/13/2020 PCP: [name], MD   Brief Narrative: 69 year old with past medical history significant for iron deficiency anemia, heme positive stool during an admission for non-STEMI in July 2021, hypertension, hyperlipidemia, chronic systolic heart failure, CAD status post CABG July/2021 presenting with shortness of breath, dark stool.  Since July, he has been getting better from a cardiac standpoint.  Recently he noticed black stool again.  He was advised to stop taking Plavix and his stool have improved since. Patient went to see his cardiology for shortness of breath on exertion, lab work does show a hemoglobin of 5.4 and patient was sent to the ED.  His hemoglobin was found to be 5.5.  GI has been consulted, plan is for upper endoscopy and colonoscopy on Monday.    Assessment & Plan:   Principal Problem:   Symptomatic anemia Active Problems:   CAD (coronary artery disease)   DM2 (diabetes mellitus, type 2) (HCC)   HTN (hypertension)   HLD (hyperlipidemia)   Upper GI bleeding  1-Acute blood loss anemia, upper GI bleed: -Patient presented with shortness of breath, melena and a hemoglobin of 5.5. -Last hemoglobin in August was at 10.2. -Patient received 2 units of packed red blood cell. -Plan for endoscopy colonoscopy on Monday. -Continue to hold Plavix. Last dose of plavix was 12-02. Continue to hold aspirin and plavix which were stop by his cardiologist 12/04. -Patient received IV Feraheme on 12/4. -Hemoglobin  this morning 7.6, goal is for hemoglobin more than 8 due to history of cardiac disease.  Plan to proceed with 1 unit of packed red blood cell today  2-CAD s/p CABG;  Continue beta-blockers.  Holding aspirin and Plavix due to acute GI bleed. Resume aspirin and Plavix when okay by GI.  Work-up in process  HTN;  Continue with BB.   HLD;  Continue with Lipitor.   DM type 2;  Last HbA1c 6.4  03/2020 Continue to hold glucophage.  Continue with SSI.     -     Estimated body mass index is 22.24 kg/m as calculated from the following:   Height as of this encounter: 5\' 9"  (1.753 m).   Weight as of this encounter: 68.3 kg.   DVT prophylaxis: SCDs Code Status: DNR Family Communication: Care discussed with patient Disposition Plan:  Status is: Inpatient  Remains inpatient appropriate because:IV treatments appropriate due to intensity of illness or inability to take PO   Dispo: The patient is from: Home              Anticipated d/c is to: Home              Anticipated d/c date is: 2 days              Patient currently is not medically stable to d/c.        Consultants:   GI  Procedures:   Endoscopy colonoscopy planning on 12/6  Antimicrobials:    Subjective: Patient interviewed with the assistance of iPad translator.  He denies abdominal pain.  He is feeling well.  He denies chest pain or shortness of breath.  He has not been ambulating so he does not know how he will feel on ambulation. Last bowel movement was yesterday after lunch, brown color stool.   Objective: Vitals:   08/13/20 1815 08/13/20 1830 08/13/20 2118 08/14/20 0603  BP: 98/65 106/64 115/60 121/66  Pulse: 72 75 77  70  Resp: 18 18 18 16   Temp: 98.7 F (37.1 C) 98.8 F (37.1 C) 98 F (36.7 C) 97.7 F (36.5 C)  TempSrc: Oral Oral Oral Oral  SpO2: 100% 100% 100% 98%  Weight:   68.3 kg   Height:        Intake/Output Summary (Last 24 hours) at 08/14/2020 0717 Last data filed at 08/14/2020 8413 Gross per 24 hour  Intake 1956.34 ml  Output 2650 ml  Net -693.66 ml   Filed Weights   08/13/20 0225 08/13/20 2118  Weight: 65.2 kg 68.3 kg    Examination:  General exam: Appears calm and comfortable  Respiratory system: Clear to auscultation. Respiratory effort normal. Cardiovascular system: S1 & S2 heard, RRR. No JVD, murmurs, rubs, gallops or clicks. No pedal  edema. Gastrointestinal system: Abdomen is nondistended, soft and nontender. No organomegaly or masses felt. Normal bowel sounds heard. Central nervous system: Alert and oriented. No focal neurological deficits. Extremities: Symmetric 5 x 5 power.   Data Reviewed: I have personally reviewed following labs and imaging studies  CBC: Recent Labs  Lab 08/12/20 1042 08/13/20 0313 08/14/20 0125 08/14/20 0508  WBC 6.7 6.7 5.8 6.2  NEUTROABS  --  3.8  --   --   HGB 5.4* 5.5* 7.3* 7.6*  HCT 20.8* 19.1* 23.7* 24.1*  MCV 68* 64.7* 70.1* 69.9*  PLT 433 347 298 244   Basic Metabolic Panel: Recent Labs  Lab 08/13/20 0313 08/14/20 0508  NA 135 138  K 3.9 4.2  CL 106 107  CO2 21* 24  GLUCOSE 112* 88  BUN 24* 12  CREATININE 1.06 1.07  CALCIUM 8.5* 8.6*   GFR: Estimated Creatinine Clearance: 64.7 mL/min (by C-G formula based on SCr of 1.07 mg/dL). Liver Function Tests: Recent Labs  Lab 08/13/20 0313  AST 16  ALT 16  ALKPHOS 58  BILITOT 0.5  PROT 5.9*  ALBUMIN 3.2*   No results for input(s): LIPASE, AMYLASE in the last 168 hours. No results for input(s): AMMONIA in the last 168 hours. Coagulation Profile: No results for input(s): INR, PROTIME in the last 168 hours. Cardiac Enzymes: No results for input(s): CKTOTAL, CKMB, CKMBINDEX, TROPONINI in the last 168 hours. BNP (last 3 results) No results for input(s): PROBNP in the last 8760 hours. HbA1C: No results for input(s): HGBA1C in the last 72 hours. CBG: Recent Labs  Lab 08/13/20 0903 08/13/20 1131 08/13/20 1619  GLUCAP 69* 84 177*   Lipid Profile: No results for input(s): CHOL, HDL, LDLCALC, TRIG, CHOLHDL, LDLDIRECT in the last 72 hours. Thyroid Function Tests: No results for input(s): TSH, T4TOTAL, FREET4, T3FREE, THYROIDAB in the last 72 hours. Anemia Panel: Recent Labs    08/13/20 0427  VITAMINB12 2,781*  FOLATE 7.7  FERRITIN 5*  TIBC 375  IRON 16*  RETICCTPCT 2.2   Sepsis Labs: No results for  input(s): PROCALCITON, LATICACIDVEN in the last 168 hours.  Recent Results (from the past 240 hour(s))  Resp Panel by RT-PCR (Flu A&B, Covid) Nasopharyngeal Swab     Status: None   Collection Time: 08/13/20  3:27 AM   Specimen: Nasopharyngeal Swab; Nasopharyngeal(NP) swabs in vial transport medium  Result Value Ref Range Status   SARS Coronavirus 2 by RT PCR NEGATIVE NEGATIVE Final    Comment: (NOTE) SARS-CoV-2 target nucleic acids are NOT DETECTED.  The SARS-CoV-2 RNA is generally detectable in upper respiratory specimens during the acute phase of infection. The lowest concentration of SARS-CoV-2 viral copies this assay can detect is  138 copies/mL. A negative result does not preclude SARS-Cov-2 infection and should not be used as the sole basis for treatment or other patient management decisions. A negative result may occur with  improper specimen collection/handling, submission of specimen other than nasopharyngeal swab, presence of viral mutation(s) within the areas targeted by this assay, and inadequate number of viral copies(<138 copies/mL). A negative result must be combined with clinical observations, patient history, and epidemiological information. The expected result is Negative.  Fact Sheet for Patients:  EntrepreneurPulse.com.au  Fact Sheet for Healthcare Providers:  IncredibleEmployment.be  This test is no t yet approved or cleared by the [place] FDA and  has been authorized for detection and/or diagnosis of SARS-CoV-2 by FDA under an Emergency Use Authorization (EUA). This EUA will remain  in effect (meaning this test can be used) for the duration of the COVID-19 declaration under Section 564(b)(1) of the Act, 21 U.S.C.section 360bbb-3(b)(1), unless the authorization is terminated  or revoked sooner.       Influenza A by PCR NEGATIVE NEGATIVE Final   Influenza B by PCR NEGATIVE NEGATIVE Final    Comment: (NOTE) The  Xpert Xpress SARS-CoV-2/FLU/RSV plus assay is intended as an aid in the diagnosis of influenza from Nasopharyngeal swab specimens and should not be used as a sole basis for treatment. Nasal washings and aspirates are unacceptable for Xpert Xpress SARS-CoV-2/FLU/RSV testing.  Fact Sheet for Patients: EntrepreneurPulse.com.au  Fact Sheet for Healthcare Providers: IncredibleEmployment.be  This test is not yet approved or cleared by the [place] FDA and has been authorized for detection and/or diagnosis of SARS-CoV-2 by FDA under an Emergency Use Authorization (EUA). This EUA will remain in effect (meaning this test can be used) for the duration of the COVID-19 declaration under Section 564(b)(1) of the Act, 21 U.S.C. section 360bbb-3(b)(1), unless the authorization is terminated or revoked.  Performed at [facility], [address] [place], [place] [address]          Radiology Studies: No results found.      Scheduled Meds: . atorvastatin  80 mg Oral Daily  . gabapentin  300 mg Oral Daily  . insulin aspart  0-15 Units Subcutaneous TID WC  . metoprolol tartrate  50 mg Oral BID  . pantoprazole  40 mg Oral BID   Continuous Infusions: . ferumoxytol Stopped (08/13/20 1326)  . lactated ringers Stopped (08/13/20 1516)     LOS: 1 day    Time spent: 35 minutes    Jazline Cumbee A Teaghan Formica, MD [facility]   If 7PM-7AM, please contact night-coverage [email or web address]  08/14/2020, 7:17 AM

## 2020-08-14 NOTE — Plan of Care (Signed)
  Problem: Clinical Measurements: Goal: Diagnostic test results will improve Outcome: Progressing   Problem: Clinical Measurements: Goal: Cardiovascular complication will be avoided Outcome: Progressing

## 2020-08-15 ENCOUNTER — Encounter (HOSPITAL_COMMUNITY)
Admission: EM | Disposition: A | Discharge status: Home / Self Care | Payer: Self-pay | Source: Home / Self Care | Attending: Internal Medicine

## 2020-08-15 ENCOUNTER — Inpatient Hospital Stay (HOSPITAL_COMMUNITY): Payer: Medicare Other | Admitting: Anesthesiology

## 2020-08-15 ENCOUNTER — Encounter (HOSPITAL_COMMUNITY): Payer: Self-pay | Admitting: Internal Medicine

## 2020-08-15 ENCOUNTER — Inpatient Hospital Stay (HOSPITAL_COMMUNITY): Payer: Medicare Other

## 2020-08-15 DIAGNOSIS — R195 Other fecal abnormalities: Secondary | ICD-10-CM

## 2020-08-15 DIAGNOSIS — D509 Iron deficiency anemia, unspecified: Secondary | ICD-10-CM

## 2020-08-15 DIAGNOSIS — D500 Iron deficiency anemia secondary to blood loss (chronic): Secondary | ICD-10-CM

## 2020-08-15 DIAGNOSIS — D649 Anemia, unspecified: Secondary | ICD-10-CM | POA: Diagnosis not present

## 2020-08-15 DIAGNOSIS — D123 Benign neoplasm of transverse colon: Secondary | ICD-10-CM

## 2020-08-15 DIAGNOSIS — D125 Benign neoplasm of sigmoid colon: Secondary | ICD-10-CM

## 2020-08-15 DIAGNOSIS — D490 Neoplasm of unspecified behavior of digestive system: Secondary | ICD-10-CM

## 2020-08-15 HISTORY — PX: BIOPSY: SHX5522

## 2020-08-15 HISTORY — PX: COLONOSCOPY: SHX5424

## 2020-08-15 HISTORY — PX: POLYPECTOMY: SHX5525

## 2020-08-15 HISTORY — PX: ESOPHAGOGASTRODUODENOSCOPY: SHX5428

## 2020-08-15 LAB — CBC
HCT: 31.1 % — ABNORMAL LOW (ref 39.0–52.0)
Hemoglobin: 9.5 g/dL — ABNORMAL LOW (ref 13.0–17.0)
MCH: 22.5 pg — ABNORMAL LOW (ref 26.0–34.0)
MCHC: 30.5 g/dL (ref 30.0–36.0)
MCV: 73.5 fL — ABNORMAL LOW (ref 80.0–100.0)
Platelets: 316 10*3/uL (ref 150–400)
RBC: 4.23 MIL/uL (ref 4.22–5.81)
RDW: 24.8 % — ABNORMAL HIGH (ref 11.5–15.5)
WBC: 5.9 10*3/uL (ref 4.0–10.5)
nRBC: 0.5 % — ABNORMAL HIGH (ref 0.0–0.2)

## 2020-08-15 LAB — BPAM RBC
Blood Product Expiration Date: 202112302359
Blood Product Expiration Date: 202112312359
Blood Product Expiration Date: 202201012359
ISSUE DATE / TIME: 202112041500
ISSUE DATE / TIME: 202112041816
ISSUE DATE / TIME: 202112050945
Unit Type and Rh: 6200
Unit Type and Rh: 6200
Unit Type and Rh: 6200

## 2020-08-15 LAB — TYPE AND SCREEN
ABO/RH(D): A POS
Antibody Screen: NEGATIVE
Unit division: 0
Unit division: 0
Unit division: 0

## 2020-08-15 LAB — GLUCOSE, CAPILLARY
Glucose-Capillary: 98 mg/dL (ref 70–99)
Glucose-Capillary: 85 mg/dL (ref 70–99)
Glucose-Capillary: 94 mg/dL (ref 70–99)
Glucose-Capillary: 103 mg/dL — ABNORMAL HIGH (ref 70–99)

## 2020-08-15 LAB — SURGICAL PCR SCREEN
MRSA, PCR: NEGATIVE
Staphylococcus aureus: NEGATIVE

## 2020-08-15 IMAGING — CT CT ABD-PELV W/ CM
2 of 5 series · 15 of 46 positions shown, 17 images · IV contrast (Omni 300)
Comparison: [DATE]

CLINICAL DATA: Gastric mass seen endoscopy.

EXAM:
CT ABDOMEN AND PELVIS WITH CONTRAST
TECHNIQUE: Multidetector CT imaging of the abdomen and pelvis was performed
using the standard protocol following bolus administration of
intravenous contrast.
CONTRAST:  100mL OMNIPAQUE IOHEXOL 300 MG/ML  SOLN

[Series 3: a/p w/ 5mm · axial · 0.74mm/px · z∈[-518,-113]mm · 12 of 93 slices shown, 14 images]
[im 6/93  soft-tissue]
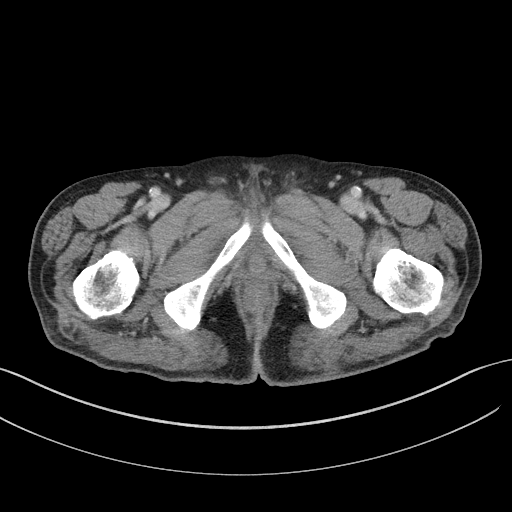
[im 6/93  bone]
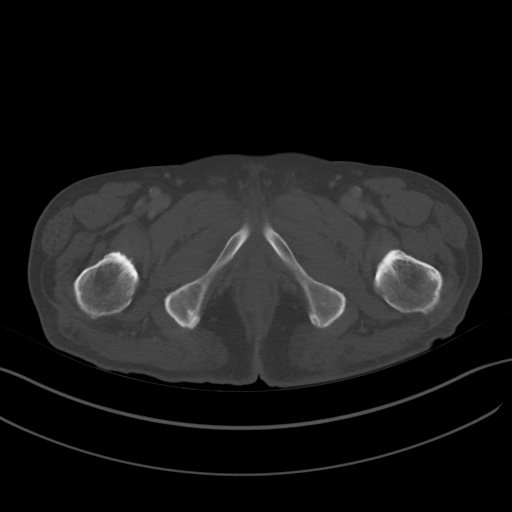
[im 17/93  soft-tissue]
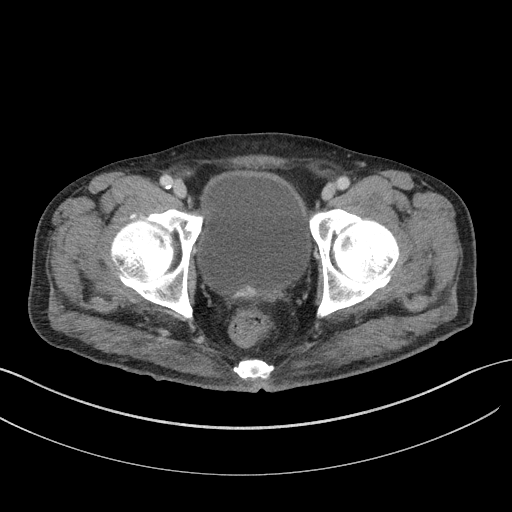
[im 22/93  soft-tissue]
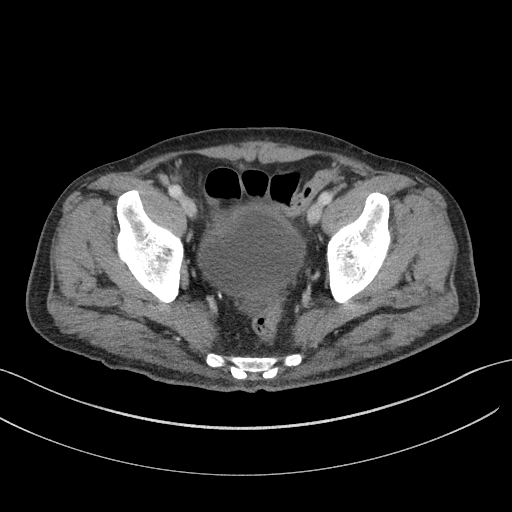
[im 28/93  soft-tissue]
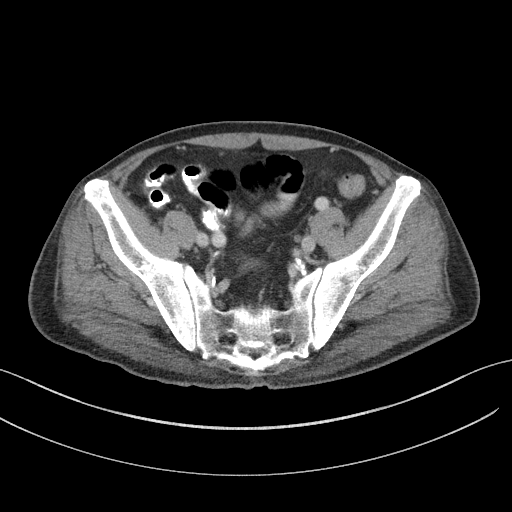
[im 38/93  soft-tissue]
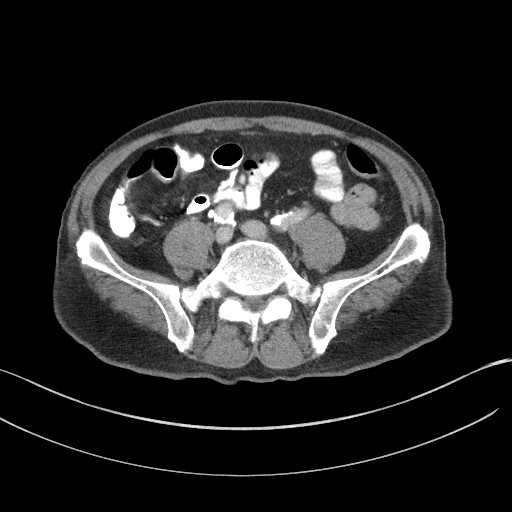
[im 44/93  soft-tissue]
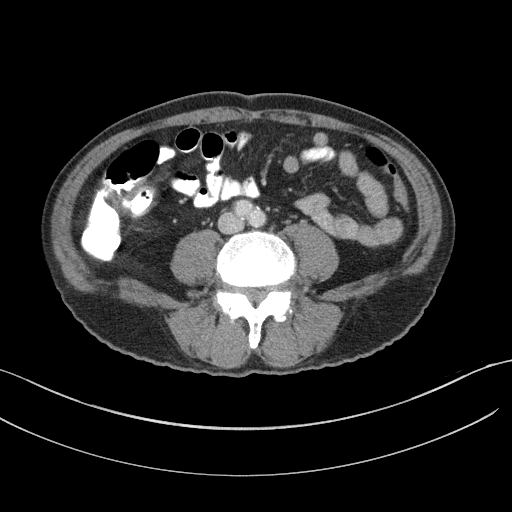
[im 49/93  soft-tissue]
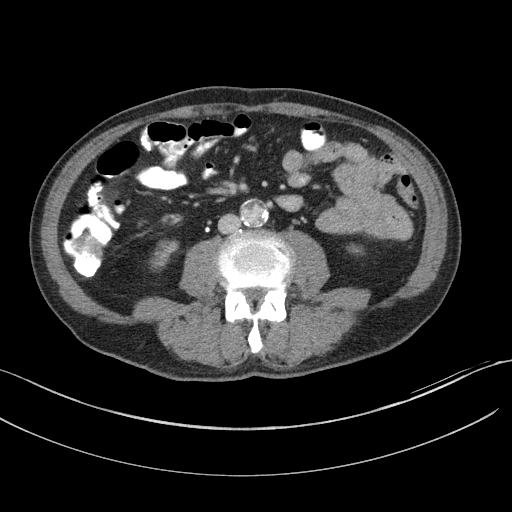
[im 60/93  soft-tissue]
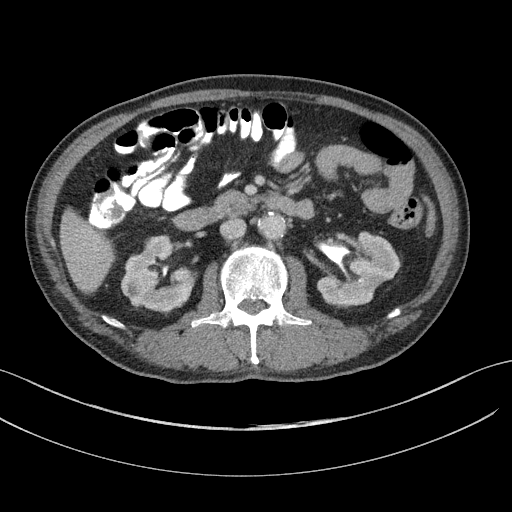
[im 65/93  soft-tissue]
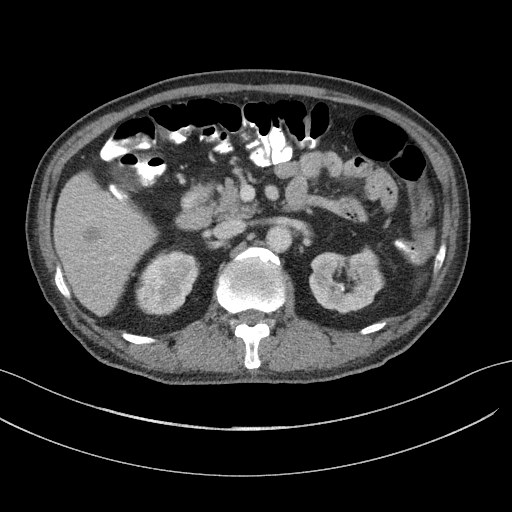
[im 65/93  bone]
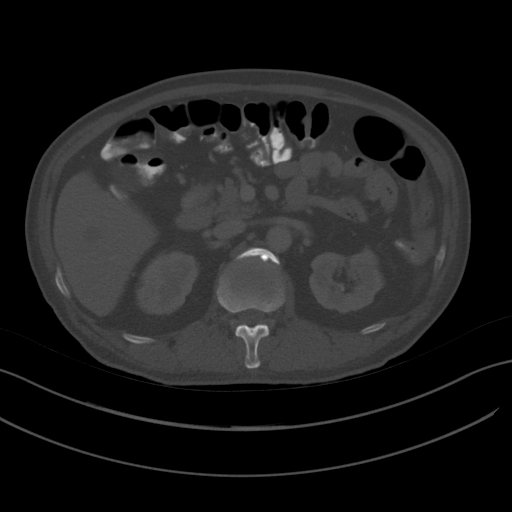
[im 71/93  soft-tissue]
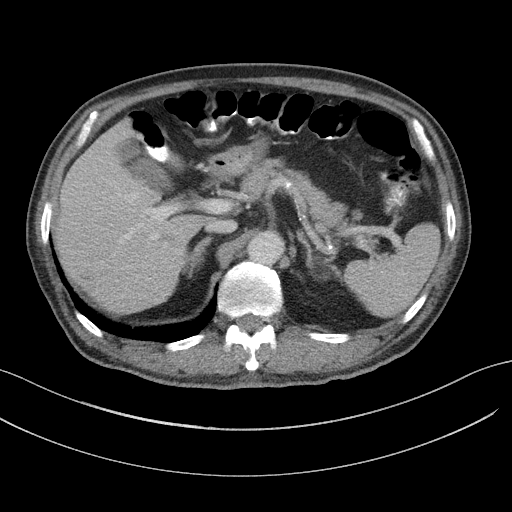
[im 82/93  soft-tissue]
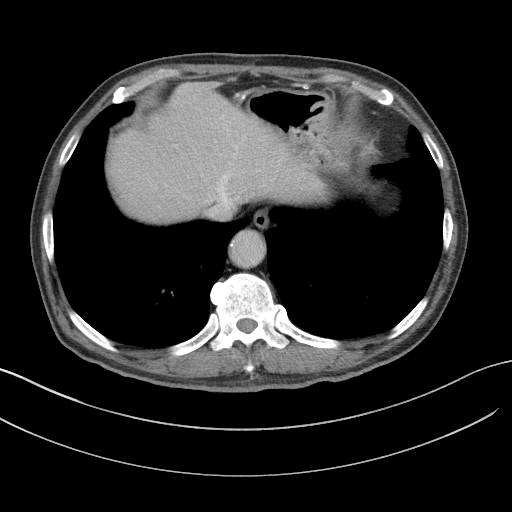
[im 87/93  soft-tissue]
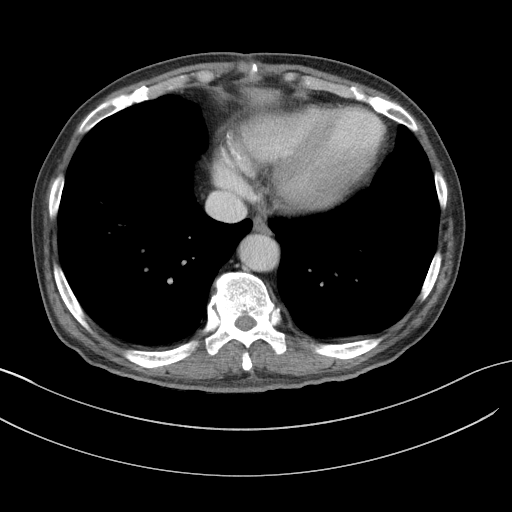

[Series 6: a/p w/ cor · coronal · 0.76mm/px · 3 of 132 slices shown]
[im 44/132  soft-tissue]
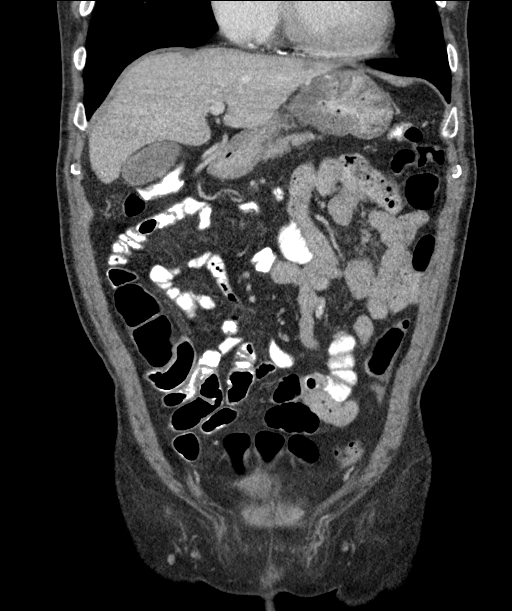
[im 59/132  soft-tissue]
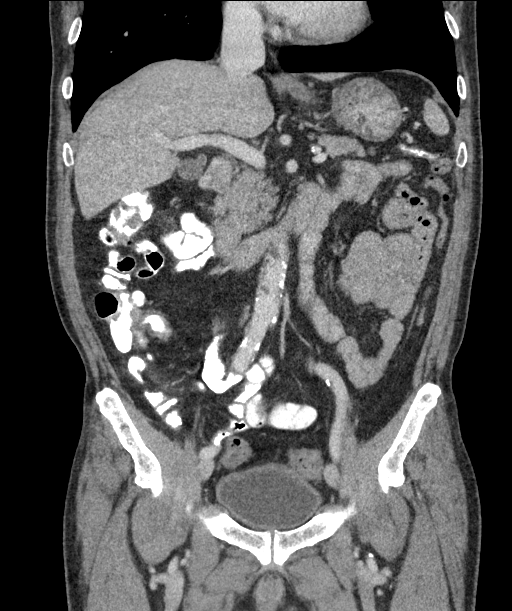
[im 73/132  soft-tissue]
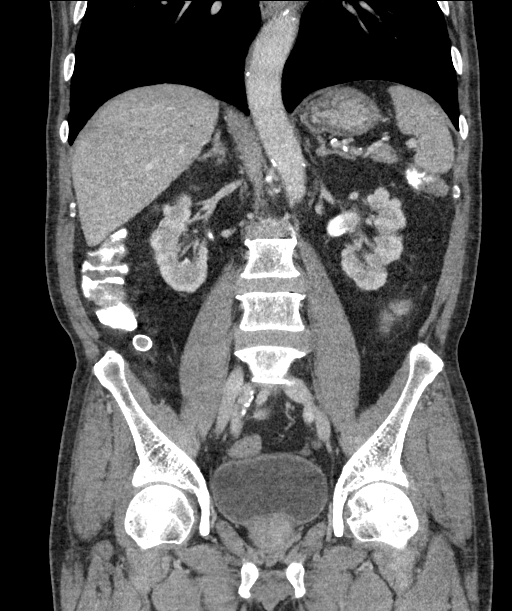

[15 of 46 positions shown; findings below may reference images not displayed]

FINDINGS: Lower chest: The lung bases are clear of an acute process. No
worrisome pulmonary nodules to suggest metastatic disease. Heart is
normal in size. No pericardial effusion. Age advanced coronary
artery and aortic calcifications are noted.

Hepatobiliary: 3 or 4 small hepatic lesions are identified. These
are worrisome for metastatic disease. The largest and most

The gallbladder is unremarkable. No intra or extrahepatic biliary
dilatation.

Pancreas: No mass, inflammation or ductal dilatation.

Spleen: Normal size. No focal lesions.

Adrenals/Urinary Tract: The adrenal glands are unremarkable.

Moderate cortical irregularity involving both kidneys could be due
to fetal lobulations or parenchymal scarring. There are small
bilateral renal cysts. There is also an indeterminate 14.5 mm
partially exophytic lesion involving the interpolar region of the
right kidney anteriorly. On the prior CT scan this measured
approximately 26 Hounsfield units measures 68 Hounsfield units on
the postcontrast images and is worrisome for a small enhancing renal
cell carcinoma. No significant collecting system abnormalities.

The bladder is unremarkable.

Stomach/Bowel: There is a large infiltrating soft tissue mass
involving the body region of the stomach. This appears to be
partially circumferential but is mainly involving the lesser
curvature wrapping around the inferior aspect of the stomach. Best
size estimate is 8.2 x 6.3 cm. The duodenum, small bowel and colon
are unremarkable. The terminal ileum and appendix are normal. No
colonic lesions are identified.

Vascular/Lymphatic: The aorta is normal in caliber. Scattered
atherosclerotic calcifications. Moderate calcifications involving
the iliac arteries and scattered branch vessel calcifications. The
major venous structures are patent.

Do not see any perigastric, gastrohepatic ligament or celiac axis
lymphadenopathy. No retroperitoneal lymphadenopathy.

Reproductive: The prostate and seminal vesicles are unremarkable.
There is mild median lobe hypertrophy the prostate gland minimally
impressing on the base the bladder.

Other: No pelvic adenopathy. There is a very small amount of free
pelvic fluid noted. No inguinal mass or inguinal adenopathy.

Musculoskeletal: No significant bony findings. Few small scattered
sclerotic lesions likely benign bone islands.
IMPRESSION: 1. Large infiltrating soft tissue mass involving the body region of
the stomach consistent with neoplasm. No adjacent lymphadenopathy.
2. Small hepatic lesions most consistent with metastatic disease.
MRI or PET-CT may be helpful for more definitive evaluation/staging.
3. 14.5 mm indeterminate partially exophytic lesion involving the
interpolar region of the right kidney anteriorly. This is suspicious
for a small enhancing renal cell carcinoma. MRI abdomen without and
with contrast may be helpful for further evaluation.
4. Age advanced coronary artery and aortic calcifications.
5. Aortic atherosclerosis.

Aortic Atherosclerosis ([4U]-[4U]).

## 2020-08-15 SURGERY — EGD (ESOPHAGOGASTRODUODENOSCOPY)
Anesthesia: General

## 2020-08-15 MED ORDER — PHENYLEPHRINE HCL (PRESSORS) 10 MG/ML IV SOLN
INTRAVENOUS | Status: DC | PRN
  Administered 2020-08-15 (×3): 80 ug via INTRAVENOUS

## 2020-08-15 MED ORDER — ENSURE ENLIVE PO LIQD
237.0000 mL | Freq: Two times a day (BID) | ORAL | Status: DC
  Administered 2020-08-16: 237 mL via ORAL

## 2020-08-15 MED ORDER — PROPOFOL 500 MG/50ML IV EMUL
INTRAVENOUS | Status: DC | PRN
  Administered 2020-08-15: 100 ug/kg/min via INTRAVENOUS

## 2020-08-15 MED ORDER — IOHEXOL 9 MG/ML PO SOLN
500.0000 mL | ORAL | Status: AC
  Administered 2020-08-15 (×2): 500 mL via ORAL

## 2020-08-15 MED ORDER — PROPOFOL 10 MG/ML IV BOLUS
INTRAVENOUS | Status: DC | PRN
  Administered 2020-08-15: 30 mg via INTRAVENOUS

## 2020-08-15 MED ORDER — LACTATED RINGERS IV SOLN: INTRAVENOUS | Status: DC | PRN

## 2020-08-15 MED ORDER — IOHEXOL 300 MG/ML  SOLN
100.0000 mL | Freq: Once | INTRAMUSCULAR | Status: AC | PRN
  Administered 2020-08-15: 100 mL via INTRAVENOUS

## 2020-08-15 MED ORDER — LIDOCAINE 2% (20 MG/ML) 5 ML SYRINGE
INTRAMUSCULAR | Status: DC | PRN
  Administered 2020-08-15: 60 mg via INTRAVENOUS

## 2020-08-15 NOTE — Plan of Care (Signed)
  Problem: Education: Goal: Knowledge of General Education information will improve Description: Including pain rating scale, medication(s)/side effects and non-pharmacologic comfort measures Outcome: Completed/Met   Problem: Health Behavior/Discharge Planning: Goal: Ability to manage health-related needs will improve Outcome: Completed/Met   Problem: Clinical Measurements: Goal: Will remain free from infection Outcome: Completed/Met Goal: Diagnostic test results will improve Outcome: Completed/Met Goal: Cardiovascular complication will be avoided Outcome: Completed/Met   Problem: Activity: Goal: Risk for activity intolerance will decrease Outcome: Completed/Met   Problem: Nutrition: Goal: Adequate nutrition will be maintained Outcome: Completed/Met

## 2020-08-15 NOTE — Transfer of Care (Signed)
Immediate Anesthesia Transfer of Care Note  Patient: Jimmy Blankenship  Procedure(s) Performed: ESOPHAGOGASTRODUODENOSCOPY (EGD) (N/A ) COLONOSCOPY (N/A ) POLYPECTOMY BIOPSY  Patient Location: PACU  Anesthesia Type:MAC  Level of Consciousness: awake, alert  and oriented  Airway & Oxygen Therapy: Patient Spontanous Breathing  Post-op Assessment: Report given to RN, Post -op Vital signs reviewed and stable and Patient moving all extremities  Post vital signs: Reviewed and stable  Last Vitals:  Vitals Value Taken Time  BP 106/64 08/15/20 0901  Temp 36.2 C 08/15/20 0900  Pulse 58 08/15/20 0903  Resp 18 08/15/20 0903  SpO2 99 % 08/15/20 0903  Vitals shown include unvalidated device data.  Last Pain:  Vitals:   08/15/20 0900  TempSrc: Temporal  PainSc:          Complications: No complications documented.

## 2020-08-15 NOTE — Op Note (Signed)
[facility] Patient Name: Jimmy Blankenship Procedure Date : 08/15/2020 MRN: [id number] Attending MD: Ladene Artist , MD Date of Birth: 07-20-1951 CSN: [id number] Age: 69 Admit Type: Inpatient Procedure:                Colonoscopy Indications:              Heme positive stool, Unexplained iron deficiency                            anemia Providers:                Pricilla Riffle. Fuller Plan, MD, Carmie End, RN, Cherylynn Ridges, Technician Referring MD:             [facility] Medicines:                Monitored Anesthesia Care Complications:            No immediate complications. Estimated blood loss:                            None. Estimated Blood Loss:     Estimated blood loss: none. Procedure:                Pre-Anesthesia Assessment:                           - Prior to the procedure, a History and Physical                            was performed, and patient medications and                            allergies were reviewed. The patient's tolerance of                            previous anesthesia was also reviewed. The risks                            and benefits of the procedure and the sedation                            options and risks were discussed with the patient.                            All questions were answered, and informed consent                            was obtained. Prior Anticoagulants: The patient has                            taken Plavix (clopidogrel), last dose was 4 days                            prior to procedure. ASA Grade  Assessment: II - A                            patient with mild systemic disease. After reviewing                            the risks and benefits, the patient was deemed in                            satisfactory condition to undergo the procedure.                           After obtaining informed consent, the colonoscope                            was passed under direct vision. Throughout the                             procedure, the patient's blood pressure, pulse, and                            oxygen saturations were monitored continuously. The                            PCF-H190DL ([id number]) Olympus pediatric colonoscope                            was introduced through the anus and advanced to the                            the cecum, identified by appendiceal orifice and                            ileocecal valve. The ileocecal valve, appendiceal                            orifice, and rectum were photographed. The quality                            of the bowel preparation was good. The colonoscopy                            was performed without difficulty. The patient                            tolerated the procedure well. Scope In: 8:16:04 AM Scope Out: 8:38:52 AM Scope Withdrawal Time: 0 hours 19 minutes 26 seconds  Total Procedure Duration: 0 hours 22 minutes 48 seconds  Findings:      The perianal and digital rectal examinations were normal.      A 10 mm polyp was found in the sigmoid colon. The polyp was       pedunculated. The polyp was removed with a cold snare. Resection and       retrieval were complete.  A 5 mm polyp was found in the transverse colon. The polyp was sessile.       The polyp was removed with a cold snare. Resection and retrieval were       complete.      Internal hemorrhoids were found during retroflexion. The hemorrhoids       were small and Grade I (internal hemorrhoids that do not prolapse).      The exam was otherwise without abnormality on direct and retroflexion       views. Impression:               - One 10 mm polyp in the sigmoid colon, removed                            with a cold snare. Resected and retrieved.                           - One 5 mm polyp in the transverse colon, removed                            with a cold snare. Resected and retrieved.                           - Internal hemorrhoids.                           -  The examination was otherwise normal on direct                            and retroflexion views. Recommendation:           - Repeat colonoscopy after studies are complete for                            surveillance based on pathology results with Dr.                            Carmell Austria.                           - Resume Plavix (clopidogrel) in 2 days at prior                            dose. Refer to managing physician for further                            adjustment of therapy.                           - Patient has a contact number available for                            emergencies. The signs and symptoms of potential                            delayed complications were discussed with the  patient. Return to normal activities tomorrow.                            Written discharge instructions were provided to the                            patient.                           - Resume previous diet.                           - Continue present medications.                           - Await pathology results.                           - EGD today. Procedure Code(s):        --- Professional ---                           [phone], Colonoscopy, flexible; with removal of                            tumor(s), polyp(s), or other lesion(s) by snare                            technique Diagnosis Code(s):        --- Professional ---                           K63.5, Polyp of colon                           K64.0, First degree hemorrhoids                           R19.5, Other fecal abnormalities                           D50.9, Iron deficiency anemia, unspecified CPT copyright 2019 [employer]. All rights reserved. The codes documented in this report are preliminary and upon coder review may  be revised to meet current compliance requirements. Ladene Artist, MD 08/15/2020 8:44:57 AM This report has been signed electronically. Number of Addenda:  0

## 2020-08-15 NOTE — Anesthesia Preprocedure Evaluation (Signed)
Anesthesia Evaluation  Patient identified by MRN, date of birth, ID band Patient awake    Reviewed: Allergy & Precautions, H&P , NPO status , Patient's Chart, lab work & pertinent test results  Airway Mallampati: II   Neck ROM: full    Dental   Pulmonary former smoker,    breath sounds clear to auscultation       Cardiovascular hypertension, + CAD and + CABG   Rhythm:regular Rate:Normal  EF 40-45%   Neuro/Psych    GI/Hepatic   Endo/Other  diabetes, Type 2  Renal/GU      Musculoskeletal   Abdominal   Peds  Hematology  (+) Blood dyscrasia, anemia ,   Anesthesia Other Findings   Reproductive/Obstetrics                             Anesthesia Physical Anesthesia Plan  ASA: III  Anesthesia Plan: General   Post-op Pain Management:    Induction: Intravenous  PONV Risk Score and Plan: 2 and Propofol infusion and Treatment may vary due to age or medical condition  Airway Management Planned: Nasal Cannula  Additional Equipment:   Intra-op Plan:   Post-operative Plan:   Informed Consent: I have reviewed the patients History and Physical, chart, labs and discussed the procedure including the risks, benefits and alternatives for the proposed anesthesia with the patient or authorized representative who has indicated his/her understanding and acceptance.       Plan Discussed with: CRNA, Anesthesiologist and Surgeon  Anesthesia Plan Comments:         Anesthesia Quick Evaluation

## 2020-08-15 NOTE — Anesthesia Postprocedure Evaluation (Signed)
Anesthesia Post Note  Patient: Jimmy Blankenship  Procedure(s) Performed: ESOPHAGOGASTRODUODENOSCOPY (EGD) (N/A ) COLONOSCOPY (N/A ) POLYPECTOMY BIOPSY     Patient location during evaluation: PACU Anesthesia Type: MAC Level of consciousness: awake and alert Pain management: pain level controlled Vital Signs Assessment: post-procedure vital signs reviewed and stable Respiratory status: spontaneous breathing, nonlabored ventilation, respiratory function stable and patient connected to nasal cannula oxygen Cardiovascular status: blood pressure returned to baseline and stable Postop Assessment: no apparent nausea or vomiting Anesthetic complications: no   No complications documented.  Last Vitals:  Vitals:   08/15/20 0925 08/15/20 1003  BP: 123/73 (!) 142/69  Pulse: 64 67  Resp: (!) 21 20  Temp:  (!) 36.4 C  SpO2: 100% 100%    Last Pain:  Vitals:   08/15/20 1003  TempSrc: Oral  PainSc:                  Dia Jefferys

## 2020-08-15 NOTE — Op Note (Signed)
[facility] Patient Name: Jimmy Blankenship Procedure Date : 08/15/2020 MRN: [id number] Attending MD: Ladene Artist , MD Date of Birth: 20-Nov-1950 CSN: [id number] Age: 69 Admit Type: Inpatient Procedure:                Upper GI endoscopy Indications:              Unexplained iron deficiency anemia, Heme positive                            stool Providers:                Pricilla Riffle. Fuller Plan, MD, Carmie End, RN, Cherylynn Ridges, Technician Referring MD:             [facility] Medicines:                Monitored Anesthesia Care Complications:            No immediate complications. Estimated Blood Loss:     Estimated blood loss was minimal. Procedure:                Pre-Anesthesia Assessment:                           - Prior to the procedure, a History and Physical                            was performed, and patient medications and                            allergies were reviewed. The patient's tolerance of                            previous anesthesia was also reviewed. The risks                            and benefits of the procedure and the sedation                            options and risks were discussed with the patient.                            All questions were answered, and informed consent                            was obtained. Prior Anticoagulants: The patient has                            taken Plavix (clopidogrel), last dose was 4 days                            prior to procedure. ASA Grade Assessment: II - A  patient with mild systemic disease. After reviewing                            the risks and benefits, the patient was deemed in                            satisfactory condition to undergo the procedure.                           After obtaining informed consent, the endoscope was                            passed under direct vision. Throughout the                            procedure, the  patient's blood pressure, pulse, and                            oxygen saturations were monitored continuously. The                            GIF-H190 ([id number]) Olympus gastroscope was                            introduced through the mouth, and advanced to the                            second part of duodenum. The upper GI endoscopy was                            accomplished without difficulty. The patient                            tolerated the procedure well. Scope In: Scope Out: Findings:      The examined esophagus was normal.      A large, fungating, friable, partially circumferential (involving       one-third of the lumen circumference) mass with no bleeding and no       stigmata of recent bleeding was found on the lesser curvature of the       stomach and on the posterior wall of the stomach, approximately 5 cm by       3 cm. Biopsies were taken with a cold forceps for histology.      The exam of the stomach was otherwise normal.      The duodenal bulb and second portion of the duodenum were normal. Impression:               - Normal esophagus.                           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 Biopsied.                           - Normal duodenal bulb and second portion of the                            duodenum. Recommendation:           - Patient has a contact number available for                            emergencies. The signs and symptoms of potential                            delayed complications were discussed with the                            patient. Return to normal activities tomorrow.                            Written discharge instructions were provided to the                            patient.                           - Resume previous diet.                           - Continue present medications.                            - Await pathology results.                           - Resume Plavix (clopidogrel) at prior dose in 2                            days. Refer to managing physician for further                            adjustment of therapy.                           - Outpatient GI follow up with Dr. Carmell Austria                           - Perform a CT scan (computed tomography) of                            abdomen with contrast and pelvis with contrast at                            the next available appointment. Procedure Code(s):        --- Professional ---  21117, Esophagogastroduodenoscopy, flexible,                            transoral; with biopsy, single or multiple Diagnosis Code(s):        --- Professional ---                           D49.0, Neoplasm of unspecified behavior of                            digestive system                           D50.9, Iron deficiency anemia, unspecified                           R19.5, Other fecal abnormalities CPT copyright 2019 American Medical Association. All rights reserved. The codes documented in this report are preliminary and upon coder review may  be revised to meet current compliance requirements. Ladene Artist, MD 08/15/2020 9:03:36 AM This report has been signed electronically. Number of Addenda: 0

## 2020-08-15 NOTE — Anesthesia Procedure Notes (Signed)
Procedure Name: MAC Date/Time: 08/15/2020 8:09 AM Performed by: Amadeo Garnet, CRNA Pre-anesthesia Checklist: Emergency Drugs available, Patient identified, Suction available and Patient being monitored Patient Re-evaluated:Patient Re-evaluated prior to induction Oxygen Delivery Method: Nasal cannula Preoxygenation: Pre-oxygenation with 100% oxygen Induction Type: IV induction Placement Confirmation: positive ETCO2 Dental Injury: Teeth and Oropharynx as per pre-operative assessment

## 2020-08-15 NOTE — Progress Notes (Signed)
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  Since July, he has been getting better from a cardiac standpoint.  Recently he noticed black stool again.  He was advised to stop taking Plavix and his stool have improved since. Patient went to see his cardiology for shortness of breath on exertion, lab work does show a hemoglobin of 5.4 and patient was sent to the ED.  His hemoglobin was found to be 5.5.  GI has been consulted, patient underwent  upper endoscopy and colonoscopy on 12/06. Endoscopy showed a mass in the lesser curvature of stomach 5 to 3 cm. Colonoscopy he had two small polyps removed.  Awaiting Pathology report and plan for CT scan abdomen pelvis.     Assessment & Plan:   Principal Problem:   Symptomatic anemia Active Problems:   CAD (coronary artery disease)   DM2 (diabetes mellitus, type 2) (HCC)   HTN (hypertension)   HLD (hyperlipidemia)   Upper GI bleeding   Iron deficiency anemia   Occult blood in stools   Benign neoplasm of transverse colon   Benign neoplasm of sigmoid colon  1-Acute blood loss anemia, upper GI bleed: -Patient presented with shortness of breath, melena and a hemoglobin of 5.5. -Last hemoglobin in August was at 10.2. -Patient received 3 units of packed red blood cell this admission.  -Plan for endoscopy colonoscopy on Monday. -Per GI ok to resume plavix in 2 days.  -Patient received IV Feraheme on 12/4. -hb stable at 9.  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  Colonoscopy: 2 polyps removed.  Plan for CT scan abdomen and pelvis.  Awaiting pathology report.   2-CAD s/p CABG;  Continue beta-blockers.  Holding aspirin and Plavix due to acute GI bleed. Resume aspirin and Plavix when okay by GI.  Work-up in process  HTN;  Continue with BB.   HLD;  Continue with Lipitor.   DM type 2;  Last HbA1c 6.4 03/2020 Continue to hold glucophage.  Continue with SSI.     -     Estimated body mass index is 22.71 kg/m as calculated from the following:   Height as of this encounter: 5\' 7"  (1.702 m).   Weight as of this encounter: 65.8 kg.   DVT prophylaxis: SCDs Code Status: DNR Family Communication: Care discussed with patient and son in law.  Disposition Plan:  Status is: Inpatient  Remains inpatient appropriate because:IV treatments appropriate due to intensity of illness or inability to take PO   Dispo: The patient is from: Home              Anticipated d/c is to: Home              Anticipated d/c date is: 2 days              Patient currently is not medically stable to d/c.        Consultants:   GI  Procedures:   Endoscopy colonoscopy planning on 12/6  Antimicrobials:    Subjective: Patient came from endoscopy . He remember he was told about polyps that were remove. I informed him result of endoscopy. Use  I pad translator.    Objective: Vitals:   08/15/20 0910 08/15/20 0920 08/15/20 0925 08/15/20 1003  BP: 114/64  123/73 (!) 142/69  Pulse: 68 66 64 67  Resp: 15 18 (!) 21 20  Temp:    (!) 97.5 F (36.4 C)  TempSrc:    Oral  SpO2: 99% 100% 100% 100%  Weight:      Height:        Intake/Output Summary (Last 24 hours) at 08/15/2020 1236 Last data filed at 08/15/2020 0854 Gross per 24 hour  Intake 3325 ml  Output 0 ml  Net 3325 ml   Filed Weights   08/13/20 0225 08/13/20 2118 08/15/20 0732  Weight: 65.2 kg 68.3 kg 65.8 kg    Examination:  General exam: NAD Respiratory system: CTA Cardiovascular system: S 1, S 2 RRR Gastrointestinal system: BS present, soft, nt Central  nervous system: alert  Extremities: symmetric power.   Data Reviewed: I have personally reviewed following labs and imaging studies  CBC: Recent Labs  Lab 08/13/20 0313 08/14/20 0125 08/14/20 0508 08/14/20 1704 08/15/20 0445  WBC 6.7 5.8 6.2 6.5 5.9  NEUTROABS 3.8  --   --   --   --   HGB 5.5* 7.3* 7.6* 9.4* 9.5*  HCT 19.1* 23.7* 24.1* 30.1* 31.1*  MCV 64.7* 70.1* 69.9* 73.2* 73.5*  PLT 347 298 304 331 338   Basic Metabolic Panel: Recent Labs  Lab 08/13/20 0313 08/14/20 0508  NA 135 138  K 3.9 4.2  CL 106 107  CO2 21* 24  GLUCOSE 112* 88  BUN 24* 12  CREATININE 1.06 1.07  CALCIUM 8.5* 8.6*   GFR: Estimated Creatinine Clearance: 62.3 mL/min (by C-G formula based on SCr of 1.07 mg/dL). Liver Function Tests: Recent Labs  Lab 08/13/20 0313  AST 16  ALT 16  ALKPHOS 58  BILITOT 0.5  PROT 5.9*  ALBUMIN 3.2*   No results for input(s): LIPASE, AMYLASE in the last 168 hours. No results for input(s): AMMONIA in the last 168 hours. Coagulation Profile: No results for input(s): INR, PROTIME in the last 168 hours. Cardiac Enzymes: No results for input(s): CKTOTAL, CKMB, CKMBINDEX, TROPONINI in the last 168 hours. BNP (last 3 results) No results for input(s): PROBNP in the last 8760 hours. HbA1C: No results for input(s): HGBA1C in the last 72 hours. CBG: Recent Labs  Lab 08/14/20 1141 08/14/20 1630 08/14/20 2200 08/15/20 0635 08/15/20 1156  GLUCAP 106* 110* 85 98 85   Lipid Profile: No results for input(s): CHOL, HDL, LDLCALC, TRIG, CHOLHDL, LDLDIRECT in the last 72 hours. Thyroid Function Tests: No results for input(s): TSH, T4TOTAL, FREET4, T3FREE, THYROIDAB in the last 72 hours. Anemia Panel: Recent Labs    08/13/20 0427  VITAMINB12 2,781*  FOLATE 7.7  FERRITIN 5*  TIBC 375  IRON 16*  RETICCTPCT 2.2   Sepsis Labs: No results for input(s): PROCALCITON, LATICACIDVEN in the last 168 hours.  Recent Results (from the past 240 hour(s))  Resp Panel  by RT-PCR (Flu A&B, Covid) Nasopharyngeal Swab     Status: None   Collection Time: 08/13/20  3:27 AM   Specimen: Nasopharyngeal Swab; Nasopharyngeal(NP) swabs in vial transport medium  Result Value Ref Range Status   SARS Coronavirus 2 by RT PCR NEGATIVE NEGATIVE Final    Comment: (NOTE) SARS-CoV-2 target nucleic acids are NOT DETECTED.  The SARS-CoV-2 RNA is generally detectable in upper respiratory specimens during the acute phase of infection. The lowest concentration of SARS-CoV-2 viral copies this  assay can detect is 138 copies/mL. A negative result does not preclude SARS-Cov-2 infection and should not be used as the sole basis for treatment or other patient management decisions. A negative result may occur with  improper specimen collection/handling, submission of specimen other than nasopharyngeal swab, presence of viral mutation(s) within the areas targeted by this assay, and inadequate number of viral copies(<138 copies/mL). A negative result must be combined with clinical observations, patient history, and epidemiological information. The expected result is Negative.  Fact Sheet for Patients:  EntrepreneurPulse.com.au  Fact Sheet for Healthcare Providers:  IncredibleEmployment.be  This test is no t yet approved or cleared by the [place] FDA and  has been authorized for detection and/or diagnosis of SARS-CoV-2 by FDA under an Emergency Use Authorization (EUA). This EUA will remain  in effect (meaning this test can be used) for the duration of the COVID-19 declaration under Section 564(b)(1) of the Act, 21 U.S.C.section 360bbb-3(b)(1), unless the authorization is terminated  or revoked sooner.       Influenza A by PCR NEGATIVE NEGATIVE Final   Influenza B by PCR NEGATIVE NEGATIVE Final    Comment: (NOTE) The Xpert Xpress SARS-CoV-2/FLU/RSV plus assay is intended as an aid in the diagnosis of influenza from Nasopharyngeal swab  specimens and should not be used as a sole basis for treatment. Nasal washings and aspirates are unacceptable for Xpert Xpress SARS-CoV-2/FLU/RSV testing.  Fact Sheet for Patients: EntrepreneurPulse.com.au  Fact Sheet for Healthcare Providers: IncredibleEmployment.be  This test is not yet approved or cleared by the [place] FDA and has been authorized for detection and/or diagnosis of SARS-CoV-2 by FDA under an Emergency Use Authorization (EUA). This EUA will remain in effect (meaning this test can be used) for the duration of the COVID-19 declaration under Section 564(b)(1) of the Act, 21 U.S.C. section 360bbb-3(b)(1), unless the authorization is terminated or revoked.  Performed at [facility], [address], [place], [place] [address]   Surgical pcr screen     Status: None   Collection Time: 08/14/20 10:44 PM   Specimen: Nasal Mucosa; Nasal Swab  Result Value Ref Range Status   MRSA, PCR NEGATIVE NEGATIVE Final   Staphylococcus aureus NEGATIVE NEGATIVE Final    Comment: (NOTE) The Xpert SA Assay (FDA approved for NASAL specimens in patients 37 years of age and older), is one component of a comprehensive surveillance program. It is not intended to diagnose infection nor to guide or monitor treatment. Performed at [facility], [place] [address], [place], [place] [address]          Radiology Studies: No results found.      Scheduled Meds: . atorvastatin  80 mg Oral Daily  . feeding supplement  237 mL Oral BID BM  . gabapentin  300 mg Oral Daily  . insulin aspart  0-15 Units Subcutaneous TID WC  . metoprolol tartrate  50 mg Oral BID  . pantoprazole  40 mg Oral BID   Continuous Infusions: . ferumoxytol Stopped (08/13/20 1326)     LOS: 2 days    Time spent: 35 minutes    Samar Venneman A Kato Wieczorek, MD [facility]   If 7PM-7AM, please contact night-coverage [email or web address]  08/15/2020, 12:36 PM

## 2020-08-15 NOTE — Progress Notes (Signed)
RN used interpreter to explain CHG wipes to patient prior to procedure. Pt understood and CHG wipes were completed at this time.   Eleanora Neighbor, RN

## 2020-08-15 NOTE — Interval H&P Note (Signed)
History and Physical Interval Note:  08/15/2020 8:01 AM  Jimmy Blankenship  has presented today for surgery, with the diagnosis of Anemia, Iron Deficiency, Dark stools, AntiPLT therapy.  The various methods of treatment have been discussed with the patient and family. After consideration of risks, benefits and other options for treatment, the patient has consented to  Procedure(s): ESOPHAGOGASTRODUODENOSCOPY (EGD) (N/A) COLONOSCOPY (N/A) as a surgical intervention.  The patient's history has been reviewed, patient examined, no change in status, stable for surgery.  I have reviewed the patient's chart and labs.  Questions were answered to the patient's satisfaction.     Pricilla Riffle. Fuller Plan

## 2020-08-16 ENCOUNTER — Other Ambulatory Visit: Payer: Self-pay | Admitting: Physician Assistant

## 2020-08-16 ENCOUNTER — Other Ambulatory Visit: Payer: Self-pay | Admitting: Oncology

## 2020-08-16 DIAGNOSIS — D649 Anemia, unspecified: Secondary | ICD-10-CM

## 2020-08-16 LAB — CBC
HCT: 32.3 % — ABNORMAL LOW (ref 39.0–52.0)
Hemoglobin: 9.9 g/dL — ABNORMAL LOW (ref 13.0–17.0)
MCH: 22.8 pg — ABNORMAL LOW (ref 26.0–34.0)
MCHC: 30.7 g/dL (ref 30.0–36.0)
MCV: 74.4 fL — ABNORMAL LOW (ref 80.0–100.0)
Platelets: 331 10*3/uL (ref 150–400)
RBC: 4.34 MIL/uL (ref 4.22–5.81)
RDW: 25.9 % — ABNORMAL HIGH (ref 11.5–15.5)
WBC: 6 10*3/uL (ref 4.0–10.5)
nRBC: 0 % (ref 0.0–0.2)

## 2020-08-16 LAB — GLUCOSE, CAPILLARY
Glucose-Capillary: 88 mg/dL (ref 70–99)
Glucose-Capillary: 111 mg/dL — ABNORMAL HIGH (ref 70–99)

## 2020-08-16 LAB — BASIC METABOLIC PANEL
Anion gap: 11 (ref 5–15)
BUN: 10 mg/dL (ref 8–23)
CO2: 23 mmol/L (ref 22–32)
Calcium: 9.1 mg/dL (ref 8.9–10.3)
Chloride: 106 mmol/L (ref 98–111)
Creatinine, Ser: 1.23 mg/dL (ref 0.61–1.24)
GFR, Estimated: >60 mL/min (ref >60)
Glucose, Bld: 134 mg/dL — ABNORMAL HIGH (ref 70–99)
Potassium: 3.9 mmol/L (ref 3.5–5.1)
Sodium: 140 mmol/L (ref 135–145)

## 2020-08-16 MED ORDER — ENSURE ENLIVE PO LIQD
237.0000 mL | Freq: Two times a day (BID) | ORAL | 12 refills | Status: DC
Start: 2020-08-16 — End: 2022-11-23

## 2020-08-16 MED ORDER — CLOPIDOGREL BISULFATE 75 MG PO TABS: 75.0000 mg | ORAL_TABLET | Freq: Every day | ORAL | 0 refills | Status: AC

## 2020-08-16 MED ORDER — PANTOPRAZOLE SODIUM 40 MG PO TBEC
40.0000 mg | DELAYED_RELEASE_TABLET | Freq: Two times a day (BID) | ORAL | 1 refills | Status: DC
Start: 2020-08-16 — End: 2020-10-18

## 2020-08-16 NOTE — Progress Notes (Signed)
Brief oncology note:  Dr. Julien Nordmann received request for consultation for this patient.  Noted to have a gastric mass on endoscopy and small liver lesions noted on CT scan.  Biopsy of gastric mass is currently pending.  The patient is for discharge today.  Per Dr. Julien Nordmann, will arrange for outpatient follow-up at the cancer center.  I spoke with the patient's daughter who was at the bedside.  I note that the the patient lives in [place], [place].  I gave her the option of being seen in [place] versus being seen at [facility].  The patient lives very close to our [facility] location and is agreeable to being seen there.  I have placed an ambulatory referral to Dr. Antonieta Pert office and have also notified the nurse navigator at [facility] location.  They will arrange for his new patient appointment and contact the patient's daughter with the date/time.  [name], DNP, AGPCNP-BC, AOCNP Mon/Tues/Thurs/Fri 7am-5pm; Off Wednesdays

## 2020-08-16 NOTE — Progress Notes (Signed)
DISCHARGE NOTE HOME Jimmy Blankenship to be discharged Home per MD order. Discussed prescriptions and follow up appointments with the patient. Prescriptions given to patient; medication list explained in detail. Patient verbalized understanding.  Skin clean, dry and intact without evidence of skin break down, no evidence of skin tears noted. IV catheter discontinued intact. Site without signs and symptoms of complications. Dressing and pressure applied. Pt denies pain at the site currently. No complaints noted.  Patient free of lines, drains, and wounds.   An After Visit Summary (AVS) was printed and given to the patient. Patient escorted via wheelchair, and discharged home via private auto.  Berneta Levins, RN

## 2020-08-16 NOTE — Discharge Summary (Signed)
Physician Discharge Summary  Jimmy Blankenship OAC:[id number] DOB: Dec 29, 1950 DOA: 08/13/2020  PCP: [name], MD  Admit date: 08/13/2020 Discharge date: 08/16/2020  Admitted From: Home  Disposition: Home   Recommendations for Outpatient Follow-up:  1. Follow up with PCP in 1-2 weeks 2. Please obtain BMP/CBC in one week 3. Follow up pathology report 4. Follow up with Dr Marin Olp oncologist in [place].  5. Follow up with primary cardiology.  6. Monitor hb 7. Will defer MRI to further evaluate Renal lesion to oncologist      Discharge Condition: Stable.  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  Since July, he has been getting better from a cardiac standpoint.  Recently he noticed black stool again.  He was advised to stop taking Plavix and his stool have improved since. Patient went to see his cardiology for shortness of breath on exertion, lab work does show a hemoglobin of 5.4 and patient was sent to the ED.  His hemoglobin was found to be 5.5.  GI has been consulted, patient underwent  upper endoscopy and colonoscopy on 12/06. Endoscopy showed a mass in the lesser curvature of stomach 5 to 3 cm. Colonoscopy he had two small polyps removed.  Awaiting Pathology report and plan for CT scan abdomen pelvis.    1-Acute blood loss anemia, upper GI bleed: Gastric Mass  -Patient presented with shortness of breath, melena and a hemoglobin of 5.5. -Last hemoglobin in August was at 10.2. -Patient received 3 units of packed red blood cell this admission.  -Plan for endoscopy colonoscopy on Monday. -Per GI ok to resume plavix in 2 days.  -Patient received IV Feraheme  on 12/4. -hb stable at 9.  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  Colonoscopy: 2 polyps removed.  CT scan abdomen and pelvis. He has two lesion in the liver, lesion kidney, gastric mass.  Awaiting pathology report.   2-CAD s/p CABG;  Continue beta-blockers.  Holding aspirin and Plavix due to acute GI bleed. Resume plavix tomorrow per GI  HTN;  Continue with BB.   HLD;  Continue with Lipitor.   DM type 2;  Last HbA1c 6.4 03/2020 Continue to hold Glucophage at discharge. CBG in the 80 Continue with SSI.     -   Discharge Diagnoses:  Principal Problem:   Symptomatic anemia Active Problems:   CAD (coronary artery disease)   DM2 (diabetes mellitus, type 2) (HCC)   HTN (hypertension)   HLD (hyperlipidemia)   Upper GI bleeding   Iron deficiency anemia   Occult blood in stools   Benign neoplasm of transverse colon   Benign neoplasm of sigmoid colon    Discharge Instructions  Discharge Instructions    Diet - low sodium heart healthy   Complete by: As directed    Increase activity slowly   Complete by: As directed      Allergies as of 08/16/2020   No Known Allergies     Medication List    TAKE these medications   atorvastatin 80 MG tablet Commonly known as: LIPITOR Take 1 tablet (80 mg total) by mouth daily.   clopidogrel 75 MG tablet Commonly known as: Plavix Take 1 tablet (75 mg total)  by mouth daily. Start taking on: August 17, 2020   feeding supplement Liqd Take 237 mLs by mouth 2 (two) times daily between meals.   gabapentin 300 MG capsule Commonly known as: NEURONTIN Take 300 mg by mouth daily.   metFORMIN 1000 MG tablet Commonly known as: GLUCOPHAGE Take 1,000 mg by mouth 2 (two) times daily.   metoprolol tartrate 50 MG tablet Commonly known as: LOPRESSOR Take 1 tablet (50 mg total) by mouth 2 (two) times daily.   pantoprazole 40 MG tablet Commonly known as: PROTONIX Take 1 tablet  (40 mg total) by mouth 2 (two) times daily.   VITAMIN B-12 PO Take 1 tablet by mouth daily.       No Known Allergies  Consultations:  Oncology  GI   Procedures/Studies: CT ABDOMEN PELVIS W CONTRAST  Result Date: 08/16/2020 CLINICAL DATA:  Gastric mass seen endoscopy. EXAM: CT ABDOMEN AND PELVIS WITH CONTRAST TECHNIQUE: Multidetector CT imaging of the abdomen and pelvis was performed using the standard protocol following bolus administration of intravenous contrast. CONTRAST:  172mL OMNIPAQUE IOHEXOL 300 MG/ML  SOLN COMPARISON:  03/19/2020 FINDINGS: Lower chest: The lung bases are clear of an acute process. No worrisome pulmonary nodules to suggest metastatic disease. Heart is normal in size. No pericardial effusion. Age advanced coronary artery and aortic calcifications are noted. Hepatobiliary: 3 or 4 small hepatic lesions are identified. These are worrisome for metastatic disease. The largest and most conspicuous nodule is in segment 6 measures 13 mm on image 29/3. The gallbladder is unremarkable. No intra or extrahepatic biliary dilatation. Pancreas: No mass, inflammation or ductal dilatation. Spleen: Normal size. No focal lesions. Adrenals/Urinary Tract: The adrenal glands are unremarkable. Moderate cortical irregularity involving both kidneys could be due to fetal lobulations or parenchymal scarring. There are small bilateral renal cysts. There is also an indeterminate 14.5 mm partially exophytic lesion involving the interpolar region of the right kidney anteriorly. On the prior CT scan this measured approximately 26 Hounsfield units measures 68 Hounsfield units on the postcontrast images and is worrisome for a small enhancing renal cell carcinoma. No significant collecting system abnormalities. The bladder is unremarkable. Stomach/Bowel: There is a large infiltrating soft tissue mass involving the body region of the stomach. This appears to be partially circumferential but is mainly  involving the lesser curvature wrapping around the inferior aspect of the stomach. Best size estimate is 8.2 x 6.3 cm. The duodenum, small bowel and colon are unremarkable. The terminal ileum and appendix are normal. No colonic lesions are identified. Vascular/Lymphatic: The aorta is normal in caliber. Scattered atherosclerotic calcifications. Moderate calcifications involving the iliac arteries and scattered branch vessel calcifications. The major venous structures are patent. Do not see any perigastric, gastrohepatic ligament or celiac axis lymphadenopathy. No retroperitoneal lymphadenopathy. Reproductive: The prostate and seminal vesicles are unremarkable. There is mild median lobe hypertrophy the prostate gland minimally impressing on the base the bladder. Other: No pelvic adenopathy. There is a very small amount of free pelvic fluid noted. No inguinal mass or inguinal adenopathy. Musculoskeletal: No significant bony findings. Few small scattered sclerotic lesions likely benign bone islands. IMPRESSION: 1. Large infiltrating soft tissue mass involving the body region of the stomach consistent with neoplasm. No adjacent lymphadenopathy. 2. Small hepatic lesions most consistent with metastatic disease. MRI or PET-CT may be helpful for more definitive evaluation/staging. 3. 14.5 mm indeterminate partially exophytic lesion involving the interpolar region of the right kidney anteriorly. This is suspicious for a small enhancing renal cell carcinoma. MRI abdomen  without and with contrast may be helpful for further evaluation. 4. Age advanced coronary artery and aortic calcifications. 5. Aortic atherosclerosis. Aortic Atherosclerosis (ICD10-I70.0). Electronically Signed   By: Marijo Sanes M.D.   On: 08/16/2020 05:25      Subjective: Denies black stool, feels ok.  Use translator. Daughter was also at bedside. Informed them plan for referral to oncology. Informed them results of CT scan.   Discharge  Exam: Vitals:   08/16/20 0518 08/16/20 1100  BP: 118/71 116/74  Pulse: 73 86  Resp: 18 16  Temp: 98.5 F (36.9 C) 98.2 F (36.8 C)  SpO2: 99% 96%     General: Pt is alert, awake, not in acute distress Cardiovascular: RRR, S1/S2 +, no rubs, no gallops Respiratory: CTA bilaterally, no wheezing, no rhonchi Abdominal: Soft, NT, ND, bowel sounds + Extremities: no edema, no cyanosis    The results of significant diagnostics from this hospitalization (including imaging, microbiology, ancillary and laboratory) are listed below for reference.     Microbiology: Recent Results (from the past 240 hour(s))  Resp Panel by RT-PCR (Flu A&B, Covid) Nasopharyngeal Swab     Status: None   Collection Time: 08/13/20  3:27 AM   Specimen: Nasopharyngeal Swab; Nasopharyngeal(NP) swabs in vial transport medium  Result Value Ref Range Status   SARS Coronavirus 2 by RT PCR NEGATIVE NEGATIVE Final    Comment: (NOTE) SARS-CoV-2 target nucleic acids are NOT DETECTED.  The SARS-CoV-2 RNA is generally detectable in upper respiratory specimens during the acute phase of infection. The lowest concentration of SARS-CoV-2 viral copies this assay can detect is 138 copies/mL. A negative result does not preclude SARS-Cov-2 infection and should not be used as the sole basis for treatment or other patient management decisions. A negative result may occur with  improper specimen collection/handling, submission of specimen other than nasopharyngeal swab, presence of viral mutation(s) within the areas targeted by this assay, and inadequate number of viral copies(<138 copies/mL). A negative result must be combined with clinical observations, patient history, and epidemiological information. The expected result is Negative.  Fact Sheet for Patients:  EntrepreneurPulse.com.au  Fact Sheet for Healthcare Providers:  IncredibleEmployment.be  This test is no t yet approved or  cleared by the [place] FDA and  has been authorized for detection and/or diagnosis of SARS-CoV-2 by FDA under an Emergency Use Authorization (EUA). This EUA will remain  in effect (meaning this test can be used) for the duration of the COVID-19 declaration under Section 564(b)(1) of the Act, 21 U.S.C.section 360bbb-3(b)(1), unless the authorization is terminated  or revoked sooner.       Influenza A by PCR NEGATIVE NEGATIVE Final   Influenza B by PCR NEGATIVE NEGATIVE Final    Comment: (NOTE) The Xpert Xpress SARS-CoV-2/FLU/RSV plus assay is intended as an aid in the diagnosis of influenza from Nasopharyngeal swab specimens and should not be used as a sole basis for treatment. Nasal washings and aspirates are unacceptable for Xpert Xpress SARS-CoV-2/FLU/RSV testing.  Fact Sheet for Patients: EntrepreneurPulse.com.au  Fact Sheet for Healthcare Providers: IncredibleEmployment.be  This test is not yet approved or cleared by the [place] FDA and has been authorized for detection and/or diagnosis of SARS-CoV-2 by FDA under an Emergency Use Authorization (EUA). This EUA will remain in effect (meaning this test can be used) for the duration of the COVID-19 declaration under Section 564(b)(1) of the Act, 21 U.S.C. section 360bbb-3(b)(1), unless the authorization is terminated or revoked.  Performed at [facility],  [place], [place] [address]   Surgical pcr screen     Status: None   Collection Time: 08/14/20 10:44 PM   Specimen: Nasal Mucosa; Nasal Swab  Result Value Ref Range Status   MRSA, PCR NEGATIVE NEGATIVE Final   Staphylococcus aureus NEGATIVE NEGATIVE Final    Comment: (NOTE) The Xpert SA Assay (FDA approved for NASAL specimens in patients 57 years of age and older), is one component of a comprehensive surveillance program. It is not intended to diagnose infection nor to guide or monitor  treatment. Performed at [facility], [place] [address], [place], [place] [address]      Labs: BNP (last 3 results) No results for input(s): BNP in the last 8760 hours. Basic Metabolic Panel: Recent Labs  Lab 08/13/20 0313 08/14/20 0508 08/16/20 0852  NA 135 138 140  K 3.9 4.2 3.9  CL 106 107 106  CO2 21* 24 23  GLUCOSE 112* 88 134*  BUN 24* 12 10  CREATININE 1.06 1.07 1.23  CALCIUM 8.5* 8.6* 9.1   Liver Function Tests: Recent Labs  Lab 08/13/20 0313  AST 16  ALT 16  ALKPHOS 58  BILITOT 0.5  PROT 5.9*  ALBUMIN 3.2*   No results for input(s): LIPASE, AMYLASE in the last 168 hours. No results for input(s): AMMONIA in the last 168 hours. CBC: Recent Labs  Lab 08/13/20 0313 08/13/20 0313 08/14/20 0125 08/14/20 0508 08/14/20 1704 08/15/20 0445 08/16/20 0852  WBC 6.7   < > 5.8 6.2 6.5 5.9 6.0  NEUTROABS 3.8  --   --   --   --   --   --   HGB 5.5*   < > 7.3* 7.6* 9.4* 9.5* 9.9*  HCT 19.1*   < > 23.7* 24.1* 30.1* 31.1* 32.3*  MCV 64.7*   < > 70.1* 69.9* 73.2* 73.5* 74.4*  PLT 347   < > 298 304 331 316 331   < > = values in this interval not displayed.   Cardiac Enzymes: No results for input(s): CKTOTAL, CKMB, CKMBINDEX, TROPONINI in the last 168 hours. BNP: Invalid input(s): POCBNP CBG: Recent Labs  Lab 08/15/20 1156 08/15/20 1636 08/15/20 2103 08/16/20 0651 08/16/20 1110  GLUCAP 85 94 103* 88 111*   D-Dimer No results for input(s): DDIMER in the last 72 hours. Hgb A1c No results for input(s): HGBA1C in the last 72 hours. Lipid Profile No results for input(s): CHOL, HDL, LDLCALC, TRIG, CHOLHDL, LDLDIRECT in the last 72 hours. Thyroid function studies No results for input(s): TSH, T4TOTAL, T3FREE, THYROIDAB in the last 72 hours.  Invalid input(s): FREET3 Anemia work up No results for input(s): VITAMINB12, FOLATE, FERRITIN, TIBC, IRON, RETICCTPCT in the last 72 hours. Urinalysis    Component Value Date/Time   COLORURINE YELLOW 03/18/2020  1711   APPEARANCEUR CLEAR 03/18/2020 1711   LABSPEC 1.011 03/18/2020 1711   PHURINE 5.0 03/18/2020 1711   GLUCOSEU >=500 (A) 03/18/2020 1711   HGBUR NEGATIVE 03/18/2020 1711   BILIRUBINUR NEGATIVE 03/18/2020 1711   KETONESUR NEGATIVE 03/18/2020 1711   PROTEINUR NEGATIVE 03/18/2020 1711   NITRITE NEGATIVE 03/18/2020 1711   LEUKOCYTESUR NEGATIVE 03/18/2020 1711   Sepsis Labs Invalid input(s): PROCALCITONIN,  WBC,  LACTICIDVEN Microbiology Recent Results (from the past 240 hour(s))  Resp Panel by RT-PCR (Flu A&B, Covid) Nasopharyngeal Swab     Status: None   Collection Time: 08/13/20  3:27 AM   Specimen: Nasopharyngeal Swab; Nasopharyngeal(NP) swabs in vial transport medium  Result  Value Ref Range Status   SARS Coronavirus 2 by RT PCR NEGATIVE NEGATIVE Final    Comment: (NOTE) SARS-CoV-2 target nucleic acids are NOT DETECTED.  The SARS-CoV-2 RNA is generally detectable in upper respiratory specimens during the acute phase of infection. The lowest concentration of SARS-CoV-2 viral copies this assay can detect is 138 copies/mL. A negative result does not preclude SARS-Cov-2 infection and should not be used as the sole basis for treatment or other patient management decisions. A negative result may occur with  improper specimen collection/handling, submission of specimen other than nasopharyngeal swab, presence of viral mutation(s) within the areas targeted by this assay, and inadequate number of viral copies(<138 copies/mL). A negative result must be combined with clinical observations, patient history, and epidemiological information. The expected result is Negative.  Fact Sheet for Patients:  EntrepreneurPulse.com.au  Fact Sheet for Healthcare Providers:  IncredibleEmployment.be  This test is no t yet approved or cleared by the [place] FDA and  has been authorized for detection and/or diagnosis of SARS-CoV-2 by FDA under an Emergency  Use Authorization (EUA). This EUA will remain  in effect (meaning this test can be used) for the duration of the COVID-19 declaration under Section 564(b)(1) of the Act, 21 U.S.C.section 360bbb-3(b)(1), unless the authorization is terminated  or revoked sooner.       Influenza A by PCR NEGATIVE NEGATIVE Final   Influenza B by PCR NEGATIVE NEGATIVE Final    Comment: (NOTE) The Xpert Xpress SARS-CoV-2/FLU/RSV plus assay is intended as an aid in the diagnosis of influenza from Nasopharyngeal swab specimens and should not be used as a sole basis for treatment. Nasal washings and aspirates are unacceptable for Xpert Xpress SARS-CoV-2/FLU/RSV testing.  Fact Sheet for Patients: EntrepreneurPulse.com.au  Fact Sheet for Healthcare Providers: IncredibleEmployment.be  This test is not yet approved or cleared by the [place] FDA and has been authorized for detection and/or diagnosis of SARS-CoV-2 by FDA under an Emergency Use Authorization (EUA). This EUA will remain in effect (meaning this test can be used) for the duration of the COVID-19 declaration under Section 564(b)(1) of the Act, 21 U.S.C. section 360bbb-3(b)(1), unless the authorization is terminated or revoked.  Performed at [facility], [address] [place], [place] [address]   Surgical pcr screen     Status: None   Collection Time: 08/14/20 10:44 PM   Specimen: Nasal Mucosa; Nasal Swab  Result Value Ref Range Status   MRSA, PCR NEGATIVE NEGATIVE Final   Staphylococcus aureus NEGATIVE NEGATIVE Final    Comment: (NOTE) The Xpert SA Assay (FDA approved for NASAL specimens in patients 54 years of age and older), is one component of a comprehensive surveillance program. It is not intended to diagnose infection nor to guide or monitor treatment. Performed at [facility], [place] [address], [place], [place] [address]      Time coordinating discharge: 40  minutes  SIGNED:   Elmarie Shiley, MD  [facility]

## 2020-08-17 ENCOUNTER — Encounter: Payer: Self-pay | Admitting: *Deleted

## 2020-08-17 LAB — SURGICAL PATHOLOGY

## 2020-08-17 NOTE — Progress Notes (Signed)
Reached out to Jimmy Blankenship to introduce myself as the office RN Navigator and explain our new patient process. Spoke to his daughter Jimmy Blankenship who speaks english. Reviewed the reason for their referral and scheduled their new patient appointment along with labs. Provided address and directions to the office including call back phone number.   Informed patient about my role as a navigator and that I will meet with them prior to their New Patient appointment and more fully discuss what services I can provide. At this time Jimmy Blankenship has no further questions or needs.   Jimmy Blankenship does request that an interpreter be at the appointment. Request also sent to scheduler.   Oncology Nurse Navigator Documentation  Oncology Nurse Navigator Flowsheets 08/17/2020  Abnormal Finding Date 08/13/2020  Diagnosis Status Additional Work Up  Navigator Follow Up Date: 08/22/2020  Navigator Follow Up Reason: New Patient Appointment  Navigator Location [facility]  Referral Date to RadOnc/MedOnc 08/16/2020  Navigator Encounter Type Introductory Phone Call  Patient Visit Type MedOnc  Treatment Phase Pre-Tx/Tx Discussion  Barriers/Navigation Needs Coordination of Care;Education;Language/Communication  Education Other  Interventions Coordination of Care;Education  Acuity Level 3-Moderate Needs (3-4 Barriers Identified)  Coordination of Care Appts  Education Method Verbal  Support Groups/Services Friends and Family  Time Spent with Patient 28

## 2020-08-22 ENCOUNTER — Inpatient Hospital Stay: Payer: Medicare Other | Attending: Hematology & Oncology

## 2020-08-22 ENCOUNTER — Telehealth: Payer: Self-pay

## 2020-08-22 ENCOUNTER — Inpatient Hospital Stay (HOSPITAL_BASED_OUTPATIENT_CLINIC_OR_DEPARTMENT_OTHER): Payer: Medicare Other | Admitting: Hematology & Oncology

## 2020-08-22 ENCOUNTER — Encounter: Payer: Self-pay | Admitting: Hematology & Oncology

## 2020-08-22 ENCOUNTER — Other Ambulatory Visit: Payer: Self-pay

## 2020-08-22 ENCOUNTER — Ambulatory Visit: Payer: Medicare Other | Admitting: Oncology

## 2020-08-22 VITALS — BP 108/71 | HR 88 | Temp 97.8°F | Resp 18 | Ht 67.0 in | Wt 143.0 lb

## 2020-08-22 DIAGNOSIS — I252 Old myocardial infarction: Secondary | ICD-10-CM

## 2020-08-22 DIAGNOSIS — Z87891 Personal history of nicotine dependence: Secondary | ICD-10-CM

## 2020-08-22 DIAGNOSIS — E119 Type 2 diabetes mellitus without complications: Secondary | ICD-10-CM

## 2020-08-22 DIAGNOSIS — R109 Unspecified abdominal pain: Secondary | ICD-10-CM | POA: Diagnosis not present

## 2020-08-22 DIAGNOSIS — K3189 Other diseases of stomach and duodenum: Secondary | ICD-10-CM

## 2020-08-22 DIAGNOSIS — I255 Ischemic cardiomyopathy: Secondary | ICD-10-CM | POA: Insufficient documentation

## 2020-08-22 DIAGNOSIS — R195 Other fecal abnormalities: Secondary | ICD-10-CM | POA: Insufficient documentation

## 2020-08-22 DIAGNOSIS — R1909 Other intra-abdominal and pelvic swelling, mass and lump: Secondary | ICD-10-CM

## 2020-08-22 DIAGNOSIS — Z79899 Other long term (current) drug therapy: Secondary | ICD-10-CM

## 2020-08-22 DIAGNOSIS — C799 Secondary malignant neoplasm of unspecified site: Secondary | ICD-10-CM

## 2020-08-22 DIAGNOSIS — R634 Abnormal weight loss: Secondary | ICD-10-CM | POA: Insufficient documentation

## 2020-08-22 DIAGNOSIS — C169 Malignant neoplasm of stomach, unspecified: Secondary | ICD-10-CM

## 2020-08-22 DIAGNOSIS — Z8249 Family history of ischemic heart disease and other diseases of the circulatory system: Secondary | ICD-10-CM

## 2020-08-22 DIAGNOSIS — M6250 Muscle wasting and atrophy, not elsewhere classified, unspecified site: Secondary | ICD-10-CM | POA: Insufficient documentation

## 2020-08-22 DIAGNOSIS — C787 Secondary malignant neoplasm of liver and intrahepatic bile duct: Secondary | ICD-10-CM | POA: Insufficient documentation

## 2020-08-22 DIAGNOSIS — I2510 Atherosclerotic heart disease of native coronary artery without angina pectoris: Secondary | ICD-10-CM | POA: Insufficient documentation

## 2020-08-22 DIAGNOSIS — K921 Melena: Secondary | ICD-10-CM | POA: Insufficient documentation

## 2020-08-22 DIAGNOSIS — I10 Essential (primary) hypertension: Secondary | ICD-10-CM | POA: Insufficient documentation

## 2020-08-22 DIAGNOSIS — C165 Malignant neoplasm of lesser curvature of stomach, unspecified: Secondary | ICD-10-CM | POA: Insufficient documentation

## 2020-08-22 DIAGNOSIS — Z803 Family history of malignant neoplasm of breast: Secondary | ICD-10-CM

## 2020-08-22 DIAGNOSIS — R002 Palpitations: Secondary | ICD-10-CM | POA: Insufficient documentation

## 2020-08-22 DIAGNOSIS — Z7189 Other specified counseling: Secondary | ICD-10-CM

## 2020-08-22 DIAGNOSIS — K922 Gastrointestinal hemorrhage, unspecified: Secondary | ICD-10-CM

## 2020-08-22 HISTORY — DX: Malignant neoplasm of lesser curvature of stomach, unspecified: C16.5

## 2020-08-22 HISTORY — DX: Other specified counseling: Z71.89

## 2020-08-22 HISTORY — DX: Malignant neoplasm of stomach, unspecified: C16.9

## 2020-08-22 HISTORY — DX: Secondary malignant neoplasm of unspecified site: C79.9

## 2020-08-22 LAB — CBC WITH DIFFERENTIAL (CANCER CENTER ONLY)
Abs Immature Granulocytes: 0.02 10*3/uL (ref 0.00–0.07)
Basophils Absolute: 0.1 10*3/uL (ref 0.0–0.1)
Basophils Relative: 1 %
Eosinophils Absolute: 0.2 10*3/uL (ref 0.0–0.5)
Eosinophils Relative: 4 %
HCT: 35.3 % — ABNORMAL LOW (ref 39.0–52.0)
Hemoglobin: 10.8 g/dL — ABNORMAL LOW (ref 13.0–17.0)
Immature Granulocytes: 0 %
Lymphocytes Relative: 17 %
Lymphs Abs: 1 10*3/uL (ref 0.7–4.0)
MCH: 23.8 pg — ABNORMAL LOW (ref 26.0–34.0)
MCHC: 30.6 g/dL (ref 30.0–36.0)
MCV: 77.9 fL — ABNORMAL LOW (ref 80.0–100.0)
Monocytes Absolute: 0.5 10*3/uL (ref 0.1–1.0)
Monocytes Relative: 8 %
Neutro Abs: 4.2 10*3/uL (ref 1.7–7.7)
Neutrophils Relative %: 70 %
Platelet Count: 306 10*3/uL (ref 150–400)
RBC: 4.53 MIL/uL (ref 4.22–5.81)
RDW: 27.7 % — ABNORMAL HIGH (ref 11.5–15.5)
WBC Count: 6 10*3/uL (ref 4.0–10.5)
nRBC: 0 % (ref 0.0–0.2)

## 2020-08-22 LAB — CMP (CANCER CENTER ONLY)
ALT: 9 U/L (ref 0–44)
AST: 12 U/L — ABNORMAL LOW (ref 15–41)
Albumin: 4 g/dL (ref 3.5–5.0)
Alkaline Phosphatase: 70 U/L (ref 38–126)
Anion gap: 9 (ref 5–15)
BUN: 23 mg/dL (ref 8–23)
CO2: 26 mmol/L (ref 22–32)
Calcium: 9.5 mg/dL (ref 8.9–10.3)
Chloride: 102 mmol/L (ref 98–111)
Creatinine: 1.09 mg/dL (ref 0.61–1.24)
GFR, Estimated: >60 mL/min (ref >60)
Glucose, Bld: 220 mg/dL — ABNORMAL HIGH (ref 70–99)
Potassium: 4.3 mmol/L (ref 3.5–5.1)
Sodium: 137 mmol/L (ref 135–145)
Total Bilirubin: 0.6 mg/dL (ref 0.3–1.2)
Total Protein: 6.4 g/dL — ABNORMAL LOW (ref 6.5–8.1)

## 2020-08-22 LAB — PREALBUMIN: Prealbumin: 20.5 mg/dL (ref 18–38)

## 2020-08-22 LAB — LACTATE DEHYDROGENASE: LDH: 123 U/L (ref 98–192)

## 2020-08-22 NOTE — Telephone Encounter (Signed)
[employer] requisition completed and faxed. dph

## 2020-08-22 NOTE — Progress Notes (Signed)
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 New Patient (Initial Visit)  : I have been bleeding.  HPI: Jimmy Blankenship is a very nice 69 year old Jimmy Blankenship. He does not speak [place]. His son-in-law was very good at interpreting.  Of note, back in July he had triple bypass surgery. He did well with this.  He was noted to have some heme positive stool at Jimmy time. He will underwent CT scans. CT scans did not show anything that was significant for malignancy. Jimmy stomach looked okay. Jimmy liver was unremarkable. Unfortunately it was a noncontrast scan.  Even had a CEA level which was normal at 3.1.  He was hospitalized recently because of GI bleeding. He was found to have a hemoglobin of 7.3. Jimmy MCV was 70. Platelet count 298. He received 3 units of blood. He received IV iron. At Jimmy time, his ferritin was 5 with an iron saturation of 4%.  He was seen by Dr. Fuller Plan of gastroenterology. Dr. Fuller Plan did upper and lower endoscopy. Jimmy colonoscopy was unremarkable. I think there were some polyps. Unfortunately, Jimmy upper endoscopy showed a large mass in Jimmy stomach this measured 3 x 5 cm. It is on Jimmy lesser curvature. Biopsies were taken. 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 Jimmy tumor has been sent off for molecular markers, including HER-2 and MSI/MMR.  He had a CT scan done in Jimmy hospital. This showed a mass in Jimmy stomach. This measures 8.2 x 6.3 cm. There was no obvious adenopathy. There were 3 or 4 small hepatic lesions. Jimmy largest measured 1.3 cm. Also noted was a lesion in Jimmy right kidney which was 1.5 cm. It was "worrisome" for a small renal cell carcinoma.  He is originally from [place]. He has been in [place] stays for 20 years. He stopped smoking 16 years ago. 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 He did smoke for about 30 years. He  smoked at most a pack a day.  There is a history of breast cancer in Jimmy family. I think there is a history of colon cancer in Jimmy family.  He has diabetes. He does have some heart issues. He had echocardiogram done after his heart surgery and September. His ejection fraction was 40-45%.  Currently, I was his performance status is ECOG 1-2.     Past Medical History:  Diagnosis Date  . CKD (chronic kidney disease)   . Coronary Artery Disease s/p CABG    hx of PCI in 2000s in [place] // s/p NSTEMI in 7/21 >> CABG  . Diabetes mellitus 2   . Heart failure with reduced ejection fraction    Ischemic CM // Echo 7/21 - EF 35 // Intraop TEE 7/21: EF 40-45 // Echocardiogram 9/21: apical AK, EF 40-45, Gr 2 DD, trace AI, mild MR, mild LAE, normal RVSF, no pericardial effusion  . High cholesterol   . Hypertension   . Iron deficiency anemia    Heme + stools (during admit for NSTEMI in 7/21)  . Palpitations    Event monitor 10/21: NSR, rare PVCs, rare 4 beats NSVT, rare brief non-sustained SVT  :  Past Surgical History:  Procedure Laterality Date  . BIOPSY  08/15/2020   Procedure: BIOPSY;  Surgeon: Ladene Artist, MD;  Location: [facility] ENDOSCOPY;  Service: Gastroenterology;;  . COLONOSCOPY     around 2013. Normal  per patient. Jimmy Blankenship  . COLONOSCOPY N/A 08/15/2020   Procedure: COLONOSCOPY;  Surgeon: Ladene Artist, MD;  Location: [facility];  Service: Gastroenterology;  Laterality: N/A;  . CORONARY ANGIOPLASTY WITH STENT PLACEMENT    . CORONARY ARTERY BYPASS GRAFT N/A 03/24/2020   Procedure: CORONARY ARTERY BYPASS GRAFTING (CABG), ON PUMP, TIMES THREE, USING LEFT INTERNAL MAMMARY ARTERY AND RIGHT ENDOSCOPICALLY HARVESTED GREATER SAPHENOUS VEIN;  Surgeon: Ivin Poot, MD;  Location: [facility];  Service: Open Heart Surgery;  Laterality: N/A;  . ESOPHAGOGASTRODUODENOSCOPY N/A 08/15/2020   Procedure: ESOPHAGOGASTRODUODENOSCOPY (EGD);  Surgeon: Ladene Artist, MD;  Location: [place];  Service:  Gastroenterology;  Laterality: N/A;  . LEFT HEART CATH AND CORONARY ANGIOGRAPHY N/A 03/17/2020   Procedure: LEFT HEART CATH AND CORONARY ANGIOGRAPHY;  Surgeon: Belva Crome, MD;  Location: [place] [facility];  Service: Cardiovascular;  Laterality: N/A;  . POLYPECTOMY  08/15/2020   Procedure: POLYPECTOMY;  Surgeon: Ladene Artist, MD;  Location: [facility] ENDOSCOPY;  Service: Gastroenterology;;  . TEE WITHOUT CARDIOVERSION N/A 03/24/2020   Procedure: TRANSESOPHAGEAL ECHOCARDIOGRAM (TEE);  Surgeon: [name], Collier Salina, MD;  Location: [facility];  Service: Open Heart Surgery;  Laterality: N/A;  :   Current Outpatient Medications:  .  atorvastatin (LIPITOR) 80 MG tablet, Take 1 tablet (80 mg total) by mouth daily., Disp: 90 tablet, Rfl: 3 .  clopidogrel (PLAVIX) 75 MG tablet, Take 1 tablet (75 mg total) by mouth daily., Disp: 30 tablet, Rfl: 0 .  Cyanocobalamin (VITAMIN B-12 PO), Take 1 tablet by mouth daily., Disp: , Rfl:  .  feeding supplement (ENSURE ENLIVE / ENSURE PLUS) LIQD, Take 237 mLs by mouth 2 (two) times daily between meals., Disp: 237 mL, Rfl: 12 .  gabapentin (NEURONTIN) 300 MG capsule, Take 300 mg by mouth daily., Disp: , Rfl:  .  metFORMIN (GLUCOPHAGE) 1000 MG tablet, Take by mouth., Disp: , Rfl:  .  metoprolol tartrate (LOPRESSOR) 50 MG tablet, Take 1 tablet (50 mg total) by mouth 2 (two) times daily., Disp: 180 tablet, Rfl: 1 .  pantoprazole (PROTONIX) 40 MG tablet, Take 1 tablet (40 mg total) by mouth 2 (two) times daily., Disp: 60 tablet, Rfl: 1:  :  No Known Allergies:  Family History  Problem Relation Age of Onset  . Atrial fibrillation Mother   . Congestive Heart Failure Mother   . Cancer Neg Hx   :  Social History   Socioeconomic History  . Marital status: Married    Spouse name: Not on file  . Number of children: Not on file  . Years of education: Not on file  . Highest education level: Not on file  Occupational History  . Occupation: Advertising account executive  Tobacco Use   . Smoking status: Former Smoker    Packs/day: 1.00    Years: 30.00    Pack years: 30.00    Quit date: 2006    Years since quitting: 15.9  . Smokeless tobacco: Never Used  . Tobacco comment: quit 2008  Vaping Use  . Vaping Use: Never used  Substance and Sexual Activity  . Alcohol use: Never  . Drug use: Never  . Sexual activity: Not on file  Other Topics Concern  . Not on file  Social History Narrative  . Not on file   Social Determinants of Health   Financial Resource Strain: Not on file  Food Insecurity: Not on file  Transportation Needs: Not on file  Physical Activity: Not on file  Stress: Not  on file  Social Connections: Not on file  Intimate Partner Violence: Not on file  :  Review of Systems  Constitutional: Positive for weight loss.  Eyes: Negative.   Respiratory: Negative.   Cardiovascular: Positive for palpitations.  Gastrointestinal: Positive for abdominal pain, blood in stool and melena.  Genitourinary: Negative.   Musculoskeletal: Negative.   Skin: Negative.   Neurological: Negative.   Endo/Heme/Allergies: Negative.   Psychiatric/Behavioral: Negative.      Exam:         This is a thin Jimmy Blankenship in no obvious distress. Vital signs show temperature of 97.8. Pulse 88. Blood pressure 108/71. Weight is 143 lbs. Head and neck exam shows no scleral icterus. There is no oral lesions. He has some temporal muscle wasting. He has no adenopathy in Jimmy neck or supraclavicular region. Thyroid is nonpalpable. Lungs are clear bilaterally. Cardiac exam regular rate and rhythm with no murmurs, rubs or bruits. Abdomen is soft. He has good bowel sounds. There is no fluid wave. There is no guarding or rebound tenderness. He has no palpable liver or spleen tip. Back exam shows no tenderness over Jimmy spine, ribs or hips. Extremities shows no clubbing, cyanosis or edema. Has some symmetric muscle atrophy in upper and lower extremities. He has good strength in his extremities.  Neurological exam is nonfocal. Skin exam shows no rashes, ecchymoses or petechia.    _0 @   Recent Labs    08/22/20 1035  WBC 6.0  HGB 10.8*  HCT 35.3*  PLT 306   Recent Labs    08/22/20 1035  NA 137  K 4.3  CL 102  CO2 26  GLUCOSE 220*  BUN 23  CREATININE 1.09  CALCIUM 9.5    Blood smear review: None  Pathology: See above    Assessment and Plan: Jimmy Blankenship is a very nice 69 year old Jimmy Blankenship. 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 Of note, going back to Jimmy CT scans he had back in July, his stomach looked fine. Jimmy liver looked fine.  I just hate that this is happening. He is very nice.  I do think we have to confirm that he does have disease in his liver. I would get an MRI of Jimmy liver so we can get a better look. I want to try to avoid biopsy was in Jimmy liver.  We are clearly going to have to wait Jimmy molecular markers to see if he does have a tumor that is HER-2 positive or if there is any had normalities then which we could utilize immunotherapy.  If he does have stage IV, I told he and his family that this is a cancer that can be treated but not cured. If he does not have any evidence of metastatic disease, then we would give neoadjuvant chemotherapy and try to get Jimmy tumor resected out.  I know that with metastatic disease now, Jimmy use of immunotherapy with chemotherapy is preferred.  I know he has Jimmy heart issues. If Jimmy tumor is HER-2 positive, I still think we can probably utilize Herceptin or one of Jimmy anti-HER-2 drugs.  I would think standard chemotherapy for him would be FOLFOX.  Our goal would be to try to shrink Jimmy primary in Jimmy stomach so he does not bleed.  He does have family over in [place]. He would like to see them at some point. We will certainly try to do treat and hopefully see him  respond so that he would be able to take a trip over to [place].  I spent over an hour with  them. Again his son-in-law is a great interpreter for [place].  We will see what Jimmy MRI shows.  I do not think we need to have a PET scan done as this really would not add to anything else than what we have already.  He will need to have a Port-A-Cath placed if we do chemotherapy.  We will try to get chemo started Jimmy last week in December. I really think is important that we try to get treatment started soon so that he does not end up back in Jimmy hospital with bleeding.  We will have to see what his iron studies look like. We can certainly give him iron with treatment.

## 2020-08-23 ENCOUNTER — Telehealth: Payer: Self-pay

## 2020-08-23 ENCOUNTER — Encounter: Payer: Self-pay | Admitting: *Deleted

## 2020-08-23 LAB — IRON AND TIBC
Iron: 74 ug/dL (ref 42–163)
Saturation Ratios: 23 % (ref 20–55)
TIBC: 329 ug/dL (ref 202–409)
UIBC: 254 ug/dL (ref 117–376)

## 2020-08-23 LAB — FERRITIN: Ferritin: 297 ng/mL (ref 24–336)

## 2020-08-23 LAB — CEA (IN HOUSE-CHCC): CEA (CHCC-In House): 2.51 ng/mL (ref 0.00–5.00)

## 2020-08-23 NOTE — Telephone Encounter (Signed)
No 08/22/20 LOS entered.... AOM

## 2020-08-23 NOTE — Telephone Encounter (Signed)
S/w Jimmy Blankenship and she is aware of the MRI appt at [facility] 12/17 and npo 4 hrs prior///// AOM

## 2020-08-23 NOTE — Progress Notes (Signed)
This navigator out of the office for patient's initial appointment. He needs and MRI which has been scheduled for 08/26/20 and molecular studies which have been sent. Will continue to follow for additional needs.   Oncology Nurse Navigator Documentation  Oncology Nurse Navigator Flowsheets 08/23/2020  Abnormal Finding Date -  Diagnosis Status -  Navigator Follow Up Date: 08/26/2020  Navigator Follow Up Reason: Scan Review  Navigator Location [facility]  Referral Date to RadOnc/MedOnc -  Navigator Encounter Type Appt/Treatment Plan Review  Patient Visit Type MedOnc  Treatment Phase Pre-Tx/Tx Discussion  Barriers/Navigation Needs Coordination of Care;Education;Language/Communication  Education -  Interventions None Required  Acuity Level 3-Moderate Needs (3-4 Barriers Identified)  Coordination of Care -  Education Method -  Support Groups/Services Friends and Family  Time Spent with Patient 58

## 2020-08-24 ENCOUNTER — Other Ambulatory Visit: Payer: Self-pay | Admitting: Family

## 2020-08-24 DIAGNOSIS — C799 Secondary malignant neoplasm of unspecified site: Secondary | ICD-10-CM

## 2020-08-26 ENCOUNTER — Ambulatory Visit (HOSPITAL_COMMUNITY)
Admission: RE | Admit: 2020-08-26 | Discharge: 2020-08-26 | Disposition: A | Discharge status: Home / Self Care | Payer: Medicare Other | Source: Ambulatory Visit | Attending: Hematology & Oncology | Admitting: Hematology & Oncology

## 2020-08-26 ENCOUNTER — Other Ambulatory Visit: Payer: Self-pay

## 2020-08-26 DIAGNOSIS — C169 Malignant neoplasm of stomach, unspecified: Secondary | ICD-10-CM | POA: Diagnosis present

## 2020-08-26 DIAGNOSIS — C799 Secondary malignant neoplasm of unspecified site: Secondary | ICD-10-CM | POA: Insufficient documentation

## 2020-08-26 IMAGING — MR MR ABDOMEN WO/W CM
19 series · 48 of 48 positions shown · IV contrast (with contrast)
Comparison: CT scan [DATE]

CLINICAL DATA: Gastric neoplasm. Evaluate hepatic metastatic
disease and right renal lesion.

EXAM:
MRI ABDOMEN WITHOUT AND WITH CONTRAST
TECHNIQUE: Multiplanar multisequence MR imaging of the abdomen was performed
both before and after the administration of intravenous contrast.
CONTRAST:  6mL GADAVIST GADOBUTROL 1 MMOL/ML IV SOLN

[Series 2: haste_cor_mbh · coronal · 6.0mm · 1.56mm/px · 1 of 36 slices shown]
[im 1/36]
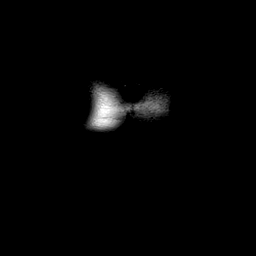

[Series 3: ax_trufi_mbh · axial · 6.0mm · 0.94mm/px · 1 of 47 slices shown]
[im 1/47]
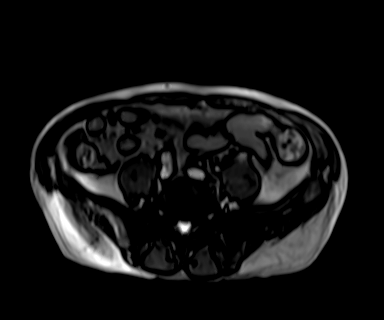

[Series 5: T2 fat-sat · axial · 6.0mm · 1.25mm/px · 1 of 40 slices shown]
[im 1/40]
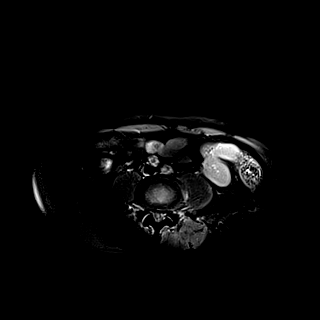

[Series 6: ax_diff_fb_tracew_dfc_mix · axial · 6.0mm · 1.36mm/px · z∈[-297,+41]mm · 3 of 96 slices shown]
[im 1/96]
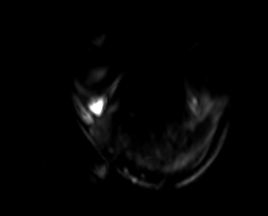
[im 48/96]
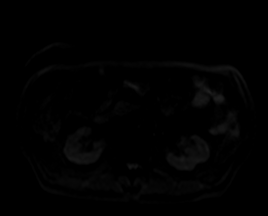
[im 96/96]
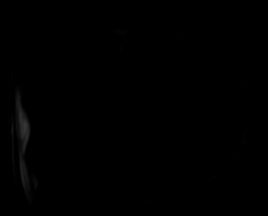

[Series 7: ax_diff_fb_adc_dfc_mix · axial · 6.0mm · 1.36mm/px · z∈[-297,+41]mm · 2 of 48 slices shown]
[im 1/48]
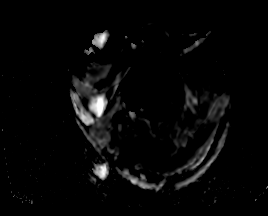
[im 48/48]
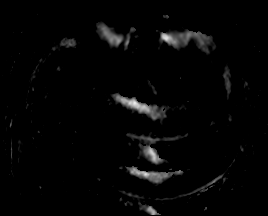

[Series 8: t1_vibe_e-dixon_tra_bh_pre_opp · axial · 3.0mm · 1.88mm/px · z∈[-270,+15]mm · 3 of 96 slices shown]
[im 1/96]
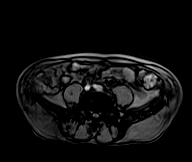
[im 48/96]
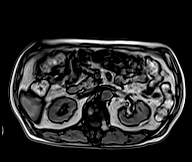
[im 96/96]
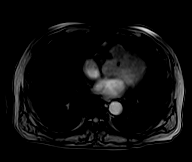

[Series 9: t1_vibe_e-dixon_tra_bh_pre_in · axial · 3.0mm · 1.88mm/px · z∈[-270,+15]mm · 3 of 96 slices shown]
[im 1/96]
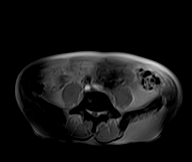
[im 48/96]
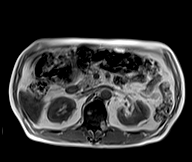
[im 96/96]
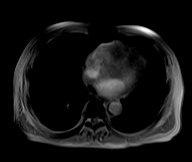

[Series 11: t1_vibe_e-dixon_tra_bh_pre_w · axial · 3.0mm · 1.88mm/px · z∈[-270,+15]mm · 3 of 96 slices shown]
[im 1/96]
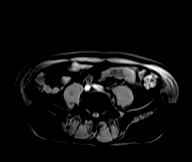
[im 48/96]
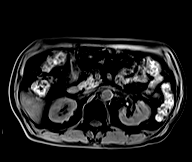
[im 96/96]
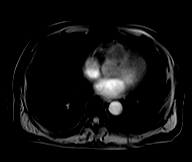

[Series 15: t1_vibe_dixon_tra_bh_arterial_w · axial · 3.0mm · 1.88mm/px · z∈[-270,+15]mm · 3 of 96 slices shown]
[im 1/96]
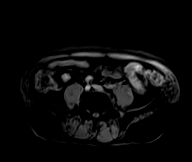
[im 48/96]
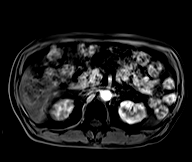
[im 96/96]
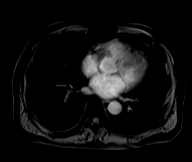

[Series 16: t1_vibe_dixon_tra_bh_venous_w_reg · axial · 3.0mm · 1.88mm/px · z∈[-270,+15]mm · 3 of 96 slices shown]
[im 1/96]
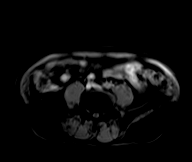
[im 48/96]
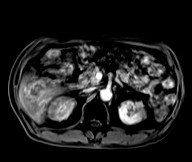
[im 96/96]
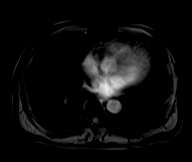

[Series 17: T2 · axial · 6.0mm · 1.56mm/px · 1 of 36 slices shown]
[im 1/36]
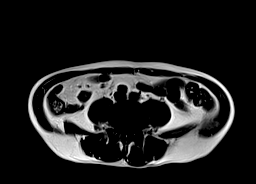

[Series 18: t1_vibe_dixon_tra_bh_delayed_w_reg · axial · 3.0mm · 1.88mm/px · z∈[-270,+15]mm · 3 of 96 slices shown]
[im 1/96]
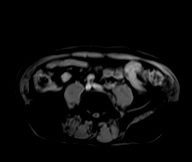
[im 48/96]
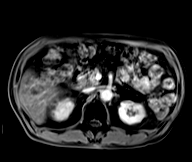
[im 96/96]
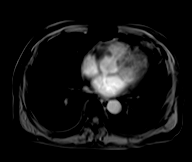

[Series 20: t1_vibe_dixon_cor_bh_post_w · coronal · 3.0mm · 2.34mm/px · 3 of 80 slices shown]
[im 1/80]
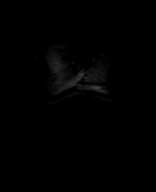
[im 40/80]
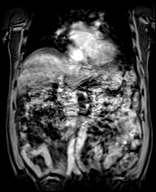
[im 80/80]
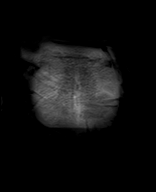

[Series 21: t1_vibe_dixon_tra_bh_3 min_w_reg · axial · 3.0mm · 1.88mm/px · z∈[-270,+15]mm · 3 of 96 slices shown]
[im 1/96]
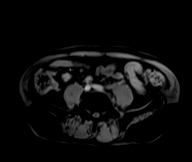
[im 48/96]
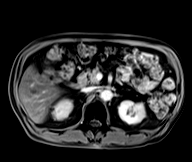
[im 96/96]
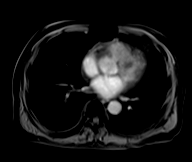

[Series 23: T1 dynamic · axial · 3.0mm · 1.25mm/px · z∈[-217,+20]mm · 3 of 80 slices shown]
[im 1/80]
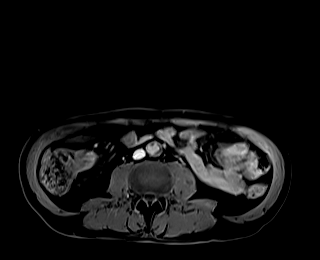
[im 40/80]
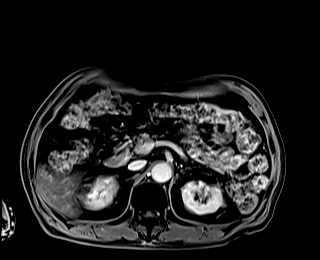
[im 80/80]
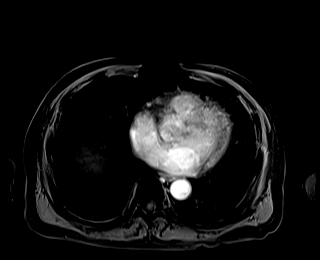

[Series 100: sub 20 sec · axial · 3.0mm · 1.88mm/px · z∈[-270,+15]mm · 3 of 96 slices shown]
[im 1/96]
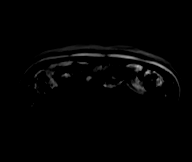
[im 48/96]
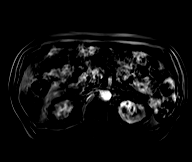
[im 96/96]
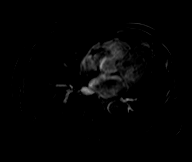

[Series 101: sub 45 sec · axial · 3.0mm · 1.88mm/px · z∈[-270,+15]mm · 3 of 96 slices shown]
[im 1/96]
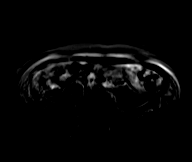
[im 48/96]
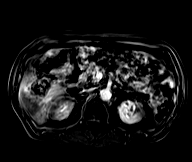
[im 96/96]
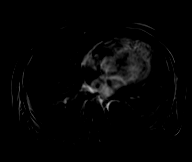

[Series 102: sub 90 sec · axial · 3.0mm · 1.88mm/px · z∈[-270,+15]mm · 3 of 96 slices shown]
[im 1/96]
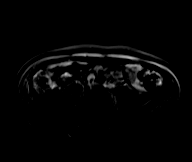
[im 48/96]
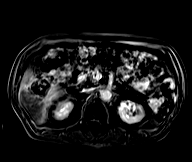
[im 96/96]
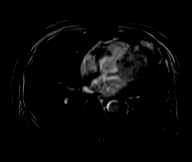

[Series 103: sub 3 min · axial · 3.0mm · 1.88mm/px · z∈[-270,+15]mm · 3 of 96 slices shown]
[im 1/96]
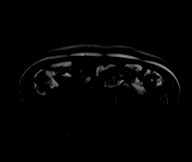
[im 48/96]
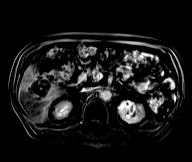
[im 96/96]
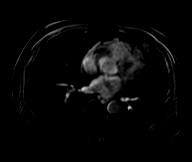

[48 of 48 positions shown; findings below may reference images not displayed]

FINDINGS: Examination is limited due to breathing motion artifact.

Lower chest: The lung bases are grossly clear. No worrisome
pulmonary lesions, pleural or pericardial effusion.

Hepatobiliary: As demonstrated on the CT scan there are multiple
metastatic hepatic lesions. These are diffusion positive. There are
difficult to see on the postcontrast images due to significant
breathing motion artifact. A few small scattered benign-appearing
hepatic cysts are also noted. The gallbladder is unremarkable. No
common bile duct dilatation.

Pancreas:  No obvious mass, inflammation or ductal dilatation.

Spleen:  Normal size.  No focal lesions.

Adrenals/Urinary Tract: Adrenal glands and kidneys are unremarkable.
The small exophytic lesion projecting off the anterior cortex of the
right kidney is a hyperdense/hemorrhagic cyst and not an enhancing
renal lesion. Several other renal cysts are noted. The largest cyst
in the right kidney does have some thin septations but no
enhancement.

Stomach/Bowel: Large gastric mass is again demonstrated and is
diffusion positive. No perigastric adenopathy.

Vascular/Lymphatic: The aorta and branch vessels are patent. No
mesenteric or retroperitoneal mass or adenopathy.

Other:  No ascites or abdominal wall hernia.

Musculoskeletal: No obvious bone lesions.
IMPRESSION: 1. Examination is limited due to breathing motion artifact.
2. Large gastric mass is again demonstrated and is diffusion
positive.
3. Numerous metastatic hepatic lesions.
4. No abdominal lymphadenopathy.
5. The right renal lesion in question on the CT scan is a
hyperdense/hemorrhagic cysts and not an enhancing renal neoplasm.

## 2020-08-26 MED ORDER — GADOBUTROL 1 MMOL/ML IV SOLN
6.0000 mL | Freq: Once | INTRAVENOUS | Status: AC | PRN
  Administered 2020-08-26: 6 mL via INTRAVENOUS

## 2020-08-30 ENCOUNTER — Encounter: Payer: Self-pay | Admitting: *Deleted

## 2020-08-30 NOTE — Progress Notes (Signed)
Reviewed MRI results with Dr Marin Olp. He would like the patient to be seen this week.  Called and spoke to the patient's daughter, Judson Roch. Reviewed appointment with her.   Oncology Nurse Navigator Documentation  Oncology Nurse Navigator Flowsheets 08/30/2020  Abnormal Finding Date -  Diagnosis Status -  Navigator Follow Up Date: 09/01/2020  Navigator Follow Up Reason: Follow-up Appointment  Navigator Location [facility]  Referral Date to RadOnc/MedOnc -  Navigator Encounter Type Scan Review;Appt/Treatment Plan Review  Patient Visit Type MedOnc  Treatment Phase Pre-Tx/Tx Discussion  Barriers/Navigation Needs Coordination of Care;Education;Language/Communication  Education Other  Interventions Coordination of Care  Acuity Level 3-Moderate Needs (3-4 Barriers Identified)  Coordination of Care Appts  Education Method Verbal  Support Groups/Services Friends and Family  Time Spent with Patient 30

## 2020-08-31 ENCOUNTER — Other Ambulatory Visit: Payer: Self-pay | Admitting: *Deleted

## 2020-08-31 DIAGNOSIS — C169 Malignant neoplasm of stomach, unspecified: Secondary | ICD-10-CM

## 2020-09-01 ENCOUNTER — Encounter: Payer: Self-pay | Admitting: Hematology & Oncology

## 2020-09-01 ENCOUNTER — Other Ambulatory Visit: Payer: Self-pay

## 2020-09-01 ENCOUNTER — Encounter: Payer: Self-pay | Admitting: *Deleted

## 2020-09-01 ENCOUNTER — Inpatient Hospital Stay: Payer: Medicare Other

## 2020-09-01 ENCOUNTER — Inpatient Hospital Stay (HOSPITAL_BASED_OUTPATIENT_CLINIC_OR_DEPARTMENT_OTHER): Payer: Medicare Other | Admitting: Hematology & Oncology

## 2020-09-01 VITALS — BP 116/81 | HR 81 | Temp 98.4°F | Resp 16

## 2020-09-01 DIAGNOSIS — C799 Secondary malignant neoplasm of unspecified site: Secondary | ICD-10-CM | POA: Diagnosis not present

## 2020-09-01 DIAGNOSIS — C165 Malignant neoplasm of lesser curvature of stomach, unspecified: Secondary | ICD-10-CM | POA: Diagnosis not present

## 2020-09-01 DIAGNOSIS — C169 Malignant neoplasm of stomach, unspecified: Secondary | ICD-10-CM | POA: Diagnosis not present

## 2020-09-01 LAB — CBC WITH DIFFERENTIAL (CANCER CENTER ONLY)
Abs Immature Granulocytes: 0.01 10*3/uL (ref 0.00–0.07)
Basophils Absolute: 0.1 10*3/uL (ref 0.0–0.1)
Basophils Relative: 1 %
Eosinophils Absolute: 0.2 10*3/uL (ref 0.0–0.5)
Eosinophils Relative: 3 %
HCT: 37.4 % — ABNORMAL LOW (ref 39.0–52.0)
Hemoglobin: 11.5 g/dL — ABNORMAL LOW (ref 13.0–17.0)
Immature Granulocytes: 0 %
Lymphocytes Relative: 18 %
Lymphs Abs: 1.3 10*3/uL (ref 0.7–4.0)
MCH: 24.3 pg — ABNORMAL LOW (ref 26.0–34.0)
MCHC: 30.7 g/dL (ref 30.0–36.0)
MCV: 78.9 fL — ABNORMAL LOW (ref 80.0–100.0)
Monocytes Absolute: 0.6 10*3/uL (ref 0.1–1.0)
Monocytes Relative: 8 %
Neutro Abs: 5 10*3/uL (ref 1.7–7.7)
Neutrophils Relative %: 70 %
Platelet Count: 299 10*3/uL (ref 150–400)
RBC: 4.74 MIL/uL (ref 4.22–5.81)
RDW: 28.1 % — ABNORMAL HIGH (ref 11.5–15.5)
WBC Count: 7.2 10*3/uL (ref 4.0–10.5)
nRBC: 0 % (ref 0.0–0.2)

## 2020-09-01 LAB — COMPREHENSIVE METABOLIC PANEL
ALT: 13 U/L (ref 0–44)
AST: 13 U/L — ABNORMAL LOW (ref 15–41)
Albumin: 4 g/dL (ref 3.5–5.0)
Alkaline Phosphatase: 62 U/L (ref 38–126)
Anion gap: 8 (ref 5–15)
BUN: 30 mg/dL — ABNORMAL HIGH (ref 8–23)
CO2: 28 mmol/L (ref 22–32)
Calcium: 10.1 mg/dL (ref 8.9–10.3)
Chloride: 101 mmol/L (ref 98–111)
Creatinine, Ser: 1.23 mg/dL (ref 0.61–1.24)
GFR, Estimated: >60 mL/min (ref >60)
Glucose, Bld: 189 mg/dL — ABNORMAL HIGH (ref 70–99)
Potassium: 5.2 mmol/L — ABNORMAL HIGH (ref 3.5–5.1)
Sodium: 137 mmol/L (ref 135–145)
Total Bilirubin: 0.4 mg/dL (ref 0.3–1.2)
Total Protein: 6.6 g/dL (ref 6.5–8.1)

## 2020-09-01 NOTE — Progress Notes (Signed)
START ON PATHWAY REGIMEN - Gastroesophageal     A cycle is every 14 days:     Nivolumab      Oxaliplatin      Leucovorin      Fluorouracil      Fluorouracil   **Always confirm dose/schedule in your pharmacy ordering system**  Patient Characteristics: Distant Metastases (cM1/pM1) / Locally Recurrent Disease, Adenocarcinoma - Esophageal, GE Junction, and Gastric, First Line, HER2 Negative/Unknown, PD?L1 Expression  PositiveCPS ? 5 Histology: Adenocarcinoma Disease Classification: Gastric Therapeutic Status: Distant Metastases (No Additional Staging) Line of Therapy: First Line HER2 Status: Awaiting Test Results PD-L1 Expression Status: PD-L1 Expression Positive CPS ? 5 Intent of Therapy: Non-Curative / Palliative Intent, Discussed with Patient

## 2020-09-01 NOTE — Progress Notes (Signed)
Pt. accompanied by son in law who interprets for him.

## 2020-09-01 NOTE — Progress Notes (Signed)
Patient will begin treatment after port placement. Appointments made for chemo class, port and initiation of treatment. IR called and made port placement appointment with patient's son.   Oncology Nurse Navigator Documentation  Oncology Nurse Navigator Flowsheets 09/01/2020  Abnormal Finding Date -  Diagnosis Status -  Planned Course of Treatment Chemotherapy  Phase of Treatment Chemo  Chemotherapy Pending- Reason: Surgeon or Oncologist Initiated  Navigator Follow Up Date: 09/13/2020  Navigator Follow Up Reason: Chemo Class  Navigator Location [facility]  Referral Date to RadOnc/MedOnc -  Navigator Encounter Type Follow-up Appt  Patient Visit Type MedOnc  Treatment Phase Pre-Tx/Tx Discussion  Barriers/Navigation Needs Coordination of Care;Education;Language/Communication  Education -  Interventions Coordination of Care  Acuity Level 3-Moderate Needs (3-4 Barriers Identified)  Coordination of Care Radiology  Education Method -  Support Groups/Services Friends and Family  Time Spent with Patient 30

## 2020-09-01 NOTE — Progress Notes (Signed)
Hematology and Oncology Follow Up Visit  Jimmy Blankenship [id number] 1951/07/24 69 y.o. 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  We first saw him back on 08/22/2020.  We did get a MRI of his abdomen.  This was to try to confirm that he has liver mets.  Unfortunately, the MRI did confirm that he has numerous liver metastasis.  Given that we have this diagnosis of stage IV gastric cancer, we now can discuss treatment.  Is still has a good performance status (ECOG 1) so he would be able to tolerate therapy.  We did send off molecular analysis.  His tumor is PD-L1 positive.  What is interesting right now is that the HER-2 was equivocal.  We will await the FISH ANALYSIS.  I know that the recent clinical trials show that immunotherapy along with chemotherapy improves survival with metastatic gastric cancer.  Given that information, I really think that Jimmy Blankenship would be able to tolerate FOLFOX/nivolumab.  If we are going to do this, he will definitely need to have a Port-A-Cath placed.  His son-in-law is a Theme park manager.  He was able to talk to Jimmy Blankenship and translate what I was saying.  Jimmy Blankenship is eating well.  He has had no problems with obvious bleeding.  When we first saw him, his iron studies looked okay.  His ferritin was 297 with iron saturation of 23%.  His CEA when we first saw him was 2.5.  Overall, I would say his performance status is ECOG 1.  Medications:  Current Outpatient Medications:  .  atorvastatin (LIPITOR) 80 MG tablet, Take 1 tablet (80 mg total) by mouth daily., Disp: 90 tablet, Rfl: 3 .  clopidogrel (PLAVIX) 75 MG tablet, Take 1 tablet (75 mg total) by mouth daily., Disp: 30 tablet, Rfl: 0 .  Cyanocobalamin (VITAMIN B-12 PO), Take 1 tablet by mouth  daily., Disp: , Rfl:  .  feeding supplement (ENSURE ENLIVE / ENSURE PLUS) LIQD, Take 237 mLs by mouth 2 (two) times daily between meals., Disp: 237 mL, Rfl: 12 .  gabapentin (NEURONTIN) 300 MG capsule, Take 300 mg by mouth daily., Disp: , Rfl:  .  metFORMIN (GLUCOPHAGE) 1000 MG tablet, Take by mouth., Disp: , Rfl:  .  metoprolol tartrate (LOPRESSOR) 50 MG tablet, Take 1 tablet (50 mg total) by mouth 2 (two) times daily., Disp: 180 tablet, Rfl: 1 .  pantoprazole (PROTONIX) 40 MG tablet, Take 1 tablet (40 mg total) by mouth 2 (two) times daily., Disp: 60 tablet, Rfl: 1  Allergies: No Known Allergies  Past Medical History, Surgical history, Social history, and Family History were reviewed and updated.  Review of Systems: Review of Systems  Constitutional: Negative.   HENT:  Negative.   Eyes: Negative.   Respiratory: Negative.   Cardiovascular: Negative.   Endocrine: Negative.   Genitourinary: Negative.    Musculoskeletal: Negative.   Skin: Negative.   Neurological: Negative.   Hematological: Negative.   Psychiatric/Behavioral: Negative.     Physical Exam:  oral temperature is 98.4 F (36.9 C). His blood pressure is 116/81 and his pulse is 81. His respiration is 16 and oxygen saturation is 99%.   Wt Readings from Last 3 Encounters:  08/22/20 143 lb (64.9 kg)  08/15/20 145 lb (65.8  kg)  08/12/20 143 lb 12.8 oz (65.2 kg)    Physical Exam Vitals reviewed.  HENT:     Head: Normocephalic and atraumatic.     Mouth/Throat:     Mouth: Oropharynx is clear and moist.  Eyes:     Extraocular Movements: EOM normal.     Pupils: Pupils are equal, round, and reactive to light.  Cardiovascular:     Rate and Rhythm: Normal rate and regular rhythm.     Heart sounds: Normal heart sounds.  Pulmonary:     Effort: Pulmonary effort is normal.     Breath sounds: Normal breath sounds.  Abdominal:     General: Bowel sounds are normal.     Palpations: Abdomen is soft.  Musculoskeletal:         General: No tenderness, deformity or edema. Normal range of motion.     Cervical back: Normal range of motion.  Lymphadenopathy:     Cervical: No cervical adenopathy.  Skin:    General: Skin is warm and dry.     Findings: No erythema or rash.  Neurological:     Mental Status: He is alert and oriented to person, place, and time.  Psychiatric:        Mood and Affect: Mood and affect normal.        Behavior: Behavior normal.        Thought Content: Thought content normal.        Judgment: Judgment normal.    Lab Results  Component Value Date   WBC 7.2 09/01/2020   HGB 11.5 (L) 09/01/2020   HCT 37.4 (L) 09/01/2020   MCV 78.9 (L) 09/01/2020   PLT 299 09/01/2020     Chemistry      Component Value Date/Time   NA 137 09/01/2020 1047   NA 140 06/10/2020 0935   K 5.2 (H) 09/01/2020 1047   CL 101 09/01/2020 1047   CO2 28 09/01/2020 1047   BUN 30 (H) 09/01/2020 1047   BUN 17 06/10/2020 0935   CREATININE 1.23 09/01/2020 1047   CREATININE 1.09 08/22/2020 1035      Component Value Date/Time   CALCIUM 10.1 09/01/2020 1047   ALKPHOS 62 09/01/2020 1047   AST 13 (L) 09/01/2020 1047   AST 12 (L) 08/22/2020 1035   ALT 13 09/01/2020 1047   ALT 9 08/22/2020 1035   BILITOT 0.4 09/01/2020 1047   BILITOT 0.6 08/22/2020 1035      Impression and Plan: Jimmy Blankenship is a very nice 69 year old [name] male.  He has metastatic adenocarcinoma of the stomach.  For right now, I think that our best treatment option will be systemic chemo immunotherapy.  I think that he could tolerate this.  I talked to Jimmy Blankenship about our protocol.  I think he would be a good candidate for FOLFOX/nivolumab.  I think this would be appropriate.  I think this would be effective.  He understands very well that what we are treating is not curable.  We can certainly treat this and cause regression which would help prolong his life.  Our goal here is preservation of quality of life.  Hopefully, the HER-2 mutation will be  positive.  If that is the case, then we will add Herceptin to the protocol.  Again he will need to have a Port-A-Cath placed.  I gave him and his family information about the chemotherapy and immunotherapy.  I went over some of the side effects.  Again, his son-in-law did a great job  in translating for [place].  We will try to get started the second week in January.  We need to get the Port-A-Cath placed.  Needs to have the chemotherapy teaching.  I really am confident that we should be able to help him and cause his tumor to regress.  I will plan to see him back for his second cycle of treatment.  I will plan for 4 cycles of treatment and then we will rescan.     Volanda Napoleon, MD 12/23/20214:56 PM

## 2020-09-07 ENCOUNTER — Other Ambulatory Visit: Payer: Medicare Other

## 2020-09-07 ENCOUNTER — Other Ambulatory Visit: Payer: Self-pay | Admitting: *Deleted

## 2020-09-07 DIAGNOSIS — C169 Malignant neoplasm of stomach, unspecified: Secondary | ICD-10-CM

## 2020-09-07 DIAGNOSIS — C165 Malignant neoplasm of lesser curvature of stomach, unspecified: Secondary | ICD-10-CM

## 2020-09-07 MED ORDER — DEXAMETHASONE 4 MG PO TABS: 8.0000 mg | ORAL_TABLET | Freq: Every day | ORAL | 1 refills | Status: DC

## 2020-09-07 MED ORDER — LIDOCAINE-PRILOCAINE 2.5-2.5 % EX CREA: TOPICAL_CREAM | CUTANEOUS | 3 refills | Status: DC

## 2020-09-07 MED ORDER — PROCHLORPERAZINE MALEATE 10 MG PO TABS: 10.0000 mg | ORAL_TABLET | Freq: Four times a day (QID) | ORAL | 1 refills | Status: DC | PRN

## 2020-09-07 MED ORDER — ONDANSETRON HCL 8 MG PO TABS: 8.0000 mg | ORAL_TABLET | Freq: Two times a day (BID) | ORAL | 1 refills | Status: DC | PRN

## 2020-09-12 ENCOUNTER — Other Ambulatory Visit: Payer: Self-pay | Admitting: Hematology & Oncology

## 2020-09-13 ENCOUNTER — Encounter: Payer: Self-pay | Admitting: *Deleted

## 2020-09-13 ENCOUNTER — Other Ambulatory Visit: Payer: Self-pay | Admitting: Hematology & Oncology

## 2020-09-13 ENCOUNTER — Inpatient Hospital Stay: Payer: Medicare Other | Attending: Hematology & Oncology

## 2020-09-13 ENCOUNTER — Other Ambulatory Visit: Payer: Self-pay

## 2020-09-13 DIAGNOSIS — C165 Malignant neoplasm of lesser curvature of stomach, unspecified: Secondary | ICD-10-CM | POA: Insufficient documentation

## 2020-09-13 DIAGNOSIS — Z5112 Encounter for antineoplastic immunotherapy: Secondary | ICD-10-CM | POA: Insufficient documentation

## 2020-09-13 DIAGNOSIS — C787 Secondary malignant neoplasm of liver and intrahepatic bile duct: Secondary | ICD-10-CM | POA: Insufficient documentation

## 2020-09-13 DIAGNOSIS — D500 Iron deficiency anemia secondary to blood loss (chronic): Secondary | ICD-10-CM | POA: Insufficient documentation

## 2020-09-13 DIAGNOSIS — K922 Gastrointestinal hemorrhage, unspecified: Secondary | ICD-10-CM | POA: Insufficient documentation

## 2020-09-13 DIAGNOSIS — Z79899 Other long term (current) drug therapy: Secondary | ICD-10-CM | POA: Insufficient documentation

## 2020-09-13 DIAGNOSIS — Z5111 Encounter for antineoplastic chemotherapy: Secondary | ICD-10-CM | POA: Insufficient documentation

## 2020-09-13 NOTE — Progress Notes (Signed)
Patient in chemotherapy education class with  Daughter Huntley Dec, and interpreter.  Discussed side effects of      Oxaliplatin, 5FU, Leucovorin, Opdivo and Herceptin            which include but are not limited to myelosuppression, decreased appetite, fatigue, fever, allergic or infusional reaction, mucositis, cardiac toxicity, cough, SOB, altered taste, nausea and vomiting, diarrhea, constipation, elevated LFTs myalgia and arthralgias, hair loss or thinning, rash, skin dryness, nail changes, peripheral neuropathy, discolored urine, delayed wound healing, mental changes (Chemo brain), increased risk of infections, weight loss.  Reviewed infusion room and office policy and procedure and phone numbers 24 hours x 7 days a week. Reviewed immunotherapy side effects Reviewed ambulatory pump specifics and how to manage safe handling at home.  Reviewed when to call the office with any concerns or problems.  Transport planner given.  Discussed portacath insertion and EMLA cream administration.  Antiemetic protocol and chemotherapy schedule reviewed. Patient verbalized understanding of chemotherapy indications and possible side effects.  Teachback done

## 2020-09-14 ENCOUNTER — Other Ambulatory Visit: Payer: Self-pay | Admitting: Radiology

## 2020-09-15 ENCOUNTER — Other Ambulatory Visit: Payer: Self-pay | Admitting: Hematology & Oncology

## 2020-09-16 ENCOUNTER — Other Ambulatory Visit: Payer: Self-pay | Admitting: Hematology & Oncology

## 2020-09-16 ENCOUNTER — Other Ambulatory Visit: Payer: Self-pay

## 2020-09-16 ENCOUNTER — Ambulatory Visit (HOSPITAL_COMMUNITY)
Admission: RE | Admit: 2020-09-16 | Discharge: 2020-09-16 | Disposition: A | Discharge status: Home / Self Care | Payer: Medicare Other | Source: Ambulatory Visit | Attending: Hematology & Oncology | Admitting: Hematology & Oncology

## 2020-09-16 ENCOUNTER — Encounter (HOSPITAL_COMMUNITY): Payer: Self-pay

## 2020-09-16 DIAGNOSIS — Z87891 Personal history of nicotine dependence: Secondary | ICD-10-CM | POA: Diagnosis not present

## 2020-09-16 DIAGNOSIS — Z7984 Long term (current) use of oral hypoglycemic drugs: Secondary | ICD-10-CM | POA: Diagnosis not present

## 2020-09-16 DIAGNOSIS — C169 Malignant neoplasm of stomach, unspecified: Secondary | ICD-10-CM

## 2020-09-16 DIAGNOSIS — Z7902 Long term (current) use of antithrombotics/antiplatelets: Secondary | ICD-10-CM | POA: Diagnosis not present

## 2020-09-16 DIAGNOSIS — Z79899 Other long term (current) drug therapy: Secondary | ICD-10-CM | POA: Diagnosis not present

## 2020-09-16 HISTORY — PX: IR IMAGING GUIDED PORT INSERTION: IMG5740

## 2020-09-16 LAB — BASIC METABOLIC PANEL
Anion gap: 11 (ref 5–15)
BUN: 19 mg/dL (ref 8–23)
CO2: 25 mmol/L (ref 22–32)
Calcium: 9.4 mg/dL (ref 8.9–10.3)
Chloride: 102 mmol/L (ref 98–111)
Creatinine, Ser: 0.95 mg/dL (ref 0.61–1.24)
GFR, Estimated: >60 mL/min (ref >60)
Glucose, Bld: 121 mg/dL — ABNORMAL HIGH (ref 70–99)
Potassium: 4 mmol/L (ref 3.5–5.1)
Sodium: 138 mmol/L (ref 135–145)

## 2020-09-16 LAB — PROTIME-INR
INR: 0.9 (ref 0.8–1.2)
Prothrombin Time: 12.1 seconds (ref 11.4–15.2)

## 2020-09-16 LAB — CBC WITH DIFFERENTIAL/PLATELET
Abs Immature Granulocytes: 0.02 10*3/uL (ref 0.00–0.07)
Basophils Absolute: 0.1 10*3/uL (ref 0.0–0.1)
Basophils Relative: 1 %
Eosinophils Absolute: 0.3 10*3/uL (ref 0.0–0.5)
Eosinophils Relative: 4 %
HCT: 36.4 % — ABNORMAL LOW (ref 39.0–52.0)
Hemoglobin: 11.5 g/dL — ABNORMAL LOW (ref 13.0–17.0)
Immature Granulocytes: 0 %
Lymphocytes Relative: 20 %
Lymphs Abs: 1.3 10*3/uL (ref 0.7–4.0)
MCH: 25.4 pg — ABNORMAL LOW (ref 26.0–34.0)
MCHC: 31.6 g/dL (ref 30.0–36.0)
MCV: 80.5 fL (ref 80.0–100.0)
Monocytes Absolute: 0.6 10*3/uL (ref 0.1–1.0)
Monocytes Relative: 9 %
Neutro Abs: 4.3 10*3/uL (ref 1.7–7.7)
Neutrophils Relative %: 66 %
Platelets: 217 10*3/uL (ref 150–400)
RBC: 4.52 MIL/uL (ref 4.22–5.81)
RDW: 27.1 % — ABNORMAL HIGH (ref 11.5–15.5)
WBC: 6.5 10*3/uL (ref 4.0–10.5)
nRBC: 0 % (ref 0.0–0.2)

## 2020-09-16 LAB — GLUCOSE, CAPILLARY: Glucose-Capillary: 131 mg/dL — ABNORMAL HIGH (ref 70–99)

## 2020-09-16 IMAGING — XA IR IMAGING GUIDED PORT INSERTION
2 series · 2 of 2 positions shown · non-contrast
Comparison: none

INDICATION: Gastric malignancy

[Series 1: (id) · 1 of 1 slices shown]
[im 1/1]
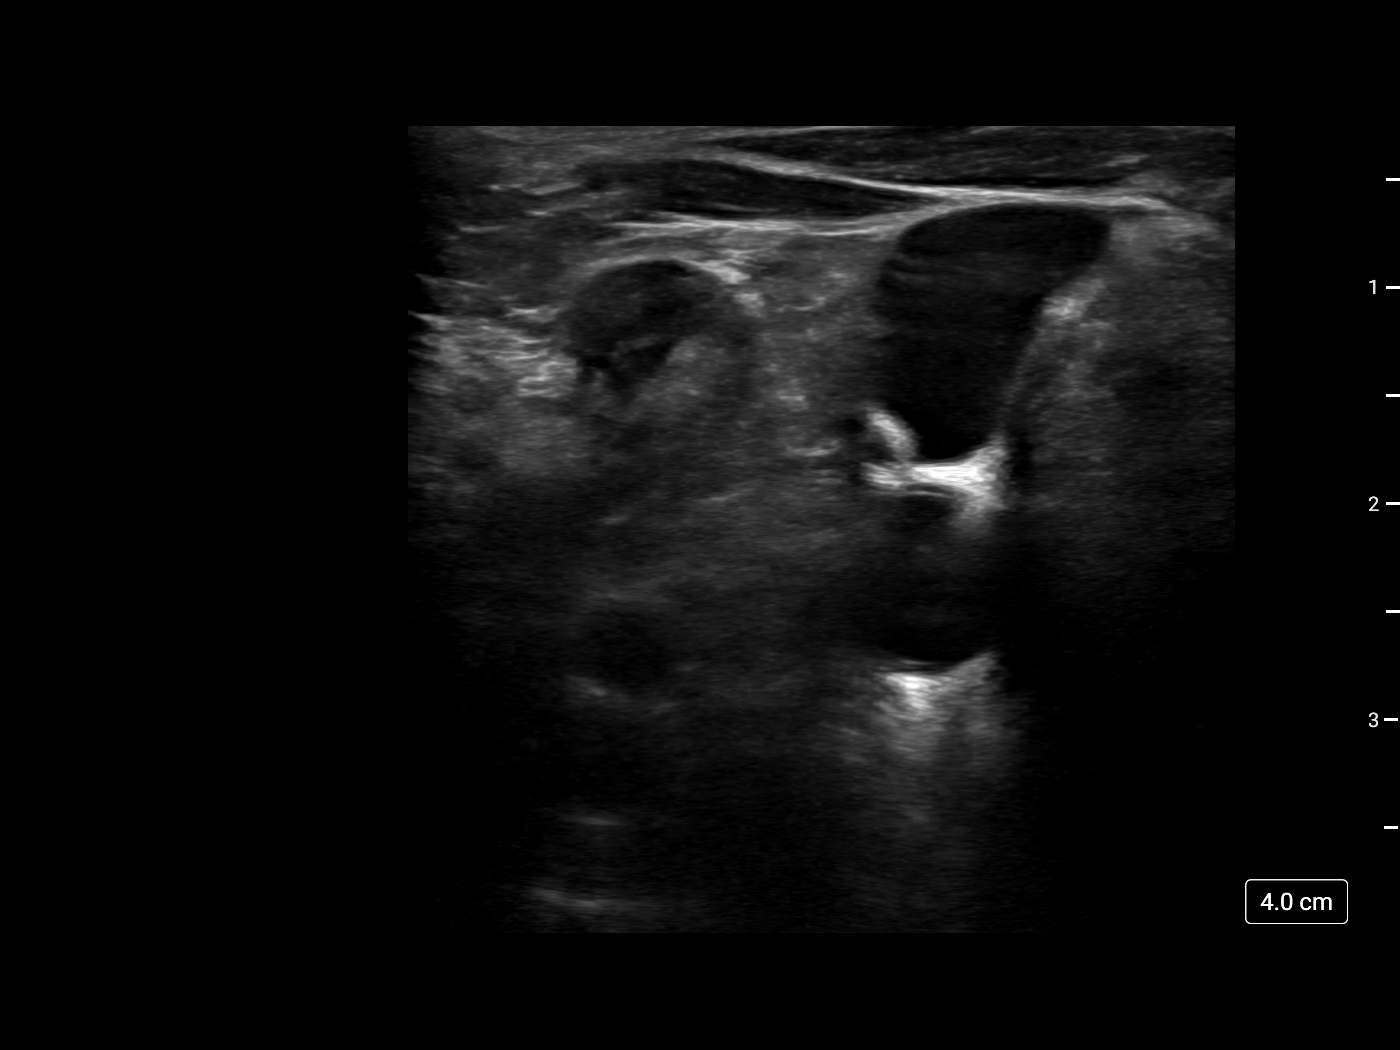

[Series 300: ir imaging guided port insertion · 1 of 1 slices shown]
[im 1/1]
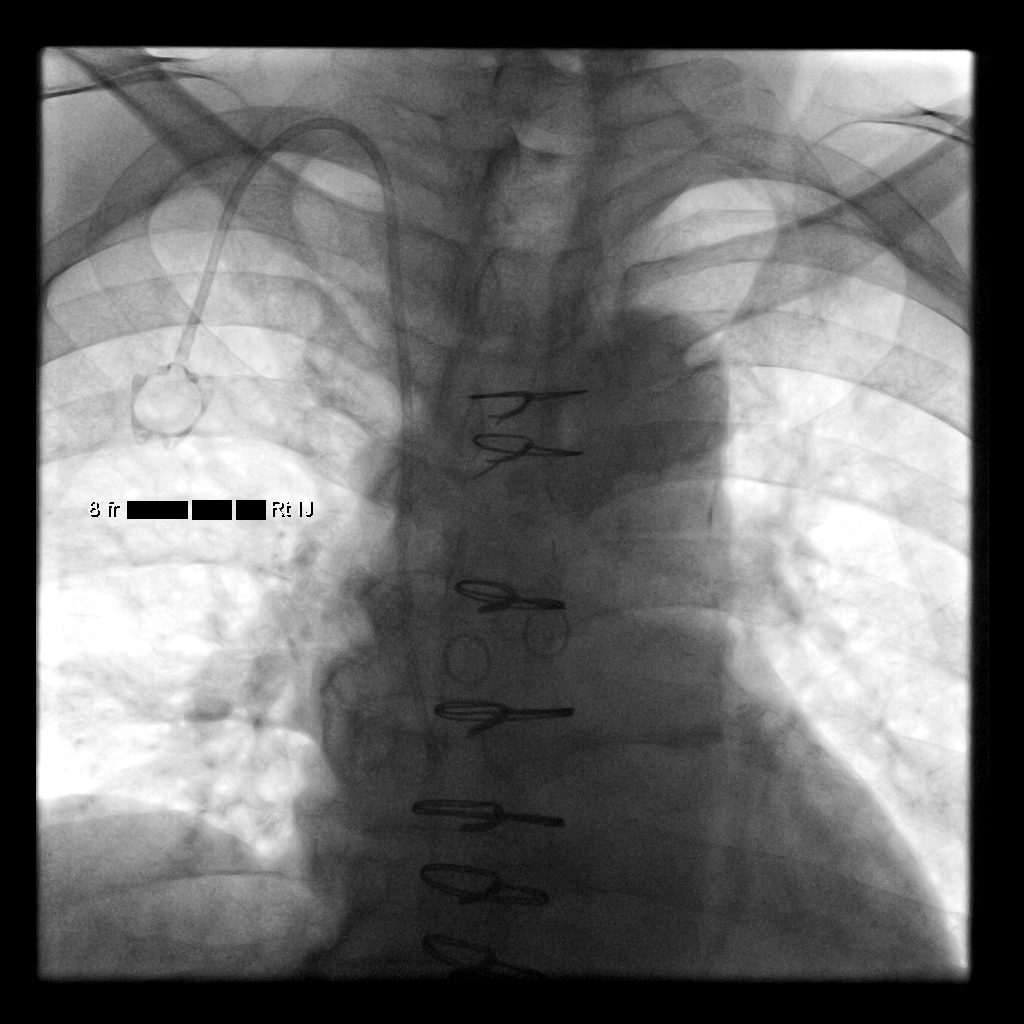

[2 of 2 positions shown; findings below may reference images not displayed]

EXAM:
IMPLANTED PORT A CATH PLACEMENT WITH ULTRASOUND AND FLUOROSCOPIC
GUIDANCE

MEDICATIONS:
Ancef 2 g IV; The antibiotic was administered within an appropriate
time interval prior to skin puncture.

ANESTHESIA/SEDATION:
Moderate (conscious) sedation was employed during this procedure. A
total of Versed 1.5 mg and Fentanyl 50 mcg was administered
intravenously.

Moderate Sedation Time: He 18 minutes. The patient's level of
consciousness and vital signs were monitored continuously by
radiology nursing throughout the procedure under my direct
supervision.

FLUOROSCOPY TIME:  0 minutes, 30 seconds (6 mGy)

COMPLICATIONS:
None immediate.

PROCEDURE:
The procedure, risks, benefits, and alternatives were explained to
the patient. Questions regarding the procedure were encouraged and
answered. The patient understands and consents to the procedure.

Patient positioned supine on the angiography table.

Right neck and anterior upper chest prepped and draped in the usual
sterile fashion. All elements of maximal sterile barrier were
utilized including, cap, mask, sterile gown, sterile gloves, large
sterile drape, hand scrubbing and 2% Chlorhexidine for skin
cleaning.

The right internal jugular vein was evaluated with ultrasound and
shown to be patent. A permanent ultrasound image was obtained and
placed in the patient's medical record. Using sterile gel and a
sterile probe cover, the right internal jugular vein was entered
with a 21 ga needle during real time ultrasound guidance.

0.018 inch guidewire placed and 21 ga needle exchanged for
transitional dilator set. Utilizing fluoroscopy, 0.035 inch
guidewire advanced through the needle without difficulty.

Attention then turned to the right anterior upper chest. Following
local lidocaine administration, a port pocket was created. The
catheter was connected to the port and brought from the pocket to
the venotomy site through a subcutaneous tunnel.

The catheter was cut to size and inserted through the peel-away
sheath. The catheter tip was positioned at the cavoatrial junction
using fluoroscopic guidance.

The port aspirated and flushed well. The port pocket was closed with
deep and superficial absorbable suture. The port pocket incision and
venotomy sites were also sealed with Dermabond.
IMPRESSION: Successful placement of a right internal jugular approach power
injectable Port-A-Cath. The catheter is ready for immediate use.

## 2020-09-16 MED ORDER — MIDAZOLAM HCL 2 MG/2ML IJ SOLN
INTRAMUSCULAR | Status: AC
  Filled 2020-09-16: qty 4

## 2020-09-16 MED ORDER — MIDAZOLAM HCL 2 MG/2ML IJ SOLN
INTRAMUSCULAR | Status: AC | PRN
  Administered 2020-09-16: 1 mg via INTRAVENOUS
  Administered 2020-09-16: 0.5 mg via INTRAVENOUS

## 2020-09-16 MED ORDER — CEFAZOLIN SODIUM-DEXTROSE 2-4 GM/100ML-% IV SOLN: 2.0000 g | INTRAVENOUS | Status: AC

## 2020-09-16 MED ORDER — SODIUM CHLORIDE 0.9 % IV SOLN: INTRAVENOUS | Status: DC

## 2020-09-16 MED ORDER — HEPARIN SOD (PORK) LOCK FLUSH 100 UNIT/ML IV SOLN
INTRAVENOUS | Status: AC | PRN
  Administered 2020-09-16: 500 [IU] via INTRAVENOUS

## 2020-09-16 MED ORDER — FENTANYL CITRATE (PF) 100 MCG/2ML IJ SOLN
INTRAMUSCULAR | Status: AC
  Filled 2020-09-16: qty 2

## 2020-09-16 MED ORDER — HEPARIN SOD (PORK) LOCK FLUSH 100 UNIT/ML IV SOLN
INTRAVENOUS | Status: AC
  Filled 2020-09-16: qty 5

## 2020-09-16 MED ORDER — LIDOCAINE HCL 1 % IJ SOLN
INTRAMUSCULAR | Status: AC
  Filled 2020-09-16: qty 20

## 2020-09-16 MED ORDER — CEFAZOLIN SODIUM-DEXTROSE 2-4 GM/100ML-% IV SOLN
INTRAVENOUS | Status: AC
  Administered 2020-09-16: 2 g via INTRAVENOUS
  Filled 2020-09-16: qty 100

## 2020-09-16 MED ORDER — LIDOCAINE HCL (PF) 1 % IJ SOLN
INTRAMUSCULAR | Status: AC | PRN
  Administered 2020-09-16 (×2): 10 mL via INTRADERMAL

## 2020-09-16 MED ORDER — FENTANYL CITRATE (PF) 100 MCG/2ML IJ SOLN
INTRAMUSCULAR | Status: AC | PRN
  Administered 2020-09-16: 50 ug via INTRAVENOUS

## 2020-09-16 NOTE — Progress Notes (Signed)
Internventional Radiology Preop and postop completed using interpreter Eduard Roux.

## 2020-09-16 NOTE — Consult Note (Signed)
Chief Complaint: Patient was seen in consultation today for port a cath placement  Referring Physician(s): Ennever,Peter R  Supervising Physician: Mir,Farhaan  Patient Status: [place]  History of Present Illness: Jimmy Blankenship is a 70 y.o. male with past medical history significant for heart disease with prior MI/CABG, diabetes, heart failure, hypercholesterolemia, hypertension, anemia and recently diagnosed stage IV gastric cancer.  He presents today for Port-A-Cath placement for chemotherapy.  Past Medical History:  Diagnosis Date  . 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[place]) 08/22/2020  . CKD (chronic kidney disease)   . Coronary Artery Disease s/p CABG    hx of PCI in 2000s in [place] // s/p NSTEMI in 7/21 >> CABG  . Diabetes mellitus 2   . Goals of care, counseling/discussion 08/22/2020  . Heart failure with reduced ejection fraction    Ischemic CM // Echo 7/21 - EF 35 // Intraop TEE 7/21: EF 40-45 // Echocardiogram 9/21: apical AK, EF 40-45, Gr 2 DD, trace AI, mild MR, mild LAE, normal RVSF, no pericardial effusion  . High cholesterol   . Hypertension   . Iron deficiency anemia    Heme + stools (during admit for NSTEMI in 7/21)  . 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 ([place]) 08/22/2020  . Palpitations    Event monitor 10/21: NSR, rare PVCs, rare 4 beats NSVT, rare brief non-sustained SVT    Past Surgical History:  Procedure Laterality Date  . BIOPSY  08/15/2020   Procedure: BIOPSY;  Surgeon: Ladene Artist, MD;  Location: [facility] ENDOSCOPY;  Service: Gastroenterology;;  . COLONOSCOPY     around 2013. Normal per patient. [place]  . COLONOSCOPY N/A 08/15/2020   Procedure: COLONOSCOPY;  Surgeon: Ladene Artist, MD;  Location: [place];  Service: Gastroenterology;  Laterality: N/A;  . CORONARY ANGIOPLASTY WITH STENT PLACEMENT    . CORONARY ARTERY BYPASS GRAFT N/A 03/24/2020   Procedure: CORONARY ARTERY BYPASS GRAFTING (CABG), ON PUMP, TIMES THREE, USING LEFT INTERNAL  MAMMARY ARTERY AND RIGHT ENDOSCOPICALLY HARVESTED GREATER SAPHENOUS VEIN;  Surgeon: Ivin Poot, MD;  Location: [place];  Service: Open Heart Surgery;  Laterality: N/A;  . ESOPHAGOGASTRODUODENOSCOPY N/A 08/15/2020   Procedure: ESOPHAGOGASTRODUODENOSCOPY (EGD);  Surgeon: Ladene Artist, MD;  Location: [place];  Service: Gastroenterology;  Laterality: N/A;  . LEFT HEART CATH AND CORONARY ANGIOGRAPHY N/A 03/17/2020   Procedure: LEFT HEART CATH AND CORONARY ANGIOGRAPHY;  Surgeon: Belva Crome, MD;  Location: [place] CV LAB;  Service: Cardiovascular;  Laterality: N/A;  . POLYPECTOMY  08/15/2020   Procedure: POLYPECTOMY;  Surgeon: Ladene Artist, MD;  Location[facility] ENDOSCOPY;  Service: Gastroenterology;;  . TEE WITHOUT CARDIOVERSION N/A 03/24/2020   Procedure: TRANSESOPHAGEAL ECHOCARDIOGRAM (TEE);  Surgeon: [name], Collier Salina, MD;  Location: [place];  Service: Open Heart Surgery;  Laterality: N/A;    Allergies: Patient has no known allergies.  Medications: Prior to Admission medications   Medication Sig Start Date End Date Taking? Authorizing Provider  atorvastatin (LIPITOR) 80 MG tablet Take 1 tablet (80 mg total) by mouth daily. 04/15/20   Richardson Dopp T, PA-C  clopidogrel (PLAVIX) 75 MG tablet Take 1 tablet (75 mg total) by mouth daily. 08/17/20 09/16/20  Regalado, Belkys A, MD  Cyanocobalamin (VITAMIN B-12 PO) Take 1 tablet by mouth daily.    [provider]  dexamethasone (DECADRON) 4 MG tablet Take 2 tablets (8 mg total) by mouth daily. Start the day after chemotherapy for 2 days. Take with food. 09/07/20   Burney Gauze  R, MD  feeding supplement (ENSURE ENLIVE / ENSURE PLUS) LIQD Take 237 mLs by mouth 2 (two) times daily between meals. 08/16/20   Regalado, Belkys A, MD  gabapentin (NEURONTIN) 300 MG capsule Take 300 mg by mouth daily.    [provider]  lidocaine-prilocaine (EMLA) cream Apply to affected area once 09/07/20   Volanda Napoleon, MD  metFORMIN (GLUCOPHAGE)  1000 MG tablet Take by mouth. 08/19/20 11/17/20  [provider]  metoprolol tartrate (LOPRESSOR) 50 MG tablet Take 1 tablet (50 mg total) by mouth 2 (two) times daily. 05/18/20   Richardson Dopp T, PA-C  ondansetron (ZOFRAN) 8 MG tablet Take 1 tablet (8 mg total) by mouth 2 (two) times daily as needed for refractory nausea / vomiting. Start on day 3 after chemotherapy. 09/07/20   Volanda Napoleon, MD  pantoprazole (PROTONIX) 40 MG tablet Take 1 tablet (40 mg total) by mouth 2 (two) times daily. 08/16/20   Regalado, Belkys A, MD  prochlorperazine (COMPAZINE) 10 MG tablet Take 1 tablet (10 mg total) by mouth every 6 (six) hours as needed (Nausea or vomiting). 09/07/20   Volanda Napoleon, MD     Family History  Problem Relation Age of Onset  . Atrial fibrillation Mother   . Congestive Heart Failure Mother   . Cancer Neg Hx     Social History   Socioeconomic History  . Marital status: Married    Spouse name: Not on file  . Number of children: Not on file  . Years of education: Not on file  . Highest education level: Not on file  Occupational History  . Occupation: Advertising account executive  Tobacco Use  . Smoking status: Former Smoker    Packs/day: 1.00    Years: 30.00    Pack years: 30.00    Quit date: 2006    Years since quitting: 16.0  . Smokeless tobacco: Never Used  . Tobacco comment: quit 2008  Vaping Use  . Vaping Use: Never used  Substance and Sexual Activity  . Alcohol use: Never  . Drug use: Never  . Sexual activity: Not on file  Other Topics Concern  . Not on file  Social History Narrative  . Not on file   Social Determinants of Health   Financial Resource Strain: Not on file  Food Insecurity: Not on file  Transportation Needs: Not on file  Physical Activity: Not on file  Stress: Not on file  Social Connections: Not on file      Review of Systems currently denies fever, headache, chest pain, dyspnea, cough, abdominal/back pain, nausea, vomiting or  bleeding  Vital Signs: Vitals:   09/16/20 1220  BP: 139/80  Pulse: 65  Resp: 16  Temp: 98.2 F (36.8 C)  SpO2: 100%      Physical Exam awake, alert.  Chest clear to auscultation bilaterally.  Heart with regular rate and rhythm.  Abdomen soft, positive bowel sounds, nontender.  No lower extremity edema.  Imaging: MR LIVER W WO CONTRAST  Result Date: 08/26/2020 CLINICAL DATA:  Gastric neoplasm. Evaluate hepatic metastatic disease and right renal lesion. EXAM: MRI ABDOMEN WITHOUT AND WITH CONTRAST TECHNIQUE: Multiplanar multisequence MR imaging of the abdomen was performed both before and after the administration of intravenous contrast. CONTRAST:  77mL GADAVIST GADOBUTROL 1 MMOL/ML IV SOLN COMPARISON:  CT scan 08/15/2020 FINDINGS: Examination is limited due to breathing motion artifact. Lower chest: The lung bases are grossly clear. No worrisome pulmonary lesions, pleural or pericardial effusion. Hepatobiliary: As  demonstrated on the CT scan there are multiple metastatic hepatic lesions. These are diffusion positive. There are difficult to see on the postcontrast images due to significant breathing motion artifact. A few small scattered benign-appearing hepatic cysts are also noted. The gallbladder is unremarkable. No common bile duct dilatation. Pancreas:  No obvious mass, inflammation or ductal dilatation. Spleen:  Normal size.  No focal lesions. Adrenals/Urinary Tract: Adrenal glands and kidneys are unremarkable. The small exophytic lesion projecting off the anterior cortex of the right kidney is a hyperdense/hemorrhagic cyst and not an enhancing renal lesion. Several other renal cysts are noted. The largest cyst in the right kidney does have some thin septations but no enhancement. Stomach/Bowel: Large gastric mass is again demonstrated and is diffusion positive. No perigastric adenopathy. Vascular/Lymphatic: The aorta and branch vessels are patent. No mesenteric or retroperitoneal mass or  adenopathy. Other:  No ascites or abdominal wall hernia. Musculoskeletal: No obvious bone lesions. IMPRESSION: 1. Examination is limited due to breathing motion artifact. 2. Large gastric mass is again demonstrated and is diffusion positive. 3. Numerous metastatic hepatic lesions. 4. No abdominal lymphadenopathy. 5. The right renal lesion in question on the CT scan is a hyperdense/hemorrhagic cysts and not an enhancing renal neoplasm. Electronically Signed   By: Marijo Sanes M.D.   On: 08/26/2020 16:17    Labs:  CBC: Recent Labs    08/15/20 0445 08/16/20 0852 08/22/20 1035 09/01/20 1047  WBC 5.9 6.0 6.0 7.2  HGB 9.5* 9.9* 10.8* 11.5*  HCT 31.1* 32.3* 35.3* 37.4*  PLT 316 331 306 299    COAGS: Recent Labs    03/19/20 0224 03/24/20 1408  INR 1.1 1.4*  APTT  --  32    BMP: Recent Labs    03/28/20 0241 03/29/20 0313 03/29/20 0313 04/15/20 1333 06/10/20 0935 08/13/20 0313 08/14/20 0508 08/16/20 0852 08/22/20 1035 09/01/20 1047  NA 138 137   < > 135 140   < > 138 140 137 137  K 3.6 4.2  --  5.1 5.1   < > 4.2 3.9 4.3 5.2*  CL 102 101  --  98 105   < > 107 106 102 101  CO2 28 27  --  22 22   < > 24 23 26 28   GLUCOSE 116* 102*   < > 110* 98   < > 88 134* 220* 189*  BUN 13 11   < > 17 17   < > 12 10 23  30*  CALCIUM 8.2* 8.4*  --  9.6 9.1   < > 8.6* 9.1 9.5 10.1  CREATININE 1.19 1.28*  --  1.08 1.06   < > 1.07 1.23 1.09 1.23  GFRNONAA >60 58*  --  71 72   < > >60 >60 >60 >60  GFRAA >60 >60  --  82 84  --   --   --   --   --    < > = values in this interval not displayed.    LIVER FUNCTION TESTS: Recent Labs    06/10/20 0935 08/13/20 0313 08/22/20 1035 09/01/20 1047  BILITOT 0.3 0.5 0.6 0.4  AST 15 16 12* 13*  ALT 17 16 9 13   ALKPHOS 98 58 70 62  PROT 6.1 5.9* 6.4* 6.6  ALBUMIN 3.8 3.2* 4.0 4.0    TUMOR MARKERS: No results for input(s): AFPTM, CEA, CA199, CHROMGRNA in the last 8760 hours.  Assessment and Plan: 70 y.o. male with past medical history  significant for heart  disease with prior MI/CABG, diabetes, heart failure, hypercholesterolemia, hypertension, anemia and recently diagnosed stage IV gastric cancer.  He presents today for Port-A-Cath placement for chemotherapy.Risks and benefits of image guided port-a-catheter placement was discussed with the patient via interpreter including, but not limited to bleeding, infection, pneumothorax, or fibrin sheath development and need for additional procedures.  All of the patient's questions were answered, patient is agreeable to proceed. Consent signed and in chart.      Thank you for this interesting consult.  I greatly enjoyed meeting Jimmy Blankenship and look forward to participating in their care.  A copy of this report was sent to the requesting provider on this date.  Electronically Signed: D. Rowe Robert, PA-C 09/16/2020, 12:30 PM   I spent a total of  25 minutes   in face to face in clinical consultation, greater than 50% of which was counseling/coordinating care for Port-A-Cath placement

## 2020-09-16 NOTE — Discharge Instructions (Signed)
You may remove your dressing in 24 hours and shower at this time. For problems and concerns, please call Interventional Radiology after hours at [phone].  Implanted Port Insertion, Care After This sheet gives you information about how to care for yourself after your procedure. Your health care provider may also give you more specific instructions. If you have problems or questions, contact your health care provider. What can I expect after the procedure? After the procedure, it is common to have:  Discomfort at the port insertion site.  Bruising on the skin over the port. This should improve over 3-4 days. Follow these instructions at home: [facility] care  After your port is placed, you will get a manufacturer's information card. The card has information about your port. Keep this card with you at all times.  Take care of the port as told by your health care provider. Ask your health care provider if you or a family member can get training for taking care of the port at home. A home health care nurse may also take care of the port.  Make sure to remember what type of port you have. Incision care      Follow instructions from your health care provider about how to take care of your port insertion site. Make sure you: ? Wash your hands with soap and water before and after you change your bandage (dressing). If soap and water are not available, use hand sanitizer. ? Change your dressing as told by your health care provider. ? Leave stitches (sutures), skin glue, or adhesive strips in place. These skin closures may need to stay in place for 2 weeks or longer. If adhesive strip edges start to loosen and curl up, you may trim the loose edges. Do not remove adhesive strips completely unless your health care provider tells you to do that.  Check your port insertion site every day for signs of infection. Check for: ? Redness, swelling, or pain. ? Fluid or blood. ? Warmth. ? Pus or a bad  smell. Activity  Return to your normal activities as told by your health care provider. Ask your health care provider what activities are safe for you.  Do not lift anything that is heavier than 10 lb (4.5 kg), or the limit that you are told, until your health care provider says that it is safe. General instructions  Take over-the-counter and prescription medicines only as told by your health care provider.  Do not take baths, swim, or use a hot tub until your health care provider approves. Ask your health care provider if you may take showers. You may only be allowed to take sponge baths.  Do not drive for 24 hours if you were given a sedative during your procedure.  Wear a medical alert bracelet in case of an emergency. This will tell any health care providers that you have a port.  Keep all follow-up visits as told by your health care provider. This is important. Contact a health care provider if:  You cannot flush your port with saline as directed, or you cannot draw blood from the port.  You have a fever or chills.  You have redness, swelling, or pain around your port insertion site.  You have fluid or blood coming from your port insertion site.  Your port insertion site feels warm to the touch.  You have pus or a bad smell coming from the port insertion site. Get help right away if:  You have chest pain or  shortness of breath.  You have bleeding from your port that you cannot control. Summary  Take care of the port as told by your health care provider. Keep the manufacturer's information card with you at all times.  Change your dressing as told by your health care provider.  Contact a health care provider if you have a fever or chills or if you have redness, swelling, or pain around your port insertion site.  Keep all follow-up visits as told by your health care provider. This information is not intended to replace advice given to you by your health care provider.  Make sure you discuss any questions you have with your health care provider. Document Revised: 03/25/2018 Document Reviewed: 03/25/2018 Elsevier Patient Education  [place].    Moderate Conscious Sedation, Adult, Care After These instructions provide you with information about caring for yourself after your procedure. Your health care provider may also give you more specific instructions. Your treatment has been planned according to current medical practices, but problems sometimes occur. Call your health care provider if you have any problems or questions after your procedure. What can I expect after the procedure? After your procedure, it is common:  To feel sleepy for several hours.  To feel clumsy and have poor balance for several hours.  To have poor judgment for several hours.  To vomit if you eat too soon. Follow these instructions at home: For at least 24 hours after the procedure:   Do not: ? Participate in activities where you could fall or become injured. ? Drive. ? Use heavy machinery. ? Drink alcohol. ? Take sleeping pills or medicines that cause drowsiness. ? Make important decisions or sign legal documents. ? Take care of children on your own.  Rest. Eating and drinking  Follow the diet recommended by your health care provider.  If you vomit: ? Drink water, juice, or soup when you can drink without vomiting. ? Make sure you have little or no nausea before eating solid foods. General instructions  Have a responsible adult stay with you until you are awake and alert.  Take over-the-counter and prescription medicines only as told by your health care provider.  If you smoke, do not smoke without supervision.  Keep all follow-up visits as told by your health care provider. This is important. Contact a health care provider if:  You keep feeling nauseous or you keep vomiting.  You feel light-headed.  You develop a rash.  You have a fever. Get  help right away if:  You have trouble breathing. This information is not intended to replace advice given to you by your health care provider. Make sure you discuss any questions you have with your health care provider. Document Revised: 08/09/2017 Document Reviewed: 12/17/2015 Elsevier Patient Education  2020 [employer].

## 2020-09-16 NOTE — Procedures (Signed)
Interventional Radiology Procedure Note  Procedure: Single Lumen Power Port Placement    Access:  Right internal jugular vein  Findings: Catheter tip positioned at cavoatrial junction. Port is ready for immediate use.   Complications: None  EBL: < 10 mL

## 2020-09-19 ENCOUNTER — Encounter: Payer: Medicare Other | Admitting: Gastroenterology

## 2020-09-19 ENCOUNTER — Inpatient Hospital Stay: Payer: Medicare Other

## 2020-09-19 ENCOUNTER — Other Ambulatory Visit: Payer: Self-pay

## 2020-09-19 DIAGNOSIS — C165 Malignant neoplasm of lesser curvature of stomach, unspecified: Secondary | ICD-10-CM | POA: Diagnosis present

## 2020-09-19 DIAGNOSIS — Z79899 Other long term (current) drug therapy: Secondary | ICD-10-CM | POA: Diagnosis not present

## 2020-09-19 DIAGNOSIS — D500 Iron deficiency anemia secondary to blood loss (chronic): Secondary | ICD-10-CM | POA: Diagnosis not present

## 2020-09-19 DIAGNOSIS — Z5111 Encounter for antineoplastic chemotherapy: Secondary | ICD-10-CM | POA: Diagnosis present

## 2020-09-19 DIAGNOSIS — C169 Malignant neoplasm of stomach, unspecified: Secondary | ICD-10-CM

## 2020-09-19 DIAGNOSIS — C787 Secondary malignant neoplasm of liver and intrahepatic bile duct: Secondary | ICD-10-CM | POA: Diagnosis not present

## 2020-09-19 DIAGNOSIS — Z5112 Encounter for antineoplastic immunotherapy: Secondary | ICD-10-CM | POA: Diagnosis not present

## 2020-09-19 DIAGNOSIS — K922 Gastrointestinal hemorrhage, unspecified: Secondary | ICD-10-CM | POA: Diagnosis not present

## 2020-09-19 DIAGNOSIS — C799 Secondary malignant neoplasm of unspecified site: Secondary | ICD-10-CM

## 2020-09-19 LAB — CMP (CANCER CENTER ONLY)
ALT: 12 U/L (ref 0–44)
AST: 12 U/L — ABNORMAL LOW (ref 15–41)
Albumin: 3.9 g/dL (ref 3.5–5.0)
Alkaline Phosphatase: 60 U/L (ref 38–126)
Anion gap: 7 (ref 5–15)
BUN: 23 mg/dL (ref 8–23)
CO2: 28 mmol/L (ref 22–32)
Calcium: 9.5 mg/dL (ref 8.9–10.3)
Chloride: 103 mmol/L (ref 98–111)
Creatinine: 0.96 mg/dL (ref 0.61–1.24)
GFR, Estimated: >60 mL/min (ref >60)
Glucose, Bld: 194 mg/dL — ABNORMAL HIGH (ref 70–99)
Potassium: 3.8 mmol/L (ref 3.5–5.1)
Sodium: 138 mmol/L (ref 135–145)
Total Bilirubin: 0.4 mg/dL (ref 0.3–1.2)
Total Protein: 6.2 g/dL — ABNORMAL LOW (ref 6.5–8.1)

## 2020-09-19 LAB — CBC WITH DIFFERENTIAL (CANCER CENTER ONLY)
Abs Immature Granulocytes: 0.04 10*3/uL (ref 0.00–0.07)
Basophils Absolute: 0.1 10*3/uL (ref 0.0–0.1)
Basophils Relative: 1 %
Eosinophils Absolute: 0.3 10*3/uL (ref 0.0–0.5)
Eosinophils Relative: 4 %
HCT: 34.1 % — ABNORMAL LOW (ref 39.0–52.0)
Hemoglobin: 10.8 g/dL — ABNORMAL LOW (ref 13.0–17.0)
Immature Granulocytes: 1 %
Lymphocytes Relative: 18 %
Lymphs Abs: 1.3 10*3/uL (ref 0.7–4.0)
MCH: 25.4 pg — ABNORMAL LOW (ref 26.0–34.0)
MCHC: 31.7 g/dL (ref 30.0–36.0)
MCV: 80 fL (ref 80.0–100.0)
Monocytes Absolute: 0.6 10*3/uL (ref 0.1–1.0)
Monocytes Relative: 8 %
Neutro Abs: 5 10*3/uL (ref 1.7–7.7)
Neutrophils Relative %: 68 %
Platelet Count: 203 10*3/uL (ref 150–400)
RBC: 4.26 MIL/uL (ref 4.22–5.81)
RDW: 26.2 % — ABNORMAL HIGH (ref 11.5–15.5)
WBC Count: 7.2 10*3/uL (ref 4.0–10.5)
nRBC: 0 % (ref 0.0–0.2)

## 2020-09-19 LAB — TSH: TSH: 0.846 u[IU]/mL (ref 0.320–4.118)

## 2020-09-19 MED ORDER — DIPHENHYDRAMINE HCL 25 MG PO CAPS
50.0000 mg | ORAL_CAPSULE | Freq: Once | ORAL | Status: AC
  Administered 2020-09-19: 50 mg via ORAL

## 2020-09-19 MED ORDER — DIPHENHYDRAMINE HCL 25 MG PO CAPS
ORAL_CAPSULE | ORAL | Status: AC
  Filled 2020-09-19: qty 2

## 2020-09-19 MED ORDER — SODIUM CHLORIDE 0.9 % IV SOLN
10.0000 mg | Freq: Once | INTRAVENOUS | Status: AC
  Administered 2020-09-19: 10 mg via INTRAVENOUS
  Filled 2020-09-19: qty 10

## 2020-09-19 MED ORDER — OXALIPLATIN CHEMO INJECTION 100 MG/20ML
85.0000 mg/m2 | Freq: Once | INTRAVENOUS | Status: AC
  Administered 2020-09-19: 150 mg via INTRAVENOUS
  Filled 2020-09-19: qty 20

## 2020-09-19 MED ORDER — SODIUM CHLORIDE 0.9 % IV SOLN
480.0000 mg | Freq: Once | INTRAVENOUS | Status: AC
  Administered 2020-09-19: 480 mg via INTRAVENOUS
  Filled 2020-09-19: qty 48

## 2020-09-19 MED ORDER — ACETAMINOPHEN 325 MG PO TABS
ORAL_TABLET | ORAL | Status: AC
  Filled 2020-09-19: qty 2

## 2020-09-19 MED ORDER — PALONOSETRON HCL INJECTION 0.25 MG/5ML
0.2500 mg | Freq: Once | INTRAVENOUS | Status: AC
  Administered 2020-09-19: 0.25 mg via INTRAVENOUS

## 2020-09-19 MED ORDER — FLUOROURACIL CHEMO INJECTION 2.5 GM/50ML
400.0000 mg/m2 | Freq: Once | INTRAVENOUS | Status: AC
  Administered 2020-09-19: 700 mg via INTRAVENOUS
  Filled 2020-09-19: qty 14

## 2020-09-19 MED ORDER — LEUCOVORIN CALCIUM INJECTION 350 MG
400.0000 mg/m2 | Freq: Once | INTRAVENOUS | Status: AC
  Administered 2020-09-19: 700 mg via INTRAVENOUS
  Filled 2020-09-19: qty 35

## 2020-09-19 MED ORDER — DEXTROSE 5 % IV SOLN
Freq: Once | INTRAVENOUS | Status: AC
  Filled 2020-09-19: qty 250

## 2020-09-19 MED ORDER — SODIUM CHLORIDE 0.9 % IV SOLN
2160.0000 mg/m2 | INTRAVENOUS | Status: DC
  Administered 2020-09-19: 3800 mg via INTRAVENOUS
  Filled 2020-09-19: qty 76

## 2020-09-19 MED ORDER — TRASTUZUMAB-DKST CHEMO 150 MG IV SOLR
4.0000 mg/kg | Freq: Once | INTRAVENOUS | Status: AC
  Administered 2020-09-19: 252 mg via INTRAVENOUS
  Filled 2020-09-19: qty 12

## 2020-09-19 MED ORDER — ACETAMINOPHEN 325 MG PO TABS
650.0000 mg | ORAL_TABLET | Freq: Once | ORAL | Status: AC
  Administered 2020-09-19: 650 mg via ORAL

## 2020-09-19 MED ORDER — PALONOSETRON HCL INJECTION 0.25 MG/5ML
INTRAVENOUS | Status: AC
  Filled 2020-09-19: qty 5

## 2020-09-19 MED ORDER — SODIUM CHLORIDE 0.9 % IV SOLN
Freq: Once | INTRAVENOUS | Status: AC
  Filled 2020-09-19: qty 250

## 2020-09-19 NOTE — Patient Instructions (Signed)
[facility] Discharge Instructions for Patients Receiving Chemotherapy  Today you received the following chemotherapy agents Nivolumab , oxaliplatin, leucovorin, 5FU  To help prevent nausea and vomiting after your treatment, we encourage you to take your nausea medication as directed   If you develop nausea and vomiting that is not controlled by your nausea medication, call the clinic.   BELOW ARE SYMPTOMS THAT SHOULD BE REPORTED IMMEDIATELY:  *FEVER GREATER THAN 100.5 F  *CHILLS WITH OR WITHOUT FEVER  NAUSEA AND VOMITING THAT IS NOT CONTROLLED WITH YOUR NAUSEA MEDICATION  *UNUSUAL SHORTNESS OF BREATH  *UNUSUAL BRUISING OR BLEEDING  TENDERNESS IN MOUTH AND THROAT WITH OR WITHOUT PRESENCE OF ULCERS  *URINARY PROBLEMS  *BOWEL PROBLEMS  UNUSUAL RASH Items with * indicate a potential emergency and should be followed up as soon as possible.  Feel free to call the clinic should you have any questions or concerns. The clinic phone number is [phone].  Please show the [facility] at check-in to the Emergency Department and triage nurse.

## 2020-09-19 NOTE — Patient Instructions (Addendum)
[facility] Discharge Instructions for Patients Receiving Chemotherapy  Today you received the following chemotherapy agents Oxaliplatin, 5FU, Leucovorin,   To help prevent nausea and vomiting after your treatment, we encourage you to take your nausea medication {  If you develop nausea and vomiting that is not controlled by your nausea medication, call the clinic.   BELOW ARE SYMPTOMS THAT SHOULD BE REPORTED IMMEDIATELY:  *FEVER GREATER THAN 100.5 F  *CHILLS WITH OR WITHOUT FEVER  NAUSEA AND VOMITING THAT IS NOT CONTROLLED WITH YOUR NAUSEA MEDICATION  *UNUSUAL SHORTNESS OF BREATH  *UNUSUAL BRUISING OR BLEEDING  TENDERNESS IN MOUTH AND THROAT WITH OR WITHOUT PRESENCE OF ULCERS  *URINARY PROBLEMS  *BOWEL PROBLEMS  UNUSUAL RASH Items with * indicate a potential emergency and should be followed up as soon as possible.  Feel free to call the clinic should you have any questions or concerns. The clinic phone number is [phone].  Please show the [place] at check-in to the Emergency Department and triage nurse.    Oxaliplatin Injection What is this medicine? OXALIPLATIN (ox AL i PLA tin) is a chemotherapy drug. It targets fast dividing cells, like cancer cells, and causes these cells to die. This medicine is used to treat cancers of the colon and rectum, and many other cancers. This medicine may be used for other purposes; ask your health care provider or pharmacist if you have questions. COMMON BRAND NAME(S): Eloxatin What should I tell my health care provider before I take this medicine? They need to know if you have any of these conditions:  heart disease  history of irregular heartbeat  liver disease  low blood counts, like white cells, platelets, or red blood cells  lung or breathing disease, like asthma  take medicines that treat or prevent blood clots  tingling of the fingers or toes, or other nerve disorder  an unusual or allergic  reaction to oxaliplatin, other chemotherapy, other medicines, foods, dyes, or preservatives  pregnant or trying to get pregnant  breast-feeding How should I use this medicine? This drug is given as an infusion into a vein. It is administered in a hospital or clinic by a specially trained health care professional. Talk to your pediatrician regarding the use of this medicine in children. Special care may be needed. Overdosage: If you think you have taken too much of this medicine contact a poison control center or emergency room at once. NOTE: This medicine is only for you. Do not share this medicine with others. What if I miss a dose? It is important not to miss a dose. Call your doctor or health care professional if you are unable to keep an appointment. What may interact with this medicine? Do not take this medicine with any of the following medications:  cisapride  dronedarone  pimozide  thioridazine This medicine may also interact with the following medications:  aspirin and aspirin-like medicines  certain medicines that treat or prevent blood clots like warfarin, apixaban, dabigatran, and rivaroxaban  cisplatin  cyclosporine  diuretics  medicines for infection like acyclovir, adefovir, amphotericin B, bacitracin, cidofovir, foscarnet, ganciclovir, gentamicin, pentamidine, vancomycin  NSAIDs, medicines for pain and inflammation, like ibuprofen or naproxen  other medicines that prolong the QT interval (an abnormal heart rhythm)  pamidronate  zoledronic acid This list may not describe all possible interactions. Give your health care provider a list of all the medicines, herbs, non-prescription drugs, or dietary supplements you use. Also tell them if you smoke, drink  alcohol, or use illegal drugs. Some items may interact with your medicine. What should I watch for while using this medicine? Your condition will be monitored carefully while you are receiving this  medicine. You may need blood work done while you are taking this medicine. This medicine may make you feel generally unwell. This is not uncommon as chemotherapy can affect healthy cells as well as cancer cells. Report any side effects. Continue your course of treatment even though you feel ill unless your healthcare professional tells you to stop. This medicine can make you more sensitive to cold. Do not drink cold drinks or use ice. Cover exposed skin before coming in contact with cold temperatures or cold objects. When out in cold weather wear warm clothing and cover your mouth and nose to warm the air that goes into your lungs. Tell your doctor if you get sensitive to the cold. Do not become pregnant while taking this medicine or for 9 months after stopping it. Women should inform their health care professional if they wish to become pregnant or think they might be pregnant. Men should not father a child while taking this medicine and for 6 months after stopping it. There is potential for serious side effects to an unborn child. Talk to your health care professional for more information. Do not breast-feed a child while taking this medicine or for 3 months after stopping it. This medicine has caused ovarian failure in some women. This medicine may make it more difficult to get pregnant. Talk to your health care professional if you are concerned about your fertility. This medicine has caused decreased sperm counts in some men. This may make it more difficult to father a child. Talk to your health care professional if you are concerned about your fertility. This medicine may increase your risk of getting an infection. Call your health care professional for advice if you get a fever, chills, or sore throat, or other symptoms of a cold or flu. Do not treat yourself. Try to avoid being around people who are sick. Avoid taking medicines that contain aspirin, acetaminophen, ibuprofen, naproxen, or ketoprofen  unless instructed by your health care professional. These medicines may hide a fever. Be careful brushing or flossing your teeth or using a toothpick because you may get an infection or bleed more easily. If you have any dental work done, tell your dentist you are receiving this medicine. What side effects may I notice from receiving this medicine? Side effects that you should report to your doctor or health care professional as soon as possible:  allergic reactions like skin rash, itching or hives, swelling of the face, lips, or tongue  breathing problems  cough  low blood counts - this medicine may decrease the number of white blood cells, red blood cells, and platelets. You may be at increased risk for infections and bleeding  nausea, vomiting  pain, redness, or irritation at site where injected  pain, tingling, numbness in the hands or feet  signs and symptoms of bleeding such as bloody or black, tarry stools; red or dark brown urine; spitting up blood or brown material that looks like coffee grounds; red spots on the skin; unusual bruising or bleeding from the eyes, gums, or nose  signs and symptoms of a dangerous change in heartbeat or heart rhythm like chest pain; dizziness; fast, irregular heartbeat; palpitations; feeling faint or lightheaded; falls  signs and symptoms of infection like fever; chills; cough; sore throat; pain or trouble passing urine  signs and symptoms of liver injury like dark yellow or brown urine; general ill feeling or flu-like symptoms; light-colored stools; loss of appetite; nausea; right upper belly pain; unusually weak or tired; yellowing of the eyes or skin  signs and symptoms of low red blood cells or anemia such as unusually weak or tired; feeling faint or lightheaded; falls  signs and symptoms of muscle injury like dark urine; trouble passing urine or change in the amount of urine; unusually weak or tired; muscle pain; back pain Side effects that  usually do not require medical attention (report to your doctor or health care professional if they continue or are bothersome):  changes in taste  diarrhea  gas  hair loss  loss of appetite  mouth sores This list may not describe all possible side effects. Call your doctor for medical advice about side effects. You may report side effects to FDA at 1-800-FDA-1088. Where should I keep my medicine? This drug is given in a hospital or clinic and will not be stored at home. NOTE: This sheet is a summary. It may not cover all possible information. If you have questions about this medicine, talk to your doctor, pharmacist, or health care provider.  2021 [employer] Standard (2019-01-14 12:20:35)  Leucovorin injection What is this medicine? LEUCOVORIN (loo koe VOR in) is used to prevent or treat the harmful effects of some medicines. This medicine is used to treat anemia caused by a low amount of folic acid in the body. It is also used with 5-fluorouracil (5-FU) to treat colon cancer. This medicine may be used for other purposes; ask your health care provider or pharmacist if you have questions. What should I tell my health care provider before I take this medicine? They need to know if you have any of these conditions:  anemia from low levels of vitamin B-12 in the blood  an unusual or allergic reaction to leucovorin, folic acid, other medicines, foods, dyes, or preservatives  pregnant or trying to get pregnant  breast-feeding How should I use this medicine? This medicine is for injection into a muscle or into a vein. It is given by a health care professional in a hospital or clinic setting. Talk to your pediatrician regarding the use of this medicine in children. Special care may be needed. Overdosage: If you think you have taken too much of this medicine contact a poison control center or emergency room at once. NOTE: This medicine is only for you. Do not share this medicine with  others. What if I miss a dose? This does not apply. What may interact with this medicine?  capecitabine  fluorouracil  phenobarbital  phenytoin  primidone  trimethoprim-sulfamethoxazole This list may not describe all possible interactions. Give your health care provider a list of all the medicines, herbs, non-prescription drugs, or dietary supplements you use. Also tell them if you smoke, drink alcohol, or use illegal drugs. Some items may interact with your medicine. What should I watch for while using this medicine? Your condition will be monitored carefully while you are receiving this medicine. This medicine may increase the side effects of 5-fluorouracil, 5-FU. Tell your doctor or health care professional if you have diarrhea or mouth sores that do not get better or that get worse. What side effects may I notice from receiving this medicine? Side effects that you should report to your doctor or health care professional as soon as possible:  allergic reactions like skin rash, itching or hives, swelling of the face,  lips, or tongue  breathing problems  fever, infection  mouth sores  unusual bleeding or bruising  unusually weak or tired Side effects that usually do not require medical attention (report to your doctor or health care professional if they continue or are bothersome):  constipation or diarrhea  loss of appetite  nausea, vomiting This list may not describe all possible side effects. Call your doctor for medical advice about side effects. You may report side effects to FDA at 1-800-FDA-1088. Where should I keep my medicine? This drug is given in a hospital or clinic and will not be stored at home. NOTE: This sheet is a summary. It may not cover all possible information. If you have questions about this medicine, talk to your doctor, pharmacist, or health care provider.  2021 [employer]/Gold Standard Fluorouracil, 5-FU injection What is this  medicine? FLUOROURACIL, 5-FU (flure oh YOOR a sil) is a chemotherapy drug. It slows the growth of cancer cells. This medicine is used to treat many types of cancer like breast cancer, colon or rectal cancer, pancreatic cancer, and stomach cancer. This medicine may be used for other purposes; ask your health care provider or pharmacist if you have questions. COMMON BRAND NAME(S): Adrucil What should I tell my health care provider before I take this medicine? They need to know if you have any of these conditions:  blood disorders  dihydropyrimidine dehydrogenase (DPD) deficiency  infection (especially a virus infection such as chickenpox, cold sores, or herpes)  kidney disease  liver disease  malnourished, poor nutrition  recent or ongoing radiation therapy  an unusual or allergic reaction to fluorouracil, other chemotherapy, other medicines, foods, dyes, or preservatives  pregnant or trying to get pregnant  breast-feeding How should I use this medicine? This drug is given as an infusion or injection into a vein. It is administered in a hospital or clinic by a specially trained health care professional. Talk to your pediatrician regarding the use of this medicine in children. Special care may be needed. Overdosage: If you think you have taken too much of this medicine contact a poison control center or emergency room at once. NOTE: This medicine is only for you. Do not share this medicine with others. What if I miss a dose? It is important not to miss your dose. Call your doctor or health care professional if you are unable to keep an appointment. What may interact with this medicine? Do not take this medicine with any of the following medications:  live virus vaccines This medicine may also interact with the following medications:  medicines that treat or prevent blood clots like warfarin, enoxaparin, and dalteparin This list may not describe all possible interactions. Give  your health care provider a list of all the medicines, herbs, non-prescription drugs, or dietary supplements you use. Also tell them if you smoke, drink alcohol, or use illegal drugs. Some items may interact with your medicine. What should I watch for while using this medicine? Visit your doctor for checks on your progress. This drug may make you feel generally unwell. This is not uncommon, as chemotherapy can affect healthy cells as well as cancer cells. Report any side effects. Continue your course of treatment even though you feel ill unless your doctor tells you to stop. In some cases, you may be given additional medicines to help with side effects. Follow all directions for their use. Call your doctor or health care professional for advice if you get a fever, chills or sore throat, or  other symptoms of a cold or flu. Do not treat yourself. This drug decreases your body's ability to fight infections. Try to avoid being around people who are sick. This medicine may increase your risk to bruise or bleed. Call your doctor or health care professional if you notice any unusual bleeding. Be careful brushing and flossing your teeth or using a toothpick because you may get an infection or bleed more easily. If you have any dental work done, tell your dentist you are receiving this medicine. Avoid taking products that contain aspirin, acetaminophen, ibuprofen, naproxen, or ketoprofen unless instructed by your doctor. These medicines may hide a fever. Do not become pregnant while taking this medicine. Women should inform their doctor if they wish to become pregnant or think they might be pregnant. There is a potential for serious side effects to an unborn child. Talk to your health care professional or pharmacist for more information. Do not breast-feed an infant while taking this medicine. Men should inform their doctor if they wish to father a child. This medicine may lower sperm counts. Do not treat diarrhea  with over the counter products. Contact your doctor if you have diarrhea that lasts more than 2 days or if it is severe and watery. This medicine can make you more sensitive to the sun. Keep out of the sun. If you cannot avoid being in the sun, wear protective clothing and use sunscreen. Do not use sun lamps or tanning beds/booths. What side effects may I notice from receiving this medicine? Side effects that you should report to your doctor or health care professional as soon as possible:  allergic reactions like skin rash, itching or hives, swelling of the face, lips, or tongue  low blood counts - this medicine may decrease the number of white blood cells, red blood cells and platelets. You may be at increased risk for infections and bleeding.  signs of infection - fever or chills, cough, sore throat, pain or difficulty passing urine  signs of decreased platelets or bleeding - bruising, pinpoint red spots on the skin, black, tarry stools, blood in the urine  signs of decreased red blood cells - unusually weak or tired, fainting spells, lightheadedness  breathing problems  changes in vision  chest pain  mouth sores  nausea and vomiting  pain, swelling, redness at site where injected  pain, tingling, numbness in the hands or feet  redness, swelling, or sores on hands or feet  stomach pain  unusual bleeding Side effects that usually do not require medical attention (report to your doctor or health care professional if they continue or are bothersome):  changes in finger or toe nails  diarrhea  dry or itchy skin  hair loss  headache  loss of appetite  sensitivity of eyes to the light  stomach upset  unusually teary eyes This list may not describe all possible side effects. Call your doctor for medical advice about side effects. You may report side effects to FDA at 1-800-FDA-1088. Where should I keep my medicine? This drug is given in a hospital or clinic and will  not be stored at home. NOTE: This sheet is a summary. It may not cover all possible information. If you have questions about this medicine, talk to your doctor, pharmacist, or health care provider.  2021 [employer]/Gold Standard (2019-07-28 15:00:03)  Nivolumab injection What is this medicine? NIVOLUMAB (nye VOL ue mab) is a monoclonal antibody. It treats certain types of cancer. Some of the cancers treated are  colon cancer, head and neck cancer, Hodgkin lymphoma, lung cancer, and melanoma. This medicine may be used for other purposes; ask your health care provider or pharmacist if you have questions. COMMON BRAND NAME(S): Opdivo What should I tell my health care provider before I take this medicine? They need to know if you have any of these conditions:  autoimmune diseases like Crohn's disease, ulcerative colitis, or lupus  have had or planning to have an allogeneic stem cell transplant (uses someone else's stem cells)  history of chest radiation  history of organ transplant  nervous system problems like myasthenia gravis or Guillain-Barre syndrome  an unusual or allergic reaction to nivolumab, other medicines, foods, dyes, or preservatives  pregnant or trying to get pregnant  breast-feeding How should I use this medicine? This medicine is for infusion into a vein. It is given by a health care professional in a hospital or clinic setting. A special MedGuide will be given to you before each treatment. Be sure to read this information carefully each time. Talk to your pediatrician regarding the use of this medicine in children. While this drug may be prescribed for children as Dav[place] as 12 years for selected conditions, precautions do apply. Overdosage: If you think you have taken too much of this medicine contact a poison control center or emergency room at once. NOTE: This medicine is only for you. Do not share this medicine with others. What if I miss a dose? It is important not to  miss your dose. Call your doctor or health care professional if you are unable to keep an appointment. What may interact with this medicine? Interactions have not been studied. This list may not describe all possible interactions. Give your health care provider a list of all the medicines, herbs, non-prescription drugs, or dietary supplements you use. Also tell them if you smoke, drink alcohol, or use illegal drugs. Some items may interact with your medicine. What should I watch for while using this medicine? This drug may make you feel generally unwell. Continue your course of treatment even though you feel ill unless your doctor tells you to stop. You may need blood work done while you are taking this medicine. Do not become pregnant while taking this medicine or for 5 months after stopping it. Women should inform their doctor if they wish to become pregnant or think they might be pregnant. There is a potential for serious side effects to an unborn child. Talk to your health care professional or pharmacist for more information. Do not breast-feed an infant while taking this medicine or for 5 months after stopping it. What side effects may I notice from receiving this medicine? Side effects that you should report to your doctor or health care professional as soon as possible:  allergic reactions like skin rash, itching or hives, swelling of the face, lips, or tongue  breathing problems  blood in the urine  bloody or watery diarrhea or black, tarry stools  changes in emotions or moods  changes in vision  chest pain  cough  dizziness  feeling faint or lightheaded, falls  fever, chills  headache with fever, neck stiffness, confusion, loss of memory, sensitivity to light, hallucination, loss of contact with reality, or seizures  joint pain  mouth sores  redness, blistering, peeling or loosening of the skin, including inside the mouth  severe muscle pain or weakness  signs and  symptoms of high blood sugar such as dizziness; dry mouth; dry skin; fruity breath; nausea; stomach  pain; increased hunger or thirst; increased urination  signs and symptoms of kidney injury like trouble passing urine or change in the amount of urine  signs and symptoms of liver injury like dark yellow or brown urine; general ill feeling or flu-like symptoms; light-colored stools; loss of appetite; nausea; right upper belly pain; unusually weak or tired; yellowing of the eyes or skin  swelling of the ankles, feet, hands  trouble passing urine or change in the amount of urine  unusually weak or tired  weight gain or loss Side effects that usually do not require medical attention (report to your doctor or health care professional if they continue or are bothersome):  bone pain  constipation  decreased appetite  diarrhea  muscle pain  nausea, vomiting  tiredness This list may not describe all possible side effects. Call your doctor for medical advice about side effects. You may report side effects to FDA at 1-800-FDA-1088. Where should I keep my medicine? This drug is given in a hospital or clinic and will not be stored at home. NOTE: This sheet is a summary. It may not cover all possible information. If you have questions about this medicine, talk to your doctor, pharmacist, or health care provider.  2021 [employer]/Gold Standard (2019-12-30 10:08:25)  Trastuzumab injection for infusion What is this medicine? TRASTUZUMAB (tras TOO zoo mab) is a monoclonal antibody. It is used to treat breast cancer and stomach cancer. This medicine may be used for other purposes; ask your health care provider or pharmacist if you have questions. COMMON BRAND NAME(S): Herceptin, Galvin Proffer, Trazimera What should I tell my health care provider before I take this medicine? They need to know if you have any of these conditions:  heart disease  heart failure  lung or  breathing disease, like asthma  an unusual or allergic reaction to trastuzumab, benzyl alcohol, or other medications, foods, dyes, or preservatives  pregnant or trying to get pregnant  breast-feeding How should I use this medicine? This drug is given as an infusion into a vein. It is administered in a hospital or clinic by a specially trained health care professional. Talk to your pediatrician regarding the use of this medicine in children. This medicine is not approved for use in children. Overdosage: If you think you have taken too much of this medicine contact a poison control center or emergency room at once. NOTE: This medicine is only for you. Do not share this medicine with others. What if I miss a dose? It is important not to miss a dose. Call your doctor or health care professional if you are unable to keep an appointment. What may interact with this medicine? This medicine may interact with the following medications:  certain types of chemotherapy, such as daunorubicin, doxorubicin, epirubicin, and idarubicin This list may not describe all possible interactions. Give your health care provider a list of all the medicines, herbs, non-prescription drugs, or dietary supplements you use. Also tell them if you smoke, drink alcohol, or use illegal drugs. Some items may interact with your medicine. What should I watch for while using this medicine? Visit your doctor for checks on your progress. Report any side effects. Continue your course of treatment even though you feel ill unless your doctor tells you to stop. Call your doctor or health care professional for advice if you get a fever, chills or sore throat, or other symptoms of a cold or flu. Do not treat yourself. Try to avoid being around people  who are sick. You may experience fever, chills and shaking during your first infusion. These effects are usually mild and can be treated with other medicines. Report any side effects during the  infusion to your health care professional. Fever and chills usually do not happen with later infusions. Do not become pregnant while taking this medicine or for 7 months after stopping it. Women should inform their doctor if they wish to become pregnant or think they might be pregnant. Women of child-bearing potential will need to have a negative pregnancy test before starting this medicine. There is a potential for serious side effects to an unborn child. Talk to your health care professional or pharmacist for more information. Do not breast-feed an infant while taking this medicine or for 7 months after stopping it. Women must use effective birth control with this medicine. What side effects may I notice from receiving this medicine? Side effects that you should report to your doctor or health care professional as soon as possible:  allergic reactions like skin rash, itching or hives, swelling of the face, lips, or tongue  chest pain or palpitations  cough  dizziness  feeling faint or lightheaded, falls  fever  general ill feeling or flu-like symptoms  signs of worsening heart failure like breathing problems; swelling in your legs and feet  unusually weak or tired Side effects that usually do not require medical attention (report to your doctor or health care professional if they continue or are bothersome):  bone pain  changes in taste  diarrhea  joint pain  nausea/vomiting  weight loss This list may not describe all possible side effects. Call your doctor for medical advice about side effects. You may report side effects to FDA at 1-800-FDA-1088. Where should I keep my medicine? This drug is given in a hospital or clinic and will not be stored at home. NOTE: This sheet is a summary. It may not cover all possible information. If you have questions about this medicine, talk to your doctor, pharmacist, or health care provider.  2021 Elsevier/Gold Standard (2016-08-21  14:37:52)

## 2020-09-20 ENCOUNTER — Encounter: Payer: Self-pay | Admitting: *Deleted

## 2020-09-20 LAB — T4: T4, Total: 6.2 ug/dL (ref 4.5–12.0)

## 2020-09-20 NOTE — Progress Notes (Signed)
Called patient after his first treatment yesterday, while navigator was out of office. Spoke to patients daughter, Jimmy Blankenship, who states the patient is doing well. He's eating and drinking well and she states he feels "normal". They have antiemetics at home, and he is already taking them as a preventive treatment. Jimmy Blankenship knows that the patient comes in tomorrow for pump dc.   She has no questions or concerns at this time. She knows to call the office if this changes.  Oncology Nurse Navigator Documentation  Oncology Nurse Navigator Flowsheets 09/20/2020  Abnormal Finding Date -  Diagnosis Status -  Planned Course of Treatment -  Phase of Treatment Chemo  Chemotherapy Pending- Reason: -  Chemotherapy Actual Start Date: 09/19/2020  Navigator Follow Up Date: 10/03/2020  Navigator Follow Up Reason: Follow-up Appointment;Chemotherapy  Navigator Location [facility]  Referral Date to RadOnc/MedOnc -  Navigator Encounter Type Telephone  Telephone Outgoing Call;Patient Update  Patient Visit Type MedOnc  Treatment Phase Active Tx  Barriers/Navigation Needs Coordination of Care;Education;Language/Communication  Education Pain/ Symptom Management  Interventions Education;Psycho-Social Support  Acuity Level 3-Moderate Needs (3-4 Barriers Identified)  Coordination of Care -  Education Method Verbal  Support Groups/Services Friends and Family  Time Spent with Patient 30

## 2020-09-21 ENCOUNTER — Other Ambulatory Visit: Payer: Self-pay

## 2020-09-21 ENCOUNTER — Inpatient Hospital Stay: Payer: Medicare Other

## 2020-09-21 VITALS — BP 120/57 | HR 79 | Temp 98.2°F | Resp 18

## 2020-09-21 DIAGNOSIS — C799 Secondary malignant neoplasm of unspecified site: Secondary | ICD-10-CM

## 2020-09-21 DIAGNOSIS — Z5112 Encounter for antineoplastic immunotherapy: Secondary | ICD-10-CM | POA: Diagnosis not present

## 2020-09-21 DIAGNOSIS — C169 Malignant neoplasm of stomach, unspecified: Secondary | ICD-10-CM

## 2020-09-21 DIAGNOSIS — C165 Malignant neoplasm of lesser curvature of stomach, unspecified: Secondary | ICD-10-CM

## 2020-09-21 MED ORDER — SODIUM CHLORIDE 0.9% FLUSH
10.0000 mL | INTRAVENOUS | Status: DC | PRN
  Administered 2020-09-21 (×4): 10 mL
  Filled 2020-09-21: qty 10

## 2020-09-21 MED ORDER — HEPARIN SOD (PORK) LOCK FLUSH 100 UNIT/ML IV SOLN
500.0000 [IU] | Freq: Once | INTRAVENOUS | Status: AC | PRN
  Administered 2020-09-21 (×2): 500 [IU]
  Filled 2020-09-21: qty 5

## 2020-09-22 ENCOUNTER — Ambulatory Visit: Payer: Medicare Other | Admitting: Gastroenterology

## 2020-09-23 ENCOUNTER — Encounter (HOSPITAL_COMMUNITY): Payer: Self-pay

## 2020-10-03 ENCOUNTER — Encounter: Payer: Self-pay | Admitting: Hematology & Oncology

## 2020-10-03 ENCOUNTER — Other Ambulatory Visit: Payer: Self-pay

## 2020-10-03 ENCOUNTER — Telehealth: Payer: Self-pay | Admitting: Hematology & Oncology

## 2020-10-03 ENCOUNTER — Inpatient Hospital Stay: Payer: Medicare Other

## 2020-10-03 ENCOUNTER — Inpatient Hospital Stay (HOSPITAL_BASED_OUTPATIENT_CLINIC_OR_DEPARTMENT_OTHER): Payer: Medicare Other | Admitting: Hematology & Oncology

## 2020-10-03 VITALS — BP 135/75 | HR 89 | Temp 96.5°F | Resp 18 | Wt 147.0 lb

## 2020-10-03 DIAGNOSIS — C165 Malignant neoplasm of lesser curvature of stomach, unspecified: Secondary | ICD-10-CM

## 2020-10-03 DIAGNOSIS — C799 Secondary malignant neoplasm of unspecified site: Secondary | ICD-10-CM

## 2020-10-03 DIAGNOSIS — Z5112 Encounter for antineoplastic immunotherapy: Secondary | ICD-10-CM | POA: Diagnosis not present

## 2020-10-03 DIAGNOSIS — C169 Malignant neoplasm of stomach, unspecified: Secondary | ICD-10-CM

## 2020-10-03 LAB — CMP (CANCER CENTER ONLY)
ALT: 14 U/L (ref 0–44)
AST: 12 U/L — ABNORMAL LOW (ref 15–41)
Albumin: 3.4 g/dL — ABNORMAL LOW (ref 3.5–5.0)
Alkaline Phosphatase: 53 U/L (ref 38–126)
Anion gap: 8 (ref 5–15)
BUN: 17 mg/dL (ref 8–23)
CO2: 26 mmol/L (ref 22–32)
Calcium: 8.7 mg/dL — ABNORMAL LOW (ref 8.9–10.3)
Chloride: 100 mmol/L (ref 98–111)
Creatinine: 1.04 mg/dL (ref 0.61–1.24)
GFR, Estimated: >60 mL/min (ref >60)
Glucose, Bld: 239 mg/dL — ABNORMAL HIGH (ref 70–99)
Potassium: 3.6 mmol/L (ref 3.5–5.1)
Sodium: 134 mmol/L — ABNORMAL LOW (ref 135–145)
Total Bilirubin: 0.4 mg/dL (ref 0.3–1.2)
Total Protein: 5.6 g/dL — ABNORMAL LOW (ref 6.5–8.1)

## 2020-10-03 LAB — FERRITIN: Ferritin: 194 ng/mL (ref 24–336)

## 2020-10-03 LAB — CBC WITH DIFFERENTIAL (CANCER CENTER ONLY)
Abs Immature Granulocytes: 0.27 10*3/uL — ABNORMAL HIGH (ref 0.00–0.07)
Basophils Absolute: 0 10*3/uL (ref 0.0–0.1)
Basophils Relative: 1 %
Eosinophils Absolute: 0.2 10*3/uL (ref 0.0–0.5)
Eosinophils Relative: 5 %
HCT: 29.2 % — ABNORMAL LOW (ref 39.0–52.0)
Hemoglobin: 9.4 g/dL — ABNORMAL LOW (ref 13.0–17.0)
Immature Granulocytes: 7 %
Lymphocytes Relative: 32 %
Lymphs Abs: 1.2 10*3/uL (ref 0.7–4.0)
MCH: 25.7 pg — ABNORMAL LOW (ref 26.0–34.0)
MCHC: 32.2 g/dL (ref 30.0–36.0)
MCV: 79.8 fL — ABNORMAL LOW (ref 80.0–100.0)
Monocytes Absolute: 0.8 10*3/uL (ref 0.1–1.0)
Monocytes Relative: 23 %
Neutro Abs: 1.2 10*3/uL — ABNORMAL LOW (ref 1.7–7.7)
Neutrophils Relative %: 32 %
Platelet Count: 178 10*3/uL (ref 150–400)
RBC: 3.66 MIL/uL — ABNORMAL LOW (ref 4.22–5.81)
RDW: 23.4 % — ABNORMAL HIGH (ref 11.5–15.5)
WBC Count: 3.7 10*3/uL — ABNORMAL LOW (ref 4.0–10.5)
nRBC: 1.1 % — ABNORMAL HIGH (ref 0.0–0.2)

## 2020-10-03 LAB — LACTATE DEHYDROGENASE: LDH: 139 U/L (ref 98–192)

## 2020-10-03 LAB — IRON AND TIBC
Iron: 29 ug/dL — ABNORMAL LOW (ref 42–163)
Saturation Ratios: 13 % — ABNORMAL LOW (ref 20–55)
TIBC: 216 ug/dL (ref 202–409)
UIBC: 187 ug/dL (ref 117–376)

## 2020-10-03 LAB — TSH: TSH: 0.483 u[IU]/mL (ref 0.320–4.118)

## 2020-10-03 MED ORDER — SODIUM CHLORIDE 0.9 % IV SOLN
10.0000 mg | Freq: Once | INTRAVENOUS | Status: AC
  Administered 2020-10-03: 10 mg via INTRAVENOUS
  Filled 2020-10-03: qty 10

## 2020-10-03 MED ORDER — SODIUM CHLORIDE 0.9% FLUSH
10.0000 mL | INTRAVENOUS | Status: DC | PRN
  Filled 2020-10-03: qty 10

## 2020-10-03 MED ORDER — DIPHENHYDRAMINE HCL 25 MG PO CAPS
50.0000 mg | ORAL_CAPSULE | Freq: Once | ORAL | Status: AC
  Administered 2020-10-03: 50 mg via ORAL

## 2020-10-03 MED ORDER — OXALIPLATIN CHEMO INJECTION 100 MG/20ML
85.0000 mg/m2 | Freq: Once | INTRAVENOUS | Status: AC
  Administered 2020-10-03: 150 mg via INTRAVENOUS
  Filled 2020-10-03: qty 30

## 2020-10-03 MED ORDER — FLUOROURACIL CHEMO INJECTION 2.5 GM/50ML
400.0000 mg/m2 | Freq: Once | INTRAVENOUS | Status: AC
  Administered 2020-10-03: 700 mg via INTRAVENOUS
  Filled 2020-10-03: qty 14

## 2020-10-03 MED ORDER — LEUCOVORIN CALCIUM INJECTION 350 MG
400.0000 mg/m2 | Freq: Once | INTRAVENOUS | Status: AC
  Administered 2020-10-03: 700 mg via INTRAVENOUS
  Filled 2020-10-03: qty 35

## 2020-10-03 MED ORDER — DEXTROSE 5 % IV SOLN
Freq: Once | INTRAVENOUS | Status: AC
  Filled 2020-10-03: qty 250

## 2020-10-03 MED ORDER — TRASTUZUMAB-DKST CHEMO 150 MG IV SOLR
4.0000 mg/kg | Freq: Once | INTRAVENOUS | Status: AC
  Administered 2020-10-03: 252 mg via INTRAVENOUS
  Filled 2020-10-03: qty 12

## 2020-10-03 MED ORDER — DIPHENHYDRAMINE HCL 25 MG PO CAPS
ORAL_CAPSULE | ORAL | Status: AC
  Filled 2020-10-03: qty 2

## 2020-10-03 MED ORDER — ACETAMINOPHEN 325 MG PO TABS
650.0000 mg | ORAL_TABLET | Freq: Once | ORAL | Status: AC
  Administered 2020-10-03: 650 mg via ORAL

## 2020-10-03 MED ORDER — HEPARIN SOD (PORK) LOCK FLUSH 100 UNIT/ML IV SOLN
500.0000 [IU] | Freq: Once | INTRAVENOUS | Status: DC | PRN
  Filled 2020-10-03: qty 5

## 2020-10-03 MED ORDER — FLUOROURACIL CHEMO INJECTION 5 GM/100ML
2160.0000 mg/m2 | INTRAVENOUS | Status: DC
  Administered 2020-10-03: 3800 mg via INTRAVENOUS
  Filled 2020-10-03: qty 76

## 2020-10-03 MED ORDER — LIDOCAINE-PRILOCAINE 2.5-2.5 % EX CREA
TOPICAL_CREAM | CUTANEOUS | Status: AC
  Filled 2020-10-03: qty 30

## 2020-10-03 MED ORDER — ACETAMINOPHEN 325 MG PO TABS
ORAL_TABLET | ORAL | Status: AC
  Filled 2020-10-03: qty 2

## 2020-10-03 MED ORDER — PALONOSETRON HCL INJECTION 0.25 MG/5ML
INTRAVENOUS | Status: AC
  Filled 2020-10-03: qty 5

## 2020-10-03 MED ORDER — PALONOSETRON HCL INJECTION 0.25 MG/5ML
0.2500 mg | Freq: Once | INTRAVENOUS | Status: AC
  Administered 2020-10-03: 0.25 mg via INTRAVENOUS

## 2020-10-03 NOTE — Patient Instructions (Signed)

## 2020-10-03 NOTE — Progress Notes (Signed)
Hematology and Oncology Follow Up Visit  Jimmy Blankenship [id number] September 11, 1950 70 y.o. 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  He is doing well.  He tolerated his first cycle of treatment well.  We actually were able to add Herceptin because the tumor does contain the HER-2 gene.  We really are giving him full therapy with chemotherapy/immunotherapy and targeted therapy.  I would like to believe that he is going to respond.  He has not had diarrhea.  There has been no bleeding.  He has had no problems with bowels or bladder.  He has had no mouth sores.  He has had no rashes.  There has been no headache.  Overall, his performance status is ECOG 1.  Medications:  Current Outpatient Medications:  .  atorvastatin (LIPITOR) 80 MG tablet, Take 1 tablet (80 mg total) by mouth daily., Disp: 90 tablet, Rfl: 3 .  Cyanocobalamin (VITAMIN B-12 PO), Take 1 tablet by mouth daily., Disp: , Rfl:  .  dexamethasone (DECADRON) 4 MG tablet, Take 2 tablets (8 mg total) by mouth daily. Start the day after chemotherapy for 2 days. Take with food., Disp: 30 tablet, Rfl: 1 .  feeding supplement (ENSURE ENLIVE / ENSURE PLUS) LIQD, Take 237 mLs by mouth 2 (two) times daily between meals., Disp: 237 mL, Rfl: 12 .  gabapentin (NEURONTIN) 300 MG capsule, Take 300 mg by mouth daily., Disp: , Rfl:  .  lidocaine-prilocaine (EMLA) cream, Apply to affected area once, Disp: 30 g, Rfl: 3 .  lisinopril (ZESTRIL) 2.5 MG tablet, Take 2.5 mg by mouth daily., Disp: , Rfl:  .  metFORMIN (GLUCOPHAGE) 1000 MG tablet, Take by mouth., Disp: , Rfl:  .  metoprolol tartrate (LOPRESSOR) 50 MG tablet, Take 1 tablet (50 mg total) by mouth 2 (two) times daily., Disp: 180 tablet, Rfl: 1 .  ondansetron (ZOFRAN) 8 MG  tablet, Take 1 tablet (8 mg total) by mouth 2 (two) times daily as needed for refractory nausea / vomiting. Start on day 3 after chemotherapy., Disp: 30 tablet, Rfl: 1 .  pantoprazole (PROTONIX) 40 MG tablet, Take 1 tablet (40 mg total) by mouth 2 (two) times daily., Disp: 60 tablet, Rfl: 1 .  prochlorperazine (COMPAZINE) 10 MG tablet, Take 1 tablet (10 mg total) by mouth every 6 (six) hours as needed (Nausea or vomiting)., Disp: 30 tablet, Rfl: 1  Allergies: No Known Allergies  Past Medical History, Surgical history, Social history, and Family History were reviewed and updated.  Review of Systems: Review of Systems  Constitutional: Negative.   HENT:  Negative.   Eyes: Negative.   Respiratory: Negative.   Cardiovascular: Negative.   Endocrine: Negative.   Genitourinary: Negative.    Musculoskeletal: Negative.   Skin: Negative.   Neurological: Negative.   Hematological: Negative.   Psychiatric/Behavioral: Negative.     Physical Exam:  weight is 147 lb (66.7 kg). His oral temperature is 96.5 F (35.8 C) (abnormal). His blood pressure is 135/75 and his pulse is 89. His respiration is 18 and oxygen saturation is 99%.   Wt Readings from Last 3 Encounters:  10/03/20 147 lb (66.7 kg)  09/16/20 140 lb (63.5 kg)  08/22/20 143 lb (64.9 kg)    Physical Exam Vitals reviewed.  HENT:  Head: Normocephalic and atraumatic.  Eyes:     Pupils: Pupils are equal, round, and reactive to light.  Cardiovascular:     Rate and Rhythm: Normal rate and regular rhythm.     Heart sounds: Normal heart sounds.  Pulmonary:     Effort: Pulmonary effort is normal.     Breath sounds: Normal breath sounds.  Abdominal:     General: Bowel sounds are normal.     Palpations: Abdomen is soft.  Musculoskeletal:        General: No tenderness or deformity. Normal range of motion.     Cervical back: Normal range of motion.  Lymphadenopathy:     Cervical: No cervical adenopathy.  Skin:    General: Skin is  warm and dry.     Findings: No erythema or rash.  Neurological:     Mental Status: He is alert and oriented to person, place, and time.  Psychiatric:        Behavior: Behavior normal.        Thought Content: Thought content normal.        Judgment: Judgment normal.    Lab Results  Component Value Date   WBC 3.7 (L) 10/03/2020   HGB 9.4 (L) 10/03/2020   HCT 29.2 (L) 10/03/2020   MCV 79.8 (L) 10/03/2020   PLT 178 10/03/2020     Chemistry      Component Value Date/Time   NA 134 (L) 10/03/2020 0810   NA 140 06/10/2020 0935   K 3.6 10/03/2020 0810   CL 100 10/03/2020 0810   CO2 26 10/03/2020 0810   BUN 17 10/03/2020 0810   BUN 17 06/10/2020 0935   CREATININE 1.04 10/03/2020 0810      Component Value Date/Time   CALCIUM 8.7 (L) 10/03/2020 0810   ALKPHOS 53 10/03/2020 0810   AST 12 (L) 10/03/2020 0810   ALT 14 10/03/2020 0810   BILITOT 0.4 10/03/2020 0810      Impression and Plan: Jimmy Blankenship is a very nice 70 year old [name] male.  He has metastatic adenocarcinoma of the stomach.  He has had his first cycle of chemotherapy along with immunotherapy and targeted therapy.  I am just happy that we can give him a full palate of treatment.  Again, I have to believe he is going to respond.  I would not check him until after his fourth cycle of treatment however.  We will go ahead with our second cycle of treatment.  I do not think we have to make any dosage adjustments.  We will plan to get him back in 2 weeks for his third cycle of treatment.    Volanda Napoleon, MD 1/24/20229:00 AM

## 2020-10-03 NOTE — Telephone Encounter (Signed)
Called and spoke with daughter regarding appointments that have been added for iron infusion.  Per  1/24 sch msg

## 2020-10-04 ENCOUNTER — Encounter: Payer: Self-pay | Admitting: *Deleted

## 2020-10-04 LAB — T4: T4, Total: 4.7 ug/dL (ref 4.5–12.0)

## 2020-10-04 NOTE — Progress Notes (Signed)
Oncology Nurse Navigator Documentation  Oncology Nurse Navigator Flowsheets 10/04/2020  Abnormal Finding Date -  Diagnosis Status -  Planned Course of Treatment -  Phase of Treatment -  Chemotherapy Pending- Reason: -  Chemotherapy Actual Start Date: -  Navigator Follow Up Date: 10/17/2020  Navigator Follow Up Reason: Follow-up Appointment;Chemotherapy  Navigator Location [facility]  Referral Date to RadOnc/MedOnc -  Navigator Encounter Type Appt/Treatment Plan Review  Telephone -  Patient Visit Type MedOnc  Treatment Phase Active Tx  Barriers/Navigation Needs Coordination of Care;Education;Language/Communication  Education -  Interventions None Required  Acuity Level 3-Moderate Needs (3-4 Barriers Identified)  Coordination of Care -  Education Method -  Support Groups/Services Friends and Family  Time Spent with Patient 15

## 2020-10-05 ENCOUNTER — Inpatient Hospital Stay: Payer: Medicare Other

## 2020-10-05 ENCOUNTER — Other Ambulatory Visit: Payer: Self-pay | Admitting: Family

## 2020-10-05 ENCOUNTER — Other Ambulatory Visit: Payer: Self-pay

## 2020-10-05 VITALS — BP 117/66 | HR 76 | Resp 18

## 2020-10-05 VITALS — BP 123/74 | HR 74 | Temp 98.2°F | Resp 18

## 2020-10-05 DIAGNOSIS — Z5112 Encounter for antineoplastic immunotherapy: Secondary | ICD-10-CM | POA: Diagnosis not present

## 2020-10-05 DIAGNOSIS — C169 Malignant neoplasm of stomach, unspecified: Secondary | ICD-10-CM

## 2020-10-05 DIAGNOSIS — D500 Iron deficiency anemia secondary to blood loss (chronic): Secondary | ICD-10-CM

## 2020-10-05 DIAGNOSIS — C799 Secondary malignant neoplasm of unspecified site: Secondary | ICD-10-CM

## 2020-10-05 DIAGNOSIS — C165 Malignant neoplasm of lesser curvature of stomach, unspecified: Secondary | ICD-10-CM

## 2020-10-05 DIAGNOSIS — K922 Gastrointestinal hemorrhage, unspecified: Secondary | ICD-10-CM

## 2020-10-05 MED ORDER — HEPARIN SOD (PORK) LOCK FLUSH 100 UNIT/ML IV SOLN
500.0000 [IU] | Freq: Once | INTRAVENOUS | Status: AC | PRN
  Administered 2020-10-05: 500 [IU]
  Filled 2020-10-05: qty 5

## 2020-10-05 MED ORDER — SODIUM CHLORIDE 0.9% FLUSH
10.0000 mL | Freq: Once | INTRAVENOUS | Status: AC | PRN
  Administered 2020-10-05: 10 mL
  Filled 2020-10-05: qty 10

## 2020-10-05 MED ORDER — SODIUM CHLORIDE 0.9 % IV SOLN
Freq: Once | INTRAVENOUS | Status: AC
  Filled 2020-10-05: qty 250

## 2020-10-05 MED ORDER — SODIUM CHLORIDE 0.9 % IV SOLN
200.0000 mg | Freq: Once | INTRAVENOUS | Status: AC
  Administered 2020-10-05: 200 mg via INTRAVENOUS
  Filled 2020-10-05: qty 200

## 2020-10-05 MED ORDER — SODIUM CHLORIDE 0.9 % IV SOLN: 510.0000 mg | Freq: Once | INTRAVENOUS | Status: DC

## 2020-10-05 MED ORDER — HEPARIN SOD (PORK) LOCK FLUSH 100 UNIT/ML IV SOLN
250.0000 [IU] | Freq: Once | INTRAVENOUS | Status: DC | PRN
  Filled 2020-10-05: qty 5

## 2020-10-05 MED ORDER — SODIUM CHLORIDE 0.9% FLUSH
10.0000 mL | Freq: Once | INTRAVENOUS | Status: DC | PRN
  Filled 2020-10-05: qty 10

## 2020-10-05 MED ORDER — SODIUM CHLORIDE 0.9% FLUSH
10.0000 mL | INTRAVENOUS | Status: DC | PRN
  Administered 2020-10-05: 10 mL
  Filled 2020-10-05: qty 10

## 2020-10-05 MED ORDER — SODIUM CHLORIDE 0.9 % IV SOLN
Freq: Once | INTRAVENOUS | Status: DC
  Filled 2020-10-05: qty 250

## 2020-10-05 NOTE — Patient Instructions (Signed)
Iron Sucrose injection (Venofer) What is this medicine? IRON SUCROSE (AHY ern SOO krohs) is an iron complex. Iron is used to make healthy red blood cells, which carry oxygen and nutrients throughout the body. This medicine is used to treat iron deficiency anemia in people with chronic kidney disease. This medicine may be used for other purposes; ask your health care provider or pharmacist if you have questions. COMMON BRAND NAME(S): Venofer What should I tell my health care provider before I take this medicine? They need to know if you have any of these conditions:  anemia not caused by low iron levels  heart disease  high levels of iron in the blood  kidney disease  liver disease  an unusual or allergic reaction to iron, other medicines, foods, dyes, or preservatives  pregnant or trying to get pregnant  breast-feeding How should I use this medicine? This medicine is for infusion into a vein. It is given by a health care professional in a hospital or clinic setting. Talk to your pediatrician regarding the use of this medicine in children. While this drug may be prescribed for children as Deundre as 2 years for selected conditions, precautions do apply. Overdosage: If you think you have taken too much of this medicine contact a poison control center or emergency room at once. NOTE: This medicine is only for you. Do not share this medicine with others. What if I miss a dose? It is important not to miss your dose. Call your doctor or health care professional if you are unable to keep an appointment. What may interact with this medicine? Do not take this medicine with any of the following medications:  deferoxamine  dimercaprol  other iron products This medicine may also interact with the following medications:  chloramphenicol  deferasirox This list may not describe all possible interactions. Give your health care provider a list of all the medicines, herbs, non-prescription  drugs, or dietary supplements you use. Also tell them if you smoke, drink alcohol, or use illegal drugs. Some items may interact with your medicine. What should I watch for while using this medicine? Visit your doctor or healthcare professional regularly. Tell your doctor or healthcare professional if your symptoms do not start to get better or if they get worse. You may need blood work done while you are taking this medicine. You may need to follow a special diet. Talk to your doctor. Foods that contain iron include: whole grains/cereals, dried fruits, beans, or peas, leafy green vegetables, and organ meats (liver, kidney). What side effects may I notice from receiving this medicine? Side effects that you should report to your doctor or health care professional as soon as possible:  allergic reactions like skin rash, itching or hives, swelling of the face, lips, or tongue  breathing problems  changes in blood pressure  cough  fast, irregular heartbeat  feeling faint or lightheaded, falls  fever or chills  flushing, sweating, or hot feelings  joint or muscle aches/pains  seizures  swelling of the ankles or feet  unusually weak or tired Side effects that usually do not require medical attention (report to your doctor or health care professional if they continue or are bothersome):  diarrhea  feeling achy  headache  irritation at site where injected  nausea, vomiting  stomach upset  tiredness This list may not describe all possible side effects. Call your doctor for medical advice about side effects. You may report side effects to FDA at 1-800-FDA-1088. Where should I   keep my medicine? This drug is given in a hospital or clinic and will not be stored at home. NOTE: This sheet is a summary. It may not cover all possible information. If you have questions about this medicine, talk to your doctor, pharmacist, or health care provider.  2021 Elsevier/Gold Standard  (2011-06-07 17:14:35)  

## 2020-10-05 NOTE — Addendum Note (Signed)
Addended by: Burney Gauze R on: 10/05/2020 12:36 PM   Modules accepted: Orders

## 2020-10-07 ENCOUNTER — Other Ambulatory Visit: Payer: Self-pay | Admitting: *Deleted

## 2020-10-07 ENCOUNTER — Telehealth: Payer: Self-pay | Admitting: *Deleted

## 2020-10-07 MED ORDER — GLIMEPIRIDE 2 MG PO TABS: 2.0000 mg | ORAL_TABLET | Freq: Every day | ORAL | 0 refills | Status: DC

## 2020-10-07 NOTE — Telephone Encounter (Signed)
Patient's daughter is calling to notify that the patient's blood sugar is over 300. He is currently on Glucophage and this is managed by the patient's PCP. Dr Marin Olp would like the patient to start taking Amaryl daily, and follow up with the PCP for more long term management of the increased blood sugars.  Daughter is aware of instructions. She will contact PCP office today. She is aware of new prescription and pharmacy confirmed.

## 2020-10-12 ENCOUNTER — Other Ambulatory Visit: Payer: Self-pay

## 2020-10-12 ENCOUNTER — Inpatient Hospital Stay: Payer: Medicare Other | Attending: Hematology & Oncology

## 2020-10-12 VITALS — BP 125/65 | HR 75 | Temp 98.2°F | Resp 18

## 2020-10-12 DIAGNOSIS — C787 Secondary malignant neoplasm of liver and intrahepatic bile duct: Secondary | ICD-10-CM | POA: Insufficient documentation

## 2020-10-12 DIAGNOSIS — Z5111 Encounter for antineoplastic chemotherapy: Secondary | ICD-10-CM | POA: Insufficient documentation

## 2020-10-12 DIAGNOSIS — K922 Gastrointestinal hemorrhage, unspecified: Secondary | ICD-10-CM | POA: Diagnosis not present

## 2020-10-12 DIAGNOSIS — Z79899 Other long term (current) drug therapy: Secondary | ICD-10-CM | POA: Insufficient documentation

## 2020-10-12 DIAGNOSIS — D500 Iron deficiency anemia secondary to blood loss (chronic): Secondary | ICD-10-CM | POA: Insufficient documentation

## 2020-10-12 DIAGNOSIS — C165 Malignant neoplasm of lesser curvature of stomach, unspecified: Secondary | ICD-10-CM | POA: Insufficient documentation

## 2020-10-12 DIAGNOSIS — Z5112 Encounter for antineoplastic immunotherapy: Secondary | ICD-10-CM | POA: Diagnosis not present

## 2020-10-12 MED ORDER — SODIUM CHLORIDE 0.9 % IV SOLN
200.0000 mg | Freq: Once | INTRAVENOUS | Status: AC
  Administered 2020-10-12: 200 mg via INTRAVENOUS
  Filled 2020-10-12: qty 200

## 2020-10-12 MED ORDER — SODIUM CHLORIDE 0.9% FLUSH
10.0000 mL | Freq: Once | INTRAVENOUS | Status: AC | PRN
  Administered 2020-10-12: 10 mL
  Filled 2020-10-12: qty 10

## 2020-10-12 MED ORDER — HEPARIN SOD (PORK) LOCK FLUSH 100 UNIT/ML IV SOLN
500.0000 [IU] | Freq: Once | INTRAVENOUS | Status: AC | PRN
  Administered 2020-10-12: 500 [IU]
  Filled 2020-10-12: qty 5

## 2020-10-12 MED ORDER — SODIUM CHLORIDE 0.9 % IV SOLN
Freq: Once | INTRAVENOUS | Status: AC
  Filled 2020-10-12: qty 250

## 2020-10-12 NOTE — Patient Instructions (Signed)
Iron Sucrose injection (Venofer) What is this medicine? IRON SUCROSE (AHY ern SOO krohs) is an iron complex. Iron is used to make healthy red blood cells, which carry oxygen and nutrients throughout the body. This medicine is used to treat iron deficiency anemia in people with chronic kidney disease. This medicine may be used for other purposes; ask your health care provider or pharmacist if you have questions. COMMON BRAND NAME(S): Venofer What should I tell my health care provider before I take this medicine? They need to know if you have any of these conditions:  anemia not caused by low iron levels  heart disease  high levels of iron in the blood  kidney disease  liver disease  an unusual or allergic reaction to iron, other medicines, foods, dyes, or preservatives  pregnant or trying to get pregnant  breast-feeding How should I use this medicine? This medicine is for infusion into a vein. It is given by a health care professional in a hospital or clinic setting. Talk to your pediatrician regarding the use of this medicine in children. While this drug may be prescribed for children as Areeb as 2 years for selected conditions, precautions do apply. Overdosage: If you think you have taken too much of this medicine contact a poison control center or emergency room at once. NOTE: This medicine is only for you. Do not share this medicine with others. What if I miss a dose? It is important not to miss your dose. Call your doctor or health care professional if you are unable to keep an appointment. What may interact with this medicine? Do not take this medicine with any of the following medications:  deferoxamine  dimercaprol  other iron products This medicine may also interact with the following medications:  chloramphenicol  deferasirox This list may not describe all possible interactions. Give your health care provider a list of all the medicines, herbs, non-prescription  drugs, or dietary supplements you use. Also tell them if you smoke, drink alcohol, or use illegal drugs. Some items may interact with your medicine. What should I watch for while using this medicine? Visit your doctor or healthcare professional regularly. Tell your doctor or healthcare professional if your symptoms do not start to get better or if they get worse. You may need blood work done while you are taking this medicine. You may need to follow a special diet. Talk to your doctor. Foods that contain iron include: whole grains/cereals, dried fruits, beans, or peas, leafy green vegetables, and organ meats (liver, kidney). What side effects may I notice from receiving this medicine? Side effects that you should report to your doctor or health care professional as soon as possible:  allergic reactions like skin rash, itching or hives, swelling of the face, lips, or tongue  breathing problems  changes in blood pressure  cough  fast, irregular heartbeat  feeling faint or lightheaded, falls  fever or chills  flushing, sweating, or hot feelings  joint or muscle aches/pains  seizures  swelling of the ankles or feet  unusually weak or tired Side effects that usually do not require medical attention (report to your doctor or health care professional if they continue or are bothersome):  diarrhea  feeling achy  headache  irritation at site where injected  nausea, vomiting  stomach upset  tiredness This list may not describe all possible side effects. Call your doctor for medical advice about side effects. You may report side effects to FDA at 1-800-FDA-1088. Where should I   keep my medicine? This drug is given in a hospital or clinic and will not be stored at home. NOTE: This sheet is a summary. It may not cover all possible information. If you have questions about this medicine, talk to your doctor, pharmacist, or health care provider.  2021 Elsevier/Gold Standard  (2011-06-07 17:14:35)  

## 2020-10-17 ENCOUNTER — Telehealth: Payer: Self-pay

## 2020-10-17 ENCOUNTER — Other Ambulatory Visit: Payer: Self-pay | Admitting: *Deleted

## 2020-10-17 ENCOUNTER — Other Ambulatory Visit: Payer: Self-pay

## 2020-10-17 ENCOUNTER — Inpatient Hospital Stay: Payer: Medicare Other

## 2020-10-17 ENCOUNTER — Ambulatory Visit: Payer: Medicare Other

## 2020-10-17 ENCOUNTER — Encounter: Payer: Self-pay | Admitting: Hematology & Oncology

## 2020-10-17 ENCOUNTER — Inpatient Hospital Stay: Payer: Medicare Other | Admitting: Hematology & Oncology

## 2020-10-17 VITALS — BP 109/60 | HR 79 | Temp 97.9°F | Resp 18 | Wt 146.0 lb

## 2020-10-17 DIAGNOSIS — D500 Iron deficiency anemia secondary to blood loss (chronic): Secondary | ICD-10-CM | POA: Diagnosis not present

## 2020-10-17 DIAGNOSIS — C169 Malignant neoplasm of stomach, unspecified: Secondary | ICD-10-CM

## 2020-10-17 DIAGNOSIS — Z5112 Encounter for antineoplastic immunotherapy: Secondary | ICD-10-CM | POA: Diagnosis not present

## 2020-10-17 DIAGNOSIS — C165 Malignant neoplasm of lesser curvature of stomach, unspecified: Secondary | ICD-10-CM

## 2020-10-17 DIAGNOSIS — C799 Secondary malignant neoplasm of unspecified site: Secondary | ICD-10-CM

## 2020-10-17 LAB — IRON AND TIBC
Iron: 36 ug/dL — ABNORMAL LOW (ref 42–163)
Saturation Ratios: 18 % — ABNORMAL LOW (ref 20–55)
TIBC: 203 ug/dL (ref 202–409)
UIBC: 167 ug/dL (ref 117–376)

## 2020-10-17 LAB — CBC WITH DIFFERENTIAL (CANCER CENTER ONLY)
Abs Immature Granulocytes: 0.38 10*3/uL — ABNORMAL HIGH (ref 0.00–0.07)
Basophils Absolute: 0 10*3/uL (ref 0.0–0.1)
Basophils Relative: 1 %
Eosinophils Absolute: 0.1 10*3/uL (ref 0.0–0.5)
Eosinophils Relative: 3 %
HCT: 29.5 % — ABNORMAL LOW (ref 39.0–52.0)
Hemoglobin: 9.5 g/dL — ABNORMAL LOW (ref 13.0–17.0)
Immature Granulocytes: 10 %
Lymphocytes Relative: 22 %
Lymphs Abs: 0.9 10*3/uL (ref 0.7–4.0)
MCH: 26 pg (ref 26.0–34.0)
MCHC: 32.2 g/dL (ref 30.0–36.0)
MCV: 80.6 fL (ref 80.0–100.0)
Monocytes Absolute: 1.3 10*3/uL — ABNORMAL HIGH (ref 0.1–1.0)
Monocytes Relative: 32 %
Neutro Abs: 1.3 10*3/uL — ABNORMAL LOW (ref 1.7–7.7)
Neutrophils Relative %: 32 %
Platelet Count: 197 10*3/uL (ref 150–400)
RBC: 3.66 MIL/uL — ABNORMAL LOW (ref 4.22–5.81)
RDW: 21.5 % — ABNORMAL HIGH (ref 11.5–15.5)
WBC Count: 4 10*3/uL (ref 4.0–10.5)
nRBC: 0.5 % — ABNORMAL HIGH (ref 0.0–0.2)

## 2020-10-17 LAB — LACTATE DEHYDROGENASE: LDH: 165 U/L (ref 98–192)

## 2020-10-17 LAB — TSH: TSH: 1.109 u[IU]/mL (ref 0.320–4.118)

## 2020-10-17 LAB — CMP (CANCER CENTER ONLY)
ALT: 16 U/L (ref 0–44)
AST: 13 U/L — ABNORMAL LOW (ref 15–41)
Albumin: 3.3 g/dL — ABNORMAL LOW (ref 3.5–5.0)
Alkaline Phosphatase: 52 U/L (ref 38–126)
Anion gap: 9 (ref 5–15)
BUN: 14 mg/dL (ref 8–23)
CO2: 25 mmol/L (ref 22–32)
Calcium: 8.8 mg/dL — ABNORMAL LOW (ref 8.9–10.3)
Chloride: 102 mmol/L (ref 98–111)
Creatinine: 1.15 mg/dL (ref 0.61–1.24)
GFR, Estimated: >60 mL/min (ref >60)
Glucose, Bld: 244 mg/dL — ABNORMAL HIGH (ref 70–99)
Potassium: 3.3 mmol/L — ABNORMAL LOW (ref 3.5–5.1)
Sodium: 136 mmol/L (ref 135–145)
Total Bilirubin: 0.5 mg/dL (ref 0.3–1.2)
Total Protein: 5.5 g/dL — ABNORMAL LOW (ref 6.5–8.1)

## 2020-10-17 LAB — FERRITIN: Ferritin: 910 ng/mL — ABNORMAL HIGH (ref 24–336)

## 2020-10-17 MED ORDER — LEUCOVORIN CALCIUM INJECTION 350 MG
400.0000 mg/m2 | Freq: Once | INTRAVENOUS | Status: AC
  Administered 2020-10-17: 700 mg via INTRAVENOUS
  Filled 2020-10-17: qty 35

## 2020-10-17 MED ORDER — ACETAMINOPHEN 325 MG PO TABS
ORAL_TABLET | ORAL | Status: AC
  Filled 2020-10-17: qty 2

## 2020-10-17 MED ORDER — DEXTROSE 5 % IV SOLN
Freq: Once | INTRAVENOUS | Status: AC
  Filled 2020-10-17: qty 250

## 2020-10-17 MED ORDER — SODIUM CHLORIDE 0.9% FLUSH
10.0000 mL | Freq: Once | INTRAVENOUS | Status: DC
  Filled 2020-10-17: qty 10

## 2020-10-17 MED ORDER — FLUOROURACIL CHEMO INJECTION 2.5 GM/50ML
400.0000 mg/m2 | Freq: Once | INTRAVENOUS | Status: DC
  Filled 2020-10-17: qty 14

## 2020-10-17 MED ORDER — NIVOLUMAB CHEMO INJECTION 100 MG/10ML
480.0000 mg | Freq: Once | INTRAVENOUS | Status: AC
  Administered 2020-10-17: 480 mg via INTRAVENOUS
  Filled 2020-10-17: qty 48

## 2020-10-17 MED ORDER — SODIUM CHLORIDE 0.9 % IV SOLN
Freq: Once | INTRAVENOUS | Status: AC
  Filled 2020-10-17: qty 250

## 2020-10-17 MED ORDER — ACETAMINOPHEN 325 MG PO TABS
650.0000 mg | ORAL_TABLET | Freq: Once | ORAL | Status: AC
  Administered 2020-10-17: 650 mg via ORAL

## 2020-10-17 MED ORDER — DIPHENHYDRAMINE HCL 25 MG PO CAPS
ORAL_CAPSULE | ORAL | Status: AC
  Filled 2020-10-17: qty 2

## 2020-10-17 MED ORDER — SODIUM CHLORIDE 0.9 % IV SOLN
10.0000 mg | Freq: Once | INTRAVENOUS | Status: AC
  Administered 2020-10-17: 10 mg via INTRAVENOUS
  Filled 2020-10-17: qty 10

## 2020-10-17 MED ORDER — PALONOSETRON HCL INJECTION 0.25 MG/5ML
INTRAVENOUS | Status: AC
  Filled 2020-10-17: qty 5

## 2020-10-17 MED ORDER — PALONOSETRON HCL INJECTION 0.25 MG/5ML
0.2500 mg | Freq: Once | INTRAVENOUS | Status: AC
  Administered 2020-10-17: 0.25 mg via INTRAVENOUS

## 2020-10-17 MED ORDER — SODIUM CHLORIDE 0.9% FLUSH
10.0000 mL | Freq: Once | INTRAVENOUS | Status: AC
  Administered 2020-10-17: 10 mL via INTRAVENOUS
  Filled 2020-10-17: qty 10

## 2020-10-17 MED ORDER — OXALIPLATIN CHEMO INJECTION 100 MG/20ML
85.0000 mg/m2 | Freq: Once | INTRAVENOUS | Status: AC
  Administered 2020-10-17: 150 mg via INTRAVENOUS
  Filled 2020-10-17: qty 20

## 2020-10-17 MED ORDER — SODIUM CHLORIDE 0.9 % IV SOLN
2160.0000 mg/m2 | INTRAVENOUS | Status: DC
  Administered 2020-10-17: 3800 mg via INTRAVENOUS
  Filled 2020-10-17: qty 76

## 2020-10-17 MED ORDER — DIPHENHYDRAMINE HCL 25 MG PO CAPS
50.0000 mg | ORAL_CAPSULE | Freq: Once | ORAL | Status: AC
  Administered 2020-10-17: 50 mg via ORAL

## 2020-10-17 MED ORDER — TRASTUZUMAB-DKST CHEMO 150 MG IV SOLR
4.0000 mg/kg | Freq: Once | INTRAVENOUS | Status: AC
  Administered 2020-10-17: 252 mg via INTRAVENOUS
  Filled 2020-10-17: qty 12

## 2020-10-17 NOTE — Patient Instructions (Signed)
Fluorouracil, 5-FU injection What is this medicine? FLUOROURACIL, 5-FU (flure oh YOOR a sil) is a chemotherapy drug. It slows the growth of cancer cells. This medicine is used to treat many types of cancer like breast cancer, colon or rectal cancer, pancreatic cancer, and stomach cancer. This medicine may be used for other purposes; ask your health care provider or pharmacist if you have questions. COMMON BRAND NAME(S): Adrucil What should I tell my health care provider before I take this medicine? They need to know if you have any of these conditions:  blood disorders  dihydropyrimidine dehydrogenase (DPD) deficiency  infection (especially a virus infection such as chickenpox, cold sores, or herpes)  kidney disease  liver disease  malnourished, poor nutrition  recent or ongoing radiation therapy  an unusual or allergic reaction to fluorouracil, other chemotherapy, other medicines, foods, dyes, or preservatives  pregnant or trying to get pregnant  breast-feeding How should I use this medicine? This drug is given as an infusion or injection into a vein. It is administered in a hospital or clinic by a specially trained health care professional. Talk to your pediatrician regarding the use of this medicine in children. Special care may be needed. Overdosage: If you think you have taken too much of this medicine contact a poison control center or emergency room at once. NOTE: This medicine is only for you. Do not share this medicine with others. What if I miss a dose? It is important not to miss your dose. Call your doctor or health care professional if you are unable to keep an appointment. What may interact with this medicine? Do not take this medicine with any of the following medications:  live virus vaccines This medicine may also interact with the following medications:  medicines that treat or prevent blood clots like warfarin, enoxaparin, and dalteparin This list may not  describe all possible interactions. Give your health care provider a list of all the medicines, herbs, non-prescription drugs, or dietary supplements you use. Also tell them if you smoke, drink alcohol, or use illegal drugs. Some items may interact with your medicine. What should I watch for while using this medicine? Visit your doctor for checks on your progress. This drug may make you feel generally unwell. This is not uncommon, as chemotherapy can affect healthy cells as well as cancer cells. Report any side effects. Continue your course of treatment even though you feel ill unless your doctor tells you to stop. In some cases, you may be given additional medicines to help with side effects. Follow all directions for their use. Call your doctor or health care professional for advice if you get a fever, chills or sore throat, or other symptoms of a cold or flu. Do not treat yourself. This drug decreases your body's ability to fight infections. Try to avoid being around people who are sick. This medicine may increase your risk to bruise or bleed. Call your doctor or health care professional if you notice any unusual bleeding. Be careful brushing and flossing your teeth or using a toothpick because you may get an infection or bleed more easily. If you have any dental work done, tell your dentist you are receiving this medicine. Avoid taking products that contain aspirin, acetaminophen, ibuprofen, naproxen, or ketoprofen unless instructed by your doctor. These medicines may hide a fever. Do not become pregnant while taking this medicine. Women should inform their doctor if they wish to become pregnant or think they might be pregnant. There is a potential   for serious side effects to an unborn child. Talk to your health care professional or pharmacist for more information. Do not breast-feed an infant while taking this medicine. Men should inform their doctor if they wish to father a child. This medicine may  lower sperm counts. Do not treat diarrhea with over the counter products. Contact your doctor if you have diarrhea that lasts more than 2 days or if it is severe and watery. This medicine can make you more sensitive to the sun. Keep out of the sun. If you cannot avoid being in the sun, wear protective clothing and use sunscreen. Do not use sun lamps or tanning beds/booths. What side effects may I notice from receiving this medicine? Side effects that you should report to your doctor or health care professional as soon as possible:  allergic reactions like skin rash, itching or hives, swelling of the face, lips, or tongue  low blood counts - this medicine may decrease the number of white blood cells, red blood cells and platelets. You may be at increased risk for infections and bleeding.  signs of infection - fever or chills, cough, sore throat, pain or difficulty passing urine  signs of decreased platelets or bleeding - bruising, pinpoint red spots on the skin, black, tarry stools, blood in the urine  signs of decreased red blood cells - unusually weak or tired, fainting spells, lightheadedness  breathing problems  changes in vision  chest pain  mouth sores  nausea and vomiting  pain, swelling, redness at site where injected  pain, tingling, numbness in the hands or feet  redness, swelling, or sores on hands or feet  stomach pain  unusual bleeding Side effects that usually do not require medical attention (report to your doctor or health care professional if they continue or are bothersome):  changes in finger or toe nails  diarrhea  dry or itchy skin  hair loss  headache  loss of appetite  sensitivity of eyes to the light  stomach upset  unusually teary eyes This list may not describe all possible side effects. Call your doctor for medical advice about side effects. You may report side effects to FDA at 1-800-FDA-1088. Where should I keep my medicine? This  drug is given in a hospital or clinic and will not be stored at home. NOTE: This sheet is a summary. It may not cover all possible information. If you have questions about this medicine, talk to your doctor, pharmacist, or health care provider.  2021 Elsevier/Gold Standard (2019-07-28 15:00:03) Leucovorin injection What is this medicine? LEUCOVORIN (loo koe VOR in) is used to prevent or treat the harmful effects of some medicines. This medicine is used to treat anemia caused by a low amount of folic acid in the body. It is also used with 5-fluorouracil (5-FU) to treat colon cancer. This medicine may be used for other purposes; ask your health care provider or pharmacist if you have questions. What should I tell my health care provider before I take this medicine? They need to know if you have any of these conditions:  anemia from low levels of vitamin B-12 in the blood  an unusual or allergic reaction to leucovorin, folic acid, other medicines, foods, dyes, or preservatives  pregnant or trying to get pregnant  breast-feeding How should I use this medicine? This medicine is for injection into a muscle or into a vein. It is given by a health care professional in a hospital or clinic setting. Talk to your pediatrician   regarding the use of this medicine in children. Special care may be needed. Overdosage: If you think you have taken too much of this medicine contact a poison control center or emergency room at once. NOTE: This medicine is only for you. Do not share this medicine with others. What if I miss a dose? This does not apply. What may interact with this medicine?  capecitabine  fluorouracil  phenobarbital  phenytoin  primidone  trimethoprim-sulfamethoxazole This list may not describe all possible interactions. Give your health care provider a list of all the medicines, herbs, non-prescription drugs, or dietary supplements you use. Also tell them if you smoke, drink alcohol,  or use illegal drugs. Some items may interact with your medicine. What should I watch for while using this medicine? Your condition will be monitored carefully while you are receiving this medicine. This medicine may increase the side effects of 5-fluorouracil, 5-FU. Tell your doctor or health care professional if you have diarrhea or mouth sores that do not get better or that get worse. What side effects may I notice from receiving this medicine? Side effects that you should report to your doctor or health care professional as soon as possible:  allergic reactions like skin rash, itching or hives, swelling of the face, lips, or tongue  breathing problems  fever, infection  mouth sores  unusual bleeding or bruising  unusually weak or tired Side effects that usually do not require medical attention (report to your doctor or health care professional if they continue or are bothersome):  constipation or diarrhea  loss of appetite  nausea, vomiting This list may not describe all possible side effects. Call your doctor for medical advice about side effects. You may report side effects to FDA at 1-800-FDA-1088. Where should I keep my medicine? This drug is given in a hospital or clinic and will not be stored at home. NOTE: This sheet is a summary. It may not cover all possible information. If you have questions about this medicine, talk to your doctor, pharmacist, or health care provider.  2021 Elsevier/Gold Standard (2008-03-02 16:50:29) Oxaliplatin Injection What is this medicine? OXALIPLATIN (ox AL i PLA tin) is a chemotherapy drug. It targets fast dividing cells, like cancer cells, and causes these cells to die. This medicine is used to treat cancers of the colon and rectum, and many other cancers. This medicine may be used for other purposes; ask your health care provider or pharmacist if you have questions. COMMON BRAND NAME(S): Eloxatin What should I tell my health care provider  before I take this medicine? They need to know if you have any of these conditions:  heart disease  history of irregular heartbeat  liver disease  low blood counts, like white cells, platelets, or red blood cells  lung or breathing disease, like asthma  take medicines that treat or prevent blood clots  tingling of the fingers or toes, or other nerve disorder  an unusual or allergic reaction to oxaliplatin, other chemotherapy, other medicines, foods, dyes, or preservatives  pregnant or trying to get pregnant  breast-feeding How should I use this medicine? This drug is given as an infusion into a vein. It is administered in a hospital or clinic by a specially trained health care professional. Talk to your pediatrician regarding the use of this medicine in children. Special care may be needed. Overdosage: If you think you have taken too much of this medicine contact a poison control center or emergency room at once. NOTE: This medicine   is only for you. Do not share this medicine with others. What if I miss a dose? It is important not to miss a dose. Call your doctor or health care professional if you are unable to keep an appointment. What may interact with this medicine? Do not take this medicine with any of the following medications:  cisapride  dronedarone  pimozide  thioridazine This medicine may also interact with the following medications:  aspirin and aspirin-like medicines  certain medicines that treat or prevent blood clots like warfarin, apixaban, dabigatran, and rivaroxaban  cisplatin  cyclosporine  diuretics  medicines for infection like acyclovir, adefovir, amphotericin B, bacitracin, cidofovir, foscarnet, ganciclovir, gentamicin, pentamidine, vancomycin  NSAIDs, medicines for pain and inflammation, like ibuprofen or naproxen  other medicines that prolong the QT interval (an abnormal heart rhythm)  pamidronate  zoledronic acid This list may not  describe all possible interactions. Give your health care provider a list of all the medicines, herbs, non-prescription drugs, or dietary supplements you use. Also tell them if you smoke, drink alcohol, or use illegal drugs. Some items may interact with your medicine. What should I watch for while using this medicine? Your condition will be monitored carefully while you are receiving this medicine. You may need blood work done while you are taking this medicine. This medicine may make you feel generally unwell. This is not uncommon as chemotherapy can affect healthy cells as well as cancer cells. Report any side effects. Continue your course of treatment even though you feel ill unless your healthcare professional tells you to stop. This medicine can make you more sensitive to cold. Do not drink cold drinks or use ice. Cover exposed skin before coming in contact with cold temperatures or cold objects. When out in cold weather wear warm clothing and cover your mouth and nose to warm the air that goes into your lungs. Tell your doctor if you get sensitive to the cold. Do not become pregnant while taking this medicine or for 9 months after stopping it. Women should inform their health care professional if they wish to become pregnant or think they might be pregnant. Men should not father a child while taking this medicine and for 6 months after stopping it. There is potential for serious side effects to an unborn child. Talk to your health care professional for more information. Do not breast-feed a child while taking this medicine or for 3 months after stopping it. This medicine has caused ovarian failure in some women. This medicine may make it more difficult to get pregnant. Talk to your health care professional if you are concerned about your fertility. This medicine has caused decreased sperm counts in some men. This may make it more difficult to father a child. Talk to your health care professional if  you are concerned about your fertility. This medicine may increase your risk of getting an infection. Call your health care professional for advice if you get a fever, chills, or sore throat, or other symptoms of a cold or flu. Do not treat yourself. Try to avoid being around people who are sick. Avoid taking medicines that contain aspirin, acetaminophen, ibuprofen, naproxen, or ketoprofen unless instructed by your health care professional. These medicines may hide a fever. Be careful brushing or flossing your teeth or using a toothpick because you may get an infection or bleed more easily. If you have any dental work done, tell your dentist you are receiving this medicine. What side effects may I notice from receiving   this medicine? Side effects that you should report to your doctor or health care professional as soon as possible:  allergic reactions like skin rash, itching or hives, swelling of the face, lips, or tongue  breathing problems  cough  low blood counts - this medicine may decrease the number of white blood cells, red blood cells, and platelets. You may be at increased risk for infections and bleeding  nausea, vomiting  pain, redness, or irritation at site where injected  pain, tingling, numbness in the hands or feet  signs and symptoms of bleeding such as bloody or black, tarry stools; red or dark brown urine; spitting up blood or brown material that looks like coffee grounds; red spots on the skin; unusual bruising or bleeding from the eyes, gums, or nose  signs and symptoms of a dangerous change in heartbeat or heart rhythm like chest pain; dizziness; fast, irregular heartbeat; palpitations; feeling faint or lightheaded; falls  signs and symptoms of infection like fever; chills; cough; sore throat; pain or trouble passing urine  signs and symptoms of liver injury like dark yellow or brown urine; general ill feeling or flu-like symptoms; light-colored stools; loss of  appetite; nausea; right upper belly pain; unusually weak or tired; yellowing of the eyes or skin  signs and symptoms of low red blood cells or anemia such as unusually weak or tired; feeling faint or lightheaded; falls  signs and symptoms of muscle injury like dark urine; trouble passing urine or change in the amount of urine; unusually weak or tired; muscle pain; back pain Side effects that usually do not require medical attention (report to your doctor or health care professional if they continue or are bothersome):  changes in taste  diarrhea  gas  hair loss  loss of appetite  mouth sores This list may not describe all possible side effects. Call your doctor for medical advice about side effects. You may report side effects to FDA at 1-800-FDA-1088. Where should I keep my medicine? This drug is given in a hospital or clinic and will not be stored at home. NOTE: This sheet is a summary. It may not cover all possible information. If you have questions about this medicine, talk to your doctor, pharmacist, or health care provider.  2021 Else[employer]/Gold Standard (2019-01-14 12:20:35) Nivolumab injection What is this medicine? NIVOLUMAB (nye VOL ue mab) is a monoclonal antibody. It treats certain types of cancer. Some of the cancers treated are colon cancer, head and neck cancer, Hodgkin lymphoma, lung cancer, and melanoma. This medicine may be used for other purposes; ask your health care provider or pharmacist if you have questions. COMMON BRAND NAME(S): Opdivo What should I tell my health care provider before I take this medicine? They need to know if you have any of these conditions:  autoimmune diseases like Crohn's disease, ulcerative colitis, or lupus  have had or planning to have an allogeneic stem cell transplant (uses someone else's stem cells)  history of chest radiation  history of organ transplant  nervous system problems like myasthenia gravis or Guillain-Barre  syndrome  an unusual or allergic reaction to nivolumab, other medicines, foods, dyes, or preservatives  pregnant or trying to get pregnant  breast-feeding How should I use this medicine? This medicine is for infusion into a vein. It is given by a health care professional in a hospital or clinic setting. A special MedGuide will be given to you before each treatment. Be sure to read this information carefully each time. Talk to   your pediatrician regarding the use of this medicine in children. While this drug may be prescribed for children as Amante as 12 years for selected conditions, precautions do apply. Overdosage: If you think you have taken too much of this medicine contact a poison control center or emergency room at once. NOTE: This medicine is only for you. Do not share this medicine with others. What if I miss a dose? It is important not to miss your dose. Call your doctor or health care professional if you are unable to keep an appointment. What may interact with this medicine? Interactions have not been studied. This list may not describe all possible interactions. Give your health care provider a list of all the medicines, herbs, non-prescription drugs, or dietary supplements you use. Also tell them if you smoke, drink alcohol, or use illegal drugs. Some items may interact with your medicine. What should I watch for while using this medicine? This drug may make you feel generally unwell. Continue your course of treatment even though you feel ill unless your doctor tells you to stop. You may need blood work done while you are taking this medicine. Do not become pregnant while taking this medicine or for 5 months after stopping it. Women should inform their doctor if they wish to become pregnant or think they might be pregnant. There is a potential for serious side effects to an unborn child. Talk to your health care professional or pharmacist for more information. Do not breast-feed an  infant while taking this medicine or for 5 months after stopping it. What side effects may I notice from receiving this medicine? Side effects that you should report to your doctor or health care professional as soon as possible:  allergic reactions like skin rash, itching or hives, swelling of the face, lips, or tongue  breathing problems  blood in the urine  bloody or watery diarrhea or black, tarry stools  changes in emotions or moods  changes in vision  chest pain  cough  dizziness  feeling faint or lightheaded, falls  fever, chills  headache with fever, neck stiffness, confusion, loss of memory, sensitivity to light, hallucination, loss of contact with reality, or seizures  joint pain  mouth sores  redness, blistering, peeling or loosening of the skin, including inside the mouth  severe muscle pain or weakness  signs and symptoms of high blood sugar such as dizziness; dry mouth; dry skin; fruity breath; nausea; stomach pain; increased hunger or thirst; increased urination  signs and symptoms of kidney injury like trouble passing urine or change in the amount of urine  signs and symptoms of liver injury like dark yellow or brown urine; general ill feeling or flu-like symptoms; light-colored stools; loss of appetite; nausea; right upper belly pain; unusually weak or tired; yellowing of the eyes or skin  swelling of the ankles, feet, hands  trouble passing urine or change in the amount of urine  unusually weak or tired  weight gain or loss Side effects that usually do not require medical attention (report to your doctor or health care professional if they continue or are bothersome):  bone pain  constipation  decreased appetite  diarrhea  muscle pain  nausea, vomiting  tiredness This list may not describe all possible side effects. Call your doctor for medical advice about side effects. You may report side effects to FDA at 1-800-FDA-1088. Where  should I keep my medicine? This drug is given in a hospital or clinic and will not be   stored at home. NOTE: This sheet is a summary. It may not cover all possible information. If you have questions about this medicine, talk to your doctor, pharmacist, or health care provider.  2021 [employer]/Gold Standard (2019-12-30 10:08:25) Trastuzumab injection for infusion What is this medicine? TRASTUZUMAB (tras TOO zoo mab) is a monoclonal antibody. It is used to treat breast cancer and stomach cancer. This medicine may be used for other purposes; ask your health care provider or pharmacist if you have questions. COMMON BRAND NAME(S): Herceptin, Herzuma, KANJINTI, Ogivri, Ontruzant, Trazimera What should I tell my health care provider before I take this medicine? They need to know if you have any of these conditions:  heart disease  heart failure  lung or breathing disease, like asthma  an unusual or allergic reaction to trastuzumab, benzyl alcohol, or other medications, foods, dyes, or preservatives  pregnant or trying to get pregnant  breast-feeding How should I use this medicine? This drug is given as an infusion into a vein. It is administered in a hospital or clinic by a specially trained health care professional. Talk to your pediatrician regarding the use of this medicine in children. This medicine is not approved for use in children. Overdosage: If you think you have taken too much of this medicine contact a poison control center or emergency room at once. NOTE: This medicine is only for you. Do not share this medicine with others. What if I miss a dose? It is important not to miss a dose. Call your doctor or health care professional if you are unable to keep an appointment. What may interact with this medicine? This medicine may interact with the following medications:  certain types of chemotherapy, such as daunorubicin, doxorubicin, epirubicin, and idarubicin This list may not  describe all possible interactions. Give your health care provider a list of all the medicines, herbs, non-prescription drugs, or dietary supplements you use. Also tell them if you smoke, drink alcohol, or use illegal drugs. Some items may interact with your medicine. What should I watch for while using this medicine? Visit your doctor for checks on your progress. Report any side effects. Continue your course of treatment even though you feel ill unless your doctor tells you to stop. Call your doctor or health care professional for advice if you get a fever, chills or sore throat, or other symptoms of a cold or flu. Do not treat yourself. Try to avoid being around people who are sick. You may experience fever, chills and shaking during your first infusion. These effects are usually mild and can be treated with other medicines. Report any side effects during the infusion to your health care professional. Fever and chills usually do not happen with later infusions. Do not become pregnant while taking this medicine or for 7 months after stopping it. Women should inform their doctor if they wish to become pregnant or think they might be pregnant. Women of child-bearing potential will need to have a negative pregnancy test before starting this medicine. There is a potential for serious side effects to an unborn child. Talk to your health care professional or pharmacist for more information. Do not breast-feed an infant while taking this medicine or for 7 months after stopping it. Women must use effective birth control with this medicine. What side effects may I notice from receiving this medicine? Side effects that you should report to your doctor or health care professional as soon as possible:  allergic reactions like skin rash, itching or hives,   swelling of the face, lips, or tongue  chest pain or palpitations  cough  dizziness  feeling faint or lightheaded, falls  fever  general ill feeling or  flu-like symptoms  signs of worsening heart failure like breathing problems; swelling in your legs and feet  unusually weak or tired Side effects that usually do not require medical attention (report to your doctor or health care professional if they continue or are bothersome):  bone pain  changes in taste  diarrhea  joint pain  nausea/vomiting  weight loss This list may not describe all possible side effects. Call your doctor for medical advice about side effects. You may report side effects to FDA at 1-800-FDA-1088. Where should I keep my medicine? This drug is given in a hospital or clinic and will not be stored at home. NOTE: This sheet is a summary. It may not cover all possible information. If you have questions about this medicine, talk to your doctor, pharmacist, or health care provider.  2021 Elsevier/Gold Standard (2016-08-21 14:37:52)  

## 2020-10-17 NOTE — Progress Notes (Signed)
Hematology and Oncology Follow Up Visit  Malik Paar [id number] 05/30/1951 70 y.o. 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 Tibbitts is back for his follow-up.  He comes in with his daughter and wife.  So far, he seems to be doing pretty well.  I think he is tolerated treatment okay.  I still like to see him eat a little bit more.  His protein level is down.  His albumin is 3.3.  I told him to try to take him more protein in his diet.  His blood sugar is up a little bit.  This really does not bother me all that much.  He has had a little bit of abdominal discomfort.  There is no change in bowel or bladder habits.  He has had no melena or bright red blood per rectum.  There is been no cough or shortness of breath.  He initially had a temperature when he came in.  We repeated this and the temperature was 97.9.  He has had no rashes.  There has been no leg swelling.  He has had no problems urinating.  There is been no mouth sores.  Overall, his performance status is ECOG 1.   Medications:  Current Outpatient Medications:  .  atorvastatin (LIPITOR) 80 MG tablet, Take 1 tablet (80 mg total) by mouth daily., Disp: 90 tablet, Rfl: 3 .  Cyanocobalamin (VITAMIN B-12 PO), Take 1 tablet by mouth daily., Disp: , Rfl:  .  dexamethasone (DECADRON) 4 MG tablet, Take 2 tablets (8 mg total) by mouth daily. Start the day after chemotherapy for 2 days. Take with food., Disp: 30 tablet, Rfl: 1 .  feeding supplement (ENSURE ENLIVE / ENSURE PLUS) LIQD, Take 237 mLs by mouth 2 (two) times daily between meals., Disp: 237 mL, Rfl: 12 .  gabapentin (NEURONTIN) 300 MG capsule, Take 300 mg by mouth daily., Disp: , Rfl:  .   glimepiride (AMARYL) 2 MG tablet, Take 1 tablet (2 mg total) by mouth daily with breakfast., Disp: 30 tablet, Rfl: 0 .  lidocaine-prilocaine (EMLA) cream, Apply to affected area once, Disp: 30 g, Rfl: 3 .  lisinopril (ZESTRIL) 2.5 MG tablet, Take 2.5 mg by mouth daily., Disp: , Rfl:  .  metFORMIN (GLUCOPHAGE) 1000 MG tablet, Take by mouth., Disp: , Rfl:  .  metoprolol tartrate (LOPRESSOR) 50 MG tablet, Take 1 tablet (50 mg total) by mouth 2 (two) times daily., Disp: 180 tablet, Rfl: 1 .  ondansetron (ZOFRAN) 8 MG tablet, Take 1 tablet (8 mg total) by mouth 2 (two) times daily as needed for refractory nausea / vomiting. Start on day 3 after chemotherapy., Disp: 30 tablet, Rfl: 1 .  pantoprazole (PROTONIX) 40 MG tablet, Take 1 tablet (40 mg total) by mouth 2 (two) times daily., Disp: 60 tablet, Rfl: 1 .  prochlorperazine (COMPAZINE) 10 MG tablet, Take 1 tablet (10 mg total) by mouth every 6 (six) hours as needed (Nausea or vomiting)., Disp: 30 tablet, Rfl: 1  Allergies: No Known Allergies  Past Medical History, Surgical history, Social history, and Family History were reviewed and updated.  Review of Systems: Review of Systems  Constitutional: Negative.   HENT:  Negative.   Eyes: Negative.  Respiratory: Negative.   Cardiovascular: Negative.   Endocrine: Negative.   Genitourinary: Negative.    Musculoskeletal: Negative.   Skin: Negative.   Neurological: Negative.   Hematological: Negative.   Psychiatric/Behavioral: Negative.     Physical Exam:  vitals were not taken for this visit.   Wt Readings from Last 3 Encounters:  10/03/20 147 lb (66.7 kg)  09/16/20 140 lb (63.5 kg)  08/22/20 143 lb (64.9 kg)    Physical Exam Vitals reviewed.  HENT:     Head: Normocephalic and atraumatic.  Eyes:     Pupils: Pupils are equal, round, and reactive to light.  Cardiovascular:     Rate and Rhythm: Normal rate and regular rhythm.     Heart sounds: Normal heart sounds.  Pulmonary:      Effort: Pulmonary effort is normal.     Breath sounds: Normal breath sounds.  Abdominal:     General: Bowel sounds are normal.     Palpations: Abdomen is soft.  Musculoskeletal:        General: No tenderness or deformity. Normal range of motion.     Cervical back: Normal range of motion.  Lymphadenopathy:     Cervical: No cervical adenopathy.  Skin:    General: Skin is warm and dry.     Findings: No erythema or rash.  Neurological:     Mental Status: He is alert and oriented to person, place, and time.  Psychiatric:        Behavior: Behavior normal.        Thought Content: Thought content normal.        Judgment: Judgment normal.    Lab Results  Component Value Date   WBC 4.0 10/17/2020   HGB 9.5 (L) 10/17/2020   HCT 29.5 (L) 10/17/2020   MCV 80.6 10/17/2020   PLT 197 10/17/2020     Chemistry      Component Value Date/Time   NA 134 (L) 10/03/2020 0810   NA 140 06/10/2020 0935   K 3.6 10/03/2020 0810   CL 100 10/03/2020 0810   CO2 26 10/03/2020 0810   BUN 17 10/03/2020 0810   BUN 17 06/10/2020 0935   CREATININE 1.04 10/03/2020 0810      Component Value Date/Time   CALCIUM 8.7 (L) 10/03/2020 0810   ALKPHOS 53 10/03/2020 0810   AST 12 (L) 10/03/2020 0810   ALT 14 10/03/2020 0810   BILITOT 0.4 10/03/2020 0810      Impression and Plan: Mr. Vanderhoof is a very nice 70 year old [name] male.  He has metastatic adenocarcinoma of the stomach.  He has had 2 cycles of chemotherapy along with immunotherapy and targeted therapy.    I do believe that he is responding.  I realize that is still might be a bit early to know if there is a response.  Again, I had his family tell him that we really have to get more protein into him.  We will see what his iron levels look like.  We will go ahead with his third cycle of treatment.  Again, after the fourth cycle we will repeat our scans.   We will plan to get him back in 2 weeks for his fourth cycle of treatment.    Volanda Napoleon, MD 2/7/20229:19 AM

## 2020-10-17 NOTE — Progress Notes (Signed)
Ok to treat with ANC of 1.3 per DR Marin Olp. dph

## 2020-10-17 NOTE — Progress Notes (Signed)
Pt. Is accompanied by [facility], interpreter from [employer]. Daughter came in for the visit with Dr. Marin Olp.

## 2020-10-17 NOTE — Telephone Encounter (Signed)
appts made per 10/17/20 los and pt will rec sch in tx/avs     Jolee Critcher

## 2020-10-17 NOTE — Patient Instructions (Signed)
Tunneled Central Venous Catheter Flushing Guide  It is important to flush your tunneled central venous catheter each time you use it, both before and after you use it. Flushing your catheter will help prevent it from clogging. What are the risks? Risks may include:  Infection.  Air getting into the catheter and bloodstream. Supplies needed:  A clean pair of gloves.  A disinfecting wipe. Use an alcohol wipe, chlorhexidine wipe, or iodine wipe as told by your health care provider.  A 10 mL syringe that has been prefilled with saline solution.  An empty 10 mL syringe, if a substance called heparin was injected into your catheter. How to flush your catheter When you flush your catheter, make sure you follow any specific instructions from your health care provider or the manufacturer. These are general guidelines. Flushing your catheter before use If there is heparin in your catheter: 1. Wash your hands with soap and water. 2. Put on gloves. 3. Scrub the injection cap for a minimum of 15 seconds with a disinfecting wipe. 4. Unclamp the catheter. 5. Attach the empty syringe to the injection cap. 6. Pull the syringe plunger back and withdraw 10 mL of blood. 7. Place the syringe into an appropriate waste container. 8. Scrub the injection cap for 15 seconds with a disinfecting wipe. 9. Attach the prefilled syringe to the injection cap. 10. Flush the catheter by pushing the plunger forward until all the liquid from the syringe is in the catheter. 11. Remove the syringe from the injection cap. 12. Clamp the catheter. If there is no heparin in your catheter: 1. Wash your hands with soap and water. 2. Put on gloves. 3. Scrub the injection cap for 15 seconds with a disinfecting wipe. 4. Unclamp the catheter. 5. Attach the prefilled syringe to the injection cap. 6. Flush the catheter by pushing the plunger forward until 5 mL of the liquid from the syringe is in the catheter. 7. Pull back on  the syringe until you see blood in the catheter. 8. If you have been asked to collect any blood, follow your health care provider's instructions. Otherwise, flush the catheter with the rest of the solution from the syringe. 9. Remove the syringe from the injection cap. 10. Clamp the catheter.   Flushing your catheter after use 1. Wash your hands with soap and water. 2. Put on gloves. 3. Scrub the injection cap for 15 seconds with a disinfecting wipe. 4. Unclamp the catheter. 5. Attach the prefilled syringe to the injection cap. 6. Flush the catheter by pushing the plunger forward until all of the liquid from the syringe is in the catheter. 7. Remove the syringe from the injection cap. 8. Clamp the catheter. Problems and solutions  If blood cannot be completely cleared from the injection cap, you may need to have the injection cap replaced.  If the catheter is difficult to flush, use the pulsing method. The pulsing method involves pushing only a few milliliters of solution into the catheter at a time and pausing between pushes.  If you do not see blood in the catheter when you pull back on the syringe, change your body position, such as by raising your arms above your head. Take a deep breath and cough. Then, pull back on the syringe. If you still do not see blood, flush the catheter with a small amount of solution. Then, change positions again and take a breath or cough. Pull back on the syringe again. If you still do not   see blood, finish flushing the catheter and contact your health care provider. Do not use your catheter until your health care provider says it is okay. General tips  Have someone help you flush your catheter, if possible.  Do not force fluid through your catheter.  Do not use a syringe that is larger or smaller than 10 mL. Using a smaller syringe can make the catheter burst.  Do not use your catheter without flushing it first if it has heparin in it. Contact a health  care provider if:  You cannot see any blood in the catheter when you flush it before using it.  Your catheter is difficult to flush. Get help right away if:  You cannot flush the catheter.  The catheter leaks when you flush it or when there is fluid in it.  There are cracks or breaks in the catheter. Summary  It is important to flush your tunneled central venous catheter each time you use it, both before and after you use it.  Scrub the injection cap for 15 seconds with a disinfecting wipe before and after you flush it.  When you flush your catheter, make sure you follow any specific instructions from your health care provider or the manufacturer.  Get help right away if you cannot flush the catheter. This information is not intended to replace advice given to you by your health care provider. Make sure you discuss any questions you have with your health care provider. Document Revised: 11/05/2019 Document Reviewed: 11/12/2018 Elsevier Patient Education  2021 Elsevier Inc.  

## 2020-10-18 ENCOUNTER — Other Ambulatory Visit: Payer: Self-pay | Admitting: *Deleted

## 2020-10-18 ENCOUNTER — Encounter: Payer: Self-pay | Admitting: *Deleted

## 2020-10-18 LAB — T4: T4, Total: 6 ug/dL (ref 4.5–12.0)

## 2020-10-18 MED ORDER — PANTOPRAZOLE SODIUM 40 MG PO TBEC: 40.0000 mg | DELAYED_RELEASE_TABLET | Freq: Two times a day (BID) | ORAL | 1 refills | Status: DC

## 2020-10-18 NOTE — Progress Notes (Signed)
Oncology Nurse Navigator Documentation  Oncology Nurse Navigator Flowsheets 10/18/2020  Abnormal Finding Date -  Diagnosis Status -  Planned Course of Treatment -  Phase of Treatment -  Chemotherapy Pending- Reason: -  Chemotherapy Actual Start Date: -  Navigator Follow Up Date: 10/31/2020  Navigator Follow Up Reason: Follow-up Appointment;Chemotherapy  Navigator Location [facility]  Referral Date to RadOnc/MedOnc -  Navigator Encounter Type Appt/Treatment Plan Review  Telephone -  Patient Visit Type MedOnc  Treatment Phase Active Tx  Barriers/Navigation Needs Coordination of Care;Education;Language/Communication  Education -  Interventions None Required  Acuity Level 3-Moderate Needs (3-4 Barriers Identified)  Coordination of Care -  Education Method -  Support Groups/Services Friends and Family  Time Spent with Patient 15

## 2020-10-19 ENCOUNTER — Other Ambulatory Visit: Payer: Self-pay

## 2020-10-19 ENCOUNTER — Inpatient Hospital Stay: Payer: Medicare Other

## 2020-10-19 VITALS — BP 128/75 | HR 73 | Temp 97.8°F | Resp 18

## 2020-10-19 DIAGNOSIS — C165 Malignant neoplasm of lesser curvature of stomach, unspecified: Secondary | ICD-10-CM

## 2020-10-19 DIAGNOSIS — Z5112 Encounter for antineoplastic immunotherapy: Secondary | ICD-10-CM | POA: Diagnosis not present

## 2020-10-19 DIAGNOSIS — C169 Malignant neoplasm of stomach, unspecified: Secondary | ICD-10-CM

## 2020-10-19 MED ORDER — SODIUM CHLORIDE 0.9% FLUSH
10.0000 mL | INTRAVENOUS | Status: DC | PRN
  Administered 2020-10-19: 10 mL
  Filled 2020-10-19: qty 10

## 2020-10-19 MED ORDER — HEPARIN SOD (PORK) LOCK FLUSH 100 UNIT/ML IV SOLN
500.0000 [IU] | Freq: Once | INTRAVENOUS | Status: AC | PRN
  Administered 2020-10-19: 500 [IU]
  Filled 2020-10-19: qty 5

## 2020-10-19 NOTE — Patient Instructions (Signed)
Fluorouracil, 5-FU injection What is this medicine? FLUOROURACIL, 5-FU (flure oh YOOR a sil) is a chemotherapy drug. It slows the growth of cancer cells. This medicine is used to treat many types of cancer like breast cancer, colon or rectal cancer, pancreatic cancer, and stomach cancer. This medicine may be used for other purposes; ask your health care provider or pharmacist if you have questions. COMMON BRAND NAME(S): Adrucil What should I tell my health care provider before I take this medicine? They need to know if you have any of these conditions:  blood disorders  dihydropyrimidine dehydrogenase (DPD) deficiency  infection (especially a virus infection such as chickenpox, cold sores, or herpes)  kidney disease  liver disease  malnourished, poor nutrition  recent or ongoing radiation therapy  an unusual or allergic reaction to fluorouracil, other chemotherapy, other medicines, foods, dyes, or preservatives  pregnant or trying to get pregnant  breast-feeding How should I use this medicine? This drug is given as an infusion or injection into a vein. It is administered in a hospital or clinic by a specially trained health care professional. Talk to your pediatrician regarding the use of this medicine in children. Special care may be needed. Overdosage: If you think you have taken too much of this medicine contact a poison control center or emergency room at once. NOTE: This medicine is only for you. Do not share this medicine with others. What if I miss a dose? It is important not to miss your dose. Call your doctor or health care professional if you are unable to keep an appointment. What may interact with this medicine? Do not take this medicine with any of the following medications:  live virus vaccines This medicine may also interact with the following medications:  medicines that treat or prevent blood clots like warfarin, enoxaparin, and dalteparin This list may not  describe all possible interactions. Give your health care provider a list of all the medicines, herbs, non-prescription drugs, or dietary supplements you use. Also tell them if you smoke, drink alcohol, or use illegal drugs. Some items may interact with your medicine. What should I watch for while using this medicine? Visit your doctor for checks on your progress. This drug may make you feel generally unwell. This is not uncommon, as chemotherapy can affect healthy cells as well as cancer cells. Report any side effects. Continue your course of treatment even though you feel ill unless your doctor tells you to stop. In some cases, you may be given additional medicines to help with side effects. Follow all directions for their use. Call your doctor or health care professional for advice if you get a fever, chills or sore throat, or other symptoms of a cold or flu. Do not treat yourself. This drug decreases your body's ability to fight infections. Try to avoid being around people who are sick. This medicine may increase your risk to bruise or bleed. Call your doctor or health care professional if you notice any unusual bleeding. Be careful brushing and flossing your teeth or using a toothpick because you may get an infection or bleed more easily. If you have any dental work done, tell your dentist you are receiving this medicine. Avoid taking products that contain aspirin, acetaminophen, ibuprofen, naproxen, or ketoprofen unless instructed by your doctor. These medicines may hide a fever. Do not become pregnant while taking this medicine. Women should inform their doctor if they wish to become pregnant or think they might be pregnant. There is a potential   for serious side effects to an unborn child. Talk to your health care professional or pharmacist for more information. Do not breast-feed an infant while taking this medicine. Men should inform their doctor if they wish to father a child. This medicine may  lower sperm counts. Do not treat diarrhea with over the counter products. Contact your doctor if you have diarrhea that lasts more than 2 days or if it is severe and watery. This medicine can make you more sensitive to the sun. Keep out of the sun. If you cannot avoid being in the sun, wear protective clothing and use sunscreen. Do not use sun lamps or tanning beds/booths. What side effects may I notice from receiving this medicine? Side effects that you should report to your doctor or health care professional as soon as possible:  allergic reactions like skin rash, itching or hives, swelling of the face, lips, or tongue  low blood counts - this medicine may decrease the number of white blood cells, red blood cells and platelets. You may be at increased risk for infections and bleeding.  signs of infection - fever or chills, cough, sore throat, pain or difficulty passing urine  signs of decreased platelets or bleeding - bruising, pinpoint red spots on the skin, black, tarry stools, blood in the urine  signs of decreased red blood cells - unusually weak or tired, fainting spells, lightheadedness  breathing problems  changes in vision  chest pain  mouth sores  nausea and vomiting  pain, swelling, redness at site where injected  pain, tingling, numbness in the hands or feet  redness, swelling, or sores on hands or feet  stomach pain  unusual bleeding Side effects that usually do not require medical attention (report to your doctor or health care professional if they continue or are bothersome):  changes in finger or toe nails  diarrhea  dry or itchy skin  hair loss  headache  loss of appetite  sensitivity of eyes to the light  stomach upset  unusually teary eyes This list may not describe all possible side effects. Call your doctor for medical advice about side effects. You may report side effects to FDA at 1-800-FDA-1088. Where should I keep my medicine? This  drug is given in a hospital or clinic and will not be stored at home. NOTE: This sheet is a summary. It may not cover all possible information. If you have questions about this medicine, talk to your doctor, pharmacist, or health care provider.  2021 [employer] Standard (2019-07-28 15:00:03) Leucovorin injection What is this medicine? LEUCOVORIN (loo koe VOR in) is used to prevent or treat the harmful effects of some medicines. This medicine is used to treat anemia caused by a low amount of folic acid in the body. It is also used with 5-fluorouracil (5-FU) to treat colon cancer. This medicine may be used for other purposes; ask your health care provider or pharmacist if you have questions. What should I tell my health care provider before I take this medicine? They need to know if you have any of these conditions:  anemia from low levels of vitamin B-12 in the blood  an unusual or allergic reaction to leucovorin, folic acid, other medicines, foods, dyes, or preservatives  pregnant or trying to get pregnant  breast-feeding How should I use this medicine? This medicine is for injection into a muscle or into a vein. It is given by a health care professional in a hospital or clinic setting. Talk to your pediatrician   regarding the use of this medicine in children. Special care may be needed. Overdosage: If you think you have taken too much of this medicine contact a poison control center or emergency room at once. NOTE: This medicine is only for you. Do not share this medicine with others. What if I miss a dose? This does not apply. What may interact with this medicine?  capecitabine  fluorouracil  phenobarbital  phenytoin  primidone  trimethoprim-sulfamethoxazole This list may not describe all possible interactions. Give your health care provider a list of all the medicines, herbs, non-prescription drugs, or dietary supplements you use. Also tell them if you smoke, drink alcohol,  or use illegal drugs. Some items may interact with your medicine. What should I watch for while using this medicine? Your condition will be monitored carefully while you are receiving this medicine. This medicine may increase the side effects of 5-fluorouracil, 5-FU. Tell your doctor or health care professional if you have diarrhea or mouth sores that do not get better or that get worse. What side effects may I notice from receiving this medicine? Side effects that you should report to your doctor or health care professional as soon as possible:  allergic reactions like skin rash, itching or hives, swelling of the face, lips, or tongue  breathing problems  fever, infection  mouth sores  unusual bleeding or bruising  unusually weak or tired Side effects that usually do not require medical attention (report to your doctor or health care professional if they continue or are bothersome):  constipation or diarrhea  loss of appetite  nausea, vomiting This list may not describe all possible side effects. Call your doctor for medical advice about side effects. You may report side effects to FDA at 1-800-FDA-1088. Where should I keep my medicine? This drug is given in a hospital or clinic and will not be stored at home. NOTE: This sheet is a summary. It may not cover all possible information. If you have questions about this medicine, talk to your doctor, pharmacist, or health care provider.  2021 Else[employer]/Gold Standard (2008-03-02 16:50:29) Oxaliplatin Injection What is this medicine? OXALIPLATIN (ox AL i PLA tin) is a chemotherapy drug. It targets fast dividing cells, like cancer cells, and causes these cells to die. This medicine is used to treat cancers of the colon and rectum, and many other cancers. This medicine may be used for other purposes; ask your health care provider or pharmacist if you have questions. COMMON BRAND NAME(S): Eloxatin What should I tell my health care provider  before I take this medicine? They need to know if you have any of these conditions:  heart disease  history of irregular heartbeat  liver disease  low blood counts, like white cells, platelets, or red blood cells  lung or breathing disease, like asthma  take medicines that treat or prevent blood clots  tingling of the fingers or toes, or other nerve disorder  an unusual or allergic reaction to oxaliplatin, other chemotherapy, other medicines, foods, dyes, or preservatives  pregnant or trying to get pregnant  breast-feeding How should I use this medicine? This drug is given as an infusion into a vein. It is administered in a hospital or clinic by a specially trained health care professional. Talk to your pediatrician regarding the use of this medicine in children. Special care may be needed. Overdosage: If you think you have taken too much of this medicine contact a poison control center or emergency room at once. NOTE: This medicine   is only for you. Do not share this medicine with others. What if I miss a dose? It is important not to miss a dose. Call your doctor or health care professional if you are unable to keep an appointment. What may interact with this medicine? Do not take this medicine with any of the following medications:  cisapride  dronedarone  pimozide  thioridazine This medicine may also interact with the following medications:  aspirin and aspirin-like medicines  certain medicines that treat or prevent blood clots like warfarin, apixaban, dabigatran, and rivaroxaban  cisplatin  cyclosporine  diuretics  medicines for infection like acyclovir, adefovir, amphotericin B, bacitracin, cidofovir, foscarnet, ganciclovir, gentamicin, pentamidine, vancomycin  NSAIDs, medicines for pain and inflammation, like ibuprofen or naproxen  other medicines that prolong the QT interval (an abnormal heart rhythm)  pamidronate  zoledronic acid This list may not  describe all possible interactions. Give your health care provider a list of all the medicines, herbs, non-prescription drugs, or dietary supplements you use. Also tell them if you smoke, drink alcohol, or use illegal drugs. Some items may interact with your medicine. What should I watch for while using this medicine? Your condition will be monitored carefully while you are receiving this medicine. You may need blood work done while you are taking this medicine. This medicine may make you feel generally unwell. This is not uncommon as chemotherapy can affect healthy cells as well as cancer cells. Report any side effects. Continue your course of treatment even though you feel ill unless your healthcare professional tells you to stop. This medicine can make you more sensitive to cold. Do not drink cold drinks or use ice. Cover exposed skin before coming in contact with cold temperatures or cold objects. When out in cold weather wear warm clothing and cover your mouth and nose to warm the air that goes into your lungs. Tell your doctor if you get sensitive to the cold. Do not become pregnant while taking this medicine or for 9 months after stopping it. Women should inform their health care professional if they wish to become pregnant or think they might be pregnant. Men should not father a child while taking this medicine and for 6 months after stopping it. There is potential for serious side effects to an unborn child. Talk to your health care professional for more information. Do not breast-feed a child while taking this medicine or for 3 months after stopping it. This medicine has caused ovarian failure in some women. This medicine may make it more difficult to get pregnant. Talk to your health care professional if you are concerned about your fertility. This medicine has caused decreased sperm counts in some men. This may make it more difficult to father a child. Talk to your health care professional if  you are concerned about your fertility. This medicine may increase your risk of getting an infection. Call your health care professional for advice if you get a fever, chills, or sore throat, or other symptoms of a cold or flu. Do not treat yourself. Try to avoid being around people who are sick. Avoid taking medicines that contain aspirin, acetaminophen, ibuprofen, naproxen, or ketoprofen unless instructed by your health care professional. These medicines may hide a fever. Be careful brushing or flossing your teeth or using a toothpick because you may get an infection or bleed more easily. If you have any dental work done, tell your dentist you are receiving this medicine. What side effects may I notice from receiving   this medicine? Side effects that you should report to your doctor or health care professional as soon as possible:  allergic reactions like skin rash, itching or hives, swelling of the face, lips, or tongue  breathing problems  cough  low blood counts - this medicine may decrease the number of white blood cells, red blood cells, and platelets. You may be at increased risk for infections and bleeding  nausea, vomiting  pain, redness, or irritation at site where injected  pain, tingling, numbness in the hands or feet  signs and symptoms of bleeding such as bloody or black, tarry stools; red or dark brown urine; spitting up blood or brown material that looks like coffee grounds; red spots on the skin; unusual bruising or bleeding from the eyes, gums, or nose  signs and symptoms of a dangerous change in heartbeat or heart rhythm like chest pain; dizziness; fast, irregular heartbeat; palpitations; feeling faint or lightheaded; falls  signs and symptoms of infection like fever; chills; cough; sore throat; pain or trouble passing urine  signs and symptoms of liver injury like dark yellow or brown urine; general ill feeling or flu-like symptoms; light-colored stools; loss of  appetite; nausea; right upper belly pain; unusually weak or tired; yellowing of the eyes or skin  signs and symptoms of low red blood cells or anemia such as unusually weak or tired; feeling faint or lightheaded; falls  signs and symptoms of muscle injury like dark urine; trouble passing urine or change in the amount of urine; unusually weak or tired; muscle pain; back pain Side effects that usually do not require medical attention (report to your doctor or health care professional if they continue or are bothersome):  changes in taste  diarrhea  gas  hair loss  loss of appetite  mouth sores This list may not describe all possible side effects. Call your doctor for medical advice about side effects. You may report side effects to FDA at 1-800-FDA-1088. Where should I keep my medicine? This drug is given in a hospital or clinic and will not be stored at home. NOTE: This sheet is a summary. It may not cover all possible information. If you have questions about this medicine, talk to your doctor, pharmacist, or health care provider.  2021 [employer] Standard (2019-01-14 12:20:35) Nivolumab injection What is this medicine? NIVOLUMAB (nye VOL ue mab) is a monoclonal antibody. It treats certain types of cancer. Some of the cancers treated are colon cancer, head and neck cancer, Hodgkin lymphoma, lung cancer, and melanoma. This medicine may be used for other purposes; ask your health care provider or pharmacist if you have questions. COMMON BRAND NAME(S): Opdivo What should I tell my health care provider before I take this medicine? They need to know if you have any of these conditions:  autoimmune diseases like Crohn's disease, ulcerative colitis, or lupus  have had or planning to have an allogeneic stem cell transplant (uses someone else's stem cells)  history of chest radiation  history of organ transplant  nervous system problems like myasthenia gravis or Guillain-Barre  syndrome  an unusual or allergic reaction to nivolumab, other medicines, foods, dyes, or preservatives  pregnant or trying to get pregnant  breast-feeding How should I use this medicine? This medicine is for infusion into a vein. It is given by a health care professional in a hospital or clinic setting. A special MedGuide will be given to you before each treatment. Be sure to read this information carefully each time. Talk to  your pediatrician regarding the use of this medicine in children. While this drug may be prescribed for children as [place] as 12 years for selected conditions, precautions do apply. Overdosage: If you think you have taken too much of this medicine contact a poison control center or emergency room at once. NOTE: This medicine is only for you. Do not share this medicine with others. What if I miss a dose? It is important not to miss your dose. Call your doctor or health care professional if you are unable to keep an appointment. What may interact with this medicine? Interactions have not been studied. This list may not describe all possible interactions. Give your health care provider a list of all the medicines, herbs, non-prescription drugs, or dietary supplements you use. Also tell them if you smoke, drink alcohol, or use illegal drugs. Some items may interact with your medicine. What should I watch for while using this medicine? This drug may make you feel generally unwell. Continue your course of treatment even though you feel ill unless your doctor tells you to stop. You may need blood work done while you are taking this medicine. Do not become pregnant while taking this medicine or for 5 months after stopping it. Women should inform their doctor if they wish to become pregnant or think they might be pregnant. There is a potential for serious side effects to an unborn child. Talk to your health care professional or pharmacist for more information. Do not breast-feed an  infant while taking this medicine or for 5 months after stopping it. What side effects may I notice from receiving this medicine? Side effects that you should report to your doctor or health care professional as soon as possible:  allergic reactions like skin rash, itching or hives, swelling of the face, lips, or tongue  breathing problems  blood in the urine  bloody or watery diarrhea or black, tarry stools  changes in emotions or moods  changes in vision  chest pain  cough  dizziness  feeling faint or lightheaded, falls  fever, chills  headache with fever, neck stiffness, confusion, loss of memory, sensitivity to light, hallucination, loss of contact with reality, or seizures  joint pain  mouth sores  redness, blistering, peeling or loosening of the skin, including inside the mouth  severe muscle pain or weakness  signs and symptoms of high blood sugar such as dizziness; dry mouth; dry skin; fruity breath; nausea; stomach pain; increased hunger or thirst; increased urination  signs and symptoms of kidney injury like trouble passing urine or change in the amount of urine  signs and symptoms of liver injury like dark yellow or brown urine; general ill feeling or flu-like symptoms; light-colored stools; loss of appetite; nausea; right upper belly pain; unusually weak or tired; yellowing of the eyes or skin  swelling of the ankles, feet, hands  trouble passing urine or change in the amount of urine  unusually weak or tired  weight gain or loss Side effects that usually do not require medical attention (report to your doctor or health care professional if they continue or are bothersome):  bone pain  constipation  decreased appetite  diarrhea  muscle pain  nausea, vomiting  tiredness This list may not describe all possible side effects. Call your doctor for medical advice about side effects. You may report side effects to FDA at 1-800-FDA-1088. Where  should I keep my medicine? This drug is given in a hospital or clinic and will not be  stored at home. NOTE: This sheet is a summary. It may not cover all possible information. If you have questions about this medicine, talk to your doctor, pharmacist, or health care provider.  2021 [employer] Standard (2019-12-30 10:08:25) Trastuzumab injection for infusion What is this medicine? TRASTUZUMAB (tras TOO zoo mab) is a monoclonal antibody. It is used to treat breast cancer and stomach cancer. This medicine may be used for other purposes; ask your health care provider or pharmacist if you have questions. COMMON BRAND NAME(S): Herceptin, Galvin Proffer, Trazimera What should I tell my health care provider before I take this medicine? They need to know if you have any of these conditions:  heart disease  heart failure  lung or breathing disease, like asthma  an unusual or allergic reaction to trastuzumab, benzyl alcohol, or other medications, foods, dyes, or preservatives  pregnant or trying to get pregnant  breast-feeding How should I use this medicine? This drug is given as an infusion into a vein. It is administered in a hospital or clinic by a specially trained health care professional. Talk to your pediatrician regarding the use of this medicine in children. This medicine is not approved for use in children. Overdosage: If you think you have taken too much of this medicine contact a poison control center or emergency room at once. NOTE: This medicine is only for you. Do not share this medicine with others. What if I miss a dose? It is important not to miss a dose. Call your doctor or health care professional if you are unable to keep an appointment. What may interact with this medicine? This medicine may interact with the following medications:  certain types of chemotherapy, such as daunorubicin, doxorubicin, epirubicin, and idarubicin This list may not  describe all possible interactions. Give your health care provider a list of all the medicines, herbs, non-prescription drugs, or dietary supplements you use. Also tell them if you smoke, drink alcohol, or use illegal drugs. Some items may interact with your medicine. What should I watch for while using this medicine? Visit your doctor for checks on your progress. Report any side effects. Continue your course of treatment even though you feel ill unless your doctor tells you to stop. Call your doctor or health care professional for advice if you get a fever, chills or sore throat, or other symptoms of a cold or flu. Do not treat yourself. Try to avoid being around people who are sick. You may experience fever, chills and shaking during your first infusion. These effects are usually mild and can be treated with other medicines. Report any side effects during the infusion to your health care professional. Fever and chills usually do not happen with later infusions. Do not become pregnant while taking this medicine or for 7 months after stopping it. Women should inform their doctor if they wish to become pregnant or think they might be pregnant. Women of child-bearing potential will need to have a negative pregnancy test before starting this medicine. There is a potential for serious side effects to an unborn child. Talk to your health care professional or pharmacist for more information. Do not breast-feed an infant while taking this medicine or for 7 months after stopping it. Women must use effective birth control with this medicine. What side effects may I notice from receiving this medicine? Side effects that you should report to your doctor or health care professional as soon as possible:  allergic reactions like skin rash, itching or hives,  swelling of the face, lips, or tongue  chest pain or palpitations  cough  dizziness  feeling faint or lightheaded, falls  fever  general ill feeling or  flu-like symptoms  signs of worsening heart failure like breathing problems; swelling in your legs and feet  unusually weak or tired Side effects that usually do not require medical attention (report to your doctor or health care professional if they continue or are bothersome):  bone pain  changes in taste  diarrhea  joint pain  nausea/vomiting  weight loss This list may not describe all possible side effects. Call your doctor for medical advice about side effects. You may report side effects to FDA at 1-800-FDA-1088. Where should I keep my medicine? This drug is given in a hospital or clinic and will not be stored at home. NOTE: This sheet is a summary. It may not cover all possible information. If you have questions about this medicine, talk to your doctor, pharmacist, or health care provider.  2021 Elsevier/Gold Standard (2016-08-21 14:37:52)

## 2020-10-20 NOTE — Progress Notes (Signed)
Cardiology Office Note:    Date:  10/21/2020   ID:  Odes, Lolli 06-Feb-1951, MRN [id number]  PCP:  [name], MD   [facility]  Cardiologist:  Mertie Moores, MD  Advanced Practice Provider:  Liliane Shi, PA-C Electrophysiologist:  None       Referring MD: [name], MD   Chief Complaint:  Follow-up (CAD)    Patient Profile:    Jimmy Blankenship is a 70 y.o. male with:   Coronary artery disease  ? Hx of PCI in early 2000s (in [place]) ? [place] >> S/p CABG 03/2020  (HFrEF) heart failure with reduced ejection fraction  ? Ischemic cardiomyopathy ? Echo 7/21: EF 35 ? Intraoperative TEE 7/21: EF 40-45 ? Echo 9/21: EF 40-45, Gr 2 DD  Diabetes mellitus   Hypertension   Hyperlipidemia   Chronic kidney disease   Iron deficiency anemia   Aortic atherosclerosis  Gastric CA  Hx of UGI bleed 08/2020   Dr. Marin Olp; Rx - FOLFOX/Nivolumab/Herceptin started 09/19/20  Prior CV studies: Cardiac monitor 10/21  Predominately sinus rhythm  Rare PVCs  Rare brief episoes of nonsustained VT (4 beat run)  Rare brief episodes of nonsustained SVT  Echocardiogram 05/30/2020 Apical AK, EF 40-45, GRII DD, trace AI, mild MR, mild LAE  Intraoperative TEE 03/24/2020 EF 40-45  Carotid US 03/18/2020 Bilateral ICA 1-39  Echocardiogram 03/18/2020 EF 35, inferior, septal, lateral and apical HK/AK, normal RVSF, trivial MR    History of Present Illness:    Jimmy Blankenship was last seen by Dr. Acie Fredrickson in 12/21.  The patient was short of breath.  A Hgb was 5.4 and he was admitted to the hospital.  EGD demonstrated a lesion in his stomach concerning for malignancy. 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     He returns for follow-up.  He is here with his daughter.  She helps with interpretation.  He has a poor appetite with chemotherapy.  He has not had any chest discomfort, shortness  of breath, orthopnea, syncope, leg edema.      Past Medical History:  Diagnosis Date  . 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[place]) 08/22/2020  . CKD (chronic kidney disease)   . Coronary Artery Disease s/p CABG    hx of PCI in 2000s in [place] // s/p NSTEMI in 7/21 >> CABG  . Diabetes mellitus 2   . Goals of care, counseling/discussion 08/22/2020  . Heart failure with reduced ejection fraction    Ischemic CM // Echo 7/21 - EF 35 // Intraop TEE 7/21: EF 40-45 // Echocardiogram 9/21: apical AK, EF 40-45, Gr 2 DD, trace AI, mild MR, mild LAE, normal RVSF, no pericardial effusion  . High cholesterol   . Hypertension   . Iron deficiency anemia    Heme + stools (during admit for NSTEMI in 7/21)  . 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 ([place]) 08/22/2020  . Palpitations    Event monitor 10/21: NSR, rare PVCs, rare 4 beats NSVT, rare brief non-sustained SVT    Current Medications: Current Meds  Medication Sig  . atorvastatin (LIPITOR) 80 MG tablet Take 1 tablet (80 mg total) by mouth daily.  . clopidogrel (PLAVIX) 75 MG tablet Take 75 mg by mouth daily.  . Cyanocobalamin (VITAMIN B-12 PO) Take 1 tablet by mouth daily.  Marland Kitchen dexamethasone (DECADRON) 4 MG tablet Take 2 tablets (8 mg total) by mouth daily. Start the  day after chemotherapy for 2 days. Take with food.  . feeding supplement (ENSURE ENLIVE / ENSURE PLUS) LIQD Take 237 mLs by mouth 2 (two) times daily between meals.  . gabapentin (NEURONTIN) 300 MG capsule Take 300 mg by mouth daily.  Marland Kitchen glimepiride (AMARYL) 2 MG tablet Take 1 tablet (2 mg total) by mouth daily with breakfast.  . lidocaine-prilocaine (EMLA) cream Apply to affected area once  . lisinopril (ZESTRIL) 2.5 MG tablet Take 2.5 mg by mouth daily.  . metFORMIN (GLUCOPHAGE) 1000 MG tablet Take by mouth.  . metoprolol tartrate (LOPRESSOR) 50 MG tablet Take 1 tablet (50 mg total) by mouth 2 (two) times daily.  . ondansetron (ZOFRAN) 8 MG tablet Take 1 tablet (8 mg total) by mouth 2 (two)  times daily as needed for refractory nausea / vomiting. Start on day 3 after chemotherapy.  . pantoprazole (PROTONIX) 40 MG tablet Take 1 tablet (40 mg total) by mouth 2 (two) times daily.  . prochlorperazine (COMPAZINE) 10 MG tablet Take 1 tablet (10 mg total) by mouth every 6 (six) hours as needed (Nausea or vomiting).     Allergies:   Patient has no known allergies.   Social History   Tobacco Use  . Smoking status: Former Smoker    Packs/day: 1.00    Years: 30.00    Pack years: 30.00    Quit date: 2006    Years since quitting: 16.1  . Smokeless tobacco: Never Used  . Tobacco comment: quit 2008  Vaping Use  . Vaping Use: Never used  Substance Use Topics  . Alcohol use: Never  . Drug use: Never     Family Hx: The patient's family history includes Atrial fibrillation in his mother; Congestive Heart Failure in his mother. There is no history of Cancer.  ROS   EKGs/Labs/Other Test Reviewed:    EKG:  EKG is not ordered today.  The ekg ordered today demonstrates n/a  Recent Labs: 03/25/2020: Magnesium 2.1 10/17/2020: ALT 16; BUN 14; Creatinine 1.15; Hemoglobin 9.5; Platelet Count 197; Potassium 3.3; Sodium 136; TSH 1.109   Recent Lipid Panel Lab Results  Component Value Date/Time   CHOL 118 06/10/2020 09:35 AM   TRIG 105 06/10/2020 09:35 AM   HDL 38 (L) 06/10/2020 09:35 AM   CHOLHDL 3.1 06/10/2020 09:35 AM   LDLCALC 60 06/10/2020 09:35 AM      Risk Assessment/Calculations:      Physical Exam:    VS:  BP (!) 100/58   Pulse 90   Ht 5\' 9"  (1.753 m)   Wt 140 lb 6.4 oz (63.7 kg)   SpO2 98%   BMI 20.73 kg/m     Wt Readings from Last 3 Encounters:  10/21/20 140 lb 6.4 oz (63.7 kg)  10/17/20 146 lb (66.2 kg)  10/03/20 147 lb (66.7 kg)     Constitutional:      Appearance: Healthy appearance. Not in distress.  Neck:     Vascular: No JVR.  Pulmonary:     Effort: Pulmonary effort is normal.     Breath sounds: No wheezing. No rales.  Cardiovascular:     Normal  rate. Regular rhythm. Normal S1. Normal S2.     Murmurs: There is no murmur.  Edema:    Peripheral edema absent.  Abdominal:     Palpations: Abdomen is soft.  Skin:    General: Skin is warm and dry.  Neurological:     General: No focal deficit present.     Mental Status:  Alert and oriented to person, place and time.     Cranial Nerves: Cranial nerves are intact.       ASSESSMENT & PLAN:    1. Coronary artery disease involving native coronary artery of native heart without angina pectoris Status post non-STEMI followed by CABG in July 2021.  He is doing well without angina.  Continue clopidogrel, atorvastatin, metoprolol, lisinopril.  F/u in 6 mos.   2. HFrEF (heart failure with reduced ejection fraction) (HCC) EF 40-45.  Ischemic CM.  NYHA II.  Volume status stable.  His BP will not be able to tolerate any further increase in GDMT.  Continue current dose of beta-blocker, ACE inhibitor.    3. 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 [facility]) He has had 2 cycles of chemotherapy.  He is followed by Dr Marin Olp.   4. Essential hypertension The patient's blood pressure is controlled on his current regimen.  Continue current therapy.    5. Mixed hyperlipidemia LDL optimal on most recent lab work.  Continue current Rx.      Dispo:  Return in about 6 months (around 04/20/2021) for Routine Follow Up, w/ Dr. Acie Fredrickson, or Richardson Dopp, PA-C, in person.   Medication Adjustments/Labs and Tests Ordered: Current medicines are reviewed at length with the patient today.  Concerns regarding medicines are outlined above.  Tests Ordered: No orders of the defined types were placed in this encounter.  Medication Changes: No orders of the defined types were placed in this encounter.   Signed, Richardson Dopp, PA-C  10/21/2020 11:25 AM    [facility] [place], [place], [place]  [address] Phone: [phone]; Fax: [phone]

## 2020-10-21 ENCOUNTER — Encounter: Payer: Self-pay | Admitting: Physician Assistant

## 2020-10-21 ENCOUNTER — Ambulatory Visit: Payer: Medicare Other | Admitting: Physician Assistant

## 2020-10-21 ENCOUNTER — Other Ambulatory Visit: Payer: Self-pay

## 2020-10-21 VITALS — BP 100/58 | HR 90 | Ht 69.0 in | Wt 140.4 lb

## 2020-10-21 DIAGNOSIS — I2510 Atherosclerotic heart disease of native coronary artery without angina pectoris: Secondary | ICD-10-CM | POA: Diagnosis not present

## 2020-10-21 DIAGNOSIS — E782 Mixed hyperlipidemia: Secondary | ICD-10-CM

## 2020-10-21 DIAGNOSIS — C169 Malignant neoplasm of stomach, unspecified: Secondary | ICD-10-CM

## 2020-10-21 DIAGNOSIS — I5020 Unspecified systolic (congestive) heart failure: Secondary | ICD-10-CM

## 2020-10-21 DIAGNOSIS — C799 Secondary malignant neoplasm of unspecified site: Secondary | ICD-10-CM

## 2020-10-21 DIAGNOSIS — I10 Essential (primary) hypertension: Secondary | ICD-10-CM | POA: Diagnosis not present

## 2020-10-21 NOTE — Patient Instructions (Signed)
Medication Instructions:  Your physician recommends that you continue on your current medications as directed. Please refer to the Current Medication list given to you today.  *If you need a refill on your cardiac medications before your next appointment, please call your pharmacy*   Lab Work: None If you have labs (blood work) drawn today and your tests are completely normal, you will receive your results only by: Marland Kitchen MyChart Message (if you have MyChart) OR . A paper copy in the mail If you have any lab test that is abnormal or we need to change your treatment, we will call you to review the results.   Follow-Up: At [facility], you and your health needs are our priority.  As part of our continuing mission to provide you with exceptional heart care, we have created designated Provider Care Teams.  These Care Teams include your primary Cardiologist (physician) and Advanced Practice Providers (APPs -  Physician Assistants and Nurse Practitioners) who all work together to provide you with the care you need, when you need it.  Your next appointment:   6 month(s)  The format for your next appointment:   In Person  Provider:   You may see Mertie Moores, MD or one of the following Advanced Practice Providers on your designated Care Team:    Richardson Dopp, [place]

## 2020-10-31 ENCOUNTER — Inpatient Hospital Stay (HOSPITAL_BASED_OUTPATIENT_CLINIC_OR_DEPARTMENT_OTHER): Payer: Medicare Other | Admitting: Hematology & Oncology

## 2020-10-31 ENCOUNTER — Inpatient Hospital Stay: Payer: Medicare Other

## 2020-10-31 ENCOUNTER — Other Ambulatory Visit: Payer: Self-pay

## 2020-10-31 ENCOUNTER — Telehealth: Payer: Self-pay

## 2020-10-31 ENCOUNTER — Encounter: Payer: Self-pay | Admitting: Hematology & Oncology

## 2020-10-31 VITALS — BP 124/80 | HR 89 | Temp 98.2°F | Resp 18 | Wt 144.0 lb

## 2020-10-31 DIAGNOSIS — C799 Secondary malignant neoplasm of unspecified site: Secondary | ICD-10-CM

## 2020-10-31 DIAGNOSIS — C165 Malignant neoplasm of lesser curvature of stomach, unspecified: Secondary | ICD-10-CM

## 2020-10-31 DIAGNOSIS — D500 Iron deficiency anemia secondary to blood loss (chronic): Secondary | ICD-10-CM

## 2020-10-31 DIAGNOSIS — Z5112 Encounter for antineoplastic immunotherapy: Secondary | ICD-10-CM | POA: Diagnosis not present

## 2020-10-31 DIAGNOSIS — C169 Malignant neoplasm of stomach, unspecified: Secondary | ICD-10-CM

## 2020-10-31 LAB — FERRITIN: Ferritin: 552 ng/mL — ABNORMAL HIGH (ref 24–336)

## 2020-10-31 LAB — CBC WITH DIFFERENTIAL (CANCER CENTER ONLY)
Abs Immature Granulocytes: 0.08 10*3/uL — ABNORMAL HIGH (ref 0.00–0.07)
Basophils Absolute: 0 10*3/uL (ref 0.0–0.1)
Basophils Relative: 1 %
Eosinophils Absolute: 0.1 10*3/uL (ref 0.0–0.5)
Eosinophils Relative: 2 %
HCT: 29 % — ABNORMAL LOW (ref 39.0–52.0)
Hemoglobin: 9.3 g/dL — ABNORMAL LOW (ref 13.0–17.0)
Immature Granulocytes: 2 %
Lymphocytes Relative: 28 %
Lymphs Abs: 0.9 10*3/uL (ref 0.7–4.0)
MCH: 26.1 pg (ref 26.0–34.0)
MCHC: 32.1 g/dL (ref 30.0–36.0)
MCV: 81.5 fL (ref 80.0–100.0)
Monocytes Absolute: 0.8 10*3/uL (ref 0.1–1.0)
Monocytes Relative: 24 %
Neutro Abs: 1.4 10*3/uL — ABNORMAL LOW (ref 1.7–7.7)
Neutrophils Relative %: 43 %
Platelet Count: 199 10*3/uL (ref 150–400)
RBC: 3.56 MIL/uL — ABNORMAL LOW (ref 4.22–5.81)
RDW: 19.7 % — ABNORMAL HIGH (ref 11.5–15.5)
WBC Count: 3.3 10*3/uL — ABNORMAL LOW (ref 4.0–10.5)
nRBC: 0 % (ref 0.0–0.2)

## 2020-10-31 LAB — CMP (CANCER CENTER ONLY)
ALT: 20 U/L (ref 0–44)
AST: 17 U/L (ref 15–41)
Albumin: 3.5 g/dL (ref 3.5–5.0)
Alkaline Phosphatase: 61 U/L (ref 38–126)
Anion gap: 9 (ref 5–15)
BUN: 21 mg/dL (ref 8–23)
CO2: 27 mmol/L (ref 22–32)
Calcium: 8.9 mg/dL (ref 8.9–10.3)
Chloride: 100 mmol/L (ref 98–111)
Creatinine: 1.03 mg/dL (ref 0.61–1.24)
GFR, Estimated: >60 mL/min (ref >60)
Glucose, Bld: 231 mg/dL — ABNORMAL HIGH (ref 70–99)
Potassium: 3.9 mmol/L (ref 3.5–5.1)
Sodium: 136 mmol/L (ref 135–145)
Total Bilirubin: 0.4 mg/dL (ref 0.3–1.2)
Total Protein: 5.9 g/dL — ABNORMAL LOW (ref 6.5–8.1)

## 2020-10-31 LAB — LACTATE DEHYDROGENASE: LDH: 182 U/L (ref 98–192)

## 2020-10-31 LAB — IRON AND TIBC
Iron: 41 ug/dL — ABNORMAL LOW (ref 42–163)
Saturation Ratios: 17 % — ABNORMAL LOW (ref 20–55)
TIBC: 239 ug/dL (ref 202–409)
UIBC: 197 ug/dL (ref 117–376)

## 2020-10-31 LAB — PREALBUMIN: Prealbumin: 16.4 mg/dL — ABNORMAL LOW (ref 18–38)

## 2020-10-31 LAB — TSH: TSH: 0.751 u[IU]/mL (ref 0.320–4.118)

## 2020-10-31 MED ORDER — PALONOSETRON HCL INJECTION 0.25 MG/5ML
INTRAVENOUS | Status: AC
  Filled 2020-10-31: qty 5

## 2020-10-31 MED ORDER — SODIUM CHLORIDE 0.9 % IV SOLN
Freq: Once | INTRAVENOUS | Status: AC
  Filled 2020-10-31: qty 250

## 2020-10-31 MED ORDER — DIPHENHYDRAMINE HCL 25 MG PO CAPS
ORAL_CAPSULE | ORAL | Status: AC
  Filled 2020-10-31: qty 2

## 2020-10-31 MED ORDER — LEUCOVORIN CALCIUM INJECTION 350 MG
400.0000 mg/m2 | Freq: Once | INTRAMUSCULAR | Status: AC
  Administered 2020-10-31: 700 mg via INTRAVENOUS
  Filled 2020-10-31: qty 35

## 2020-10-31 MED ORDER — DEXTROSE 5 % IV SOLN
Freq: Once | INTRAVENOUS | Status: AC
  Filled 2020-10-31: qty 250

## 2020-10-31 MED ORDER — DIPHENHYDRAMINE HCL 25 MG PO CAPS
50.0000 mg | ORAL_CAPSULE | Freq: Once | ORAL | Status: AC
  Administered 2020-10-31: 50 mg via ORAL

## 2020-10-31 MED ORDER — SODIUM CHLORIDE 0.9% FLUSH
10.0000 mL | INTRAVENOUS | Status: DC | PRN
  Filled 2020-10-31: qty 10

## 2020-10-31 MED ORDER — HEPARIN SOD (PORK) LOCK FLUSH 100 UNIT/ML IV SOLN
500.0000 [IU] | Freq: Once | INTRAVENOUS | Status: DC | PRN
  Filled 2020-10-31: qty 5

## 2020-10-31 MED ORDER — TRASTUZUMAB-DKST CHEMO 150 MG IV SOLR
4.0000 mg/kg | Freq: Once | INTRAVENOUS | Status: AC
  Administered 2020-10-31: 252 mg via INTRAVENOUS
  Filled 2020-10-31: qty 12

## 2020-10-31 MED ORDER — FLUOROURACIL CHEMO INJECTION 2.5 GM/50ML
400.0000 mg/m2 | Freq: Once | INTRAVENOUS | Status: AC
  Administered 2020-10-31: 700 mg via INTRAVENOUS
  Filled 2020-10-31: qty 14

## 2020-10-31 MED ORDER — OXALIPLATIN CHEMO INJECTION 100 MG/20ML
85.0000 mg/m2 | Freq: Once | INTRAVENOUS | Status: AC
  Administered 2020-10-31: 150 mg via INTRAVENOUS
  Filled 2020-10-31: qty 20

## 2020-10-31 MED ORDER — PALONOSETRON HCL INJECTION 0.25 MG/5ML
0.2500 mg | Freq: Once | INTRAVENOUS | Status: AC
  Administered 2020-10-31: 0.25 mg via INTRAVENOUS

## 2020-10-31 MED ORDER — SODIUM CHLORIDE 0.9 % IV SOLN
10.0000 mg | Freq: Once | INTRAVENOUS | Status: AC
  Administered 2020-10-31: 10 mg via INTRAVENOUS
  Filled 2020-10-31: qty 10

## 2020-10-31 MED ORDER — ACETAMINOPHEN 325 MG PO TABS
ORAL_TABLET | ORAL | Status: AC
  Filled 2020-10-31: qty 2

## 2020-10-31 MED ORDER — ACETAMINOPHEN 325 MG PO TABS
650.0000 mg | ORAL_TABLET | Freq: Once | ORAL | Status: AC
  Administered 2020-10-31: 650 mg via ORAL

## 2020-10-31 MED ORDER — SODIUM CHLORIDE 0.9 % IV SOLN
2160.0000 mg/m2 | INTRAVENOUS | Status: DC
  Administered 2020-10-31: 3800 mg via INTRAVENOUS
  Filled 2020-10-31: qty 76

## 2020-10-31 NOTE — Progress Notes (Signed)
Pt discharged in no apparent distress. Pt left ambulatory without assistance. Pt aware of discharge instructions.

## 2020-10-31 NOTE — Patient Instructions (Signed)
[place] Discharge Instructions for Patients Receiving Chemotherapy  Today you received the following chemotherapy agents Oxaliplatin, 5FU, Leucovorin, Trastuzamab  To help prevent nausea and vomiting after your treatment, we encourage you to take your nausea medication    If you develop nausea and vomiting that is not controlled by your nausea medication, call the clinic.   BELOW ARE SYMPTOMS THAT SHOULD BE REPORTED IMMEDIATELY:  *FEVER GREATER THAN 100.5 F  *CHILLS WITH OR WITHOUT FEVER  NAUSEA AND VOMITING THAT IS NOT CONTROLLED WITH YOUR NAUSEA MEDICATION  *UNUSUAL SHORTNESS OF BREATH  *UNUSUAL BRUISING OR BLEEDING  TENDERNESS IN MOUTH AND THROAT WITH OR WITHOUT PRESENCE OF ULCERS  *URINARY PROBLEMS  *BOWEL PROBLEMS  UNUSUAL RASH Items with * indicate a potential emergency and should be followed up as soon as possible.  Feel free to call the clinic should you have any questions or concerns. The clinic phone number is [phone].  Please show the [place] at check-in to the Emergency Department and triage nurse.

## 2020-10-31 NOTE — Patient Instructions (Signed)
Implanted Port Insertion, Care After This sheet gives you information about how to care for yourself after your procedure. Your health care provider may also give you more specific instructions. If you have problems or questions, contact your health care provider. What can I expect after the procedure? After the procedure, it is common to have:  Discomfort at the port insertion site.  Bruising on the skin over the port. This should improve over 3-4 days. Follow these instructions at home: Port care  After your port is placed, you will get a manufacturer's information card. The card has information about your port. Keep this card with you at all times.  Take care of the port as told by your health care provider. Ask your health care provider if you or a family member can get training for taking care of the port at home. A home health care nurse may also take care of the port.  Make sure to remember what type of port you have. Incision care  Follow instructions from your health care provider about how to take care of your port insertion site. Make sure you: ? Wash your hands with soap and water before and after you change your bandage (dressing). If soap and water are not available, use hand sanitizer. ? Change your dressing as told by your health care provider. ? Leave stitches (sutures), skin glue, or adhesive strips in place. These skin closures may need to stay in place for 2 weeks or longer. If adhesive strip edges start to loosen and curl up, you may trim the loose edges. Do not remove adhesive strips completely unless your health care provider tells you to do that.  Check your port insertion site every day for signs of infection. Check for: ? Redness, swelling, or pain. ? Fluid or blood. ? Warmth. ? Pus or a bad smell.      Activity  Return to your normal activities as told by your health care provider. Ask your health care provider what activities are safe for you.  Do not  lift anything that is heavier than 10 lb (4.5 kg), or the limit that you are told, until your health care provider says that it is safe. General instructions  Take over-the-counter and prescription medicines only as told by your health care provider.  Do not take baths, swim, or use a hot tub until your health care provider approves. Ask your health care provider if you may take showers. You may only be allowed to take sponge baths.  Do not drive for 24 hours if you were given a sedative during your procedure.  Wear a medical alert bracelet in case of an emergency. This will tell any health care providers that you have a port.  Keep all follow-up visits as told by your health care provider. This is important. Contact a health care provider if:  You cannot flush your port with saline as directed, or you cannot draw blood from the port.  You have a fever or chills.  You have redness, swelling, or pain around your port insertion site.  You have fluid or blood coming from your port insertion site.  Your port insertion site feels warm to the touch.  You have pus or a bad smell coming from the port insertion site. Get help right away if:  You have chest pain or shortness of breath.  You have bleeding from your port that you cannot control. Summary  Take care of the port as told by your   health care provider. Keep the manufacturer's information card with you at all times.  Change your dressing as told by your health care provider.  Contact a health care provider if you have a fever or chills or if you have redness, swelling, or pain around your port insertion site.  Keep all follow-up visits as told by your health care provider. This information is not intended to replace advice given to you by your health care provider. Make sure you discuss any questions you have with your health care provider. Document Revised: 03/25/2018 Document Reviewed: 03/25/2018 Elsevier Patient Education   2021 Elsevier Inc.  

## 2020-10-31 NOTE — Progress Notes (Signed)
Ok to treat with ANC 1.4 per Dr Ennever 

## 2020-10-31 NOTE — Telephone Encounter (Signed)
S/w pts daughter and she is aware of added iron tx for 2/23 and that I had to move pump appt to accommodate      [name]

## 2020-10-31 NOTE — Telephone Encounter (Signed)
appts made per 10/31/20 los, pts daughter states will view on my chart, aware that ct scan will be approved and then will be will be scheduled    Jimmy Blankenship

## 2020-10-31 NOTE — Progress Notes (Signed)
Hematology and Oncology Follow Up Visit  Jimmy Blankenship [id number] [birth year] 70 y.o. 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 is back for his follow-up.  So far, he is done incredibly well.  Outside of his high blood sugars, he really has not had any other problem with treatment.  He has had a couple mouth sores but uses a mouth rinse.  He has had no problems with bleeding.  There has been no change in bowel or bladder habits.  He has had no issues with fever.  There has been no cough or shortness of breath.  He is eating better.  His family is doing a great job with him.  His albumin is starting to come up slowly.  He has had no problems with leg swelling.  He has had no rashes.  We do give him iron on occasion.  Blood 70s here, his ferritin was 910 with an iron saturation of 18%.  As such, we will give him a dose of iron.  Currently, his performance status is ECOG 1.    Medications:  Current Outpatient Medications:  .  atorvastatin (LIPITOR) 80 MG tablet, Take 1 tablet (80 mg total) by mouth daily., Disp: 90 tablet, Rfl: 3 .  clopidogrel (PLAVIX) 75 MG tablet, Take 75 mg by mouth daily., Disp: , Rfl:  .  Cyanocobalamin (VITAMIN B-12 PO), Take 1 tablet by mouth daily., Disp: , Rfl:  .  dexamethasone (DECADRON) 4 MG tablet, Take 2 tablets (8 mg total) by mouth daily. Start the day after chemotherapy for 2 days. Take with food., Disp: 30 tablet, Rfl: 1 .  feeding supplement (ENSURE ENLIVE / ENSURE PLUS) LIQD, Take 237 mLs by mouth 2 (two) times daily between meals., Disp: 237 mL, Rfl: 12 .  gabapentin (NEURONTIN) 300 MG capsule, Take 300 mg by mouth daily., Disp: , Rfl:  .  glimepiride  (AMARYL) 2 MG tablet, Take 1 tablet (2 mg total) by mouth daily with breakfast., Disp: 30 tablet, Rfl: 0 .  lidocaine-prilocaine (EMLA) cream, Apply to affected area once, Disp: 30 g, Rfl: 3 .  lisinopril (ZESTRIL) 2.5 MG tablet, Take 2.5 mg by mouth daily., Disp: , Rfl:  .  metFORMIN (GLUCOPHAGE) 1000 MG tablet, Take by mouth., Disp: , Rfl:  .  metoprolol tartrate (LOPRESSOR) 50 MG tablet, Take 1 tablet (50 mg total) by mouth 2 (two) times daily., Disp: 180 tablet, Rfl: 1 .  ondansetron (ZOFRAN) 8 MG tablet, Take 1 tablet (8 mg total) by mouth 2 (two) times daily as needed for refractory nausea / vomiting. Start on day 3 after chemotherapy., Disp: 30 tablet, Rfl: 1 .  pantoprazole (PROTONIX) 40 MG tablet, Take 1 tablet (40 mg total) by mouth 2 (two) times daily., Disp: 60 tablet, Rfl: 1 .  prochlorperazine (COMPAZINE) 10 MG tablet, Take 1 tablet (10 mg total) by mouth every 6 (six) hours as needed (Nausea or vomiting)., Disp: 30 tablet, Rfl: 1  Allergies: No Known Allergies  Past Medical History, Surgical history, Social history, and Family History were reviewed and updated.  Review of Systems: Review of Systems  Constitutional: Negative.   HENT:  Negative.   Eyes: Negative.   Respiratory: Negative.  Cardiovascular: Negative.   Endocrine: Negative.   Genitourinary: Negative.    Musculoskeletal: Negative.   Skin: Negative.   Neurological: Negative.   Hematological: Negative.   Psychiatric/Behavioral: Negative.     Physical Exam:  weight is 144 lb (65.3 kg). His oral temperature is 98.2 F (36.8 C). His blood pressure is 124/80 and his pulse is 89. His respiration is 18 and oxygen saturation is 100%.   Wt Readings from Last 3 Encounters:  10/31/20 144 lb (65.3 kg)  10/21/20 140 lb 6.4 oz (63.7 kg)  10/17/20 146 lb (66.2 kg)    Physical Exam Vitals reviewed.  HENT:     Head: Normocephalic and atraumatic.  Eyes:     Pupils: Pupils are equal, round, and reactive to light.   Cardiovascular:     Rate and Rhythm: Normal rate and regular rhythm.     Heart sounds: Normal heart sounds.  Pulmonary:     Effort: Pulmonary effort is normal.     Breath sounds: Normal breath sounds.  Abdominal:     General: Bowel sounds are normal.     Palpations: Abdomen is soft.  Musculoskeletal:        General: No tenderness or deformity. Normal range of motion.     Cervical back: Normal range of motion.  Lymphadenopathy:     Cervical: No cervical adenopathy.  Skin:    General: Skin is warm and dry.     Findings: No erythema or rash.  Neurological:     Mental Status: He is alert and oriented to person, place, and time.  Psychiatric:        Behavior: Behavior normal.        Thought Content: Thought content normal.        Judgment: Judgment normal.    Lab Results  Component Value Date   WBC 3.3 (L) 10/31/2020   HGB 9.3 (L) 10/31/2020   HCT 29.0 (L) 10/31/2020   MCV 81.5 10/31/2020   PLT 199 10/31/2020     Chemistry      Component Value Date/Time   NA 136 10/31/2020 0930   NA 140 06/10/2020 0935   K 3.9 10/31/2020 0930   CL 100 10/31/2020 0930   CO2 27 10/31/2020 0930   BUN 21 10/31/2020 0930   BUN 17 06/10/2020 0935   CREATININE 1.03 10/31/2020 0930      Component Value Date/Time   CALCIUM 8.9 10/31/2020 0930   ALKPHOS 61 10/31/2020 0930   AST 17 10/31/2020 0930   ALT 20 10/31/2020 0930   BILITOT 0.4 10/31/2020 0930      Impression and Plan: [name] is a very nice 70 year old [name] male.  He has metastatic adenocarcinoma of the stomach.  He has had 3 cycles of chemotherapy along with immunotherapy and targeted therapy.    I we will go ahead and do his fourth cycle of treatment.  After this cycle, we will then plan for a CT scan for follow-up to assess her for response.  I will give him an extra week off just to make sure that his blood counts are able to recover.  I am just happy that his quality of life seems to be doing well.  Again his family is  doing a wonderful job with him.    Volanda Napoleon, MD 2/21/202210:14 AM

## 2020-11-01 ENCOUNTER — Encounter: Payer: Self-pay | Admitting: *Deleted

## 2020-11-01 ENCOUNTER — Telehealth: Payer: Self-pay

## 2020-11-01 LAB — T4: T4, Total: 5.6 ug/dL (ref 4.5–12.0)

## 2020-11-01 NOTE — Telephone Encounter (Signed)
S/w pts daughter and she is aware of the ct scan appt and aware to p.u contrast      Jimmy Blankenship

## 2020-11-01 NOTE — Progress Notes (Signed)
Oncology Nurse Navigator Documentation  Oncology Nurse Navigator Flowsheets 11/01/2020  Abnormal Finding Date -  Diagnosis Status -  Planned Course of Treatment -  Phase of Treatment -  Chemotherapy Pending- Reason: -  Chemotherapy Actual Start Date: -  Navigator Follow Up Date: 11/17/2020  Navigator Follow Up Reason: Scan Review  Navigator Location [facility]  Referral Date to RadOnc/MedOnc -  Navigator Encounter Type Appt/Treatment Plan Review  Telephone -  Patient Visit Type MedOnc  Treatment Phase Active Tx  Barriers/Navigation Needs Coordination of Care;Education;Language/Communication  Education -  Interventions None Required  Acuity Level 2-Minimal Needs (1-2 Barriers Identified)  Coordination of Care -  Education Method -  Support Groups/Services Friends and Family  Time Spent with Patient 15

## 2020-11-02 ENCOUNTER — Other Ambulatory Visit: Payer: Self-pay

## 2020-11-02 ENCOUNTER — Inpatient Hospital Stay: Payer: Medicare Other

## 2020-11-02 VITALS — BP 108/67 | HR 68 | Resp 18

## 2020-11-02 VITALS — BP 120/78 | HR 74 | Temp 98.0°F | Resp 18

## 2020-11-02 DIAGNOSIS — Z5112 Encounter for antineoplastic immunotherapy: Secondary | ICD-10-CM | POA: Diagnosis not present

## 2020-11-02 DIAGNOSIS — C799 Secondary malignant neoplasm of unspecified site: Secondary | ICD-10-CM

## 2020-11-02 DIAGNOSIS — D500 Iron deficiency anemia secondary to blood loss (chronic): Secondary | ICD-10-CM

## 2020-11-02 DIAGNOSIS — C169 Malignant neoplasm of stomach, unspecified: Secondary | ICD-10-CM

## 2020-11-02 DIAGNOSIS — K922 Gastrointestinal hemorrhage, unspecified: Secondary | ICD-10-CM

## 2020-11-02 DIAGNOSIS — C165 Malignant neoplasm of lesser curvature of stomach, unspecified: Secondary | ICD-10-CM

## 2020-11-02 MED ORDER — HEPARIN SOD (PORK) LOCK FLUSH 100 UNIT/ML IV SOLN
500.0000 [IU] | Freq: Once | INTRAVENOUS | Status: AC | PRN
  Administered 2020-11-02: 500 [IU]
  Filled 2020-11-02: qty 5

## 2020-11-02 MED ORDER — SODIUM CHLORIDE 0.9 % IV SOLN
200.0000 mg | Freq: Once | INTRAVENOUS | Status: AC
  Administered 2020-11-02: 200 mg via INTRAVENOUS
  Filled 2020-11-02: qty 200

## 2020-11-02 MED ORDER — SODIUM CHLORIDE 0.9 % IV SOLN
Freq: Once | INTRAVENOUS | Status: AC
  Filled 2020-11-02: qty 250

## 2020-11-02 MED ORDER — SODIUM CHLORIDE 0.9% FLUSH
10.0000 mL | INTRAVENOUS | Status: DC | PRN
  Administered 2020-11-02: 10 mL
  Filled 2020-11-02: qty 10

## 2020-11-02 NOTE — Patient Instructions (Signed)

## 2020-11-02 NOTE — Patient Instructions (Signed)

## 2020-11-13 ENCOUNTER — Other Ambulatory Visit: Payer: Self-pay | Admitting: Hematology & Oncology

## 2020-11-14 MED ORDER — GLIMEPIRIDE 2 MG PO TABS: 2.0000 mg | ORAL_TABLET | Freq: Every day | ORAL | 0 refills | Status: DC

## 2020-11-15 ENCOUNTER — Other Ambulatory Visit: Payer: Self-pay | Admitting: *Deleted

## 2020-11-15 MED ORDER — PANTOPRAZOLE SODIUM 40 MG PO TBEC: 40.0000 mg | DELAYED_RELEASE_TABLET | Freq: Two times a day (BID) | ORAL | 1 refills | Status: DC

## 2020-11-16 ENCOUNTER — Other Ambulatory Visit: Payer: Self-pay | Admitting: Physician Assistant

## 2020-11-17 ENCOUNTER — Ambulatory Visit (HOSPITAL_BASED_OUTPATIENT_CLINIC_OR_DEPARTMENT_OTHER)
Admission: RE | Admit: 2020-11-17 | Discharge: 2020-11-17 | Disposition: A | Discharge status: Home / Self Care | Payer: Medicare Other | Source: Ambulatory Visit | Attending: Hematology & Oncology | Admitting: Hematology & Oncology

## 2020-11-17 ENCOUNTER — Other Ambulatory Visit: Payer: Self-pay

## 2020-11-17 DIAGNOSIS — C165 Malignant neoplasm of lesser curvature of stomach, unspecified: Secondary | ICD-10-CM | POA: Insufficient documentation

## 2020-11-17 IMAGING — CT CT CHEST-ABD-PELV W/ CM
2 of 5 series · 13 of 36 positions shown, 15 images · IV contrast (Omnipaque)
Comparison: MRI [DATE]. Abdomen/pelvis CT [DATE]. Chest CT
[DATE].

CLINICAL DATA: Metastatic gastric cancer.  Restaging.

EXAM:
CT CHEST, ABDOMEN, AND PELVIS WITH CONTRAST
TECHNIQUE: Multidetector CT imaging of the chest, abdomen and pelvis was
performed following the standard protocol during bolus
administration of intravenous contrast.
CONTRAST:  100mL OMNIPAQUE IOHEXOL 300 MG/ML  SOLN

[Series 2: cap with 2 · axial · 0.81mm/px · z∈[-742,-127]mm · 10 of 151 slices shown, 12 images]
[im 14/151  mediastinal]
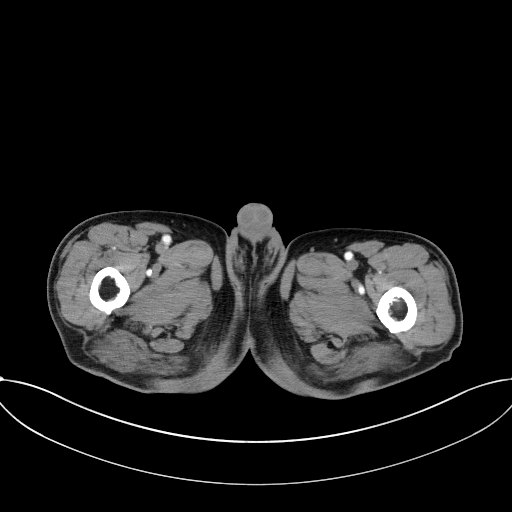
[im 14/151  bone]
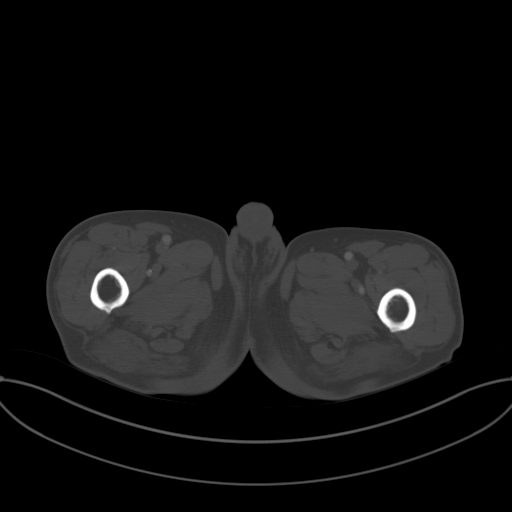
[im 28/151  mediastinal]
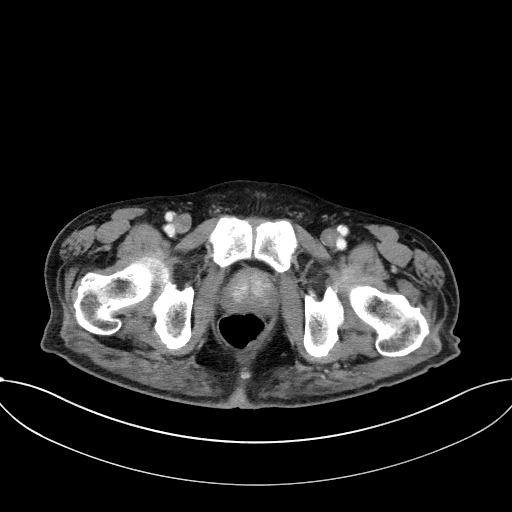
[im 41/151  mediastinal]
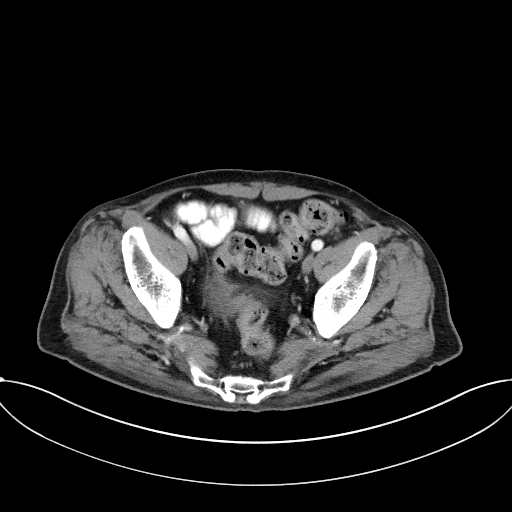
[im 55/151  mediastinal]
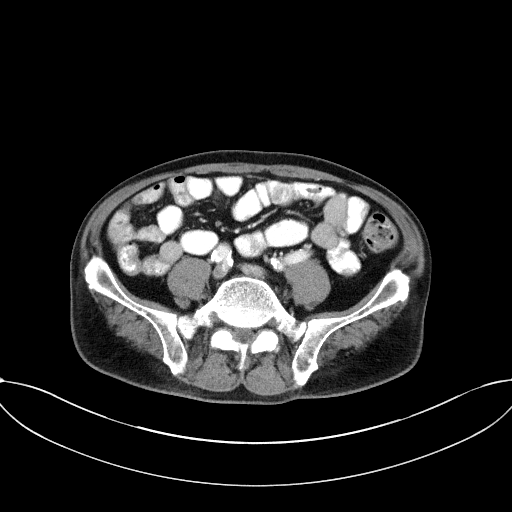
[im 69/151  mediastinal]
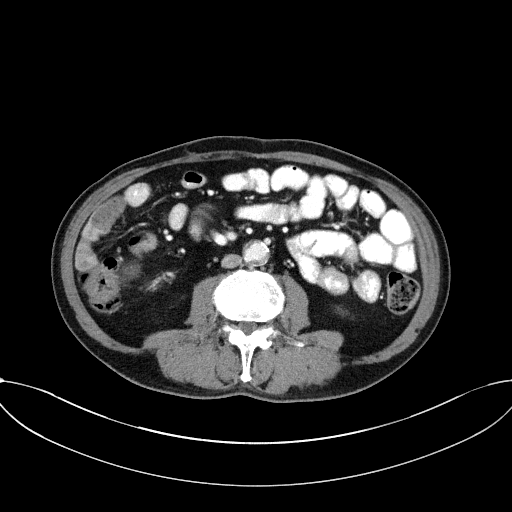
[im 82/151  mediastinal]
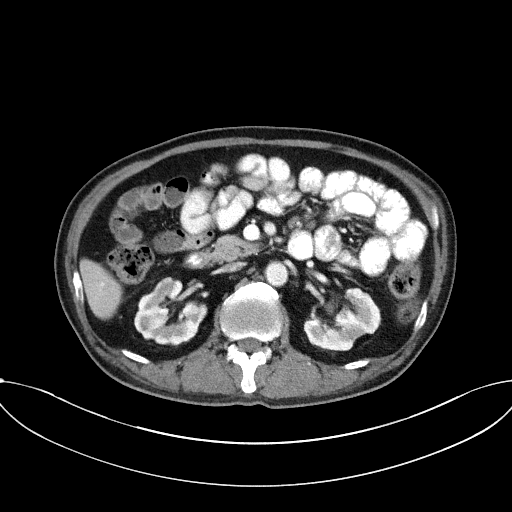
[im 96/151  mediastinal]
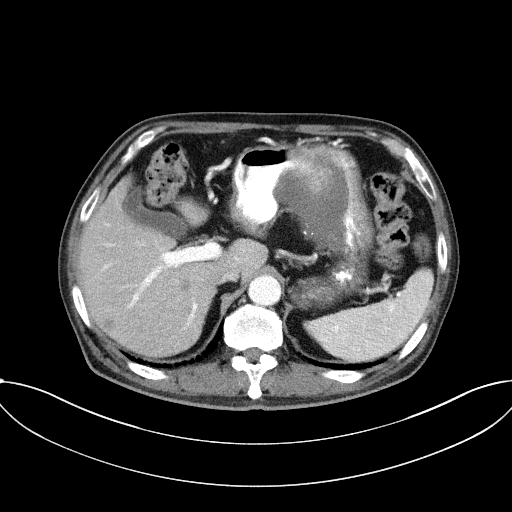
[im 110/151  mediastinal]
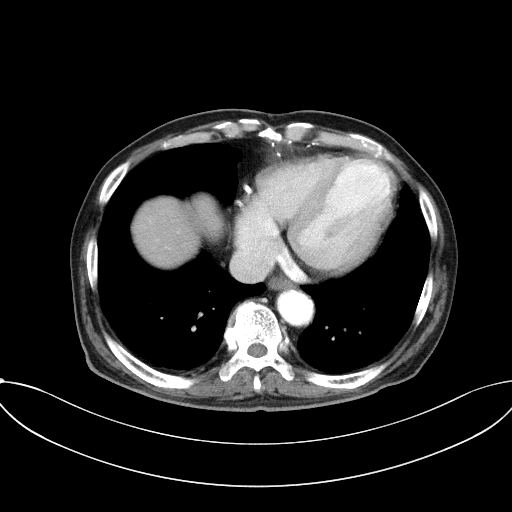
[im 123/151  mediastinal]
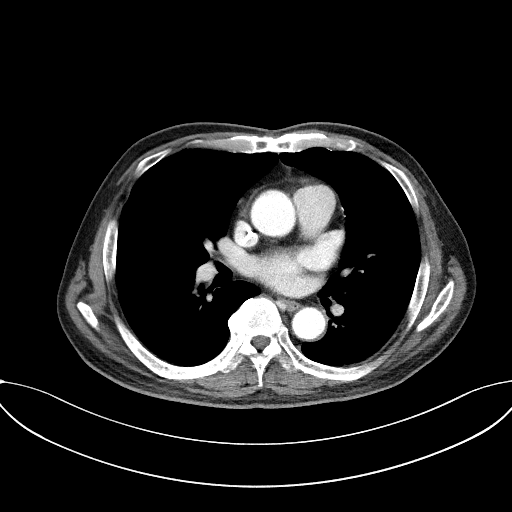
[im 123/151  bone]
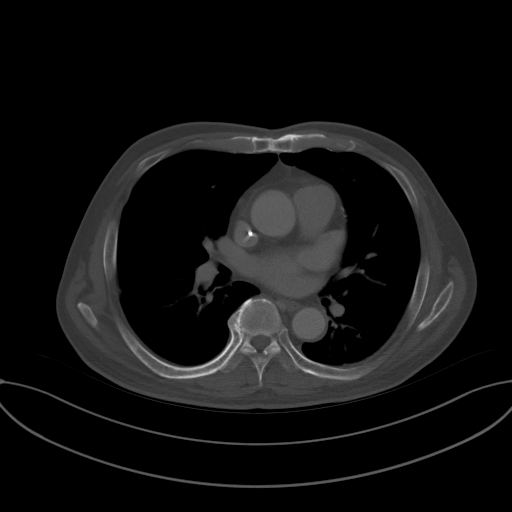
[im 137/151  mediastinal]
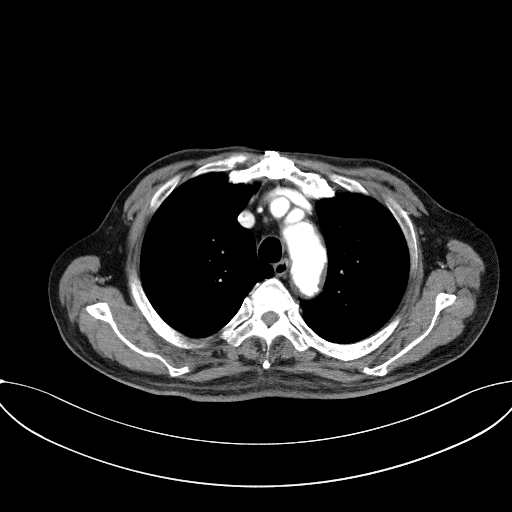

[Series 5: coronals · coronal · 0.81mm/px · 3 of 132 slices shown]
[im 27/132  mediastinal]
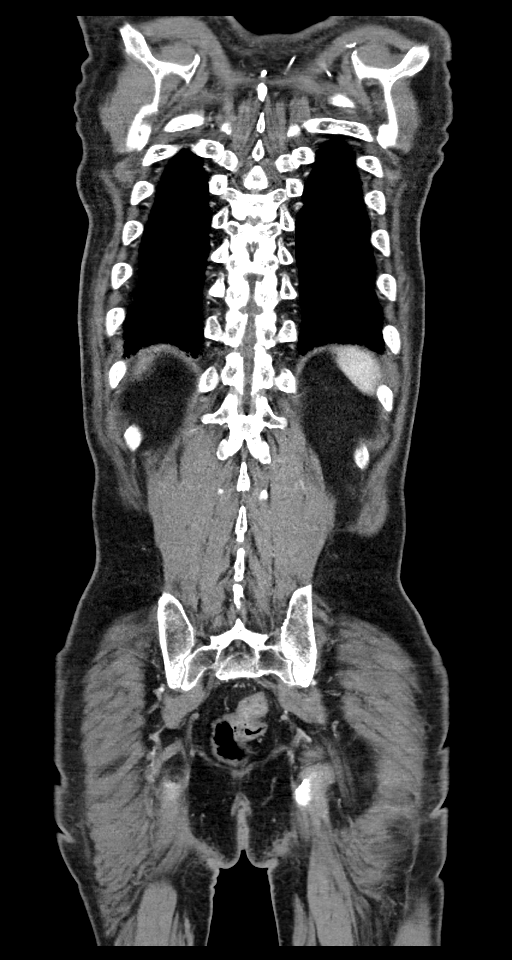
[im 53/132  mediastinal]
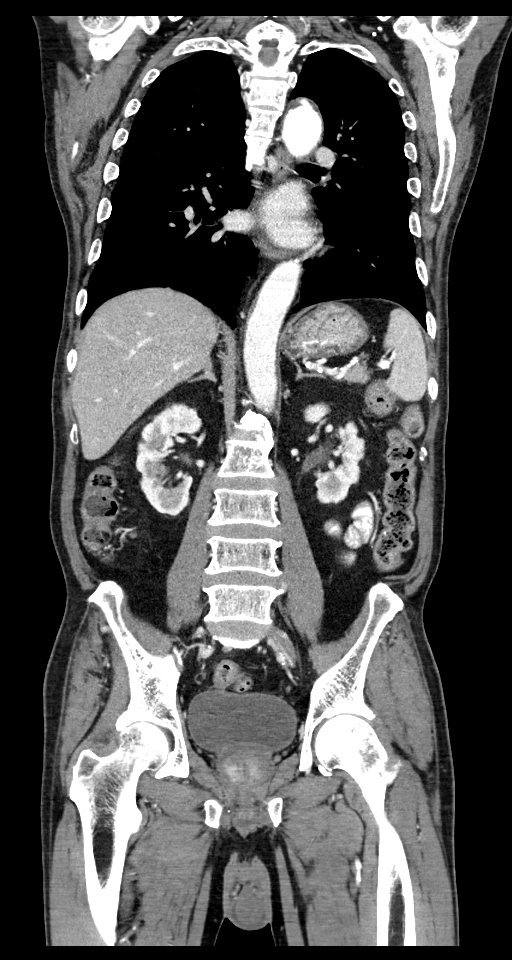
[im 79/132  mediastinal]
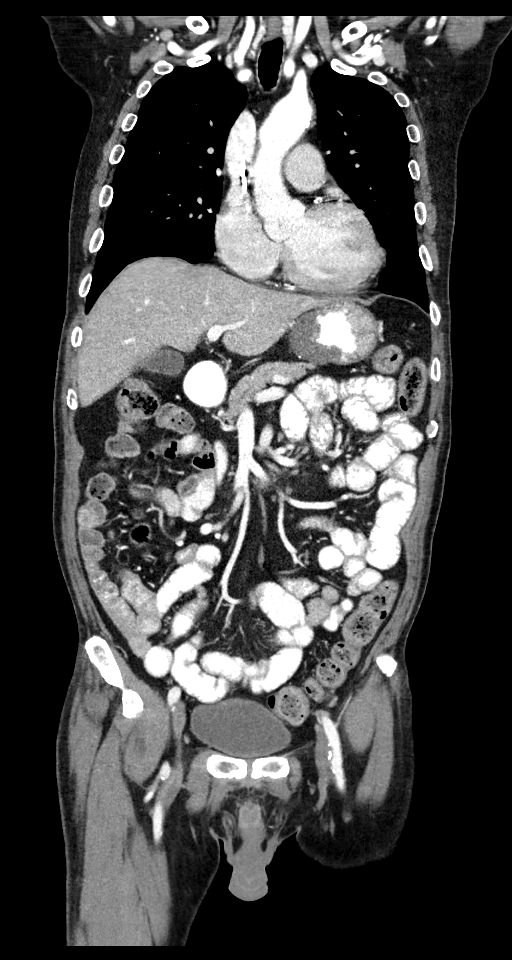

[13 of 36 positions shown; findings below may reference images not displayed]

FINDINGS: CT CHEST FINDINGS

Cardiovascular: The heart size is normal. No substantial pericardial
effusion. Coronary artery calcification is evident. Status post
CABG. Atherosclerotic calcification is noted in the wall of the
thoracic aorta.

Mediastinum/Nodes: 13 mm right thyroid nodule noted. No mediastinal
lymphadenopathy. There is no hilar lymphadenopathy. The esophagus
has normal imaging features. There is no axillary lymphadenopathy.

Lungs/Pleura: Centrilobular and paraseptal emphysema evident. 6 mm
right lower lobe perifissural nodule on 73/4 is stable since
[DATE], compatible with subpleural lymph node. No suspicious
pulmonary nodule or mass. No focal airspace consolidation. There is
no evidence of pleural effusion.

Musculoskeletal: Small sclerotic focus in the T1 vertebral body is
stable, likely a bone island small sclerotic focus posterior right
rib also unchanged.

CT ABDOMEN PELVIS FINDINGS

Hepatobiliary: Scattered small hypoattenuating liver lesions
characterize is metastatic on MRI of [DATE] are stable since CT
of [DATE]. Index lesion inferior right liver measured at 1.3 cm
on that study is 1.3 cm on image 62/2 today. No new liver lesion
evident. There is no evidence for gallstones, gallbladder wall
thickening, or pericholecystic fluid. No intrahepatic or
extrahepatic biliary dilation.

Pancreas: No focal mass lesion. No dilatation of the main duct. No
intraparenchymal cyst. No peripancreatic edema.

Spleen: No splenomegaly. No focal mass lesion.

Adrenals/Urinary Tract: No adrenal nodule or mass. Cortical scarring
again noted in both kidneys with stable 1.9 cm cyst in the posterior
lower pole right kidney. 1.5 cm anterior lower interpolar right
renal lesion measured previously is stable at 1.5 cm today ([DATE]).
This was characterized as hemorrhagic cyst on MRI [DATE]. No
evidence for hydroureter. The urinary bladder appears normal for the
degree of distention.

Stomach/Bowel: Similar appearance of the gastric mass measuring
x 5.3 cm today compared to 8.2 x 6.4 cm on [DATE]. Duodenum is
normally positioned as is the ligament of Treitz. No small bowel
wall thickening. No small bowel dilatation. The terminal ileum is
normal. The appendix is normal. No gross colonic mass. No colonic
wall thickening.

Vascular/Lymphatic: There is abdominal aortic atherosclerosis
without aneurysm. There is no gastrohepatic or hepatoduodenal
ligament lymphadenopathy. No retroperitoneal or mesenteric
lymphadenopathy. No pelvic sidewall lymphadenopathy.

Reproductive: Unremarkable.

Other: No intraperitoneal free fluid.

Musculoskeletal: No worrisome lytic or sclerotic osseous
abnormality.
IMPRESSION: 1. Stable exam. No new or progressive findings.
2. Dominant gastric mass measures minimally smaller today.
3. Stable appearance of scattered small hypoattenuating liver
lesions characterized as metastatic on MRI of [DATE].
[DATE]. Stable 1.5 cm anterior lower interpolar right renal lesion
consistent with hemorrhagic cyst on MRI [DATE].
[DATE]. [DATE] mm right thyroid nodule. Not clinically significant; no
follow-up imaging recommended (ref: [HOSPITAL]. [HE]
6. Aortic Atherosclerosis ([HE]-[HE]) and Emphysema ([HE]-[HE]).

## 2020-11-17 MED ORDER — IOHEXOL 300 MG/ML  SOLN
100.0000 mL | Freq: Once | INTRAMUSCULAR | Status: AC | PRN
  Administered 2020-11-17: 100 mL via INTRAVENOUS

## 2020-11-18 ENCOUNTER — Encounter: Payer: Self-pay | Admitting: *Deleted

## 2020-11-18 NOTE — Progress Notes (Signed)
Oncology Nurse Navigator Documentation  Oncology Nurse Navigator Flowsheets 11/18/2020  Abnormal Finding Date -  Diagnosis Status -  Planned Course of Treatment -  Phase of Treatment -  Chemotherapy Pending- Reason: -  Chemotherapy Actual Start Date: -  Navigator Follow Up Date: 11/21/2020  Navigator Follow Up Reason: Follow-up Appointment  Navigator Location [facility]  Referral Date to RadOnc/MedOnc -  Navigator Encounter Type Scan Review  Telephone -  Patient Visit Type MedOnc  Treatment Phase Active Tx  Barriers/Navigation Needs Coordination of Care;Education;Language/Communication  Education -  Interventions None Required  Acuity Level 2-Minimal Needs (1-2 Barriers Identified)  Coordination of Care -  Education Method -  Support Groups/Services Friends and Family  Time Spent with Patient 15

## 2020-11-21 ENCOUNTER — Encounter: Payer: Self-pay | Admitting: *Deleted

## 2020-11-21 ENCOUNTER — Inpatient Hospital Stay: Payer: Medicare Other | Admitting: Hematology & Oncology

## 2020-11-21 ENCOUNTER — Encounter: Payer: Self-pay | Admitting: Hematology & Oncology

## 2020-11-21 ENCOUNTER — Inpatient Hospital Stay: Payer: Medicare Other

## 2020-11-21 ENCOUNTER — Other Ambulatory Visit: Payer: Self-pay

## 2020-11-21 ENCOUNTER — Inpatient Hospital Stay: Payer: Medicare Other | Attending: Hematology & Oncology

## 2020-11-21 ENCOUNTER — Telehealth: Payer: Self-pay

## 2020-11-21 DIAGNOSIS — R739 Hyperglycemia, unspecified: Secondary | ICD-10-CM | POA: Diagnosis not present

## 2020-11-21 DIAGNOSIS — C169 Malignant neoplasm of stomach, unspecified: Secondary | ICD-10-CM

## 2020-11-21 DIAGNOSIS — D500 Iron deficiency anemia secondary to blood loss (chronic): Secondary | ICD-10-CM

## 2020-11-21 DIAGNOSIS — Z5112 Encounter for antineoplastic immunotherapy: Secondary | ICD-10-CM | POA: Diagnosis present

## 2020-11-21 DIAGNOSIS — C799 Secondary malignant neoplasm of unspecified site: Secondary | ICD-10-CM | POA: Diagnosis not present

## 2020-11-21 DIAGNOSIS — C165 Malignant neoplasm of lesser curvature of stomach, unspecified: Secondary | ICD-10-CM | POA: Insufficient documentation

## 2020-11-21 DIAGNOSIS — Z5111 Encounter for antineoplastic chemotherapy: Secondary | ICD-10-CM | POA: Insufficient documentation

## 2020-11-21 DIAGNOSIS — Z79899 Other long term (current) drug therapy: Secondary | ICD-10-CM | POA: Diagnosis not present

## 2020-11-21 DIAGNOSIS — C787 Secondary malignant neoplasm of liver and intrahepatic bile duct: Secondary | ICD-10-CM | POA: Diagnosis not present

## 2020-11-21 DIAGNOSIS — K922 Gastrointestinal hemorrhage, unspecified: Secondary | ICD-10-CM | POA: Diagnosis not present

## 2020-11-21 LAB — CBC WITH DIFFERENTIAL (CANCER CENTER ONLY)
Abs Immature Granulocytes: 0.18 10*3/uL — ABNORMAL HIGH (ref 0.00–0.07)
Basophils Absolute: 0 10*3/uL (ref 0.0–0.1)
Basophils Relative: 1 %
Eosinophils Absolute: 0.1 10*3/uL (ref 0.0–0.5)
Eosinophils Relative: 2 %
HCT: 28.8 % — ABNORMAL LOW (ref 39.0–52.0)
Hemoglobin: 8.9 g/dL — ABNORMAL LOW (ref 13.0–17.0)
Immature Granulocytes: 4 %
Lymphocytes Relative: 32 %
Lymphs Abs: 1.6 10*3/uL (ref 0.7–4.0)
MCH: 26.4 pg (ref 26.0–34.0)
MCHC: 30.9 g/dL (ref 30.0–36.0)
MCV: 85.5 fL (ref 80.0–100.0)
Monocytes Absolute: 0.6 10*3/uL (ref 0.1–1.0)
Monocytes Relative: 12 %
Neutro Abs: 2.6 10*3/uL (ref 1.7–7.7)
Neutrophils Relative %: 49 %
Platelet Count: 205 10*3/uL (ref 150–400)
RBC: 3.37 MIL/uL — ABNORMAL LOW (ref 4.22–5.81)
RDW: 18.2 % — ABNORMAL HIGH (ref 11.5–15.5)
WBC Count: 5.2 10*3/uL (ref 4.0–10.5)
nRBC: 0 % (ref 0.0–0.2)

## 2020-11-21 LAB — CMP (CANCER CENTER ONLY)
ALT: 18 U/L (ref 0–44)
AST: 18 U/L (ref 15–41)
Albumin: 3.4 g/dL — ABNORMAL LOW (ref 3.5–5.0)
Alkaline Phosphatase: 71 U/L (ref 38–126)
Anion gap: 8 (ref 5–15)
BUN: 12 mg/dL (ref 8–23)
CO2: 27 mmol/L (ref 22–32)
Calcium: 8.7 mg/dL — ABNORMAL LOW (ref 8.9–10.3)
Chloride: 105 mmol/L (ref 98–111)
Creatinine: 1.02 mg/dL (ref 0.61–1.24)
GFR, Estimated: >60 mL/min (ref >60)
Glucose, Bld: 179 mg/dL — ABNORMAL HIGH (ref 70–99)
Potassium: 3.6 mmol/L (ref 3.5–5.1)
Sodium: 140 mmol/L (ref 135–145)
Total Bilirubin: 0.4 mg/dL (ref 0.3–1.2)
Total Protein: 5.3 g/dL — ABNORMAL LOW (ref 6.5–8.1)

## 2020-11-21 LAB — IRON AND TIBC
Iron: 60 ug/dL (ref 42–163)
Saturation Ratios: 26 % (ref 20–55)
TIBC: 229 ug/dL (ref 202–409)
UIBC: 169 ug/dL (ref 117–376)

## 2020-11-21 LAB — TSH: TSH: 1.689 u[IU]/mL (ref 0.320–4.118)

## 2020-11-21 LAB — FERRITIN: Ferritin: 802 ng/mL — ABNORMAL HIGH (ref 24–336)

## 2020-11-21 MED ORDER — DEXTROSE 5 % IV SOLN
Freq: Once | INTRAVENOUS | Status: AC
  Filled 2020-11-21: qty 250

## 2020-11-21 MED ORDER — HEPARIN SOD (PORK) LOCK FLUSH 100 UNIT/ML IV SOLN
500.0000 [IU] | Freq: Once | INTRAVENOUS | Status: DC | PRN
  Filled 2020-11-21: qty 5

## 2020-11-21 MED ORDER — PALONOSETRON HCL INJECTION 0.25 MG/5ML
INTRAVENOUS | Status: AC
  Filled 2020-11-21: qty 5

## 2020-11-21 MED ORDER — ACETAMINOPHEN 325 MG PO TABS
650.0000 mg | ORAL_TABLET | Freq: Once | ORAL | Status: AC
Start: 2020-11-21 — End: 2020-11-21
  Administered 2020-11-21: 650 mg via ORAL

## 2020-11-21 MED ORDER — ACETAMINOPHEN 325 MG PO TABS
ORAL_TABLET | ORAL | Status: AC
  Filled 2020-11-21: qty 2

## 2020-11-21 MED ORDER — SODIUM CHLORIDE 0.9 % IV SOLN
2160.0000 mg/m2 | INTRAVENOUS | Status: DC
  Administered 2020-11-21: 3800 mg via INTRAVENOUS
  Filled 2020-11-21: qty 76

## 2020-11-21 MED ORDER — SODIUM CHLORIDE 0.9% FLUSH
10.0000 mL | INTRAVENOUS | Status: DC | PRN
  Filled 2020-11-21: qty 10

## 2020-11-21 MED ORDER — SODIUM CHLORIDE 0.9% FLUSH
10.0000 mL | INTRAVENOUS | Status: DC | PRN
Start: 2020-11-21 — End: 2020-11-21
  Filled 2020-11-21: qty 10

## 2020-11-21 MED ORDER — PALONOSETRON HCL INJECTION 0.25 MG/5ML
0.2500 mg | Freq: Once | INTRAVENOUS | Status: AC
  Administered 2020-11-21: 0.25 mg via INTRAVENOUS

## 2020-11-21 MED ORDER — DEXTROSE 5 % IV SOLN
400.0000 mg/m2 | Freq: Once | INTRAVENOUS | Status: AC
  Administered 2020-11-21: 700 mg via INTRAVENOUS
  Filled 2020-11-21: qty 35

## 2020-11-21 MED ORDER — DIPHENHYDRAMINE HCL 25 MG PO CAPS
ORAL_CAPSULE | ORAL | Status: AC
  Filled 2020-11-21: qty 2

## 2020-11-21 MED ORDER — TRASTUZUMAB-DKST CHEMO 150 MG IV SOLR
4.0000 mg/kg | Freq: Once | INTRAVENOUS | Status: AC
  Administered 2020-11-21: 252 mg via INTRAVENOUS
  Filled 2020-11-21: qty 12

## 2020-11-21 MED ORDER — DIPHENHYDRAMINE HCL 25 MG PO CAPS
50.0000 mg | ORAL_CAPSULE | Freq: Once | ORAL | Status: AC
  Administered 2020-11-21: 50 mg via ORAL

## 2020-11-21 MED ORDER — SODIUM CHLORIDE 0.9 % IV SOLN
10.0000 mg | Freq: Once | INTRAVENOUS | Status: AC
  Administered 2020-11-21: 10 mg via INTRAVENOUS
  Filled 2020-11-21: qty 10

## 2020-11-21 MED ORDER — OXALIPLATIN CHEMO INJECTION 100 MG/20ML
85.0000 mg/m2 | Freq: Once | INTRAVENOUS | Status: AC
  Administered 2020-11-21: 150 mg via INTRAVENOUS
  Filled 2020-11-21: qty 20

## 2020-11-21 MED ORDER — SODIUM CHLORIDE 0.9 % IV SOLN
480.0000 mg | Freq: Once | INTRAVENOUS | Status: AC
  Administered 2020-11-21: 480 mg via INTRAVENOUS
  Filled 2020-11-21: qty 48

## 2020-11-21 MED ORDER — FLUOROURACIL CHEMO INJECTION 2.5 GM/50ML
400.0000 mg/m2 | Freq: Once | INTRAVENOUS | Status: AC
  Administered 2020-11-21: 700 mg via INTRAVENOUS
  Filled 2020-11-21: qty 14

## 2020-11-21 MED ORDER — SODIUM CHLORIDE 0.9 % IV SOLN
Freq: Once | INTRAVENOUS | Status: AC
  Filled 2020-11-21: qty 250

## 2020-11-21 NOTE — Progress Notes (Signed)
Oncology Nurse Navigator Documentation  Oncology Nurse Navigator Flowsheets 11/21/2020  Abnormal Finding Date -  Diagnosis Status -  Planned Course of Treatment -  Phase of Treatment -  Chemotherapy Pending- Reason: -  Chemotherapy Actual Start Date: -  Navigator Follow Up Date: -  Navigator Follow Up Reason: -  Navigation Complete Date: 11/21/2020  Post Navigation: Continue to Follow Patient? No  Reason Not Navigating Patient: Patient On Maintenance Chemotherapy  Navigator Location [facility]  Referral Date to RadOnc/MedOnc -  Navigator Encounter Type Appt/Treatment Plan Review  Telephone -  Patient Visit Type MedOnc  Treatment Phase Active Tx  Barriers/Navigation Needs Coordination of Care;Education;Language/Communication  Education -  Interventions None Required  Acuity Level 2-Minimal Needs (1-2 Barriers Identified)  Coordination of Care -  Education Method -  Support Groups/Services Friends and Family  Time Spent with Patient -

## 2020-11-21 NOTE — Telephone Encounter (Signed)
appts made per 11/21/20 los  and pt to gain appt in tx/avs mychart   anne

## 2020-11-21 NOTE — Progress Notes (Signed)
Hematology and Oncology Follow Up Visit  Jimmy Blankenship [id number] August 17, 1951 70 y.o. 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  He looks pretty good.  He is eating okay.  He is having no problems with bleeding.  His blood sugars are still on the high side.  We did go ahead and repeat a CT scan on him.  This was done on 11/17/2020.  Is does show that he is responding.  The gastric mass now measures 7.7 x 5.3 cm.  It previously measured 8.2 x 6.4 cm.  He does have the liver metastasis but these do not appear to be as prominent.  There is no adenopathy noted.  Again, I think he is responding.  As such, we have to continue him on treatment.  He has had no problems with neuropathy.  He has had no rashes.  He has had no nausea or vomiting.  Has had some mucositis but this is resolved with mouth rinses.  He has had no headache.  There is been no visual changes.  He will want to go to [place] at some point this year.  I think we can probably get him going once we have him on a maintenance type of program.  Currently, his performance status is ECOG 1.    Medications:  Current Outpatient Medications:  .  atorvastatin (LIPITOR) 80 MG tablet, Take 1 tablet (80 mg total) by mouth daily., Disp: 90 tablet, Rfl: 3 .  clopidogrel (PLAVIX) 75 MG tablet, Take 75 mg by mouth daily., Disp: , Rfl:  .  Cyanocobalamin (VITAMIN B-12 PO), Take 1 tablet by mouth daily., Disp: , Rfl:  .  dexamethasone (DECADRON) 4 MG tablet, Take 2 tablets (8 mg total) by mouth daily. Start the day after chemotherapy for 2 days. Take with food., Disp: 30 tablet, Rfl: 1 .  feeding supplement (ENSURE ENLIVE /  ENSURE PLUS) LIQD, Take 237 mLs by mouth 2 (two) times daily between meals., Disp: 237 mL, Rfl: 12 .  gabapentin (NEURONTIN) 300 MG capsule, Take 300 mg by mouth daily., Disp: , Rfl:  .  glimepiride (AMARYL) 2 MG tablet, Take 1 tablet (2 mg total) by mouth daily with breakfast., Disp: 30 tablet, Rfl: 0 .  lidocaine-prilocaine (EMLA) cream, Apply to affected area once, Disp: 30 g, Rfl: 3 .  metFORMIN (GLUCOPHAGE) 1000 MG tablet, Take 1,000 mg by mouth 2 (two) times daily with a meal., Disp: , Rfl:  .  metoprolol tartrate (LOPRESSOR) 50 MG tablet, TAKE ONE TABLET BY MOUTH TWICE A DAY, Disp: 180 tablet, Rfl: 3 .  ondansetron (ZOFRAN) 8 MG tablet, Take 1 tablet (8 mg total) by mouth 2 (two) times daily as needed for refractory nausea / vomiting. Start on day 3 after chemotherapy., Disp: 30 tablet, Rfl: 1 .  pantoprazole (PROTONIX) 40 MG tablet, Take 1 tablet (40 mg total) by mouth 2 (two) times daily., Disp: 60 tablet, Rfl: 1 .  prochlorperazine (COMPAZINE) 10 MG tablet, Take 1 tablet (10 mg total) by mouth every 6 (six) hours as needed (Nausea or vomiting)., Disp: 30 tablet, Rfl: 1  Allergies: No Known Allergies  Past Medical History, Surgical history, Social history, and  Family History were reviewed and updated.  Review of Systems: Review of Systems  Constitutional: Negative.   HENT:  Negative.   Eyes: Negative.   Respiratory: Negative.   Cardiovascular: Negative.   Endocrine: Negative.   Genitourinary: Negative.    Musculoskeletal: Negative.   Skin: Negative.   Neurological: Negative.   Hematological: Negative.   Psychiatric/Behavioral: Negative.     Physical Exam:  vitals were not taken for this visit.   Wt Readings from Last 3 Encounters:  10/31/20 144 lb (65.3 kg)  10/21/20 140 lb 6.4 oz (63.7 kg)  10/17/20 146 lb (66.2 kg)    Physical Exam Vitals reviewed.  HENT:     Head: Normocephalic and atraumatic.  Eyes:     Pupils: Pupils are equal, round, and reactive to light.   Cardiovascular:     Rate and Rhythm: Normal rate and regular rhythm.     Heart sounds: Normal heart sounds.  Pulmonary:     Effort: Pulmonary effort is normal.     Breath sounds: Normal breath sounds.  Abdominal:     General: Bowel sounds are normal.     Palpations: Abdomen is soft.  Musculoskeletal:        General: No tenderness or deformity. Normal range of motion.     Cervical back: Normal range of motion.  Lymphadenopathy:     Cervical: No cervical adenopathy.  Skin:    General: Skin is warm and dry.     Findings: No erythema or rash.  Neurological:     Mental Status: He is alert and oriented to person, place, and time.  Psychiatric:        Behavior: Behavior normal.        Thought Content: Thought content normal.        Judgment: Judgment normal.    Lab Results  Component Value Date   WBC 5.2 11/21/2020   HGB 8.9 (L) 11/21/2020   HCT 28.8 (L) 11/21/2020   MCV 85.5 11/21/2020   PLT 205 11/21/2020     Chemistry      Component Value Date/Time   NA 140 11/21/2020 0920   NA 140 06/10/2020 0935   K 3.6 11/21/2020 0920   CL 105 11/21/2020 0920   CO2 27 11/21/2020 0920   BUN 12 11/21/2020 0920   BUN 17 06/10/2020 0935   CREATININE 1.02 11/21/2020 0920      Component Value Date/Time   CALCIUM 8.7 (L) 11/21/2020 0920   ALKPHOS 71 11/21/2020 0920   AST 18 11/21/2020 0920   ALT 18 11/21/2020 0920   BILITOT 0.4 11/21/2020 0920      Impression and Plan: Jimmy Blankenship is a very nice 71 year old [name] male.  He has metastatic adenocarcinoma of the stomach.  He has had 3 cycles of chemotherapy along with immunotherapy and targeted therapy.    I we will go ahead and do his fifth cycle of treatment.  I told he and his daughter that his blood sugars have to be under good control.  Sometimes, with elevated blood sugars, this might blunt the effect of treatment.  I will give 4 more cycles of treatment, including this 1, and then repeat his scans.  After that, we might  consider some type of maintenance protocol for him.  Given that he has a HER-2 positive cancer, we might want to consider Enhertu for this malignancy as a maintenance protocol.    Jimmy Napoleon, MD 3/14/202210:44 AM

## 2020-11-22 LAB — T4: T4, Total: 6.6 ug/dL (ref 4.5–12.0)

## 2020-11-23 ENCOUNTER — Inpatient Hospital Stay: Payer: Medicare Other

## 2020-11-23 ENCOUNTER — Other Ambulatory Visit: Payer: Self-pay

## 2020-11-23 VITALS — BP 119/64 | HR 94 | Temp 98.4°F | Resp 18

## 2020-11-23 DIAGNOSIS — C165 Malignant neoplasm of lesser curvature of stomach, unspecified: Secondary | ICD-10-CM

## 2020-11-23 DIAGNOSIS — Z5112 Encounter for antineoplastic immunotherapy: Secondary | ICD-10-CM | POA: Diagnosis not present

## 2020-11-23 DIAGNOSIS — C799 Secondary malignant neoplasm of unspecified site: Secondary | ICD-10-CM

## 2020-11-23 DIAGNOSIS — C169 Malignant neoplasm of stomach, unspecified: Secondary | ICD-10-CM

## 2020-11-23 MED ORDER — HEPARIN SOD (PORK) LOCK FLUSH 100 UNIT/ML IV SOLN
500.0000 [IU] | Freq: Once | INTRAVENOUS | Status: AC | PRN
  Administered 2020-11-23: 500 [IU]
  Filled 2020-11-23: qty 5

## 2020-11-23 MED ORDER — SODIUM CHLORIDE 0.9% FLUSH
10.0000 mL | INTRAVENOUS | Status: DC | PRN
  Administered 2020-11-23: 10 mL
  Filled 2020-11-23: qty 10

## 2020-11-23 NOTE — Patient Instructions (Signed)
Implanted Port Insertion, Care After This sheet gives you information about how to care for yourself after your procedure. Your health care provider may also give you more specific instructions. If you have problems or questions, contact your health care provider. What can I expect after the procedure? After the procedure, it is common to have:  Discomfort at the port insertion site.  Bruising on the skin over the port. This should improve over 3-4 days. Follow these instructions at home: Port care  After your port is placed, you will get a manufacturer's information card. The card has information about your port. Keep this card with you at all times.  Take care of the port as told by your health care provider. Ask your health care provider if you or a family member can get training for taking care of the port at home. A home health care nurse may also take care of the port.  Make sure to remember what type of port you have. Incision care  Follow instructions from your health care provider about how to take care of your port insertion site. Make sure you: ? Wash your hands with soap and water before and after you change your bandage (dressing). If soap and water are not available, use hand sanitizer. ? Change your dressing as told by your health care provider. ? Leave stitches (sutures), skin glue, or adhesive strips in place. These skin closures may need to stay in place for 2 weeks or longer. If adhesive strip edges start to loosen and curl up, you may trim the loose edges. Do not remove adhesive strips completely unless your health care provider tells you to do that.  Check your port insertion site every day for signs of infection. Check for: ? Redness, swelling, or pain. ? Fluid or blood. ? Warmth. ? Pus or a bad smell.      Activity  Return to your normal activities as told by your health care provider. Ask your health care provider what activities are safe for you.  Do not  lift anything that is heavier than 10 lb (4.5 kg), or the limit that you are told, until your health care provider says that it is safe. General instructions  Take over-the-counter and prescription medicines only as told by your health care provider.  Do not take baths, swim, or use a hot tub until your health care provider approves. Ask your health care provider if you may take showers. You may only be allowed to take sponge baths.  Do not drive for 24 hours if you were given a sedative during your procedure.  Wear a medical alert bracelet in case of an emergency. This will tell any health care providers that you have a port.  Keep all follow-up visits as told by your health care provider. This is important. Contact a health care provider if:  You cannot flush your port with saline as directed, or you cannot draw blood from the port.  You have a fever or chills.  You have redness, swelling, or pain around your port insertion site.  You have fluid or blood coming from your port insertion site.  Your port insertion site feels warm to the touch.  You have pus or a bad smell coming from the port insertion site. Get help right away if:  You have chest pain or shortness of breath.  You have bleeding from your port that you cannot control. Summary  Take care of the port as told by your   health care provider. Keep the manufacturer's information card with you at all times.  Change your dressing as told by your health care provider.  Contact a health care provider if you have a fever or chills or if you have redness, swelling, or pain around your port insertion site.  Keep all follow-up visits as told by your health care provider. This information is not intended to replace advice given to you by your health care provider. Make sure you discuss any questions you have with your health care provider. Document Revised: 03/25/2018 Document Reviewed: 03/25/2018 Elsevier Patient Education   2021 Elsevier Inc.  

## 2020-12-05 ENCOUNTER — Encounter: Payer: Self-pay | Admitting: Hematology & Oncology

## 2020-12-05 ENCOUNTER — Other Ambulatory Visit: Payer: Self-pay | Admitting: *Deleted

## 2020-12-05 DIAGNOSIS — C799 Secondary malignant neoplasm of unspecified site: Secondary | ICD-10-CM

## 2020-12-05 DIAGNOSIS — C165 Malignant neoplasm of lesser curvature of stomach, unspecified: Secondary | ICD-10-CM

## 2020-12-05 MED ORDER — PROCHLORPERAZINE MALEATE 10 MG PO TABS: 10.0000 mg | ORAL_TABLET | Freq: Four times a day (QID) | ORAL | 1 refills | Status: DC | PRN

## 2020-12-05 MED ORDER — ONDANSETRON HCL 8 MG PO TABS: 8.0000 mg | ORAL_TABLET | Freq: Two times a day (BID) | ORAL | 1 refills | Status: DC | PRN

## 2020-12-05 MED ORDER — DEXAMETHASONE 4 MG PO TABS: 8.0000 mg | ORAL_TABLET | Freq: Every day | ORAL | 1 refills | Status: DC

## 2020-12-07 ENCOUNTER — Encounter: Payer: Self-pay | Admitting: Hematology & Oncology

## 2020-12-07 ENCOUNTER — Inpatient Hospital Stay: Payer: Medicare Other

## 2020-12-07 ENCOUNTER — Telehealth: Payer: Self-pay

## 2020-12-07 ENCOUNTER — Other Ambulatory Visit: Payer: Self-pay

## 2020-12-07 ENCOUNTER — Inpatient Hospital Stay (HOSPITAL_BASED_OUTPATIENT_CLINIC_OR_DEPARTMENT_OTHER): Payer: Medicare Other | Admitting: Hematology & Oncology

## 2020-12-07 VITALS — BP 136/68 | HR 72 | Temp 98.9°F | Resp 18 | Wt 145.0 lb

## 2020-12-07 DIAGNOSIS — C165 Malignant neoplasm of lesser curvature of stomach, unspecified: Secondary | ICD-10-CM

## 2020-12-07 DIAGNOSIS — D500 Iron deficiency anemia secondary to blood loss (chronic): Secondary | ICD-10-CM

## 2020-12-07 DIAGNOSIS — C799 Secondary malignant neoplasm of unspecified site: Secondary | ICD-10-CM

## 2020-12-07 DIAGNOSIS — C169 Malignant neoplasm of stomach, unspecified: Secondary | ICD-10-CM

## 2020-12-07 DIAGNOSIS — Z5112 Encounter for antineoplastic immunotherapy: Secondary | ICD-10-CM | POA: Diagnosis not present

## 2020-12-07 LAB — CMP (CANCER CENTER ONLY)
ALT: 17 U/L (ref 0–44)
AST: 16 U/L (ref 15–41)
Albumin: 3.5 g/dL (ref 3.5–5.0)
Alkaline Phosphatase: 71 U/L (ref 38–126)
Anion gap: 9 (ref 5–15)
BUN: 15 mg/dL (ref 8–23)
CO2: 27 mmol/L (ref 22–32)
Calcium: 8.8 mg/dL — ABNORMAL LOW (ref 8.9–10.3)
Chloride: 105 mmol/L (ref 98–111)
Creatinine: 0.9 mg/dL (ref 0.61–1.24)
GFR, Estimated: >60 mL/min (ref >60)
Glucose, Bld: 191 mg/dL — ABNORMAL HIGH (ref 70–99)
Potassium: 3.4 mmol/L — ABNORMAL LOW (ref 3.5–5.1)
Sodium: 141 mmol/L (ref 135–145)
Total Bilirubin: 0.5 mg/dL (ref 0.3–1.2)
Total Protein: 5.7 g/dL — ABNORMAL LOW (ref 6.5–8.1)

## 2020-12-07 LAB — CBC WITH DIFFERENTIAL (CANCER CENTER ONLY)
Abs Immature Granulocytes: 0.07 10*3/uL (ref 0.00–0.07)
Basophils Absolute: 0 10*3/uL (ref 0.0–0.1)
Basophils Relative: 1 %
Eosinophils Absolute: 0.2 10*3/uL (ref 0.0–0.5)
Eosinophils Relative: 7 %
HCT: 30.2 % — ABNORMAL LOW (ref 39.0–52.0)
Hemoglobin: 9.4 g/dL — ABNORMAL LOW (ref 13.0–17.0)
Immature Granulocytes: 3 %
Lymphocytes Relative: 32 %
Lymphs Abs: 0.8 10*3/uL (ref 0.7–4.0)
MCH: 27.3 pg (ref 26.0–34.0)
MCHC: 31.1 g/dL (ref 30.0–36.0)
MCV: 87.8 fL (ref 80.0–100.0)
Monocytes Absolute: 0.8 10*3/uL (ref 0.1–1.0)
Monocytes Relative: 32 %
Neutro Abs: 0.6 10*3/uL — ABNORMAL LOW (ref 1.7–7.7)
Neutrophils Relative %: 25 %
Platelet Count: 158 10*3/uL (ref 150–400)
RBC: 3.44 MIL/uL — ABNORMAL LOW (ref 4.22–5.81)
RDW: 18.6 % — ABNORMAL HIGH (ref 11.5–15.5)
WBC Count: 2.6 10*3/uL — ABNORMAL LOW (ref 4.0–10.5)
nRBC: 1.2 % — ABNORMAL HIGH (ref 0.0–0.2)

## 2020-12-07 LAB — FERRITIN: Ferritin: 555 ng/mL — ABNORMAL HIGH (ref 24–336)

## 2020-12-07 LAB — IRON AND TIBC
Iron: 63 ug/dL (ref 42–163)
Saturation Ratios: 25 % (ref 20–55)
TIBC: 251 ug/dL (ref 202–409)
UIBC: 188 ug/dL (ref 117–376)

## 2020-12-07 LAB — TSH: TSH: 0.692 u[IU]/mL (ref 0.320–4.118)

## 2020-12-07 MED ORDER — SODIUM CHLORIDE 0.9 % IV SOLN
Freq: Once | INTRAVENOUS | Status: AC
Start: 2020-12-07 — End: 2020-12-07
  Filled 2020-12-07: qty 250

## 2020-12-07 MED ORDER — FLUOROURACIL CHEMO INJECTION 2.5 GM/50ML
400.0000 mg/m2 | Freq: Once | INTRAVENOUS | Status: AC
  Administered 2020-12-07: 700 mg via INTRAVENOUS
  Filled 2020-12-07: qty 14

## 2020-12-07 MED ORDER — PALONOSETRON HCL INJECTION 0.25 MG/5ML
INTRAVENOUS | Status: AC
  Filled 2020-12-07: qty 5

## 2020-12-07 MED ORDER — SODIUM CHLORIDE 0.9 % IV SOLN
10.0000 mg | Freq: Once | INTRAVENOUS | Status: AC
  Administered 2020-12-07: 10 mg via INTRAVENOUS
  Filled 2020-12-07: qty 10

## 2020-12-07 MED ORDER — DIPHENHYDRAMINE HCL 25 MG PO CAPS
ORAL_CAPSULE | ORAL | Status: AC
  Filled 2020-12-07: qty 2

## 2020-12-07 MED ORDER — PALONOSETRON HCL INJECTION 0.25 MG/5ML
0.2500 mg | Freq: Once | INTRAVENOUS | Status: AC
  Administered 2020-12-07: 0.25 mg via INTRAVENOUS

## 2020-12-07 MED ORDER — DIPHENHYDRAMINE HCL 25 MG PO CAPS
50.0000 mg | ORAL_CAPSULE | Freq: Once | ORAL | Status: AC
  Administered 2020-12-07: 50 mg via ORAL

## 2020-12-07 MED ORDER — DEXTROSE 5 % IV SOLN
Freq: Once | INTRAVENOUS | Status: AC
  Filled 2020-12-07: qty 250

## 2020-12-07 MED ORDER — SODIUM CHLORIDE 0.9 % IV SOLN
2160.0000 mg/m2 | INTRAVENOUS | Status: DC
  Administered 2020-12-07: 3800 mg via INTRAVENOUS
  Filled 2020-12-07: qty 76

## 2020-12-07 MED ORDER — TRASTUZUMAB-DKST CHEMO 150 MG IV SOLR
4.0000 mg/kg | Freq: Once | INTRAVENOUS | Status: AC
  Administered 2020-12-07: 252 mg via INTRAVENOUS
  Filled 2020-12-07: qty 12

## 2020-12-07 MED ORDER — LEUCOVORIN CALCIUM INJECTION 350 MG
400.0000 mg/m2 | Freq: Once | INTRAVENOUS | Status: AC
  Administered 2020-12-07: 700 mg via INTRAVENOUS
  Filled 2020-12-07: qty 35

## 2020-12-07 MED ORDER — ACETAMINOPHEN 325 MG PO TABS
650.0000 mg | ORAL_TABLET | Freq: Once | ORAL | Status: AC
  Administered 2020-12-07: 650 mg via ORAL

## 2020-12-07 MED ORDER — GABAPENTIN 300 MG PO CAPS: 300.0000 mg | ORAL_CAPSULE | Freq: Two times a day (BID) | ORAL | 5 refills | Status: DC

## 2020-12-07 MED ORDER — ACETAMINOPHEN 325 MG PO TABS
ORAL_TABLET | ORAL | Status: AC
  Filled 2020-12-07: qty 2

## 2020-12-07 MED ORDER — OXALIPLATIN CHEMO INJECTION 100 MG/20ML
85.0000 mg/m2 | Freq: Once | INTRAVENOUS | Status: AC
  Administered 2020-12-07: 150 mg via INTRAVENOUS
  Filled 2020-12-07: qty 20

## 2020-12-07 NOTE — Telephone Encounter (Signed)
appts made per 12/07/20 los and pt will rec appts in tx/avs    Lee Kalt

## 2020-12-07 NOTE — Patient Instructions (Signed)

## 2020-12-07 NOTE — Progress Notes (Signed)
Ok to treat with ANC of 0.6 per Dr Marin Olp. dph

## 2020-12-07 NOTE — Progress Notes (Signed)
Hematology and Oncology Follow Up Visit  Jimmy Blankenship [id number] [birth year] 70 y.o. 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  He looks pretty good.  He is eating okay.  He is having no problems with bleeding.  He seems to be having no issues with bowels or bladder.  Is no diarrhea or constipation.  He has had no leg swelling.  He has had no rashes.  He has had no headache.  He has been tolerating the chemotherapy fairly well.  He actually is still working a little bit.  Overall, I would have to say his performance status is ECOG 1.  Medications:  Current Outpatient Medications:  .  atorvastatin (LIPITOR) 80 MG tablet, Take 1 tablet (80 mg total) by mouth daily., Disp: 90 tablet, Rfl: 3 .  clopidogrel (PLAVIX) 75 MG tablet, Take 75 mg by mouth daily., Disp: , Rfl:  .  Cyanocobalamin (VITAMIN B-12 PO), Take 1 tablet by mouth daily., Disp: , Rfl:  .  dexamethasone (DECADRON) 4 MG tablet, Take 2 tablets (8 mg total) by mouth daily. Start the day after chemotherapy for 2 days. Take with food., Disp: 30 tablet, Rfl: 1 .  feeding supplement (ENSURE ENLIVE / ENSURE PLUS) LIQD, Take 237 mLs by mouth 2 (two) times daily between meals., Disp: 237 mL, Rfl: 12 .  gabapentin (NEURONTIN) 300 MG capsule, Take 300 mg by mouth daily., Disp: , Rfl:  .  glimepiride (AMARYL) 2 MG tablet, Take 1 tablet (2 mg total) by mouth daily with breakfast., Disp: 30 tablet, Rfl: 0 .  lidocaine-prilocaine (EMLA) cream, Apply to affected area once, Disp: 30 g, Rfl: 3 .  metFORMIN (GLUCOPHAGE) 1000 MG tablet, Take 1,000 mg by mouth 2 (two) times daily with a meal., Disp: , Rfl:  .  metoprolol  tartrate (LOPRESSOR) 50 MG tablet, TAKE ONE TABLET BY MOUTH TWICE A DAY, Disp: 180 tablet, Rfl: 3 .  ondansetron (ZOFRAN) 8 MG tablet, Take 1 tablet (8 mg total) by mouth 2 (two) times daily as needed for refractory nausea / vomiting. Start on day 3 after chemotherapy., Disp: 30 tablet, Rfl: 1 .  pantoprazole (PROTONIX) 40 MG tablet, Take 1 tablet (40 mg total) by mouth 2 (two) times daily., Disp: 60 tablet, Rfl: 1 .  prochlorperazine (COMPAZINE) 10 MG tablet, Take 1 tablet (10 mg total) by mouth every 6 (six) hours as needed (Nausea or vomiting)., Disp: 30 tablet, Rfl: 1  Allergies: No Known Allergies  Past Medical History, Surgical history, Social history, and Family History were reviewed and updated.  Review of Systems: Review of Systems  Constitutional: Negative.   HENT:  Negative.   Eyes: Negative.   Respiratory: Negative.   Cardiovascular: Negative.   Endocrine: Negative.   Genitourinary: Negative.    Musculoskeletal: Negative.   Skin: Negative.   Neurological: Negative.   Hematological: Negative.   Psychiatric/Behavioral: Negative.     Physical Exam:  weight is 145 lb (65.8 kg). His oral temperature is 98.9 F (37.2 C). His blood pressure is 136/68 and his pulse is 72. His respiration is 18 and oxygen saturation is 100%.   Wt Readings from Last 3 Encounters:  12/07/20 145 lb (  65.8 kg)  10/31/20 144 lb (65.3 kg)  10/21/20 140 lb 6.4 oz (63.7 kg)    Physical Exam Vitals reviewed.  HENT:     Head: Normocephalic and atraumatic.  Eyes:     Pupils: Pupils are equal, round, and reactive to light.  Cardiovascular:     Rate and Rhythm: Normal rate and regular rhythm.     Heart sounds: Normal heart sounds.  Pulmonary:     Effort: Pulmonary effort is normal.     Breath sounds: Normal breath sounds.  Abdominal:     General: Bowel sounds are normal.     Palpations: Abdomen is soft.  Musculoskeletal:        General: No tenderness or deformity. Normal range of motion.      Cervical back: Normal range of motion.  Lymphadenopathy:     Cervical: No cervical adenopathy.  Skin:    General: Skin is warm and dry.     Findings: No erythema or rash.  Neurological:     Mental Status: He is alert and oriented to person, place, and time.  Psychiatric:        Behavior: Behavior normal.        Thought Content: Thought content normal.        Judgment: Judgment normal.    Lab Results  Component Value Date   WBC 2.6 (L) 12/07/2020   HGB 9.4 (L) 12/07/2020   HCT 30.2 (L) 12/07/2020   MCV 87.8 12/07/2020   PLT 158 12/07/2020     Chemistry      Component Value Date/Time   NA 140 11/21/2020 0920   NA 140 06/10/2020 0935   K 3.6 11/21/2020 0920   CL 105 11/21/2020 0920   CO2 27 11/21/2020 0920   BUN 12 11/21/2020 0920   BUN 17 06/10/2020 0935   CREATININE 1.02 11/21/2020 0920      Component Value Date/Time   CALCIUM 8.7 (L) 11/21/2020 0920   ALKPHOS 71 11/21/2020 0920   AST 18 11/21/2020 0920   ALT 18 11/21/2020 0920   BILITOT 0.4 11/21/2020 0920      Impression and Plan: Jimmy Blankenship is a very nice 69 year old [name] male.  He has metastatic adenocarcinoma of the stomach.  He has had 5 cycles of chemotherapy along with immunotherapy and targeted therapy.    I we will go ahead and do his 6th cycle of treatment.   He is still planning on going to [place].  He wants to go I think in May or June.  Hopefully, we will be able to get him there.  I will plan to see him back in another couple weeks.  Volanda Napoleon, MD 3/30/20228:58 AM

## 2020-12-07 NOTE — Patient Instructions (Signed)
Fluorouracil, 5-FU injection What is this medicine? FLUOROURACIL, 5-FU (flure oh YOOR a sil) is a chemotherapy drug. It slows the growth of cancer cells. This medicine is used to treat many types of cancer like breast cancer, colon or rectal cancer, pancreatic cancer, and stomach cancer. This medicine may be used for other purposes; ask your health care provider or pharmacist if you have questions. COMMON BRAND NAME(S): Adrucil What should I tell my health care provider before I take this medicine? They need to know if you have any of these conditions:  blood disorders  dihydropyrimidine dehydrogenase (DPD) deficiency  infection (especially a virus infection such as chickenpox, cold sores, or herpes)  kidney disease  liver disease  malnourished, poor nutrition  recent or ongoing radiation therapy  an unusual or allergic reaction to fluorouracil, other chemotherapy, other medicines, foods, dyes, or preservatives  pregnant or trying to get pregnant  breast-feeding How should I use this medicine? This drug is given as an infusion or injection into a vein. It is administered in a hospital or clinic by a specially trained health care professional. Talk to your pediatrician regarding the use of this medicine in children. Special care may be needed. Overdosage: If you think you have taken too much of this medicine contact a poison control center or emergency room at once. NOTE: This medicine is only for you. Do not share this medicine with others. What if I miss a dose? It is important not to miss your dose. Call your doctor or health care professional if you are unable to keep an appointment. What may interact with this medicine? Do not take this medicine with any of the following medications:  live virus vaccines This medicine may also interact with the following medications:  medicines that treat or prevent blood clots like warfarin, enoxaparin, and dalteparin This list may not  describe all possible interactions. Give your health care provider a list of all the medicines, herbs, non-prescription drugs, or dietary supplements you use. Also tell them if you smoke, drink alcohol, or use illegal drugs. Some items may interact with your medicine. What should I watch for while using this medicine? Visit your doctor for checks on your progress. This drug may make you feel generally unwell. This is not uncommon, as chemotherapy can affect healthy cells as well as cancer cells. Report any side effects. Continue your course of treatment even though you feel ill unless your doctor tells you to stop. In some cases, you may be given additional medicines to help with side effects. Follow all directions for their use. Call your doctor or health care professional for advice if you get a fever, chills or sore throat, or other symptoms of a cold or flu. Do not treat yourself. This drug decreases your body's ability to fight infections. Try to avoid being around people who are sick. This medicine may increase your risk to bruise or bleed. Call your doctor or health care professional if you notice any unusual bleeding. Be careful brushing and flossing your teeth or using a toothpick because you may get an infection or bleed more easily. If you have any dental work done, tell your dentist you are receiving this medicine. Avoid taking products that contain aspirin, acetaminophen, ibuprofen, naproxen, or ketoprofen unless instructed by your doctor. These medicines may hide a fever. Do not become pregnant while taking this medicine. Women should inform their doctor if they wish to become pregnant or think they might be pregnant. There is a potential   for serious side effects to an unborn child. Talk to your health care professional or pharmacist for more information. Do not breast-feed an infant while taking this medicine. Men should inform their doctor if they wish to father a child. This medicine may  lower sperm counts. Do not treat diarrhea with over the counter products. Contact your doctor if you have diarrhea that lasts more than 2 days or if it is severe and watery. This medicine can make you more sensitive to the sun. Keep out of the sun. If you cannot avoid being in the sun, wear protective clothing and use sunscreen. Do not use sun lamps or tanning beds/booths. What side effects may I notice from receiving this medicine? Side effects that you should report to your doctor or health care professional as soon as possible:  allergic reactions like skin rash, itching or hives, swelling of the face, lips, or tongue  low blood counts - this medicine may decrease the number of white blood cells, red blood cells and platelets. You may be at increased risk for infections and bleeding.  signs of infection - fever or chills, cough, sore throat, pain or difficulty passing urine  signs of decreased platelets or bleeding - bruising, pinpoint red spots on the skin, black, tarry stools, blood in the urine  signs of decreased red blood cells - unusually weak or tired, fainting spells, lightheadedness  breathing problems  changes in vision  chest pain  mouth sores  nausea and vomiting  pain, swelling, redness at site where injected  pain, tingling, numbness in the hands or feet  redness, swelling, or sores on hands or feet  stomach pain  unusual bleeding Side effects that usually do not require medical attention (report to your doctor or health care professional if they continue or are bothersome):  changes in finger or toe nails  diarrhea  dry or itchy skin  hair loss  headache  loss of appetite  sensitivity of eyes to the light  stomach upset  unusually teary eyes This list may not describe all possible side effects. Call your doctor for medical advice about side effects. You may report side effects to FDA at 1-800-FDA-1088. Where should I keep my medicine? This  drug is given in a hospital or clinic and will not be stored at home. NOTE: This sheet is a summary. It may not cover all possible information. If you have questions about this medicine, talk to your doctor, pharmacist, or health care provider.  2021 [employer] Standard (2019-07-28 15:00:03) Leucovorin injection What is this medicine? LEUCOVORIN (loo koe VOR in) is used to prevent or treat the harmful effects of some medicines. This medicine is used to treat anemia caused by a low amount of folic acid in the body. It is also used with 5-fluorouracil (5-FU) to treat colon cancer. This medicine may be used for other purposes; ask your health care provider or pharmacist if you have questions. What should I tell my health care provider before I take this medicine? They need to know if you have any of these conditions:  anemia from low levels of vitamin B-12 in the blood  an unusual or allergic reaction to leucovorin, folic acid, other medicines, foods, dyes, or preservatives  pregnant or trying to get pregnant  breast-feeding How should I use this medicine? This medicine is for injection into a muscle or into a vein. It is given by a health care professional in a hospital or clinic setting. Talk to your pediatrician   regarding the use of this medicine in children. Special care may be needed. Overdosage: If you think you have taken too much of this medicine contact a poison control center or emergency room at once. NOTE: This medicine is only for you. Do not share this medicine with others. What if I miss a dose? This does not apply. What may interact with this medicine?  capecitabine  fluorouracil  phenobarbital  phenytoin  primidone  trimethoprim-sulfamethoxazole This list may not describe all possible interactions. Give your health care provider a list of all the medicines, herbs, non-prescription drugs, or dietary supplements you use. Also tell them if you smoke, drink alcohol,  or use illegal drugs. Some items may interact with your medicine. What should I watch for while using this medicine? Your condition will be monitored carefully while you are receiving this medicine. This medicine may increase the side effects of 5-fluorouracil, 5-FU. Tell your doctor or health care professional if you have diarrhea or mouth sores that do not get better or that get worse. What side effects may I notice from receiving this medicine? Side effects that you should report to your doctor or health care professional as soon as possible:  allergic reactions like skin rash, itching or hives, swelling of the face, lips, or tongue  breathing problems  fever, infection  mouth sores  unusual bleeding or bruising  unusually weak or tired Side effects that usually do not require medical attention (report to your doctor or health care professional if they continue or are bothersome):  constipation or diarrhea  loss of appetite  nausea, vomiting This list may not describe all possible side effects. Call your doctor for medical advice about side effects. You may report side effects to FDA at 1-800-FDA-1088. Where should I keep my medicine? This drug is given in a hospital or clinic and will not be stored at home. NOTE: This sheet is a summary. It may not cover all possible information. If you have questions about this medicine, talk to your doctor, pharmacist, or health care provider.  2021 Else[employer]/Gold Standard (2008-03-02 16:50:29) Oxaliplatin Injection What is this medicine? OXALIPLATIN (ox AL i PLA tin) is a chemotherapy drug. It targets fast dividing cells, like cancer cells, and causes these cells to die. This medicine is used to treat cancers of the colon and rectum, and many other cancers. This medicine may be used for other purposes; ask your health care provider or pharmacist if you have questions. COMMON BRAND NAME(S): Eloxatin What should I tell my health care provider  before I take this medicine? They need to know if you have any of these conditions:  heart disease  history of irregular heartbeat  liver disease  low blood counts, like white cells, platelets, or red blood cells  lung or breathing disease, like asthma  take medicines that treat or prevent blood clots  tingling of the fingers or toes, or other nerve disorder  an unusual or allergic reaction to oxaliplatin, other chemotherapy, other medicines, foods, dyes, or preservatives  pregnant or trying to get pregnant  breast-feeding How should I use this medicine? This drug is given as an infusion into a vein. It is administered in a hospital or clinic by a specially trained health care professional. Talk to your pediatrician regarding the use of this medicine in children. Special care may be needed. Overdosage: If you think you have taken too much of this medicine contact a poison control center or emergency room at once. NOTE: This medicine   is only for you. Do not share this medicine with others. What if I miss a dose? It is important not to miss a dose. Call your doctor or health care professional if you are unable to keep an appointment. What may interact with this medicine? Do not take this medicine with any of the following medications:  cisapride  dronedarone  pimozide  thioridazine This medicine may also interact with the following medications:  aspirin and aspirin-like medicines  certain medicines that treat or prevent blood clots like warfarin, apixaban, dabigatran, and rivaroxaban  cisplatin  cyclosporine  diuretics  medicines for infection like acyclovir, adefovir, amphotericin B, bacitracin, cidofovir, foscarnet, ganciclovir, gentamicin, pentamidine, vancomycin  NSAIDs, medicines for pain and inflammation, like ibuprofen or naproxen  other medicines that prolong the QT interval (an abnormal heart rhythm)  pamidronate  zoledronic acid This list may not  describe all possible interactions. Give your health care provider a list of all the medicines, herbs, non-prescription drugs, or dietary supplements you use. Also tell them if you smoke, drink alcohol, or use illegal drugs. Some items may interact with your medicine. What should I watch for while using this medicine? Your condition will be monitored carefully while you are receiving this medicine. You may need blood work done while you are taking this medicine. This medicine may make you feel generally unwell. This is not uncommon as chemotherapy can affect healthy cells as well as cancer cells. Report any side effects. Continue your course of treatment even though you feel ill unless your healthcare professional tells you to stop. This medicine can make you more sensitive to cold. Do not drink cold drinks or use ice. Cover exposed skin before coming in contact with cold temperatures or cold objects. When out in cold weather wear warm clothing and cover your mouth and nose to warm the air that goes into your lungs. Tell your doctor if you get sensitive to the cold. Do not become pregnant while taking this medicine or for 9 months after stopping it. Women should inform their health care professional if they wish to become pregnant or think they might be pregnant. Men should not father a child while taking this medicine and for 6 months after stopping it. There is potential for serious side effects to an unborn child. Talk to your health care professional for more information. Do not breast-feed a child while taking this medicine or for 3 months after stopping it. This medicine has caused ovarian failure in some women. This medicine may make it more difficult to get pregnant. Talk to your health care professional if you are concerned about your fertility. This medicine has caused decreased sperm counts in some men. This may make it more difficult to father a child. Talk to your health care professional if  you are concerned about your fertility. This medicine may increase your risk of getting an infection. Call your health care professional for advice if you get a fever, chills, or sore throat, or other symptoms of a cold or flu. Do not treat yourself. Try to avoid being around people who are sick. Avoid taking medicines that contain aspirin, acetaminophen, ibuprofen, naproxen, or ketoprofen unless instructed by your health care professional. These medicines may hide a fever. Be careful brushing or flossing your teeth or using a toothpick because you may get an infection or bleed more easily. If you have any dental work done, tell your dentist you are receiving this medicine. What side effects may I notice from receiving   this medicine? Side effects that you should report to your doctor or health care professional as soon as possible:  allergic reactions like skin rash, itching or hives, swelling of the face, lips, or tongue  breathing problems  cough  low blood counts - this medicine may decrease the number of white blood cells, red blood cells, and platelets. You may be at increased risk for infections and bleeding  nausea, vomiting  pain, redness, or irritation at site where injected  pain, tingling, numbness in the hands or feet  signs and symptoms of bleeding such as bloody or black, tarry stools; red or dark brown urine; spitting up blood or brown material that looks like coffee grounds; red spots on the skin; unusual bruising or bleeding from the eyes, gums, or nose  signs and symptoms of a dangerous change in heartbeat or heart rhythm like chest pain; dizziness; fast, irregular heartbeat; palpitations; feeling faint or lightheaded; falls  signs and symptoms of infection like fever; chills; cough; sore throat; pain or trouble passing urine  signs and symptoms of liver injury like dark yellow or brown urine; general ill feeling or flu-like symptoms; light-colored stools; loss of  appetite; nausea; right upper belly pain; unusually weak or tired; yellowing of the eyes or skin  signs and symptoms of low red blood cells or anemia such as unusually weak or tired; feeling faint or lightheaded; falls  signs and symptoms of muscle injury like dark urine; trouble passing urine or change in the amount of urine; unusually weak or tired; muscle pain; back pain Side effects that usually do not require medical attention (report to your doctor or health care professional if they continue or are bothersome):  changes in taste  diarrhea  gas  hair loss  loss of appetite  mouth sores This list may not describe all possible side effects. Call your doctor for medical advice about side effects. You may report side effects to FDA at 1-800-FDA-1088. Where should I keep my medicine? This drug is given in a hospital or clinic and will not be stored at home. NOTE: This sheet is a summary. It may not cover all possible information. If you have questions about this medicine, talk to your doctor, pharmacist, or health care provider.  2021 [employer] Standard (2019-01-14 12:20:35) Trastuzumab injection for infusion What is this medicine? TRASTUZUMAB (tras TOO zoo mab) is a monoclonal antibody. It is used to treat breast cancer and stomach cancer. This medicine may be used for other purposes; ask your health care provider or pharmacist if you have questions. COMMON BRAND NAME(S): Herceptin, Galvin Proffer, Trazimera What should I tell my health care provider before I take this medicine? They need to know if you have any of these conditions:  heart disease  heart failure  lung or breathing disease, like asthma  an unusual or allergic reaction to trastuzumab, benzyl alcohol, or other medications, foods, dyes, or preservatives  pregnant or trying to get pregnant  breast-feeding How should I use this medicine? This drug is given as an infusion into a vein.  It is administered in a hospital or clinic by a specially trained health care professional. Talk to your pediatrician regarding the use of this medicine in children. This medicine is not approved for use in children. Overdosage: If you think you have taken too much of this medicine contact a poison control center or emergency room at once. NOTE: This medicine is only for you. Do not share this medicine with others.  What if I miss a dose? It is important not to miss a dose. Call your doctor or health care professional if you are unable to keep an appointment. What may interact with this medicine? This medicine may interact with the following medications:  certain types of chemotherapy, such as daunorubicin, doxorubicin, epirubicin, and idarubicin This list may not describe all possible interactions. Give your health care provider a list of all the medicines, herbs, non-prescription drugs, or dietary supplements you use. Also tell them if you smoke, drink alcohol, or use illegal drugs. Some items may interact with your medicine. What should I watch for while using this medicine? Visit your doctor for checks on your progress. Report any side effects. Continue your course of treatment even though you feel ill unless your doctor tells you to stop. Call your doctor or health care professional for advice if you get a fever, chills or sore throat, or other symptoms of a cold or flu. Do not treat yourself. Try to avoid being around people who are sick. You may experience fever, chills and shaking during your first infusion. These effects are usually mild and can be treated with other medicines. Report any side effects during the infusion to your health care professional. Fever and chills usually do not happen with later infusions. Do not become pregnant while taking this medicine or for 7 months after stopping it. Women should inform their doctor if they wish to become pregnant or think they might be pregnant.  Women of child-bearing potential will need to have a negative pregnancy test before starting this medicine. There is a potential for serious side effects to an unborn child. Talk to your health care professional or pharmacist for more information. Do not breast-feed an infant while taking this medicine or for 7 months after stopping it. Women must use effective birth control with this medicine. What side effects may I notice from receiving this medicine? Side effects that you should report to your doctor or health care professional as soon as possible:  allergic reactions like skin rash, itching or hives, swelling of the face, lips, or tongue  chest pain or palpitations  cough  dizziness  feeling faint or lightheaded, falls  fever  general ill feeling or flu-like symptoms  signs of worsening heart failure like breathing problems; swelling in your legs and feet  unusually weak or tired Side effects that usually do not require medical attention (report to your doctor or health care professional if they continue or are bothersome):  bone pain  changes in taste  diarrhea  joint pain  nausea/vomiting  weight loss This list may not describe all possible side effects. Call your doctor for medical advice about side effects. You may report side effects to FDA at 1-800-FDA-1088. Where should I keep my medicine? This drug is given in a hospital or clinic and will not be stored at home. NOTE: This sheet is a summary. It may not cover all possible information. If you have questions about this medicine, talk to your doctor, pharmacist, or health care provider.  2021 Elsevier/Gold Standard (2016-08-21 14:37:52)

## 2020-12-08 LAB — T4: T4, Total: 7.5 ug/dL (ref 4.5–12.0)

## 2020-12-09 ENCOUNTER — Other Ambulatory Visit: Payer: Self-pay | Admitting: Hematology & Oncology

## 2020-12-09 ENCOUNTER — Other Ambulatory Visit: Payer: Self-pay

## 2020-12-09 ENCOUNTER — Inpatient Hospital Stay: Payer: Medicare Other | Attending: Hematology & Oncology

## 2020-12-09 VITALS — BP 109/66 | HR 79 | Temp 98.5°F | Resp 17

## 2020-12-09 DIAGNOSIS — C165 Malignant neoplasm of lesser curvature of stomach, unspecified: Secondary | ICD-10-CM | POA: Insufficient documentation

## 2020-12-09 DIAGNOSIS — C787 Secondary malignant neoplasm of liver and intrahepatic bile duct: Secondary | ICD-10-CM | POA: Diagnosis not present

## 2020-12-09 DIAGNOSIS — D500 Iron deficiency anemia secondary to blood loss (chronic): Secondary | ICD-10-CM | POA: Insufficient documentation

## 2020-12-09 DIAGNOSIS — Z5112 Encounter for antineoplastic immunotherapy: Secondary | ICD-10-CM | POA: Insufficient documentation

## 2020-12-09 DIAGNOSIS — Z79899 Other long term (current) drug therapy: Secondary | ICD-10-CM | POA: Insufficient documentation

## 2020-12-09 DIAGNOSIS — J3489 Other specified disorders of nose and nasal sinuses: Secondary | ICD-10-CM | POA: Insufficient documentation

## 2020-12-09 DIAGNOSIS — Z5111 Encounter for antineoplastic chemotherapy: Secondary | ICD-10-CM | POA: Diagnosis present

## 2020-12-09 DIAGNOSIS — K922 Gastrointestinal hemorrhage, unspecified: Secondary | ICD-10-CM | POA: Insufficient documentation

## 2020-12-09 DIAGNOSIS — C799 Secondary malignant neoplasm of unspecified site: Secondary | ICD-10-CM

## 2020-12-09 MED ORDER — SODIUM CHLORIDE 0.9% FLUSH
10.0000 mL | INTRAVENOUS | Status: DC | PRN
  Administered 2020-12-09: 10 mL
  Filled 2020-12-09: qty 10

## 2020-12-09 MED ORDER — HEPARIN SOD (PORK) LOCK FLUSH 100 UNIT/ML IV SOLN
500.0000 [IU] | Freq: Once | INTRAVENOUS | Status: AC | PRN
Start: 2020-12-09 — End: 2020-12-09
  Administered 2020-12-09: 500 [IU]
  Filled 2020-12-09: qty 5

## 2020-12-12 MED ORDER — GLIMEPIRIDE 2 MG PO TABS: 2.0000 mg | ORAL_TABLET | Freq: Every day | ORAL | 0 refills | Status: DC

## 2020-12-21 ENCOUNTER — Inpatient Hospital Stay: Payer: Medicare Other

## 2020-12-21 ENCOUNTER — Other Ambulatory Visit: Payer: Self-pay | Admitting: Hematology & Oncology

## 2020-12-21 ENCOUNTER — Other Ambulatory Visit: Payer: Self-pay

## 2020-12-21 ENCOUNTER — Inpatient Hospital Stay (HOSPITAL_BASED_OUTPATIENT_CLINIC_OR_DEPARTMENT_OTHER): Payer: Medicare Other | Admitting: Hematology & Oncology

## 2020-12-21 ENCOUNTER — Encounter: Payer: Self-pay | Admitting: Hematology & Oncology

## 2020-12-21 VITALS — BP 129/86 | HR 75 | Temp 99.6°F | Resp 18 | Wt 141.0 lb

## 2020-12-21 DIAGNOSIS — C169 Malignant neoplasm of stomach, unspecified: Secondary | ICD-10-CM

## 2020-12-21 DIAGNOSIS — C165 Malignant neoplasm of lesser curvature of stomach, unspecified: Secondary | ICD-10-CM | POA: Diagnosis not present

## 2020-12-21 DIAGNOSIS — Z5112 Encounter for antineoplastic immunotherapy: Secondary | ICD-10-CM | POA: Diagnosis not present

## 2020-12-21 DIAGNOSIS — D500 Iron deficiency anemia secondary to blood loss (chronic): Secondary | ICD-10-CM

## 2020-12-21 LAB — CMP (CANCER CENTER ONLY)
ALT: 20 U/L (ref 0–44)
AST: 20 U/L (ref 15–41)
Albumin: 3.7 g/dL (ref 3.5–5.0)
Alkaline Phosphatase: 68 U/L (ref 38–126)
Anion gap: 6 (ref 5–15)
BUN: 10 mg/dL (ref 8–23)
CO2: 28 mmol/L (ref 22–32)
Calcium: 9.1 mg/dL (ref 8.9–10.3)
Chloride: 106 mmol/L (ref 98–111)
Creatinine: 1.01 mg/dL (ref 0.61–1.24)
GFR, Estimated: >60 mL/min (ref >60)
Glucose, Bld: 139 mg/dL — ABNORMAL HIGH (ref 70–99)
Potassium: 3.9 mmol/L (ref 3.5–5.1)
Sodium: 140 mmol/L (ref 135–145)
Total Bilirubin: 0.6 mg/dL (ref 0.3–1.2)
Total Protein: 5.7 g/dL — ABNORMAL LOW (ref 6.5–8.1)

## 2020-12-21 LAB — CBC WITH DIFFERENTIAL (CANCER CENTER ONLY)
Abs Immature Granulocytes: 0.01 10*3/uL (ref 0.00–0.07)
Basophils Absolute: 0 10*3/uL (ref 0.0–0.1)
Basophils Relative: 1 %
Eosinophils Absolute: 0 10*3/uL (ref 0.0–0.5)
Eosinophils Relative: 2 %
HCT: 31.5 % — ABNORMAL LOW (ref 39.0–52.0)
Hemoglobin: 9.9 g/dL — ABNORMAL LOW (ref 13.0–17.0)
Immature Granulocytes: 0 %
Lymphocytes Relative: 35 %
Lymphs Abs: 0.8 10*3/uL (ref 0.7–4.0)
MCH: 28.2 pg (ref 26.0–34.0)
MCHC: 31.4 g/dL (ref 30.0–36.0)
MCV: 89.7 fL (ref 80.0–100.0)
Monocytes Absolute: 0.5 10*3/uL (ref 0.1–1.0)
Monocytes Relative: 19 %
Neutro Abs: 1 10*3/uL — ABNORMAL LOW (ref 1.7–7.7)
Neutrophils Relative %: 43 %
Platelet Count: 114 10*3/uL — ABNORMAL LOW (ref 150–400)
RBC: 3.51 MIL/uL — ABNORMAL LOW (ref 4.22–5.81)
RDW: 17.8 % — ABNORMAL HIGH (ref 11.5–15.5)
WBC Count: 2.4 10*3/uL — ABNORMAL LOW (ref 4.0–10.5)
nRBC: 0 % (ref 0.0–0.2)

## 2020-12-21 LAB — IRON AND TIBC
Iron: 76 ug/dL (ref 42–163)
Saturation Ratios: 27 % (ref 20–55)
TIBC: 281 ug/dL (ref 202–409)
UIBC: 205 ug/dL (ref 117–376)

## 2020-12-21 LAB — FERRITIN: Ferritin: 597 ng/mL — ABNORMAL HIGH (ref 24–336)

## 2020-12-21 MED ORDER — OXALIPLATIN CHEMO INJECTION 100 MG/20ML
85.0000 mg/m2 | Freq: Once | INTRAVENOUS | Status: AC
  Administered 2020-12-21: 150 mg via INTRAVENOUS
  Filled 2020-12-21: qty 20

## 2020-12-21 MED ORDER — DEXTROSE 5 % IV SOLN
Freq: Once | INTRAVENOUS | Status: AC
  Filled 2020-12-21: qty 250

## 2020-12-21 MED ORDER — DIPHENHYDRAMINE HCL 25 MG PO CAPS
ORAL_CAPSULE | ORAL | Status: AC
  Filled 2020-12-21: qty 2

## 2020-12-21 MED ORDER — GLIMEPIRIDE 2 MG PO TABS: 2.0000 mg | ORAL_TABLET | Freq: Two times a day (BID) | ORAL | 0 refills | Status: DC

## 2020-12-21 MED ORDER — SODIUM CHLORIDE 0.9 % IV SOLN
480.0000 mg | Freq: Once | INTRAVENOUS | Status: AC
  Administered 2020-12-21: 480 mg via INTRAVENOUS
  Filled 2020-12-21: qty 48

## 2020-12-21 MED ORDER — PALONOSETRON HCL INJECTION 0.25 MG/5ML
0.2500 mg | Freq: Once | INTRAVENOUS | Status: AC
  Administered 2020-12-21: 0.25 mg via INTRAVENOUS

## 2020-12-21 MED ORDER — SODIUM CHLORIDE 0.9 % IV SOLN
Freq: Once | INTRAVENOUS | Status: AC
  Filled 2020-12-21: qty 250

## 2020-12-21 MED ORDER — ACETAMINOPHEN 325 MG PO TABS
ORAL_TABLET | ORAL | Status: AC
  Filled 2020-12-21: qty 2

## 2020-12-21 MED ORDER — TRASTUZUMAB-DKST CHEMO 150 MG IV SOLR
4.0000 mg/kg | Freq: Once | INTRAVENOUS | Status: AC
  Administered 2020-12-21: 252 mg via INTRAVENOUS
  Filled 2020-12-21: qty 12

## 2020-12-21 MED ORDER — DIPHENHYDRAMINE HCL 25 MG PO CAPS
50.0000 mg | ORAL_CAPSULE | Freq: Once | ORAL | Status: AC
  Administered 2020-12-21: 50 mg via ORAL

## 2020-12-21 MED ORDER — LEUCOVORIN CALCIUM INJECTION 350 MG
400.0000 mg/m2 | Freq: Once | INTRAVENOUS | Status: AC
  Administered 2020-12-21: 700 mg via INTRAVENOUS
  Filled 2020-12-21: qty 35

## 2020-12-21 MED ORDER — FLUOROURACIL CHEMO INJECTION 2.5 GM/50ML
400.0000 mg/m2 | Freq: Once | INTRAVENOUS | Status: AC
  Administered 2020-12-21: 700 mg via INTRAVENOUS
  Filled 2020-12-21: qty 14

## 2020-12-21 MED ORDER — PALONOSETRON HCL INJECTION 0.25 MG/5ML
INTRAVENOUS | Status: AC
  Filled 2020-12-21: qty 5

## 2020-12-21 MED ORDER — ACETAMINOPHEN 325 MG PO TABS
650.0000 mg | ORAL_TABLET | Freq: Once | ORAL | Status: AC
  Administered 2020-12-21: 650 mg via ORAL

## 2020-12-21 MED ORDER — HEPARIN SOD (PORK) LOCK FLUSH 100 UNIT/ML IV SOLN
500.0000 [IU] | Freq: Once | INTRAVENOUS | Status: DC | PRN
  Filled 2020-12-21: qty 5

## 2020-12-21 MED ORDER — SODIUM CHLORIDE 0.9% FLUSH
10.0000 mL | INTRAVENOUS | Status: DC | PRN
  Filled 2020-12-21: qty 10

## 2020-12-21 MED ORDER — SODIUM CHLORIDE 0.9 % IV SOLN
10.0000 mg | Freq: Once | INTRAVENOUS | Status: AC
  Administered 2020-12-21: 10 mg via INTRAVENOUS
  Filled 2020-12-21: qty 10

## 2020-12-21 MED ORDER — SODIUM CHLORIDE 0.9 % IV SOLN
2160.0000 mg/m2 | INTRAVENOUS | Status: DC
  Administered 2020-12-21: 3800 mg via INTRAVENOUS
  Filled 2020-12-21: qty 76

## 2020-12-21 NOTE — Patient Instructions (Signed)
[facility] Discharge Instructions for Patients Receiving Chemotherapy  Today you received the following chemotherapy agents Ogivri, Oxaliplatin, 5FU, Leucovorin, Nivolumab  To help prevent nausea and vomiting after your treatment, we encourage you to take your nausea medication    If you develop nausea and vomiting that is not controlled by your nausea medication, call the clinic.   BELOW ARE SYMPTOMS THAT SHOULD BE REPORTED IMMEDIATELY:  *FEVER GREATER THAN 100.5 F  *CHILLS WITH OR WITHOUT FEVER  NAUSEA AND VOMITING THAT IS NOT CONTROLLED WITH YOUR NAUSEA MEDICATION  *UNUSUAL SHORTNESS OF BREATH  *UNUSUAL BRUISING OR BLEEDING  TENDERNESS IN MOUTH AND THROAT WITH OR WITHOUT PRESENCE OF ULCERS  *URINARY PROBLEMS  *BOWEL PROBLEMS  UNUSUAL RASH Items with * indicate a potential emergency and should be followed up as soon as possible.  Feel free to call the clinic should you have any questions or concerns. The clinic phone number is [phone].  Please show the [facility] at check-in to the Emergency Department and triage nurse.

## 2020-12-21 NOTE — Patient Instructions (Signed)
Implanted Port Insertion, Care After This sheet gives you information about how to care for yourself after your procedure. Your health care provider may also give you more specific instructions. If you have problems or questions, contact your health care provider. What can I expect after the procedure? After the procedure, it is common to have:  Discomfort at the port insertion site.  Bruising on the skin over the port. This should improve over 3-4 days. Follow these instructions at home: Port care  After your port is placed, you will get a manufacturer's information card. The card has information about your port. Keep this card with you at all times.  Take care of the port as told by your health care provider. Ask your health care provider if you or a family member can get training for taking care of the port at home. A home health care nurse may also take care of the port.  Make sure to remember what type of port you have. Incision care  Follow instructions from your health care provider about how to take care of your port insertion site. Make sure you: ? Wash your hands with soap and water before and after you change your bandage (dressing). If soap and water are not available, use hand sanitizer. ? Change your dressing as told by your health care provider. ? Leave stitches (sutures), skin glue, or adhesive strips in place. These skin closures may need to stay in place for 2 weeks or longer. If adhesive strip edges start to loosen and curl up, you may trim the loose edges. Do not remove adhesive strips completely unless your health care provider tells you to do that.  Check your port insertion site every day for signs of infection. Check for: ? Redness, swelling, or pain. ? Fluid or blood. ? Warmth. ? Pus or a bad smell.      Activity  Return to your normal activities as told by your health care provider. Ask your health care provider what activities are safe for you.  Do not  lift anything that is heavier than 10 lb (4.5 kg), or the limit that you are told, until your health care provider says that it is safe. General instructions  Take over-the-counter and prescription medicines only as told by your health care provider.  Do not take baths, swim, or use a hot tub until your health care provider approves. Ask your health care provider if you may take showers. You may only be allowed to take sponge baths.  Do not drive for 24 hours if you were given a sedative during your procedure.  Wear a medical alert bracelet in case of an emergency. This will tell any health care providers that you have a port.  Keep all follow-up visits as told by your health care provider. This is important. Contact a health care provider if:  You cannot flush your port with saline as directed, or you cannot draw blood from the port.  You have a fever or chills.  You have redness, swelling, or pain around your port insertion site.  You have fluid or blood coming from your port insertion site.  Your port insertion site feels warm to the touch.  You have pus or a bad smell coming from the port insertion site. Get help right away if:  You have chest pain or shortness of breath.  You have bleeding from your port that you cannot control. Summary  Take care of the port as told by your   health care provider. Keep the manufacturer's information card with you at all times.  Change your dressing as told by your health care provider.  Contact a health care provider if you have a fever or chills or if you have redness, swelling, or pain around your port insertion site.  Keep all follow-up visits as told by your health care provider. This information is not intended to replace advice given to you by your health care provider. Make sure you discuss any questions you have with your health care provider. Document Revised: 03/25/2018 Document Reviewed: 03/25/2018 Elsevier Patient Education   2021 Elsevier Inc.  

## 2020-12-21 NOTE — Progress Notes (Signed)
Hematology and Oncology Follow Up Visit  Jimmy Blankenship [id number] Feb 22, 1951 70 y.o. 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  He looks pretty good.  He is eating okay.  He is having no problems with bleeding.  On occasion, he may have some epistaxis.  This only is from the left nares.  He does have some rhinorrhea.  This seems to happen when he tries to eat.  He is not having any problems with dysphagia or odynophagia.  There is no bleeding.  He has had no abdominal pain.  He has had no problems with diarrhea.  There has been no urinary issues.  He has had no leg swelling.  Thankfully, his iron deficiency is not much of a problem.  When we last saw him, his ferritin was 555 with an iron saturation of 25%.  Overall, I would say his performance status is ECOG 1.    Medications:  Current Outpatient Medications:  .  atorvastatin (LIPITOR) 80 MG tablet, Take 1 tablet (80 mg total) by mouth daily., Disp: 90 tablet, Rfl: 3 .  clopidogrel (PLAVIX) 75 MG tablet, Take 75 mg by mouth daily., Disp: , Rfl:  .  Cyanocobalamin (VITAMIN B-12 PO), Take 1 tablet by mouth daily., Disp: , Rfl:  .  dexamethasone (DECADRON) 4 MG tablet, Take 2 tablets (8 mg total) by mouth daily. Start the day after chemotherapy for 2 days. Take with food., Disp: 30 tablet, Rfl: 1 .  feeding supplement (ENSURE ENLIVE / ENSURE PLUS) LIQD, Take 237 mLs by mouth 2 (two) times daily between meals., Disp: 237 mL, Rfl: 12 .  gabapentin (NEURONTIN) 300 MG capsule, Take 1 capsule (300 mg total) by mouth 2 (two) times daily., Disp: 60 capsule, Rfl: 5 .  glimepiride (AMARYL) 2 MG tablet, Take 1 tablet (2 mg  total) by mouth daily with breakfast., Disp: 30 tablet, Rfl: 0 .  lidocaine-prilocaine (EMLA) cream, Apply to affected area once, Disp: 30 g, Rfl: 3 .  metFORMIN (GLUCOPHAGE) 1000 MG tablet, Take 1,000 mg by mouth 2 (two) times daily with a meal., Disp: , Rfl:  .  metoprolol tartrate (LOPRESSOR) 50 MG tablet, TAKE ONE TABLET BY MOUTH TWICE A DAY, Disp: 180 tablet, Rfl: 3 .  ondansetron (ZOFRAN) 8 MG tablet, Take 1 tablet (8 mg total) by mouth 2 (two) times daily as needed for refractory nausea / vomiting. Start on day 3 after chemotherapy., Disp: 30 tablet, Rfl: 1 .  pantoprazole (PROTONIX) 40 MG tablet, Take 1 tablet (40 mg total) by mouth 2 (two) times daily., Disp: 60 tablet, Rfl: 1 .  prochlorperazine (COMPAZINE) 10 MG tablet, Take 1 tablet (10 mg total) by mouth every 6 (six) hours as needed (Nausea or vomiting)., Disp: 30 tablet, Rfl: 1  Allergies: No Known Allergies  Past Medical History, Surgical history, Social history, and Family History were reviewed and updated.  Review of Systems: Review of Systems  Constitutional: Negative.   HENT:  Negative.   Eyes: Negative.   Respiratory: Negative.   Cardiovascular: Negative.   Endocrine: Negative.   Genitourinary: Negative.    Musculoskeletal: Negative.   Skin: Negative.   Neurological: Negative.   Hematological: Negative.  Psychiatric/Behavioral: Negative.     Physical Exam:  weight is 141 lb (64 kg). His oral temperature is 99.6 F (37.6 C). His blood pressure is 129/86 and his pulse is 75. His respiration is 18 and oxygen saturation is 100%.   Wt Readings from Last 3 Encounters:  12/21/20 141 lb (64 kg)  12/07/20 145 lb (65.8 kg)  10/31/20 144 lb (65.3 kg)    Physical Exam Vitals reviewed.  HENT:     Head: Normocephalic and atraumatic.  Eyes:     Pupils: Pupils are equal, round, and reactive to light.  Cardiovascular:     Rate and Rhythm: Normal rate and regular rhythm.     Heart sounds: Normal heart sounds.   Pulmonary:     Effort: Pulmonary effort is normal.     Breath sounds: Normal breath sounds.  Abdominal:     General: Bowel sounds are normal.     Palpations: Abdomen is soft.  Musculoskeletal:        General: No tenderness or deformity. Normal range of motion.     Cervical back: Normal range of motion.  Lymphadenopathy:     Cervical: No cervical adenopathy.  Skin:    General: Skin is warm and dry.     Findings: No erythema or rash.  Neurological:     Mental Status: He is alert and oriented to person, place, and time.  Psychiatric:        Behavior: Behavior normal.        Thought Content: Thought content normal.        Judgment: Judgment normal.    Lab Results  Component Value Date   WBC 2.6 (L) 12/07/2020   HGB 9.4 (L) 12/07/2020   HCT 30.2 (L) 12/07/2020   MCV 87.8 12/07/2020   PLT 158 12/07/2020     Chemistry      Component Value Date/Time   NA 141 12/07/2020 0832   NA 140 06/10/2020 0935   K 3.4 (L) 12/07/2020 0832   CL 105 12/07/2020 0832   CO2 27 12/07/2020 0832   BUN 15 12/07/2020 0832   BUN 17 06/10/2020 0935   CREATININE 0.90 12/07/2020 0832      Component Value Date/Time   CALCIUM 8.8 (L) 12/07/2020 0832   ALKPHOS 71 12/07/2020 0832   AST 16 12/07/2020 0832   ALT 17 12/07/2020 0832   BILITOT 0.5 12/07/2020 0832      Impression and Plan: Jimmy Blankenship is a very nice 70 year old [name] male.  He has metastatic adenocarcinoma of the stomach.  He has had 6 cycles of chemotherapy along with immunotherapy and targeted therapy.    I we will go ahead and do his 7th cycle of treatment.  After the eighth cycle, we will plan for another scan.  I am glad that his quality of life is doing well.  He is active.  I think he is still working.  He still is planning on going to [place].  Possibly in August when I asked him today.  We will plan to get him back in 2 more weeks. Marland Kitchen  Volanda Napoleon, MD 4/13/20229:10 AM

## 2020-12-21 NOTE — Progress Notes (Signed)
Ok to treat with ANC of 1.0 per Dr Marin Olp

## 2020-12-21 NOTE — Progress Notes (Signed)
Pt.accompaniedby interpreter.

## 2020-12-22 LAB — T4: T4, Total: 8.4 ug/dL (ref 4.5–12.0)

## 2020-12-23 ENCOUNTER — Other Ambulatory Visit: Payer: Self-pay

## 2020-12-23 ENCOUNTER — Inpatient Hospital Stay: Payer: Medicare Other

## 2020-12-23 VITALS — BP 123/73 | HR 71 | Temp 98.5°F | Resp 18

## 2020-12-23 DIAGNOSIS — Z5112 Encounter for antineoplastic immunotherapy: Secondary | ICD-10-CM | POA: Diagnosis not present

## 2020-12-23 DIAGNOSIS — C165 Malignant neoplasm of lesser curvature of stomach, unspecified: Secondary | ICD-10-CM

## 2020-12-23 DIAGNOSIS — C799 Secondary malignant neoplasm of unspecified site: Secondary | ICD-10-CM

## 2020-12-23 MED ORDER — SODIUM CHLORIDE 0.9% FLUSH
10.0000 mL | INTRAVENOUS | Status: DC | PRN
  Administered 2020-12-23: 10 mL
  Filled 2020-12-23: qty 10

## 2020-12-23 MED ORDER — HEPARIN SOD (PORK) LOCK FLUSH 100 UNIT/ML IV SOLN
500.0000 [IU] | Freq: Once | INTRAVENOUS | Status: AC | PRN
  Administered 2020-12-23: 500 [IU]
  Filled 2020-12-23: qty 5

## 2021-01-04 ENCOUNTER — Encounter: Payer: Self-pay | Admitting: Hematology & Oncology

## 2021-01-04 ENCOUNTER — Telehealth: Payer: Self-pay

## 2021-01-04 ENCOUNTER — Inpatient Hospital Stay: Payer: Medicare Other

## 2021-01-04 ENCOUNTER — Inpatient Hospital Stay (HOSPITAL_BASED_OUTPATIENT_CLINIC_OR_DEPARTMENT_OTHER): Payer: Medicare Other | Admitting: Hematology & Oncology

## 2021-01-04 ENCOUNTER — Other Ambulatory Visit: Payer: Self-pay

## 2021-01-04 VITALS — BP 126/74 | HR 78 | Temp 99.0°F | Resp 18 | Wt 141.0 lb

## 2021-01-04 DIAGNOSIS — C169 Malignant neoplasm of stomach, unspecified: Secondary | ICD-10-CM

## 2021-01-04 DIAGNOSIS — C165 Malignant neoplasm of lesser curvature of stomach, unspecified: Secondary | ICD-10-CM

## 2021-01-04 DIAGNOSIS — Z5112 Encounter for antineoplastic immunotherapy: Secondary | ICD-10-CM | POA: Diagnosis not present

## 2021-01-04 DIAGNOSIS — C799 Secondary malignant neoplasm of unspecified site: Secondary | ICD-10-CM

## 2021-01-04 LAB — CBC WITH DIFFERENTIAL (CANCER CENTER ONLY)
Abs Immature Granulocytes: 0.01 10*3/uL (ref 0.00–0.07)
Basophils Absolute: 0 10*3/uL (ref 0.0–0.1)
Basophils Relative: 1 %
Eosinophils Absolute: 0.1 10*3/uL (ref 0.0–0.5)
Eosinophils Relative: 3 %
HCT: 31.9 % — ABNORMAL LOW (ref 39.0–52.0)
Hemoglobin: 10.1 g/dL — ABNORMAL LOW (ref 13.0–17.0)
Immature Granulocytes: 1 %
Lymphocytes Relative: 34 %
Lymphs Abs: 0.7 10*3/uL (ref 0.7–4.0)
MCH: 28.5 pg (ref 26.0–34.0)
MCHC: 31.7 g/dL (ref 30.0–36.0)
MCV: 89.9 fL (ref 80.0–100.0)
Monocytes Absolute: 0.6 10*3/uL (ref 0.1–1.0)
Monocytes Relative: 27 %
Neutro Abs: 0.8 10*3/uL — ABNORMAL LOW (ref 1.7–7.7)
Neutrophils Relative %: 34 %
Platelet Count: 80 10*3/uL — ABNORMAL LOW (ref 150–400)
RBC: 3.55 MIL/uL — ABNORMAL LOW (ref 4.22–5.81)
RDW: 17.1 % — ABNORMAL HIGH (ref 11.5–15.5)
WBC Count: 2.2 10*3/uL — ABNORMAL LOW (ref 4.0–10.5)
nRBC: 0 % (ref 0.0–0.2)

## 2021-01-04 LAB — CMP (CANCER CENTER ONLY)
ALT: 18 U/L (ref 0–44)
AST: 19 U/L (ref 15–41)
Albumin: 3.7 g/dL (ref 3.5–5.0)
Alkaline Phosphatase: 67 U/L (ref 38–126)
Anion gap: 9 (ref 5–15)
BUN: 17 mg/dL (ref 8–23)
CO2: 27 mmol/L (ref 22–32)
Calcium: 9.2 mg/dL (ref 8.9–10.3)
Chloride: 105 mmol/L (ref 98–111)
Creatinine: 0.98 mg/dL (ref 0.61–1.24)
GFR, Estimated: >60 mL/min (ref >60)
Glucose, Bld: 130 mg/dL — ABNORMAL HIGH (ref 70–99)
Potassium: 3.4 mmol/L — ABNORMAL LOW (ref 3.5–5.1)
Sodium: 141 mmol/L (ref 135–145)
Total Bilirubin: 0.4 mg/dL (ref 0.3–1.2)
Total Protein: 6 g/dL — ABNORMAL LOW (ref 6.5–8.1)

## 2021-01-04 LAB — IRON AND TIBC
Iron: 56 ug/dL (ref 42–163)
Saturation Ratios: 19 % — ABNORMAL LOW (ref 20–55)
TIBC: 289 ug/dL (ref 202–409)
UIBC: 233 ug/dL (ref 117–376)

## 2021-01-04 LAB — FERRITIN: Ferritin: 425 ng/mL — ABNORMAL HIGH (ref 24–336)

## 2021-01-04 LAB — TSH: TSH: 0.516 u[IU]/mL (ref 0.320–4.118)

## 2021-01-04 MED ORDER — TRASTUZUMAB-DKST CHEMO 150 MG IV SOLR
4.0000 mg/kg | Freq: Once | INTRAVENOUS | Status: AC
  Administered 2021-01-04: 252 mg via INTRAVENOUS
  Filled 2021-01-04: qty 12

## 2021-01-04 MED ORDER — DIPHENHYDRAMINE HCL 25 MG PO CAPS
50.0000 mg | ORAL_CAPSULE | Freq: Once | ORAL | Status: AC
Start: 2021-01-04 — End: 2021-01-04
  Administered 2021-01-04: 50 mg via ORAL

## 2021-01-04 MED ORDER — LEUCOVORIN CALCIUM INJECTION 350 MG
400.0000 mg/m2 | Freq: Once | INTRAVENOUS | Status: AC
  Administered 2021-01-04: 700 mg via INTRAVENOUS
  Filled 2021-01-04: qty 35

## 2021-01-04 MED ORDER — FLUOROURACIL CHEMO INJECTION 2.5 GM/50ML
400.0000 mg/m2 | Freq: Once | INTRAVENOUS | Status: AC
  Administered 2021-01-04: 700 mg via INTRAVENOUS
  Filled 2021-01-04: qty 14

## 2021-01-04 MED ORDER — DIPHENHYDRAMINE HCL 25 MG PO CAPS
ORAL_CAPSULE | ORAL | Status: AC
  Filled 2021-01-04: qty 2

## 2021-01-04 MED ORDER — SODIUM CHLORIDE 0.9 % IV SOLN
Freq: Once | INTRAVENOUS | Status: AC
  Filled 2021-01-04: qty 250

## 2021-01-04 MED ORDER — PALONOSETRON HCL INJECTION 0.25 MG/5ML
0.2500 mg | Freq: Once | INTRAVENOUS | Status: AC
  Administered 2021-01-04: 0.25 mg via INTRAVENOUS

## 2021-01-04 MED ORDER — DEXTROSE 5 % IV SOLN
Freq: Once | INTRAVENOUS | Status: AC
  Filled 2021-01-04: qty 250

## 2021-01-04 MED ORDER — DEXTROSE 5 % IV SOLN
Freq: Once | INTRAVENOUS | Status: AC
Start: 2021-01-04 — End: 2021-01-04
  Filled 2021-01-04: qty 250

## 2021-01-04 MED ORDER — ACETAMINOPHEN 325 MG PO TABS
ORAL_TABLET | ORAL | Status: AC
  Filled 2021-01-04: qty 2

## 2021-01-04 MED ORDER — HEPARIN SOD (PORK) LOCK FLUSH 100 UNIT/ML IV SOLN
500.0000 [IU] | Freq: Once | INTRAVENOUS | Status: DC | PRN
  Filled 2021-01-04: qty 5

## 2021-01-04 MED ORDER — SODIUM CHLORIDE 0.9 % IV SOLN
10.0000 mg | Freq: Once | INTRAVENOUS | Status: AC
  Administered 2021-01-04: 10 mg via INTRAVENOUS
  Filled 2021-01-04: qty 10

## 2021-01-04 MED ORDER — PALONOSETRON HCL INJECTION 0.25 MG/5ML
INTRAVENOUS | Status: AC
  Filled 2021-01-04: qty 5

## 2021-01-04 MED ORDER — ACETAMINOPHEN 325 MG PO TABS
650.0000 mg | ORAL_TABLET | Freq: Once | ORAL | Status: AC
Start: 2021-01-04 — End: 2021-01-04
  Administered 2021-01-04: 650 mg via ORAL

## 2021-01-04 MED ORDER — SODIUM CHLORIDE 0.9 % IV SOLN
2160.0000 mg/m2 | INTRAVENOUS | Status: DC
  Administered 2021-01-04: 3800 mg via INTRAVENOUS
  Filled 2021-01-04: qty 76

## 2021-01-04 MED ORDER — SODIUM CHLORIDE 0.9% FLUSH
10.0000 mL | INTRAVENOUS | Status: DC | PRN
  Filled 2021-01-04: qty 10

## 2021-01-04 MED ORDER — DEXTROSE 5 % IV SOLN
85.0000 mg/m2 | Freq: Once | INTRAVENOUS | Status: AC
  Administered 2021-01-04: 150 mg via INTRAVENOUS
  Filled 2021-01-04: qty 20

## 2021-01-04 NOTE — Progress Notes (Signed)
Hematology and Oncology Follow Up Visit  Jimmy Blankenship [id number] 1951/02/05 70 y.o. 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  He looks quite good.  He really has had no issues with respect to pain.  He is eating a little bit better.  He is having no nausea or vomiting.  He has had no bleeding.  There is no change in bowel or bladder habits.  He is not having any issues with epistaxis.  He has had no problems with fever.  He has had no leg swelling.  He has had no rashes.  He has had no issues with headache.  There is no mouth sores.  Overall, his performance status is ECOG 1.     Medications:  Current Outpatient Medications:  .  canagliflozin (INVOKANA) 100 MG TABS tablet, Take 100 mg by mouth daily., Disp: , Rfl:  .  cyclobenzaprine (FEXMID) 7.5 MG tablet, Take 7.5 mg by mouth at bedtime., Disp: , Rfl:  .  atorvastatin (LIPITOR) 80 MG tablet, Take 1 tablet (80 mg total) by mouth daily., Disp: 90 tablet, Rfl: 3 .  clopidogrel (PLAVIX) 75 MG tablet, Take 75 mg by mouth daily., Disp: , Rfl:  .  Cyanocobalamin (VITAMIN B-12 PO), Take 1 tablet by mouth daily., Disp: , Rfl:  .  CYANOCOBALAMIN SL, Take by mouth., Disp: , Rfl:  .  dexamethasone (DECADRON) 4 MG tablet, Take 2 tablets (8 mg total) by mouth daily. Start the day after chemotherapy for 2 days. Take with food., Disp: 30 tablet, Rfl: 1 .  feeding supplement (ENSURE ENLIVE / ENSURE PLUS) LIQD, Take 237 mLs by mouth 2 (two) times daily between meals., Disp: 237 mL, Rfl: 12 .  gabapentin (NEURONTIN) 300 MG capsule, Take 1 capsule (300 mg total) by mouth 2 (two) times daily., Disp: 60 capsule, Rfl: 5 .   glimepiride (AMARYL) 2 MG tablet, Take 1 tablet (2 mg total) by mouth 2 (two) times daily., Disp: 90 tablet, Rfl: 0 .  lidocaine-prilocaine (EMLA) cream, Apply to affected area once, Disp: 30 g, Rfl: 3 .  metFORMIN (GLUCOPHAGE) 1000 MG tablet, Take 1,000 mg by mouth 2 (two) times daily with a meal., Disp: , Rfl:  .  metoprolol tartrate (LOPRESSOR) 50 MG tablet, TAKE ONE TABLET BY MOUTH TWICE A DAY, Disp: 180 tablet, Rfl: 3 .  ondansetron (ZOFRAN) 8 MG tablet, Take 1 tablet (8 mg total) by mouth 2 (two) times daily as needed for refractory nausea / vomiting. Start on day 3 after chemotherapy., Disp: 30 tablet, Rfl: 1 .  pantoprazole (PROTONIX) 40 MG tablet, Take 1 tablet (40 mg total) by mouth 2 (two) times daily., Disp: 60 tablet, Rfl: 1 .  prochlorperazine (COMPAZINE) 10 MG tablet, Take 1 tablet (10 mg total) by mouth every 6 (six) hours as needed (Nausea or vomiting)., Disp: 30 tablet, Rfl: 1  Allergies: No Known Allergies  Past Medical History, Surgical history, Social history, and Family History were reviewed and updated.  Review of Systems: Review of Systems  Constitutional: Negative.   HENT:  Negative.   Eyes: Negative.   Respiratory: Negative.   Cardiovascular: Negative.   Endocrine: Negative.   Genitourinary: Negative.  Musculoskeletal: Negative.   Skin: Negative.   Neurological: Negative.   Hematological: Negative.   Psychiatric/Behavioral: Negative.     Physical Exam:  weight is 141 lb (64 kg). His oral temperature is 99 F (37.2 C). His blood pressure is 126/74 and his pulse is 78. His respiration is 18 and oxygen saturation is 100%.   Wt Readings from Last 3 Encounters:  01/04/21 141 lb (64 kg)  12/21/20 141 lb (64 kg)  12/07/20 145 lb (65.8 kg)    Physical Exam Vitals reviewed.  HENT:     Head: Normocephalic and atraumatic.  Eyes:     Pupils: Pupils are equal, round, and reactive to light.  Cardiovascular:     Rate and Rhythm: Normal rate and regular  rhythm.     Heart sounds: Normal heart sounds.  Pulmonary:     Effort: Pulmonary effort is normal.     Breath sounds: Normal breath sounds.  Abdominal:     General: Bowel sounds are normal.     Palpations: Abdomen is soft.  Musculoskeletal:        General: No tenderness or deformity. Normal range of motion.     Cervical back: Normal range of motion.  Lymphadenopathy:     Cervical: No cervical adenopathy.  Skin:    General: Skin is warm and dry.     Findings: No erythema or rash.  Neurological:     Mental Status: He is alert and oriented to person, place, and time.  Psychiatric:        Behavior: Behavior normal.        Thought Content: Thought content normal.        Judgment: Judgment normal.    Lab Results  Component Value Date   WBC 2.2 (L) 01/04/2021   HGB 10.1 (L) 01/04/2021   HCT 31.9 (L) 01/04/2021   MCV 89.9 01/04/2021   PLT 80 (L) 01/04/2021     Chemistry      Component Value Date/Time   NA 141 01/04/2021 0849   NA 140 06/10/2020 0935   K 3.4 (L) 01/04/2021 0849   CL 105 01/04/2021 0849   CO2 27 01/04/2021 0849   BUN 17 01/04/2021 0849   BUN 17 06/10/2020 0935   CREATININE 0.98 01/04/2021 0849      Component Value Date/Time   CALCIUM 9.2 01/04/2021 0849   ALKPHOS 67 01/04/2021 0849   AST 19 01/04/2021 0849   ALT 18 01/04/2021 0849   BILITOT 0.4 01/04/2021 0849      Impression and Plan: Jimmy Blankenship is a very nice 70 year old [name] male.  He has metastatic adenocarcinoma of the stomach.  He has had 7 cycles of chemotherapy along with immunotherapy and targeted therapy.    I we will go ahead and do his 8th cycle of treatment.  After this cycle, we will plan for another scan.  Depending on what we find with the scans, we might want to consider adjusting his therapy so that he would be on more of a maintenance type of therapy.  I am glad that his quality of life is doing well.  He is active.  I think he is still working.  He still is planning on going to  [place].  Possibly in August when I asked him today.  We will plan to get him back in 3 weeks. Marland Kitchen  Jimmy Napoleon, MD 4/27/20229:51 AM

## 2021-01-04 NOTE — Progress Notes (Signed)
Ok to treat with platelets of 80 & ANC of 0.8 per Dr Marin Olp. dph

## 2021-01-04 NOTE — Addendum Note (Signed)
Addended by: Burney Gauze R on: 01/04/2021 10:35 AM   Modules accepted: Orders

## 2021-01-04 NOTE — Patient Instructions (Signed)
Fluorouracil, 5-FU injection What is this medicine? FLUOROURACIL, 5-FU (flure oh YOOR a sil) is a chemotherapy drug. It slows the growth of cancer cells. This medicine is used to treat many types of cancer like breast cancer, colon or rectal cancer, pancreatic cancer, and stomach cancer. This medicine may be used for other purposes; ask your health care provider or pharmacist if you have questions. COMMON BRAND NAME(S): Adrucil What should I tell my health care provider before I take this medicine? They need to know if you have any of these conditions:  blood disorders  dihydropyrimidine dehydrogenase (DPD) deficiency  infection (especially a virus infection such as chickenpox, cold sores, or herpes)  kidney disease  liver disease  malnourished, poor nutrition  recent or ongoing radiation therapy  an unusual or allergic reaction to fluorouracil, other chemotherapy, other medicines, foods, dyes, or preservatives  pregnant or trying to get pregnant  breast-feeding How should I use this medicine? This drug is given as an infusion or injection into a vein. It is administered in a hospital or clinic by a specially trained health care professional. Talk to your pediatrician regarding the use of this medicine in children. Special care may be needed. Overdosage: If you think you have taken too much of this medicine contact a poison control center or emergency room at once. NOTE: This medicine is only for you. Do not share this medicine with others. What if I miss a dose? It is important not to miss your dose. Call your doctor or health care professional if you are unable to keep an appointment. What may interact with this medicine? Do not take this medicine with any of the following medications:  live virus vaccines This medicine may also interact with the following medications:  medicines that treat or prevent blood clots like warfarin, enoxaparin, and dalteparin This list may not  describe all possible interactions. Give your health care provider a list of all the medicines, herbs, non-prescription drugs, or dietary supplements you use. Also tell them if you smoke, drink alcohol, or use illegal drugs. Some items may interact with your medicine. What should I watch for while using this medicine? Visit your doctor for checks on your progress. This drug may make you feel generally unwell. This is not uncommon, as chemotherapy can affect healthy cells as well as cancer cells. Report any side effects. Continue your course of treatment even though you feel ill unless your doctor tells you to stop. In some cases, you may be given additional medicines to help with side effects. Follow all directions for their use. Call your doctor or health care professional for advice if you get a fever, chills or sore throat, or other symptoms of a cold or flu. Do not treat yourself. This drug decreases your body's ability to fight infections. Try to avoid being around people who are sick. This medicine may increase your risk to bruise or bleed. Call your doctor or health care professional if you notice any unusual bleeding. Be careful brushing and flossing your teeth or using a toothpick because you may get an infection or bleed more easily. If you have any dental work done, tell your dentist you are receiving this medicine. Avoid taking products that contain aspirin, acetaminophen, ibuprofen, naproxen, or ketoprofen unless instructed by your doctor. These medicines may hide a fever. Do not become pregnant while taking this medicine. Women should inform their doctor if they wish to become pregnant or think they might be pregnant. There is a potential   for serious side effects to an unborn child. Talk to your health care professional or pharmacist for more information. Do not breast-feed an infant while taking this medicine. Men should inform their doctor if they wish to father a child. This medicine may  lower sperm counts. Do not treat diarrhea with over the counter products. Contact your doctor if you have diarrhea that lasts more than 2 days or if it is severe and watery. This medicine can make you more sensitive to the sun. Keep out of the sun. If you cannot avoid being in the sun, wear protective clothing and use sunscreen. Do not use sun lamps or tanning beds/booths. What side effects may I notice from receiving this medicine? Side effects that you should report to your doctor or health care professional as soon as possible:  allergic reactions like skin rash, itching or hives, swelling of the face, lips, or tongue  low blood counts - this medicine may decrease the number of white blood cells, red blood cells and platelets. You may be at increased risk for infections and bleeding.  signs of infection - fever or chills, cough, sore throat, pain or difficulty passing urine  signs of decreased platelets or bleeding - bruising, pinpoint red spots on the skin, black, tarry stools, blood in the urine  signs of decreased red blood cells - unusually weak or tired, fainting spells, lightheadedness  breathing problems  changes in vision  chest pain  mouth sores  nausea and vomiting  pain, swelling, redness at site where injected  pain, tingling, numbness in the hands or feet  redness, swelling, or sores on hands or feet  stomach pain  unusual bleeding Side effects that usually do not require medical attention (report to your doctor or health care professional if they continue or are bothersome):  changes in finger or toe nails  diarrhea  dry or itchy skin  hair loss  headache  loss of appetite  sensitivity of eyes to the light  stomach upset  unusually teary eyes This list may not describe all possible side effects. Call your doctor for medical advice about side effects. You may report side effects to FDA at 1-800-FDA-1088. Where should I keep my medicine? This  drug is given in a hospital or clinic and will not be stored at home. NOTE: This sheet is a summary. It may not cover all possible information. If you have questions about this medicine, talk to your doctor, pharmacist, or health care provider.  2021 [employer] Standard (2019-07-28 15:00:03) Leucovorin injection What is this medicine? LEUCOVORIN (loo koe VOR in) is used to prevent or treat the harmful effects of some medicines. This medicine is used to treat anemia caused by a low amount of folic acid in the body. It is also used with 5-fluorouracil (5-FU) to treat colon cancer. This medicine may be used for other purposes; ask your health care provider or pharmacist if you have questions. What should I tell my health care provider before I take this medicine? They need to know if you have any of these conditions:  anemia from low levels of vitamin B-12 in the blood  an unusual or allergic reaction to leucovorin, folic acid, other medicines, foods, dyes, or preservatives  pregnant or trying to get pregnant  breast-feeding How should I use this medicine? This medicine is for injection into a muscle or into a vein. It is given by a health care professional in a hospital or clinic setting. Talk to your pediatrician   regarding the use of this medicine in children. Special care may be needed. Overdosage: If you think you have taken too much of this medicine contact a poison control center or emergency room at once. NOTE: This medicine is only for you. Do not share this medicine with others. What if I miss a dose? This does not apply. What may interact with this medicine?  capecitabine  fluorouracil  phenobarbital  phenytoin  primidone  trimethoprim-sulfamethoxazole This list may not describe all possible interactions. Give your health care provider a list of all the medicines, herbs, non-prescription drugs, or dietary supplements you use. Also tell them if you smoke, drink alcohol,  or use illegal drugs. Some items may interact with your medicine. What should I watch for while using this medicine? Your condition will be monitored carefully while you are receiving this medicine. This medicine may increase the side effects of 5-fluorouracil, 5-FU. Tell your doctor or health care professional if you have diarrhea or mouth sores that do not get better or that get worse. What side effects may I notice from receiving this medicine? Side effects that you should report to your doctor or health care professional as soon as possible:  allergic reactions like skin rash, itching or hives, swelling of the face, lips, or tongue  breathing problems  fever, infection  mouth sores  unusual bleeding or bruising  unusually weak or tired Side effects that usually do not require medical attention (report to your doctor or health care professional if they continue or are bothersome):  constipation or diarrhea  loss of appetite  nausea, vomiting This list may not describe all possible side effects. Call your doctor for medical advice about side effects. You may report side effects to FDA at 1-800-FDA-1088. Where should I keep my medicine? This drug is given in a hospital or clinic and will not be stored at home. NOTE: This sheet is a summary. It may not cover all possible information. If you have questions about this medicine, talk to your doctor, pharmacist, or health care provider.  2021 [employer]/Gold Standard (2008-03-02 16:50:29) Oxaliplatin Injection What is this medicine? OXALIPLATIN (ox AL i PLA tin) is a chemotherapy drug. It targets fast dividing cells, like cancer cells, and causes these cells to die. This medicine is used to treat cancers of the colon and rectum, and many other cancers. This medicine may be used for other purposes; ask your health care provider or pharmacist if you have questions. COMMON BRAND NAME(S): Eloxatin What should I tell my health care provider  before I take this medicine? They need to know if you have any of these conditions:  heart disease  history of irregular heartbeat  liver disease  low blood counts, like white cells, platelets, or red blood cells  lung or breathing disease, like asthma  take medicines that treat or prevent blood clots  tingling of the fingers or toes, or other nerve disorder  an unusual or allergic reaction to oxaliplatin, other chemotherapy, other medicines, foods, dyes, or preservatives  pregnant or trying to get pregnant  breast-feeding How should I use this medicine? This drug is given as an infusion into a vein. It is administered in a hospital or clinic by a specially trained health care professional. Talk to your pediatrician regarding the use of this medicine in children. Special care may be needed. Overdosage: If you think you have taken too much of this medicine contact a poison control center or emergency room at once. NOTE: This medicine   is only for you. Do not share this medicine with others. What if I miss a dose? It is important not to miss a dose. Call your doctor or health care professional if you are unable to keep an appointment. What may interact with this medicine? Do not take this medicine with any of the following medications:  cisapride  dronedarone  pimozide  thioridazine This medicine may also interact with the following medications:  aspirin and aspirin-like medicines  certain medicines that treat or prevent blood clots like warfarin, apixaban, dabigatran, and rivaroxaban  cisplatin  cyclosporine  diuretics  medicines for infection like acyclovir, adefovir, amphotericin B, bacitracin, cidofovir, foscarnet, ganciclovir, gentamicin, pentamidine, vancomycin  NSAIDs, medicines for pain and inflammation, like ibuprofen or naproxen  other medicines that prolong the QT interval (an abnormal heart rhythm)  pamidronate  zoledronic acid This list may not  describe all possible interactions. Give your health care provider a list of all the medicines, herbs, non-prescription drugs, or dietary supplements you use. Also tell them if you smoke, drink alcohol, or use illegal drugs. Some items may interact with your medicine. What should I watch for while using this medicine? Your condition will be monitored carefully while you are receiving this medicine. You may need blood work done while you are taking this medicine. This medicine may make you feel generally unwell. This is not uncommon as chemotherapy can affect healthy cells as well as cancer cells. Report any side effects. Continue your course of treatment even though you feel ill unless your healthcare professional tells you to stop. This medicine can make you more sensitive to cold. Do not drink cold drinks or use ice. Cover exposed skin before coming in contact with cold temperatures or cold objects. When out in cold weather wear warm clothing and cover your mouth and nose to warm the air that goes into your lungs. Tell your doctor if you get sensitive to the cold. Do not become pregnant while taking this medicine or for 9 months after stopping it. Women should inform their health care professional if they wish to become pregnant or think they might be pregnant. Men should not father a child while taking this medicine and for 6 months after stopping it. There is potential for serious side effects to an unborn child. Talk to your health care professional for more information. Do not breast-feed a child while taking this medicine or for 3 months after stopping it. This medicine has caused ovarian failure in some women. This medicine may make it more difficult to get pregnant. Talk to your health care professional if you are concerned about your fertility. This medicine has caused decreased sperm counts in some men. This may make it more difficult to father a child. Talk to your health care professional if  you are concerned about your fertility. This medicine may increase your risk of getting an infection. Call your health care professional for advice if you get a fever, chills, or sore throat, or other symptoms of a cold or flu. Do not treat yourself. Try to avoid being around people who are sick. Avoid taking medicines that contain aspirin, acetaminophen, ibuprofen, naproxen, or ketoprofen unless instructed by your health care professional. These medicines may hide a fever. Be careful brushing or flossing your teeth or using a toothpick because you may get an infection or bleed more easily. If you have any dental work done, tell your dentist you are receiving this medicine. What side effects may I notice from receiving   this medicine? Side effects that you should report to your doctor or health care professional as soon as possible:  allergic reactions like skin rash, itching or hives, swelling of the face, lips, or tongue  breathing problems  cough  low blood counts - this medicine may decrease the number of white blood cells, red blood cells, and platelets. You may be at increased risk for infections and bleeding  nausea, vomiting  pain, redness, or irritation at site where injected  pain, tingling, numbness in the hands or feet  signs and symptoms of bleeding such as bloody or black, tarry stools; red or dark brown urine; spitting up blood or brown material that looks like coffee grounds; red spots on the skin; unusual bruising or bleeding from the eyes, gums, or nose  signs and symptoms of a dangerous change in heartbeat or heart rhythm like chest pain; dizziness; fast, irregular heartbeat; palpitations; feeling faint or lightheaded; falls  signs and symptoms of infection like fever; chills; cough; sore throat; pain or trouble passing urine  signs and symptoms of liver injury like dark yellow or brown urine; general ill feeling or flu-like symptoms; light-colored stools; loss of  appetite; nausea; right upper belly pain; unusually weak or tired; yellowing of the eyes or skin  signs and symptoms of low red blood cells or anemia such as unusually weak or tired; feeling faint or lightheaded; falls  signs and symptoms of muscle injury like dark urine; trouble passing urine or change in the amount of urine; unusually weak or tired; muscle pain; back pain Side effects that usually do not require medical attention (report to your doctor or health care professional if they continue or are bothersome):  changes in taste  diarrhea  gas  hair loss  loss of appetite  mouth sores This list may not describe all possible side effects. Call your doctor for medical advice about side effects. You may report side effects to FDA at 1-800-FDA-1088. Where should I keep my medicine? This drug is given in a hospital or clinic and will not be stored at home. NOTE: This sheet is a summary. It may not cover all possible information. If you have questions about this medicine, talk to your doctor, pharmacist, or health care provider.  2021 [employer]/Gold Standard (2019-01-14 12:20:35)  

## 2021-01-04 NOTE — Patient Instructions (Signed)
Implanted Port Insertion, Care After This sheet gives you information about how to care for yourself after your procedure. Your health care provider may also give you more specific instructions. If you have problems or questions, contact your health care provider. What can I expect after the procedure? After the procedure, it is common to have:  Discomfort at the port insertion site.  Bruising on the skin over the port. This should improve over 3-4 days. Follow these instructions at home: Port care  After your port is placed, you will get a manufacturer's information card. The card has information about your port. Keep this card with you at all times.  Take care of the port as told by your health care provider. Ask your health care provider if you or a family member can get training for taking care of the port at home. A home health care nurse may also take care of the port.  Make sure to remember what type of port you have. Incision care  Follow instructions from your health care provider about how to take care of your port insertion site. Make sure you: ? Wash your hands with soap and water before and after you change your bandage (dressing). If soap and water are not available, use hand sanitizer. ? Change your dressing as told by your health care provider. ? Leave stitches (sutures), skin glue, or adhesive strips in place. These skin closures may need to stay in place for 2 weeks or longer. If adhesive strip edges start to loosen and curl up, you may trim the loose edges. Do not remove adhesive strips completely unless your health care provider tells you to do that.  Check your port insertion site every day for signs of infection. Check for: ? Redness, swelling, or pain. ? Fluid or blood. ? Warmth. ? Pus or a bad smell.      Activity  Return to your normal activities as told by your health care provider. Ask your health care provider what activities are safe for you.  Do not  lift anything that is heavier than 10 lb (4.5 kg), or the limit that you are told, until your health care provider says that it is safe. General instructions  Take over-the-counter and prescription medicines only as told by your health care provider.  Do not take baths, swim, or use a hot tub until your health care provider approves. Ask your health care provider if you may take showers. You may only be allowed to take sponge baths.  Do not drive for 24 hours if you were given a sedative during your procedure.  Wear a medical alert bracelet in case of an emergency. This will tell any health care providers that you have a port.  Keep all follow-up visits as told by your health care provider. This is important. Contact a health care provider if:  You cannot flush your port with saline as directed, or you cannot draw blood from the port.  You have a fever or chills.  You have redness, swelling, or pain around your port insertion site.  You have fluid or blood coming from your port insertion site.  Your port insertion site feels warm to the touch.  You have pus or a bad smell coming from the port insertion site. Get help right away if:  You have chest pain or shortness of breath.  You have bleeding from your port that you cannot control. Summary  Take care of the port as told by your   health care provider. Keep the manufacturer's information card with you at all times.  Change your dressing as told by your health care provider.  Contact a health care provider if you have a fever or chills or if you have redness, swelling, or pain around your port insertion site.  Keep all follow-up visits as told by your health care provider. This information is not intended to replace advice given to you by your health care provider. Make sure you discuss any questions you have with your health care provider. Document Revised: 03/25/2018 Document Reviewed: 03/25/2018 Elsevier Patient Education   2021 Elsevier Inc.  

## 2021-01-04 NOTE — Telephone Encounter (Signed)
appts made per 01/04/21 los and pt to gain sch in tx/avs as well through my chart   Jimmy Blankenship

## 2021-01-05 LAB — T4: T4, Total: 8.5 ug/dL (ref 4.5–12.0)

## 2021-01-06 ENCOUNTER — Other Ambulatory Visit: Payer: Self-pay

## 2021-01-06 ENCOUNTER — Inpatient Hospital Stay: Payer: Medicare Other

## 2021-01-06 VITALS — BP 132/64 | HR 78 | Temp 98.3°F | Resp 18

## 2021-01-06 DIAGNOSIS — Z5112 Encounter for antineoplastic immunotherapy: Secondary | ICD-10-CM | POA: Diagnosis not present

## 2021-01-06 DIAGNOSIS — C169 Malignant neoplasm of stomach, unspecified: Secondary | ICD-10-CM

## 2021-01-06 DIAGNOSIS — C165 Malignant neoplasm of lesser curvature of stomach, unspecified: Secondary | ICD-10-CM

## 2021-01-06 DIAGNOSIS — C799 Secondary malignant neoplasm of unspecified site: Secondary | ICD-10-CM

## 2021-01-06 MED ORDER — HEPARIN SOD (PORK) LOCK FLUSH 100 UNIT/ML IV SOLN
500.0000 [IU] | Freq: Once | INTRAVENOUS | Status: AC | PRN
  Administered 2021-01-06: 500 [IU]
  Filled 2021-01-06: qty 5

## 2021-01-06 MED ORDER — SODIUM CHLORIDE 0.9% FLUSH
10.0000 mL | INTRAVENOUS | Status: DC | PRN
  Administered 2021-01-06: 10 mL
  Filled 2021-01-06: qty 10

## 2021-01-06 NOTE — Patient Instructions (Signed)
Tunneled Central Venous Catheter Flushing Guide  It is important to flush your tunneled central venous catheter each time you use it, both before and after you use it. Flushing your catheter will help prevent it from clogging. What are the risks? Risks may include:  Infection.  Air getting into the catheter and bloodstream. Supplies needed:  A clean pair of gloves.  A disinfecting wipe. Use an alcohol wipe, chlorhexidine wipe, or iodine wipe as told by your health care provider.  A 10 mL syringe that has been prefilled with saline solution.  An empty 10 mL syringe, if a substance called heparin was injected into your catheter. How to flush your catheter When you flush your catheter, make sure you follow any specific instructions from your health care provider or the manufacturer. These are general guidelines. Flushing your catheter before use If there is heparin in your catheter: 1. Wash your hands with soap and water. 2. Put on gloves. 3. Scrub the injection cap for a minimum of 15 seconds with a disinfecting wipe. 4. Unclamp the catheter. 5. Attach the empty syringe to the injection cap. 6. Pull the syringe plunger back and withdraw 10 mL of blood. 7. Place the syringe into an appropriate waste container. 8. Scrub the injection cap for 15 seconds with a disinfecting wipe. 9. Attach the prefilled syringe to the injection cap. 10. Flush the catheter by pushing the plunger forward until all the liquid from the syringe is in the catheter. 11. Remove the syringe from the injection cap. 12. Clamp the catheter. If there is no heparin in your catheter: 1. Wash your hands with soap and water. 2. Put on gloves. 3. Scrub the injection cap for 15 seconds with a disinfecting wipe. 4. Unclamp the catheter. 5. Attach the prefilled syringe to the injection cap. 6. Flush the catheter by pushing the plunger forward until 5 mL of the liquid from the syringe is in the catheter. 7. Pull back on  the syringe until you see blood in the catheter. 8. If you have been asked to collect any blood, follow your health care provider's instructions. Otherwise, flush the catheter with the rest of the solution from the syringe. 9. Remove the syringe from the injection cap. 10. Clamp the catheter.   Flushing your catheter after use 1. Wash your hands with soap and water. 2. Put on gloves. 3. Scrub the injection cap for 15 seconds with a disinfecting wipe. 4. Unclamp the catheter. 5. Attach the prefilled syringe to the injection cap. 6. Flush the catheter by pushing the plunger forward until all of the liquid from the syringe is in the catheter. 7. Remove the syringe from the injection cap. 8. Clamp the catheter. Problems and solutions  If blood cannot be completely cleared from the injection cap, you may need to have the injection cap replaced.  If the catheter is difficult to flush, use the pulsing method. The pulsing method involves pushing only a few milliliters of solution into the catheter at a time and pausing between pushes.  If you do not see blood in the catheter when you pull back on the syringe, change your body position, such as by raising your arms above your head. Take a deep breath and cough. Then, pull back on the syringe. If you still do not see blood, flush the catheter with a small amount of solution. Then, change positions again and take a breath or cough. Pull back on the syringe again. If you still do not   see blood, finish flushing the catheter and contact your health care provider. Do not use your catheter until your health care provider says it is okay. General tips  Have someone help you flush your catheter, if possible.  Do not force fluid through your catheter.  Do not use a syringe that is larger or smaller than 10 mL. Using a smaller syringe can make the catheter burst.  Do not use your catheter without flushing it first if it has heparin in it. Contact a health  care provider if:  You cannot see any blood in the catheter when you flush it before using it.  Your catheter is difficult to flush. Get help right away if:  You cannot flush the catheter.  The catheter leaks when you flush it or when there is fluid in it.  There are cracks or breaks in the catheter. Summary  It is important to flush your tunneled central venous catheter each time you use it, both before and after you use it.  Scrub the injection cap for 15 seconds with a disinfecting wipe before and after you flush it.  When you flush your catheter, make sure you follow any specific instructions from your health care provider or the manufacturer.  Get help right away if you cannot flush the catheter. This information is not intended to replace advice given to you by your health care provider. Make sure you discuss any questions you have with your health care provider. Document Revised: 11/05/2019 Document Reviewed: 11/12/2018 Elsevier Patient Education  2021 Elsevier Inc.  

## 2021-01-17 ENCOUNTER — Inpatient Hospital Stay: Payer: Medicare Other | Admitting: Hematology & Oncology

## 2021-01-17 ENCOUNTER — Inpatient Hospital Stay: Payer: Medicare Other

## 2021-01-19 ENCOUNTER — Inpatient Hospital Stay: Payer: Medicare Other

## 2021-01-23 ENCOUNTER — Encounter (HOSPITAL_BASED_OUTPATIENT_CLINIC_OR_DEPARTMENT_OTHER): Payer: Self-pay

## 2021-01-23 ENCOUNTER — Other Ambulatory Visit: Payer: Self-pay

## 2021-01-23 ENCOUNTER — Ambulatory Visit (HOSPITAL_BASED_OUTPATIENT_CLINIC_OR_DEPARTMENT_OTHER)
Admission: RE | Admit: 2021-01-23 | Discharge: 2021-01-23 | Disposition: A | Discharge status: Home / Self Care | Payer: Medicare Other | Source: Ambulatory Visit | Attending: Hematology & Oncology | Admitting: Hematology & Oncology

## 2021-01-23 DIAGNOSIS — C165 Malignant neoplasm of lesser curvature of stomach, unspecified: Secondary | ICD-10-CM | POA: Insufficient documentation

## 2021-01-23 IMAGING — CT CT CHEST-ABD-PELV W/ CM
2 of 5 series · 12 of 36 positions shown, 14 images · IV contrast (Omnipaque)
Comparison: CT chest abdomen pelvis, [DATE], MR abdomen,
[DATE]

CLINICAL DATA: Metastatic gastric cancer, assess treatment
response, last chemotherapy 3 weeks ago

EXAM:
CT CHEST, ABDOMEN, AND PELVIS WITH CONTRAST
TECHNIQUE: Multidetector CT imaging of the chest, abdomen and pelvis was
performed following the standard protocol during bolus
administration of intravenous contrast.
CONTRAST:  75mL OMNIPAQUE IOHEXOL 300 MG/ML SOLN, additional oral
enteric contrast

[Series 2: cap with 2 · axial · 0.85mm/px · z∈[-698,-73]mm · 9 of 153 slices shown, 11 images]
[im 14/153  mediastinal]
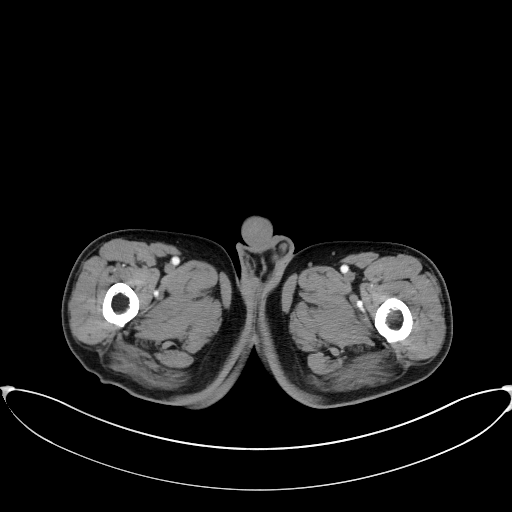
[im 14/153  bone]
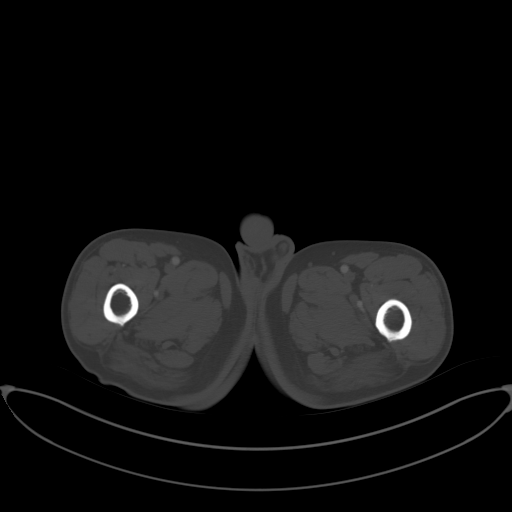
[im 28/153  mediastinal]
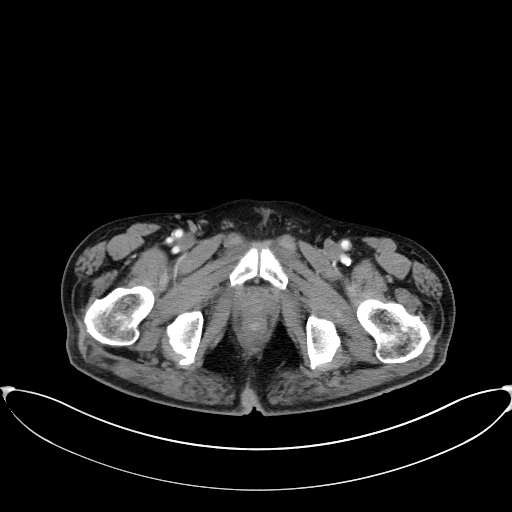
[im 42/153  mediastinal]
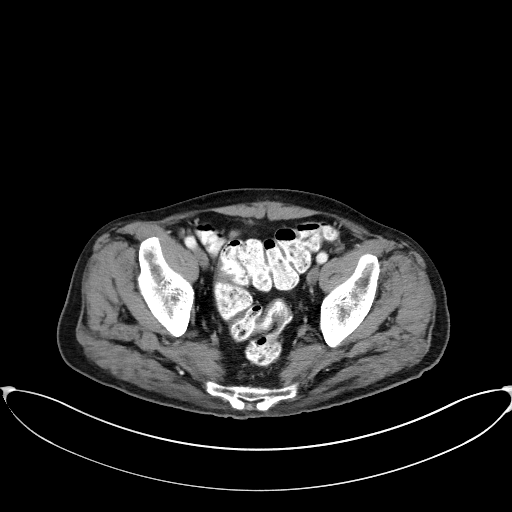
[im 56/153  mediastinal]
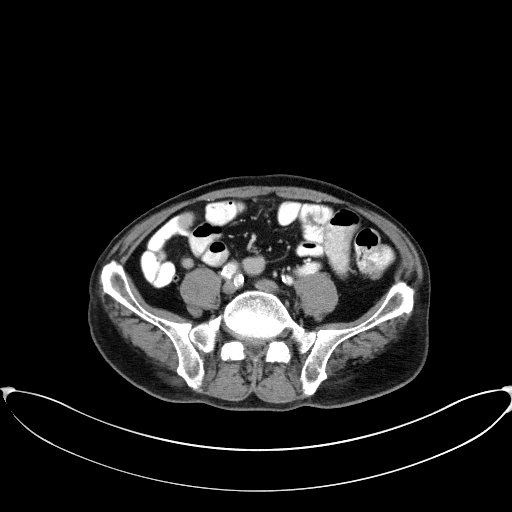
[im 83/153  mediastinal]
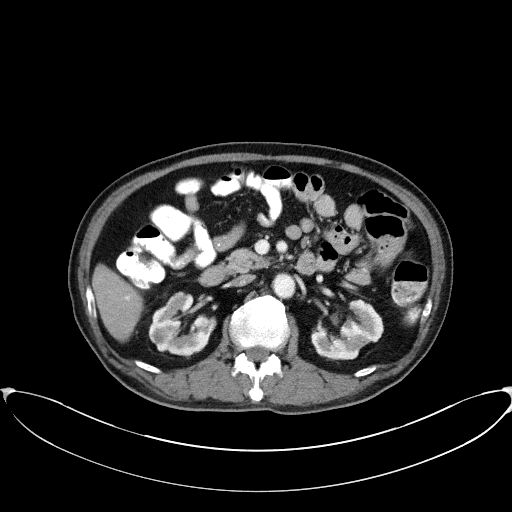
[im 97/153  mediastinal]
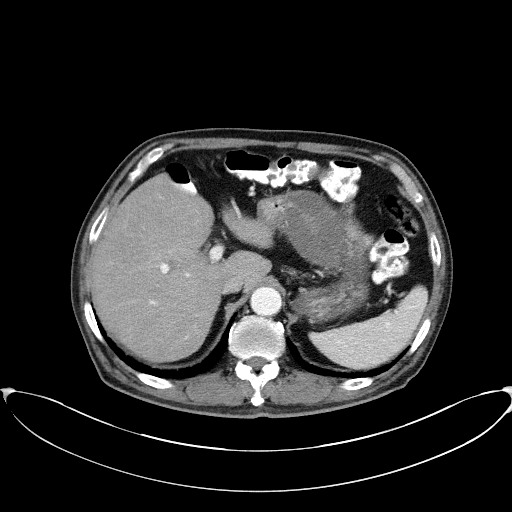
[im 111/153  mediastinal]
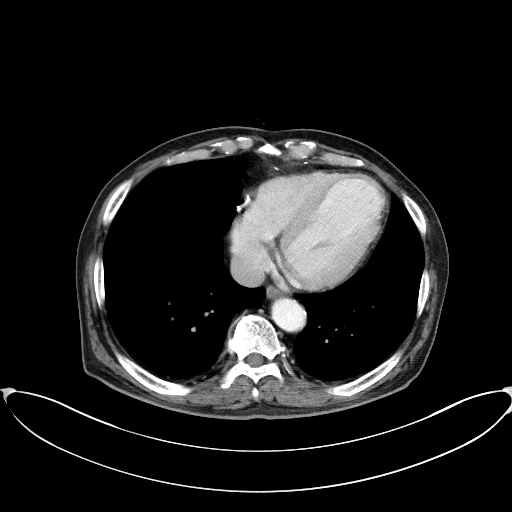
[im 125/153  mediastinal]
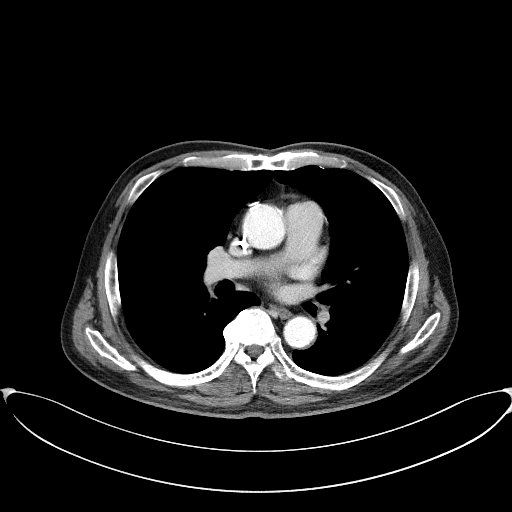
[im 139/153  mediastinal]
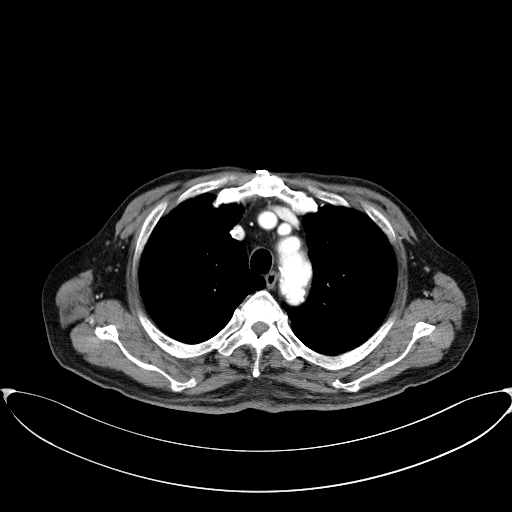
[im 139/153  bone]
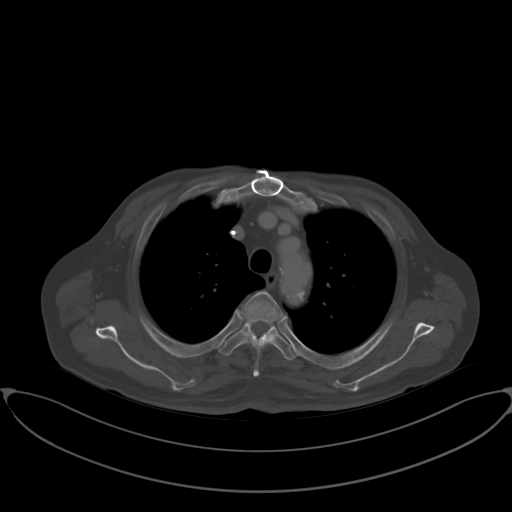

[Series 5: coronals · coronal · 0.80mm/px · 3 of 126 slices shown]
[im 26/126  mediastinal]
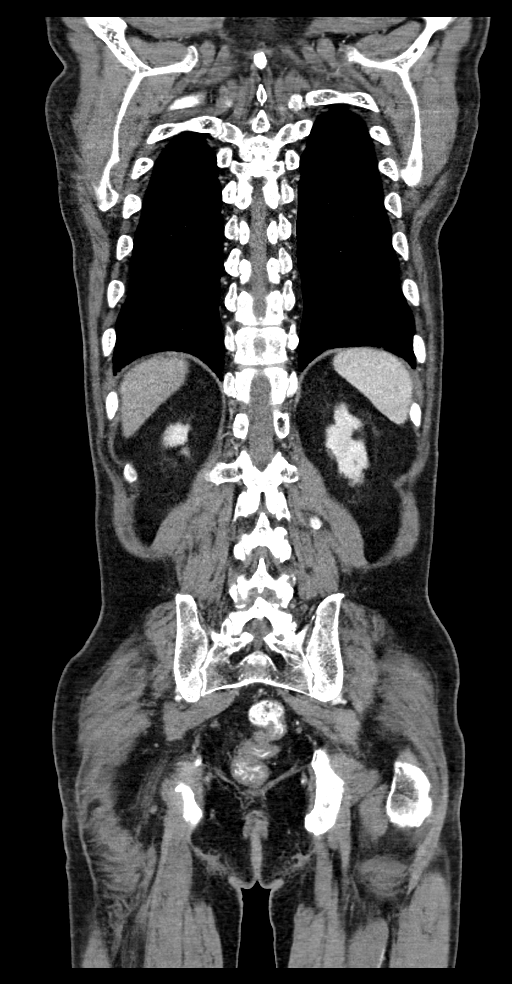
[im 51/126  mediastinal]
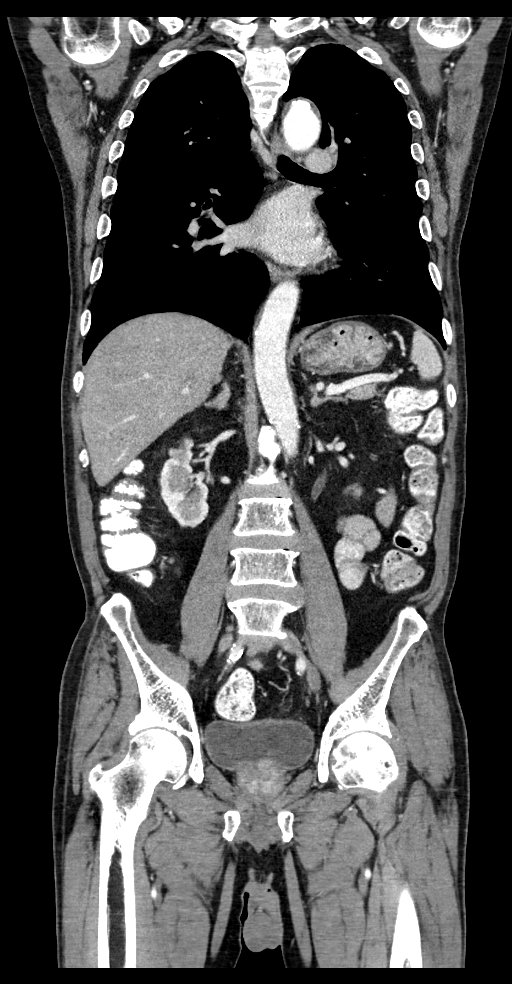
[im 76/126  mediastinal]
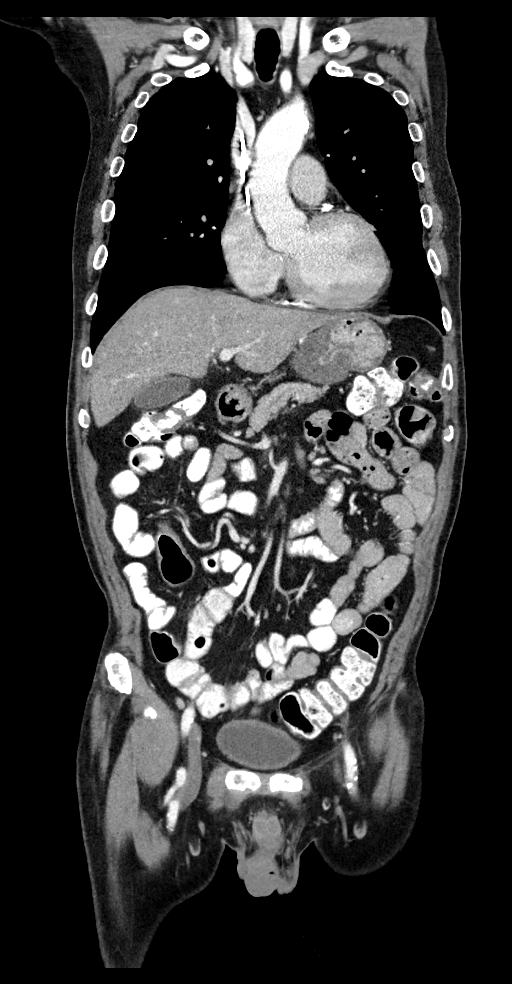

[12 of 36 positions shown; findings below may reference images not displayed]

FINDINGS: CT CHEST FINDINGS

Cardiovascular: Right chest port catheter. Aortic atherosclerosis.
Normal heart size. Three-vessel coronary artery calcifications
and/or stents. No pericardial effusion.

Mediastinum/Nodes: No enlarged mediastinal, hilar, or axillary lymph
nodes. Redemonstrated 1.3 cm low-attenuation nodule of the right
lobe of the thyroid. Not clinically significant; no follow-up
imaging recommended (ref: [HOSPITAL]. [DATE]): 143-50).
Trachea, and esophagus demonstrate no significant findings.

Lungs/Pleura: Mild centrilobular and paraseptal emphysema. New
scattered ground-glass opacity of the dependent right lung base
(series 4, image 85). Stable, benign 4 mm fissural nodule of the
right lower lobe (series 4, image 81). No pleural effusion or
pneumothorax.

Musculoskeletal: No chest wall mass or suspicious bone lesions
identified.

CT ABDOMEN PELVIS FINDINGS

Hepatobiliary: Very subtle hypodense liver lesions are again noted,
not appreciably changed compared to prior CT examination and best
characterized as metastatic lesions by prior MR. The most visible
and measurable index lesion in the posterior right lobe of the
liver, hepatic segment VI, measures 1.2 x 1.0 cm, not significantly
changed (series 2, image 59). Additional index lesion in the tip of
the right lobe of the liver measures 1.5 x 1.4 cm, not significantly
changed (series 2, image 66). Hepatic steatosis. No gallstones,
gallbladder wall thickening, or biliary dilatation.

Pancreas: Unremarkable. No pancreatic ductal dilatation or
surrounding inflammatory changes.

Spleen: Normal in size without significant abnormality.

Adrenals/Urinary Tract: Adrenal glands are unremarkable. Lobulated
renal cortices, in keeping with prior infectious or obstructive
insult. Kidneys are otherwise normal, without renal calculi, solid
lesion, or hydronephrosis. Bladder is unremarkable.

Stomach/Bowel: Redemonstrated, partially circumferential mucosal
thickening of the gastric body, not significantly changed compared
to prior examination. Thickness of the gastric mucosa is
approximately 1.8 cm at its greatest, unchanged (series 5, image
83). Appendix appears normal. No evidence of bowel wall thickening,
distention, or inflammatory changes.

Vascular/Lymphatic: Aortic atherosclerosis. No enlarged abdominal or
pelvic lymph nodes.

Reproductive: Prostatomegaly.

Other: No abdominal wall hernia or abnormality. No abdominopelvic
ascites.

Musculoskeletal: No acute or significant osseous findings.
IMPRESSION: 1. Redemonstrated, partially circumferential mucosal thickening of
the gastric body, not significantly changed compared to prior
examination, in keeping with treated gastric malignancy. Thickness
of the gastric mucosa is approximately 1.8 cm at its greatest,
unchanged.
2. Very subtle hypodense liver lesions are again noted, not
appreciably changed compared to prior CT examination and best
characterized as metastatic lesions by prior MRI.
3. No evidence of new metastatic disease in the chest, abdomen, or
pelvis.
4. New scattered ground-glass opacity of the dependent right lung
base, nonspecific and infectious or inflammatory.
5. Emphysema.
6. Coronary artery disease.

Aortic Atherosclerosis ([RE]-[RE]) and Emphysema ([RE]-[RE]).

## 2021-01-23 MED ORDER — IOHEXOL 300 MG/ML  SOLN
75.0000 mL | Freq: Once | INTRAMUSCULAR | Status: AC | PRN
  Administered 2021-01-23: 75 mL via INTRAVENOUS

## 2021-01-25 ENCOUNTER — Inpatient Hospital Stay: Payer: Medicare Other

## 2021-01-25 ENCOUNTER — Encounter: Payer: Self-pay | Admitting: Hematology & Oncology

## 2021-01-25 ENCOUNTER — Inpatient Hospital Stay: Payer: Medicare Other | Attending: Hematology & Oncology

## 2021-01-25 ENCOUNTER — Other Ambulatory Visit: Payer: Self-pay

## 2021-01-25 ENCOUNTER — Inpatient Hospital Stay (HOSPITAL_BASED_OUTPATIENT_CLINIC_OR_DEPARTMENT_OTHER): Payer: Medicare Other | Admitting: Hematology & Oncology

## 2021-01-25 VITALS — BP 142/72 | HR 69 | Temp 98.3°F | Resp 17 | Wt 141.0 lb

## 2021-01-25 DIAGNOSIS — C169 Malignant neoplasm of stomach, unspecified: Secondary | ICD-10-CM

## 2021-01-25 DIAGNOSIS — Z5112 Encounter for antineoplastic immunotherapy: Secondary | ICD-10-CM | POA: Insufficient documentation

## 2021-01-25 DIAGNOSIS — D500 Iron deficiency anemia secondary to blood loss (chronic): Secondary | ICD-10-CM | POA: Diagnosis not present

## 2021-01-25 DIAGNOSIS — C165 Malignant neoplasm of lesser curvature of stomach, unspecified: Secondary | ICD-10-CM | POA: Insufficient documentation

## 2021-01-25 DIAGNOSIS — Z79899 Other long term (current) drug therapy: Secondary | ICD-10-CM | POA: Insufficient documentation

## 2021-01-25 DIAGNOSIS — C799 Secondary malignant neoplasm of unspecified site: Secondary | ICD-10-CM

## 2021-01-25 DIAGNOSIS — C787 Secondary malignant neoplasm of liver and intrahepatic bile duct: Secondary | ICD-10-CM | POA: Diagnosis not present

## 2021-01-25 LAB — IRON AND TIBC
Iron: 46 ug/dL (ref 42–163)
Saturation Ratios: 15 % — ABNORMAL LOW (ref 20–55)
TIBC: 299 ug/dL (ref 202–409)
UIBC: 253 ug/dL (ref 117–376)

## 2021-01-25 LAB — CMP (CANCER CENTER ONLY)
ALT: 15 U/L (ref 0–44)
AST: 17 U/L (ref 15–41)
Albumin: 3.7 g/dL (ref 3.5–5.0)
Alkaline Phosphatase: 78 U/L (ref 38–126)
Anion gap: 9 (ref 5–15)
BUN: 14 mg/dL (ref 8–23)
CO2: 27 mmol/L (ref 22–32)
Calcium: 9.4 mg/dL (ref 8.9–10.3)
Chloride: 105 mmol/L (ref 98–111)
Creatinine: 0.94 mg/dL (ref 0.61–1.24)
GFR, Estimated: >60 mL/min (ref >60)
Glucose, Bld: 113 mg/dL — ABNORMAL HIGH (ref 70–99)
Potassium: 3.5 mmol/L (ref 3.5–5.1)
Sodium: 141 mmol/L (ref 135–145)
Total Bilirubin: 0.4 mg/dL (ref 0.3–1.2)
Total Protein: 6.1 g/dL — ABNORMAL LOW (ref 6.5–8.1)

## 2021-01-25 LAB — CBC WITH DIFFERENTIAL (CANCER CENTER ONLY)
Abs Immature Granulocytes: 0.31 10*3/uL — ABNORMAL HIGH (ref 0.00–0.07)
Basophils Absolute: 0 10*3/uL (ref 0.0–0.1)
Basophils Relative: 1 %
Eosinophils Absolute: 0.1 10*3/uL (ref 0.0–0.5)
Eosinophils Relative: 2 %
HCT: 33.7 % — ABNORMAL LOW (ref 39.0–52.0)
Hemoglobin: 10.8 g/dL — ABNORMAL LOW (ref 13.0–17.0)
Immature Granulocytes: 6 %
Lymphocytes Relative: 23 %
Lymphs Abs: 1.3 10*3/uL (ref 0.7–4.0)
MCH: 28.5 pg (ref 26.0–34.0)
MCHC: 32 g/dL (ref 30.0–36.0)
MCV: 88.9 fL (ref 80.0–100.0)
Monocytes Absolute: 0.9 10*3/uL (ref 0.1–1.0)
Monocytes Relative: 17 %
Neutro Abs: 2.9 10*3/uL (ref 1.7–7.7)
Neutrophils Relative %: 51 %
Platelet Count: 117 10*3/uL — ABNORMAL LOW (ref 150–400)
RBC: 3.79 MIL/uL — ABNORMAL LOW (ref 4.22–5.81)
RDW: 16.2 % — ABNORMAL HIGH (ref 11.5–15.5)
WBC Count: 5.6 10*3/uL (ref 4.0–10.5)
nRBC: 0.5 % — ABNORMAL HIGH (ref 0.0–0.2)

## 2021-01-25 LAB — FERRITIN: Ferritin: 381 ng/mL — ABNORMAL HIGH (ref 24–336)

## 2021-01-25 LAB — TSH: TSH: 1.844 u[IU]/mL (ref 0.320–4.118)

## 2021-01-25 MED ORDER — HEPARIN SOD (PORK) LOCK FLUSH 100 UNIT/ML IV SOLN
500.0000 [IU] | Freq: Once | INTRAVENOUS | Status: AC
  Administered 2021-01-25: 500 [IU] via INTRAVENOUS
  Filled 2021-01-25: qty 5

## 2021-01-25 MED ORDER — ACETAMINOPHEN 325 MG PO TABS
ORAL_TABLET | ORAL | Status: AC
  Filled 2021-01-25: qty 2

## 2021-01-25 MED ORDER — DIPHENHYDRAMINE HCL 25 MG PO CAPS
ORAL_CAPSULE | ORAL | Status: AC
  Filled 2021-01-25: qty 2

## 2021-01-25 MED ORDER — PALONOSETRON HCL INJECTION 0.25 MG/5ML
INTRAVENOUS | Status: AC
  Filled 2021-01-25: qty 5

## 2021-01-25 MED ORDER — SODIUM CHLORIDE 0.9% FLUSH
10.0000 mL | Freq: Once | INTRAVENOUS | Status: AC
  Administered 2021-01-25: 10 mL via INTRAVENOUS
  Filled 2021-01-25: qty 10

## 2021-01-25 NOTE — Patient Instructions (Signed)

## 2021-01-25 NOTE — Addendum Note (Signed)
Addended by: Shelda Altes on: 01/25/2021 09:44 AM   Modules accepted: Orders

## 2021-01-25 NOTE — Progress Notes (Signed)
Hematology and Oncology Follow Up Visit  Jimmy Blankenship [id number] [birth year] 70 y.o. 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  We did go ahead and do a CT scan on him.  This was done on 01/23/2021.  The CT scan showed stable disease.  This bothers me somewhat.  He has had 8 cycles of chemotherapy along with Herceptin and nivolumab.  I really thought we would be able to see much more of a response right now.  I still think that the key is the fact that this tumor is HER2 positive.  As such, I will try him on single agent Enhertu.  I think this would be very reasonable.  I would Raynelle Jan think that he would respond.  He is feeling okay.  He has had little bit of fatigue after the last chemotherapy.  His appetite was down a little bit.  He has had no nausea or vomiting.  He has had little bit of diarrhea.  Is no bleeding.  He has had no problems with pain.  Currently, his performance status is ECOG 1.     Medications:  Current Outpatient Medications:  .  atorvastatin (LIPITOR) 80 MG tablet, Take 1 tablet (80 mg total) by mouth daily., Disp: 90 tablet, Rfl: 3 .  canagliflozin (INVOKANA) 100 MG TABS tablet, Take 100 mg by mouth daily., Disp: , Rfl:  .  clopidogrel (PLAVIX) 75 MG tablet, Take 75 mg by mouth daily., Disp: , Rfl:  .  Cyanocobalamin (VITAMIN B-12 PO), Take 1 tablet by mouth daily., Disp: , Rfl:  .  CYANOCOBALAMIN SL, Take by mouth., Disp: , Rfl:  .  cyclobenzaprine (FEXMID) 7.5 MG tablet, Take 7.5 mg by mouth at bedtime., Disp: , Rfl:  .  dexamethasone (DECADRON) 4 MG tablet, Take 2  tablets (8 mg total) by mouth daily. Start the day after chemotherapy for 2 days. Take with food., Disp: 30 tablet, Rfl: 1 .  feeding supplement (ENSURE ENLIVE / ENSURE PLUS) LIQD, Take 237 mLs by mouth 2 (two) times daily between meals., Disp: 237 mL, Rfl: 12 .  gabapentin (NEURONTIN) 300 MG capsule, Take 1 capsule (300 mg total) by mouth 2 (two) times daily., Disp: 60 capsule, Rfl: 5 .  glimepiride (AMARYL) 2 MG tablet, Take 1 tablet (2 mg total) by mouth 2 (two) times daily., Disp: 90 tablet, Rfl: 0 .  lidocaine-prilocaine (EMLA) cream, Apply to affected area once, Disp: 30 g, Rfl: 3 .  metFORMIN (GLUCOPHAGE) 1000 MG tablet, Take 1,000 mg by mouth 2 (two) times daily with a meal., Disp: , Rfl:  .  metoprolol tartrate (LOPRESSOR) 50 MG tablet, TAKE ONE TABLET BY MOUTH TWICE A DAY, Disp: 180 tablet, Rfl: 3 .  ondansetron (ZOFRAN) 8 MG tablet, Take 1 tablet (8 mg total) by mouth 2 (two) times daily as needed for refractory nausea / vomiting. Start on day 3 after chemotherapy., Disp: 30 tablet, Rfl: 1 .  pantoprazole (PROTONIX) 40 MG tablet, Take 1 tablet (40 mg total) by mouth 2 (two) times daily., Disp: 60 tablet, Rfl: 1 .  prochlorperazine (  COMPAZINE) 10 MG tablet, Take 1 tablet (10 mg total) by mouth every 6 (six) hours as needed (Nausea or vomiting)., Disp: 30 tablet, Rfl: 1  Allergies: No Known Allergies  Past Medical History, Surgical history, Social history, and Family History were reviewed and updated.  Review of Systems: Review of Systems  Constitutional: Negative.   HENT:  Negative.   Eyes: Negative.   Respiratory: Negative.   Cardiovascular: Negative.   Endocrine: Negative.   Genitourinary: Negative.    Musculoskeletal: Negative.   Skin: Negative.   Neurological: Negative.   Hematological: Negative.   Psychiatric/Behavioral: Negative.     Physical Exam:  weight is 141 lb (64 kg). His oral temperature is 98.3 F (36.8 C). His blood pressure is 142/72 (abnormal) and his  pulse is 69. His respiration is 17 and oxygen saturation is 98%.   Wt Readings from Last 3 Encounters:  01/25/21 141 lb (64 kg)  01/04/21 141 lb (64 kg)  12/21/20 141 lb (64 kg)    Physical Exam Vitals reviewed.  HENT:     Head: Normocephalic and atraumatic.  Eyes:     Pupils: Pupils are equal, round, and reactive to light.  Cardiovascular:     Rate and Rhythm: Normal rate and regular rhythm.     Heart sounds: Normal heart sounds.  Pulmonary:     Effort: Pulmonary effort is normal.     Breath sounds: Normal breath sounds.  Abdominal:     General: Bowel sounds are normal.     Palpations: Abdomen is soft.  Musculoskeletal:        General: No tenderness or deformity. Normal range of motion.     Cervical back: Normal range of motion.  Lymphadenopathy:     Cervical: No cervical adenopathy.  Skin:    General: Skin is warm and dry.     Findings: No erythema or rash.  Neurological:     Mental Status: He is alert and oriented to person, place, and time.  Psychiatric:        Behavior: Behavior normal.        Thought Content: Thought content normal.        Judgment: Judgment normal.    Lab Results  Component Value Date   WBC 5.6 01/25/2021   HGB 10.8 (L) 01/25/2021   HCT 33.7 (L) 01/25/2021   MCV 88.9 01/25/2021   PLT 117 (L) 01/25/2021     Chemistry      Component Value Date/Time   NA 141 01/25/2021 0830   NA 140 06/10/2020 0935   K 3.5 01/25/2021 0830   CL 105 01/25/2021 0830   CO2 27 01/25/2021 0830   BUN 14 01/25/2021 0830   BUN 17 06/10/2020 0935   CREATININE 0.94 01/25/2021 0830      Component Value Date/Time   CALCIUM 9.4 01/25/2021 0830   ALKPHOS 78 01/25/2021 0830   AST 17 01/25/2021 0830   ALT 15 01/25/2021 0830   BILITOT 0.4 01/25/2021 0830      Impression and Plan: Jimmy Blankenship is a very nice 70 year old [name] male.  He has metastatic adenocarcinoma of the stomach.  He has had 7 cycles of chemotherapy along with immunotherapy and targeted therapy.    We will go ahead and try him with Enhertu now.  I think this would be very reasonable and I do think that he will respond.  I think would be nice to keep him off of actual chemotherapy for as long as possible.  He is still  planning on going to [place] in August.  I would like to think that we can get him there so we can enjoy his family.  We will go ahead and get started with Enhertu next week.  We will give 3 treatments and then repeat his CT scans.  I am happy to see that his hemoglobin is trending upward.  I think this is a good sign that his iron stores are being replenished.  Volanda Napoleon, MD 5/18/20229:22 AM

## 2021-01-25 NOTE — Progress Notes (Signed)
DISCONTINUE ON PATHWAY REGIMEN - Gastroesophageal     A cycle is every 14 days:     Nivolumab      Oxaliplatin      Leucovorin      Fluorouracil      Fluorouracil   **Always confirm dose/schedule in your pharmacy ordering system**  REASON: Toxicities / Adverse Event PRIOR TREATMENT: [id number]: Nivolumab 240 mg + mFOLFOX6 q14 Days Until Progression, Unacceptable Toxicity, or up to 24 Months TREATMENT RESPONSE: Stable Disease (SD)  START OFF PATHWAY REGIMEN - Gastroesophageal   [id number]:Fam-trastuzumab deruxtecan-nxki 6.4 mg/kg IV D1 q21 Days:   A cycle is every 21 days:     Fam-trastuzumab deruxtecan-nxki   **Always confirm dose/schedule in your pharmacy ordering system**  Patient Characteristics: Distant Metastases (cM1/pM1) / Locally Recurrent Disease, Adenocarcinoma - Esophageal, GE Junction, and Gastric, Second Line, MSS/pMMR or MSI Unknown Histology: Adenocarcinoma Disease Classification: Gastric Therapeutic Status: Distant Metastases (No Additional Staging) Line of Therapy: Second Line Microsatellite/Mismatch Repair Status: MSS/pMMR Intent of Therapy: Non-Curative / Palliative Intent, Discussed with Patient

## 2021-01-26 LAB — T4: T4, Total: 7.3 ug/dL (ref 4.5–12.0)

## 2021-01-27 ENCOUNTER — Inpatient Hospital Stay: Payer: Medicare Other

## 2021-02-01 ENCOUNTER — Inpatient Hospital Stay: Payer: Medicare Other

## 2021-02-01 ENCOUNTER — Other Ambulatory Visit: Payer: Self-pay

## 2021-02-01 VITALS — BP 151/86 | HR 69 | Temp 97.5°F | Resp 18

## 2021-02-01 DIAGNOSIS — C799 Secondary malignant neoplasm of unspecified site: Secondary | ICD-10-CM

## 2021-02-01 DIAGNOSIS — C165 Malignant neoplasm of lesser curvature of stomach, unspecified: Secondary | ICD-10-CM

## 2021-02-01 DIAGNOSIS — Z5112 Encounter for antineoplastic immunotherapy: Secondary | ICD-10-CM | POA: Diagnosis not present

## 2021-02-01 MED ORDER — LIDOCAINE-PRILOCAINE 2.5-2.5 % EX CREA: TOPICAL_CREAM | CUTANEOUS | 3 refills | Status: DC

## 2021-02-01 MED ORDER — DEXAMETHASONE 4 MG PO TABS: 8.0000 mg | ORAL_TABLET | Freq: Every day | ORAL | 1 refills | Status: DC

## 2021-02-01 MED ORDER — HEPARIN SOD (PORK) LOCK FLUSH 100 UNIT/ML IV SOLN
500.0000 [IU] | Freq: Once | INTRAVENOUS | Status: AC | PRN
  Administered 2021-02-01: 500 [IU]
  Filled 2021-02-01: qty 5

## 2021-02-01 MED ORDER — DIPHENHYDRAMINE HCL 25 MG PO CAPS
ORAL_CAPSULE | ORAL | Status: AC
  Filled 2021-02-01: qty 2

## 2021-02-01 MED ORDER — DIPHENHYDRAMINE HCL 25 MG PO CAPS
50.0000 mg | ORAL_CAPSULE | Freq: Once | ORAL | Status: AC
  Administered 2021-02-01: 50 mg via ORAL

## 2021-02-01 MED ORDER — PALONOSETRON HCL INJECTION 0.25 MG/5ML
0.2500 mg | Freq: Once | INTRAVENOUS | Status: AC
  Administered 2021-02-01: 0.25 mg via INTRAVENOUS

## 2021-02-01 MED ORDER — LORAZEPAM 0.5 MG PO TABS: 0.5000 mg | ORAL_TABLET | Freq: Four times a day (QID) | ORAL | 0 refills | Status: DC | PRN

## 2021-02-01 MED ORDER — ONDANSETRON HCL 8 MG PO TABS: 8.0000 mg | ORAL_TABLET | Freq: Two times a day (BID) | ORAL | 1 refills | Status: DC | PRN

## 2021-02-01 MED ORDER — DEXTROSE 5 % IV SOLN
Freq: Once | INTRAVENOUS | Status: AC
  Filled 2021-02-01: qty 250

## 2021-02-01 MED ORDER — PALONOSETRON HCL INJECTION 0.25 MG/5ML
INTRAVENOUS | Status: AC
  Filled 2021-02-01: qty 5

## 2021-02-01 MED ORDER — ACETAMINOPHEN 325 MG PO TABS
650.0000 mg | ORAL_TABLET | Freq: Once | ORAL | Status: AC
  Administered 2021-02-01: 650 mg via ORAL

## 2021-02-01 MED ORDER — DEXTROSE 5 % IV SOLN
400.0000 mg | Freq: Once | INTRAVENOUS | Status: AC
  Administered 2021-02-01: 400 mg via INTRAVENOUS
  Filled 2021-02-01: qty 20

## 2021-02-01 MED ORDER — SODIUM CHLORIDE 0.9% FLUSH
10.0000 mL | INTRAVENOUS | Status: DC | PRN
  Administered 2021-02-01: 10 mL
  Filled 2021-02-01: qty 10

## 2021-02-01 MED ORDER — SODIUM CHLORIDE 0.9 % IV SOLN
10.0000 mg | Freq: Once | INTRAVENOUS | Status: AC
  Administered 2021-02-01: 10 mg via INTRAVENOUS
  Filled 2021-02-01: qty 10

## 2021-02-01 MED ORDER — PROCHLORPERAZINE MALEATE 10 MG PO TABS: 10.0000 mg | ORAL_TABLET | Freq: Four times a day (QID) | ORAL | 1 refills | Status: DC | PRN

## 2021-02-01 MED ORDER — ACETAMINOPHEN 325 MG PO TABS
ORAL_TABLET | ORAL | Status: AC
  Filled 2021-02-01: qty 2

## 2021-02-01 NOTE — Patient Instructions (Signed)
[facility]  Discharge Instructions: Thank you for choosing [facility] to provide your oncology and hematology care.   If you have a lab appointment with the [facility], please go directly to the [facility] and check in at the registration area.  Wear comfortable clothing and clothing appropriate for easy access to any Portacath or PICC line.   We strive to give you quality time with your provider. You may need to reschedule your appointment if you arrive late (15 or more minutes).  Arriving late affects you and other patients whose appointments are after yours.  Also, if you miss three or more appointments without notifying the office, you may be dismissed from the clinic at the provider's discretion.      For prescription refill requests, have your pharmacy contact our office and allow 72 hours for refills to be completed.    Today you received the following chemotherapy and/or immunotherapy agents    To help prevent nausea and vomiting after your treatment, we encourage you to take your nausea medication as directed.  BELOW ARE SYMPTOMS THAT SHOULD BE REPORTED IMMEDIATELY: . *FEVER GREATER THAN 100.4 F (38 C) OR HIGHER . *CHILLS OR SWEATING . *NAUSEA AND VOMITING THAT IS NOT CONTROLLED WITH YOUR NAUSEA MEDICATION . *UNUSUAL SHORTNESS OF BREATH . *UNUSUAL BRUISING OR BLEEDING . *URINARY PROBLEMS (pain or burning when urinating, or frequent urination) . *BOWEL PROBLEMS (unusual diarrhea, constipation, pain near the anus) . TENDERNESS IN MOUTH AND THROAT WITH OR WITHOUT PRESENCE OF ULCERS (sore throat, sores in mouth, or a toothache) . UNUSUAL RASH, SWELLING OR PAIN  . UNUSUAL VAGINAL DISCHARGE OR ITCHING   Items with * indicate a potential emergency and should be followed up as soon as possible or go to the Emergency Department if any problems should occur.  Please show the CHEMOTHERAPY ALERT CARD or IMMUNOTHERAPY ALERT CARD at check-in  to the Emergency Department and triage nurse. Should you have questions after your visit or need to cancel or reschedule your appointment, please contact [facility]  [phone] and follow the prompts.  Office hours are 8:00 a.m. to 4:30 p.m. Monday - Friday. Please note that voicemails left after 4:00 p.m. may not be returned until the following business day.  We are closed weekends and major holidays. You have access to a nurse at all times for urgent questions. Please call the main number to the clinic [phone] and follow the prompts.  For any non-urgent questions, you may also contact your provider using MyChart. We now offer e-Visits for anyone 53 and older to request care online for non-urgent symptoms. For details visit [email or web address].   Also download the MyChart app! Go to the app store, search "MyChart", open the app, select [facility], and log in with your MyChart username and password.  Due to Covid, a mask is required upon entering the hospital/clinic. If you do not have a mask, one will be given to you upon arrival. For doctor visits, patients may have 1 support person aged 32 or older with them. For treatment visits, patients cannot have anyone with them due to current Covid guidelines and our immunocompromised population.

## 2021-02-01 NOTE — Patient Instructions (Signed)

## 2021-02-06 ENCOUNTER — Emergency Department (HOSPITAL_BASED_OUTPATIENT_CLINIC_OR_DEPARTMENT_OTHER)
Admission: EM | Admit: 2021-02-06 | Discharge: 2021-02-07 | Disposition: A | Discharge status: Home / Self Care | Payer: Medicare Other | Attending: Emergency Medicine | Admitting: Emergency Medicine

## 2021-02-06 ENCOUNTER — Other Ambulatory Visit: Payer: Self-pay

## 2021-02-06 ENCOUNTER — Encounter (HOSPITAL_BASED_OUTPATIENT_CLINIC_OR_DEPARTMENT_OTHER): Payer: Self-pay | Admitting: *Deleted

## 2021-02-06 DIAGNOSIS — R197 Diarrhea, unspecified: Secondary | ICD-10-CM | POA: Insufficient documentation

## 2021-02-06 DIAGNOSIS — I2510 Atherosclerotic heart disease of native coronary artery without angina pectoris: Secondary | ICD-10-CM | POA: Insufficient documentation

## 2021-02-06 DIAGNOSIS — Z7984 Long term (current) use of oral hypoglycemic drugs: Secondary | ICD-10-CM | POA: Insufficient documentation

## 2021-02-06 DIAGNOSIS — Z7902 Long term (current) use of antithrombotics/antiplatelets: Secondary | ICD-10-CM | POA: Insufficient documentation

## 2021-02-06 DIAGNOSIS — Z951 Presence of aortocoronary bypass graft: Secondary | ICD-10-CM | POA: Insufficient documentation

## 2021-02-06 DIAGNOSIS — Z87891 Personal history of nicotine dependence: Secondary | ICD-10-CM | POA: Insufficient documentation

## 2021-02-06 DIAGNOSIS — Z85028 Personal history of other malignant neoplasm of stomach: Secondary | ICD-10-CM | POA: Diagnosis not present

## 2021-02-06 DIAGNOSIS — E1122 Type 2 diabetes mellitus with diabetic chronic kidney disease: Secondary | ICD-10-CM | POA: Diagnosis not present

## 2021-02-06 DIAGNOSIS — Z79899 Other long term (current) drug therapy: Secondary | ICD-10-CM | POA: Diagnosis not present

## 2021-02-06 DIAGNOSIS — I129 Hypertensive chronic kidney disease with stage 1 through stage 4 chronic kidney disease, or unspecified chronic kidney disease: Secondary | ICD-10-CM | POA: Insufficient documentation

## 2021-02-06 DIAGNOSIS — N189 Chronic kidney disease, unspecified: Secondary | ICD-10-CM | POA: Diagnosis not present

## 2021-02-06 LAB — CBC WITH DIFFERENTIAL/PLATELET
Abs Immature Granulocytes: 0.03 10*3/uL (ref 0.00–0.07)
Basophils Absolute: 0 10*3/uL (ref 0.0–0.1)
Basophils Relative: 0 %
Eosinophils Absolute: 0.2 10*3/uL (ref 0.0–0.5)
Eosinophils Relative: 2 %
HCT: 37.2 % — ABNORMAL LOW (ref 39.0–52.0)
Hemoglobin: 11.9 g/dL — ABNORMAL LOW (ref 13.0–17.0)
Immature Granulocytes: 0 %
Lymphocytes Relative: 22 %
Lymphs Abs: 1.8 10*3/uL (ref 0.7–4.0)
MCH: 28.9 pg (ref 26.0–34.0)
MCHC: 32 g/dL (ref 30.0–36.0)
MCV: 90.3 fL (ref 80.0–100.0)
Monocytes Absolute: 0.4 10*3/uL (ref 0.1–1.0)
Monocytes Relative: 5 %
Neutro Abs: 5.6 10*3/uL (ref 1.7–7.7)
Neutrophils Relative %: 71 %
Platelets: 212 10*3/uL (ref 150–400)
RBC: 4.12 MIL/uL — ABNORMAL LOW (ref 4.22–5.81)
RDW: 15.7 % — ABNORMAL HIGH (ref 11.5–15.5)
WBC: 8.1 10*3/uL (ref 4.0–10.5)
nRBC: 0 % (ref 0.0–0.2)

## 2021-02-06 LAB — MAGNESIUM: Magnesium: 1.5 mg/dL — ABNORMAL LOW (ref 1.7–2.4)

## 2021-02-06 LAB — COMPREHENSIVE METABOLIC PANEL
ALT: 21 U/L (ref 0–44)
AST: 20 U/L (ref 15–41)
Albumin: 3.5 g/dL (ref 3.5–5.0)
Alkaline Phosphatase: 78 U/L (ref 38–126)
Anion gap: 9 (ref 5–15)
BUN: 25 mg/dL — ABNORMAL HIGH (ref 8–23)
CO2: 25 mmol/L (ref 22–32)
Calcium: 8.7 mg/dL — ABNORMAL LOW (ref 8.9–10.3)
Chloride: 103 mmol/L (ref 98–111)
Creatinine, Ser: 1.03 mg/dL (ref 0.61–1.24)
GFR, Estimated: >60 mL/min (ref >60)
Glucose, Bld: 124 mg/dL — ABNORMAL HIGH (ref 70–99)
Potassium: 4.2 mmol/L (ref 3.5–5.1)
Sodium: 137 mmol/L (ref 135–145)
Total Bilirubin: 0.6 mg/dL (ref 0.3–1.2)
Total Protein: 6.1 g/dL — ABNORMAL LOW (ref 6.5–8.1)

## 2021-02-06 LAB — LIPASE, BLOOD: Lipase: 51 U/L (ref 11–51)

## 2021-02-06 MED ORDER — MORPHINE SULFATE (PF) 2 MG/ML IV SOLN
2.0000 mg | Freq: Once | INTRAVENOUS | Status: AC
  Administered 2021-02-06: 2 mg via INTRAVENOUS
  Filled 2021-02-06: qty 1

## 2021-02-06 MED ORDER — SODIUM CHLORIDE 0.9 % IV BOLUS
1000.0000 mL | Freq: Once | INTRAVENOUS | Status: AC
  Administered 2021-02-06: 1000 mL via INTRAVENOUS

## 2021-02-06 MED ORDER — ONDANSETRON HCL 4 MG/2ML IJ SOLN
4.0000 mg | Freq: Once | INTRAMUSCULAR | Status: AC
  Administered 2021-02-06: 4 mg via INTRAVENOUS
  Filled 2021-02-06: qty 2

## 2021-02-06 NOTE — ED Notes (Signed)
Pt is a chemo pt. Being treated for stomach cancer. He is sitting away from other patients by the vending machines.

## 2021-02-06 NOTE — ED Triage Notes (Signed)
Abdominal pain and diarrhea since yesterday. No vomiting. He took Imodium tonight.

## 2021-02-07 MED ORDER — MAGNESIUM SULFATE 2 GM/50ML IV SOLN: 2.0000 g | Freq: Once | INTRAVENOUS | Status: DC

## 2021-02-07 MED ORDER — MAGNESIUM SULFATE 2 GM/50ML IV SOLN
2.0000 g | Freq: Once | INTRAVENOUS | Status: AC
  Administered 2021-02-07: 2 g via INTRAVENOUS
  Filled 2021-02-07: qty 50

## 2021-02-07 NOTE — ED Provider Notes (Signed)
[facility] Provider Note   CSN: [id number] Arrival date & time: 02/06/21  2129     History No chief complaint on file.   Jimmy Blankenship is a 70 y.o. male.  70 yo M with a chief complaints of diarrhea.  Going on for a couple days now.  Patient has a history of gastric cancer and is undergoing therapy for this.  Last treated on Wednesday of last week.  No fevers no blood in the stool no dark stool.  Denies any abdominal pain.  Feeling very weak and having trouble getting up and moving around so came to the ED for evaluation.  The history is provided by the patient and a relative.  Illness Severity:  Moderate Onset quality:  Gradual Duration:  2 days Timing:  Constant Progression:  Worsening Chronicity:  New Associated symptoms: diarrhea   Associated symptoms: no abdominal pain, no chest pain, no congestion, no fever, no headaches, no myalgias, no nausea, no rash, no shortness of breath and no vomiting        Past Medical History:  Diagnosis Date  . 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[place]) 08/22/2020  . CKD (chronic kidney disease)   . Coronary Artery Disease s/p CABG    hx of PCI in 2000s in [place] // s/p NSTEMI in 7/21 >> CABG  . Diabetes mellitus 2   . Goals of care, counseling/discussion 08/22/2020  . Heart failure with reduced ejection fraction    Ischemic CM // Echo 7/21 - EF 35 // Intraop TEE 7/21: EF 40-45 // Echocardiogram 9/21: apical AK, EF 40-45, Gr 2 DD, trace AI, mild MR, mild LAE, normal RVSF, no pericardial effusion  . High cholesterol   . Hypertension   . Iron deficiency anemia    Heme + stools (during admit for NSTEMI in 7/21)  . 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 ([facility]) 08/22/2020  . Palpitations    Event monitor 10/21: NSR, rare PVCs, rare 4 beats NSVT, rare brief non-sustained SVT    Patient Active Problem List   Diagnosis Date Noted  . 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[facility]) 08/22/2020  . Metastasis from gastric  cancer ([facility]) 08/22/2020  . Goals of care, counseling/discussion 08/22/2020  . Iron deficiency anemia   . Benign neoplasm of transverse colon   . Benign neoplasm of sigmoid colon   . Upper GI bleeding 08/13/2020  . DOE (dyspnea on exertion) 08/12/2020  . S/P CABG x 3 03/24/2020  . Chest pain 03/18/2020  . Nonspecific chest pain 03/16/2020  . CAD (coronary artery disease) 03/16/2020  . DM2 (diabetes mellitus, type 2) ([facility]) 03/16/2020  . HTN (hypertension) 03/16/2020  . HLD (hyperlipidemia) 03/16/2020  . AKI (acute kidney injury) ([facility]) 03/16/2020    Past Surgical History:  Procedure Laterality Date  . BIOPSY  08/15/2020   Procedure: BIOPSY;  Surgeon: Ladene Artist, MD;  [facility] END[facility]COPY;  Service: Gastroenterology;;  . COLONOSCOPY     around 2013. Normal per patient. [place]  . COLONOSCOPY N/A 08/15/2020   Procedure: COLONOSCOPY;  Surgeon: Ladene Artist, MD;  Location: [facility];  Service: Gastroenterology;  Laterality: N/A;  . CORONARY ANGIOPLASTY WITH STENT PLACEMENT    . CORONARY ARTERY BYPASS GRAFT N/A 03/24/2020   Procedure: CORONARY ARTERY BYPASS GRAFTING (CABG), ON PUMP, TIMES THREE, USING LEFT INTERNAL MAMMARY ARTERY AND RIGHT ENDOSCOPICALLY HARVESTED GREATER SAPHENOUS VEIN;  Surgeon: Ivin Poot, MD;  Location: [facility];  Service: Open Heart Surgery;  Laterality: N/A;  .  ESOPHAGOGASTRODUODENOSCOPY N/A 08/15/2020   Procedure: ESOPHAGOGASTRODUODENOSCOPY (EGD);  Surgeon: Ladene Artist, MD;  Location: [facility];  Service: Gastroenterology;  Laterality: N/A;  . IR IMAGING GUIDED PORT INSERTION  09/16/2020  . LEFT HEART CATH AND CORONARY ANGIOGRAPHY N/A 03/17/2020   Procedure: LEFT HEART CATH AND CORONARY ANGIOGRAPHY;  Surgeon: Belva Crome, MD;  Location: [facility];  Service: Cardiovascular;  Laterality: N/A;  . POLYPECTOMY  08/15/2020   Procedure: POLYPECTOMY;  Surgeon: Ladene Artist, MD;  Location[facility] ENDOSCOPY;  Service: Gastroenterology;;  . TEE  WITHOUT CARDIOVERSION N/A 03/24/2020   Procedure: TRANSESOPHAGEAL ECHOCARDIOGRAM (TEE);  Surgeon: [name], Collier Salina, MD;  Location: [facility];  Service: Open Heart Surgery;  Laterality: N/A;       Family History  Problem Relation Age of Onset  . Atrial fibrillation Mother   . Congestive Heart Failure Mother   . Cancer Neg Hx     Social History   Tobacco Use  . Smoking status: Former Smoker    Packs/day: 1.00    Years: 30.00    Pack years: 30.00    Quit date: 2006    Years since quitting: 16.4  . Smokeless tobacco: Never Used  . Tobacco comment: quit 2008  Vaping Use  . Vaping Use: Never used  Substance Use Topics  . Alcohol use: Never  . Drug use: Never    Home Medications Prior to Admission medications   Medication Sig Start Date End Date Taking? Authorizing Provider  atorvastatin (LIPITOR) 80 MG tablet Take 1 tablet (80 mg total) by mouth daily. 04/15/20   Richardson Dopp T, PA-C  canagliflozin (INVOKANA) 100 MG TABS tablet Take 100 mg by mouth daily. 01/03/21 04/03/21  [provider]  clopidogrel (PLAVIX) 75 MG tablet Take 75 mg by mouth daily.    [provider]  Cyanocobalamin (VITAMIN B-12 PO) Take 1 tablet by mouth daily.    [provider]  CYANOCOBALAMIN SL Take by mouth.    [provider]  cyclobenzaprine (FEXMID) 7.5 MG tablet Take 7.5 mg by mouth at bedtime. 12/28/20 03/28/21  [provider]  dexamethasone (DECADRON) 4 MG tablet Take 2 tablets (8 mg total) by mouth daily. Start the day after chemotherapy for 2 days. 02/01/21   Volanda Napoleon, MD  feeding supplement (ENSURE ENLIVE / ENSURE PLUS) LIQD Take 237 mLs by mouth 2 (two) times daily between meals. 08/16/20   Regalado, Belkys A, MD  gabapentin (NEURONTIN) 300 MG capsule Take 1 capsule (300 mg total) by mouth 2 (two) times daily. 12/07/20   Volanda Napoleon, MD  glimepiride (AMARYL) 2 MG tablet Take 1 tablet (2 mg total) by mouth 2 (two) times daily. 12/21/20   Volanda Napoleon, MD  lidocaine-prilocaine (EMLA) cream Apply to affected area once 02/01/21   Volanda Napoleon, MD  LORazepam (ATIVAN) 0.5 MG tablet Take 1 tablet (0.5 mg total) by mouth every 6 (six) hours as needed (Nausea or vomiting). 02/01/21   Volanda Napoleon, MD  metFORMIN (GLUCOPHAGE) 1000 MG tablet Take 1,000 mg by mouth 2 (two) times daily with a meal.    [provider]  metoprolol tartrate (LOPRESSOR) 50 MG tablet TAKE ONE TABLET BY MOUTH TWICE A DAY 11/16/20   Nahser, Wonda Cheng, MD  ondansetron (ZOFRAN) 8 MG tablet Take 1 tablet (8 mg total) by mouth 2 (two) times daily as needed for refractory nausea / vomiting. Start on day 3 after chemo. 02/01/21   Volanda Napoleon,  MD  pantoprazole (PROTONIX) 40 MG tablet Take 1 tablet (40 mg total) by mouth 2 (two) times daily. 11/15/20   Volanda Napoleon, MD  prochlorperazine (COMPAZINE) 10 MG tablet Take 1 tablet (10 mg total) by mouth every 6 (six) hours as needed (Nausea or vomiting). 02/01/21   Volanda Napoleon, MD    Allergies    Patient has no known allergies.  Review of Systems   Review of Systems  Constitutional: Negative for chills and fever.  HENT: Negative for congestion and facial swelling.   Eyes: Negative for discharge and visual disturbance.  Respiratory: Negative for shortness of breath.   Cardiovascular: Negative for chest pain and palpitations.  Gastrointestinal: Positive for diarrhea. Negative for abdominal pain, nausea and vomiting.  Musculoskeletal: Negative for arthralgias and myalgias.  Skin: Negative for color change and rash.  Neurological: Negative for tremors, syncope and headaches.  Psychiatric/Behavioral: Negative for confusion and dysphoric mood.    Physical Exam Updated Vital Signs BP 126/78   Pulse 63   Temp 98.5 F (36.9 C) (Oral)   Resp 16   Ht 5\' 9"  (1.753 m)   Wt 64 kg   SpO2 98%   BMI 20.84 kg/m   Physical Exam Vitals and nursing note reviewed.  Constitutional:      Appearance: He is  well-developed.  HENT:     Head: Normocephalic and atraumatic.  Eyes:     Pupils: Pupils are equal, round, and reactive to light.  Neck:     Vascular: No JVD.  Cardiovascular:     Rate and Rhythm: Normal rate and regular rhythm.     Heart sounds: No murmur heard. No friction rub. No gallop.   Pulmonary:     Effort: No respiratory distress.     Breath sounds: No wheezing.  Abdominal:     General: There is no distension.     Tenderness: There is no abdominal tenderness. There is no guarding or rebound.     Comments: No obvious pain on abdominal exam  Musculoskeletal:        General: Normal range of motion.     Cervical back: Normal range of motion and neck supple.  Skin:    Coloration: Skin is not pale.     Findings: No rash.  Neurological:     Mental Status: He is alert and oriented to person, place, and time.  Psychiatric:        Behavior: Behavior normal.     ED Results / Procedures / Treatments   Labs (all labs ordered are listed, but only abnormal results are displayed) Labs Reviewed  CBC WITH DIFFERENTIAL/PLATELET - Abnormal; Notable for the following components:      Result Value   RBC 4.12 (*)    Hemoglobin 11.9 (*)    HCT 37.2 (*)    RDW 15.7 (*)    All other components within normal limits  COMPREHENSIVE METABOLIC PANEL - Abnormal; Notable for the following components:   Glucose, Bld 124 (*)    BUN 25 (*)    Calcium 8.7 (*)    Total Protein 6.1 (*)    All other components within normal limits  MAGNESIUM - Abnormal; Notable for the following components:   Magnesium 1.5 (*)    All other components within normal limits  LIPASE, BLOOD    EKG None  Radiology No results found.  Procedures Procedures   Medications Ordered in ED Medications  magnesium sulfate IVPB 2 g 50 mL (2 g Intravenous New  Bag/Given 02/07/21 0025)  ondansetron (ZOFRAN) injection 4 mg (4 mg Intravenous Given 02/06/21 2326)  sodium chloride 0.9 % bolus 1,000 mL ( Intravenous Stopped  02/07/21 0024)  morphine 2 MG/ML injection 2 mg (2 mg Intravenous Given 02/06/21 2327)    ED Course  I have reviewed the triage vital signs and the nursing notes.  Pertinent labs & imaging results that were available during my care of the patient were reviewed by me and considered in my medical decision making (see chart for details).    MDM Rules/Calculators/A&P                          70 yo M with a significant past medical history of cancer of the lesser curvature of the stomach currently getting chemotherapy.  Patient with diarrhea for couple days and weakness.    Hemoglobin is higher than baseline.  No LFT elevation. Hypomagnesia on lab work. Will replenish here.  Reassess.  Patient is feeling better on reassessment.  Will discharge the patient home.  Oncology follow-up.  12:32 AM:  I have discussed the diagnosis/risks/treatment options with the patient and family and believe the pt to be eligible for discharge home to follow-up with Oncology. We also discussed returning to the ED immediately if new or worsening sx occur. We discussed the sx which are most concerning (e.g., sudden worsening pain, fever, inability to tolerate by mouth) that necessitate immediate return. Medications administered to the patient during their visit and any new prescriptions provided to the patient are listed below.  Medications given during this visit Medications  magnesium sulfate IVPB 2 g 50 mL (2 g Intravenous New Bag/Given 02/07/21 0025)  ondansetron (ZOFRAN) injection 4 mg (4 mg Intravenous Given 02/06/21 2326)  sodium chloride 0.9 % bolus 1,000 mL ( Intravenous Stopped 02/07/21 0024)  morphine 2 MG/ML injection 2 mg (2 mg Intravenous Given 02/06/21 2327)     The patient appears reasonably screen and/or stabilized for discharge and I doubt any other medical condition or other [facility] requiring further screening, evaluation, or treatment in the ED at this time prior to discharge.   Final Clinical  Impression(s) / ED Diagnoses Final diagnoses:  Diarrhea, unspecified type    Rx / DC Orders ED Discharge Orders    None       Deno Etienne, DO 02/07/21 8185

## 2021-02-07 NOTE — Discharge Instructions (Signed)
He can take Imodium for diarrhea.  Please return for worsening symptoms or abdominal pain or fever.  Please return for dark stool or blood in his stool.

## 2021-02-14 ENCOUNTER — Other Ambulatory Visit (HOSPITAL_COMMUNITY): Payer: Self-pay | Admitting: Hematology & Oncology

## 2021-02-14 DIAGNOSIS — C799 Secondary malignant neoplasm of unspecified site: Secondary | ICD-10-CM

## 2021-02-20 ENCOUNTER — Emergency Department (HOSPITAL_COMMUNITY): Payer: Medicare Other

## 2021-02-20 ENCOUNTER — Encounter (HOSPITAL_COMMUNITY): Payer: Self-pay

## 2021-02-20 ENCOUNTER — Emergency Department (HOSPITAL_COMMUNITY)
Admission: EM | Admit: 2021-02-20 | Discharge: 2021-02-20 | Disposition: A | Discharge status: Home / Self Care | Payer: Medicare Other | Attending: Emergency Medicine | Admitting: Emergency Medicine

## 2021-02-20 ENCOUNTER — Other Ambulatory Visit: Payer: Self-pay

## 2021-02-20 ENCOUNTER — Encounter: Payer: Self-pay | Admitting: Hematology & Oncology

## 2021-02-20 ENCOUNTER — Telehealth: Payer: Self-pay | Admitting: *Deleted

## 2021-02-20 DIAGNOSIS — E1122 Type 2 diabetes mellitus with diabetic chronic kidney disease: Secondary | ICD-10-CM | POA: Insufficient documentation

## 2021-02-20 DIAGNOSIS — Z87891 Personal history of nicotine dependence: Secondary | ICD-10-CM | POA: Insufficient documentation

## 2021-02-20 DIAGNOSIS — R339 Retention of urine, unspecified: Secondary | ICD-10-CM

## 2021-02-20 DIAGNOSIS — Z7902 Long term (current) use of antithrombotics/antiplatelets: Secondary | ICD-10-CM | POA: Diagnosis not present

## 2021-02-20 DIAGNOSIS — K5900 Constipation, unspecified: Secondary | ICD-10-CM | POA: Diagnosis not present

## 2021-02-20 DIAGNOSIS — I2510 Atherosclerotic heart disease of native coronary artery without angina pectoris: Secondary | ICD-10-CM | POA: Diagnosis not present

## 2021-02-20 DIAGNOSIS — Z7984 Long term (current) use of oral hypoglycemic drugs: Secondary | ICD-10-CM | POA: Diagnosis not present

## 2021-02-20 DIAGNOSIS — N320 Bladder-neck obstruction: Secondary | ICD-10-CM | POA: Insufficient documentation

## 2021-02-20 DIAGNOSIS — Z79899 Other long term (current) drug therapy: Secondary | ICD-10-CM | POA: Insufficient documentation

## 2021-02-20 DIAGNOSIS — Z85028 Personal history of other malignant neoplasm of stomach: Secondary | ICD-10-CM | POA: Insufficient documentation

## 2021-02-20 DIAGNOSIS — Z951 Presence of aortocoronary bypass graft: Secondary | ICD-10-CM | POA: Insufficient documentation

## 2021-02-20 DIAGNOSIS — I129 Hypertensive chronic kidney disease with stage 1 through stage 4 chronic kidney disease, or unspecified chronic kidney disease: Secondary | ICD-10-CM | POA: Insufficient documentation

## 2021-02-20 DIAGNOSIS — N189 Chronic kidney disease, unspecified: Secondary | ICD-10-CM | POA: Diagnosis not present

## 2021-02-20 DIAGNOSIS — K5641 Fecal impaction: Secondary | ICD-10-CM

## 2021-02-20 LAB — URINALYSIS, ROUTINE W REFLEX MICROSCOPIC
Bilirubin Urine: NEGATIVE
Glucose, UA: 50 mg/dL — AB
Hgb urine dipstick: NEGATIVE
Ketones, ur: NEGATIVE mg/dL
Leukocytes,Ua: NEGATIVE
Nitrite: NEGATIVE
Protein, ur: NEGATIVE mg/dL
Specific Gravity, Urine: 1.033 — ABNORMAL HIGH (ref 1.005–1.030)
pH: 5 (ref 5.0–8.0)

## 2021-02-20 LAB — CBC WITH DIFFERENTIAL/PLATELET
Abs Immature Granulocytes: 0.02 10*3/uL (ref 0.00–0.07)
Basophils Absolute: 0 10*3/uL (ref 0.0–0.1)
Basophils Relative: 1 %
Eosinophils Absolute: 0.1 10*3/uL (ref 0.0–0.5)
Eosinophils Relative: 1 %
HCT: 35.6 % — ABNORMAL LOW (ref 39.0–52.0)
Hemoglobin: 11.2 g/dL — ABNORMAL LOW (ref 13.0–17.0)
Immature Granulocytes: 0 %
Lymphocytes Relative: 11 %
Lymphs Abs: 0.5 10*3/uL — ABNORMAL LOW (ref 0.7–4.0)
MCH: 28.7 pg (ref 26.0–34.0)
MCHC: 31.5 g/dL (ref 30.0–36.0)
MCV: 91.3 fL (ref 80.0–100.0)
Monocytes Absolute: 0.4 10*3/uL (ref 0.1–1.0)
Monocytes Relative: 8 %
Neutro Abs: 3.8 10*3/uL (ref 1.7–7.7)
Neutrophils Relative %: 79 %
Platelets: 157 10*3/uL (ref 150–400)
RBC: 3.9 MIL/uL — ABNORMAL LOW (ref 4.22–5.81)
RDW: 15.6 % — ABNORMAL HIGH (ref 11.5–15.5)
WBC: 4.8 10*3/uL (ref 4.0–10.5)
nRBC: 0 % (ref 0.0–0.2)

## 2021-02-20 LAB — COMPREHENSIVE METABOLIC PANEL
ALT: 15 U/L (ref 0–44)
AST: 20 U/L (ref 15–41)
Albumin: 3.6 g/dL (ref 3.5–5.0)
Alkaline Phosphatase: 78 U/L (ref 38–126)
Anion gap: 7 (ref 5–15)
BUN: 13 mg/dL (ref 8–23)
CO2: 25 mmol/L (ref 22–32)
Calcium: 9.4 mg/dL (ref 8.9–10.3)
Chloride: 107 mmol/L (ref 98–111)
Creatinine, Ser: 0.94 mg/dL (ref 0.61–1.24)
GFR, Estimated: >60 mL/min (ref >60)
Glucose, Bld: 222 mg/dL — ABNORMAL HIGH (ref 70–99)
Potassium: 3.9 mmol/L (ref 3.5–5.1)
Sodium: 139 mmol/L (ref 135–145)
Total Bilirubin: 0.6 mg/dL (ref 0.3–1.2)
Total Protein: 6.9 g/dL (ref 6.5–8.1)

## 2021-02-20 LAB — CBG MONITORING, ED: Glucose-Capillary: 103 mg/dL — ABNORMAL HIGH (ref 70–99)

## 2021-02-20 LAB — LIPASE, BLOOD: Lipase: 39 U/L (ref 11–51)

## 2021-02-20 IMAGING — CR DG ABDOMEN ACUTE W/ 1V CHEST
4 series · 4 of 4 positions shown · non-contrast
Comparison: [DATE]

CLINICAL DATA: Abdominal pain and constipation for 2 weeks

EXAM:
DG ABDOMEN ACUTE WITH 1 VIEW CHEST

[w chest pa]
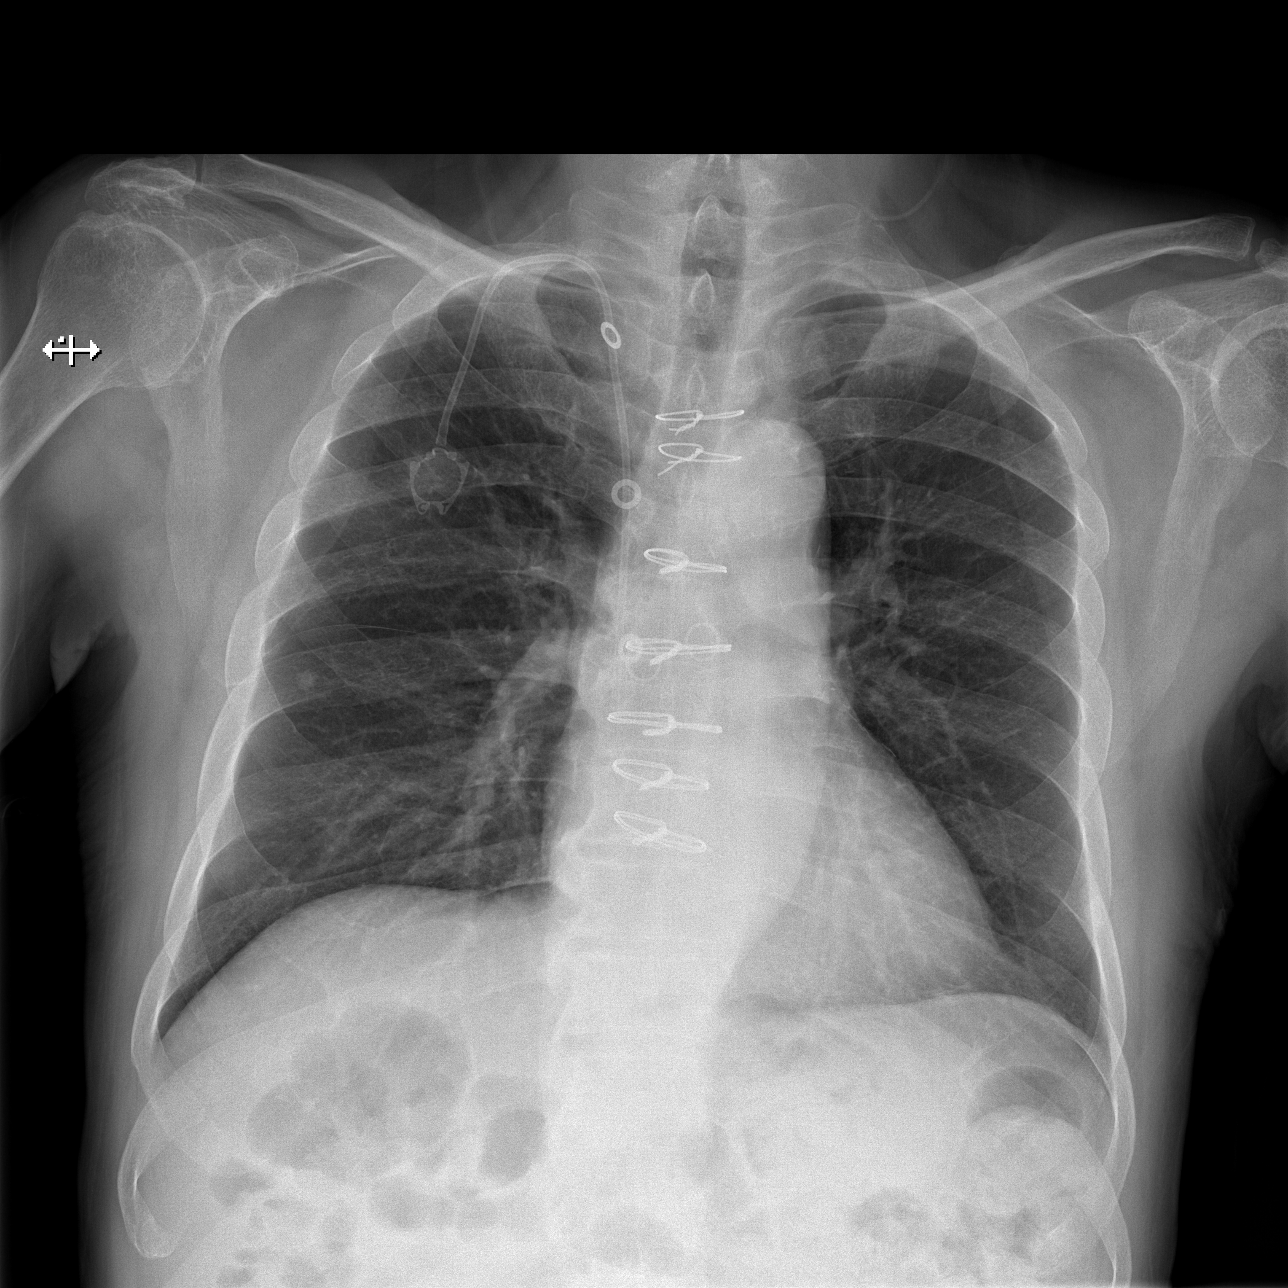

[w abdomen upright]
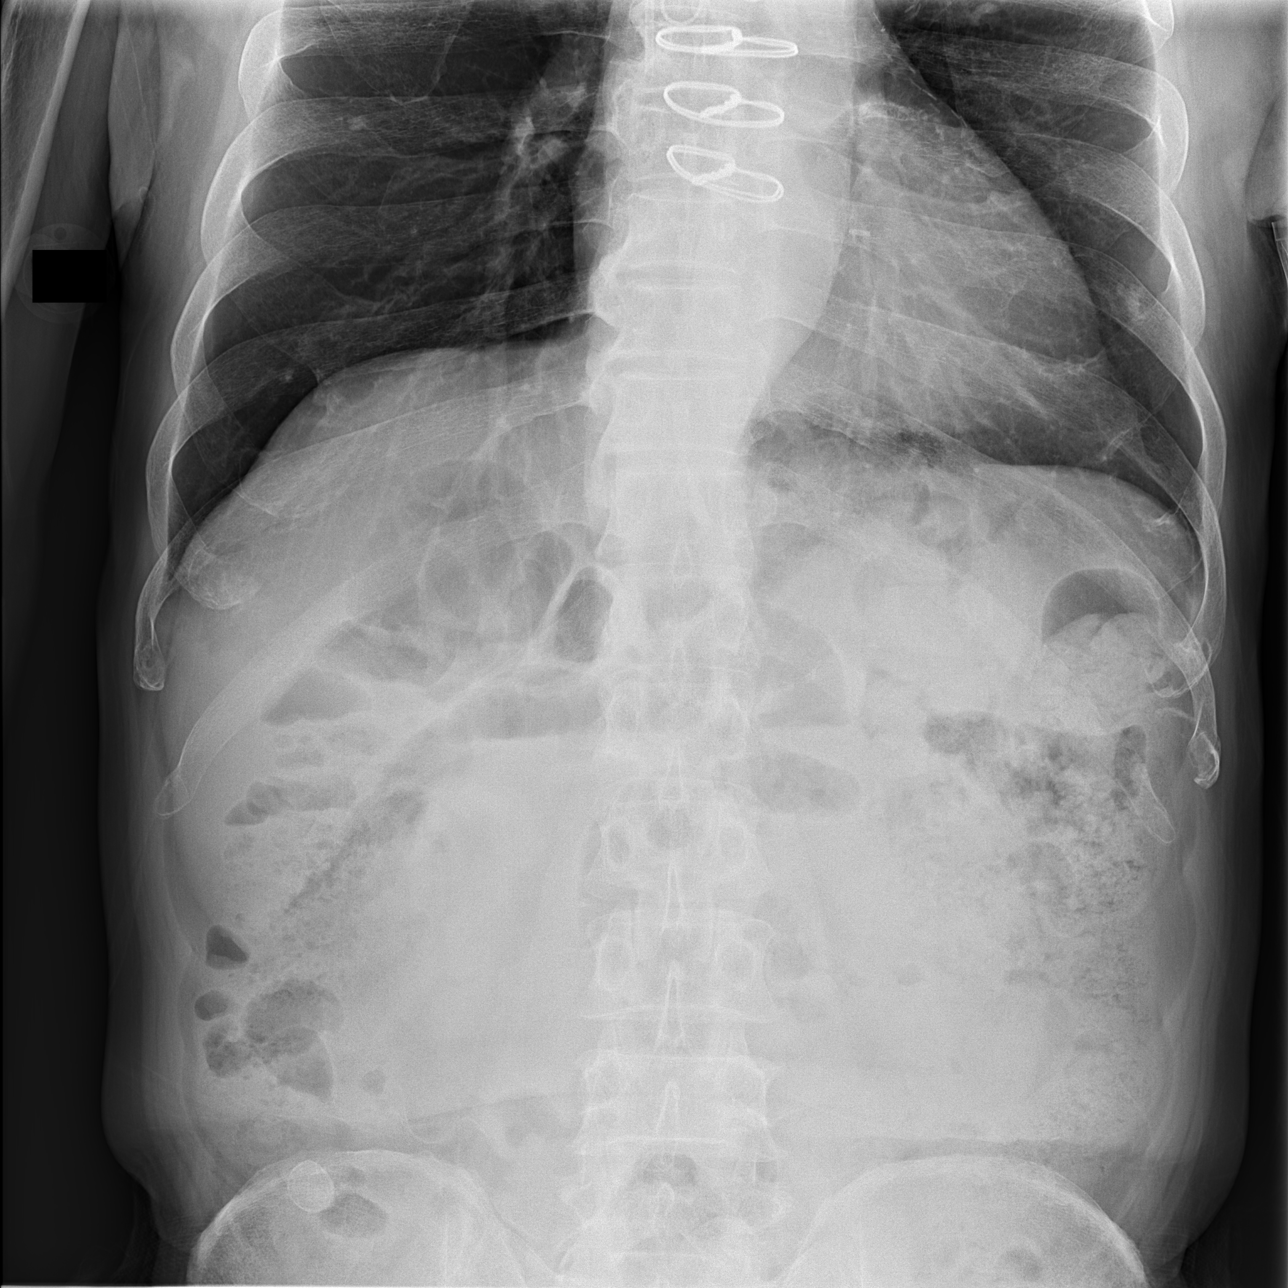

[t abdomen supine (1 of 2)]
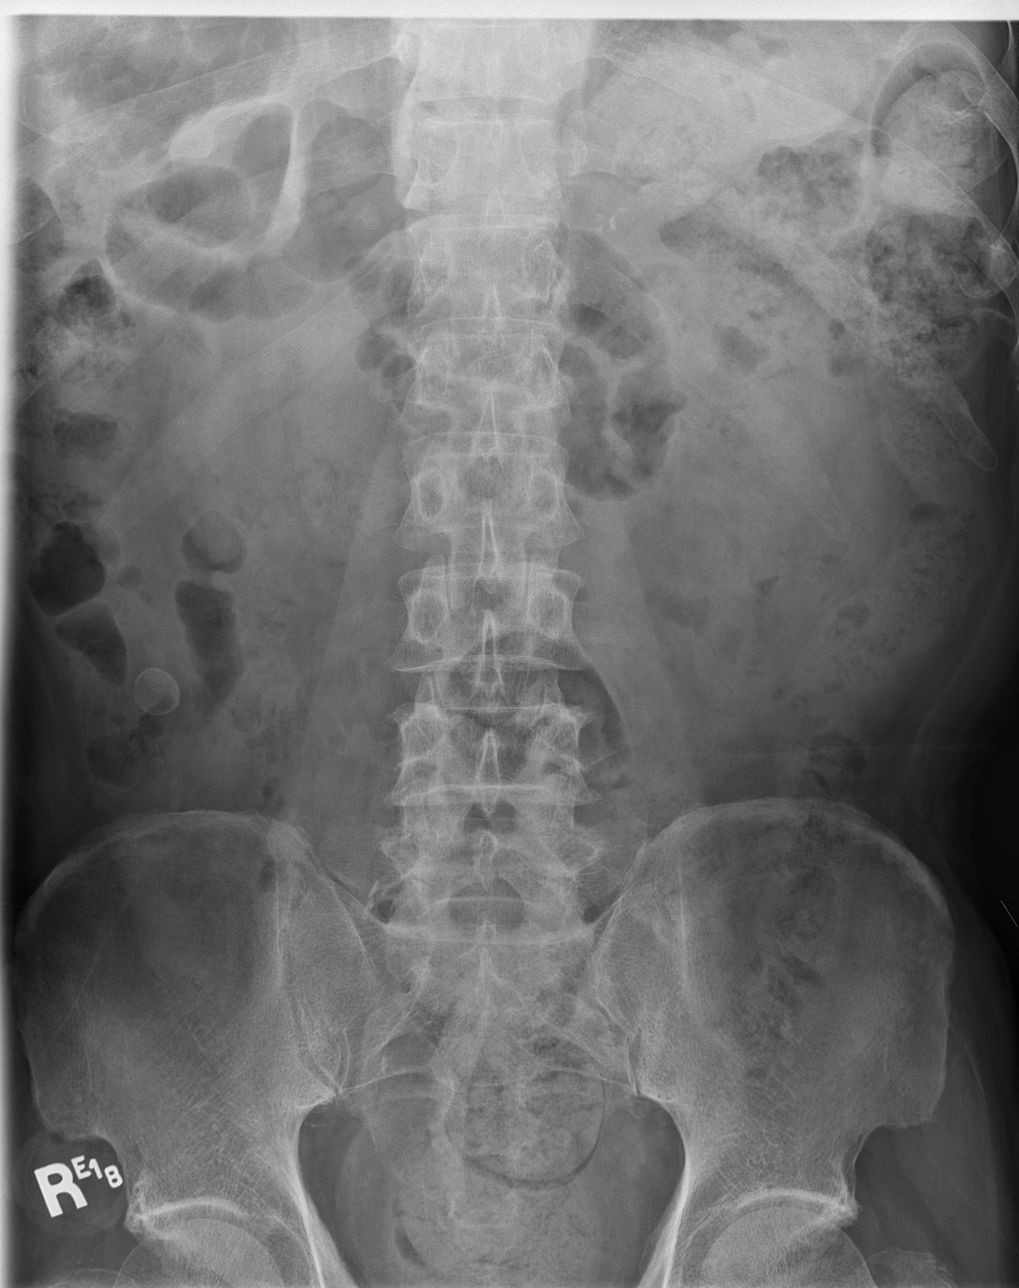

[t abdomen supine (2 of 2)]
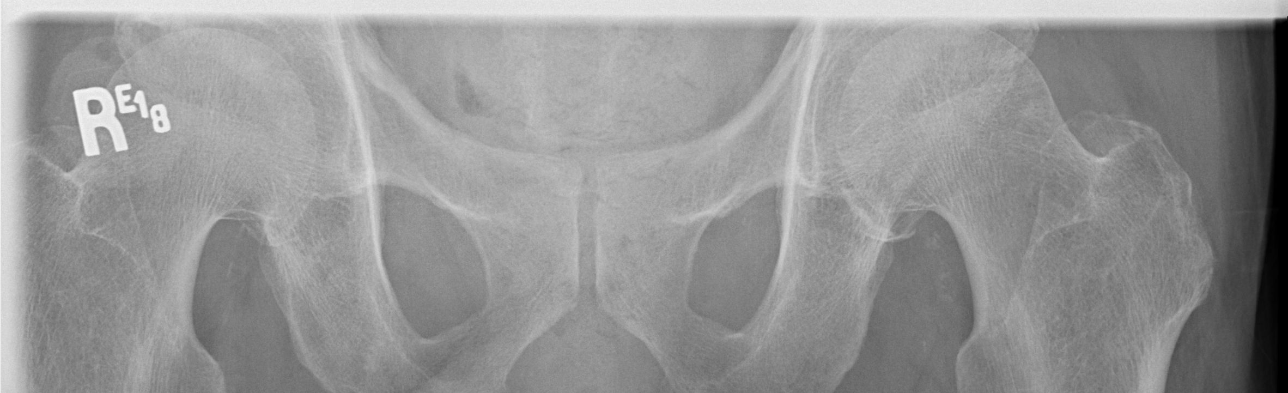

[4 of 4 positions shown; findings below may reference images not displayed]

FINDINGS: Cardiac shadow is within normal limits. Postsurgical changes are
noted. Right-sided chest wall port is noted in satisfactory
position. Small rounded calcification is noted overlying the
posterior aspect of the right seventh rib consistent with a small
bone island seen on prior CT examination. No focal infiltrate is
seen.

Scattered large and small bowel gas is noted. Mild retained fecal
material is noted without obstructive change. No abnormal mass or
abnormal calcifications are identified. No acute bony abnormality is
seen. No free air is noted.
IMPRESSION: Retained fecal material consistent with a degree of constipation. No
obstructive changes are noted.

No other focal abnormality is noted.

## 2021-02-20 IMAGING — MR MR THORACIC SPINE WO/W CM
9 of 17 series · 17 of 48 positions shown · IV contrast (gadavist)
Comparison: None.

CLINICAL DATA: History of gastric cancer with concern for
metastatic disease.

EXAM:
MRI THORACIC WITHOUT AND WITH CONTRAST
TECHNIQUE: Multiplanar and multiecho pulse sequences of the thoracic spine were
obtained without and with intravenous contrast.
CONTRAST:  5.5mL GADAVIST GADOBUTROL 1 MMOL/ML IV SOLN

[Series 16: T1 · sagittal · 4.0mm · 1.72mm/px · 1 of 8 slices shown (1 of 3)]
[im 1/8]
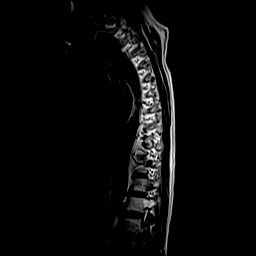

[Series 17: STIR · sagittal · 3.0mm · 1.00mm/px · 1 of 15 slices shown]
[im 1/15]
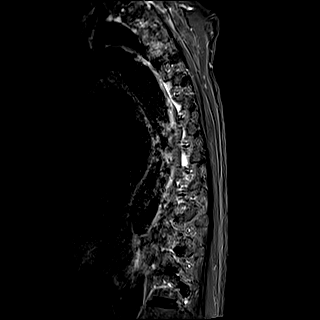

[Series 18: T1 · sagittal · 3.0mm · 1.00mm/px · 1 of 15 slices shown (2 of 3)]
[im 1/15]
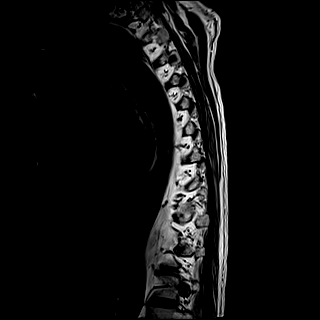

[Series 22: T2 · axial · 4.0mm · 0.78mm/px · z∈[-353,-98]mm · 3 of 39 slices shown]
[im 1/39]
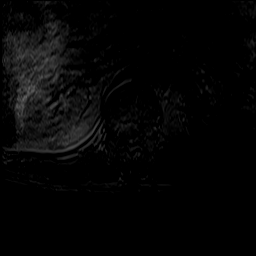
[im 20/39]
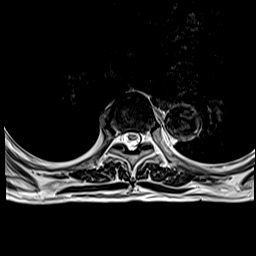
[im 39/39]
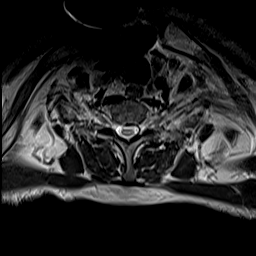

[Series 23: t2_me2d_tra t spine · axial · 4.0mm · 0.39mm/px · z∈[-353,-98]mm · 3 of 39 slices shown]
[im 1/39]
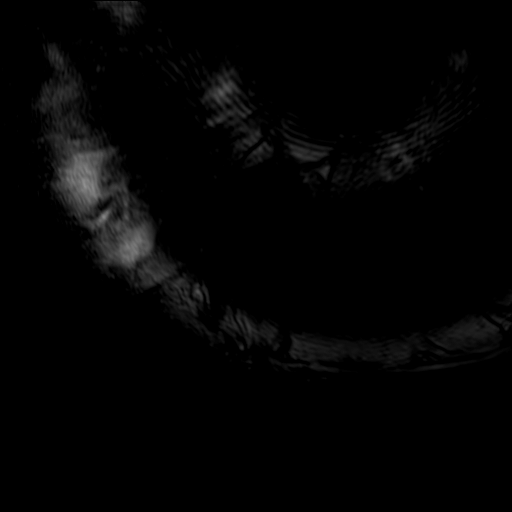
[im 20/39]
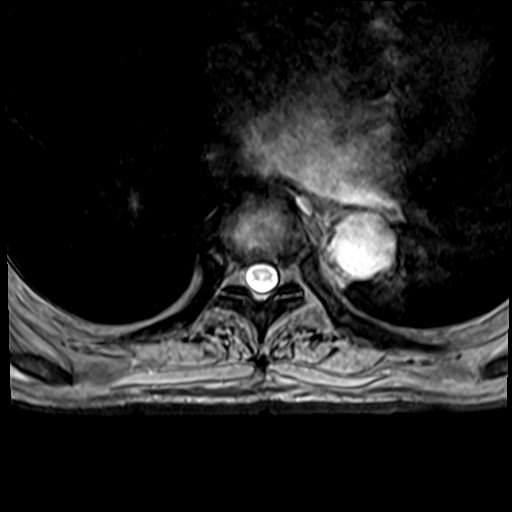
[im 39/39]
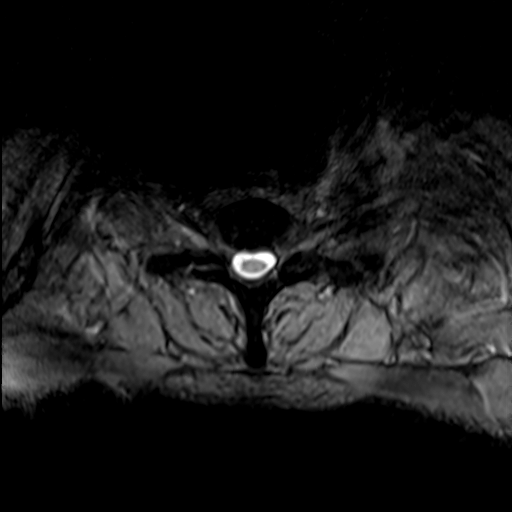

[Series 24: T1 · axial · 4.0mm · 0.39mm/px · z∈[-353,-98]mm · 3 of 39 slices shown (3 of 3)]
[im 1/39]
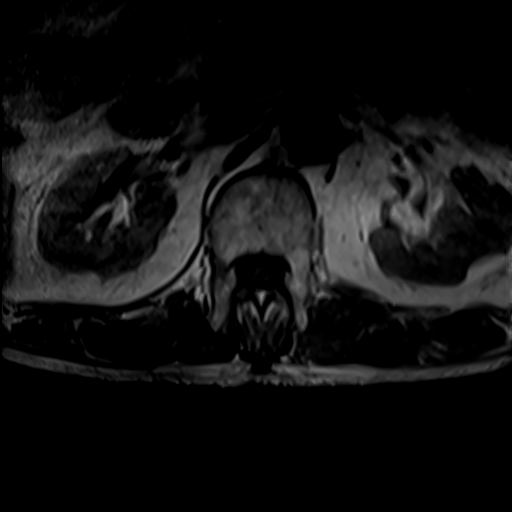
[im 20/39]
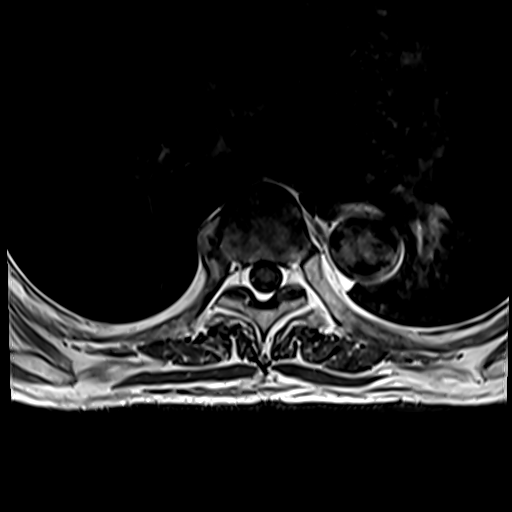
[im 39/39]
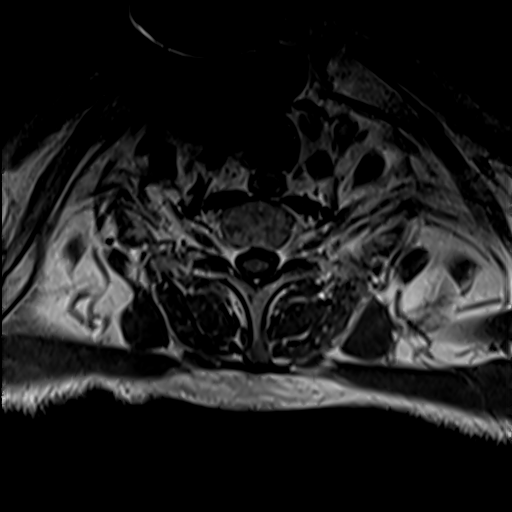

[Series 27: T2 post-contrast · sagittal · 3.0mm · 0.83mm/px · 1 of 15 slices shown]
[im 1/15]
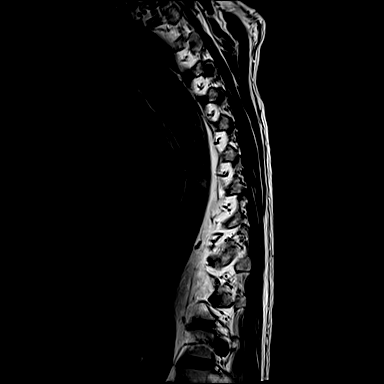

[Series 28: T1 fat-sat post-contrast · sagittal · 3.0mm · 1.00mm/px · 1 of 15 slices shown]
[im 1/15]
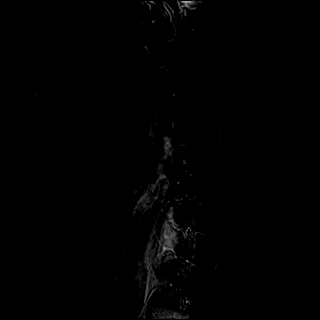

[Series 29: T1 post-contrast · axial · 4.0mm · 0.39mm/px · z∈[-353,-98]mm · 3 of 39 slices shown]
[im 1/39]
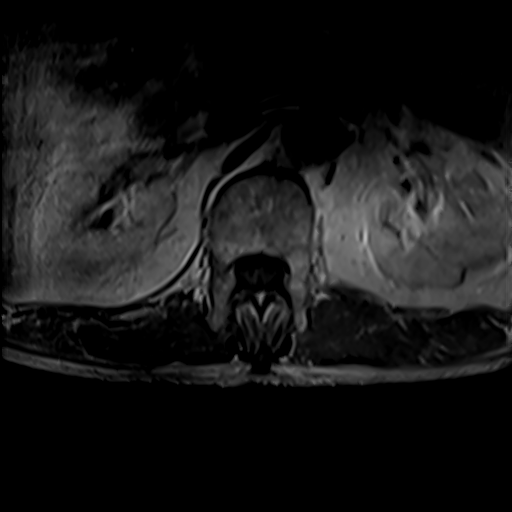
[im 20/39]
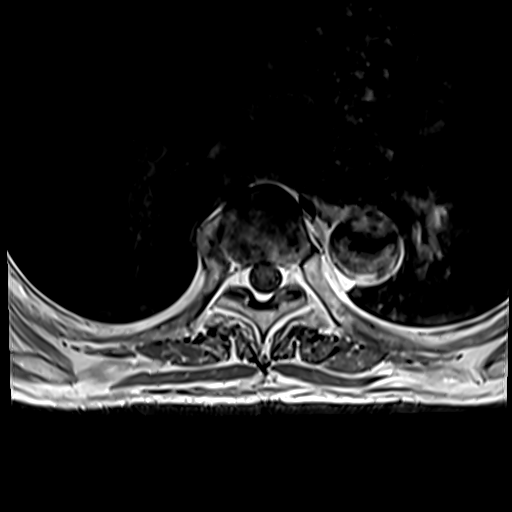
[im 39/39]
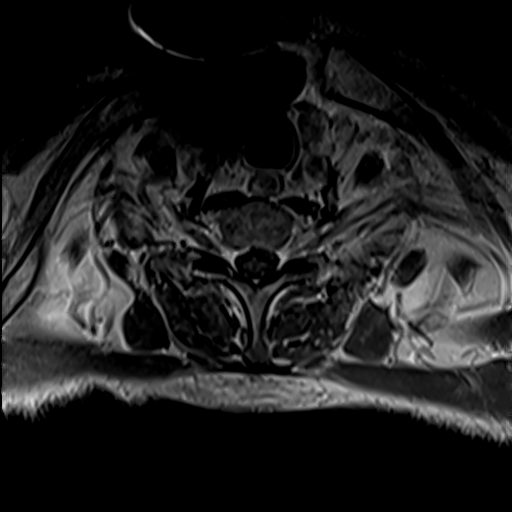

[17 of 48 positions shown; findings below may reference images not displayed]

FINDINGS: Alignment:  Normal

Vertebrae: No fracture, evidence of discitis, or bone lesion. No
abnormal contrast enhancement.

Cord:  Normal

Paraspinal and other soft tissues: Negative

Disc levels:

No spinal canal or neural foraminal stenosis.
IMPRESSION: No evidence of metastatic disease to the thoracic spine.

## 2021-02-20 IMAGING — CT CT ABD-PELV W/ CM
2 of 6 series · 15 of 46 positions shown, 17 images · IV contrast (omnipaque)
Comparison: CT [DATE], MRI [DATE]

CLINICAL DATA: Constipation for 2 weeks, abdominal pain. History of
metastatic gastric cancer

EXAM:
CT ABDOMEN AND PELVIS WITH CONTRAST
TECHNIQUE: Multidetector CT imaging of the abdomen and pelvis was performed
using the standard protocol following bolus administration of
intravenous contrast.
CONTRAST:  100mL OMNIPAQUE IOHEXOL 300 MG/ML  SOLN

[Series 2: axial st · axial · 0.75mm/px · z∈[+981,+1381]mm · 12 of 94 slices shown, 14 images]
[im 7/94  soft-tissue]
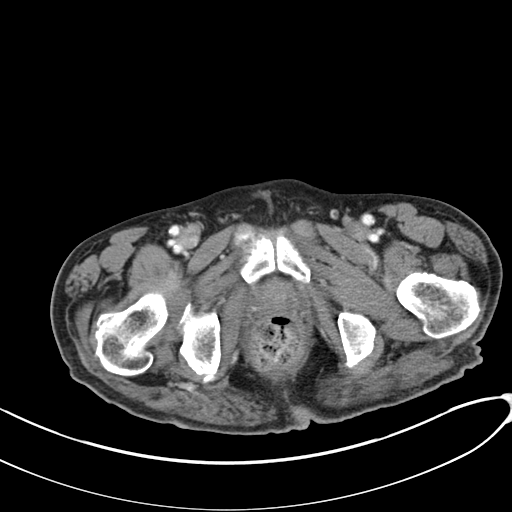
[im 7/94  bone]
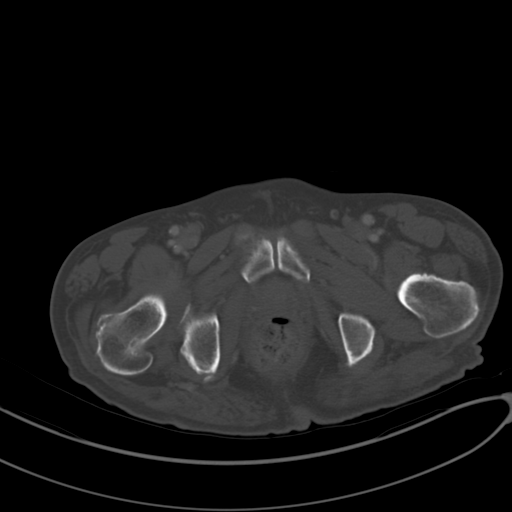
[im 14/94  soft-tissue]
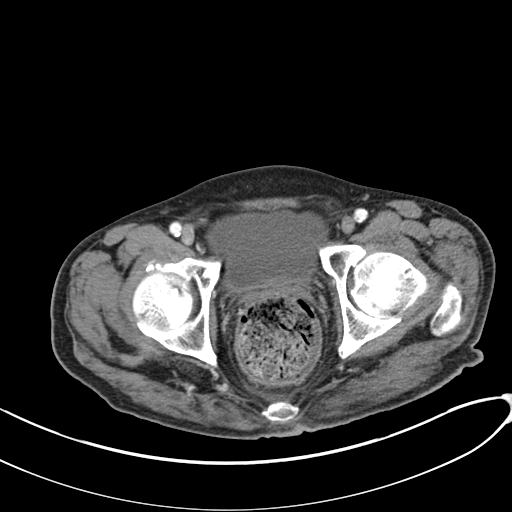
[im 20/94  soft-tissue]
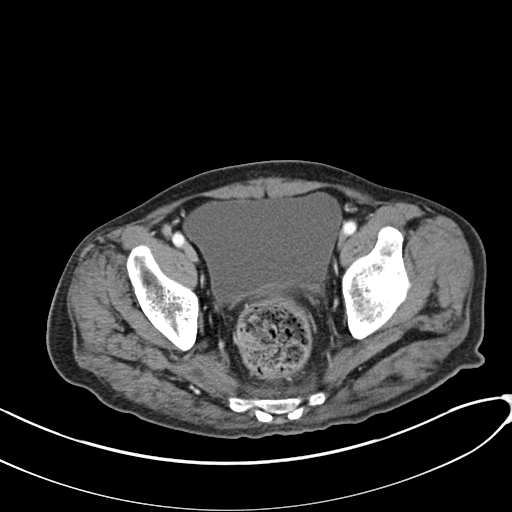
[im 27/94  soft-tissue]
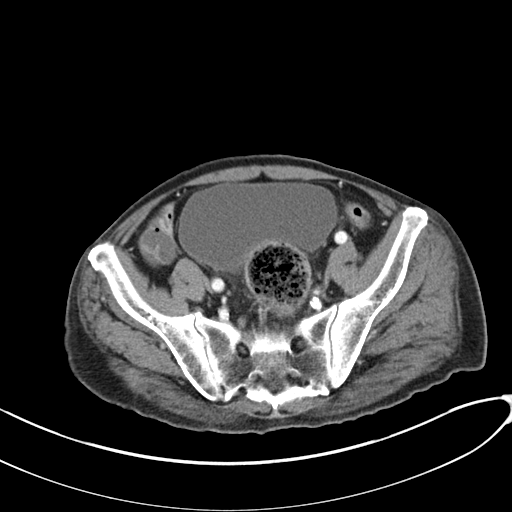
[im 34/94  soft-tissue]
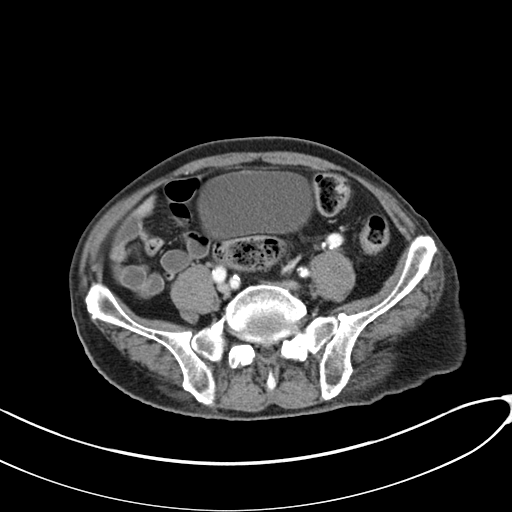
[im 40/94  soft-tissue]
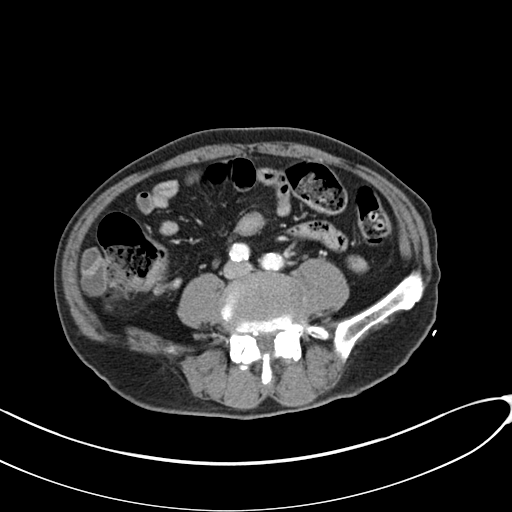
[im 54/94  soft-tissue]
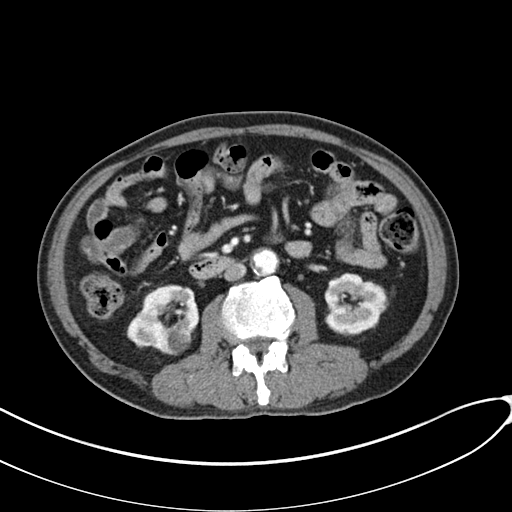
[im 60/94  soft-tissue]
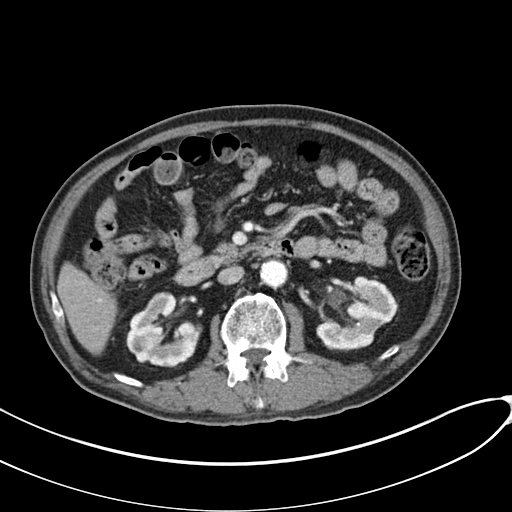
[im 67/94  soft-tissue]
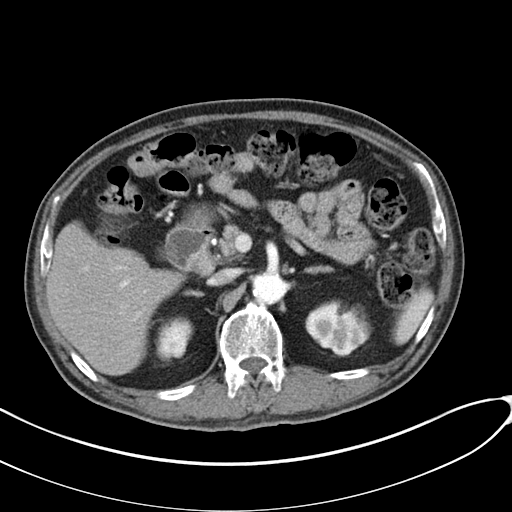
[im 67/94  bone]
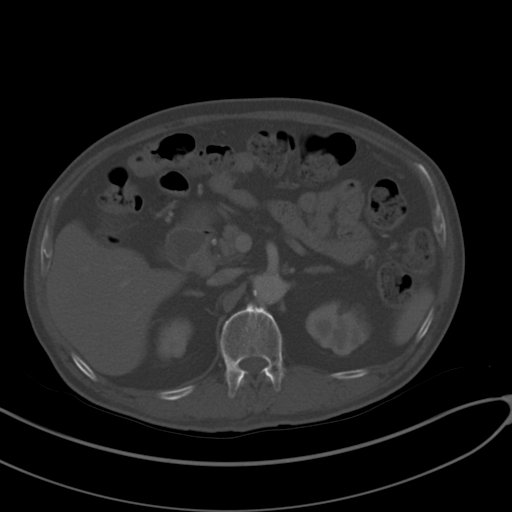
[im 74/94  soft-tissue]
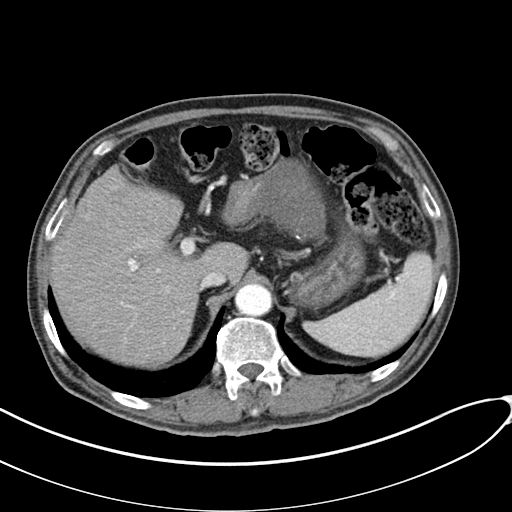
[im 80/94  soft-tissue]
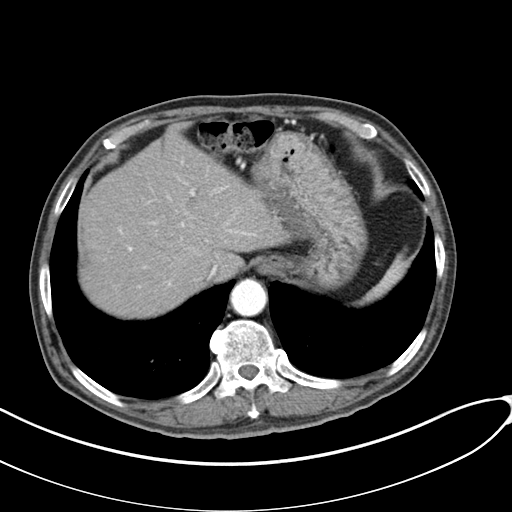
[im 87/94  soft-tissue]
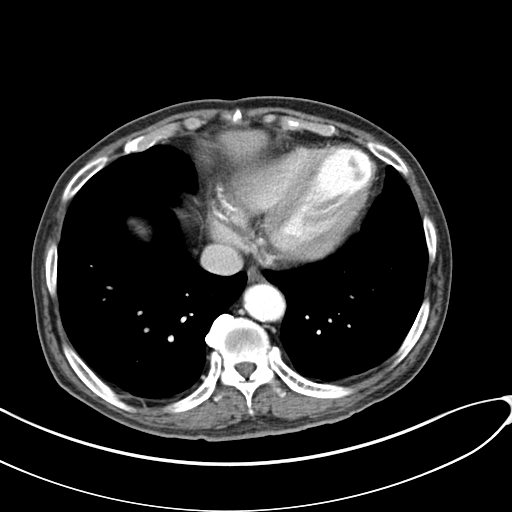

[Series 5: coronal st · coronal · 0.70mm/px · 3 of 129 slices shown]
[im 43/129  soft-tissue]
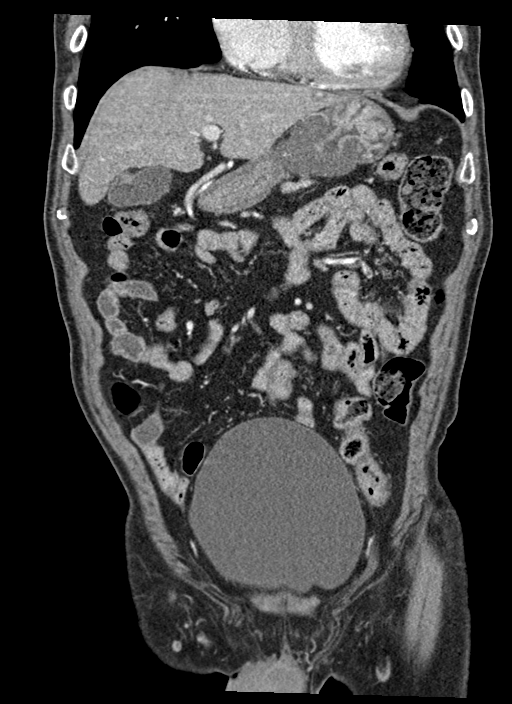
[im 57/129  soft-tissue]
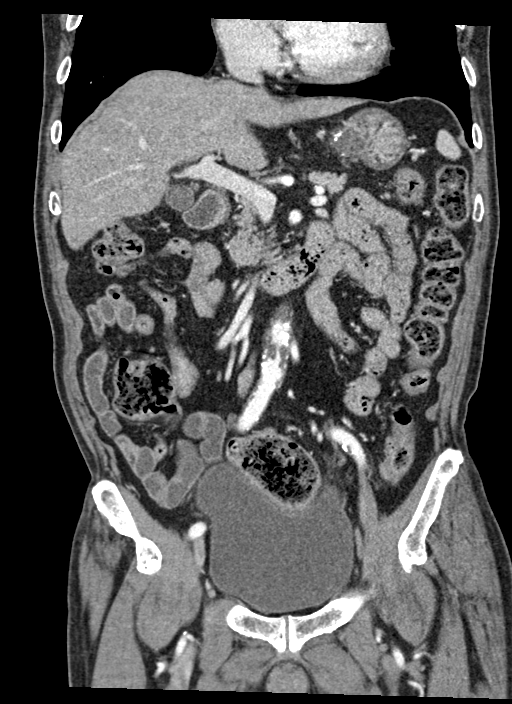
[im 72/129  soft-tissue]
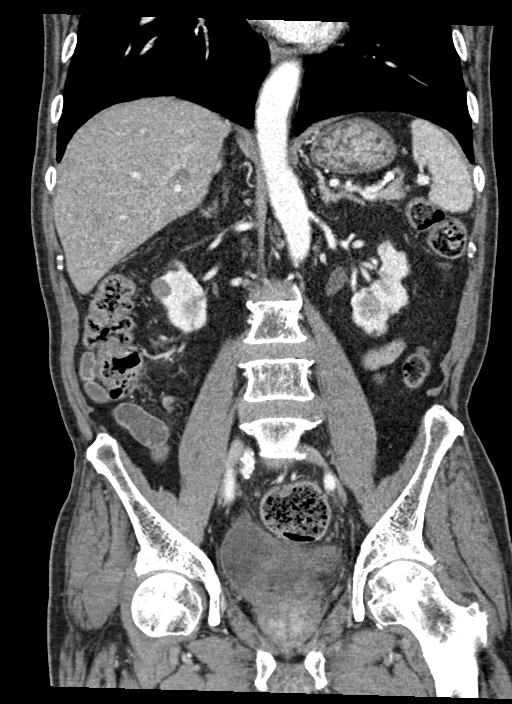

[15 of 46 positions shown; findings below may reference images not displayed]

FINDINGS: Lower chest: Included lung fields are clear. Heart size within
normal limits.

Hepatobiliary: Redemonstrated multiple subtle low-density lesions
within the liver compatible with known hepatic metastatic disease.
Index lesion within the posterior right hepatic lobe, segment 6,
measures 1.1 x 1.1 cm (series 2, image 23), stable from prior. No
new hepatic lesion is identified. Unremarkable gallbladder. No
biliary dilatation.

Pancreas: Unremarkable. No pancreatic ductal dilatation or
surrounding inflammatory changes.

Spleen: Normal in size without focal abnormality.

Adrenals/Urinary Tract: Unremarkable adrenal glands. Hyperdense
exophytic lesion emanating anteriorly from the right kidney,
previously characterized as a hemorrhagic cyst on MRI. Additional
right renal cyst. Areas of bilateral renal cortical scarring. No
renal stone or hydronephrosis. Urinary bladder is moderately
distended.

Stomach/Bowel: Similar appearance of circumferential mucosal
thickening of the gastric body. Mucosal thickness of approximately
1.8 cm (series 5, image 30), unchanged from prior. No dilated loops
of small bowel. Appendix appears normal (series 2, image 61). No
evidence of bowel wall thickening, distention, or inflammatory
changes. Moderate volume of stool throughout the colon including a
large rectal stool ball measuring up to 7.0 cm in diameter.

Vascular/Lymphatic: Atherosclerotic calcifications throughout the
aortoiliac axis. No aneurysm. No abdominopelvic lymphadenopathy.

Reproductive: Stable mild prostatomegaly.

Other: Mild presacral free fluid. No abdominopelvic fluid
collection. No pneumoperitoneum. No abdominal wall hernia.

Musculoskeletal: No acute osseous findings. No suspicious bone
lesion identified.
IMPRESSION: 1. Moderate volume of stool throughout the colon including a large
rectal stool ball measuring up to 7.0 cm in diameter. Findings
suggestive of constipation and possible fecal impaction.
2. Moderate distention of the urinary bladder, possibly representing
outlet obstruction secondary to rectal stool ball.
3. Similar appearance of circumferential mucosal thickening of the
gastric body consistent with known gastric malignancy.
4. Redemonstrated multiple subtle low-density lesions within the
liver compatible with known hepatic metastatic disease.

Aortic Atherosclerosis ([OZ]-[OZ]).

## 2021-02-20 IMAGING — MR MR LUMBAR SPINE WO/W CM
7 of 10 series · 26 of 48 positions shown · IV contrast (5.5 ml gadavist)
Comparison: None.

CLINICAL DATA: History of gastric cancer. Concern for metastatic
disease.

EXAM:
MRI LUMBAR SPINE WITHOUT AND WITH CONTRAST
TECHNIQUE: Multiplanar and multiecho pulse sequences of the lumbar spine were
obtained without and with intravenous contrast.
CONTRAST:  5.5mL GADAVIST GADOBUTROL 1 MMOL/ML IV SOLN

[Series 5: T2 · sagittal · 4.0mm · 0.81mm/px · 2 of 17 slices shown (1 of 2)]
[im 1/17]
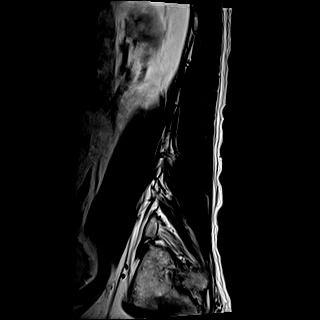
[im 17/17]
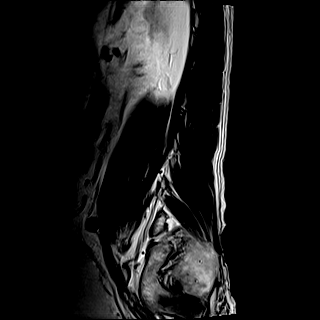

[Series 6: T1 · sagittal · 4.0mm · 0.81mm/px · 2 of 17 slices shown (1 of 2)]
[im 1/17]
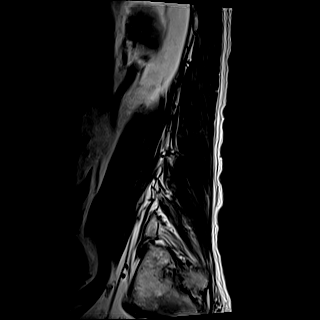
[im 17/17]
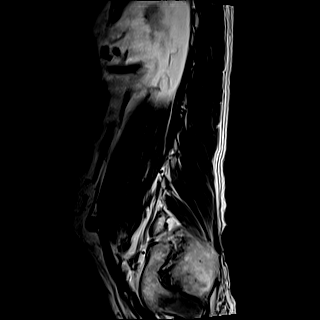

[Series 7: STIR · sagittal · 4.0mm · 0.51mm/px · 2 of 17 slices shown]
[im 1/17]
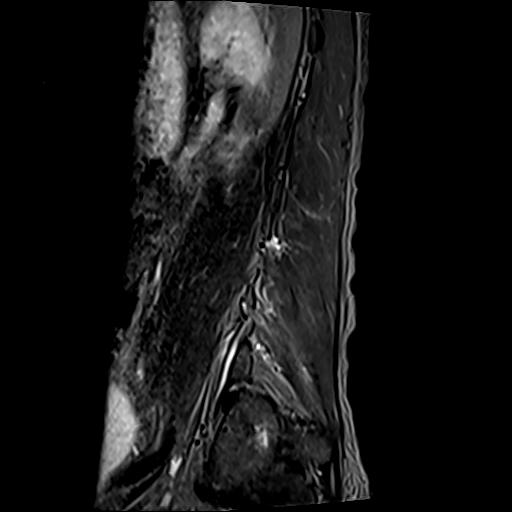
[im 17/17]
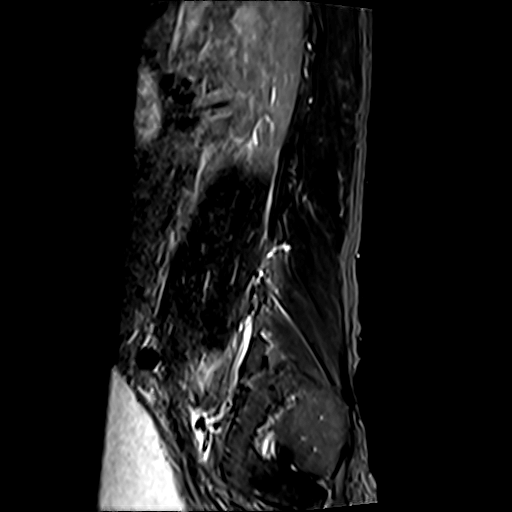

[Series 8: T2 · axial · 4.0mm · 0.62mm/px · z∈[-590,-358]mm · 7 of 48 slices shown (2 of 2)]
[im 1/48]
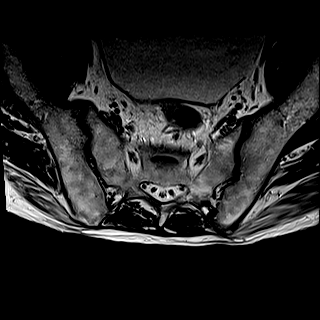
[im 8/48]
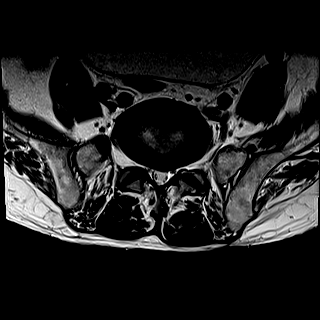
[im 16/48]
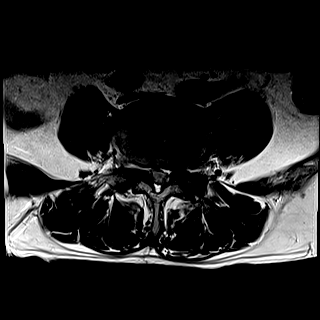
[im 24/48]
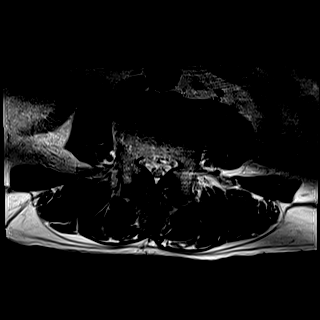
[im 32/48]
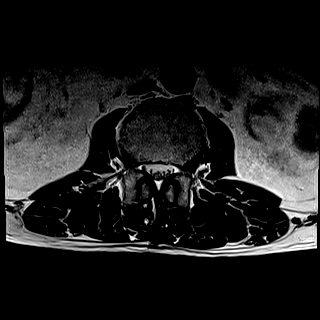
[im 40/48]
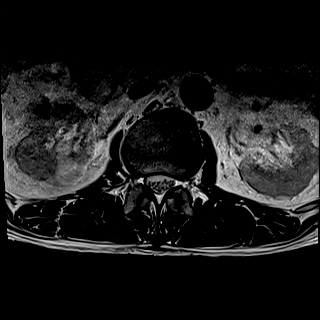
[im 48/48]
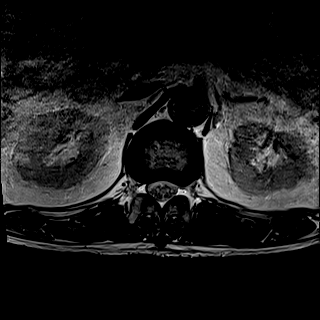

[Series 9: T1 · axial · 4.0mm · 0.39mm/px · z∈[-590,-358]mm · 7 of 48 slices shown (2 of 2)]
[im 1/48]
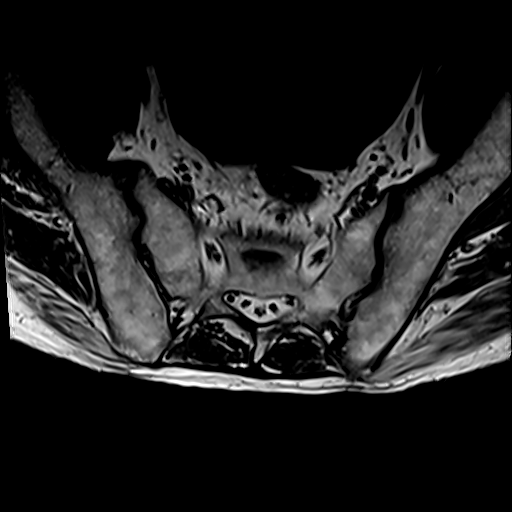
[im 8/48]
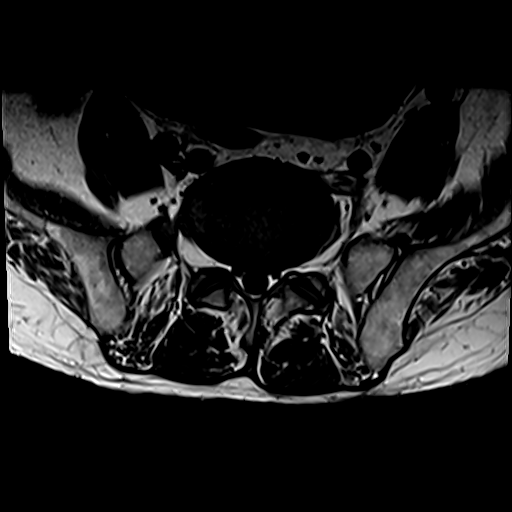
[im 16/48]
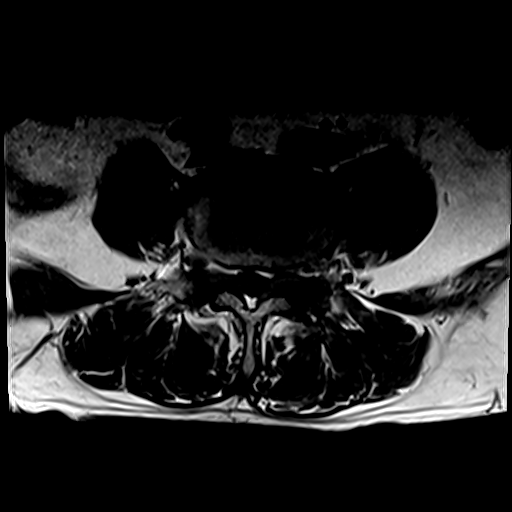
[im 24/48]
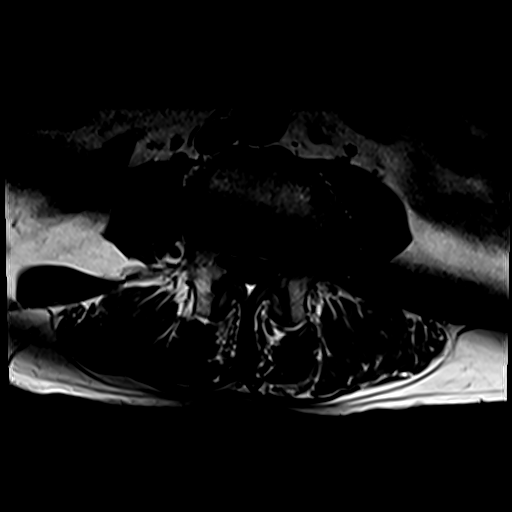
[im 32/48]
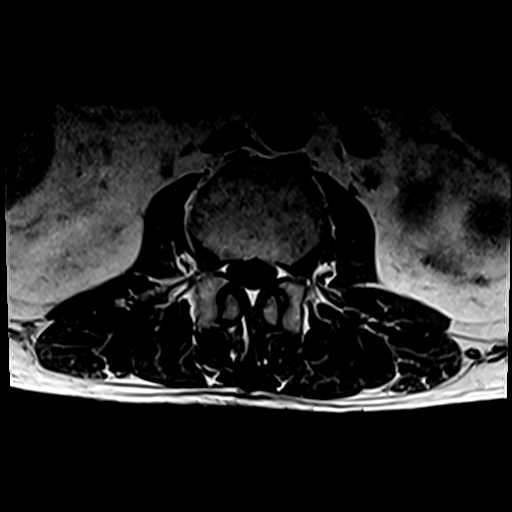
[im 40/48]
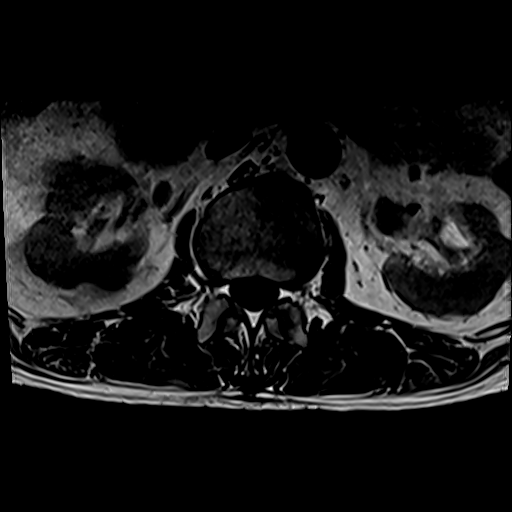
[im 48/48]
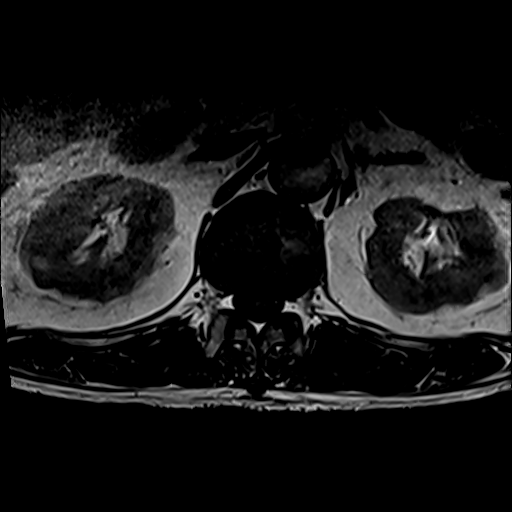

[Series 10: T1 fat-sat post-contrast · sagittal · 4.0mm · 0.81mm/px · 2 of 17 slices shown]
[im 1/17]
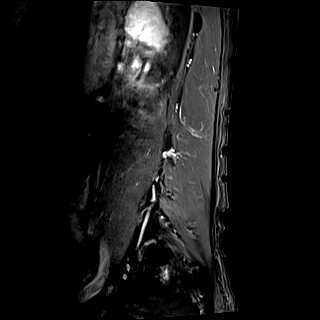
[im 17/17]
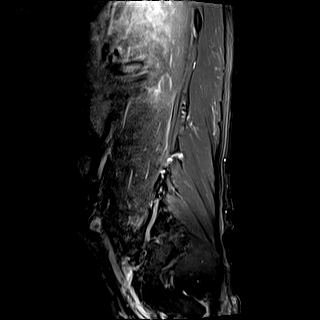

[Series 11: T1 post-contrast · axial · 4.0mm · 0.39mm/px · z∈[-590,-478]mm · 4 of 48 slices shown]
[im 1/48]
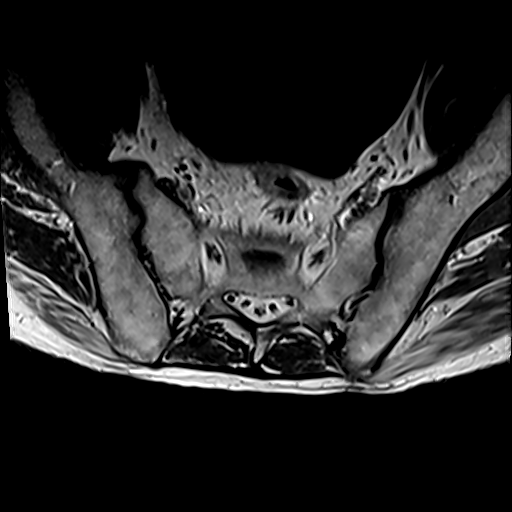
[im 8/48]
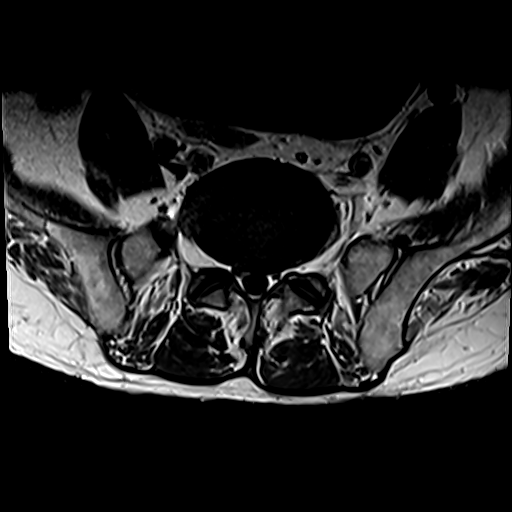
[im 16/48]
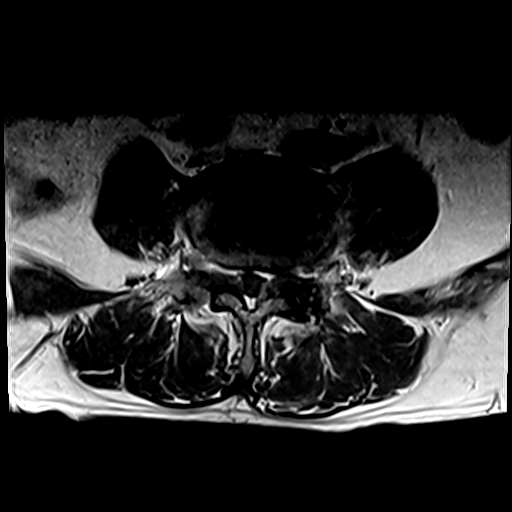
[im 24/48]
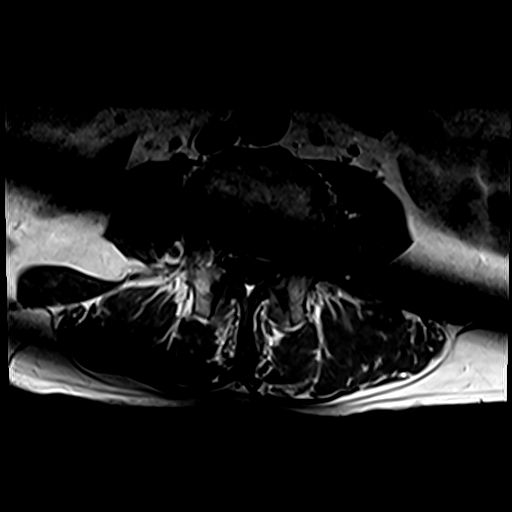

[26 of 48 positions shown; findings below may reference images not displayed]

FINDINGS: Segmentation:  Standard.

Alignment:  Physiologic.

Vertebrae:  No fracture, evidence of discitis, or bone lesion.

Conus medullaris and cauda equina: Conus extends to the L1 level.
Conus and cauda equina appear normal.

Paraspinal and other soft tissues: 2 cm right renal cyst

Disc levels:

T12-L1: Normal disc space and facets. No spinal canal or
neuroforaminal stenosis.

L1-L2: Normal disc space and facets. No spinal canal or
neuroforaminal stenosis.

L2-L3: Normal disc space and facets. No spinal canal or
neuroforaminal stenosis.

L3-L4: Normal disc space and facets. No spinal canal or
neuroforaminal stenosis.

L4-L5: Large disc bulge with mild facet hypertrophy. Severe spinal
canal stenosis. Mild bilateral foraminal stenosis.

L5-S1: Normal disc space and facets. No spinal canal or
neuroforaminal stenosis.

Visualized sacrum: Normal.
IMPRESSION: 1. Severe spinal canal stenosis at L4-L5 due to large disc bulge
with mild facet arthrosis.
2. Otherwise normal lumbar spine.  No osseous metastatic disease.

## 2021-02-20 MED ORDER — FENTANYL CITRATE (PF) 100 MCG/2ML IJ SOLN
50.0000 ug | Freq: Once | INTRAMUSCULAR | Status: AC
  Administered 2021-02-20: 50 ug via INTRAVENOUS
  Filled 2021-02-20: qty 2

## 2021-02-20 MED ORDER — IOHEXOL 300 MG/ML  SOLN
100.0000 mL | Freq: Once | INTRAMUSCULAR | Status: AC | PRN
  Administered 2021-02-20: 100 mL via INTRAVENOUS

## 2021-02-20 MED ORDER — GADOBUTROL 1 MMOL/ML IV SOLN
5.5000 mL | Freq: Once | INTRAVENOUS | Status: AC | PRN
  Administered 2021-02-20: 5.5 mL via INTRAVENOUS

## 2021-02-20 NOTE — ED Notes (Signed)
Attempted to bladder scan patient however machine keeps getting error code 210. RN Catalina Antigua came in and looked at the machine and was not able to get it to work either.

## 2021-02-20 NOTE — Telephone Encounter (Signed)
Call placed to patient's daughter Judson Roch to check on patient's status.  Judson Roch states that pt has not urinated since yesterday and is having "bad pain when trying to have a bowel movement and is not able to sit down."  Judson Roch states that pt had a normal BM yesterday.  Dr. Marin Olp notified.  Sarah notified per order of Dr. Marin Olp to take Mr. Sahakian to the [facility]  now.  Judson Roch states that she will take pt to the [facility] ER now.

## 2021-02-20 NOTE — Discharge Instructions (Addendum)
Please begin taking Miralax and colace (both available over the counter) to help with your constipation. Follow up with Urology for re-evaluation of your urinary catheter.

## 2021-02-20 NOTE — ED Notes (Signed)
Patient transported to MRI 

## 2021-02-20 NOTE — ED Notes (Signed)
Pt's foley changed to leg beg, discharge instructions gone over and verbalized understanding of caring for foley at home and keeping site clean by patient and family. All questions answered at this time.

## 2021-02-20 NOTE — ED Provider Notes (Addendum)
Care of the patient assumed at the change of shift here for constipation and urinary retention. Has known gastric cancer with liver mets. His CT shows a large stool ball which may be causing bladder outlet obstruction. I discussed this finding with the patient including need for manual disimpaction. He is in agreement to proceed but requested some pain medications first.   8:00 PM Rectal: Chaperone present. Moderate hard stool ball in rectum broken up and partially disimpacted. Patient tolerated well, will try to have BM on bedside commode now.   8:36 PM Patient has passed a large stool, but still unable to urinate. Going to MRI now, will need foley on return.   10:55 PM Patient reports improved symptoms after foley placement. MRI is negative for osseous metastatic lesions. There is a large disc bulge at L4-5 but he is not having any back pain or other radicular symptoms to suggest cauda equina. He will follow up with Urology for recheck of his urinary retention. Recommend he increase his bowel regimen to include Miralax and colace. PCP follow up. RTED for any other concerns.       Truddie Hidden, MD 02/20/21 2259

## 2021-02-20 NOTE — ED Provider Notes (Signed)
[name] DEPT Provider Note   CSN: [id number] Arrival date & time: 02/20/21  1301     History Chief Complaint  Patient presents with   Constipation    Jimmy Blankenship is a 70 y.o. male.  HPI    70 year old male comes in a chief complaint of constipation.  Patient has history of stomach cancer with mets to the liver.  He is currently going chemotherapy.  He reports having constipation for the last 14 days.  Patient has not had a good BM in that duration, what ever BM had he has had has been small, except for a singular episode of diarrhea.  He does not think he is passing flatus anymore.  No history of abdominal SBO.  Additionally, he also feels like he is not voiding like he should.  Today he has not really voided, although he has had some urge to go urinate.  He does have a history of enlarged prostate as well.  Patient denies any back pain.  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   CKD (chronic kidney disease)    Coronary Artery Disease s/p CABG    hx of PCI in 2000s in [place] // s/p NSTEMI in 7/21 >> CABG   Diabetes mellitus 2    Goals of care, counseling/discussion 08/22/2020   Heart failure with reduced ejection fraction    Ischemic CM // Echo 7/21 - EF 35 // Intraop TEE 7/21: EF 40-45 // Echocardiogram 9/21: apical AK, EF 40-45, Gr 2 DD, trace AI, mild MR, mild LAE, normal RVSF, no pericardial effusion   High cholesterol    Hypertension    Iron deficiency anemia    Heme + stools (during admit for NSTEMI in 7/21)   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 ([place]) 08/22/2020   Palpitations    Event monitor 10/21: NSR, rare PVCs, rare 4 beats NSVT, rare brief non-sustained SVT    Patient Active Problem List   Diagnosis Date Noted   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[place]) 08/22/2020   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 ([place]) 08/22/2020   Goals of care, counseling/discussion 08/22/2020   Iron  deficiency anemia    Benign neoplasm of transverse colon    Benign neoplasm of sigmoid colon    Upper GI bleeding 08/13/2020   DOE (dyspnea on exertion) 08/12/2020   S/P CABG x 3 03/24/2020   Chest pain 03/18/2020   Nonspecific chest pain 03/16/2020   CAD (coronary artery disease) 03/16/2020   DM2 (diabetes mellitus, type 2) ([place]) 03/16/2020   HTN (hypertension) 03/16/2020   HLD (hyperlipidemia) 03/16/2020   AKI (acute kidney injury) ([place]) 03/16/2020    Past Surgical History:  Procedure Laterality Date   BIOPSY  08/15/2020   Procedure: BIOPSY;  Surgeon: Ladene Artist, MD;  Location: [facility] ENDOSCOPY;  Service: Gastroenterology;;   COLONOSCOPY     around 2013. Normal per patient. [place]   COLONOSCOPY N/A 08/15/2020   Procedure: COLONOSCOPY;  Surgeon: Ladene Artist, MD;  Location: [place];  Service: Gastroenterology;  Laterality: N/A;   CORONARY ANGIOPLASTY WITH STENT PLACEMENT     CORONARY ARTERY BYPASS GRAFT N/A 03/24/2020   Procedure: CORONARY ARTERY BYPASS GRAFTING (CABG), ON PUMP, TIMES THREE, USING LEFT INTERNAL MAMMARY ARTERY AND RIGHT ENDOSCOPICALLY HARVESTED GREATER SAPHENOUS VEIN;  Surgeon: Ivin Poot, MD;  Location: [facility] [place];  Service: Open Heart Surgery;  Laterality: N/A;   ESOPHAGOGASTRODUODENOSCOPY N/A 08/15/2020   Procedure: ESOPHAGOGASTRODUODENOSCOPY (EGD);  Surgeon: Ladene Artist, MD;  Location: [facility];  Service: Gastroenterology;  Laterality: N/A;   IR IMAGING GUIDED PORT INSERTION  09/16/2020   LEFT HEART CATH AND CORONARY ANGIOGRAPHY N/A 03/17/2020   Procedure: LEFT HEART CATH AND CORONARY ANGIOGRAPHY;  Surgeon: Belva Crome, MD;  Location: [facility];  Service: Cardiovascular;  Laterality: N/A;   POLYPECTOMY  08/15/2020   Procedure: POLYPECTOMY;  Surgeon: Ladene Artist, MD;  Location: [facility] ENDOSCOPY;  Service: Gastroenterology;;   TEE WITHOUT CARDIOVERSION N/A 03/24/2020   Procedure: TRANSESOPHAGEAL ECHOCARDIOGRAM (TEE);  Surgeon: [name],  Collier Salina, MD;  Location: [facility];  Service: Open Heart Surgery;  Laterality: N/A;       Family History  Problem Relation Age of Onset   Atrial fibrillation Mother    Congestive Heart Failure Mother    Cancer Neg Hx     Social History   Tobacco Use   Smoking status: Former    Packs/day: 1.00    Years: 30.00    Pack years: 30.00    Types: Cigarettes    Quit date: 2006    Years since quitting: 16.4   Smokeless tobacco: Never   Tobacco comments:    quit 2008  Vaping Use   Vaping Use: Never used  Substance Use Topics   Alcohol use: Never   Drug use: Never    Home Medications Prior to Admission medications   Medication Sig Start Date End Date Taking? Authorizing Provider  atorvastatin (LIPITOR) 80 MG tablet Take 1 tablet (80 mg total) by mouth daily. 04/15/20   Richardson Dopp T, PA-C  canagliflozin (INVOKANA) 100 MG TABS tablet Take 100 mg by mouth daily. 01/03/21 04/03/21  [provider]  clopidogrel (PLAVIX) 75 MG tablet Take 75 mg by mouth daily.    [provider]  Cyanocobalamin (VITAMIN B-12 PO) Take 1 tablet by mouth daily.    [provider]  CYANOCOBALAMIN SL Take by mouth.    [provider]  cyclobenzaprine (FEXMID) 7.5 MG tablet Take 7.5 mg by mouth at bedtime. 12/28/20 03/28/21  [provider]  dexamethasone (DECADRON) 4 MG tablet Take 2 tablets (8 mg total) by mouth daily. Start the day after chemotherapy for 2 days. 02/01/21   Volanda Napoleon, MD  feeding supplement (ENSURE ENLIVE / ENSURE PLUS) LIQD Take 237 mLs by mouth 2 (two) times daily between meals. 08/16/20   Regalado, Belkys A, MD  gabapentin (NEURONTIN) 300 MG capsule Take 1 capsule (300 mg total) by mouth 2 (two) times daily. 12/07/20   Volanda Napoleon, MD  glimepiride (AMARYL) 2 MG tablet Take 1 tablet (2 mg total) by mouth 2 (two) times daily. 12/21/20   Volanda Napoleon, MD  lidocaine-prilocaine (EMLA) cream Apply to affected area once 02/01/21   Volanda Napoleon, MD   LORazepam (ATIVAN) 0.5 MG tablet Take 1 tablet (0.5 mg total) by mouth every 6 (six) hours as needed (Nausea or vomiting). 02/01/21   Volanda Napoleon, MD  metFORMIN (GLUCOPHAGE) 1000 MG tablet Take 1,000 mg by mouth 2 (two) times daily with a meal.    [provider]  metoprolol tartrate (LOPRESSOR) 50 MG tablet TAKE ONE TABLET BY MOUTH TWICE A DAY 11/16/20   Nahser, Wonda Cheng, MD  ondansetron (ZOFRAN) 8 MG tablet Take 1 tablet (8 mg total) by mouth 2 (two) times daily as needed for refractory nausea / vomiting. Start on day 3 after chemo. 02/01/21   Volanda Napoleon, MD  pantoprazole (  PROTONIX) 40 MG tablet Take 1 tablet (40 mg total) by mouth 2 (two) times daily. 11/15/20   Volanda Napoleon, MD  prochlorperazine (COMPAZINE) 10 MG tablet Take 1 tablet (10 mg total) by mouth every 6 (six) hours as needed (Nausea or vomiting). 02/01/21   Volanda Napoleon, MD    Allergies    Patient has no known allergies.  Review of Systems   Review of Systems  Constitutional:  Positive for activity change.  Gastrointestinal:  Positive for abdominal pain, nausea and vomiting.  Allergic/Immunologic: Negative for immunocompromised state.  Hematological:  Does not bruise/bleed easily.  All other systems reviewed and are negative.  Physical Exam Updated Vital Signs BP (!) 154/83   Pulse 88   Temp 98.1 F (36.7 C) (Oral)   Resp 16   Ht 5\' 9"  (1.753 m)   Wt 59 kg   SpO2 98%   BMI 19.20 kg/m   Physical Exam Vitals and nursing note reviewed.  Constitutional:      Appearance: He is well-developed.  HENT:     Head: Atraumatic.  Cardiovascular:     Rate and Rhythm: Normal rate.  Pulmonary:     Effort: Pulmonary effort is normal.  Abdominal:     General: There is distension.     Tenderness: There is abdominal tenderness.  Musculoskeletal:     Cervical back: Neck supple.  Skin:    General: Skin is warm.  Neurological:     Mental Status: He is alert and oriented to person, place, and time.     ED Results / Procedures / Treatments   Labs (all labs ordered are listed, but only abnormal results are displayed) Labs Reviewed  CBC WITH DIFFERENTIAL/PLATELET - Abnormal; Notable for the following components:      Result Value   RBC 3.90 (*)    Hemoglobin 11.2 (*)    HCT 35.6 (*)    RDW 15.6 (*)    Lymphs Abs 0.5 (*)    All other components within normal limits  COMPREHENSIVE METABOLIC PANEL - Abnormal; Notable for the following components:   Glucose, Bld 222 (*)    All other components within normal limits  LIPASE, BLOOD  URINALYSIS, ROUTINE W REFLEX MICROSCOPIC    EKG None  Radiology DG ABD ACUTE 2+V W 1V CHEST  Result Date: 02/20/2021 CLINICAL DATA:  Abdominal pain and constipation for 2 weeks EXAM: DG ABDOMEN ACUTE WITH 1 VIEW CHEST COMPARISON:  01/23/2021 FINDINGS: Cardiac shadow is within normal limits. Postsurgical changes are noted. Right-sided chest wall port is noted in satisfactory position. Small rounded calcification is noted overlying the posterior aspect of the right seventh rib consistent with a small bone island seen on prior CT examination. No focal infiltrate is seen. Scattered large and small bowel gas is noted. Mild retained fecal material is noted without obstructive change. No abnormal mass or abnormal calcifications are identified. No acute bony abnormality is seen. No free air is noted. IMPRESSION: Retained fecal material consistent with a degree of constipation. No obstructive changes are noted. No other focal abnormality is noted. Electronically Signed   By: Inez Catalina M.D.   On: 02/20/2021 14:53    Procedures Procedures   Medications Ordered in ED Medications  fentaNYL (SUBLIMAZE) injection 50 mcg (50 mcg Intravenous Given 02/20/21 1654)  iohexol (OMNIPAQUE) 300 MG/ML solution 100 mL (100 mLs Intravenous Contrast Given 02/20/21 1718)    ED Course  I have reviewed the triage vital signs and the nursing notes.  Pertinent  labs & imaging results  that were available during my care of the patient were reviewed by me and considered in my medical decision making (see chart for details).  Clinical Course as of 02/20/21 1722  Mon Feb 20, 2021  1720 On postvoid residual patient had more than 400 cc of urine documented.  On reassessment, patient does not have hyperreflexia but he does indicate having some pain and numbness around his anal and genitalia region.  The symptoms have not been new, the urinary retention is new.  He denies any new lower extremity weakness.  Given the constellation of constipation and now urinary retention, it is possible that there could be metastatic lesion in the spine.  MRI of the thoracic and lumbar spine has been added.  Dr. Karle Starch will follow up.  Patient made aware that we will order a urinary Foley catheter. [AN]    Clinical Course User Index [AN] Varney Biles, MD   MDM Rules/Calculators/A&P                          70 year old comes in a chief complaint of abdominal pain.  Patient has history of stomach cancer with mets to the liver.  He is having urinary retention along with constipation.  CT abdomen and pelvis ordered to evaluate for constipation versus small bowel obstruction.  Given his history of cancer, SBO due to a lead point of tumor is possible.  We will work on pain control for now.  Patient's postvoid residual was over 400 cc.  We will reassess him for any spinal cord issues, but initially the history was not suggestive of cord compression or cauda equina.   Will reassess to see if patient needs MRI in addition to the CT scan that has been ordered.  Final Clinical Impression(s) / ED Diagnoses Final diagnoses:  None    Rx / DC Orders ED Discharge Orders     None        Varney Biles, MD 02/20/21 1722

## 2021-02-20 NOTE — ED Triage Notes (Signed)
Patient c/o constipation x 2 weeks. Patient states he had a very small amount of stool yesterday. Patient reports that he had diarrhea yesterday.  Patient c/o not urinating well.

## 2021-02-20 NOTE — ED Notes (Signed)
Patient still gone for testing. Unable to update vitals.

## 2021-02-20 NOTE — ED Notes (Signed)
Bladder scanner borrowed from the 4th floor

## 2021-02-20 NOTE — ED Provider Notes (Signed)
Emergency Medicine Provider Triage Evaluation Note  Theodus Ran , a 70 y.o. male  was evaluated in triage.  Pt complains of rectal pain. Has had constipation. Denies abd pain or fevers. Has had some difficulty urinating.  Review of Systems  Positive: Constipation, rectal pain, difficulty urinating Negative: Abd pain  Physical Exam  Ht 5\' 9"  (1.753 m)   Wt 59 kg   BMI 19.20 kg/m  Gen:   Awake, no distress   Resp:  Normal effort  MSK:   Moves extremities without difficulty  Other:  Standing in room in no distress  Medical Decision Making  Medically screening exam initiated at 1:33 PM.  Appropriate orders placed.  Gerado Jomel Whittlesey was informed that the remainder of the evaluation will be completed by another provider, this initial triage assessment does not replace that evaluation, and the importance of remaining in the ED until their evaluation is complete.     Bishop Dublin 02/20/21 1335    Varney Biles, MD 02/21/21 1003

## 2021-02-21 ENCOUNTER — Inpatient Hospital Stay (HOSPITAL_BASED_OUTPATIENT_CLINIC_OR_DEPARTMENT_OTHER): Payer: Medicare Other | Admitting: Hematology & Oncology

## 2021-02-21 ENCOUNTER — Other Ambulatory Visit: Payer: Self-pay

## 2021-02-21 ENCOUNTER — Ambulatory Visit: Payer: Medicare Other

## 2021-02-21 ENCOUNTER — Inpatient Hospital Stay: Payer: Medicare Other | Attending: Hematology & Oncology

## 2021-02-21 ENCOUNTER — Encounter: Payer: Self-pay | Admitting: Hematology & Oncology

## 2021-02-21 ENCOUNTER — Inpatient Hospital Stay: Payer: Medicare Other

## 2021-02-21 VITALS — BP 103/66 | HR 70 | Temp 98.0°F | Resp 18 | Wt 129.2 lb

## 2021-02-21 DIAGNOSIS — I219 Acute myocardial infarction, unspecified: Secondary | ICD-10-CM | POA: Diagnosis not present

## 2021-02-21 DIAGNOSIS — R339 Retention of urine, unspecified: Secondary | ICD-10-CM | POA: Insufficient documentation

## 2021-02-21 DIAGNOSIS — C165 Malignant neoplasm of lesser curvature of stomach, unspecified: Secondary | ICD-10-CM

## 2021-02-21 DIAGNOSIS — D500 Iron deficiency anemia secondary to blood loss (chronic): Secondary | ICD-10-CM | POA: Diagnosis not present

## 2021-02-21 DIAGNOSIS — K922 Gastrointestinal hemorrhage, unspecified: Secondary | ICD-10-CM | POA: Insufficient documentation

## 2021-02-21 DIAGNOSIS — C799 Secondary malignant neoplasm of unspecified site: Secondary | ICD-10-CM

## 2021-02-21 DIAGNOSIS — Z5112 Encounter for antineoplastic immunotherapy: Secondary | ICD-10-CM | POA: Diagnosis present

## 2021-02-21 DIAGNOSIS — Z79899 Other long term (current) drug therapy: Secondary | ICD-10-CM | POA: Insufficient documentation

## 2021-02-21 DIAGNOSIS — C787 Secondary malignant neoplasm of liver and intrahepatic bile duct: Secondary | ICD-10-CM | POA: Insufficient documentation

## 2021-02-21 LAB — CBC WITH DIFFERENTIAL (CANCER CENTER ONLY)
Abs Immature Granulocytes: 0.04 K/uL (ref 0.00–0.07)
Basophils Absolute: 0.1 K/uL (ref 0.0–0.1)
Basophils Relative: 1 %
Eosinophils Absolute: 0.1 K/uL (ref 0.0–0.5)
Eosinophils Relative: 1 %
HCT: 30.5 % — ABNORMAL LOW (ref 39.0–52.0)
Hemoglobin: 9.9 g/dL — ABNORMAL LOW (ref 13.0–17.0)
Immature Granulocytes: 1 %
Lymphocytes Relative: 15 %
Lymphs Abs: 0.9 K/uL (ref 0.7–4.0)
MCH: 29.2 pg (ref 26.0–34.0)
MCHC: 32.5 g/dL (ref 30.0–36.0)
MCV: 90 fL (ref 80.0–100.0)
Monocytes Absolute: 0.6 K/uL (ref 0.1–1.0)
Monocytes Relative: 9 %
Neutro Abs: 4.6 K/uL (ref 1.7–7.7)
Neutrophils Relative %: 73 %
Platelet Count: 169 K/uL (ref 150–400)
RBC: 3.39 MIL/uL — ABNORMAL LOW (ref 4.22–5.81)
RDW: 15.8 % — ABNORMAL HIGH (ref 11.5–15.5)
WBC Count: 6.2 K/uL (ref 4.0–10.5)
nRBC: 0 % (ref 0.0–0.2)

## 2021-02-21 LAB — CMP (CANCER CENTER ONLY)
ALT: 11 U/L (ref 0–44)
AST: 14 U/L — ABNORMAL LOW (ref 15–41)
Albumin: 3.6 g/dL (ref 3.5–5.0)
Alkaline Phosphatase: 74 U/L (ref 38–126)
Anion gap: 8 (ref 5–15)
BUN: 13 mg/dL (ref 8–23)
CO2: 28 mmol/L (ref 22–32)
Calcium: 9.5 mg/dL (ref 8.9–10.3)
Chloride: 101 mmol/L (ref 98–111)
Creatinine: 0.97 mg/dL (ref 0.61–1.24)
GFR, Estimated: >60 mL/min (ref >60)
Glucose, Bld: 148 mg/dL — ABNORMAL HIGH (ref 70–99)
Potassium: 3.5 mmol/L (ref 3.5–5.1)
Sodium: 137 mmol/L (ref 135–145)
Total Bilirubin: 0.6 mg/dL (ref 0.3–1.2)
Total Protein: 5.7 g/dL — ABNORMAL LOW (ref 6.5–8.1)

## 2021-02-21 MED ORDER — SODIUM CHLORIDE 0.9 % IV SOLN
20.0000 mg | Freq: Once | INTRAVENOUS | Status: DC
  Filled 2021-02-21: qty 2

## 2021-02-21 MED ORDER — SODIUM CHLORIDE 0.9 % IV SOLN
Freq: Once | INTRAVENOUS | Status: AC
  Filled 2021-02-21: qty 250

## 2021-02-21 MED ORDER — MEGESTROL ACETATE 400 MG/10ML PO SUSP: 400.0000 mg | Freq: Two times a day (BID) | ORAL | 4 refills | Status: DC

## 2021-02-21 MED ORDER — SODIUM CHLORIDE 0.9 % IV SOLN
20.0000 mg | Freq: Once | INTRAVENOUS | Status: AC
  Administered 2021-02-21: 20 mg via INTRAVENOUS
  Filled 2021-02-21: qty 2

## 2021-02-21 MED ORDER — SODIUM CHLORIDE 0.9 % IV SOLN
40.0000 mg | Freq: Once | INTRAVENOUS | Status: DC
  Filled 2021-02-21: qty 4

## 2021-02-21 MED ORDER — SODIUM CHLORIDE 0.9 % IV SOLN
40.0000 mg | Freq: Once | INTRAVENOUS | Status: AC
  Administered 2021-02-21: 40 mg via INTRAVENOUS
  Filled 2021-02-21: qty 4

## 2021-02-21 MED ORDER — SODIUM CHLORIDE 0.9 % IV SOLN
INTRAVENOUS | Status: DC
  Filled 2021-02-21: qty 250

## 2021-02-21 NOTE — Progress Notes (Signed)
Hematology and Oncology Follow Up Visit  Jimmy Blankenship [id number] [birth year] 70 y.o. 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  Unfortunately, he has had some problems.  Yesterday, he had to go to the emergency room.  He apparently was impacted.  The bigger issue was that he had urinary retention.  I am not sure exactly why he would have had urinary retention.  He had had diarrhea after the Enhertu.  According to his daughter, this seemed to start 2 or 3 days afterwards.  Again this may have been from the Enhertu.  He does not have diarrhea anymore, obviously.  He is not eating.  He is lost 12 pounds since we last saw him.  This is incredibly troublesome to me.  He says that he just does not have much of an appetite.  I will try him on some Megace elixir (40 mg p.o. twice daily) and let see if this can help with his appetite.  Again I am unsure why he has a urinary retention.  As far as I know, he is not had any kind of urinary tract infection.  Has had no fever.  There is been no cough.  He has had no actual abdominal pain.  Currently, his performance status is ECOG 2.    Medications:  Current Outpatient Medications:    atorvastatin (LIPITOR) 80 MG tablet, Take 1 tablet (80 mg total) by mouth daily., Disp: 90 tablet, Rfl: 3   canagliflozin (INVOKANA) 100 MG TABS tablet, Take 100 mg by mouth daily., Disp: , Rfl:    clopidogrel (PLAVIX) 75 MG tablet, Take 75 mg by mouth daily., Disp: , Rfl:    Cyanocobalamin (VITAMIN B-12 PO), Take 1 tablet by mouth daily., Disp: , Rfl:    CYANOCOBALAMIN SL,  Take by mouth., Disp: , Rfl:    cyclobenzaprine (FEXMID) 7.5 MG tablet, Take 7.5 mg by mouth at bedtime., Disp: , Rfl:    dexamethasone (DECADRON) 4 MG tablet, Take 2 tablets (8 mg total) by mouth daily. Start the day after chemotherapy for 2 days., Disp: 30 tablet, Rfl: 1   feeding supplement (ENSURE ENLIVE / ENSURE PLUS) LIQD, Take 237 mLs by mouth 2 (two) times daily between meals., Disp: 237 mL, Rfl: 12   gabapentin (NEURONTIN) 300 MG capsule, Take 1 capsule (300 mg total) by mouth 2 (two) times daily., Disp: 60 capsule, Rfl: 5   glimepiride (AMARYL) 2 MG tablet, Take 1 tablet (2 mg total) by mouth 2 (two) times daily., Disp: 90 tablet, Rfl: 0   lidocaine-prilocaine (EMLA) cream, Apply to affected area once, Disp: 30 g, Rfl: 3   LORazepam (ATIVAN) 0.5 MG tablet, Take 1 tablet (0.5 mg total) by mouth every 6 (six) hours as needed (Nausea or vomiting)., Disp: 30 tablet, Rfl: 0   megestrol (MEGACE) 400 MG/10ML suspension, Take 10 mLs (400 mg total) by mouth 2 (two) times daily., Disp: 480 mL, Rfl: 4   metFORMIN (GLUCOPHAGE) 1000 MG tablet, Take 1,000 mg by mouth 2 (two) times daily with a meal., Disp: , Rfl:    metoprolol tartrate (LOPRESSOR) 50 MG tablet,  TAKE ONE TABLET BY MOUTH TWICE A DAY, Disp: 180 tablet, Rfl: 3   ondansetron (ZOFRAN) 8 MG tablet, Take 1 tablet (8 mg total) by mouth 2 (two) times daily as needed for refractory nausea / vomiting. Start on day 3 after chemo., Disp: 30 tablet, Rfl: 1   prochlorperazine (COMPAZINE) 10 MG tablet, Take 1 tablet (10 mg total) by mouth every 6 (six) hours as needed (Nausea or vomiting)., Disp: 30 tablet, Rfl: 1   pantoprazole (PROTONIX) 40 MG tablet, Take 1 tablet (40 mg total) by mouth 2 (two) times daily., Disp: 60 tablet, Rfl: 1  Current Facility-Administered Medications:    0.9 %  sodium chloride infusion, , Intravenous, Continuous, Alhaji Mcneal, Rudell Cobb, MD   dexamethasone (DECADRON) 20 mg in sodium chloride 0.9 % 50 mL IVPB, 20 mg, Intravenous,  Once, Jordin Dambrosio, Rudell Cobb, MD   famotidine (PEPCID) 40 mg in sodium chloride 0.9 % 100 mL IVPB, 40 mg, Intravenous, Once, Letonia Stead, Rudell Cobb, MD  Allergies: No Known Allergies  Past Medical History, Surgical history, Social history, and Family History were reviewed and updated.  Review of Systems: Review of Systems  Constitutional: Negative.   HENT:  Negative.    Eyes: Negative.   Respiratory: Negative.    Cardiovascular: Negative.   Endocrine: Negative.   Genitourinary: Negative.    Musculoskeletal: Negative.   Skin: Negative.   Neurological: Negative.   Hematological: Negative.   Psychiatric/Behavioral: Negative.     Physical Exam:  weight is 129 lb 4 oz (58.6 kg). His oral temperature is 98 F (36.7 C). His blood pressure is 103/66 and his pulse is 70. His respiration is 18 and oxygen saturation is 100%.   Wt Readings from Last 3 Encounters:  02/21/21 129 lb 4 oz (58.6 kg)  02/20/21 130 lb (59 kg)  02/06/21 141 lb 1.5 oz (64 kg)    Physical Exam Vitals reviewed.  HENT:     Head: Normocephalic and atraumatic.  Eyes:     Pupils: Pupils are equal, round, and reactive to light.  Cardiovascular:     Rate and Rhythm: Normal rate and regular rhythm.     Heart sounds: Normal heart sounds.  Pulmonary:     Effort: Pulmonary effort is normal.     Breath sounds: Normal breath sounds.  Abdominal:     General: Bowel sounds are normal.     Palpations: Abdomen is soft.  Musculoskeletal:        General: No tenderness or deformity. Normal range of motion.     Cervical back: Normal range of motion.  Lymphadenopathy:     Cervical: No cervical adenopathy.  Skin:    General: Skin is warm and dry.     Findings: No erythema or rash.  Neurological:     Mental Status: He is alert and oriented to person, place, and time.  Psychiatric:        Behavior: Behavior normal.        Thought Content: Thought content normal.        Judgment: Judgment normal.   Lab Results  Component Value  Date   WBC 6.2 02/21/2021   HGB 9.9 (L) 02/21/2021   HCT 30.5 (L) 02/21/2021   MCV 90.0 02/21/2021   PLT 169 02/21/2021     Chemistry      Component Value Date/Time   NA 137 02/21/2021 0950   NA 140 06/10/2020 0935   K 3.5 02/21/2021 0950   CL 101 02/21/2021 0950   CO2 28 02/21/2021  0950   BUN 13 02/21/2021 0950   BUN 17 06/10/2020 0935   CREATININE 0.97 02/21/2021 0950      Component Value Date/Time   CALCIUM 9.5 02/21/2021 0950   ALKPHOS 74 02/21/2021 0950   AST 14 (L) 02/21/2021 0950   ALT 11 02/21/2021 0950   BILITOT 0.6 02/21/2021 0950      Impression and Plan: Mr. Sandler is a very nice 71 year old [name] male.  He has metastatic adenocarcinoma of the stomach.  He has had 7 cycles of chemotherapy along with immunotherapy and targeted therapy.   I am going to have to hold the Enhertu for right now.  Again I am not sure exactly what side effects he has had from the Enhertu.  I will have to see about the diarrhea and the urinary retention.  I think he sees the urologist next week.  Hopefully, the catheter can come out.  He deftly needs some IV fluids.  I will give him some IV fluids today.  I will give him a little bit of Decadron today also.  I cannot find anything neurologically that would suggest an issue with the urinary retention.  We will plan to get him back here in another couple weeks.  Hopefully, we can use the Enhertu at that time.  Marland Kitchen  Volanda Napoleon, MD 6/14/202212:38 PM

## 2021-02-21 NOTE — Patient Instructions (Signed)

## 2021-02-21 NOTE — Patient Instructions (Signed)

## 2021-02-22 ENCOUNTER — Telehealth: Payer: Self-pay

## 2021-02-22 LAB — IRON AND TIBC
Iron: 19 ug/dL — ABNORMAL LOW (ref 42–163)
Saturation Ratios: 7 % — ABNORMAL LOW (ref 20–55)
TIBC: 275 ug/dL (ref 202–409)
UIBC: 256 ug/dL (ref 117–376)

## 2021-02-22 LAB — FERRITIN: Ferritin: 257 ng/mL (ref 24–336)

## 2021-02-23 ENCOUNTER — Other Ambulatory Visit: Payer: Self-pay

## 2021-02-23 ENCOUNTER — Inpatient Hospital Stay: Payer: Medicare Other

## 2021-02-23 VITALS — BP 127/71 | HR 67 | Temp 97.7°F | Resp 17

## 2021-02-23 DIAGNOSIS — K922 Gastrointestinal hemorrhage, unspecified: Secondary | ICD-10-CM

## 2021-02-23 DIAGNOSIS — Z5112 Encounter for antineoplastic immunotherapy: Secondary | ICD-10-CM | POA: Diagnosis not present

## 2021-02-23 DIAGNOSIS — D500 Iron deficiency anemia secondary to blood loss (chronic): Secondary | ICD-10-CM

## 2021-02-23 MED ORDER — IRON SUCROSE 20 MG/ML IV SOLN
200.0000 mg | Freq: Once | INTRAVENOUS | Status: AC
  Administered 2021-02-23: 200 mg via INTRAVENOUS
  Filled 2021-02-23: qty 200

## 2021-02-23 MED ORDER — HEPARIN SOD (PORK) LOCK FLUSH 100 UNIT/ML IV SOLN
500.0000 [IU] | Freq: Once | INTRAVENOUS | Status: AC | PRN
  Administered 2021-02-23: 500 [IU]
  Filled 2021-02-23: qty 5

## 2021-02-23 MED ORDER — SODIUM CHLORIDE 0.9% FLUSH
10.0000 mL | Freq: Once | INTRAVENOUS | Status: AC | PRN
  Administered 2021-02-23: 10 mL
  Filled 2021-02-23: qty 10

## 2021-02-23 MED ORDER — SODIUM CHLORIDE 0.9 % IV SOLN
Freq: Once | INTRAVENOUS | Status: AC
  Filled 2021-02-23: qty 250

## 2021-02-23 NOTE — Patient Instructions (Signed)

## 2021-02-28 ENCOUNTER — Other Ambulatory Visit: Payer: Self-pay

## 2021-02-28 ENCOUNTER — Inpatient Hospital Stay: Payer: Medicare Other

## 2021-02-28 VITALS — BP 117/59 | HR 71 | Temp 99.1°F | Resp 18

## 2021-02-28 DIAGNOSIS — D500 Iron deficiency anemia secondary to blood loss (chronic): Secondary | ICD-10-CM

## 2021-02-28 DIAGNOSIS — K922 Gastrointestinal hemorrhage, unspecified: Secondary | ICD-10-CM

## 2021-02-28 DIAGNOSIS — Z5112 Encounter for antineoplastic immunotherapy: Secondary | ICD-10-CM | POA: Diagnosis not present

## 2021-02-28 MED ORDER — SODIUM CHLORIDE 0.9 % IV SOLN
200.0000 mg | Freq: Once | INTRAVENOUS | Status: AC
  Administered 2021-02-28: 200 mg via INTRAVENOUS
  Filled 2021-02-28: qty 200

## 2021-02-28 MED ORDER — SODIUM CHLORIDE 0.9% FLUSH
10.0000 mL | Freq: Once | INTRAVENOUS | Status: AC
Start: 2021-02-28 — End: 2021-02-28
  Administered 2021-02-28: 10 mL via INTRAVENOUS
  Filled 2021-02-28: qty 10

## 2021-02-28 MED ORDER — HEPARIN SOD (PORK) LOCK FLUSH 100 UNIT/ML IV SOLN
500.0000 [IU] | Freq: Once | INTRAVENOUS | Status: AC
  Administered 2021-02-28: 500 [IU] via INTRAVENOUS
  Filled 2021-02-28: qty 5

## 2021-02-28 MED ORDER — SODIUM CHLORIDE 0.9 % IV SOLN
Freq: Once | INTRAVENOUS | Status: AC
Start: 2021-02-28 — End: 2021-02-28
  Filled 2021-02-28: qty 250

## 2021-02-28 NOTE — Patient Instructions (Signed)

## 2021-03-01 ENCOUNTER — Telehealth: Payer: Self-pay

## 2021-03-01 ENCOUNTER — Ambulatory Visit (HOSPITAL_COMMUNITY)
Admission: RE | Admit: 2021-03-01 | Discharge: 2021-03-01 | Disposition: A | Discharge status: Home / Self Care | Payer: Medicare Other | Source: Ambulatory Visit | Attending: Hematology & Oncology | Admitting: Hematology & Oncology

## 2021-03-01 DIAGNOSIS — I2510 Atherosclerotic heart disease of native coronary artery without angina pectoris: Secondary | ICD-10-CM | POA: Insufficient documentation

## 2021-03-01 DIAGNOSIS — E785 Hyperlipidemia, unspecified: Secondary | ICD-10-CM | POA: Insufficient documentation

## 2021-03-01 DIAGNOSIS — C169 Malignant neoplasm of stomach, unspecified: Secondary | ICD-10-CM

## 2021-03-01 DIAGNOSIS — Z951 Presence of aortocoronary bypass graft: Secondary | ICD-10-CM | POA: Insufficient documentation

## 2021-03-01 DIAGNOSIS — Z0189 Encounter for other specified special examinations: Secondary | ICD-10-CM

## 2021-03-01 DIAGNOSIS — I340 Nonrheumatic mitral (valve) insufficiency: Secondary | ICD-10-CM | POA: Diagnosis not present

## 2021-03-01 DIAGNOSIS — Z01810 Encounter for preprocedural cardiovascular examination: Secondary | ICD-10-CM | POA: Insufficient documentation

## 2021-03-01 DIAGNOSIS — C799 Secondary malignant neoplasm of unspecified site: Secondary | ICD-10-CM | POA: Diagnosis not present

## 2021-03-01 DIAGNOSIS — I358 Other nonrheumatic aortic valve disorders: Secondary | ICD-10-CM | POA: Insufficient documentation

## 2021-03-01 DIAGNOSIS — I119 Hypertensive heart disease without heart failure: Secondary | ICD-10-CM | POA: Diagnosis not present

## 2021-03-01 DIAGNOSIS — E119 Type 2 diabetes mellitus without complications: Secondary | ICD-10-CM | POA: Diagnosis not present

## 2021-03-01 LAB — ECHOCARDIOGRAM LIMITED
Area-P 1/2: 3.77 cm2
MV M vel: 6.49 m/s
MV Peak grad: 168.5 mmHg
Radius: 0.5 cm
S' Lateral: 4.1 cm
Single Plane A4C EF: 40.8 %

## 2021-03-01 MED ORDER — PERFLUTREN LIPID MICROSPHERE
1.0000 mL | INTRAVENOUS | Status: AC | PRN
  Administered 2021-03-01: 5 mL via INTRAVENOUS
  Filled 2021-03-01: qty 10

## 2021-03-01 NOTE — Significant Event (Addendum)
Significant event:  Mr. Testa echocardiogram shows new LV thrombus. His EF and apical akinesis are similar to prior, but the thrombi have not been noted before.  I called the on-call oncology service and spoke to coordinator Mariah at 6:26 PM. She has paged the on call oncologist, but at this time I have not received a call back. I will continue to try to urgently relay the finding.  I have also send a high priority message regarding the findings to Dr. Martha Clan, Dr. Acie Fredrickson, and Richardson Dopp.  ADDENDED to add: I spoke to on call oncology physician at 6:53 PM.   Buford Dresser, MD, PhD, [place]  [address] [place], [place] [address] [phone]

## 2021-03-02 ENCOUNTER — Inpatient Hospital Stay (HOSPITAL_BASED_OUTPATIENT_CLINIC_OR_DEPARTMENT_OTHER): Payer: Medicare Other | Admitting: Hematology & Oncology

## 2021-03-02 ENCOUNTER — Other Ambulatory Visit: Payer: Self-pay

## 2021-03-02 ENCOUNTER — Encounter: Payer: Self-pay | Admitting: Hematology & Oncology

## 2021-03-02 ENCOUNTER — Inpatient Hospital Stay: Payer: Medicare Other

## 2021-03-02 VITALS — BP 112/62 | HR 89 | Temp 98.1°F | Resp 16 | Wt 143.0 lb

## 2021-03-02 DIAGNOSIS — Z5112 Encounter for antineoplastic immunotherapy: Secondary | ICD-10-CM | POA: Diagnosis not present

## 2021-03-02 DIAGNOSIS — C169 Malignant neoplasm of stomach, unspecified: Secondary | ICD-10-CM | POA: Diagnosis not present

## 2021-03-02 DIAGNOSIS — I513 Intracardiac thrombosis, not elsewhere classified: Secondary | ICD-10-CM

## 2021-03-02 DIAGNOSIS — C799 Secondary malignant neoplasm of unspecified site: Secondary | ICD-10-CM

## 2021-03-02 DIAGNOSIS — C165 Malignant neoplasm of lesser curvature of stomach, unspecified: Secondary | ICD-10-CM

## 2021-03-02 HISTORY — DX: Intracardiac thrombosis, not elsewhere classified: I51.3

## 2021-03-02 LAB — CBC WITH DIFFERENTIAL (CANCER CENTER ONLY)
Abs Immature Granulocytes: 0.13 10*3/uL — ABNORMAL HIGH (ref 0.00–0.07)
Basophils Absolute: 0.1 10*3/uL (ref 0.0–0.1)
Basophils Relative: 1 %
Eosinophils Absolute: 0.1 10*3/uL (ref 0.0–0.5)
Eosinophils Relative: 1 %
HCT: 33.2 % — ABNORMAL LOW (ref 39.0–52.0)
Hemoglobin: 10.5 g/dL — ABNORMAL LOW (ref 13.0–17.0)
Immature Granulocytes: 2 %
Lymphocytes Relative: 21 %
Lymphs Abs: 1.7 10*3/uL (ref 0.7–4.0)
MCH: 29 pg (ref 26.0–34.0)
MCHC: 31.6 g/dL (ref 30.0–36.0)
MCV: 91.7 fL (ref 80.0–100.0)
Monocytes Absolute: 0.8 10*3/uL (ref 0.1–1.0)
Monocytes Relative: 10 %
Neutro Abs: 5.4 10*3/uL (ref 1.7–7.7)
Neutrophils Relative %: 65 %
Platelet Count: 235 10*3/uL (ref 150–400)
RBC: 3.62 MIL/uL — ABNORMAL LOW (ref 4.22–5.81)
RDW: 15.9 % — ABNORMAL HIGH (ref 11.5–15.5)
WBC Count: 8.1 10*3/uL (ref 4.0–10.5)
nRBC: 0 % (ref 0.0–0.2)

## 2021-03-02 LAB — CMP (CANCER CENTER ONLY)
ALT: 14 U/L (ref 0–44)
AST: 15 U/L (ref 15–41)
Albumin: 3.9 g/dL (ref 3.5–5.0)
Alkaline Phosphatase: 69 U/L (ref 38–126)
Anion gap: 11 (ref 5–15)
BUN: 18 mg/dL (ref 8–23)
CO2: 26 mmol/L (ref 22–32)
Calcium: 9.9 mg/dL (ref 8.9–10.3)
Chloride: 104 mmol/L (ref 98–111)
Creatinine: 1 mg/dL (ref 0.61–1.24)
GFR, Estimated: >60 mL/min (ref >60)
Glucose, Bld: 196 mg/dL — ABNORMAL HIGH (ref 70–99)
Potassium: 3.9 mmol/L (ref 3.5–5.1)
Sodium: 141 mmol/L (ref 135–145)
Total Bilirubin: 0.4 mg/dL (ref 0.3–1.2)
Total Protein: 6.4 g/dL — ABNORMAL LOW (ref 6.5–8.1)

## 2021-03-02 LAB — RETICULOCYTES
Immature Retic Fract: 27.9 % — ABNORMAL HIGH (ref 2.3–15.9)
RBC.: 3.65 MIL/uL — ABNORMAL LOW (ref 4.22–5.81)
Retic Count, Absolute: 94.2 10*3/uL (ref 19.0–186.0)
Retic Ct Pct: 2.6 % (ref 0.4–3.1)

## 2021-03-02 LAB — PREALBUMIN: Prealbumin: 26.6 mg/dL (ref 18–38)

## 2021-03-02 MED ORDER — RIVAROXABAN (XARELTO) VTE STARTER PACK (15 & 20 MG): ORAL_TABLET | ORAL | 0 refills | Status: DC

## 2021-03-02 NOTE — Progress Notes (Signed)
xz Hematology and Oncology Follow Up Visit  Jimmy Blankenship [id number] March 29, 1951 70 y.o. 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 Goudeau is back for an unscheduled visit.  I was notified this morning that he had his echocardiogram done yesterday.  This showed that he had ejection fraction of 40-45%.  He appeared to have a thrombus in the left ventricle.  Because this, we had him come in today.  He looks much better.  He is eating better.  His weight is up 14 pounds.  We do have him on Megace elixir.  However, I really do not think that this was the issue with respect to the thrombus.  He was only on the Megace elixir for less than a week.  I am sure that the thrombus has some to do with his underlying malignancy.  He is not have any chest pain.  He is having no shortness of breath.  He is having no headache.  Again he looks much better.  I am just happy that his quality life is better.  I think we can get him on Xarelto.  I think this would be reasonable.  I have called in a starter pack for him.  I wrote down how he is to take the Xarelto.  He has had no problems with bleeding.  There is been no issues with respect to GI bleeding.  He has had no change in bowel or bladder habits.  Is no issues with leg swelling or pain.  Overall, his performance status is ECOG 1.     Medications:  Current Outpatient Medications:    atorvastatin (LIPITOR) 80 MG tablet, Take 1 tablet (80 mg total) by mouth daily., Disp: 90 tablet, Rfl: 3   canagliflozin (INVOKANA) 100 MG  TABS tablet, Take 100 mg by mouth daily., Disp: , Rfl:    clopidogrel (PLAVIX) 75 MG tablet, Take 75 mg by mouth daily., Disp: , Rfl:    Cyanocobalamin (VITAMIN B-12 PO), Take 1 tablet by mouth daily., Disp: , Rfl:    CYANOCOBALAMIN SL, Take by mouth., Disp: , Rfl:    cyclobenzaprine (FEXMID) 7.5 MG tablet, Take 7.5 mg by mouth at bedtime., Disp: , Rfl:    dexamethasone (DECADRON) 4 MG tablet, Take 2 tablets (8 mg total) by mouth daily. Start the day after chemotherapy for 2 days., Disp: 30 tablet, Rfl: 1   feeding supplement (ENSURE ENLIVE / ENSURE PLUS) LIQD, Take 237 mLs by mouth 2 (two) times daily between meals., Disp: 237 mL, Rfl: 12   gabapentin (NEURONTIN) 300 MG capsule, Take 1 capsule (300 mg total) by mouth 2 (two) times daily., Disp: 60 capsule, Rfl: 5   glimepiride (AMARYL) 2 MG tablet, Take 1 tablet (2 mg total) by mouth 2 (two) times daily., Disp: 90 tablet, Rfl: 0   lidocaine-prilocaine (EMLA) cream, Apply to affected area once, Disp: 30 g, Rfl: 3   LORazepam (ATIVAN) 0.5 MG tablet, Take 1 tablet (0.5 mg total) by mouth every 6 (six) hours as needed (Nausea or vomiting)., Disp: 30 tablet, Rfl: 0  megestrol (MEGACE) 400 MG/10ML suspension, Take 10 mLs (400 mg total) by mouth 2 (two) times daily., Disp: 480 mL, Rfl: 4   metFORMIN (GLUCOPHAGE) 1000 MG tablet, Take 1,000 mg by mouth 2 (two) times daily with a meal., Disp: , Rfl:    metoprolol tartrate (LOPRESSOR) 50 MG tablet, TAKE ONE TABLET BY MOUTH TWICE A DAY, Disp: 180 tablet, Rfl: 3   ondansetron (ZOFRAN) 8 MG tablet, Take 1 tablet (8 mg total) by mouth 2 (two) times daily as needed for refractory nausea / vomiting. Start on day 3 after chemo., Disp: 30 tablet, Rfl: 1   pantoprazole (PROTONIX) 40 MG tablet, Take 1 tablet (40 mg total) by mouth 2 (two) times daily., Disp: 60 tablet, Rfl: 1   prochlorperazine (COMPAZINE) 10 MG tablet, Take 1 tablet (10 mg total) by mouth every 6 (six) hours as needed (Nausea or vomiting)., Disp: 30  tablet, Rfl: 1  Allergies: No Known Allergies  Past Medical History, Surgical history, Social history, and Family History were reviewed and updated.  Review of Systems: Review of Systems  Constitutional: Negative.   HENT:  Negative.    Eyes: Negative.   Respiratory: Negative.    Cardiovascular: Negative.   Endocrine: Negative.   Genitourinary: Negative.    Musculoskeletal: Negative.   Skin: Negative.   Neurological: Negative.   Hematological: Negative.   Psychiatric/Behavioral: Negative.     Physical Exam:  weight is 143 lb (64.9 kg). His oral temperature is 98.1 F (36.7 C). His blood pressure is 112/62 and his pulse is 89. His respiration is 16 and oxygen saturation is 98%.   Wt Readings from Last 3 Encounters:  03/02/21 143 lb (64.9 kg)  02/21/21 129 lb 4 oz (58.6 kg)  02/20/21 130 lb (59 kg)    Physical Exam Vitals reviewed.  HENT:     Head: Normocephalic and atraumatic.  Eyes:     Pupils: Pupils are equal, round, and reactive to light.  Cardiovascular:     Rate and Rhythm: Normal rate and regular rhythm.     Heart sounds: Normal heart sounds.  Pulmonary:     Effort: Pulmonary effort is normal.     Breath sounds: Normal breath sounds.  Abdominal:     General: Bowel sounds are normal.     Palpations: Abdomen is soft.  Musculoskeletal:        General: No tenderness or deformity. Normal range of motion.     Cervical back: Normal range of motion.  Lymphadenopathy:     Cervical: No cervical adenopathy.  Skin:    General: Skin is warm and dry.     Findings: No erythema or rash.  Neurological:     Mental Status: He is alert and oriented to person, place, and time.  Psychiatric:        Behavior: Behavior normal.        Thought Content: Thought content normal.        Judgment: Judgment normal.   Lab Results  Component Value Date   WBC 8.1 03/02/2021   HGB 10.5 (L) 03/02/2021   HCT 33.2 (L) 03/02/2021   MCV 91.7 03/02/2021   PLT 235 03/02/2021      Chemistry      Component Value Date/Time   NA 141 03/02/2021 1506   NA 140 06/10/2020 0935   K 3.9 03/02/2021 1506   CL 104 03/02/2021 1506   CO2 26 03/02/2021 1506   BUN 18 03/02/2021 1506   BUN 17 06/10/2020 0935  CREATININE 1.00 03/02/2021 1506      Component Value Date/Time   CALCIUM 9.9 03/02/2021 1506   ALKPHOS 69 03/02/2021 1506   AST 15 03/02/2021 1506   ALT 14 03/02/2021 1506   BILITOT 0.4 03/02/2021 1506      Impression and Plan: Mr. Murri is a very nice 70 year old [name] male.  He has metastatic adenocarcinoma of the stomach.  He has had 7 cycles of chemotherapy along with immunotherapy and targeted therapy.   We now have to deal with his left ventricular thrombus.  Again, I will put him on Xarelto.  I think this is reasonable for [place].  I know the Megace is really helping him.  I know this is improving his quality of life.  I forgot to mention that he has seen urology.  The Foley catheter has been taken out.  He is on medication now to help with the bladder contraction.  I told him to stop the Plavix that he was on.  I spoke to his daughter on the phone about this.  I wrote down that to stop the Plavix when he starts the Xarelto.  He is supposed to see me next week.  We will keep that appointment as scheduled.  Hopefully, we will can restart the Enhertu.   Volanda Napoleon, MD 6/23/20224:22 PM

## 2021-03-03 ENCOUNTER — Other Ambulatory Visit (HOSPITAL_BASED_OUTPATIENT_CLINIC_OR_DEPARTMENT_OTHER): Payer: Self-pay

## 2021-03-03 ENCOUNTER — Other Ambulatory Visit: Payer: Self-pay | Admitting: *Deleted

## 2021-03-03 ENCOUNTER — Telehealth: Payer: Self-pay | Admitting: *Deleted

## 2021-03-03 ENCOUNTER — Encounter: Payer: Self-pay | Admitting: *Deleted

## 2021-03-03 ENCOUNTER — Encounter: Payer: Self-pay | Admitting: Family

## 2021-03-03 ENCOUNTER — Encounter: Payer: Self-pay | Admitting: Hematology & Oncology

## 2021-03-03 LAB — IRON AND TIBC
Iron: 71 ug/dL (ref 42–163)
Saturation Ratios: 25 % (ref 20–55)
TIBC: 284 ug/dL (ref 202–409)
UIBC: 213 ug/dL (ref 117–376)

## 2021-03-03 LAB — FERRITIN: Ferritin: 758 ng/mL — ABNORMAL HIGH (ref 24–336)

## 2021-03-03 MED ORDER — RIVAROXABAN (XARELTO) VTE STARTER PACK (15 & 20 MG)
ORAL_TABLET | ORAL | 0 refills | Status: DC
  Filled 2021-03-03: qty 51, 30d supply, fill #0

## 2021-03-03 NOTE — Progress Notes (Signed)
Call received from [facility] to inform Dr. Marin Olp that they will not have Xarelto starter pack available until Monday, 03/06/21 for pt.  Dr. Marin Olp notified.  Prescription for Xarelto canceled at [facility] and resent to [facility] per order of Dr. Marin Olp.

## 2021-03-03 NOTE — Telephone Encounter (Signed)
Per 03/02/21 los - keep his appointment already scheduled for next week.

## 2021-03-06 ENCOUNTER — Other Ambulatory Visit: Payer: Self-pay | Admitting: Family

## 2021-03-06 DIAGNOSIS — D500 Iron deficiency anemia secondary to blood loss (chronic): Secondary | ICD-10-CM

## 2021-03-06 DIAGNOSIS — C169 Malignant neoplasm of stomach, unspecified: Secondary | ICD-10-CM

## 2021-03-06 DIAGNOSIS — I513 Intracardiac thrombosis, not elsewhere classified: Secondary | ICD-10-CM

## 2021-03-07 ENCOUNTER — Other Ambulatory Visit: Payer: Self-pay

## 2021-03-07 ENCOUNTER — Telehealth: Payer: Self-pay

## 2021-03-07 ENCOUNTER — Inpatient Hospital Stay (HOSPITAL_BASED_OUTPATIENT_CLINIC_OR_DEPARTMENT_OTHER): Payer: Medicare Other | Admitting: Family

## 2021-03-07 ENCOUNTER — Inpatient Hospital Stay: Payer: Medicare Other

## 2021-03-07 ENCOUNTER — Encounter: Payer: Self-pay | Admitting: Family

## 2021-03-07 VITALS — BP 122/65 | HR 82 | Temp 98.4°F | Resp 18 | Ht 69.0 in | Wt 142.0 lb

## 2021-03-07 DIAGNOSIS — C799 Secondary malignant neoplasm of unspecified site: Secondary | ICD-10-CM

## 2021-03-07 DIAGNOSIS — Z5112 Encounter for antineoplastic immunotherapy: Secondary | ICD-10-CM | POA: Diagnosis not present

## 2021-03-07 DIAGNOSIS — D500 Iron deficiency anemia secondary to blood loss (chronic): Secondary | ICD-10-CM | POA: Diagnosis not present

## 2021-03-07 DIAGNOSIS — C169 Malignant neoplasm of stomach, unspecified: Secondary | ICD-10-CM

## 2021-03-07 DIAGNOSIS — I513 Intracardiac thrombosis, not elsewhere classified: Secondary | ICD-10-CM

## 2021-03-07 DIAGNOSIS — C165 Malignant neoplasm of lesser curvature of stomach, unspecified: Secondary | ICD-10-CM

## 2021-03-07 LAB — CBC WITH DIFFERENTIAL (CANCER CENTER ONLY)
Abs Immature Granulocytes: 0.06 10*3/uL (ref 0.00–0.07)
Basophils Absolute: 0.1 10*3/uL (ref 0.0–0.1)
Basophils Relative: 1 %
Eosinophils Absolute: 0.2 10*3/uL (ref 0.0–0.5)
Eosinophils Relative: 3 %
HCT: 31.5 % — ABNORMAL LOW (ref 39.0–52.0)
Hemoglobin: 10.1 g/dL — ABNORMAL LOW (ref 13.0–17.0)
Immature Granulocytes: 1 %
Lymphocytes Relative: 14 %
Lymphs Abs: 1.3 10*3/uL (ref 0.7–4.0)
MCH: 29.4 pg (ref 26.0–34.0)
MCHC: 32.1 g/dL (ref 30.0–36.0)
MCV: 91.6 fL (ref 80.0–100.0)
Monocytes Absolute: 0.8 10*3/uL (ref 0.1–1.0)
Monocytes Relative: 9 %
Neutro Abs: 6.6 10*3/uL (ref 1.7–7.7)
Neutrophils Relative %: 72 %
Platelet Count: 215 10*3/uL (ref 150–400)
RBC: 3.44 MIL/uL — ABNORMAL LOW (ref 4.22–5.81)
RDW: 16 % — ABNORMAL HIGH (ref 11.5–15.5)
WBC Count: 9.1 10*3/uL (ref 4.0–10.5)
nRBC: 0 % (ref 0.0–0.2)

## 2021-03-07 LAB — CMP (CANCER CENTER ONLY)
ALT: 16 U/L (ref 0–44)
AST: 16 U/L (ref 15–41)
Albumin: 3.9 g/dL (ref 3.5–5.0)
Alkaline Phosphatase: 80 U/L (ref 38–126)
Anion gap: 11 (ref 5–15)
BUN: 26 mg/dL — ABNORMAL HIGH (ref 8–23)
CO2: 23 mmol/L (ref 22–32)
Calcium: 9.6 mg/dL (ref 8.9–10.3)
Chloride: 104 mmol/L (ref 98–111)
Creatinine: 1.06 mg/dL (ref 0.61–1.24)
GFR, Estimated: >60 mL/min (ref >60)
Glucose, Bld: 255 mg/dL — ABNORMAL HIGH (ref 70–99)
Potassium: 4.2 mmol/L (ref 3.5–5.1)
Sodium: 138 mmol/L (ref 135–145)
Total Bilirubin: 0.3 mg/dL (ref 0.3–1.2)
Total Protein: 6.4 g/dL — ABNORMAL LOW (ref 6.5–8.1)

## 2021-03-07 LAB — IRON AND TIBC
Iron: 51 ug/dL (ref 42–163)
Saturation Ratios: 18 % — ABNORMAL LOW (ref 20–55)
TIBC: 278 ug/dL (ref 202–409)
UIBC: 226 ug/dL (ref 117–376)

## 2021-03-07 LAB — FERRITIN: Ferritin: 798 ng/mL — ABNORMAL HIGH (ref 24–336)

## 2021-03-07 MED ORDER — DEXTROSE 5 % IV SOLN
Freq: Once | INTRAVENOUS | Status: AC
Start: 2021-03-07 — End: 2021-03-07
  Filled 2021-03-07: qty 250

## 2021-03-07 MED ORDER — SODIUM CHLORIDE 0.9% FLUSH
10.0000 mL | INTRAVENOUS | Status: DC | PRN
  Administered 2021-03-07: 10 mL
  Filled 2021-03-07: qty 10

## 2021-03-07 MED ORDER — DIPHENHYDRAMINE HCL 25 MG PO CAPS
ORAL_CAPSULE | ORAL | Status: AC
  Filled 2021-03-07: qty 2

## 2021-03-07 MED ORDER — ACETAMINOPHEN 325 MG PO TABS
ORAL_TABLET | ORAL | Status: AC
  Filled 2021-03-07: qty 2

## 2021-03-07 MED ORDER — HEPARIN SOD (PORK) LOCK FLUSH 100 UNIT/ML IV SOLN
500.0000 [IU] | Freq: Once | INTRAVENOUS | Status: AC | PRN
  Administered 2021-03-07: 500 [IU]
  Filled 2021-03-07: qty 5

## 2021-03-07 MED ORDER — ACETAMINOPHEN 325 MG PO TABS
650.0000 mg | ORAL_TABLET | Freq: Once | ORAL | Status: AC
  Administered 2021-03-07: 650 mg via ORAL

## 2021-03-07 MED ORDER — PALONOSETRON HCL INJECTION 0.25 MG/5ML
INTRAVENOUS | Status: AC
  Filled 2021-03-07: qty 5

## 2021-03-07 MED ORDER — FAM-TRASTUZUMAB DERUXTECAN-NXKI CHEMO 100 MG IV SOLR
6.2500 mg/kg | Freq: Once | INTRAVENOUS | Status: AC
  Administered 2021-03-07: 400 mg via INTRAVENOUS
  Filled 2021-03-07: qty 20

## 2021-03-07 MED ORDER — SODIUM CHLORIDE 0.9 % IV SOLN
10.0000 mg | Freq: Once | INTRAVENOUS | Status: AC
  Administered 2021-03-07: 10 mg via INTRAVENOUS
  Filled 2021-03-07: qty 10

## 2021-03-07 MED ORDER — DIPHENHYDRAMINE HCL 25 MG PO CAPS
50.0000 mg | ORAL_CAPSULE | Freq: Once | ORAL | Status: AC
  Administered 2021-03-07: 50 mg via ORAL

## 2021-03-07 MED ORDER — PALONOSETRON HCL INJECTION 0.25 MG/5ML
0.2500 mg | Freq: Once | INTRAVENOUS | Status: AC
Start: 2021-03-07 — End: 2021-03-07
  Administered 2021-03-07: 0.25 mg via INTRAVENOUS

## 2021-03-07 NOTE — Telephone Encounter (Signed)
Appts made per 03/07/21 los and to gain a new sch in tx/avs and through [employer]

## 2021-03-07 NOTE — Progress Notes (Signed)
Hematology and Oncology Follow Up Visit  Jimmy Blankenship [id number] 1950/10/22 70 y.o. 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  He is doing well and states that he is feeling much better.  He is tolerating Xarelto nicely and has stopped the Plavix as instructed. No bleeding, bruising or petechiae.  No fever, chills, n/v, cough, rash, dizziness, SOB, chest pain, palpitations, abdominal pain or changes in bowel or bladder habits.  No swelling or tenderness in his extremities.  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  No falls or syncope to report.  His appetite has improved significantly and he is staying well hydrated. His weight is stable at 142 lbs.   ECOG Performance Status: 1 - Symptomatic but completely ambulatory  Medications:  Allergies as of 03/07/2021   No Known Allergies      Medication List        Accurate as of March 07, 2021  8:27 AM. If you have any questions, ask your nurse or doctor.          atorvastatin 80 MG tablet Commonly known as: LIPITOR Take 1 tablet (80 mg total) by mouth daily.   canagliflozin 100 MG Tabs tablet Commonly known as: INVOKANA Take 100 mg by mouth daily.   clopidogrel 75 MG tablet Commonly known as: PLAVIX Take 75 mg by mouth daily.   cyclobenzaprine 7.5 MG tablet Commonly known as: FEXMID Take 7.5 mg by mouth at bedtime.   dexamethasone 4 MG tablet Commonly known as:  DECADRON Take 2 tablets (8 mg total) by mouth daily. Start the day after chemotherapy for 2 days.   feeding supplement Liqd Take 237 mLs by mouth 2 (two) times daily between meals.   gabapentin 300 MG capsule Commonly known as: NEURONTIN Take 1 capsule (300 mg total) by mouth 2 (two) times daily.   glimepiride 2 MG tablet Commonly known as: Amaryl Take 1 tablet (2 mg total) by mouth 2 (two) times daily.   lidocaine-prilocaine cream Commonly known as: EMLA Apply to affected area once   LORazepam 0.5 MG tablet Commonly known as: Ativan Take 1 tablet (0.5 mg total) by mouth every 6 (six) hours as needed (Nausea or vomiting).   megestrol 400 MG/10ML suspension Commonly known as: MEGACE Take 10 mLs (400 mg total) by mouth 2 (two) times daily.   metFORMIN 1000 MG tablet Commonly known as: GLUCOPHAGE Take 1,000 mg by mouth 2 (two) times daily with a meal.   metoprolol tartrate 50 MG tablet Commonly known as: LOPRESSOR TAKE ONE TABLET BY MOUTH TWICE A DAY   ondansetron 8 MG tablet Commonly known as: Zofran Take 1 tablet (8 mg total) by mouth 2 (two) times daily as needed for refractory nausea / vomiting. Start on day 3 after chemo.   pantoprazole 40 MG tablet Commonly known as: PROTONIX Take 1 tablet (40 mg total) by mouth 2 (two) times daily.  prochlorperazine 10 MG tablet Commonly known as: COMPAZINE Take 1 tablet (10 mg total) by mouth every 6 (six) hours as needed (Nausea or vomiting).   VITAMIN B-12 PO Take 1 tablet by mouth daily.   CYANOCOBALAMIN SL Take by mouth.   Xarelto Starter Pack Generic drug: Rivaroxaban Stater Pack (15 mg and 20 mg) Follow package directions: Take one 32m tablet by mouth twice a day. On day 22, switch to one 250mtablet once a day. Take with food.        Allergies: No Known Allergies  Past Medical History, Surgical history, Social history, and Family History were reviewed and updated.  Review of Systems: All other 10 point  review of systems is negative.   Physical Exam:  vitals were not taken for this visit.   Wt Readings from Last 3 Encounters:  03/02/21 143 lb (64.9 kg)  02/21/21 129 lb 4 oz (58.6 kg)  02/20/21 130 lb (59 kg)    Ocular: Sclerae unicteric, pupils equal, round and reactive to light Ear-nose-throat: Oropharynx clear, dentition fair Lymphatic: No cervical or supraclavicular adenopathy Lungs no rales or rhonchi, good excursion bilaterally Heart regular rate and rhythm, no murmur appreciated Abd soft, nontender, positive bowel sounds MSK no focal spinal tenderness, no joint edema Neuro: non-focal, well-oriented, appropriate affect Breasts: Deferred   Lab Results  Component Value Date   WBC 9.1 03/07/2021   HGB 10.1 (L) 03/07/2021   HCT 31.5 (L) 03/07/2021   MCV 91.6 03/07/2021   PLT 215 03/07/2021   Lab Results  Component Value Date   FERRITIN 758 (H) 03/02/2021   IRON 71 03/02/2021   TIBC 284 03/02/2021   UIBC 213 03/02/2021   IRONPCTSAT 25 03/02/2021   Lab Results  Component Value Date   RETICCTPCT 2.6 03/02/2021   RBC 3.44 (L) 03/07/2021   No results found for: KPAFRELGTCHN, LAMBDASER, KAPLAMBRATIO No results found for: IGGSERUM, IGA, IGMSERUM No results found for: TOOdetta PinkSPEI   Chemistry      Component Value Date/Time   NA 138 03/07/2021 0736   NA 140 06/10/2020 0935   K 4.2 03/07/2021 0736   CL 104 03/07/2021 0736   CO2 23 03/07/2021 0736   BUN 26 (H) 03/07/2021 0736   BUN 17 06/10/2020 0935   CREATININE 1.06 03/07/2021 0736      Component Value Date/Time   CALCIUM 9.6 03/07/2021 0736   ALKPHOS 80 03/07/2021 0736   AST 16 03/07/2021 0736   ALT 16 03/07/2021 0736   BILITOT 0.3 03/07/2021 0736       Impression and Plan: Jimmy Blankenship a very pleasant [age] Jimmy Blankenship with metastatic adenocarcinoma of the stomach. He has completed 7 cycl;es of chemotherapy along with immunotherapy and  targeted therapy.  He was diagnosed last week with a ventricular thrombus and is tolerating treatment with Xarelto nicely so far.  I discussed today's lab results and last weeks ECHO with Dr. EnMarin Blankenship will proceed with cycle 2 of Enhertu today as planned.  Iron studies are pending. We will replace if needed.  Follow-up in 3 weeks.  They can contact our office with any questions or concerns.   SaLaverna PeaceNP 6/28/20228:27 AM

## 2021-03-08 ENCOUNTER — Telehealth: Payer: Self-pay | Admitting: *Deleted

## 2021-03-08 NOTE — Telephone Encounter (Signed)
Per scheduling message (2) doses of IV Iron - called patient's daughter to give upcoming appointments - confirmed

## 2021-03-09 ENCOUNTER — Other Ambulatory Visit: Payer: Self-pay

## 2021-03-09 ENCOUNTER — Inpatient Hospital Stay: Payer: Medicare Other

## 2021-03-09 VITALS — BP 147/73 | HR 78 | Temp 97.9°F

## 2021-03-09 DIAGNOSIS — D500 Iron deficiency anemia secondary to blood loss (chronic): Secondary | ICD-10-CM

## 2021-03-09 DIAGNOSIS — Z5112 Encounter for antineoplastic immunotherapy: Secondary | ICD-10-CM | POA: Diagnosis not present

## 2021-03-09 DIAGNOSIS — K922 Gastrointestinal hemorrhage, unspecified: Secondary | ICD-10-CM

## 2021-03-09 MED ORDER — SODIUM CHLORIDE 0.9 % IV SOLN
200.0000 mg | Freq: Once | INTRAVENOUS | Status: AC
  Administered 2021-03-09: 200 mg via INTRAVENOUS
  Filled 2021-03-09: qty 200

## 2021-03-09 MED ORDER — SODIUM CHLORIDE 0.9% FLUSH
10.0000 mL | Freq: Once | INTRAVENOUS | Status: AC | PRN
  Administered 2021-03-09: 10 mL
  Filled 2021-03-09: qty 10

## 2021-03-09 MED ORDER — SODIUM CHLORIDE 0.9 % IV SOLN
Freq: Once | INTRAVENOUS | Status: AC
Start: 2021-03-09 — End: 2021-03-09
  Filled 2021-03-09: qty 250

## 2021-03-09 MED ORDER — HEPARIN SOD (PORK) LOCK FLUSH 100 UNIT/ML IV SOLN
500.0000 [IU] | Freq: Once | INTRAVENOUS | Status: AC | PRN
  Administered 2021-03-09: 500 [IU]
  Filled 2021-03-09: qty 5

## 2021-03-09 NOTE — Patient Instructions (Signed)

## 2021-03-16 ENCOUNTER — Inpatient Hospital Stay: Payer: Medicare Other | Attending: Hematology & Oncology

## 2021-03-16 ENCOUNTER — Other Ambulatory Visit: Payer: Self-pay

## 2021-03-16 VITALS — BP 124/70 | HR 99 | Temp 98.2°F | Resp 17

## 2021-03-16 DIAGNOSIS — Z79899 Other long term (current) drug therapy: Secondary | ICD-10-CM | POA: Insufficient documentation

## 2021-03-16 DIAGNOSIS — Z5112 Encounter for antineoplastic immunotherapy: Secondary | ICD-10-CM | POA: Insufficient documentation

## 2021-03-16 DIAGNOSIS — D500 Iron deficiency anemia secondary to blood loss (chronic): Secondary | ICD-10-CM

## 2021-03-16 DIAGNOSIS — R531 Weakness: Secondary | ICD-10-CM | POA: Insufficient documentation

## 2021-03-16 DIAGNOSIS — R5383 Other fatigue: Secondary | ICD-10-CM | POA: Insufficient documentation

## 2021-03-16 DIAGNOSIS — R197 Diarrhea, unspecified: Secondary | ICD-10-CM | POA: Diagnosis not present

## 2021-03-16 DIAGNOSIS — C165 Malignant neoplasm of lesser curvature of stomach, unspecified: Secondary | ICD-10-CM | POA: Diagnosis present

## 2021-03-16 DIAGNOSIS — C787 Secondary malignant neoplasm of liver and intrahepatic bile duct: Secondary | ICD-10-CM | POA: Insufficient documentation

## 2021-03-16 DIAGNOSIS — K922 Gastrointestinal hemorrhage, unspecified: Secondary | ICD-10-CM | POA: Diagnosis not present

## 2021-03-16 DIAGNOSIS — C799 Secondary malignant neoplasm of unspecified site: Secondary | ICD-10-CM

## 2021-03-16 DIAGNOSIS — Z7901 Long term (current) use of anticoagulants: Secondary | ICD-10-CM | POA: Insufficient documentation

## 2021-03-16 DIAGNOSIS — G629 Polyneuropathy, unspecified: Secondary | ICD-10-CM | POA: Insufficient documentation

## 2021-03-16 DIAGNOSIS — I219 Acute myocardial infarction, unspecified: Secondary | ICD-10-CM | POA: Insufficient documentation

## 2021-03-16 MED ORDER — SODIUM CHLORIDE 0.9% FLUSH
10.0000 mL | Freq: Once | INTRAVENOUS | Status: AC
Start: 2021-03-16 — End: 2021-03-16
  Administered 2021-03-16: 10 mL via INTRAVENOUS
  Filled 2021-03-16: qty 10

## 2021-03-16 MED ORDER — DIPHENOXYLATE-ATROPINE 2.5-0.025 MG PO TABS: 1.0000 | ORAL_TABLET | Freq: Four times a day (QID) | ORAL | 0 refills | Status: DC | PRN

## 2021-03-16 MED ORDER — HEPARIN SOD (PORK) LOCK FLUSH 100 UNIT/ML IV SOLN
500.0000 [IU] | Freq: Once | INTRAVENOUS | Status: AC
Start: 2021-03-16 — End: 2021-03-16
  Administered 2021-03-16: 500 [IU] via INTRAVENOUS
  Filled 2021-03-16: qty 5

## 2021-03-16 MED ORDER — SODIUM CHLORIDE 0.9 % IV SOLN
200.0000 mg | Freq: Once | INTRAVENOUS | Status: AC
  Administered 2021-03-16: 200 mg via INTRAVENOUS
  Filled 2021-03-16: qty 200

## 2021-03-16 MED ORDER — SODIUM CHLORIDE 0.9 % IV SOLN
Freq: Once | INTRAVENOUS | Status: AC
  Filled 2021-03-16: qty 250

## 2021-03-16 NOTE — Patient Instructions (Signed)

## 2021-03-27 ENCOUNTER — Other Ambulatory Visit: Payer: Self-pay | Admitting: Hematology & Oncology

## 2021-03-27 MED ORDER — RIVAROXABAN 20 MG PO TABS: 20.0000 mg | ORAL_TABLET | Freq: Every day | ORAL | 3 refills | Status: DC

## 2021-03-28 ENCOUNTER — Other Ambulatory Visit (HOSPITAL_BASED_OUTPATIENT_CLINIC_OR_DEPARTMENT_OTHER): Payer: Self-pay

## 2021-03-28 ENCOUNTER — Other Ambulatory Visit (HOSPITAL_COMMUNITY): Payer: Self-pay

## 2021-03-28 ENCOUNTER — Telehealth: Payer: Self-pay | Admitting: Pharmacy Technician

## 2021-03-28 ENCOUNTER — Encounter: Payer: Self-pay | Admitting: Hematology & Oncology

## 2021-03-28 ENCOUNTER — Encounter: Payer: Self-pay | Admitting: Family

## 2021-03-28 NOTE — Telephone Encounter (Signed)
Oral Oncology Patient Advocate Encounter  Received a message from Jimmy Blankenship, Dr Jimmy Blankenship nurse, regarding this patients Xarelto copay.  Patients daughter, Jimmy Blankenship, called to state that the Xarelto copay was $120 for a 30 day supply.  I called Jimmy Blankenship pharmacy to see if a deductible amount was included in the copay.  The pharmacy stated that their was a deductible included in the copay.    I submitted a test claim for Xarelto and it revealed a $47 copay after meeting the deductible.  I called and spoke to Jimmy Blankenship to let her know the copay wouldn't be $120 every month, just this one because of the deductible. The copay will be $47 copay going forward and that is affordable per Jimmy Blankenship.  I told her to call if it became unaffordable and we could apply for assistance.  [place] Patient [place] Phone [phone] Fax [phone] 03/28/2021 3:17 PM

## 2021-03-29 ENCOUNTER — Other Ambulatory Visit: Payer: Self-pay

## 2021-03-29 ENCOUNTER — Inpatient Hospital Stay: Payer: Medicare Other

## 2021-03-29 ENCOUNTER — Encounter: Payer: Self-pay | Admitting: Family

## 2021-03-29 ENCOUNTER — Inpatient Hospital Stay (HOSPITAL_BASED_OUTPATIENT_CLINIC_OR_DEPARTMENT_OTHER): Payer: Medicare Other | Admitting: Family

## 2021-03-29 VITALS — BP 108/61 | HR 72 | Temp 98.8°F | Resp 18 | Ht 69.0 in | Wt 140.0 lb

## 2021-03-29 DIAGNOSIS — D649 Anemia, unspecified: Secondary | ICD-10-CM | POA: Diagnosis not present

## 2021-03-29 DIAGNOSIS — C799 Secondary malignant neoplasm of unspecified site: Secondary | ICD-10-CM

## 2021-03-29 DIAGNOSIS — Z5112 Encounter for antineoplastic immunotherapy: Secondary | ICD-10-CM | POA: Diagnosis not present

## 2021-03-29 DIAGNOSIS — C165 Malignant neoplasm of lesser curvature of stomach, unspecified: Secondary | ICD-10-CM

## 2021-03-29 DIAGNOSIS — I513 Intracardiac thrombosis, not elsewhere classified: Secondary | ICD-10-CM

## 2021-03-29 DIAGNOSIS — C169 Malignant neoplasm of stomach, unspecified: Secondary | ICD-10-CM

## 2021-03-29 DIAGNOSIS — D500 Iron deficiency anemia secondary to blood loss (chronic): Secondary | ICD-10-CM

## 2021-03-29 LAB — CBC WITH DIFFERENTIAL (CANCER CENTER ONLY)
Abs Immature Granulocytes: 0.38 10*3/uL — ABNORMAL HIGH (ref 0.00–0.07)
Basophils Absolute: 0 10*3/uL (ref 0.0–0.1)
Basophils Relative: 1 %
Eosinophils Absolute: 0.1 10*3/uL (ref 0.0–0.5)
Eosinophils Relative: 1 %
HCT: 25 % — ABNORMAL LOW (ref 39.0–52.0)
Hemoglobin: 8 g/dL — ABNORMAL LOW (ref 13.0–17.0)
Immature Granulocytes: 6 %
Lymphocytes Relative: 18 %
Lymphs Abs: 1.1 10*3/uL (ref 0.7–4.0)
MCH: 29.4 pg (ref 26.0–34.0)
MCHC: 32 g/dL (ref 30.0–36.0)
MCV: 91.9 fL (ref 80.0–100.0)
Monocytes Absolute: 0.5 10*3/uL (ref 0.1–1.0)
Monocytes Relative: 7 %
Neutro Abs: 4.1 10*3/uL (ref 1.7–7.7)
Neutrophils Relative %: 67 %
Platelet Count: 242 10*3/uL (ref 150–400)
RBC: 2.72 MIL/uL — ABNORMAL LOW (ref 4.22–5.81)
RDW: 15.8 % — ABNORMAL HIGH (ref 11.5–15.5)
WBC Count: 6.1 10*3/uL (ref 4.0–10.5)
nRBC: 0.5 % — ABNORMAL HIGH (ref 0.0–0.2)

## 2021-03-29 LAB — PREALBUMIN: Prealbumin: 15.5 mg/dL — ABNORMAL LOW (ref 18–38)

## 2021-03-29 LAB — CMP (CANCER CENTER ONLY)
ALT: 50 U/L — ABNORMAL HIGH (ref 0–44)
AST: 18 U/L (ref 15–41)
Albumin: 3.1 g/dL — ABNORMAL LOW (ref 3.5–5.0)
Alkaline Phosphatase: 75 U/L (ref 38–126)
Anion gap: 8 (ref 5–15)
BUN: 17 mg/dL (ref 8–23)
CO2: 24 mmol/L (ref 22–32)
Calcium: 9.2 mg/dL (ref 8.9–10.3)
Chloride: 104 mmol/L (ref 98–111)
Creatinine: 0.76 mg/dL (ref 0.61–1.24)
GFR, Estimated: >60 mL/min (ref >60)
Glucose, Bld: 281 mg/dL — ABNORMAL HIGH (ref 70–99)
Potassium: 3.6 mmol/L (ref 3.5–5.1)
Sodium: 136 mmol/L (ref 135–145)
Total Bilirubin: 0.4 mg/dL (ref 0.3–1.2)
Total Protein: 5.8 g/dL — ABNORMAL LOW (ref 6.5–8.1)

## 2021-03-29 LAB — RETICULOCYTES
Immature Retic Fract: 41.3 % — ABNORMAL HIGH (ref 2.3–15.9)
RBC.: 2.67 MIL/uL — ABNORMAL LOW (ref 4.22–5.81)
Retic Count, Absolute: 76.1 10*3/uL (ref 19.0–186.0)
Retic Ct Pct: 2.9 % (ref 0.4–3.1)

## 2021-03-29 LAB — IRON AND TIBC
Iron: 55 ug/dL (ref 45–182)
Saturation Ratios: 26 % (ref 17.9–39.5)
TIBC: 211 ug/dL — ABNORMAL LOW (ref 250–450)
UIBC: 156 ug/dL

## 2021-03-29 LAB — FERRITIN: Ferritin: 1016 ng/mL — ABNORMAL HIGH (ref 24–336)

## 2021-03-29 LAB — PREPARE RBC (CROSSMATCH)

## 2021-03-29 MED ORDER — PALONOSETRON HCL INJECTION 0.25 MG/5ML
0.2500 mg | Freq: Once | INTRAVENOUS | Status: AC
  Administered 2021-03-29: 0.25 mg via INTRAVENOUS

## 2021-03-29 MED ORDER — SODIUM CHLORIDE 0.9% FLUSH
10.0000 mL | INTRAVENOUS | Status: DC | PRN
  Administered 2021-03-29: 10 mL
  Filled 2021-03-29: qty 10

## 2021-03-29 MED ORDER — DEXAMETHASONE SODIUM PHOSPHATE 100 MG/10ML IJ SOLN
10.0000 mg | Freq: Once | INTRAMUSCULAR | Status: AC
  Administered 2021-03-29: 10 mg via INTRAVENOUS
  Filled 2021-03-29: qty 10

## 2021-03-29 MED ORDER — ACETAMINOPHEN 325 MG PO TABS
650.0000 mg | ORAL_TABLET | Freq: Once | ORAL | Status: AC
  Administered 2021-03-29: 650 mg via ORAL

## 2021-03-29 MED ORDER — HEPARIN SOD (PORK) LOCK FLUSH 100 UNIT/ML IV SOLN
500.0000 [IU] | Freq: Once | INTRAVENOUS | Status: AC | PRN
Start: 2021-03-29 — End: 2021-03-29
  Administered 2021-03-29: 500 [IU]
  Filled 2021-03-29: qty 5

## 2021-03-29 MED ORDER — DIPHENHYDRAMINE HCL 25 MG PO CAPS
50.0000 mg | ORAL_CAPSULE | Freq: Once | ORAL | Status: AC
  Administered 2021-03-29: 50 mg via ORAL

## 2021-03-29 MED ORDER — DEXTROSE 5 % IV SOLN
Freq: Once | INTRAVENOUS | Status: AC
  Filled 2021-03-29: qty 250

## 2021-03-29 MED ORDER — FAM-TRASTUZUMAB DERUXTECAN-NXKI CHEMO 100 MG IV SOLR
6.2500 mg/kg | Freq: Once | INTRAVENOUS | Status: AC
  Administered 2021-03-29: 400 mg via INTRAVENOUS
  Filled 2021-03-29: qty 20

## 2021-03-29 MED ORDER — ACETAMINOPHEN 325 MG PO TABS
ORAL_TABLET | ORAL | Status: AC
  Filled 2021-03-29: qty 2

## 2021-03-29 MED ORDER — PALONOSETRON HCL INJECTION 0.25 MG/5ML
INTRAVENOUS | Status: AC
  Filled 2021-03-29: qty 5

## 2021-03-29 MED ORDER — DIPHENHYDRAMINE HCL 25 MG PO CAPS
ORAL_CAPSULE | ORAL | Status: AC
  Filled 2021-03-29: qty 2

## 2021-03-29 MED ORDER — RIVAROXABAN 10 MG PO TABS: 10.0000 mg | ORAL_TABLET | Freq: Every day | ORAL | 4 refills | Status: DC

## 2021-03-29 NOTE — Progress Notes (Signed)
Reviewed plan for tomorrow with patient. Pt aware he will receive 2 units of blood tomorrow. Pt blue bracelet put on wrist and patient instructed to keep bracelet on. Pt given AVS and this RN hand wrote all instructions for family. Pt verbalized understanding and had no further questions.

## 2021-03-29 NOTE — Patient Instructions (Signed)
[facility]  Discharge Instructions: Thank you for choosing [facility] to provide your oncology and hematology care.   If you have a lab appointment with the Cancer Center, please go directly to the Cancer Center and check in at the registration area.  Wear comfortable clothing and clothing appropriate for easy access to any Portacath or PICC line.   We strive to give you quality time with your provider. You may need to reschedule your appointment if you arrive late (15 or more minutes).  Arriving late affects you and other patients whose appointments are after yours.  Also, if you miss three or more appointments without notifying the office, you may be dismissed from the clinic at the provider's discretion.      For prescription refill requests, have your pharmacy contact our office and allow 72 hours for refills to be completed.    Today you received the following chemotherapy and/or immunotherapy agents Enhertu      To help prevent nausea and vomiting after your treatment, we encourage you to take your nausea medication as directed.  BELOW ARE SYMPTOMS THAT SHOULD BE REPORTED IMMEDIATELY: *FEVER GREATER THAN 100.4 F (38 C) OR HIGHER *CHILLS OR SWEATING *NAUSEA AND VOMITING THAT IS NOT CONTROLLED WITH YOUR NAUSEA MEDICATION *UNUSUAL SHORTNESS OF BREATH *UNUSUAL BRUISING OR BLEEDING *URINARY PROBLEMS (pain or burning when urinating, or frequent urination) *BOWEL PROBLEMS (unusual diarrhea, constipation, pain near the anus) TENDERNESS IN MOUTH AND THROAT WITH OR WITHOUT PRESENCE OF ULCERS (sore throat, sores in mouth, or a toothache) UNUSUAL RASH, SWELLING OR PAIN  UNUSUAL VAGINAL DISCHARGE OR ITCHING   Items with * indicate a potential emergency and should be followed up as soon as possible or go to the Emergency Department if any problems should occur.  Please show the CHEMOTHERAPY ALERT CARD or IMMUNOTHERAPY ALERT CARD at check-in to the  Emergency Department and triage nurse. Should you have questions after your visit or need to cancel or reschedule your appointment, please contact [facility]  [phone] and follow the prompts.  Office hours are 8:00 a.m. to 4:30 p.m. Monday - Friday. Please note that voicemails left after 4:00 p.m. may not be returned until the following business day.  We are closed weekends and major holidays. You have access to a nurse at all times for urgent questions. Please call the main number to the clinic [phone] and follow the prompts.  For any non-urgent questions, you may also contact your provider using MyChart. We now offer e-Visits for anyone 18 and older to request care online for non-urgent symptoms. For details visit [email or web address].   Also download the MyChart app! Go to the app store, search "MyChart", open the app, select [facility], and log in with your MyChart username and password.  Due to Covid, a mask is required upon entering the hospital/clinic. If you do not have a mask, one will be given to you upon arrival. For doctor visits, patients may have 1 support person aged 18 or older with them. For treatment visits, patients cannot have anyone with them due to current Covid guidelines and our immunocompromised population.  

## 2021-03-29 NOTE — Progress Notes (Signed)
Hematology and Oncology Follow Up Visit  Jimmy Blankenship [id number] [birth year] 71 y.o. 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 He is symptomatic with fatigue and weakness at times.  He notes that his stool has been a little darker but not black. He has been having frequent diarrhea 3-4 days after each cycle of treatment. He takes Lomotil which helps relieve his symptoms.  No obvious blood loss noted. No bruising or petechiae.  He is currently on Xarelto 20 mg PO daily for the ventricular thrombus. Digital rectal exam performed with Jimmy Levine RN as chaperone but unable to get a sufficient sample to test for occult blood.  No fever, chills, n/v, cough, rash, dizziness, SOB, chest pain, palpitations, abdominal pain or changes in bladder habits.  No swelling or tenderness in his extremities.  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 He states that he has had a good appetite and is staying well hydrated. His weight is stable at 140 lbs.   ECOG Performance Status: 1 - Symptomatic but completely ambulatory  Medications:  Allergies as of 03/29/2021   No Known Allergies      Medication List        Accurate as of March 29, 2021 10:28 AM. If you have any questions, ask your nurse or doctor.          atorvastatin 80 MG tablet Commonly known as: LIPITOR Take 1 tablet (80 mg total) by mouth daily.   canagliflozin 100 MG Tabs  tablet Commonly known as: INVOKANA Take 100 mg by mouth daily.   dexamethasone 4 MG tablet Commonly known as: DECADRON Take 2 tablets (8 mg total) by mouth daily. Start the day after chemotherapy for 2 days.   diphenoxylate-atropine 2.5-0.025 MG tablet Commonly known as: LOMOTIL Take 1 tablet by mouth 4 (four) times daily as needed for diarrhea or loose stools.   feeding supplement Liqd Take 237 mLs by mouth 2 (two) times daily between meals.   gabapentin 300 MG capsule Commonly known as: NEURONTIN Take 1 capsule (300 mg total) by mouth 2 (two) times daily.   glimepiride 2 MG tablet Commonly known as: Amaryl Take 1 tablet (2 mg total) by mouth 2 (two) times daily.   lidocaine-prilocaine cream Commonly known as: EMLA Apply to affected area once   LORazepam 0.5 MG tablet Commonly known as: Ativan Take 1 tablet (0.5 mg total) by mouth every 6 (six) hours as needed (Nausea or vomiting).   megestrol 400 MG/10ML suspension Commonly known as: MEGACE Take 10 mLs (400 mg total) by mouth 2 (two) times daily.   metFORMIN 1000 MG tablet Commonly known as: GLUCOPHAGE Take 1,000 mg by mouth 2 (two) times daily with a meal.   metoprolol tartrate 50 MG tablet Commonly known as: LOPRESSOR TAKE ONE TABLET BY MOUTH TWICE A DAY   ondansetron 8 MG tablet Commonly known as: Zofran Take 1 tablet (8 mg total) by mouth 2 (  two) times daily as needed for refractory nausea / vomiting. Start on day 3 after chemo.   pantoprazole 40 MG tablet Commonly known as: PROTONIX Take 1 tablet (40 mg total) by mouth 2 (two) times daily.   prochlorperazine 10 MG tablet Commonly known as: COMPAZINE Take 1 tablet (10 mg total) by mouth every 6 (six) hours as needed (Nausea or vomiting).   VITAMIN B-12 PO Take 1 tablet by mouth daily.   CYANOCOBALAMIN SL Take by mouth.   Xarelto Starter Pack Generic drug: Rivaroxaban Stater Pack (15 mg and 20 mg) Follow package directions: Take one 56m tablet by  mouth twice a day. On day 22, switch to one 256mtablet once a day. Take with food.   rivaroxaban 20 MG Tabs tablet Commonly known as: XARELTO Take 1 tablet (20 mg total) by mouth daily with supper.        Allergies: No Known Allergies  Past Medical History, Surgical history, Social history, and Family History were reviewed and updated.  Review of Systems: All other 10 point review of systems is negative.   Physical Exam:  vitals were not taken for this visit.   Wt Readings from Last 3 Encounters:  03/07/21 142 lb (64.4 kg)  03/02/21 143 lb (64.9 kg)  02/21/21 129 lb 4 oz (58.6 kg)    Ocular: Sclerae unicteric, pupils equal, round and reactive to light Ear-nose-throat: Oropharynx clear, dentition fair Lymphatic: No cervical or supraclavicular adenopathy Lungs no rales or rhonchi, good excursion bilaterally Heart regular rate and rhythm, no murmur appreciated Abd soft, nontender, positive bowel sounds MSK no focal spinal tenderness, no joint edema Neuro: non-focal, well-oriented, appropriate affect Breasts: Deferred   Lab Results  Component Value Date   WBC 6.1 03/29/2021   HGB 8.0 (L) 03/29/2021   HCT 25.0 (L) 03/29/2021   MCV 91.9 03/29/2021   PLT 242 03/29/2021   Lab Results  Component Value Date   FERRITIN 798 (H) 03/07/2021   IRON 51 03/07/2021   TIBC 278 03/07/2021   UIBC 226 03/07/2021   IRONPCTSAT 18 (L) 03/07/2021   Lab Results  Component Value Date   RETICCTPCT 2.9 03/29/2021   RBC 2.67 (L) 03/29/2021   RBC 2.72 (L) 03/29/2021   No results found for: KPAFRELGTCHN, LAMBDASER, KAPLAMBRATIO No results found for: IGGSERUM, IGA, IGMSERUM No results found for: TOOdetta PinkSPEI   Chemistry      Component Value Date/Time   NA 138 03/07/2021 0736   NA 140 06/10/2020 0935   K 4.2 03/07/2021 0736   CL 104 03/07/2021 0736   CO2 23 03/07/2021 0736   BUN 26 (H) 03/07/2021 0736   BUN 17 06/10/2020  0935   CREATININE 1.06 03/07/2021 0736      Component Value Date/Time   CALCIUM 9.6 03/07/2021 0736   ALKPHOS 80 03/07/2021 0736   AST 16 03/07/2021 0736   ALT 16 03/07/2021 0736   BILITOT 0.3 03/07/2021 0736       Impression and Plan: Jimmy Blankenship a very pleasant [age]o [place] with metastatic adenocarcinoma of the stomach. He has completed 7 cycl;es of chemotherapy along with immunotherapy and targeted therapy. He was recently diagnosed with a ventricular thrombus and is currently on full dose Xarelto.  I spoke with Dr. EnMarin Olpegarding patients current symptoms, lab work and anemia. We will get him set up for 2 units of PRBC's tomorrow and reduce his XArelto to 10 mg PO daily. I spoke  with his daughter and she verbalized understanding. They will pick up his new prescription later today.   We will proceed with treatment today per MD. Iron studies are pending. We will replace if needed.  Follow-up in 3 weeks They can contact our office with any questions or concerns.   Laverna Peace, NP 7/20/202210:28 AM

## 2021-03-30 ENCOUNTER — Inpatient Hospital Stay: Payer: Medicare Other

## 2021-03-30 ENCOUNTER — Telehealth: Payer: Self-pay | Admitting: *Deleted

## 2021-03-30 DIAGNOSIS — D649 Anemia, unspecified: Secondary | ICD-10-CM

## 2021-03-30 DIAGNOSIS — Z5112 Encounter for antineoplastic immunotherapy: Secondary | ICD-10-CM | POA: Diagnosis not present

## 2021-03-30 DIAGNOSIS — C165 Malignant neoplasm of lesser curvature of stomach, unspecified: Secondary | ICD-10-CM

## 2021-03-30 DIAGNOSIS — C169 Malignant neoplasm of stomach, unspecified: Secondary | ICD-10-CM

## 2021-03-30 MED ORDER — DIPHENHYDRAMINE HCL 25 MG PO CAPS
ORAL_CAPSULE | ORAL | Status: AC
  Filled 2021-03-30: qty 1

## 2021-03-30 MED ORDER — ACETAMINOPHEN 325 MG PO TABS
ORAL_TABLET | ORAL | Status: AC
  Filled 2021-03-30: qty 2

## 2021-03-30 MED ORDER — SODIUM CHLORIDE 0.9% FLUSH
10.0000 mL | Freq: Once | INTRAVENOUS | Status: AC
  Administered 2021-03-30: 10 mL via INTRAVENOUS
  Filled 2021-03-30: qty 10

## 2021-03-30 MED ORDER — HEPARIN SOD (PORK) LOCK FLUSH 100 UNIT/ML IV SOLN
500.0000 [IU] | Freq: Once | INTRAVENOUS | Status: AC
  Administered 2021-03-30: 500 [IU] via INTRAVENOUS
  Filled 2021-03-30: qty 5

## 2021-03-30 MED ORDER — DIPHENHYDRAMINE HCL 25 MG PO CAPS
25.0000 mg | ORAL_CAPSULE | Freq: Once | ORAL | Status: AC
  Administered 2021-03-30: 25 mg via ORAL

## 2021-03-30 MED ORDER — ACETAMINOPHEN 325 MG PO TABS
650.0000 mg | ORAL_TABLET | Freq: Once | ORAL | Status: AC
  Administered 2021-03-30: 650 mg via ORAL

## 2021-03-30 NOTE — Telephone Encounter (Signed)
Per 03/29/21 los gave patient upcoming appointments with calendar

## 2021-03-30 NOTE — Patient Instructions (Signed)
https://www.redcrossblood.org/donate-blood/blood-donation-process/what-happens-to-donated-blood/blood-transfusions/types-of-blood-transfusions.html"> https://www.hematology.org/education/patients/blood-basics/blood-safety-and-matching"> https://www.nhlbi.nih.gov/health-topics/blood-transfusion">  Blood Transfusion, Adult A blood transfusion is a procedure in which you receive blood or a type of blood cell (blood component) through an IV. You may need a blood transfusion when your blood level is low. This may result from a bleeding disorder, illness, injury, or surgery. The blood may come from a donor. You may also be able to donate blood for yourself (autologous blood donation) before a planned surgery. The blood given in a transfusion is made up of different blood components. You may receive: Red blood cells. These carry oxygen to the cells in the body. Platelets. These help your blood to clot. Plasma. This is the liquid part of your blood. It carries proteins and other substances throughout the body. White blood cells. These help you fight infections. If you have hemophilia or another clotting disorder, you may also receive othertypes of blood products. Tell a health care provider about: Any blood disorders you have. Any previous reactions you have had during a blood transfusion. Any allergies you have. All medicines you are taking, including vitamins, herbs, eye drops, creams, and over-the-counter medicines. Any surgeries you have had. Any medical conditions you have, including any recent fever or cold symptoms. Whether you are pregnant or may be pregnant. What are the risks? Generally, this is a safe procedure. However, problems may occur. The most common problems include: A mild allergic reaction, such as red, swollen areas of skin (hives) and itching. Fever or chills. This may be the body's response to new blood cells received. This may occur during or up to 4 hours after the  transfusion. More serious problems may include: Transfusion-associated circulatory overload (TACO), or too much fluid in the lungs. This may cause breathing problems. A serious allergic reaction, such as difficulty breathing or swelling around the face and lips. Transfusion-related acute lung injury (TRALI), which causes breathing difficulty and low oxygen in the blood. This can occur within hours of the transfusion or several days later. Iron overload. This can happen after receiving many blood transfusions over a period of time. Infection or virus being transmitted. This is rare because donated blood is carefully tested before it is given. Hemolytic transfusion reaction. This is rare. It happens when your body's defense system (immune system)tries to attack the new blood cells. Symptoms may include fever, chills, nausea, low blood pressure, and low back or chest pain. Transfusion-associated graft-versus-host disease (TAGVHD). This is rare. It happens when donated cells attack your body's healthy tissues. What happens before the procedure? Medicines Ask your health care provider about: Changing or stopping your regular medicines. This is especially important if you are taking diabetes medicines or blood thinners. Taking medicines such as aspirin and ibuprofen. These medicines can thin your blood. Do not take these medicines unless your health care provider tells you to take them. Taking over-the-counter medicines, vitamins, herbs, and supplements. General instructions Follow instructions from your health care provider about eating and drinking restrictions. You will have a blood test to determine your blood type. This is necessary to know what kind of blood your body will accept and to match it to the donor blood. If you are going to have a planned surgery, you may be able to do an autologous blood donation. This may be done in case you need to have a transfusion. You will have your temperature,  blood pressure, and pulse monitored before the transfusion. If you have had an allergic reaction to a transfusion in the past, you may be given   medicine to help prevent a reaction. This medicine may be given to you by mouth (orally) or through an IV. Set aside time for the blood transfusion. This procedure generally takes 1-4 hours to complete. What happens during the procedure?  An IV will be inserted into one of your veins. The bag of donated blood will be attached to your IV. The blood will then enter through your vein. Your temperature, blood pressure, and pulse will be monitored regularly during the transfusion. This monitoring is done to detect early signs of a transfusion reaction. Tell your nurse right away if you have any of these symptoms during the transfusion: Shortness of breath or trouble breathing. Chest or back pain. Fever or chills. Hives or itching. If you have any signs or symptoms of a reaction, your transfusion will be stopped and you may be given medicine. When the transfusion is complete, your IV will be removed. Pressure may be applied to the IV site for a few minutes. A bandage (dressing)will be applied. The procedure may vary among health care providers and hospitals. What happens after the procedure? Your temperature, blood pressure, pulse, breathing rate, and blood oxygen level will be monitored until you leave the hospital or clinic. Your blood may be tested to see how you are responding to the transfusion. You may be warmed with fluids or blankets to maintain a normal body temperature. If you receive your blood transfusion in an outpatient setting, you will be told whom to contact to report any reactions. Where to find more information For more information on blood transfusions, visit the American Red Cross: redcross.org Summary A blood transfusion is a procedure in which you receive blood or a type of blood cell (blood component) through an IV. The blood you  receive may come from a donor or be donated by yourself (autologous blood donation) before a planned surgery. The blood given in a transfusion is made up of different blood components. You may receive red blood cells, platelets, plasma, or white blood cells depending on the condition treated. Your temperature, blood pressure, and pulse will be monitored before, during, and after the transfusion. After the transfusion, your blood may be tested to see how your body has responded. This information is not intended to replace advice given to you by your health care provider. Make sure you discuss any questions you have with your healthcare provider. Document Revised: 07/02/2019 Document Reviewed: 02/19/2019 Elsevier Patient Education  2022 Elsevier Inc.  

## 2021-03-31 LAB — BPAM RBC
Blood Product Expiration Date: 202208102359
Blood Product Expiration Date: 202208102359
ISSUE DATE / TIME: 202207210754
ISSUE DATE / TIME: 202207210754
Unit Type and Rh: 6200
Unit Type and Rh: 6200

## 2021-03-31 LAB — TYPE AND SCREEN
ABO/RH(D): A POS
Antibody Screen: NEGATIVE
Unit division: 0
Unit division: 0

## 2021-04-13 ENCOUNTER — Other Ambulatory Visit: Payer: Self-pay | Admitting: Hematology & Oncology

## 2021-04-13 DIAGNOSIS — C169 Malignant neoplasm of stomach, unspecified: Secondary | ICD-10-CM

## 2021-04-19 ENCOUNTER — Encounter: Payer: Self-pay | Admitting: Hematology & Oncology

## 2021-04-19 ENCOUNTER — Inpatient Hospital Stay: Payer: Medicare Other

## 2021-04-19 ENCOUNTER — Inpatient Hospital Stay (HOSPITAL_BASED_OUTPATIENT_CLINIC_OR_DEPARTMENT_OTHER): Payer: Medicare Other | Admitting: Hematology & Oncology

## 2021-04-19 ENCOUNTER — Inpatient Hospital Stay: Payer: Medicare Other | Attending: Hematology & Oncology

## 2021-04-19 ENCOUNTER — Other Ambulatory Visit: Payer: Self-pay

## 2021-04-19 VITALS — BP 115/69 | HR 95 | Temp 98.6°F | Resp 16 | Ht 69.0 in | Wt 140.1 lb

## 2021-04-19 DIAGNOSIS — C165 Malignant neoplasm of lesser curvature of stomach, unspecified: Secondary | ICD-10-CM

## 2021-04-19 DIAGNOSIS — D649 Anemia, unspecified: Secondary | ICD-10-CM

## 2021-04-19 DIAGNOSIS — Z79899 Other long term (current) drug therapy: Secondary | ICD-10-CM | POA: Insufficient documentation

## 2021-04-19 DIAGNOSIS — Z5112 Encounter for antineoplastic immunotherapy: Secondary | ICD-10-CM | POA: Diagnosis not present

## 2021-04-19 DIAGNOSIS — D500 Iron deficiency anemia secondary to blood loss (chronic): Secondary | ICD-10-CM

## 2021-04-19 DIAGNOSIS — C169 Malignant neoplasm of stomach, unspecified: Secondary | ICD-10-CM

## 2021-04-19 DIAGNOSIS — R197 Diarrhea, unspecified: Secondary | ICD-10-CM | POA: Insufficient documentation

## 2021-04-19 DIAGNOSIS — C799 Secondary malignant neoplasm of unspecified site: Secondary | ICD-10-CM

## 2021-04-19 DIAGNOSIS — I513 Intracardiac thrombosis, not elsewhere classified: Secondary | ICD-10-CM

## 2021-04-19 DIAGNOSIS — I219 Acute myocardial infarction, unspecified: Secondary | ICD-10-CM | POA: Diagnosis not present

## 2021-04-19 DIAGNOSIS — C787 Secondary malignant neoplasm of liver and intrahepatic bile duct: Secondary | ICD-10-CM | POA: Diagnosis not present

## 2021-04-19 DIAGNOSIS — K922 Gastrointestinal hemorrhage, unspecified: Secondary | ICD-10-CM | POA: Insufficient documentation

## 2021-04-19 LAB — RETICULOCYTES
Immature Retic Fract: 25.4 % — ABNORMAL HIGH (ref 2.3–15.9)
RBC.: 3.07 MIL/uL — ABNORMAL LOW (ref 4.22–5.81)
Retic Count, Absolute: 105.6 10*3/uL (ref 19.0–186.0)
Retic Ct Pct: 3.4 % — ABNORMAL HIGH (ref 0.4–3.1)

## 2021-04-19 LAB — FERRITIN: Ferritin: 1728 ng/mL — ABNORMAL HIGH (ref 24–336)

## 2021-04-19 LAB — CMP (CANCER CENTER ONLY)
ALT: 19 U/L (ref 0–44)
AST: 17 U/L (ref 15–41)
Albumin: 3.2 g/dL — ABNORMAL LOW (ref 3.5–5.0)
Alkaline Phosphatase: 70 U/L (ref 38–126)
Anion gap: 10 (ref 5–15)
BUN: 11 mg/dL (ref 8–23)
CO2: 22 mmol/L (ref 22–32)
Calcium: 9.1 mg/dL (ref 8.9–10.3)
Chloride: 106 mmol/L (ref 98–111)
Creatinine: 0.8 mg/dL (ref 0.61–1.24)
GFR, Estimated: >60 mL/min (ref >60)
Glucose, Bld: 211 mg/dL — ABNORMAL HIGH (ref 70–99)
Potassium: 3.5 mmol/L (ref 3.5–5.1)
Sodium: 138 mmol/L (ref 135–145)
Total Bilirubin: 0.6 mg/dL (ref 0.3–1.2)
Total Protein: 5.5 g/dL — ABNORMAL LOW (ref 6.5–8.1)

## 2021-04-19 LAB — CBC WITH DIFFERENTIAL (CANCER CENTER ONLY)
Abs Immature Granulocytes: 0.16 10*3/uL — ABNORMAL HIGH (ref 0.00–0.07)
Basophils Absolute: 0.1 10*3/uL (ref 0.0–0.1)
Basophils Relative: 1 %
Eosinophils Absolute: 0.5 10*3/uL (ref 0.0–0.5)
Eosinophils Relative: 6 %
HCT: 29.2 % — ABNORMAL LOW (ref 39.0–52.0)
Hemoglobin: 9.5 g/dL — ABNORMAL LOW (ref 13.0–17.0)
Immature Granulocytes: 2 %
Lymphocytes Relative: 8 %
Lymphs Abs: 0.6 10*3/uL — ABNORMAL LOW (ref 0.7–4.0)
MCH: 30.5 pg (ref 26.0–34.0)
MCHC: 32.5 g/dL (ref 30.0–36.0)
MCV: 93.9 fL (ref 80.0–100.0)
Monocytes Absolute: 0.8 10*3/uL (ref 0.1–1.0)
Monocytes Relative: 9 %
Neutro Abs: 6.2 10*3/uL (ref 1.7–7.7)
Neutrophils Relative %: 74 %
Platelet Count: 139 10*3/uL — ABNORMAL LOW (ref 150–400)
RBC: 3.11 MIL/uL — ABNORMAL LOW (ref 4.22–5.81)
RDW: 15.9 % — ABNORMAL HIGH (ref 11.5–15.5)
WBC Count: 8.3 10*3/uL (ref 4.0–10.5)
nRBC: 0 % (ref 0.0–0.2)

## 2021-04-19 LAB — SAMPLE TO BLOOD BANK

## 2021-04-19 LAB — IRON AND TIBC
Iron: 29 ug/dL — ABNORMAL LOW (ref 42–163)
Saturation Ratios: 13 % — ABNORMAL LOW (ref 20–55)
TIBC: 229 ug/dL (ref 202–409)
UIBC: 200 ug/dL (ref 117–376)

## 2021-04-19 LAB — PREALBUMIN: Prealbumin: 16 mg/dL — ABNORMAL LOW (ref 18–38)

## 2021-04-19 MED ORDER — DIPHENHYDRAMINE HCL 25 MG PO CAPS
ORAL_CAPSULE | ORAL | Status: AC
  Filled 2021-04-19: qty 2

## 2021-04-19 MED ORDER — ACETAMINOPHEN 325 MG PO TABS
ORAL_TABLET | ORAL | Status: AC
  Filled 2021-04-19: qty 2

## 2021-04-19 MED ORDER — ACETAMINOPHEN 325 MG PO TABS
650.0000 mg | ORAL_TABLET | Freq: Once | ORAL | Status: AC
  Administered 2021-04-19: 650 mg via ORAL

## 2021-04-19 MED ORDER — HEPARIN SOD (PORK) LOCK FLUSH 100 UNIT/ML IV SOLN
500.0000 [IU] | Freq: Once | INTRAVENOUS | Status: AC | PRN
  Administered 2021-04-19: 500 [IU]
  Filled 2021-04-19: qty 5

## 2021-04-19 MED ORDER — PALONOSETRON HCL INJECTION 0.25 MG/5ML
INTRAVENOUS | Status: AC
  Filled 2021-04-19: qty 5

## 2021-04-19 MED ORDER — SODIUM CHLORIDE 0.9% FLUSH
10.0000 mL | INTRAVENOUS | Status: DC | PRN
  Administered 2021-04-19: 10 mL
  Filled 2021-04-19: qty 10

## 2021-04-19 MED ORDER — DEXTROSE 5 % IV SOLN
Freq: Once | INTRAVENOUS | Status: AC
  Filled 2021-04-19: qty 250

## 2021-04-19 MED ORDER — PALONOSETRON HCL INJECTION 0.25 MG/5ML
0.2500 mg | Freq: Once | INTRAVENOUS | Status: AC
  Administered 2021-04-19: 0.25 mg via INTRAVENOUS

## 2021-04-19 MED ORDER — FAM-TRASTUZUMAB DERUXTECAN-NXKI CHEMO 100 MG IV SOLR
6.2500 mg/kg | Freq: Once | INTRAVENOUS | Status: AC
  Administered 2021-04-19: 400 mg via INTRAVENOUS
  Filled 2021-04-19: qty 20

## 2021-04-19 MED ORDER — DIPHENHYDRAMINE HCL 25 MG PO CAPS
50.0000 mg | ORAL_CAPSULE | Freq: Once | ORAL | Status: AC
  Administered 2021-04-19: 50 mg via ORAL

## 2021-04-19 MED ORDER — SODIUM CHLORIDE 0.9 % IV SOLN
10.0000 mg | Freq: Once | INTRAVENOUS | Status: AC
  Administered 2021-04-19: 10 mg via INTRAVENOUS
  Filled 2021-04-19: qty 10

## 2021-04-19 NOTE — Patient Instructions (Signed)
Tunneled Central Venous Catheter Flushing Guide It is important to flush your tunneled central venous catheter each time you use it, both before and after you use it. Flushing your catheter will help prevent it from clogging. What are the risks? Risks may include: Infection. Air getting into the catheter and bloodstream. Supplies needed: A clean pair of gloves. A disinfecting wipe. Use an alcohol wipe, chlorhexidine wipe, or iodine wipe as told by your health care provider. A 10 mL syringe that has been prefilled with saline solution. An empty 10 mL syringe, if a substance called heparin was injected into your catheter. How to flush your catheter When you flush your catheter, make sure you follow any specific instructions from your health care provider or the manufacturer. These are general guidelines. Flushing your catheter before use If there is heparin in your catheter: Wash your hands with soap and water. Put on gloves. Scrub the injection cap for a minimum of 15 seconds with a disinfecting wipe. Unclamp the catheter. Attach the empty syringe to the injection cap. Pull the syringe plunger back and withdraw 10 mL of blood. Place the syringe into an appropriate waste container. Scrub the injection cap for 15 seconds with a disinfecting wipe. Attach the prefilled syringe to the injection cap. Flush the catheter by pushing the plunger forward until all the liquid from the syringe is in the catheter. Remove the syringe from the injection cap. Clamp the catheter. If there is no heparin in your catheter: Wash your hands with soap and water. Put on gloves. Scrub the injection cap for 15 seconds with a disinfecting wipe. Unclamp the catheter. Attach the prefilled syringe to the injection cap. Flush the catheter by pushing the plunger forward until 5 mL of the liquid from the syringe is in the catheter. Pull back on the syringe until you see blood in the catheter. If you have been asked  to collect any blood, follow your health care provider's instructions. Otherwise, flush the catheter with the rest of the solution from the syringe. Remove the syringe from the injection cap. Clamp the catheter.  Flushing your catheter after use Wash your hands with soap and water. Put on gloves. Scrub the injection cap for 15 seconds with a disinfecting wipe. Unclamp the catheter. Attach the prefilled syringe to the injection cap. Flush the catheter by pushing the plunger forward until all of the liquid from the syringe is in the catheter. Remove the syringe from the injection cap. Clamp the catheter. Problems and solutions If blood cannot be completely cleared from the injection cap, you may need to have the injection cap replaced. If the catheter is difficult to flush, use the pulsing method. The pulsing method involves pushing only a few milliliters of solution into the catheter at a time and pausing between pushes. If you do not see blood in the catheter when you pull back on the syringe, change your body position, such as by raising your arms above your head. Take a deep breath and cough. Then, pull back on the syringe. If you still do not see blood, flush the catheter with a small amount of solution. Then, change positions again and take a breath or cough. Pull back on the syringe again. If you still do not see blood, finish flushing the catheter and contact your health care provider. Do not use your catheter until your health care provider says it is okay. General tips Have someone help you flush your catheter, if possible. Do not force fluid   through your catheter. Do not use a syringe that is larger or smaller than 10 mL. Using a smaller syringe can make the catheter burst. Do not use your catheter without flushing it first if it has heparin in it. Contact a health care provider if: You cannot see any blood in the catheter when you flush it before using it. Your catheter is difficult  to flush. Get help right away if: You cannot flush the catheter. The catheter leaks when you flush it or when there is fluid in it. There are cracks or breaks in the catheter. Summary It is important to flush your tunneled central venous catheter each time you use it, both before and after you use it. Scrub the injection cap for 15 seconds with a disinfecting wipe before and after you flush it. When you flush your catheter, make sure you follow any specific instructions from your health care provider or the manufacturer. Get help right away if you cannot flush the catheter. This information is not intended to replace advice given to you by your health care provider. Make sure you discuss any questions you have with your health care provider. Document Revised: 11/05/2019 Document Reviewed: 11/12/2018 Elsevier Patient Education  2022 Elsevier Inc.  

## 2021-04-19 NOTE — Progress Notes (Signed)
Hematology and Oncology Follow Up Visit  Jimmy Blankenship [id number] May 16, 1951 70 y.o. 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  The big news is that he will be going to [place] in September.  He will be leaving September 13.  Happy that he he can go.  I think you will be ready to go.  I think he is doing okay with the Enhertu.  We will have to set him up with scans to see how everything looks.  He has had 3 cycles of Enhertu to date.  After his fourth cycle today, we will go ahead and do a scan to see how erythema looks.  He is eating okay.  He does have some diarrhea with the Enhertu.  He only has 1 a day.  He is on Lomotil.  He is on Xarelto 10 mg a day for this ventricular thrombus.  At some point, we probably will have to get an echocardiogram to see how everything looks.  He has had no problems with pain.  He has had no cough or shortness of breath.  There is been no obvious bleeding.  Currently, his performance status is ECOG 1.   Medications:  Allergies as of 04/19/2021   No Known Allergies      Medication List        Accurate as of April 19, 2021  8:47 AM. If you have any questions, ask your nurse or doctor.          atorvastatin 80 MG tablet Commonly known as: LIPITOR Take 1 tablet (80 mg total) by mouth daily.   dexamethasone 4 MG tablet Commonly known as: DECADRON Take 2 tablets (8 mg total) by mouth daily.  Start the day after chemotherapy for 2 days.   diphenoxylate-atropine 2.5-0.025 MG tablet Commonly known as: LOMOTIL TAKE ONE TABLET BY MOUTH FOUR TIMES A DAY AS NEEDED FOR DIARRHEA OR LOOSE STOOLS   feeding supplement Liqd Take 237 mLs by mouth 2 (two) times daily between meals.   gabapentin 300 MG capsule Commonly known as: NEURONTIN Take 1 capsule (300 mg total) by mouth 2 (two) times daily.   glipiZIDE 5 MG tablet Commonly known as: GLUCOTROL Take 5 mg by mouth 2 (two) times daily before a meal.   lidocaine-prilocaine cream Commonly known as: EMLA Apply to affected area once   LORazepam 0.5 MG tablet Commonly known as: Ativan Take 1 tablet (0.5 mg total) by mouth every 6 (six) hours as needed (Nausea or vomiting).   megestrol 400 MG/10ML suspension Commonly known as: MEGACE Take 10 mLs (400 mg total) by mouth 2 (two) times daily.   metFORMIN 1000 MG tablet Commonly known as: GLUCOPHAGE Take 1,000 mg by mouth 2 (two) times daily with a meal.   metoprolol tartrate 50 MG tablet Commonly known as: LOPRESSOR TAKE ONE TABLET BY MOUTH TWICE A DAY   ondansetron 8 MG tablet Commonly known as: Zofran Take 1 tablet (  8 mg total) by mouth 2 (two) times daily as needed for refractory nausea / vomiting. Start on day 3 after chemo.   pantoprazole 40 MG tablet Commonly known as: PROTONIX Take 1 tablet (40 mg total) by mouth 2 (two) times daily.   prochlorperazine 10 MG tablet Commonly known as: COMPAZINE Take 1 tablet (10 mg total) by mouth every 6 (six) hours as needed (Nausea or vomiting).   rivaroxaban 10 MG Tabs tablet Commonly known as: XARELTO Take 1 tablet (10 mg total) by mouth daily with supper.   VITAMIN B-12 PO Take 1 tablet by mouth daily.        Allergies: No Known Allergies  Past Medical History, Surgical history, Social history, and Family History were reviewed and updated.  Review of Systems: Review of Systems  Constitutional: Negative.   HENT:  Negative.    Eyes: Negative.   Respiratory: Negative.    Cardiovascular: Negative.   Gastrointestinal:  Positive for diarrhea.  Genitourinary: Negative.   Musculoskeletal: Negative.   Skin: Negative.   Neurological: Negative.   Endo/Heme/Allergies: Negative.   Psychiatric/Behavioral: Negative.      Physical Exam:  height is _0  (1.753 m) and weight is 140 lb 1.9 oz (63.6 kg). His oral temperature is 98.6 F (37 C). His blood pressure is 115/69 and his pulse is 95. His respiration is 16 and oxygen saturation is 100%.   Wt Readings from Last 3 Encounters:  04/19/21 140 lb 1.9 oz (63.6 kg)  03/29/21 140 lb (63.5 kg)  03/07/21 142 lb (64.4 kg)    Physical Exam Vitals reviewed.  HENT:     Head: Normocephalic and atraumatic.  Eyes:     Pupils: Pupils are equal, round, and reactive to light.  Cardiovascular:     Rate and Rhythm: Normal rate and regular rhythm.     Heart sounds: Normal heart sounds.  Pulmonary:     Effort: Pulmonary effort is normal.     Breath sounds: Normal breath sounds.  Abdominal:     General: Bowel sounds are normal.     Palpations: Abdomen is soft.  Musculoskeletal:        General: No tenderness or deformity. Normal range of motion.     Cervical back: Normal range of motion.  Lymphadenopathy:     Cervical: No cervical adenopathy.  Skin:    General: Skin is warm and dry.     Findings: No erythema or rash.  Neurological:     Mental Status: He is alert and oriented to person, place, and time.  Psychiatric:        Behavior: Behavior normal.        Thought Content: Thought content normal.        Judgment: Judgment normal.    Lab Results  Component Value Date   WBC 8.3 04/19/2021   HGB 9.5 (L) 04/19/2021   HCT 29.2 (L) 04/19/2021   MCV 93.9 04/19/2021   PLT 139 (L) 04/19/2021   Lab Results  Component Value Date   FERRITIN 1,016 (H) 03/29/2021   IRON 55 03/29/2021   TIBC 211 (L) 03/29/2021   UIBC 156 03/29/2021   IRONPCTSAT 26 03/29/2021    Lab Results  Component Value Date   RETICCTPCT 3.4 (H) 04/19/2021   RBC 3.07 (L) 04/19/2021   No results found for: KPAFRELGTCHN, LAMBDASER, KAPLAMBRATIO No results found for: IGGSERUM, IGA, IGMSERUM No results found for: TOTALPROTELP, ALBUMINELP, A1GS, A2GS, BETS, BETA2SER, [place], [place], [place]  Component Value Date/Time   NA 136 03/29/2021 0955   NA 140 06/10/2020 0935   K 3.6 03/29/2021 0955   CL 104 03/29/2021 0955   CO2 24 03/29/2021 0955   BUN 17 03/29/2021 0955   BUN 17 06/10/2020 0935   CREATININE 0.76 03/29/2021 0955      Component Value Date/Time   CALCIUM 9.2 03/29/2021 0955   ALKPHOS 75 03/29/2021 0955   AST 18 03/29/2021 0955   ALT 50 (H) 03/29/2021 0955   BILITOT 0.4 03/29/2021 0955       Impression and Plan: Mr. Mikes is a very pleasant 70 yo [name] gentleman with metastatic adenocarcinoma of the stomach. He has completed 7 cycles of chemotherapy along with immunotherapy and targeted therapy.  He was recently diagnosed with a ventricular thrombus and is currently on full dose Xarelto.   We will go ahead with his fourth cycle of Enhertu.  After this, we will assess him with our scans.  Again I have to believe that he is responding.  Want to do everything we can to try to optimize his performance status before his trip to [place].  He will be out there for a month.  I will make his next cycle of Enhertu in early September.  We will get the CT scan right before then.  He will be on Xarelto so this does not bother me with respect to blood clotting on his trip over to [place].  I am just happy that he is feeling better.  I know this is a big event for him I want him to enjoy being over in [place] with his family.  Volanda Napoleon, MD 8/10/20228:47 AM

## 2021-04-21 ENCOUNTER — Other Ambulatory Visit: Payer: Self-pay | Admitting: Hematology & Oncology

## 2021-04-21 DIAGNOSIS — C169 Malignant neoplasm of stomach, unspecified: Secondary | ICD-10-CM

## 2021-04-24 ENCOUNTER — Encounter: Payer: Self-pay | Admitting: Family

## 2021-04-24 ENCOUNTER — Inpatient Hospital Stay: Payer: Medicare Other

## 2021-04-24 ENCOUNTER — Other Ambulatory Visit: Payer: Self-pay

## 2021-04-24 ENCOUNTER — Encounter: Payer: Self-pay | Admitting: Hematology & Oncology

## 2021-04-24 VITALS — BP 119/71 | HR 98 | Temp 98.1°F | Resp 16

## 2021-04-24 DIAGNOSIS — K922 Gastrointestinal hemorrhage, unspecified: Secondary | ICD-10-CM

## 2021-04-24 DIAGNOSIS — D500 Iron deficiency anemia secondary to blood loss (chronic): Secondary | ICD-10-CM

## 2021-04-24 DIAGNOSIS — Z5112 Encounter for antineoplastic immunotherapy: Secondary | ICD-10-CM | POA: Diagnosis not present

## 2021-04-24 MED ORDER — SODIUM CHLORIDE 0.9 % IV SOLN
200.0000 mg | Freq: Once | INTRAVENOUS | Status: AC
  Administered 2021-04-24: 200 mg via INTRAVENOUS
  Filled 2021-04-24: qty 200

## 2021-04-24 MED ORDER — SODIUM CHLORIDE 0.9 % IV SOLN: Freq: Once | INTRAVENOUS | Status: AC

## 2021-04-24 MED ORDER — HEPARIN SOD (PORK) LOCK FLUSH 100 UNIT/ML IV SOLN
500.0000 [IU] | Freq: Once | INTRAVENOUS | Status: AC | PRN
  Administered 2021-04-24: 500 [IU]

## 2021-04-24 MED ORDER — SODIUM CHLORIDE 0.9% FLUSH
10.0000 mL | Freq: Once | INTRAVENOUS | Status: AC | PRN
  Administered 2021-04-24: 10 mL

## 2021-04-24 NOTE — Patient Instructions (Signed)

## 2021-05-16 ENCOUNTER — Other Ambulatory Visit: Payer: Self-pay

## 2021-05-16 ENCOUNTER — Ambulatory Visit (HOSPITAL_BASED_OUTPATIENT_CLINIC_OR_DEPARTMENT_OTHER)
Admission: RE | Admit: 2021-05-16 | Discharge: 2021-05-16 | Disposition: A | Discharge status: Home / Self Care | Payer: Medicare Other | Source: Ambulatory Visit | Attending: Hematology & Oncology | Admitting: Hematology & Oncology

## 2021-05-16 ENCOUNTER — Encounter (HOSPITAL_BASED_OUTPATIENT_CLINIC_OR_DEPARTMENT_OTHER): Payer: Self-pay

## 2021-05-16 DIAGNOSIS — C165 Malignant neoplasm of lesser curvature of stomach, unspecified: Secondary | ICD-10-CM | POA: Diagnosis present

## 2021-05-16 IMAGING — CT CT CHEST-ABD-PELV W/ CM
2 of 5 series · 13 of 36 positions shown, 15 images · IV contrast (Omnipaque)
Comparison: CT abdomen pelvis, [DATE], CT chest abdomen pelvis,
[DATE], MR abdomen, [DATE]

CLINICAL DATA: Gastric cancer restaging, status post chemotherapy,
assess treatment response

EXAM:
CT CHEST, ABDOMEN, AND PELVIS WITH CONTRAST
TECHNIQUE: Multidetector CT imaging of the chest, abdomen and pelvis was
performed following the standard protocol during bolus
administration of intravenous contrast.
CONTRAST:  85mL OMNIPAQUE IOHEXOL 350 MG/ML SOLN, additional oral
enteric contrast

[Series 2: cap with 2 · axial · 0.85mm/px · z∈[+746,+1351]mm · 10 of 149 slices shown, 12 images]
[im 14/149  mediastinal]
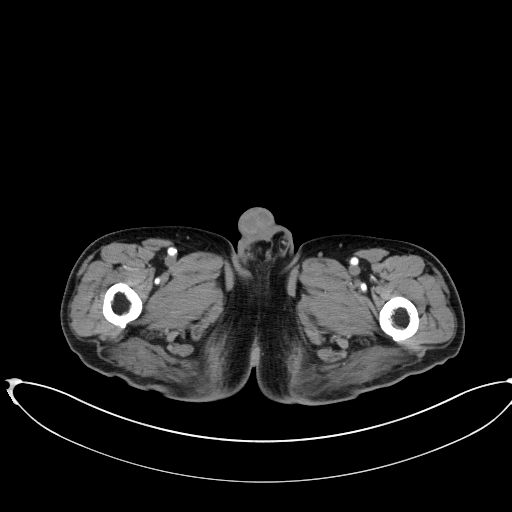
[im 14/149  bone]
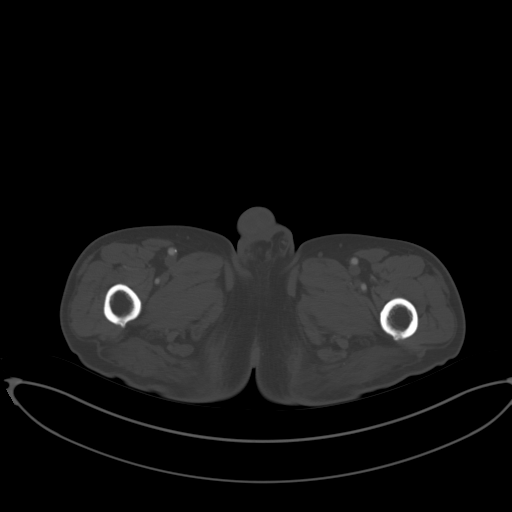
[im 27/149  mediastinal]
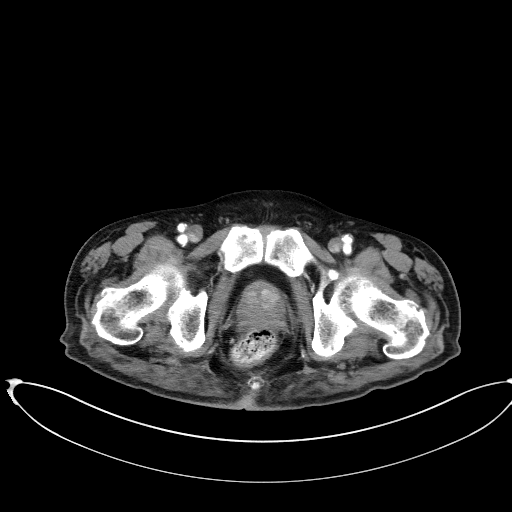
[im 41/149  mediastinal]
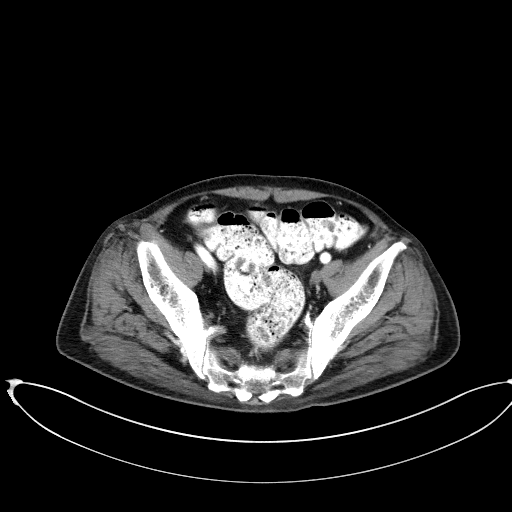
[im 54/149  mediastinal]
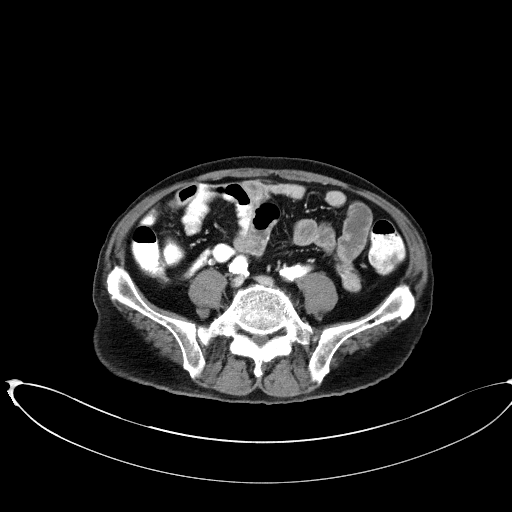
[im 68/149  mediastinal]
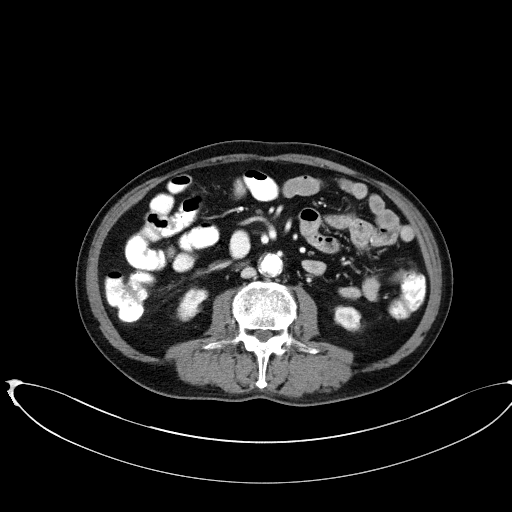
[im 81/149  mediastinal]
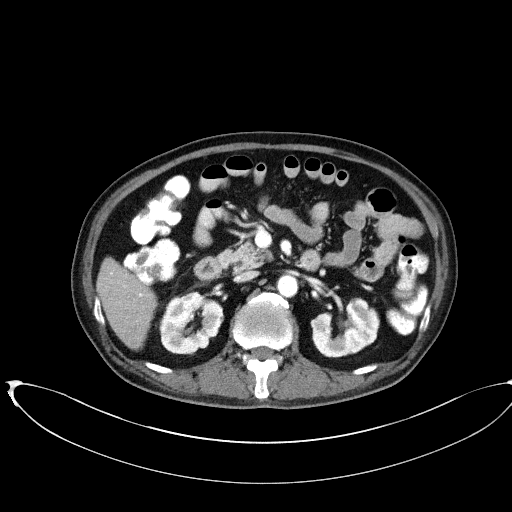
[im 95/149  mediastinal]
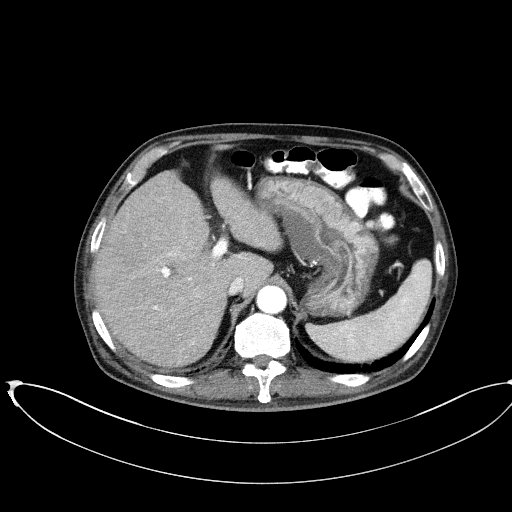
[im 108/149  mediastinal]
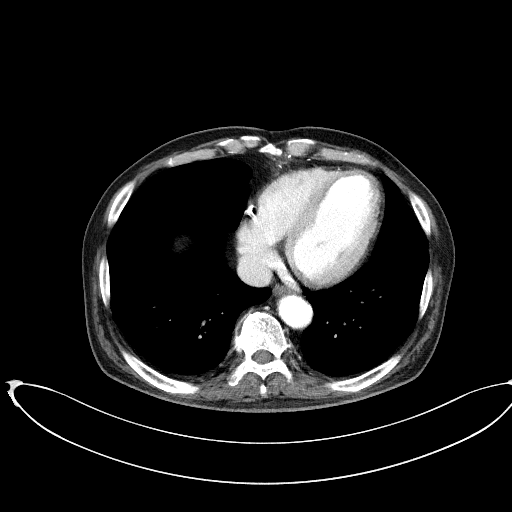
[im 122/149  mediastinal]
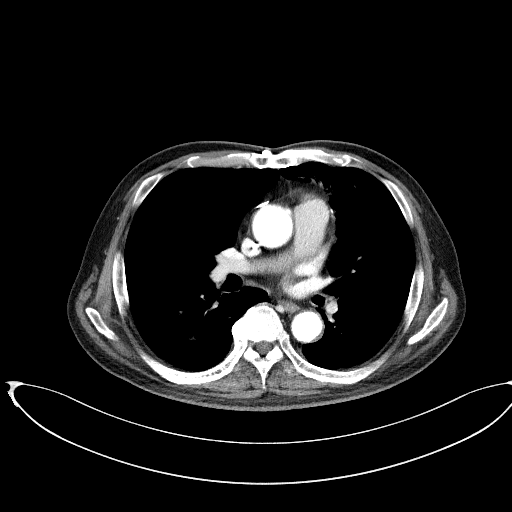
[im 122/149  bone]
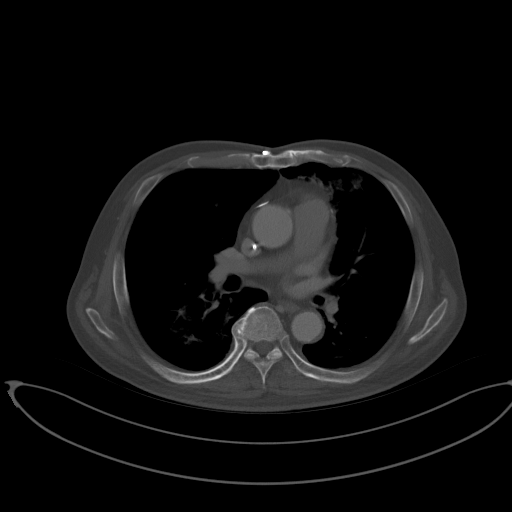
[im 135/149  mediastinal]
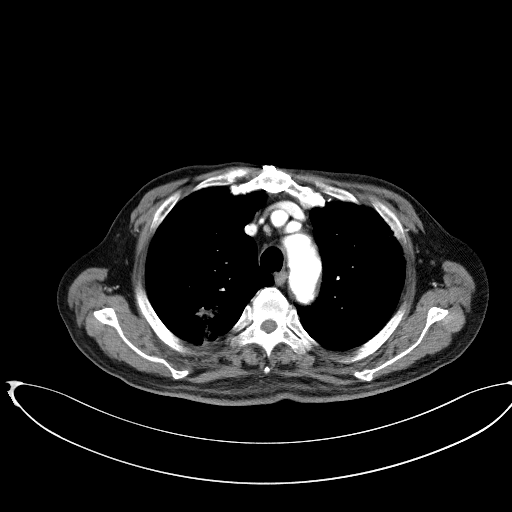

[Series 5: coronals · coronal · 0.82mm/px · 3 of 127 slices shown]
[im 26/127  mediastinal]
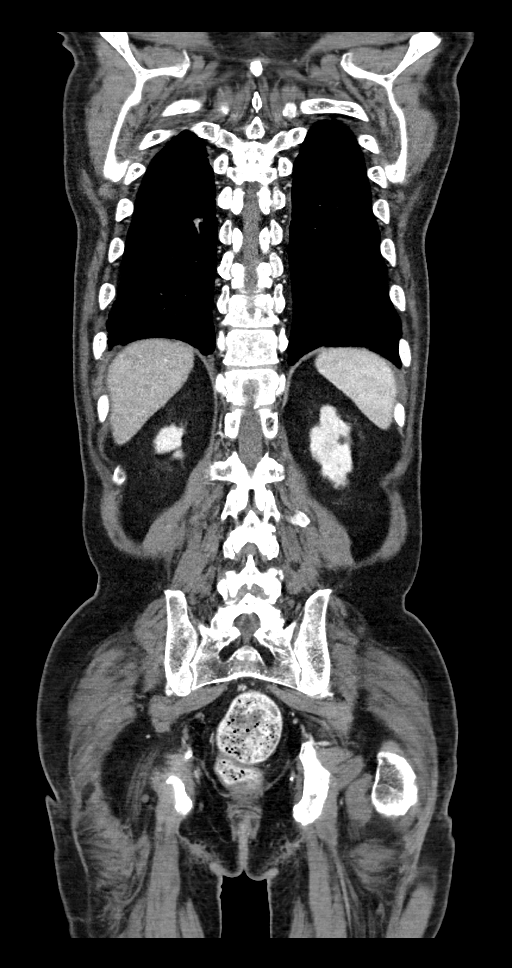
[im 51/127  mediastinal]
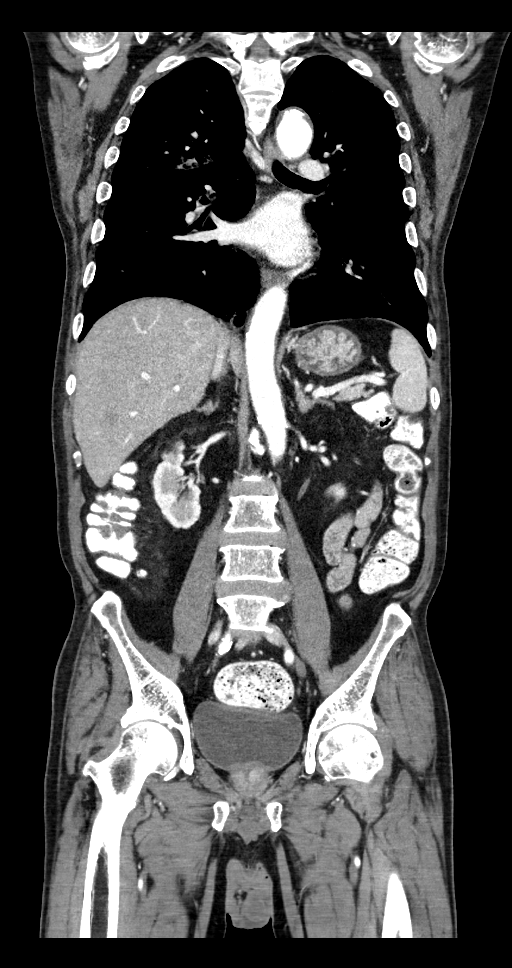
[im 76/127  mediastinal]
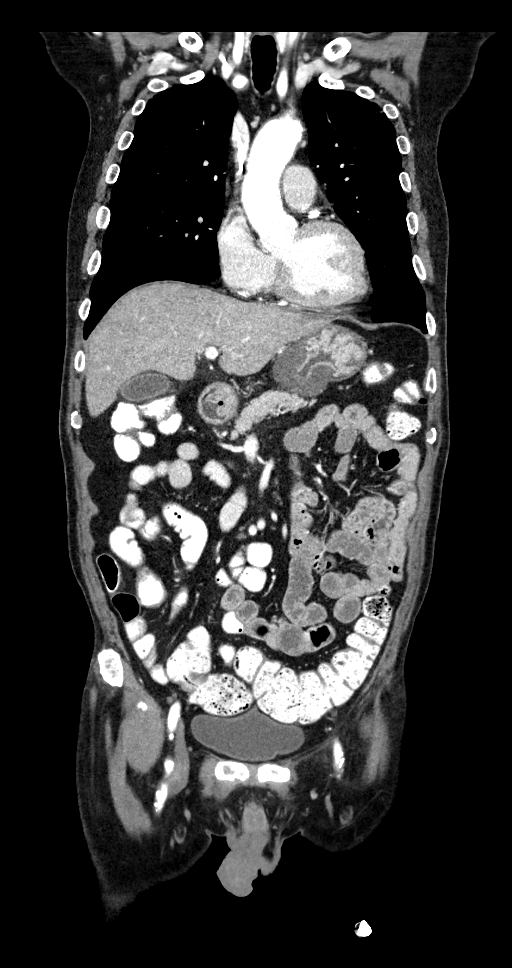

[13 of 36 positions shown; findings below may reference images not displayed]

FINDINGS: CT CHEST FINDINGS

Cardiovascular: Right chest port catheter. Aortic atherosclerosis.
Normal heart size. Three-vessel coronary artery calcifications
and/or stents, status post median sternotomy and CABG. No
pericardial effusion.

Mediastinum/Nodes: No enlarged mediastinal, hilar, or axillary lymph
nodes. Thyroid gland, trachea, and esophagus demonstrate no
significant findings.

Lungs/Pleura: Mild centrilobular and paraseptal emphysema. Diffuse
bilateral bronchial wall thickening. There is new, extensive
ground-glass and heterogeneous airspace opacity throughout the
lungs, most conspicuous in the posterior right upper lobe (series 4,
image 46). No pleural effusion or pneumothorax.

Musculoskeletal: No chest wall mass or suspicious bone lesions
identified.

CT ABDOMEN PELVIS FINDINGS

Hepatobiliary: Redemonstrated, subtly hypodense liver lesions, some
of which now demonstrate central hyperenhancement (series 2, image
58, 64). Index lesion of the inferior right lobe of the liver,
hepatic segment VI, measures 1.5 x 1.4 cm, unchanged (series 2,
image 65). There is a lesion of the left lobe of the liver, hepatic
segment II, measuring 1.3 x 1.3 cm, which was not seen by prior CT,
but noted on prior MR dated [DATE] and is unchanged (series 2,
image 48). No gallstones, gallbladder wall thickening, or biliary
dilatation.

Pancreas: Unremarkable. No pancreatic ductal dilatation or
surrounding inflammatory changes.

Spleen: Normal in size without significant abnormality.

Adrenals/Urinary Tract: Adrenal glands are unremarkable. Kidneys are
normal, without renal calculi, solid lesion, or hydronephrosis.
Bladder is unremarkable.

Stomach/Bowel: No significant change in post treatment appearance of
the stomach, with a partially circumferential mass of the gastric
body and antrum, with marked thickening of the wall and loss of
normal mucosal rugation (series 2, image 57). Maximum thickness of
the mucosa is approximately 1.9 cm, unchanged. Appendix appears
normal. No evidence of bowel wall thickening, distention, or
inflammatory changes.

Vascular/Lymphatic: Severe, mixed calcific aortic atherosclerosis.
No enlarged abdominal or pelvic lymph nodes.

Reproductive: No mass or other abnormality.

Other: No abdominal wall hernia or abnormality. No abdominopelvic
ascites.

Musculoskeletal: No acute or significant osseous findings.
IMPRESSION: 1. No significant change in post treatment appearance of the
stomach, with a partially circumferential mass of the gastric body
and antrum, with marked thickening of the wall and loss of normal
mucosal rugation. Maximum thickness of the mucosa is approximately
1.9 cm, unchanged.
2. Redemonstrated liver lesions, not significantly changed and
consistent with hepatic metastatic disease.
3. No evidence of new metastatic disease in the chest, abdomen, or
pelvis.
4. New, extensive ground-glass and heterogeneous airspace opacity
throughout the lungs, most conspicuous in the posterior right upper
lobe and most consistent with multifocal infection. Drug toxicity is
a differential consideration in the setting of chemotherapy.
5. Emphysema.
6. Coronary artery disease.

Aortic Atherosclerosis ([8J]-[8J]) and Emphysema ([8J]-[8J]).

## 2021-05-16 MED ORDER — IOHEXOL 350 MG/ML SOLN
100.0000 mL | Freq: Once | INTRAVENOUS | Status: AC | PRN
  Administered 2021-05-16: 85 mL via INTRAVENOUS

## 2021-05-17 ENCOUNTER — Inpatient Hospital Stay: Payer: Medicare Other

## 2021-05-17 ENCOUNTER — Inpatient Hospital Stay: Payer: Medicare Other | Attending: Hematology & Oncology

## 2021-05-17 ENCOUNTER — Other Ambulatory Visit (HOSPITAL_BASED_OUTPATIENT_CLINIC_OR_DEPARTMENT_OTHER): Payer: Medicare Other

## 2021-05-17 ENCOUNTER — Encounter: Payer: Self-pay | Admitting: Hematology & Oncology

## 2021-05-17 ENCOUNTER — Inpatient Hospital Stay (HOSPITAL_BASED_OUTPATIENT_CLINIC_OR_DEPARTMENT_OTHER): Payer: Medicare Other | Admitting: Hematology & Oncology

## 2021-05-17 VITALS — BP 112/71 | HR 82 | Temp 98.0°F | Resp 17 | Wt 135.0 lb

## 2021-05-17 DIAGNOSIS — K922 Gastrointestinal hemorrhage, unspecified: Secondary | ICD-10-CM | POA: Diagnosis not present

## 2021-05-17 DIAGNOSIS — Z86718 Personal history of other venous thrombosis and embolism: Secondary | ICD-10-CM | POA: Insufficient documentation

## 2021-05-17 DIAGNOSIS — D500 Iron deficiency anemia secondary to blood loss (chronic): Secondary | ICD-10-CM | POA: Diagnosis not present

## 2021-05-17 DIAGNOSIS — R197 Diarrhea, unspecified: Secondary | ICD-10-CM | POA: Diagnosis not present

## 2021-05-17 DIAGNOSIS — Z79899 Other long term (current) drug therapy: Secondary | ICD-10-CM | POA: Insufficient documentation

## 2021-05-17 DIAGNOSIS — I513 Intracardiac thrombosis, not elsewhere classified: Secondary | ICD-10-CM

## 2021-05-17 DIAGNOSIS — R918 Other nonspecific abnormal finding of lung field: Secondary | ICD-10-CM | POA: Diagnosis not present

## 2021-05-17 DIAGNOSIS — C165 Malignant neoplasm of lesser curvature of stomach, unspecified: Secondary | ICD-10-CM | POA: Diagnosis not present

## 2021-05-17 DIAGNOSIS — C787 Secondary malignant neoplasm of liver and intrahepatic bile duct: Secondary | ICD-10-CM | POA: Diagnosis not present

## 2021-05-17 DIAGNOSIS — E860 Dehydration: Secondary | ICD-10-CM

## 2021-05-17 DIAGNOSIS — I219 Acute myocardial infarction, unspecified: Secondary | ICD-10-CM | POA: Diagnosis not present

## 2021-05-17 DIAGNOSIS — R059 Cough, unspecified: Secondary | ICD-10-CM | POA: Insufficient documentation

## 2021-05-17 DIAGNOSIS — Z7901 Long term (current) use of anticoagulants: Secondary | ICD-10-CM | POA: Diagnosis not present

## 2021-05-17 LAB — CMP (CANCER CENTER ONLY)
ALT: 26 U/L (ref 0–44)
AST: 30 U/L (ref 15–41)
Albumin: 3.4 g/dL — ABNORMAL LOW (ref 3.5–5.0)
Alkaline Phosphatase: 72 U/L (ref 38–126)
Anion gap: 11 (ref 5–15)
BUN: 17 mg/dL (ref 8–23)
CO2: 20 mmol/L — ABNORMAL LOW (ref 22–32)
Calcium: 9.3 mg/dL (ref 8.9–10.3)
Chloride: 103 mmol/L (ref 98–111)
Creatinine: 0.83 mg/dL (ref 0.61–1.24)
GFR, Estimated: >60 mL/min (ref >60)
Glucose, Bld: 198 mg/dL — ABNORMAL HIGH (ref 70–99)
Potassium: 3.8 mmol/L (ref 3.5–5.1)
Sodium: 134 mmol/L — ABNORMAL LOW (ref 135–145)
Total Bilirubin: 0.7 mg/dL (ref 0.3–1.2)
Total Protein: 5.9 g/dL — ABNORMAL LOW (ref 6.5–8.1)

## 2021-05-17 LAB — CBC WITH DIFFERENTIAL (CANCER CENTER ONLY)
Abs Immature Granulocytes: 0.08 10*3/uL — ABNORMAL HIGH (ref 0.00–0.07)
Basophils Absolute: 0 10*3/uL (ref 0.0–0.1)
Basophils Relative: 0 %
Eosinophils Absolute: 0.1 10*3/uL (ref 0.0–0.5)
Eosinophils Relative: 1 %
HCT: 27.7 % — ABNORMAL LOW (ref 39.0–52.0)
Hemoglobin: 9.1 g/dL — ABNORMAL LOW (ref 13.0–17.0)
Immature Granulocytes: 2 %
Lymphocytes Relative: 11 %
Lymphs Abs: 0.6 10*3/uL — ABNORMAL LOW (ref 0.7–4.0)
MCH: 32.5 pg (ref 26.0–34.0)
MCHC: 32.9 g/dL (ref 30.0–36.0)
MCV: 98.9 fL (ref 80.0–100.0)
Monocytes Absolute: 0.5 10*3/uL (ref 0.1–1.0)
Monocytes Relative: 10 %
Neutro Abs: 3.9 10*3/uL (ref 1.7–7.7)
Neutrophils Relative %: 76 %
Platelet Count: 77 10*3/uL — ABNORMAL LOW (ref 150–400)
RBC: 2.8 MIL/uL — ABNORMAL LOW (ref 4.22–5.81)
RDW: 17.4 % — ABNORMAL HIGH (ref 11.5–15.5)
WBC Count: 5.1 10*3/uL (ref 4.0–10.5)
nRBC: 0 % (ref 0.0–0.2)

## 2021-05-17 LAB — RETICULOCYTES
Immature Retic Fract: 26.4 % — ABNORMAL HIGH (ref 2.3–15.9)
RBC.: 2.79 MIL/uL — ABNORMAL LOW (ref 4.22–5.81)
Retic Count, Absolute: 97.7 10*3/uL (ref 19.0–186.0)
Retic Ct Pct: 3.5 % — ABNORMAL HIGH (ref 0.4–3.1)

## 2021-05-17 LAB — SAMPLE TO BLOOD BANK

## 2021-05-17 LAB — PREPARE RBC (CROSSMATCH)

## 2021-05-17 LAB — LACTATE DEHYDROGENASE: LDH: 231 U/L — ABNORMAL HIGH (ref 98–192)

## 2021-05-17 MED ORDER — METHYLPREDNISOLONE SODIUM SUCC 40 MG IJ SOLR: 40.0000 mg | Freq: Once | INTRAMUSCULAR | Status: DC

## 2021-05-17 MED ORDER — METHYLPREDNISOLONE SODIUM SUCC 125 MG IJ SOLR
80.0000 mg | Freq: Once | INTRAMUSCULAR | Status: AC
  Administered 2021-05-17: 80 mg via INTRAVENOUS

## 2021-05-17 MED ORDER — FAMOTIDINE 20 MG IN NS 100 ML IVPB
20.0000 mg | Freq: Two times a day (BID) | INTRAVENOUS | Status: DC
  Filled 2021-05-17: qty 100

## 2021-05-17 MED ORDER — SODIUM CHLORIDE 0.9 % IV SOLN
40.0000 mg | Freq: Once | INTRAVENOUS | Status: AC
  Administered 2021-05-17: 40 mg via INTRAVENOUS
  Filled 2021-05-17: qty 4

## 2021-05-17 MED ORDER — SODIUM CHLORIDE 0.9 % IV SOLN
1000.0000 mL | INTRAVENOUS | Status: AC
  Administered 2021-05-17: 1000 mL via INTRAVENOUS

## 2021-05-17 MED ORDER — SODIUM CHLORIDE 0.9 % IV SOLN: INTRAVENOUS | Status: AC

## 2021-05-17 MED ORDER — FAMOTIDINE IN NACL 20-0.9 MG/50ML-% IV SOLN: 20.0000 mg | Freq: Once | INTRAVENOUS | Status: DC

## 2021-05-17 NOTE — Patient Instructions (Signed)

## 2021-05-17 NOTE — Patient Instructions (Signed)
Implanted Port Home Guide An implanted port is a device that is placed under the skin. It is usually placed in the chest. The device can be used to give IV medicine, to take blood, or for dialysis. You may have an implanted port if: You need IV medicine that would be irritating to the small veins in your hands or arms. You need IV medicines, such as antibiotics, for a long period of time. You need IV nutrition for a long period of time. You need dialysis. When you have a port, your health care provider can choose to use the port instead of veins in your arms for these procedures. You may have fewer limitations when using a port than you would if you used other types of long-term IVs, and you will likely be able to return to normal activities after your incision heals. An implanted port has two main parts: Reservoir. The reservoir is the part where a needle is inserted to give medicines or draw blood. The reservoir is round. After it is placed, it appears as a small, raised area under your skin. Catheter. The catheter is a thin, flexible tube that connects the reservoir to a vein. Medicine that is inserted into the reservoir goes into the catheter and then into the vein. How is my port accessed? To access your port: A numbing cream may be placed on the skin over the port site. Your health care provider will put on a mask and sterile gloves. The skin over your port will be cleaned carefully with a germ-killing soap and allowed to dry. Your health care provider will gently pinch the port and insert a needle into it. Your health care provider will check for a blood return to make sure the port is in the vein and is not clogged. If your port needs to remain accessed to get medicine continuously (constant infusion), your health care provider will place a clear bandage (dressing) over the needle site. The dressing and needle will need to be changed every week, or as told by your health care provider. What  is flushing? Flushing helps keep the port from getting clogged. Follow instructions from your health care provider about how and when to flush the port. Ports are usually flushed with saline solution or a medicine called heparin. The need for flushing will depend on how the port is used: If the port is only used from time to time to give medicines or draw blood, the port may need to be flushed: Before and after medicines have been given. Before and after blood has been drawn. As part of routine maintenance. Flushing may be recommended every 4-6 weeks. If a constant infusion is running, the port may not need to be flushed. Throw away any syringes in a disposal container that is meant for sharp items (sharps container). You can buy a sharps container from a pharmacy, or you can make one by using an empty hard plastic bottle with a cover. How long will my port stay implanted? The port can stay in for as long as your health care provider thinks it is needed. When it is time for the port to come out, a surgery will be done to remove it. The surgery will be similar to the procedure that was done to put the port in. Follow these instructions at home:  Flush your port as told by your health care provider. If you need an infusion over several days, follow instructions from your health care provider about how   to take care of your port site. Make sure you: Wash your hands with soap and water before you change your dressing. If soap and water are not available, use alcohol-based hand sanitizer. Change your dressing as told by your health care provider. Place any used dressings or infusion bags into a plastic bag. Throw that bag in the trash. Keep the dressing that covers the needle clean and dry. Do not get it wet. Do not use scissors or sharp objects near the tube. Keep the tube clamped, unless it is being used. Check your port site every day for signs of infection. Check for: Redness, swelling, or  pain. Fluid or blood. Pus or a bad smell. Protect the skin around the port site. Avoid wearing bra straps that rub or irritate the site. Protect the skin around your port from seat belts. Place a soft pad over your chest if needed. Bathe or shower as told by your health care provider. The site may get wet as long as you are not actively receiving an infusion. Return to your normal activities as told by your health care provider. Ask your health care provider what activities are safe for you. Carry a medical alert card or wear a medical alert bracelet at all times. This will let health care providers know that you have an implanted port in case of an emergency. Get help right away if: You have redness, swelling, or pain at the port site. You have fluid or blood coming from your port site. You have pus or a bad smell coming from the port site. You have a fever. Summary Implanted ports are usually placed in the chest for long-term IV access. Follow instructions from your health care provider about flushing the port and changing bandages (dressings). Take care of the area around your port by avoiding clothing that puts pressure on the area, and by watching for signs of infection. Protect the skin around your port from seat belts. Place a soft pad over your chest if needed. Get help right away if you have a fever or you have redness, swelling, pain, drainage, or a bad smell at the port site. This information is not intended to replace advice given to you by your health care provider. Make sure you discuss any questions you have with your health care provider. Document Revised: 11/16/2020 Document Reviewed: 01/11/2020 [employer] Patient Education  2022 [employer]  

## 2021-05-17 NOTE — Progress Notes (Signed)
Hematology and Oncology Follow Up Visit  Jimmy Blankenship [id number] November 06, 1950 70 y.o. 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  We did go ahead and do a CT scan on him.  The CT scan showed that everything was nice and stable.  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  As such, while think we have to hold on the Enhertu today.  I would like to give him 2 units of blood.  His hemoglobin is 9.1.  I think with him traveling, a transfusion would certainly be of benefit for him.  I talked to him about this.  He is in agreement to the transfusion.  Thankfully, the CT scan did not show that there is any new tumor growth.  He had everything was very stable.  Has had no bleeding.  He has had no problems with nausea or vomiting.  He is eating okay.  His blood sugars are on the high side.  He has had no problems with pain.  There is been a little bit of a cough.  There is no hemoptysis.  He has had a little bit of diarrhea.  He is on the Xarelto for the intracardiac thrombus.  The CT scan started not mention anything with the heart.  At some point, her blood going to have to do an echo to evaluate for this thrombus.  When he travels over to [place], and he will be on the Xarelto.  Overall, I would say his performance status is ECOG 1.  Medications:  Allergies as of 05/17/2021   No Known Allergies      Medication  List        Accurate as of May 17, 2021  1:53 PM. If you have any questions, ask your nurse or doctor.          atorvastatin 80 MG tablet Commonly known as: LIPITOR Take 1 tablet (80 mg total) by mouth daily.   dexamethasone 4 MG tablet Commonly known as: DECADRON Take 2 tablets (8 mg total) by mouth daily. Start the day after chemotherapy for 2 days.   diphenoxylate-atropine 2.5-0.025 MG tablet Commonly known as: LOMOTIL TAKE ONE TABLET BY MOUTH FOUR TIMES A DAY AS NEEDED FOR DIARRHEA OR LOOSE STOOLS   feeding supplement Liqd Take 237 mLs by mouth 2 (two) times daily between meals.   gabapentin 300 MG capsule Commonly known as: NEURONTIN Take 1 capsule (300 mg total) by mouth 2 (two) times daily.   glipiZIDE 5 MG tablet Commonly known as: GLUCOTROL Take 5 mg by mouth 2 (two) times daily before a meal.   lidocaine-prilocaine cream Commonly known as: EMLA Apply to affected area once   LORazepam 0.5 MG tablet Commonly known as: Ativan Take 1 tablet (0.5 mg total) by mouth every 6 (six) hours as needed (Nausea or vomiting).   megestrol 400 MG/10ML suspension Commonly known as: MEGACE Take 10 mLs (400 mg total)  by mouth 2 (two) times daily.   metFORMIN 1000 MG tablet Commonly known as: GLUCOPHAGE Take 1,000 mg by mouth 2 (two) times daily with a meal.   metoprolol tartrate 50 MG tablet Commonly known as: LOPRESSOR TAKE ONE TABLET BY MOUTH TWICE A DAY   ondansetron 8 MG tablet Commonly known as: Zofran Take 1 tablet (8 mg total) by mouth 2 (two) times daily as needed for refractory nausea / vomiting. Start on day 3 after chemo.   pantoprazole 40 MG tablet Commonly known as: PROTONIX Take 1 tablet (40 mg total) by mouth 2 (two) times daily.   prochlorperazine 10 MG tablet Commonly known as: COMPAZINE Take 1 tablet (10 mg total) by mouth every 6 (six) hours as needed (Nausea or vomiting).   rivaroxaban 10 MG Tabs tablet Commonly known as: XARELTO Take  1 tablet (10 mg total) by mouth daily with supper.   VITAMIN B-12 PO Take 1 tablet by mouth daily.        Allergies: No Known Allergies  Past Medical History, Surgical history, Social history, and Family History were reviewed and updated.  Review of Systems: Review of Systems  Constitutional: Negative.   HENT: Negative.    Eyes: Negative.   Respiratory: Negative.    Cardiovascular: Negative.   Gastrointestinal:  Positive for diarrhea.  Genitourinary: Negative.   Musculoskeletal: Negative.   Skin: Negative.   Neurological: Negative.   Endo/Heme/Allergies: Negative.   Psychiatric/Behavioral: Negative.      Physical Exam:  weight is 135 lb (61.2 kg). His oral temperature is 98 F (36.7 C). His blood pressure is 112/71 and his pulse is 82. His respiration is 17 and oxygen saturation is 100%.   Wt Readings from Last 3 Encounters:  05/17/21 135 lb (61.2 kg)  04/19/21 140 lb 1.9 oz (63.6 kg)  03/29/21 140 lb (63.5 kg)    Physical Exam Vitals reviewed.  HENT:     Head: Normocephalic and atraumatic.  Eyes:     Pupils: Pupils are equal, round, and reactive to light.  Cardiovascular:     Rate and Rhythm: Normal rate and regular rhythm.     Heart sounds: Normal heart sounds.  Pulmonary:     Effort: Pulmonary effort is normal.     Breath sounds: Normal breath sounds.  Abdominal:     General: Bowel sounds are normal.     Palpations: Abdomen is soft.  Musculoskeletal:        General: No tenderness or deformity. Normal range of motion.     Cervical back: Normal range of motion.  Lymphadenopathy:     Cervical: No cervical adenopathy.  Skin:    General: Skin is warm and dry.     Findings: No erythema or rash.  Neurological:     Mental Status: He is alert and oriented to person, place, and time.  Psychiatric:        Behavior: Behavior normal.        Thought Content: Thought content normal.        Judgment: Judgment normal.    Lab Results  Component Value Date    WBC 5.1 05/17/2021   HGB 9.1 (L) 05/17/2021   HCT 27.7 (L) 05/17/2021   MCV 98.9 05/17/2021   PLT 77 (L) 05/17/2021   Lab Results  Component Value Date   FERRITIN 1,728 (H) 04/19/2021   IRON 29 (L) 04/19/2021   TIBC 229 04/19/2021   UIBC 200 04/19/2021   IRONPCTSAT 13 (L) 04/19/2021   Lab Results  Component Value Date   RETICCTPCT 3.5 (H) 05/17/2021   RBC 2.80 (L) 05/17/2021   No results found for: KPAFRELGTCHN, LAMBDASER, KAPLAMBRATIO No results found for: IGGSERUM, IGA, IGMSERUM No results found for: Odetta Pink, SPEI   Chemistry      Component Value Date/Time   NA 138 04/19/2021 0825   NA 140 06/10/2020 0935   K 3.5 04/19/2021 0825   CL 106 04/19/2021 0825   CO2 22 04/19/2021 0825   BUN 11 04/19/2021 0825   BUN 17 06/10/2020 0935   CREATININE 0.80 04/19/2021 0825      Component Value Date/Time   CALCIUM 9.1 04/19/2021 0825   ALKPHOS 70 04/19/2021 0825   AST 17 04/19/2021 0825   ALT 19 04/19/2021 0825   BILITOT 0.6 04/19/2021 0825       Impression and Plan: Mr. Mcree is a very pleasant 70 yo [name] gentleman with metastatic adenocarcinoma of the stomach. He has completed 7 cycles of chemotherapy along with immunotherapy and targeted therapy.  He was recently diagnosed with a ventricular thrombus and is currently on full dose Xarelto.   We will go ahead and hold the Enhertu for right now.  I really want his quality life to be as good as possible for when he travels over to [place].  We will go ahead and give him some Solu-Medrol and Pepcid today.  I given a dose of Solu-Medrol tomorrow.  He will get his blood transfusion tomorrow.  Again, we will have to reevaluate him when he comes back.  I am going to have to do an echocardiogram so that we can get his heart.  I am just glad that he is going be in good shape to go over to [place].  I really hope he has a wonderful time over there.  We will plan to get him  back in October when he returns.    Volanda Napoleon, MD 9/7/20221:53 PM

## 2021-05-18 ENCOUNTER — Other Ambulatory Visit: Payer: Self-pay

## 2021-05-18 ENCOUNTER — Inpatient Hospital Stay: Payer: Medicare Other

## 2021-05-18 ENCOUNTER — Telehealth: Payer: Self-pay

## 2021-05-18 VITALS — BP 131/75 | HR 87 | Temp 98.1°F | Resp 17

## 2021-05-18 DIAGNOSIS — C165 Malignant neoplasm of lesser curvature of stomach, unspecified: Secondary | ICD-10-CM

## 2021-05-18 DIAGNOSIS — K922 Gastrointestinal hemorrhage, unspecified: Secondary | ICD-10-CM

## 2021-05-18 DIAGNOSIS — D500 Iron deficiency anemia secondary to blood loss (chronic): Secondary | ICD-10-CM

## 2021-05-18 LAB — IRON AND TIBC
Iron: 49 ug/dL (ref 42–163)
Saturation Ratios: 22 % (ref 20–55)
TIBC: 223 ug/dL (ref 202–409)
UIBC: 174 ug/dL (ref 117–376)

## 2021-05-18 LAB — PSA, TOTAL AND FREE
PSA, Free Pct: 18 %
PSA, Free: 0.18 ng/mL
Prostate Specific Ag, Serum: 1 ng/mL (ref 0.0–4.0)

## 2021-05-18 LAB — FERRITIN: Ferritin: 2140 ng/mL — ABNORMAL HIGH (ref 24–336)

## 2021-05-18 MED ORDER — ACETAMINOPHEN 325 MG PO TABS
650.0000 mg | ORAL_TABLET | Freq: Once | ORAL | Status: AC
  Administered 2021-05-18: 650 mg via ORAL
  Filled 2021-05-18: qty 2

## 2021-05-18 MED ORDER — SODIUM CHLORIDE 0.9 % IV SOLN
200.0000 mg | Freq: Once | INTRAVENOUS | Status: AC
  Administered 2021-05-18: 200 mg via INTRAVENOUS
  Filled 2021-05-18: qty 200

## 2021-05-18 MED ORDER — FUROSEMIDE 10 MG/ML IJ SOLN: 20.0000 mg | Freq: Once | INTRAMUSCULAR | Status: DC

## 2021-05-18 MED ORDER — SODIUM CHLORIDE 0.9 % IV SOLN: Freq: Once | INTRAVENOUS | Status: AC

## 2021-05-18 MED ORDER — SODIUM CHLORIDE 0.9% FLUSH
10.0000 mL | INTRAVENOUS | Status: AC | PRN
  Administered 2021-05-18: 10 mL

## 2021-05-18 MED ORDER — HEPARIN SOD (PORK) LOCK FLUSH 100 UNIT/ML IV SOLN
250.0000 [IU] | INTRAVENOUS | Status: AC | PRN
  Administered 2021-05-18: 500 [IU]

## 2021-05-18 MED ORDER — DIPHENHYDRAMINE HCL 25 MG PO CAPS
25.0000 mg | ORAL_CAPSULE | Freq: Once | ORAL | Status: AC
Start: 2021-05-18 — End: 2021-05-18
  Administered 2021-05-18: 25 mg via ORAL
  Filled 2021-05-18: qty 1

## 2021-05-18 NOTE — Patient Instructions (Signed)
Blood Transfusion, Adult, Care After This sheet gives you information about how to care for yourself after your procedure. Your doctor may also give you more specific instructions. If you have problems or questions, contact your doctor. What can I expect after the procedure? After the procedure, it is common to have: Bruising and soreness at the IV site. A headache. Follow these instructions at home: Insertion site care   Follow instructions from your doctor about how to take care of your insertion site. This is where an IV tube was put into your vein. Make sure you: Wash your hands with soap and water before and after you change your bandage (dressing). If you cannot use soap and water, use hand sanitizer. Change your bandage as told by your doctor. Check your insertion site every day for signs of infection. Check for: Redness, swelling, or pain. Bleeding from the site. Warmth. Pus or a bad smell. General instructions Take over-the-counter and prescription medicines only as told by your doctor. Rest as told by your doctor. Go back to your normal activities as told by your doctor. Keep all follow-up visits as told by your doctor. This is important. Contact a doctor if: You have itching or red, swollen areas of skin (hives). You feel worried or nervous (anxious). You feel weak after doing your normal activities. You have redness, swelling, warmth, or pain around the insertion site. You have blood coming from the insertion site, and the blood does not stop with pressure. You have pus or a bad smell coming from the insertion site. Get help right away if: You have signs of a serious reaction. This may be coming from an allergy or the body's defense system (immune system). Signs include: Trouble breathing or shortness of breath. Swelling of the face or feeling warm (flushed). Fever or chills. Head, chest, or back pain. Dark pee (urine) or blood in the pee. Widespread rash. Fast  heartbeat. Feeling dizzy or light-headed. You may receive your blood transfusion in an outpatient setting. If so, you will be told whom to contact to report any reactions. These symptoms may be an emergency. Do not wait to see if the symptoms will go away. Get medical help right away. Call your local emergency services (911 in the [place] Do not drive yourself to the hospital. Summary Bruising and soreness at the IV site are common. Check your insertion site every day for signs of infection. Rest as told by your doctor. Go back to your normal activities as told by your doctor. Get help right away if you have signs of a serious reaction. This information is not intended to replace advice given to you by your health care provider. Make sure you discuss any questions you have with your health care provider. Document Revised: 12/22/2020 Document Reviewed: 02/19/2019 Elsevier Patient Education  2022 Elsevier Inc.  

## 2021-05-18 NOTE — Telephone Encounter (Signed)
Per 05/17/21 los and sch message, we are moving the 06/07/21 appts out to '10 19 22 '$ as the pt will be out of the country x 1 month    Jimmy Blankenship

## 2021-05-19 LAB — BPAM RBC
Blood Product Expiration Date: 202210052359
Blood Product Expiration Date: 202210072359
ISSUE DATE / TIME: 202209080817
ISSUE DATE / TIME: 202209080817
Unit Type and Rh: 6200
Unit Type and Rh: 6200

## 2021-05-19 LAB — TYPE AND SCREEN
ABO/RH(D): A POS
Antibody Screen: NEGATIVE
Unit division: 0
Unit division: 0

## 2021-05-20 ENCOUNTER — Other Ambulatory Visit: Payer: Self-pay | Admitting: Physician Assistant

## 2021-06-07 ENCOUNTER — Inpatient Hospital Stay: Payer: Medicare Other

## 2021-06-07 ENCOUNTER — Inpatient Hospital Stay: Payer: Medicare Other | Admitting: Hematology & Oncology

## 2021-06-07 ENCOUNTER — Other Ambulatory Visit: Payer: Self-pay | Admitting: Physician Assistant

## 2021-06-26 ENCOUNTER — Ambulatory Visit (HOSPITAL_COMMUNITY)
Admission: RE | Admit: 2021-06-26 | Discharge: 2021-06-26 | Disposition: A | Discharge status: Home / Self Care | Payer: Medicare Other | Source: Ambulatory Visit | Attending: Hematology & Oncology | Admitting: Hematology & Oncology

## 2021-06-26 ENCOUNTER — Other Ambulatory Visit: Payer: Self-pay

## 2021-06-26 DIAGNOSIS — I080 Rheumatic disorders of both mitral and aortic valves: Secondary | ICD-10-CM | POA: Diagnosis not present

## 2021-06-26 DIAGNOSIS — C165 Malignant neoplasm of lesser curvature of stomach, unspecified: Secondary | ICD-10-CM | POA: Diagnosis not present

## 2021-06-26 DIAGNOSIS — I119 Hypertensive heart disease without heart failure: Secondary | ICD-10-CM | POA: Insufficient documentation

## 2021-06-26 DIAGNOSIS — E119 Type 2 diabetes mellitus without complications: Secondary | ICD-10-CM | POA: Insufficient documentation

## 2021-06-26 DIAGNOSIS — I2510 Atherosclerotic heart disease of native coronary artery without angina pectoris: Secondary | ICD-10-CM | POA: Diagnosis not present

## 2021-06-26 DIAGNOSIS — R008 Other abnormalities of heart beat: Secondary | ICD-10-CM | POA: Diagnosis not present

## 2021-06-26 DIAGNOSIS — Z0189 Encounter for other specified special examinations: Secondary | ICD-10-CM | POA: Diagnosis not present

## 2021-06-26 LAB — ECHOCARDIOGRAM COMPLETE
Area-P 1/2: 2.76 cm2
Calc EF: 35 %
MV M vel: 5.63 m/s
MV Peak grad: 126.8 mmHg
Radius: 0.4 cm
S' Lateral: 3.6 cm
Single Plane A2C EF: 28.3 %
Single Plane A4C EF: 33.8 %

## 2021-06-26 MED ORDER — PERFLUTREN LIPID MICROSPHERE
1.0000 mL | INTRAVENOUS | Status: AC | PRN
  Administered 2021-06-26: 3 mL via INTRAVENOUS
  Filled 2021-06-26: qty 10

## 2021-06-26 NOTE — Progress Notes (Signed)
22 g IV placed for echo tech.  Saline locked.

## 2021-06-26 NOTE — Progress Notes (Signed)
  Echocardiogram 2D Echocardiogram has been performed.  Fidel Levy 06/26/2021, 11:50 AM

## 2021-06-28 ENCOUNTER — Inpatient Hospital Stay: Payer: Medicare Other

## 2021-06-28 ENCOUNTER — Encounter: Payer: Self-pay | Admitting: Hematology & Oncology

## 2021-06-28 ENCOUNTER — Inpatient Hospital Stay (HOSPITAL_BASED_OUTPATIENT_CLINIC_OR_DEPARTMENT_OTHER): Payer: Medicare Other | Admitting: Hematology & Oncology

## 2021-06-28 ENCOUNTER — Other Ambulatory Visit: Payer: Self-pay

## 2021-06-28 ENCOUNTER — Inpatient Hospital Stay: Payer: Medicare Other | Attending: Hematology & Oncology

## 2021-06-28 VITALS — BP 131/85 | HR 75 | Temp 98.3°F | Resp 19 | Wt 143.0 lb

## 2021-06-28 DIAGNOSIS — D500 Iron deficiency anemia secondary to blood loss (chronic): Secondary | ICD-10-CM | POA: Diagnosis not present

## 2021-06-28 DIAGNOSIS — C169 Malignant neoplasm of stomach, unspecified: Secondary | ICD-10-CM

## 2021-06-28 DIAGNOSIS — C165 Malignant neoplasm of lesser curvature of stomach, unspecified: Secondary | ICD-10-CM | POA: Insufficient documentation

## 2021-06-28 DIAGNOSIS — K922 Gastrointestinal hemorrhage, unspecified: Secondary | ICD-10-CM | POA: Diagnosis not present

## 2021-06-28 DIAGNOSIS — C787 Secondary malignant neoplasm of liver and intrahepatic bile duct: Secondary | ICD-10-CM | POA: Insufficient documentation

## 2021-06-28 DIAGNOSIS — Z7901 Long term (current) use of anticoagulants: Secondary | ICD-10-CM | POA: Insufficient documentation

## 2021-06-28 DIAGNOSIS — R197 Diarrhea, unspecified: Secondary | ICD-10-CM | POA: Diagnosis not present

## 2021-06-28 DIAGNOSIS — Z5112 Encounter for antineoplastic immunotherapy: Secondary | ICD-10-CM | POA: Diagnosis not present

## 2021-06-28 DIAGNOSIS — Z79899 Other long term (current) drug therapy: Secondary | ICD-10-CM | POA: Diagnosis not present

## 2021-06-28 DIAGNOSIS — Z95828 Presence of other vascular implants and grafts: Secondary | ICD-10-CM

## 2021-06-28 LAB — SAMPLE TO BLOOD BANK

## 2021-06-28 LAB — CBC WITH DIFFERENTIAL (CANCER CENTER ONLY)
Abs Immature Granulocytes: 0.08 10*3/uL — ABNORMAL HIGH (ref 0.00–0.07)
Basophils Absolute: 0.1 10*3/uL (ref 0.0–0.1)
Basophils Relative: 1 %
Eosinophils Absolute: 0.5 10*3/uL (ref 0.0–0.5)
Eosinophils Relative: 6 %
HCT: 39.7 % (ref 39.0–52.0)
Hemoglobin: 12.9 g/dL — ABNORMAL LOW (ref 13.0–17.0)
Immature Granulocytes: 1 %
Lymphocytes Relative: 26 %
Lymphs Abs: 2 10*3/uL (ref 0.7–4.0)
MCH: 31.2 pg (ref 26.0–34.0)
MCHC: 32.5 g/dL (ref 30.0–36.0)
MCV: 95.9 fL (ref 80.0–100.0)
Monocytes Absolute: 0.7 10*3/uL (ref 0.1–1.0)
Monocytes Relative: 9 %
Neutro Abs: 4.4 10*3/uL (ref 1.7–7.7)
Neutrophils Relative %: 57 %
Platelet Count: 162 10*3/uL (ref 150–400)
RBC: 4.14 MIL/uL — ABNORMAL LOW (ref 4.22–5.81)
RDW: 13.8 % (ref 11.5–15.5)
WBC Count: 7.7 10*3/uL (ref 4.0–10.5)
nRBC: 0 % (ref 0.0–0.2)

## 2021-06-28 LAB — CMP (CANCER CENTER ONLY)
ALT: 21 U/L (ref 0–44)
AST: 22 U/L (ref 15–41)
Albumin: 4 g/dL (ref 3.5–5.0)
Alkaline Phosphatase: 87 U/L (ref 38–126)
Anion gap: 9 (ref 5–15)
BUN: 12 mg/dL (ref 8–23)
CO2: 27 mmol/L (ref 22–32)
Calcium: 9.8 mg/dL (ref 8.9–10.3)
Chloride: 104 mmol/L (ref 98–111)
Creatinine: 0.81 mg/dL (ref 0.61–1.24)
GFR, Estimated: >60 mL/min (ref >60)
Glucose, Bld: 94 mg/dL (ref 70–99)
Potassium: 3.7 mmol/L (ref 3.5–5.1)
Sodium: 140 mmol/L (ref 135–145)
Total Bilirubin: 0.6 mg/dL (ref 0.3–1.2)
Total Protein: 6.4 g/dL — ABNORMAL LOW (ref 6.5–8.1)

## 2021-06-28 LAB — RETICULOCYTES
Immature Retic Fract: 21.8 % — ABNORMAL HIGH (ref 2.3–15.9)
RBC.: 4.14 MIL/uL — ABNORMAL LOW (ref 4.22–5.81)
Retic Count, Absolute: 123.4 10*3/uL (ref 19.0–186.0)
Retic Ct Pct: 3 % (ref 0.4–3.1)

## 2021-06-28 LAB — IRON AND TIBC
Iron: 92 ug/dL (ref 42–163)
Saturation Ratios: 36 % (ref 20–55)
TIBC: 255 ug/dL (ref 202–409)
UIBC: 163 ug/dL (ref 117–376)

## 2021-06-28 LAB — FERRITIN: Ferritin: 1208 ng/mL — ABNORMAL HIGH (ref 24–336)

## 2021-06-28 LAB — LACTATE DEHYDROGENASE: LDH: 177 U/L (ref 98–192)

## 2021-06-28 MED ORDER — DIPHENHYDRAMINE HCL 25 MG PO CAPS
50.0000 mg | ORAL_CAPSULE | Freq: Once | ORAL | Status: AC
  Administered 2021-06-28: 50 mg via ORAL
  Filled 2021-06-28: qty 2

## 2021-06-28 MED ORDER — ACETAMINOPHEN 325 MG PO TABS
650.0000 mg | ORAL_TABLET | Freq: Once | ORAL | Status: AC
  Administered 2021-06-28: 650 mg via ORAL
  Filled 2021-06-28: qty 2

## 2021-06-28 MED ORDER — HEPARIN SOD (PORK) LOCK FLUSH 100 UNIT/ML IV SOLN
500.0000 [IU] | Freq: Once | INTRAVENOUS | Status: AC
  Administered 2021-06-28: 500 [IU] via INTRAVENOUS

## 2021-06-28 MED ORDER — PALONOSETRON HCL INJECTION 0.25 MG/5ML
0.2500 mg | Freq: Once | INTRAVENOUS | Status: AC
  Administered 2021-06-28: 0.25 mg via INTRAVENOUS
  Filled 2021-06-28: qty 5

## 2021-06-28 MED ORDER — SODIUM CHLORIDE 0.9% FLUSH
10.0000 mL | INTRAVENOUS | Status: DC | PRN
  Administered 2021-06-28: 10 mL

## 2021-06-28 MED ORDER — HEPARIN SOD (PORK) LOCK FLUSH 100 UNIT/ML IV SOLN
500.0000 [IU] | Freq: Once | INTRAVENOUS | Status: DC | PRN
Start: 2021-06-28 — End: 2021-06-28

## 2021-06-28 MED ORDER — FAM-TRASTUZUMAB DERUXTECAN-NXKI CHEMO 100 MG IV SOLR
6.2500 mg/kg | Freq: Once | INTRAVENOUS | Status: AC
  Administered 2021-06-28: 400 mg via INTRAVENOUS
  Filled 2021-06-28: qty 20

## 2021-06-28 MED ORDER — DEXTROSE 5 % IV SOLN: Freq: Once | INTRAVENOUS | Status: AC

## 2021-06-28 MED ORDER — SODIUM CHLORIDE 0.9% FLUSH: 10.0000 mL | INTRAVENOUS | Status: AC | PRN

## 2021-06-28 MED ORDER — SODIUM CHLORIDE 0.9 % IV SOLN
10.0000 mg | Freq: Once | INTRAVENOUS | Status: AC
  Administered 2021-06-28: 10 mg via INTRAVENOUS
  Filled 2021-06-28: qty 10

## 2021-06-28 NOTE — Progress Notes (Signed)
Hematology and Oncology Follow Up Visit  Jimmy Blankenship [id number] November 20, 1950 70 y.o. 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  He had a wonderful time in [place].  He was there for almost a month.  He got back a week ago.  He had no problems while over there.  He had no problems with cough or shortness of breath.  He ate well.  His appetite is doing better.  His weight is gone up.  He has had no bleeding.  There is no diarrhea.  We did do an echocardiogram on him a couple days ago.  This does showed the ventricular thrombus.  He is on Xarelto for this.  He does have some dry skin.  I told him to apply some aloe vera lotion.  He has had no issues with fever.  He has had no mouth sores.  Overall, his performance status is ECOG 1.    Medications:  Allergies as of 06/28/2021   No Known Allergies      Medication List        Accurate as of June 28, 2021 11:02 AM. If you have any questions, ask your nurse or doctor.          atorvastatin 80 MG tablet Commonly known as: LIPITOR Take 1 tablet (80 mg total) by mouth daily.   atorvastatin 40 MG tablet Commonly known as: LIPITOR Take 2 tablets by mouth daily.   dexamethasone 4 MG tablet Commonly known as: DECADRON Take 2 tablets (8 mg total) by mouth daily. Start the day after chemotherapy for 2 days.   diphenoxylate-atropine 2.5-0.025 MG tablet Commonly known as: LOMOTIL TAKE ONE TABLET  BY MOUTH FOUR TIMES A DAY AS NEEDED FOR DIARRHEA OR LOOSE STOOLS   feeding supplement Liqd Take 237 mLs by mouth 2 (two) times daily between meals.   gabapentin 300 MG capsule Commonly known as: NEURONTIN Take 1 capsule (300 mg total) by mouth 2 (two) times daily.   glipiZIDE 5 MG tablet Commonly known as: GLUCOTROL Take 5 mg by mouth 2 (two) times daily before a meal.   lidocaine-prilocaine cream Commonly known as: EMLA Apply to affected area once   LORazepam 0.5 MG tablet Commonly known as: Ativan Take 1 tablet (0.5 mg total) by mouth every 6 (six) hours as needed (Nausea or vomiting).   megestrol 400 MG/10ML suspension Commonly known as: MEGACE Take 10 mLs (400 mg total) by mouth 2 (two) times daily.   metFORMIN 1000 MG tablet Commonly known as: GLUCOPHAGE Take 1 tablet by mouth 2 (two) times daily with a meal. What changed: Another medication with the same name was removed. Continue taking this medication, and follow the directions you see here. Changed by: Volanda Napoleon, MD   metoprolol tartrate 50 MG tablet Commonly known as: LOPRESSOR TAKE ONE TABLET BY MOUTH TWICE A DAY   ondansetron 8 MG tablet Commonly known as: Zofran Take 1 tablet (8 mg total) by mouth  2 (two) times daily as needed for refractory nausea / vomiting. Start on day 3 after chemo.   pantoprazole 40 MG tablet Commonly known as: PROTONIX Take 1 tablet (40 mg total) by mouth 2 (two) times daily.   prochlorperazine 10 MG tablet Commonly known as: COMPAZINE Take 1 tablet (10 mg total) by mouth every 6 (six) hours as needed (Nausea or vomiting).   rivaroxaban 10 MG Tabs tablet Commonly known as: XARELTO Take 1 tablet (10 mg total) by mouth daily with supper.   VITAMIN B-12 PO Take 1 tablet by mouth daily.        Allergies: No Known Allergies  Past Medical History, Surgical history, Social history, and Family History were reviewed and updated.  Review of Systems: Review of Systems   Constitutional: Negative.   HENT: Negative.    Eyes: Negative.   Respiratory: Negative.    Cardiovascular: Negative.   Gastrointestinal:  Positive for diarrhea.  Genitourinary: Negative.   Musculoskeletal: Negative.   Skin: Negative.   Neurological: Negative.   Endo/Heme/Allergies: Negative.   Psychiatric/Behavioral: Negative.      Physical Exam:  weight is 143 lb (64.9 kg). His oral temperature is 98.3 F (36.8 C). His blood pressure is 131/85 and his pulse is 75. His respiration is 19 and oxygen saturation is 99%.   Wt Readings from Last 3 Encounters:  06/28/21 143 lb (64.9 kg)  05/17/21 135 lb (61.2 kg)  04/19/21 140 lb 1.9 oz (63.6 kg)    Physical Exam Vitals reviewed.  HENT:     Head: Normocephalic and atraumatic.  Eyes:     Pupils: Pupils are equal, round, and reactive to light.  Cardiovascular:     Rate and Rhythm: Normal rate and regular rhythm.     Heart sounds: Normal heart sounds.  Pulmonary:     Effort: Pulmonary effort is normal.     Breath sounds: Normal breath sounds.  Abdominal:     General: Bowel sounds are normal.     Palpations: Abdomen is soft.  Musculoskeletal:        General: No tenderness or deformity. Normal range of motion.     Cervical back: Normal range of motion.  Lymphadenopathy:     Cervical: No cervical adenopathy.  Skin:    General: Skin is warm and dry.     Findings: No erythema or rash.  Neurological:     Mental Status: He is alert and oriented to person, place, and time.  Psychiatric:        Behavior: Behavior normal.        Thought Content: Thought content normal.        Judgment: Judgment normal.    Lab Results  Component Value Date   WBC 7.7 06/28/2021   HGB 12.9 (L) 06/28/2021   HCT 39.7 06/28/2021   MCV 95.9 06/28/2021   PLT 162 06/28/2021   Lab Results  Component Value Date   FERRITIN 2,140 (H) 05/17/2021   IRON 49 05/17/2021   TIBC 223 05/17/2021   UIBC 174 05/17/2021   IRONPCTSAT 22 05/17/2021   Lab  Results  Component Value Date   RETICCTPCT 3.0 06/28/2021   RBC 4.14 (L) 06/28/2021   RBC 4.14 (L) 06/28/2021   No results found for: KPAFRELGTCHN, LAMBDASER, KAPLAMBRATIO No results found for: IGGSERUM, IGA, IGMSERUM No results found for: Ronnald Ramp, A1GS, A2GS, Violet Baldy, MSPIKE, SPEI   Chemistry      Component Value Date/Time   NA 140 06/28/2021 0918  NA 140 06/10/2020 0935   K 3.7 06/28/2021 0918   CL 104 06/28/2021 0918   CO2 27 06/28/2021 0918   BUN 12 06/28/2021 0918   BUN 17 06/10/2020 0935   CREATININE 0.81 06/28/2021 0918      Component Value Date/Time   CALCIUM 9.8 06/28/2021 0918   ALKPHOS 87 06/28/2021 0918   AST 22 06/28/2021 0918   ALT 21 06/28/2021 0918   BILITOT 0.6 06/28/2021 0918       Impression and Plan: Mr. Thrall is a very pleasant 70 yo [name] gentleman with metastatic adenocarcinoma of the stomach. He has completed 7 cycles of chemotherapy along with immunotherapy and targeted therapy.  He was recently diagnosed with a ventricular thrombus and is currently on full dose Xarelto.   I am just glad that he had a great time in [place].  He may want to in the springtime.  Hopefully, he will be able to.  We will get him back onto the Enhertu.  Am happy that his blood count is doing so well right now.  Overall, I think his quality of life is doing pretty well.  We will plan to get him back in another 3 weeks.     Volanda Napoleon, MD 10/19/202211:02 AM

## 2021-06-28 NOTE — Addendum Note (Signed)
Addended by: Randolm Idol on: 06/28/2021 09:45 AM   Modules accepted: Orders, SmartSet

## 2021-06-28 NOTE — Progress Notes (Signed)
Pt. is accompanied by daughter who interprets from him.

## 2021-06-28 NOTE — Patient Instructions (Signed)
[facility]  Discharge Instructions: Thank you for choosing [facility] to provide your oncology and hematology care.   If you have a lab appointment with the Cancer Center, please go directly to the Cancer Center and check in at the registration area.  Wear comfortable clothing and clothing appropriate for easy access to any Portacath or PICC line.   We strive to give you quality time with your provider. You may need to reschedule your appointment if you arrive late (15 or more minutes).  Arriving late affects you and other patients whose appointments are after yours.  Also, if you miss three or more appointments without notifying the office, you may be dismissed from the clinic at the provider's discretion.      For prescription refill requests, have your pharmacy contact our office and allow 72 hours for refills to be completed.    Today you received the following chemotherapy and/or immunotherapy agents Enhertu      To help prevent nausea and vomiting after your treatment, we encourage you to take your nausea medication as directed.  BELOW ARE SYMPTOMS THAT SHOULD BE REPORTED IMMEDIATELY: *FEVER GREATER THAN 100.4 F (38 C) OR HIGHER *CHILLS OR SWEATING *NAUSEA AND VOMITING THAT IS NOT CONTROLLED WITH YOUR NAUSEA MEDICATION *UNUSUAL SHORTNESS OF BREATH *UNUSUAL BRUISING OR BLEEDING *URINARY PROBLEMS (pain or burning when urinating, or frequent urination) *BOWEL PROBLEMS (unusual diarrhea, constipation, pain near the anus) TENDERNESS IN MOUTH AND THROAT WITH OR WITHOUT PRESENCE OF ULCERS (sore throat, sores in mouth, or a toothache) UNUSUAL RASH, SWELLING OR PAIN  UNUSUAL VAGINAL DISCHARGE OR ITCHING   Items with * indicate a potential emergency and should be followed up as soon as possible or go to the Emergency Department if any problems should occur.  Please show the CHEMOTHERAPY ALERT CARD or IMMUNOTHERAPY ALERT CARD at check-in to the  Emergency Department and triage nurse. Should you have questions after your visit or need to cancel or reschedule your appointment, please contact [facility]  [phone] and follow the prompts.  Office hours are 8:00 a.m. to 4:30 p.m. Monday - Friday. Please note that voicemails left after 4:00 p.m. may not be returned until the following business day.  We are closed weekends and major holidays. You have access to a nurse at all times for urgent questions. Please call the main number to the clinic [phone] and follow the prompts.  For any non-urgent questions, you may also contact your provider using MyChart. We now offer e-Visits for anyone 18 and older to request care online for non-urgent symptoms. For details visit [email or web address].   Also download the MyChart app! Go to the app store, search "MyChart", open the app, select [facility], and log in with your MyChart username and password.  Due to Covid, a mask is required upon entering the hospital/clinic. If you do not have a mask, one will be given to you upon arrival. For doctor visits, patients may have 1 support person aged 18 or older with them. For treatment visits, patients cannot have anyone with them due to current Covid guidelines and our immunocompromised population.  

## 2021-06-28 NOTE — Patient Instructions (Signed)

## 2021-06-28 NOTE — Progress Notes (Signed)
Per Dr. Marin Olp, okay to treat today despite EF

## 2021-07-19 ENCOUNTER — Inpatient Hospital Stay: Payer: Medicare Other | Attending: Hematology & Oncology

## 2021-07-19 ENCOUNTER — Inpatient Hospital Stay: Payer: Medicare Other

## 2021-07-19 ENCOUNTER — Other Ambulatory Visit: Payer: Self-pay

## 2021-07-19 ENCOUNTER — Encounter: Payer: Self-pay | Admitting: Hematology & Oncology

## 2021-07-19 ENCOUNTER — Inpatient Hospital Stay (HOSPITAL_BASED_OUTPATIENT_CLINIC_OR_DEPARTMENT_OTHER): Payer: Medicare Other | Admitting: Hematology & Oncology

## 2021-07-19 VITALS — BP 130/71 | HR 70 | Temp 98.1°F | Resp 20 | Wt 145.1 lb

## 2021-07-19 DIAGNOSIS — C165 Malignant neoplasm of lesser curvature of stomach, unspecified: Secondary | ICD-10-CM

## 2021-07-19 DIAGNOSIS — D500 Iron deficiency anemia secondary to blood loss (chronic): Secondary | ICD-10-CM | POA: Insufficient documentation

## 2021-07-19 DIAGNOSIS — Z79899 Other long term (current) drug therapy: Secondary | ICD-10-CM | POA: Insufficient documentation

## 2021-07-19 DIAGNOSIS — C7951 Secondary malignant neoplasm of bone: Secondary | ICD-10-CM | POA: Insufficient documentation

## 2021-07-19 DIAGNOSIS — R197 Diarrhea, unspecified: Secondary | ICD-10-CM | POA: Diagnosis not present

## 2021-07-19 DIAGNOSIS — C169 Malignant neoplasm of stomach, unspecified: Secondary | ICD-10-CM | POA: Insufficient documentation

## 2021-07-19 DIAGNOSIS — Z5112 Encounter for antineoplastic immunotherapy: Secondary | ICD-10-CM | POA: Diagnosis present

## 2021-07-19 DIAGNOSIS — K922 Gastrointestinal hemorrhage, unspecified: Secondary | ICD-10-CM | POA: Insufficient documentation

## 2021-07-19 DIAGNOSIS — R635 Abnormal weight gain: Secondary | ICD-10-CM | POA: Diagnosis not present

## 2021-07-19 DIAGNOSIS — Z7901 Long term (current) use of anticoagulants: Secondary | ICD-10-CM | POA: Insufficient documentation

## 2021-07-19 DIAGNOSIS — C787 Secondary malignant neoplasm of liver and intrahepatic bile duct: Secondary | ICD-10-CM | POA: Diagnosis not present

## 2021-07-19 LAB — CBC WITH DIFFERENTIAL (CANCER CENTER ONLY)
Abs Immature Granulocytes: 0.04 10*3/uL (ref 0.00–0.07)
Basophils Absolute: 0 10*3/uL (ref 0.0–0.1)
Basophils Relative: 1 %
Eosinophils Absolute: 0.1 10*3/uL (ref 0.0–0.5)
Eosinophils Relative: 3 %
HCT: 33 % — ABNORMAL LOW (ref 39.0–52.0)
Hemoglobin: 11.1 g/dL — ABNORMAL LOW (ref 13.0–17.0)
Immature Granulocytes: 1 %
Lymphocytes Relative: 38 %
Lymphs Abs: 1.7 10*3/uL (ref 0.7–4.0)
MCH: 31.4 pg (ref 26.0–34.0)
MCHC: 33.6 g/dL (ref 30.0–36.0)
MCV: 93.2 fL (ref 80.0–100.0)
Monocytes Absolute: 0.6 10*3/uL (ref 0.1–1.0)
Monocytes Relative: 14 %
Neutro Abs: 1.9 10*3/uL (ref 1.7–7.7)
Neutrophils Relative %: 43 %
Platelet Count: 136 10*3/uL — ABNORMAL LOW (ref 150–400)
RBC: 3.54 MIL/uL — ABNORMAL LOW (ref 4.22–5.81)
RDW: 14.1 % (ref 11.5–15.5)
WBC Count: 4.5 10*3/uL (ref 4.0–10.5)
nRBC: 0 % (ref 0.0–0.2)

## 2021-07-19 LAB — CMP (CANCER CENTER ONLY)
ALT: 29 U/L (ref 0–44)
AST: 24 U/L (ref 15–41)
Albumin: 3.6 g/dL (ref 3.5–5.0)
Alkaline Phosphatase: 72 U/L (ref 38–126)
Anion gap: 9 (ref 5–15)
BUN: 13 mg/dL (ref 8–23)
CO2: 21 mmol/L — ABNORMAL LOW (ref 22–32)
Calcium: 9.4 mg/dL (ref 8.9–10.3)
Chloride: 109 mmol/L (ref 98–111)
Creatinine: 0.92 mg/dL (ref 0.61–1.24)
GFR, Estimated: >60 mL/min (ref >60)
Glucose, Bld: 234 mg/dL — ABNORMAL HIGH (ref 70–99)
Potassium: 3.7 mmol/L (ref 3.5–5.1)
Sodium: 139 mmol/L (ref 135–145)
Total Bilirubin: 0.4 mg/dL (ref 0.3–1.2)
Total Protein: 5.7 g/dL — ABNORMAL LOW (ref 6.5–8.1)

## 2021-07-19 LAB — LACTATE DEHYDROGENASE: LDH: 194 U/L — ABNORMAL HIGH (ref 98–192)

## 2021-07-19 MED ORDER — SODIUM CHLORIDE 0.9% FLUSH
10.0000 mL | INTRAVENOUS | Status: DC | PRN
  Administered 2021-07-19: 10 mL

## 2021-07-19 MED ORDER — ACETAMINOPHEN 325 MG PO TABS
650.0000 mg | ORAL_TABLET | Freq: Once | ORAL | Status: AC
  Administered 2021-07-19: 650 mg via ORAL
  Filled 2021-07-19: qty 2

## 2021-07-19 MED ORDER — FAM-TRASTUZUMAB DERUXTECAN-NXKI CHEMO 100 MG IV SOLR
6.2600 mg/kg | Freq: Once | INTRAVENOUS | Status: AC
  Administered 2021-07-19: 400 mg via INTRAVENOUS
  Filled 2021-07-19: qty 20

## 2021-07-19 MED ORDER — DEXTROSE 5 % IV SOLN: INTRAVENOUS | Status: DC

## 2021-07-19 MED ORDER — DIPHENHYDRAMINE HCL 25 MG PO CAPS
50.0000 mg | ORAL_CAPSULE | Freq: Once | ORAL | Status: AC
  Administered 2021-07-19: 50 mg via ORAL
  Filled 2021-07-19: qty 2

## 2021-07-19 MED ORDER — SODIUM CHLORIDE 0.9 % IV SOLN: Freq: Once | INTRAVENOUS | Status: AC

## 2021-07-19 MED ORDER — PALONOSETRON HCL INJECTION 0.25 MG/5ML
0.2500 mg | Freq: Once | INTRAVENOUS | Status: AC
  Administered 2021-07-19: 0.25 mg via INTRAVENOUS
  Filled 2021-07-19: qty 5

## 2021-07-19 MED ORDER — SODIUM CHLORIDE 0.9 % IV SOLN
10.0000 mg | Freq: Once | INTRAVENOUS | Status: AC
  Administered 2021-07-19: 10 mg via INTRAVENOUS
  Filled 2021-07-19: qty 10

## 2021-07-19 MED ORDER — DEXTROSE 5 % IV SOLN: Freq: Once | INTRAVENOUS | Status: AC

## 2021-07-19 MED ORDER — HEPARIN SOD (PORK) LOCK FLUSH 100 UNIT/ML IV SOLN
500.0000 [IU] | Freq: Once | INTRAVENOUS | Status: AC | PRN
  Administered 2021-07-19: 500 [IU]

## 2021-07-19 NOTE — Patient Instructions (Signed)

## 2021-07-19 NOTE — Patient Instructions (Signed)
Fam-Trastuzumab deruxtecan injection °What is this medication? °TRASTUZUMAB DERUXTECAN (tras TOOZ eu mab DER ux TEE kan) is a chemotherapy medicine and a monoclonal antibody. It treats certain types of cancer. Some of the cancers treated are breast cancer and gastric cancer. °This medicine may be used for other purposes; ask your health care provider or pharmacist if you have questions. °COMMON BRAND NAME(S): ENHERTU °What should I tell my care team before I take this medication? °They need to know if you have any of these conditions: °heart disease °heart failure °infection (especially a virus infection such as chickenpox, cold sores, or herpes) °liver disease °lung or breathing disease, like asthma °an unusual or allergic reaction to fam-trastuzumab deruxtecan, other medications, foods, dyes, or preservatives °pregnant or trying to get pregnant °breast-feeding °How should I use this medication? °This medicine is for infusion into a vein. It is given by a health care professional in a hospital or clinic setting. °Talk to your pediatrician regarding the use of this medicine in children. Special care may be needed. °Overdosage: If you think you have taken too much of this medicine contact a poison control center or emergency room at once. °NOTE: This medicine is only for you. Do not share this medicine with others. °What if I miss a dose? °It is important not to miss your dose. Call your doctor or health care professional if you are unable to keep an appointment. °What may interact with this medication? °Interaction studies have not been performed. °This list may not describe all possible interactions. Give your health care provider a list of all the medicines, herbs, non-prescription drugs, or dietary supplements you use. Also tell them if you smoke, drink alcohol, or use illegal drugs. Some items may interact with your medicine. °What should I watch for while using this medication? °Visit your healthcare  professional for regular checks on your progress. Tell your healthcare professional if your symptoms do not start to get better or if they get worse. °Your condition will be monitored carefully while you are receiving this medicine. °Do not become pregnant while taking this medicine or for 7 months after stopping it. Women should inform their healthcare professional if they wish to become pregnant or think they might be pregnant. Men should not father a child while taking this medicine and for 4 months after stopping it. There is potential for serious side effects to an unborn child. Talk to your healthcare professional for more information. °Do not breast-feed an infant while taking this medicine or for 7 months after the last dose. °This medicine has caused decreased sperm counts in some men. This may make it more difficult to father a child. Talk to your healthcare professional if you are concerned about your fertility. °This medicine may increase your risk to bruise or bleed. Call your health care professional if you notice any unusual bleeding. °Be careful brushing or flossing your teeth or using a toothpick because you may get an infection or bleed more easily. If you have any dental work done, tell your dentist you are receiving this medicine. °This medicine may cause dry eyes [and blurred vision]. If you wear contact lenses, you may feel some discomfort. Lubricating eye drops may help. See your healthcare professional if the problem does not go away or is severe. °Call your healthcare professional for advice if you get a fever, chills, or sore throat, or other symptoms of a cold or flu. Do not treat yourself. This medicine decreases your body's ability to fight infections.   Try to avoid being around people who are sick. °Avoid taking medicines that contain aspirin, acetaminophen, ibuprofen, naproxen, or ketoprofen unless instructed by your healthcare professional. These medicines may hide a fever. °What side  effects may I notice from receiving this medication? °Side effects that you should report to your doctor or health care professional as soon as possible: °allergic reactions like skin rash, itching or hives, swelling of the face, lips, or tongue °breathing problems °cough °nausea, vomiting °signs and symptoms of bleeding such as bloody or black, tarry stools; red or dark-brown urine; spitting up blood or brown material that looks like coffee grounds; red spots on the skin; unusual bruising or bleeding from the eye, gums, or nose °signs and symptoms of heart failure like breathing problems, fast, irregular heartbeat, sudden weight gain; swelling of the ankles, feet, hands; unusually weak or tired °signs and symptoms of infection like fever; chills; cough; sore throat; pain or trouble passing urine °signs and symptoms of low red blood cells or anemia such as unusually weak or tired; feeling faint or lightheaded; falls; breathing problems °Side effects that usually do not require medical attention (report these to your doctor or health care professional if they continue or are bothersome): °constipation °diarrhea °dry eyes °hair loss °loss of appetite °mouth sores °rash °This list may not describe all possible side effects. Call your doctor for medical advice about side effects. You may report side effects to FDA at 1-800-FDA-1088. °Where should I keep my medication? °This drug is given in a hospital or clinic and will not be stored at home. °NOTE: This sheet is a summary. It may not cover all possible information. If you have questions about this medicine, talk to your doctor, pharmacist, or health care provider. °© 2022 [employer] (2021-05-16 00:00:00) ° °

## 2021-07-19 NOTE — Progress Notes (Signed)
Hematology and Oncology Follow Up Visit  Jimmy Blankenship [id number] July 31, 1951 70 y.o. 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  He looks good.  He is gaining weight.  He is having no problems with bowels or bladder.  He is having no issues with the Xarelto that he is on for the ventricular thrombus.  He has had no problems with pain.  He has had no fever.  He had a little bit of diarrhea after the last cycle.  This was self limited.  He has had no bleeding.  There is no rashes.  He has had no leg swelling.  Overall, I would have to say his performance status is probably ECOG 1.    Medications:  Allergies as of 07/19/2021   No Known Allergies      Medication List        Accurate as of July 19, 2021 12:23 PM. If you have any questions, ask your nurse or doctor.          STOP taking these medications    VITAMIN B-12 PO Stopped by: Volanda Napoleon, MD       TAKE these medications    atorvastatin 80 MG tablet Commonly known as: LIPITOR Take 1 tablet (80 mg total) by mouth daily. What changed: Another medication with the same name was removed. Continue taking this medication, and follow the directions you see here. Changed by: Volanda Napoleon, MD   dexamethasone 4 MG tablet Commonly known as: DECADRON Take 2 tablets (8 mg total) by mouth daily. Start the day after chemotherapy for 2 days.   diphenoxylate-atropine 2.5-0.025 MG  tablet Commonly known as: LOMOTIL TAKE ONE TABLET BY MOUTH FOUR TIMES A DAY AS NEEDED FOR DIARRHEA OR LOOSE STOOLS   feeding supplement Liqd Take 237 mLs by mouth 2 (two) times daily between meals.   gabapentin 300 MG capsule Commonly known as: NEURONTIN Take 1 capsule (300 mg total) by mouth 2 (two) times daily.   glipiZIDE 5 MG tablet Commonly known as: GLUCOTROL Take 5 mg by mouth 2 (two) times daily before a meal.   lidocaine-prilocaine cream Commonly known as: EMLA Apply to affected area once   LORazepam 0.5 MG tablet Commonly known as: Ativan Take 1 tablet (0.5 mg total) by mouth every 6 (six) hours as needed (Nausea or vomiting).   megestrol 400 MG/10ML suspension Commonly known as: MEGACE Take 10 mLs (400 mg total) by mouth 2 (two) times daily.   metFORMIN 1000 MG tablet Commonly known as: GLUCOPHAGE Take 1 tablet by mouth 2 (two) times daily with a meal.   metoprolol tartrate 50 MG tablet Commonly known as: LOPRESSOR TAKE ONE TABLET BY MOUTH TWICE A DAY   ondansetron 8 MG tablet Commonly known as: Zofran Take 1 tablet (8 mg total) by mouth 2 (two) times daily as needed for refractory nausea / vomiting. Start on day 3 after chemo.   pantoprazole 40 MG tablet Commonly  known as: PROTONIX Take 1 tablet (40 mg total) by mouth 2 (two) times daily.   prochlorperazine 10 MG tablet Commonly known as: COMPAZINE Take 1 tablet (10 mg total) by mouth every 6 (six) hours as needed (Nausea or vomiting).   rivaroxaban 10 MG Tabs tablet Commonly known as: XARELTO Take 1 tablet (10 mg total) by mouth daily with supper.        Allergies: No Known Allergies  Past Medical History, Surgical history, Social history, and Family History were reviewed and updated.  Review of Systems: Review of Systems  Constitutional: Negative.   HENT: Negative.    Eyes: Negative.   Respiratory: Negative.    Cardiovascular: Negative.   Gastrointestinal:  Positive for diarrhea.   Genitourinary: Negative.   Musculoskeletal: Negative.   Skin: Negative.   Neurological: Negative.   Endo/Heme/Allergies: Negative.   Psychiatric/Behavioral: Negative.      Physical Exam:  weight is 145 lb 1.6 oz (65.8 kg). His oral temperature is 98.1 F (36.7 C). His blood pressure is 130/71 and his pulse is 70. His respiration is 20 and oxygen saturation is 100%.   Wt Readings from Last 3 Encounters:  07/19/21 145 lb 1.6 oz (65.8 kg)  06/28/21 143 lb (64.9 kg)  05/17/21 135 lb (61.2 kg)    Physical Exam Vitals reviewed.  HENT:     Head: Normocephalic and atraumatic.  Eyes:     Pupils: Pupils are equal, round, and reactive to light.  Cardiovascular:     Rate and Rhythm: Normal rate and regular rhythm.     Heart sounds: Normal heart sounds.  Pulmonary:     Effort: Pulmonary effort is normal.     Breath sounds: Normal breath sounds.  Abdominal:     General: Bowel sounds are normal.     Palpations: Abdomen is soft.  Musculoskeletal:        General: No tenderness or deformity. Normal range of motion.     Cervical back: Normal range of motion.  Lymphadenopathy:     Cervical: No cervical adenopathy.  Skin:    General: Skin is warm and dry.     Findings: No erythema or rash.  Neurological:     Mental Status: He is alert and oriented to person, place, and time.  Psychiatric:        Behavior: Behavior normal.        Thought Content: Thought content normal.        Judgment: Judgment normal.    Lab Results  Component Value Date   WBC 4.5 07/19/2021   HGB 11.1 (L) 07/19/2021   HCT 33.0 (L) 07/19/2021   MCV 93.2 07/19/2021   PLT 136 (L) 07/19/2021   Lab Results  Component Value Date   FERRITIN 1,208 (H) 06/28/2021   IRON 92 06/28/2021   TIBC 255 06/28/2021   UIBC 163 06/28/2021   IRONPCTSAT 36 06/28/2021   Lab Results  Component Value Date   RETICCTPCT 3.0 06/28/2021   RBC 3.54 (L) 07/19/2021   No results found for: KPAFRELGTCHN, LAMBDASER, KAPLAMBRATIO No  results found for: IGGSERUM, IGA, IGMSERUM No results found for: Ronnald Ramp, A1GS, A2GS, Tillman Sers, SPEI   Chemistry      Component Value Date/Time   NA 139 07/19/2021 1130   NA 140 06/10/2020 0935   K 3.7 07/19/2021 1130   CL 109 07/19/2021 1130   CO2 21 (L) 07/19/2021 1130   BUN 13 07/19/2021 1130   BUN 17 06/10/2020 0935  CREATININE 0.92 07/19/2021 1130      Component Value Date/Time   CALCIUM 9.4 07/19/2021 1130   ALKPHOS 72 07/19/2021 1130   AST 24 07/19/2021 1130   ALT 29 07/19/2021 1130   BILITOT 0.4 07/19/2021 1130       Impression and Plan: Jimmy Blankenship is a very pleasant 70 yo [name] gentleman with metastatic adenocarcinoma of the stomach. He has completed 7 cycles of chemotherapy along with immunotherapy and targeted therapy.  Hopefully, the Enhertu is helping.  He will get his treatment today.  After this, I will then set him up with scans to see how everything looks.  He will continue on the Xarelto for the ventricular thrombus.  I think he will be on Xarelto long-term.  We will plan to get him back in 3 more weeks.  Hopefully, he will be able to have a nice Thanksgiving with his family.     Volanda Napoleon, MD 11/9/202212:23 PM

## 2021-07-20 LAB — IRON AND TIBC
Iron: 50 ug/dL (ref 42–163)
Saturation Ratios: 21 % (ref 20–55)
TIBC: 234 ug/dL (ref 202–409)
UIBC: 184 ug/dL (ref 117–376)

## 2021-07-20 LAB — FERRITIN: Ferritin: 1451 ng/mL — ABNORMAL HIGH (ref 24–336)

## 2021-08-01 ENCOUNTER — Ambulatory Visit: Payer: Medicare Other | Attending: Internal Medicine

## 2021-08-01 DIAGNOSIS — Z23 Encounter for immunization: Secondary | ICD-10-CM

## 2021-08-01 NOTE — Progress Notes (Signed)
   Covid-19 Vaccination Clinic  Name:  Jimmy Blankenship    MRN: [id number] DOB: March 27, 1951  08/01/2021  [name] was observed post Covid-19 immunization for 15 minutes without incident. He was provided with Vaccine Information Sheet and instruction to access the V-Safe system.   [name] was instructed to call 911 with any severe reactions post vaccine: Difficulty breathing  Swelling of face and throat  A fast heartbeat  A bad rash all over body  Dizziness and weakness   Immunizations Administered     Name Date Dose VIS Date Route   Pfizer Covid-19 Vaccine Bivalent Booster 08/01/2021 10:33 AM 0.3 mL 05/10/2021 Intramuscular   Manufacturer: [employer]   Lot: [id number]   [facility]: [phone]

## 2021-08-07 ENCOUNTER — Other Ambulatory Visit: Payer: Self-pay | Admitting: Family

## 2021-08-07 DIAGNOSIS — I513 Intracardiac thrombosis, not elsewhere classified: Secondary | ICD-10-CM

## 2021-08-09 ENCOUNTER — Inpatient Hospital Stay (HOSPITAL_BASED_OUTPATIENT_CLINIC_OR_DEPARTMENT_OTHER): Payer: Medicare Other | Admitting: Hematology & Oncology

## 2021-08-09 ENCOUNTER — Inpatient Hospital Stay: Payer: Medicare Other

## 2021-08-09 ENCOUNTER — Other Ambulatory Visit: Payer: Self-pay

## 2021-08-09 ENCOUNTER — Ambulatory Visit (HOSPITAL_BASED_OUTPATIENT_CLINIC_OR_DEPARTMENT_OTHER)
Admission: RE | Admit: 2021-08-09 | Discharge: 2021-08-09 | Disposition: A | Discharge status: Home / Self Care | Payer: Medicare Other | Source: Ambulatory Visit | Attending: Hematology & Oncology | Admitting: Hematology & Oncology

## 2021-08-09 ENCOUNTER — Encounter (HOSPITAL_BASED_OUTPATIENT_CLINIC_OR_DEPARTMENT_OTHER): Payer: Self-pay

## 2021-08-09 ENCOUNTER — Encounter: Payer: Self-pay | Admitting: Hematology & Oncology

## 2021-08-09 VITALS — BP 119/74 | HR 96 | Temp 98.1°F | Resp 18 | Wt 147.0 lb

## 2021-08-09 DIAGNOSIS — I513 Intracardiac thrombosis, not elsewhere classified: Secondary | ICD-10-CM | POA: Diagnosis not present

## 2021-08-09 DIAGNOSIS — I2510 Atherosclerotic heart disease of native coronary artery without angina pectoris: Secondary | ICD-10-CM | POA: Diagnosis not present

## 2021-08-09 DIAGNOSIS — C169 Malignant neoplasm of stomach, unspecified: Secondary | ICD-10-CM

## 2021-08-09 DIAGNOSIS — C165 Malignant neoplasm of lesser curvature of stomach, unspecified: Secondary | ICD-10-CM

## 2021-08-09 DIAGNOSIS — Z5112 Encounter for antineoplastic immunotherapy: Secondary | ICD-10-CM | POA: Diagnosis not present

## 2021-08-09 LAB — CMP (CANCER CENTER ONLY)
ALT: 20 U/L (ref 0–44)
AST: 18 U/L (ref 15–41)
Albumin: 3.8 g/dL (ref 3.5–5.0)
Alkaline Phosphatase: 88 U/L (ref 38–126)
Anion gap: 12 (ref 5–15)
BUN: 11 mg/dL (ref 8–23)
CO2: 22 mmol/L (ref 22–32)
Calcium: 9.7 mg/dL (ref 8.9–10.3)
Chloride: 104 mmol/L (ref 98–111)
Creatinine: 0.88 mg/dL (ref 0.61–1.24)
GFR, Estimated: >60 mL/min (ref >60)
Glucose, Bld: 105 mg/dL — ABNORMAL HIGH (ref 70–99)
Potassium: 3.5 mmol/L (ref 3.5–5.1)
Sodium: 138 mmol/L (ref 135–145)
Total Bilirubin: 0.5 mg/dL (ref 0.3–1.2)
Total Protein: 6.5 g/dL (ref 6.5–8.1)

## 2021-08-09 LAB — CBC WITH DIFFERENTIAL (CANCER CENTER ONLY)
Abs Immature Granulocytes: 0.07 10*3/uL (ref 0.00–0.07)
Basophils Absolute: 0 10*3/uL (ref 0.0–0.1)
Basophils Relative: 1 %
Eosinophils Absolute: 0.3 10*3/uL (ref 0.0–0.5)
Eosinophils Relative: 4 %
HCT: 33.5 % — ABNORMAL LOW (ref 39.0–52.0)
Hemoglobin: 11.1 g/dL — ABNORMAL LOW (ref 13.0–17.0)
Immature Granulocytes: 1 %
Lymphocytes Relative: 27 %
Lymphs Abs: 1.9 10*3/uL (ref 0.7–4.0)
MCH: 31.1 pg (ref 26.0–34.0)
MCHC: 33.1 g/dL (ref 30.0–36.0)
MCV: 93.8 fL (ref 80.0–100.0)
Monocytes Absolute: 0.8 10*3/uL (ref 0.1–1.0)
Monocytes Relative: 12 %
Neutro Abs: 3.8 10*3/uL (ref 1.7–7.7)
Neutrophils Relative %: 55 %
Platelet Count: 170 10*3/uL (ref 150–400)
RBC: 3.57 MIL/uL — ABNORMAL LOW (ref 4.22–5.81)
RDW: 14.6 % (ref 11.5–15.5)
WBC Count: 6.9 10*3/uL (ref 4.0–10.5)
nRBC: 0 % (ref 0.0–0.2)

## 2021-08-09 LAB — LACTATE DEHYDROGENASE: LDH: 162 U/L (ref 98–192)

## 2021-08-09 IMAGING — CT CT CHEST-ABD-PELV W/ CM
2 of 5 series · 13 of 36 positions shown, 15 images · IV contrast (Omnipaque)
Comparison: [DATE]

CLINICAL DATA: Metastatic gastric cancer.

EXAM:
CT CHEST, ABDOMEN, AND PELVIS WITH CONTRAST
TECHNIQUE: Multidetector CT imaging of the chest, abdomen and pelvis was
performed following the standard protocol during bolus
administration of intravenous contrast.
CONTRAST:  100mL OMNIPAQUE IOHEXOL 300 MG/ML  SOLN

[Series 2: cap with 2 · axial · 0.82mm/px · z∈[-674,-24]mm · 10 of 154 slices shown, 12 images]
[im 12/154  mediastinal]
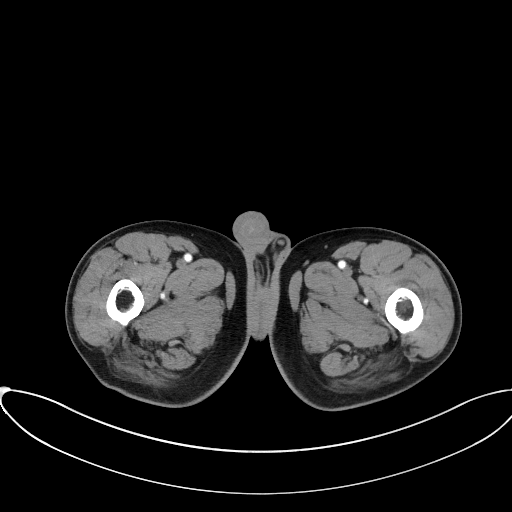
[im 12/154  bone]
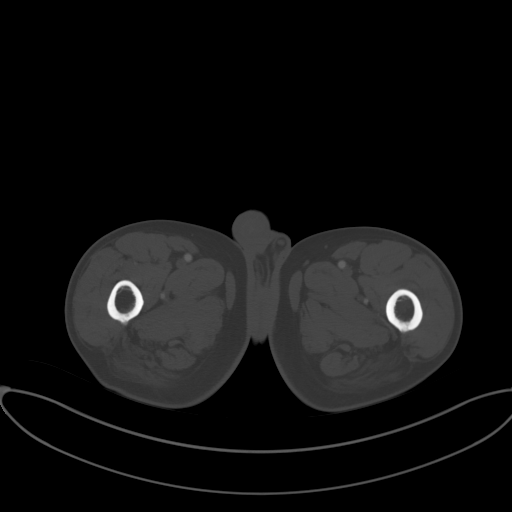
[im 24/154  mediastinal]
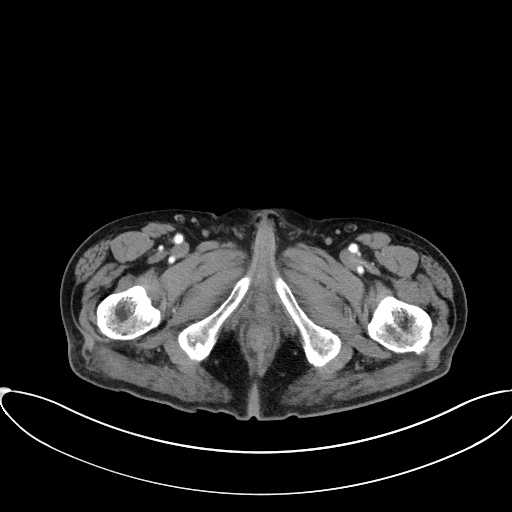
[im 48/154  mediastinal]
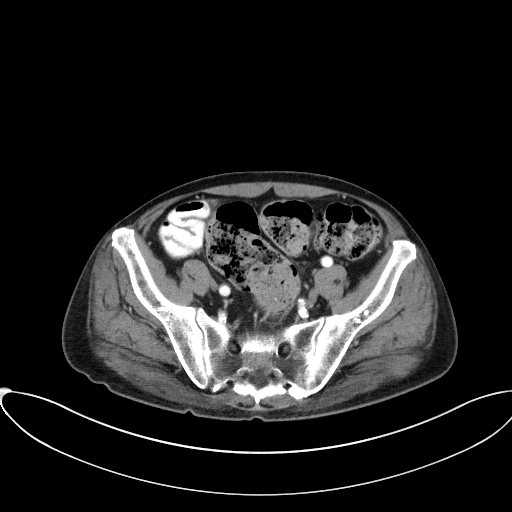
[im 59/154  mediastinal]
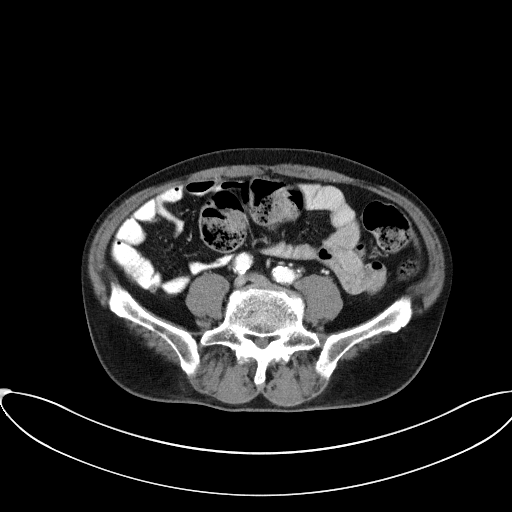
[im 71/154  mediastinal]
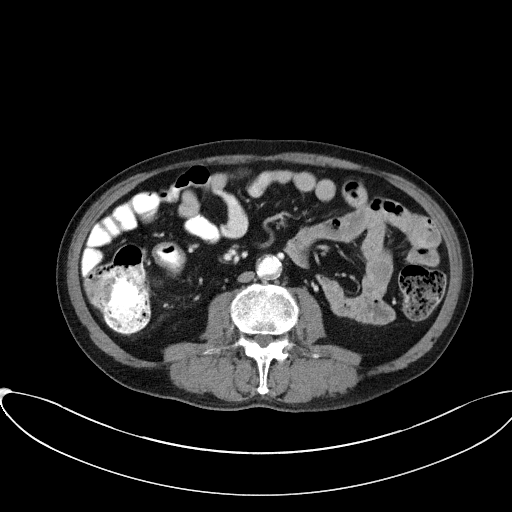
[im 83/154  mediastinal]
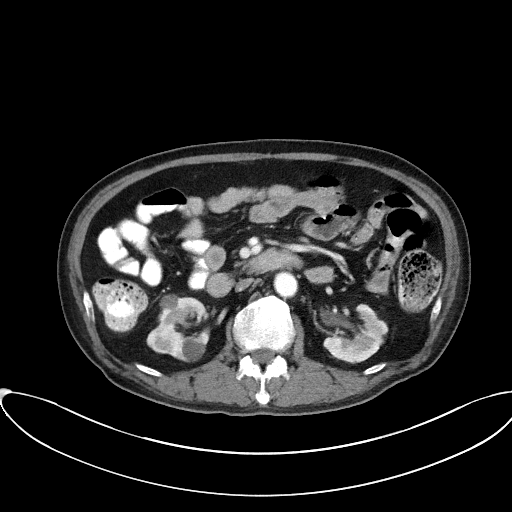
[im 95/154  mediastinal]
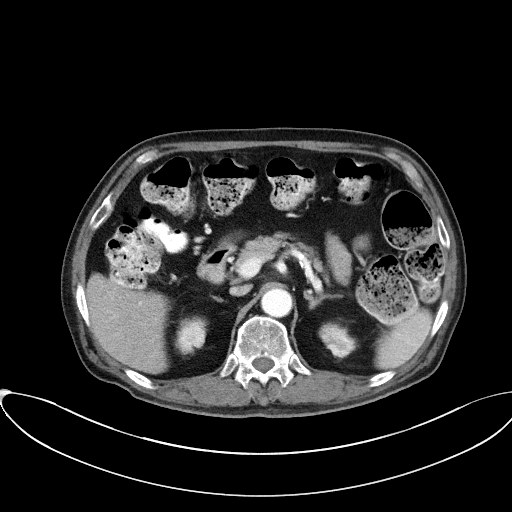
[im 118/154  mediastinal]
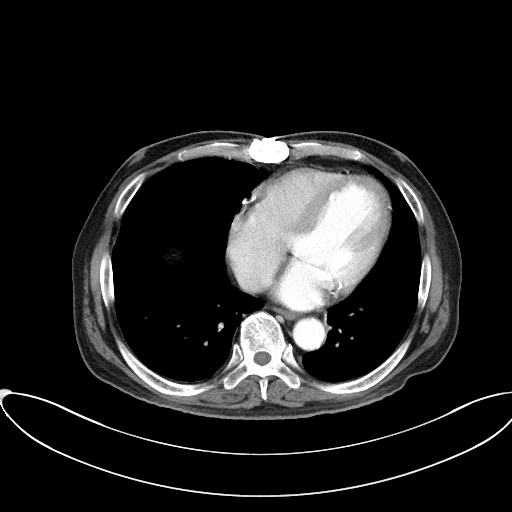
[im 130/154  mediastinal]
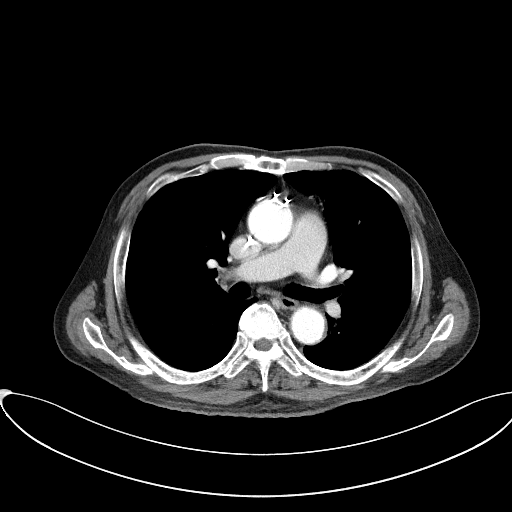
[im 130/154  bone]
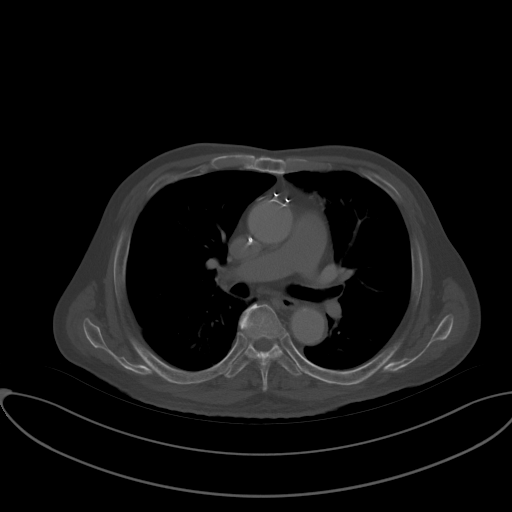
[im 142/154  mediastinal]
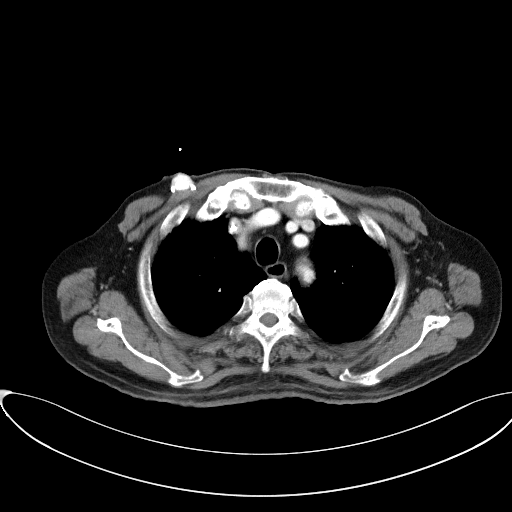

[Series 5: coronals · coronal · 0.80mm/px · 3 of 127 slices shown]
[im 26/127  mediastinal]
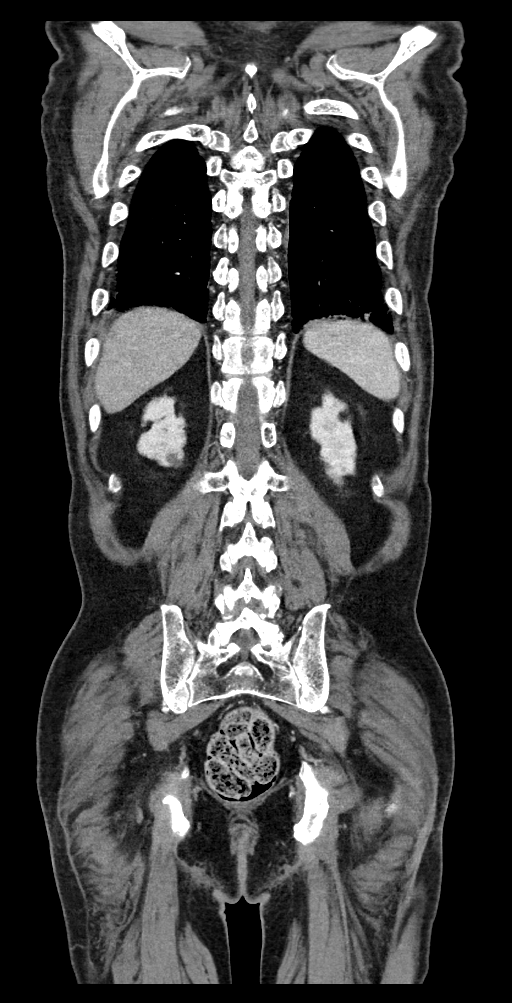
[im 51/127  mediastinal]
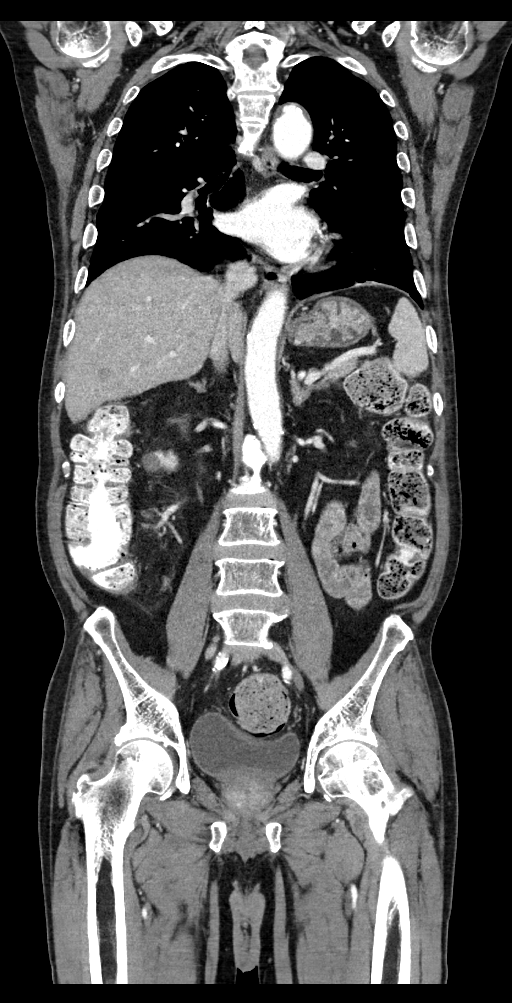
[im 76/127  mediastinal]
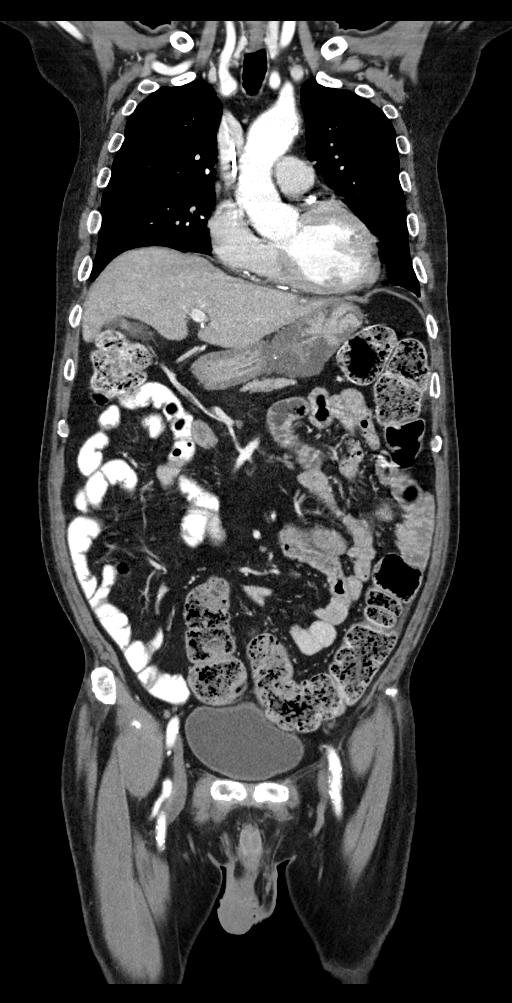

[13 of 36 positions shown; findings below may reference images not displayed]

FINDINGS: CT CHEST FINDINGS

Cardiovascular: Previous median sternotomy and CABG procedure.
Normal heart size. No pericardial effusion. Aortic atherosclerosis.

Mediastinum/Nodes: 1.2 cm right lobe of thyroid gland nodule is
unchanged. Not clinically significant; no follow-up imaging
recommended (ref: [HOSPITAL]. [DATE]): 143-50). The
trachea and esophagus demonstrate no significant findings. No
enlarged axillary, supraclavicular, mediastinal, or hilar lymph
nodes.

Lungs/Pleura: Paraseptal and centrilobular emphysema. No pleural
effusion. Interval improvement in previously noted multifocal areas
of ground-glass and airspace densities compatible with resolving
inflammation or infection. This includes previous 1 cm area of
nodular architectural distortion within the posteromedial right
lower lobe which now measures 0.6 cm, image 80/4.

Subpleural nodule within the superior segment of right lower lobe
abutting the fissure measures 4 mm and is unchanged from previous
exam. New nodular density within the periphery of the left lung base
is nonspecific measuring 5 mm, image 116/4.

Musculoskeletal: No acute or suspicious osseous findings.

CT ABDOMEN PELVIS FINDINGS

Hepatobiliary: Multifocal liver metastases are again noted. Index
lesion within lateral segment of left hepatic lobe measures 1.2 x
1.1 cm, image 46/2. Formally 1.3 x 1.3 cm. Index lesion within
inferior right hepatic lobe measures 1.3 x 1.1 cm, image 58/2.
Formally 1.5 x 1.4 cm. Index lesion within the posterior right
hepatic lobe measures 1.2 by 1.1 cm, image 53/2. Previously 1.2 x
1.3 cm. No new liver metastases. Gallbladder is unremarkable. No
bile duct dilatation.

Pancreas: Unremarkable. No pancreatic ductal dilatation or
surrounding inflammatory changes.

Spleen: Normal in size without focal abnormality.

Adrenals/Urinary Tract: Normal adrenal glands. Bilateral renal
cortical scarring is again noted. Unchanged appearance of right
kidney cysts. No suspicious mass or hydronephrosis identified.
Urinary bladder is unremarkable.

Stomach/Bowel: Wall thickening mass involving the body of stomach is
again. This measures 7.4 x 5.2 cm, image 53/2. Formally 7.7 x
cm. No bowel wall thickening, inflammation, or distension. Moderate
stool burden identified throughout the colon.

Vascular/Lymphatic: Aortic atherosclerosis without aneurysm. No
enlarged abdominopelvic lymph nodes.

Reproductive: Prostate is unremarkable.

Other: No free fluid or fluid collections. No suspicious peritoneal
nodule or mass noted.

Musculoskeletal: No acute or suspicious osseous findings. Stable
benign appearing sclerotic lesion within the posterior aspect of the
L2 vertebral body, image 83/6.
IMPRESSION: 1. Mild decrease in size of gastric mass.
2. Stable to mild decrease in size of multifocal liver metastases.
3. New 5 mm nodular density within the periphery of the left lung
base is nonspecific. Favor sequelae of inflammation or infection.
Attention on follow-up imaging is recommended.
4. Interval improvement in previously noted multifocal areas of
ground-glass attenuation compatible with resolving inflammation or
infection.
5. Aortic Atherosclerosis ([SU]-[SU]) and Emphysema ([SU]-[SU]).

## 2021-08-09 MED ORDER — SODIUM CHLORIDE 0.9 % IV SOLN
10.0000 mg | Freq: Once | INTRAVENOUS | Status: AC
  Administered 2021-08-09: 10 mg via INTRAVENOUS
  Filled 2021-08-09: qty 10

## 2021-08-09 MED ORDER — FAM-TRASTUZUMAB DERUXTECAN-NXKI CHEMO 100 MG IV SOLR
6.2500 mg/kg | Freq: Once | INTRAVENOUS | Status: AC
  Administered 2021-08-09: 400 mg via INTRAVENOUS
  Filled 2021-08-09: qty 20

## 2021-08-09 MED ORDER — DEXTROSE 5 % IV SOLN: Freq: Once | INTRAVENOUS | Status: AC

## 2021-08-09 MED ORDER — PALONOSETRON HCL INJECTION 0.25 MG/5ML
0.2500 mg | Freq: Once | INTRAVENOUS | Status: AC
  Administered 2021-08-09: 0.25 mg via INTRAVENOUS
  Filled 2021-08-09: qty 5

## 2021-08-09 MED ORDER — DIPHENHYDRAMINE HCL 25 MG PO CAPS
50.0000 mg | ORAL_CAPSULE | Freq: Once | ORAL | Status: AC
  Administered 2021-08-09: 50 mg via ORAL
  Filled 2021-08-09: qty 2

## 2021-08-09 MED ORDER — ACETAMINOPHEN 325 MG PO TABS
650.0000 mg | ORAL_TABLET | Freq: Once | ORAL | Status: AC
  Administered 2021-08-09: 650 mg via ORAL
  Filled 2021-08-09: qty 2

## 2021-08-09 MED ORDER — IOHEXOL 300 MG/ML  SOLN
100.0000 mL | Freq: Once | INTRAMUSCULAR | Status: AC | PRN
  Administered 2021-08-09: 100 mL via INTRAVENOUS

## 2021-08-09 MED ORDER — HEPARIN SOD (PORK) LOCK FLUSH 100 UNIT/ML IV SOLN
500.0000 [IU] | Freq: Once | INTRAVENOUS | Status: AC | PRN
  Administered 2021-08-09: 500 [IU]

## 2021-08-09 MED ORDER — SODIUM CHLORIDE 0.9% FLUSH
10.0000 mL | INTRAVENOUS | Status: DC | PRN
  Administered 2021-08-09: 10 mL

## 2021-08-09 NOTE — Patient Instructions (Signed)

## 2021-08-09 NOTE — Progress Notes (Signed)
Hematology and Oncology Follow Up Visit  Jimmy Blankenship [id number] November 29, 1950 70 y.o. 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  He looks quite good.  He feels good.  He had a wonderful Thanksgiving with his family.  We did do a CT scan on him today.  Thankfully, the CT scan showed improvement in his gastric primary and then the liver metastasis.  There is no evidence of obvious metastatic disease that was new.  He is on Xarelto.  He does have the ventricular thrombus.  We will have to follow-up with another echocardiogram to check the ventricular thrombus.  He has had no bleeding.  There is no change in bowel or bladder habits.  He has had no cough or shortness of breath.  There is been no leg swelling.  I am just happy that his quality of life is doing so well.  I forgot to mention that he did have a wonderful time in [place] back in October.  Currently, his performance status is ECOG 1.    Medications:  Allergies as of 08/09/2021   No Known Allergies      Medication List        Accurate as of August 09, 2021  1:14 PM. If you have any questions, ask your nurse or doctor.          atorvastatin 80 MG tablet Commonly known as: LIPITOR Take 1 tablet (80 mg total) by mouth daily.   dexamethasone 4 MG tablet Commonly known as: DECADRON Take 2 tablets (8 mg total) by mouth daily. Start the day after chemotherapy for 2 days.    diphenoxylate-atropine 2.5-0.025 MG tablet Commonly known as: LOMOTIL TAKE ONE TABLET BY MOUTH FOUR TIMES A DAY AS NEEDED FOR DIARRHEA OR LOOSE STOOLS   feeding supplement Liqd Take 237 mLs by mouth 2 (two) times daily between meals.   gabapentin 300 MG capsule Commonly known as: NEURONTIN Take 1 capsule (300 mg total) by mouth 2 (two) times daily.   glipiZIDE 5 MG tablet Commonly known as: GLUCOTROL Take 5 mg by mouth 2 (two) times daily before a meal.   lidocaine-prilocaine cream Commonly known as: EMLA Apply to affected area once   LORazepam 0.5 MG tablet Commonly known as: Ativan Take 1 tablet (0.5 mg total) by mouth every 6 (six) hours as needed (Nausea or vomiting).   megestrol 400 MG/10ML suspension Commonly known as: MEGACE Take 10 mLs (400 mg total) by mouth 2 (two) times daily.   metFORMIN 1000 MG tablet Commonly known as: GLUCOPHAGE Take 1 tablet by mouth 2 (two) times daily with a meal.   metoprolol tartrate 50 MG tablet Commonly known as: LOPRESSOR TAKE ONE TABLET BY MOUTH TWICE A DAY   ondansetron 8 MG tablet Commonly known as: Zofran Take 1 tablet (8 mg total) by mouth 2 (two) times daily as needed for refractory nausea / vomiting. Start on day 3 after chemo.   pantoprazole 40 MG tablet  Commonly known as: PROTONIX Take 1 tablet (40 mg total) by mouth 2 (two) times daily.   prochlorperazine 10 MG tablet Commonly known as: COMPAZINE Take 1 tablet (10 mg total) by mouth every 6 (six) hours as needed (Nausea or vomiting).   tamsulosin 0.4 MG Caps capsule Commonly known as: FLOMAX Take 0.4 mg by mouth at bedtime.   Xarelto 10 MG Tabs tablet Generic drug: rivaroxaban TAKE ONE TABLET BY MOUTH DAILY WITH SUPPER        Allergies: No Known Allergies  Past Medical History, Surgical history, Social history, and Family History were reviewed and updated.  Review of Systems: Review of Systems  Constitutional: Negative.   HENT: Negative.    Eyes:  Negative.   Respiratory: Negative.    Cardiovascular: Negative.   Gastrointestinal:  Positive for diarrhea.  Genitourinary: Negative.   Musculoskeletal: Negative.   Skin: Negative.   Neurological: Negative.   Endo/Heme/Allergies: Negative.   Psychiatric/Behavioral: Negative.      Physical Exam:  weight is 147 lb 0.6 oz (66.7 kg). His oral temperature is 98.1 F (36.7 C). His blood pressure is 119/74 and his pulse is 96. His respiration is 18 and oxygen saturation is 97%.   Wt Readings from Last 3 Encounters:  08/09/21 147 lb 0.6 oz (66.7 kg)  07/19/21 145 lb 1.6 oz (65.8 kg)  06/28/21 143 lb (64.9 kg)    Physical Exam Vitals reviewed.  HENT:     Head: Normocephalic and atraumatic.  Eyes:     Pupils: Pupils are equal, round, and reactive to light.  Cardiovascular:     Rate and Rhythm: Normal rate and regular rhythm.     Heart sounds: Normal heart sounds.  Pulmonary:     Effort: Pulmonary effort is normal.     Breath sounds: Normal breath sounds.  Abdominal:     General: Bowel sounds are normal.     Palpations: Abdomen is soft.  Musculoskeletal:        General: No tenderness or deformity. Normal range of motion.     Cervical back: Normal range of motion.  Lymphadenopathy:     Cervical: No cervical adenopathy.  Skin:    General: Skin is warm and dry.     Findings: No erythema or rash.  Neurological:     Mental Status: He is alert and oriented to person, place, and time.  Psychiatric:        Behavior: Behavior normal.        Thought Content: Thought content normal.        Judgment: Judgment normal.    Lab Results  Component Value Date   WBC 4.5 07/19/2021   HGB 11.1 (L) 07/19/2021   HCT 33.0 (L) 07/19/2021   MCV 93.2 07/19/2021   PLT 136 (L) 07/19/2021   Lab Results  Component Value Date   FERRITIN 1,451 (H) 07/19/2021   IRON 50 07/19/2021   TIBC 234 07/19/2021   UIBC 184 07/19/2021   IRONPCTSAT 21 07/19/2021   Lab Results  Component Value Date    RETICCTPCT 3.0 06/28/2021   RBC 3.54 (L) 07/19/2021   No results found for: KPAFRELGTCHN, LAMBDASER, KAPLAMBRATIO No results found for: IGGSERUM, IGA, IGMSERUM No results found for: TOTALPROTELP, ALBUMINELP, A1GS, A2GS, BETS, BETA2SER, GAMS, MSPIKE, SPEI   Chemistry      Component Value Date/Time   NA 139 07/19/2021 1130   NA 140 06/10/2020 0935   K 3.7 07/19/2021 1130   CL 109 07/19/2021 1130   CO2 21 (  L) 07/19/2021 1130   BUN 13 07/19/2021 1130   BUN 17 06/10/2020 0935   CREATININE 0.92 07/19/2021 1130      Component Value Date/Time   CALCIUM 9.4 07/19/2021 1130   ALKPHOS 72 07/19/2021 1130   AST 24 07/19/2021 1130   ALT 29 07/19/2021 1130   BILITOT 0.4 07/19/2021 1130       Impression and Plan: Mr. Boeding is a very pleasant 70 yo [name] gentleman with metastatic adenocarcinoma of the stomach. He has completed 7 cycles of chemotherapy along with immunotherapy and targeted therapy.  The Enhertu clearly is helping.  He is tolerating this incredibly well.  I am so happy that he is doing well with the Enhertu.  His quality of life is much better.  I am sure he will have a wonderful Christmas.  Again, we will recheck a echocardiogram on him so we can see how the ventricular thrombus appears.  I will plan to see him back in another 3 weeks.     Volanda Napoleon, MD 11/30/20221:14 PM

## 2021-08-09 NOTE — Patient Instructions (Signed)
[facility]  Discharge Instructions: Thank you for choosing [facility] to provide your oncology and hematology care.   If you have a lab appointment with the Cancer Center, please go directly to the Cancer Center and check in at the registration area.  Wear comfortable clothing and clothing appropriate for easy access to any Portacath or PICC line.   We strive to give you quality time with your provider. You may need to reschedule your appointment if you arrive late (15 or more minutes).  Arriving late affects you and other patients whose appointments are after yours.  Also, if you miss three or more appointments without notifying the office, you may be dismissed from the clinic at the provider's discretion.      For prescription refill requests, have your pharmacy contact our office and allow 72 hours for refills to be completed.    Today you received the following chemotherapy and/or immunotherapy agents Enhertu      To help prevent nausea and vomiting after your treatment, we encourage you to take your nausea medication as directed.  BELOW ARE SYMPTOMS THAT SHOULD BE REPORTED IMMEDIATELY: *FEVER GREATER THAN 100.4 F (38 C) OR HIGHER *CHILLS OR SWEATING *NAUSEA AND VOMITING THAT IS NOT CONTROLLED WITH YOUR NAUSEA MEDICATION *UNUSUAL SHORTNESS OF BREATH *UNUSUAL BRUISING OR BLEEDING *URINARY PROBLEMS (pain or burning when urinating, or frequent urination) *BOWEL PROBLEMS (unusual diarrhea, constipation, pain near the anus) TENDERNESS IN MOUTH AND THROAT WITH OR WITHOUT PRESENCE OF ULCERS (sore throat, sores in mouth, or a toothache) UNUSUAL RASH, SWELLING OR PAIN  UNUSUAL VAGINAL DISCHARGE OR ITCHING   Items with * indicate a potential emergency and should be followed up as soon as possible or go to the Emergency Department if any problems should occur.  Please show the CHEMOTHERAPY ALERT CARD or IMMUNOTHERAPY ALERT CARD at check-in to the  Emergency Department and triage nurse. Should you have questions after your visit or need to cancel or reschedule your appointment, please contact [facility]  [phone] and follow the prompts.  Office hours are 8:00 a.m. to 4:30 p.m. Monday - Friday. Please note that voicemails left after 4:00 p.m. may not be returned until the following business day.  We are closed weekends and major holidays. You have access to a nurse at all times for urgent questions. Please call the main number to the clinic [phone] and follow the prompts.  For any non-urgent questions, you may also contact your provider using MyChart. We now offer e-Visits for anyone 18 and older to request care online for non-urgent symptoms. For details visit [email or web address].   Also download the MyChart app! Go to the app store, search "MyChart", open the app, select [facility], and log in with your MyChart username and password.  Due to Covid, a mask is required upon entering the hospital/clinic. If you do not have a mask, one will be given to you upon arrival. For doctor visits, patients may have 1 support person aged 18 or older with them. For treatment visits, patients cannot have anyone with them due to current Covid guidelines and our immunocompromised population.  

## 2021-08-10 LAB — IRON AND TIBC
Iron: 54 ug/dL (ref 42–163)
Saturation Ratios: 21 % (ref 20–55)
TIBC: 256 ug/dL (ref 202–409)
UIBC: 202 ug/dL (ref 117–376)

## 2021-08-10 LAB — FERRITIN: Ferritin: 1130 ng/mL — ABNORMAL HIGH (ref 24–336)

## 2021-08-12 ENCOUNTER — Other Ambulatory Visit (HOSPITAL_BASED_OUTPATIENT_CLINIC_OR_DEPARTMENT_OTHER): Payer: Self-pay

## 2021-08-12 MED ORDER — PFIZER COVID-19 VAC BIVALENT 30 MCG/0.3ML IM SUSP
INTRAMUSCULAR | 0 refills | Status: DC
  Filled 2021-08-12: qty 0.3, 1d supply, fill #0

## 2021-08-14 ENCOUNTER — Other Ambulatory Visit (HOSPITAL_BASED_OUTPATIENT_CLINIC_OR_DEPARTMENT_OTHER): Payer: Self-pay

## 2021-08-28 ENCOUNTER — Other Ambulatory Visit: Payer: Self-pay

## 2021-08-28 ENCOUNTER — Ambulatory Visit (HOSPITAL_COMMUNITY): Payer: Medicare Other | Attending: Cardiology

## 2021-08-28 DIAGNOSIS — I513 Intracardiac thrombosis, not elsewhere classified: Secondary | ICD-10-CM | POA: Insufficient documentation

## 2021-08-28 LAB — ECHOCARDIOGRAM COMPLETE
Area-P 1/2: 3.89 cm2
S' Lateral: 3.55 cm

## 2021-08-28 MED ORDER — PERFLUTREN LIPID MICROSPHERE
2.0000 mL | INTRAVENOUS | Status: AC | PRN
Start: 2021-08-28 — End: 2021-08-28
  Administered 2021-08-28: 2 mL via INTRAVENOUS

## 2021-08-29 ENCOUNTER — Telehealth: Payer: Self-pay

## 2021-08-29 NOTE — Telephone Encounter (Signed)
-----   Message from Volanda Napoleon, MD sent at 08/29/2021  8:30 AM EST ----- Call his daughter --tell her that her dad has heart function is better.  I am happy about this.

## 2021-08-29 NOTE — Telephone Encounter (Signed)
Called and informed patient of lab results, patient verbalized understanding and denies any questions or concerns at this time.   

## 2021-08-30 ENCOUNTER — Inpatient Hospital Stay (HOSPITAL_BASED_OUTPATIENT_CLINIC_OR_DEPARTMENT_OTHER): Payer: Medicare Other | Admitting: Hematology & Oncology

## 2021-08-30 ENCOUNTER — Inpatient Hospital Stay: Payer: Medicare Other

## 2021-08-30 ENCOUNTER — Inpatient Hospital Stay: Payer: Medicare Other | Attending: Hematology & Oncology

## 2021-08-30 ENCOUNTER — Other Ambulatory Visit: Payer: Self-pay

## 2021-08-30 ENCOUNTER — Other Ambulatory Visit (HOSPITAL_COMMUNITY): Payer: Medicare Other

## 2021-08-30 ENCOUNTER — Encounter: Payer: Self-pay | Admitting: Hematology & Oncology

## 2021-08-30 VITALS — BP 128/58 | HR 78 | Resp 18

## 2021-08-30 VITALS — BP 118/71 | HR 79 | Temp 97.9°F | Resp 18 | Ht 69.0 in | Wt 145.0 lb

## 2021-08-30 DIAGNOSIS — C7951 Secondary malignant neoplasm of bone: Secondary | ICD-10-CM | POA: Insufficient documentation

## 2021-08-30 DIAGNOSIS — Z79899 Other long term (current) drug therapy: Secondary | ICD-10-CM | POA: Diagnosis not present

## 2021-08-30 DIAGNOSIS — C787 Secondary malignant neoplasm of liver and intrahepatic bile duct: Secondary | ICD-10-CM | POA: Diagnosis not present

## 2021-08-30 DIAGNOSIS — C165 Malignant neoplasm of lesser curvature of stomach, unspecified: Secondary | ICD-10-CM | POA: Insufficient documentation

## 2021-08-30 DIAGNOSIS — K922 Gastrointestinal hemorrhage, unspecified: Secondary | ICD-10-CM | POA: Insufficient documentation

## 2021-08-30 DIAGNOSIS — C799 Secondary malignant neoplasm of unspecified site: Secondary | ICD-10-CM

## 2021-08-30 DIAGNOSIS — R197 Diarrhea, unspecified: Secondary | ICD-10-CM | POA: Insufficient documentation

## 2021-08-30 DIAGNOSIS — L298 Other pruritus: Secondary | ICD-10-CM | POA: Diagnosis not present

## 2021-08-30 DIAGNOSIS — Z5112 Encounter for antineoplastic immunotherapy: Secondary | ICD-10-CM | POA: Insufficient documentation

## 2021-08-30 DIAGNOSIS — Z7901 Long term (current) use of anticoagulants: Secondary | ICD-10-CM | POA: Insufficient documentation

## 2021-08-30 DIAGNOSIS — D500 Iron deficiency anemia secondary to blood loss (chronic): Secondary | ICD-10-CM | POA: Insufficient documentation

## 2021-08-30 LAB — CBC WITH DIFFERENTIAL (CANCER CENTER ONLY)
Abs Immature Granulocytes: 0.04 10*3/uL (ref 0.00–0.07)
Basophils Absolute: 0 10*3/uL (ref 0.0–0.1)
Basophils Relative: 1 %
Eosinophils Absolute: 0.2 10*3/uL (ref 0.0–0.5)
Eosinophils Relative: 3 %
HCT: 29.8 % — ABNORMAL LOW (ref 39.0–52.0)
Hemoglobin: 9.7 g/dL — ABNORMAL LOW (ref 13.0–17.0)
Immature Granulocytes: 1 %
Lymphocytes Relative: 27 %
Lymphs Abs: 1.2 10*3/uL (ref 0.7–4.0)
MCH: 31.3 pg (ref 26.0–34.0)
MCHC: 32.6 g/dL (ref 30.0–36.0)
MCV: 96.1 fL (ref 80.0–100.0)
Monocytes Absolute: 0.7 10*3/uL (ref 0.1–1.0)
Monocytes Relative: 15 %
Neutro Abs: 2.3 10*3/uL (ref 1.7–7.7)
Neutrophils Relative %: 53 %
Platelet Count: 137 10*3/uL — ABNORMAL LOW (ref 150–400)
RBC: 3.1 MIL/uL — ABNORMAL LOW (ref 4.22–5.81)
RDW: 15.9 % — ABNORMAL HIGH (ref 11.5–15.5)
WBC Count: 4.4 10*3/uL (ref 4.0–10.5)
nRBC: 0 % (ref 0.0–0.2)

## 2021-08-30 LAB — CMP (CANCER CENTER ONLY)
ALT: 27 U/L (ref 0–44)
AST: 23 U/L (ref 15–41)
Albumin: 3.6 g/dL (ref 3.5–5.0)
Alkaline Phosphatase: 80 U/L (ref 38–126)
Anion gap: 11 (ref 5–15)
BUN: 13 mg/dL (ref 8–23)
CO2: 21 mmol/L — ABNORMAL LOW (ref 22–32)
Calcium: 9.5 mg/dL (ref 8.9–10.3)
Chloride: 109 mmol/L (ref 98–111)
Creatinine: 0.9 mg/dL (ref 0.61–1.24)
GFR, Estimated: >60 mL/min (ref >60)
Glucose, Bld: 127 mg/dL — ABNORMAL HIGH (ref 70–99)
Potassium: 3.5 mmol/L (ref 3.5–5.1)
Sodium: 141 mmol/L (ref 135–145)
Total Bilirubin: 0.6 mg/dL (ref 0.3–1.2)
Total Protein: 5.9 g/dL — ABNORMAL LOW (ref 6.5–8.1)

## 2021-08-30 LAB — LACTATE DEHYDROGENASE: LDH: 190 U/L (ref 98–192)

## 2021-08-30 MED ORDER — DIPHENHYDRAMINE HCL 25 MG PO CAPS
50.0000 mg | ORAL_CAPSULE | Freq: Once | ORAL | Status: AC
  Administered 2021-08-30: 13:00:00 50 mg via ORAL
  Filled 2021-08-30: qty 2

## 2021-08-30 MED ORDER — SODIUM CHLORIDE 0.9% FLUSH
10.0000 mL | INTRAVENOUS | Status: DC | PRN
  Administered 2021-08-30: 15:00:00 10 mL

## 2021-08-30 MED ORDER — SODIUM CHLORIDE 0.9 % IV SOLN
150.0000 mg | Freq: Once | INTRAVENOUS | Status: AC
  Administered 2021-08-30: 14:00:00 150 mg via INTRAVENOUS
  Filled 2021-08-30: qty 150

## 2021-08-30 MED ORDER — FAM-TRASTUZUMAB DERUXTECAN-NXKI CHEMO 100 MG IV SOLR
6.2500 mg/kg | Freq: Once | INTRAVENOUS | Status: AC
  Administered 2021-08-30: 14:00:00 400 mg via INTRAVENOUS
  Filled 2021-08-30: qty 20

## 2021-08-30 MED ORDER — HYDROXYZINE HCL 10 MG PO TABS: 10.0000 mg | ORAL_TABLET | Freq: Three times a day (TID) | ORAL | 0 refills | Status: DC | PRN

## 2021-08-30 MED ORDER — PALONOSETRON HCL INJECTION 0.25 MG/5ML
0.2500 mg | Freq: Once | INTRAVENOUS | Status: AC
  Administered 2021-08-30: 13:00:00 0.25 mg via INTRAVENOUS
  Filled 2021-08-30: qty 5

## 2021-08-30 MED ORDER — ACETAMINOPHEN 325 MG PO TABS
650.0000 mg | ORAL_TABLET | Freq: Once | ORAL | Status: AC
  Administered 2021-08-30: 13:00:00 650 mg via ORAL
  Filled 2021-08-30: qty 2

## 2021-08-30 MED ORDER — SODIUM CHLORIDE 0.9 % IV SOLN
150.0000 mg | Freq: Once | INTRAVENOUS | Status: DC
Start: 2021-08-30 — End: 2021-08-30

## 2021-08-30 MED ORDER — DEXTROSE 5 % IV SOLN: Freq: Once | INTRAVENOUS | Status: AC

## 2021-08-30 MED ORDER — SODIUM CHLORIDE 0.9 % IV SOLN
10.0000 mg | Freq: Once | INTRAVENOUS | Status: AC
  Administered 2021-08-30: 13:00:00 10 mg via INTRAVENOUS
  Filled 2021-08-30: qty 10

## 2021-08-30 MED ORDER — HEPARIN SOD (PORK) LOCK FLUSH 100 UNIT/ML IV SOLN
500.0000 [IU] | Freq: Once | INTRAVENOUS | Status: AC | PRN
  Administered 2021-08-30: 15:00:00 500 [IU]

## 2021-08-30 NOTE — Patient Instructions (Signed)

## 2021-08-30 NOTE — Progress Notes (Signed)
Hematology and Oncology Follow Up Visit  Sriman Tally [id number] 05-15-1951 70 y.o. 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 Erhart is here today with his interpreter for follow-up.  He is doing pretty well.  His main complaint has been pruritus.  There is been no rash.  We will see about putting him on some Atarax to try to help with the pruritus.  I am unsure if this is from the Enhertu.  We did do an echocardiogram on him.  This showed an ejection fraction of 40-45%.  Most importantly, is that he does not have a thrombus in the ventricle now.  I will keep him on the Xarelto.  His appetite is good.  He had no problems with nausea or vomiting.  There is been no change in bowel or bladder habits.  He has had no leg swelling.  He has had no fever.  He has had no cough or shortness of breath.  Overall, I would say his performance status is probably ECOG 1.     Medications:  Allergies as of 08/30/2021   No Known Allergies      Medication List        Accurate as of August 30, 2021 12:14 PM. If you have any questions, ask your nurse or doctor.          STOP taking these medications    Pfizer COVID-19 Vac Bivalent injection Generic drug: COVID-19 mRNA bivalent vaccine Therapist, music) Stopped by: Volanda Napoleon, MD       TAKE these medications    atorvastatin 80 MG tablet Commonly known as: LIPITOR Take 1 tablet (80 mg total) by mouth daily.   dexamethasone 4 MG tablet Commonly known as:  DECADRON Take 2 tablets (8 mg total) by mouth daily. Start the day after chemotherapy for 2 days.   diphenoxylate-atropine 2.5-0.025 MG tablet Commonly known as: LOMOTIL TAKE ONE TABLET BY MOUTH FOUR TIMES A DAY AS NEEDED FOR DIARRHEA OR LOOSE STOOLS   feeding supplement Liqd Take 237 mLs by mouth 2 (two) times daily between meals.   gabapentin 300 MG capsule Commonly known as: NEURONTIN Take 1 capsule (300 mg total) by mouth 2 (two) times daily.   glipiZIDE 5 MG tablet Commonly known as: GLUCOTROL Take 5 mg by mouth 2 (two) times daily before a meal.   Lantus SoloStar 100 UNIT/ML Solostar Pen Generic drug: insulin glargine Inject into the skin.   lidocaine-prilocaine cream Commonly known as: EMLA Apply to affected area once   LORazepam 0.5 MG tablet Commonly known as: Ativan Take 1 tablet (0.5 mg total) by mouth every 6 (six) hours as needed (Nausea or vomiting).   megestrol 400 MG/10ML suspension Commonly known as: MEGACE Take 10 mLs (400 mg total) by mouth 2 (two) times daily.   metFORMIN 1000 MG tablet Commonly known as: GLUCOPHAGE Take 1 tablet by mouth 2 (two) times daily with a meal.   metoprolol tartrate 50 MG tablet Commonly known as: LOPRESSOR TAKE ONE TABLET BY MOUTH  TWICE A DAY   ondansetron 8 MG tablet Commonly known as: Zofran Take 1 tablet (8 mg total) by mouth 2 (two) times daily as needed for refractory nausea / vomiting. Start on day 3 after chemo.   pantoprazole 40 MG tablet Commonly known as: PROTONIX Take 1 tablet (40 mg total) by mouth 2 (two) times daily.   prochlorperazine 10 MG tablet Commonly known as: COMPAZINE Take 1 tablet (10 mg total) by mouth every 6 (six) hours as needed (Nausea or vomiting).   tamsulosin 0.4 MG Caps capsule Commonly known as: FLOMAX Take 0.4 mg by mouth at bedtime.   Xarelto 10 MG Tabs tablet Generic drug: rivaroxaban TAKE ONE TABLET BY MOUTH DAILY WITH SUPPER        Allergies: No Known  Allergies  Past Medical History, Surgical history, Social history, and Family History were reviewed and updated.  Review of Systems: Review of Systems  Constitutional: Negative.   HENT: Negative.    Eyes: Negative.   Respiratory: Negative.    Cardiovascular: Negative.   Gastrointestinal:  Positive for diarrhea.  Genitourinary: Negative.   Musculoskeletal: Negative.   Skin: Negative.   Neurological: Negative.   Endo/Heme/Allergies: Negative.   Psychiatric/Behavioral: Negative.      Physical Exam:  height is _0  (1.753 m) and weight is 145 lb (65.8 kg). His oral temperature is 97.9 F (36.6 C). His blood pressure is 118/71 and his pulse is 79. His respiration is 18 and oxygen saturation is 99%.   Wt Readings from Last 3 Encounters:  08/30/21 145 lb (65.8 kg)  08/09/21 147 lb 0.6 oz (66.7 kg)  07/19/21 145 lb 1.6 oz (65.8 kg)    Physical Exam Vitals reviewed.  HENT:     Head: Normocephalic and atraumatic.  Eyes:     Pupils: Pupils are equal, round, and reactive to light.  Cardiovascular:     Rate and Rhythm: Normal rate and regular rhythm.     Heart sounds: Normal heart sounds.  Pulmonary:     Effort: Pulmonary effort is normal.     Breath sounds: Normal breath sounds.  Abdominal:     General: Bowel sounds are normal.     Palpations: Abdomen is soft.  Musculoskeletal:        General: No tenderness or deformity. Normal range of motion.     Cervical back: Normal range of motion.  Lymphadenopathy:     Cervical: No cervical adenopathy.  Skin:    General: Skin is warm and dry.     Findings: No erythema or rash.  Neurological:     Mental Status: He is alert and oriented to person, place, and time.  Psychiatric:        Behavior: Behavior normal.        Thought Content: Thought content normal.        Judgment: Judgment normal.    Lab Results  Component Value Date   WBC 4.4 08/30/2021   HGB 9.7 (L) 08/30/2021   HCT 29.8 (L) 08/30/2021   MCV 96.1 08/30/2021    PLT 137 (L) 08/30/2021   Lab Results  Component Value Date   FERRITIN 1,130 (H) 08/09/2021   IRON 54 08/09/2021   TIBC 256 08/09/2021   UIBC 202 08/09/2021   IRONPCTSAT 21 08/09/2021   Lab Results  Component Value Date   RETICCTPCT 3.0 06/28/2021   RBC 3.10 (L) 08/30/2021   No results found for: KPAFRELGTCHN, LAMBDASER, KAPLAMBRATIO No results found for: IGGSERUM, IGA, IGMSERUM No results found  for: Odetta Pink, SPEI   Chemistry      Component Value Date/Time   NA 141 08/30/2021 1130   NA 140 06/10/2020 0935   K 3.5 08/30/2021 1130   CL 109 08/30/2021 1130   CO2 21 (L) 08/30/2021 1130   BUN 13 08/30/2021 1130   BUN 17 06/10/2020 0935   CREATININE 0.90 08/30/2021 1130      Component Value Date/Time   CALCIUM 9.5 08/30/2021 1130   ALKPHOS 80 08/30/2021 1130   AST 23 08/30/2021 1130   ALT 27 08/30/2021 1130   BILITOT 0.6 08/30/2021 1130       Impression and Plan: Mr. Vivier is a very pleasant 70 yo [name] gentleman with metastatic adenocarcinoma of the stomach. He has completed 7 cycles of chemotherapy along with immunotherapy and targeted therapy.  We found that he began to have some disease progression.  He also was having a hard time tolerating treatment.  The Enhertu clearly has been helping.  He is tolerating this incredibly well.  I am not sure if the pruritus is from the Enhertu.  Again, we will try him on some Atarax.  I am just glad that his quality life is doing well.  I know he will enjoy Christmas.  We will go ahead and plan for his eighth cycle of treatment today.  We will then plan to go get him back in another 3 weeks.  His last CT scan was done in November.     Volanda Napoleon, MD 12/21/202212:14 PM

## 2021-08-30 NOTE — Addendum Note (Signed)
Addended by: Burney Gauze R on: 08/30/2021 12:35 PM   Modules accepted: Orders

## 2021-08-30 NOTE — Patient Instructions (Signed)
[facility]  Discharge Instructions: Thank you for choosing [facility] to provide your oncology and hematology care.   If you have a lab appointment with the Cancer Center, please go directly to the Cancer Center and check in at the registration area.  Wear comfortable clothing and clothing appropriate for easy access to any Portacath or PICC line.   We strive to give you quality time with your provider. You may need to reschedule your appointment if you arrive late (15 or more minutes).  Arriving late affects you and other patients whose appointments are after yours.  Also, if you miss three or more appointments without notifying the office, you may be dismissed from the clinic at the provider's discretion.      For prescription refill requests, have your pharmacy contact our office and allow 72 hours for refills to be completed.    Today you received the following chemotherapy and/or immunotherapy agents Enhertu      To help prevent nausea and vomiting after your treatment, we encourage you to take your nausea medication as directed.  BELOW ARE SYMPTOMS THAT SHOULD BE REPORTED IMMEDIATELY: *FEVER GREATER THAN 100.4 F (38 C) OR HIGHER *CHILLS OR SWEATING *NAUSEA AND VOMITING THAT IS NOT CONTROLLED WITH YOUR NAUSEA MEDICATION *UNUSUAL SHORTNESS OF BREATH *UNUSUAL BRUISING OR BLEEDING *URINARY PROBLEMS (pain or burning when urinating, or frequent urination) *BOWEL PROBLEMS (unusual diarrhea, constipation, pain near the anus) TENDERNESS IN MOUTH AND THROAT WITH OR WITHOUT PRESENCE OF ULCERS (sore throat, sores in mouth, or a toothache) UNUSUAL RASH, SWELLING OR PAIN  UNUSUAL VAGINAL DISCHARGE OR ITCHING   Items with * indicate a potential emergency and should be followed up as soon as possible or go to the Emergency Department if any problems should occur.  Please show the CHEMOTHERAPY ALERT CARD or IMMUNOTHERAPY ALERT CARD at check-in to the  Emergency Department and triage nurse. Should you have questions after your visit or need to cancel or reschedule your appointment, please contact [facility]  [phone] and follow the prompts.  Office hours are 8:00 a.m. to 4:30 p.m. Monday - Friday. Please note that voicemails left after 4:00 p.m. may not be returned until the following business day.  We are closed weekends and major holidays. You have access to a nurse at all times for urgent questions. Please call the main number to the clinic [phone] and follow the prompts.  For any non-urgent questions, you may also contact your provider using MyChart. We now offer e-Visits for anyone 18 and older to request care online for non-urgent symptoms. For details visit [email or web address].   Also download the MyChart app! Go to the app store, search "MyChart", open the app, select [facility], and log in with your MyChart username and password.  Due to Covid, a mask is required upon entering the hospital/clinic. If you do not have a mask, one will be given to you upon arrival. For doctor visits, patients may have 1 support person aged 18 or older with them. For treatment visits, patients cannot have anyone with them due to current Covid guidelines and our immunocompromised population.  

## 2021-09-01 ENCOUNTER — Telehealth: Payer: Self-pay | Admitting: Hematology & Oncology

## 2021-09-01 NOTE — Telephone Encounter (Signed)
Contacted patient daughter to schedule per los 12/21, daughter is aware of appts on 1/9.

## 2021-09-18 ENCOUNTER — Other Ambulatory Visit: Payer: Self-pay | Admitting: Family

## 2021-09-18 ENCOUNTER — Inpatient Hospital Stay: Payer: Medicare Other

## 2021-09-18 ENCOUNTER — Inpatient Hospital Stay: Payer: Medicare Other | Attending: Hematology & Oncology

## 2021-09-18 ENCOUNTER — Telehealth: Payer: Self-pay | Admitting: Family

## 2021-09-18 ENCOUNTER — Encounter: Payer: Self-pay | Admitting: Family

## 2021-09-18 ENCOUNTER — Other Ambulatory Visit: Payer: Self-pay

## 2021-09-18 ENCOUNTER — Inpatient Hospital Stay (HOSPITAL_BASED_OUTPATIENT_CLINIC_OR_DEPARTMENT_OTHER): Payer: Medicare Other | Admitting: Family

## 2021-09-18 VITALS — BP 119/67 | HR 81 | Temp 98.0°F | Resp 20

## 2021-09-18 VITALS — BP 149/91 | HR 93 | Temp 97.9°F | Resp 16 | Ht 69.0 in | Wt 140.0 lb

## 2021-09-18 DIAGNOSIS — C169 Malignant neoplasm of stomach, unspecified: Secondary | ICD-10-CM

## 2021-09-18 DIAGNOSIS — M792 Neuralgia and neuritis, unspecified: Secondary | ICD-10-CM

## 2021-09-18 DIAGNOSIS — C799 Secondary malignant neoplasm of unspecified site: Secondary | ICD-10-CM

## 2021-09-18 DIAGNOSIS — L2989 Other pruritus: Secondary | ICD-10-CM

## 2021-09-18 DIAGNOSIS — C165 Malignant neoplasm of lesser curvature of stomach, unspecified: Secondary | ICD-10-CM

## 2021-09-18 DIAGNOSIS — K922 Gastrointestinal hemorrhage, unspecified: Secondary | ICD-10-CM | POA: Diagnosis not present

## 2021-09-18 DIAGNOSIS — G629 Polyneuropathy, unspecified: Secondary | ICD-10-CM | POA: Diagnosis not present

## 2021-09-18 DIAGNOSIS — C787 Secondary malignant neoplasm of liver and intrahepatic bile duct: Secondary | ICD-10-CM | POA: Insufficient documentation

## 2021-09-18 DIAGNOSIS — Z7901 Long term (current) use of anticoagulants: Secondary | ICD-10-CM | POA: Diagnosis not present

## 2021-09-18 DIAGNOSIS — L298 Other pruritus: Secondary | ICD-10-CM

## 2021-09-18 DIAGNOSIS — Z5112 Encounter for antineoplastic immunotherapy: Secondary | ICD-10-CM | POA: Diagnosis present

## 2021-09-18 DIAGNOSIS — D500 Iron deficiency anemia secondary to blood loss (chronic): Secondary | ICD-10-CM | POA: Insufficient documentation

## 2021-09-18 DIAGNOSIS — Z79899 Other long term (current) drug therapy: Secondary | ICD-10-CM | POA: Insufficient documentation

## 2021-09-18 DIAGNOSIS — C7951 Secondary malignant neoplasm of bone: Secondary | ICD-10-CM | POA: Diagnosis not present

## 2021-09-18 LAB — CBC WITH DIFFERENTIAL (CANCER CENTER ONLY)
Abs Immature Granulocytes: 0.03 10*3/uL (ref 0.00–0.07)
Basophils Absolute: 0.1 10*3/uL (ref 0.0–0.1)
Basophils Relative: 1 %
Eosinophils Absolute: 0.3 10*3/uL (ref 0.0–0.5)
Eosinophils Relative: 6 %
HCT: 29 % — ABNORMAL LOW (ref 39.0–52.0)
Hemoglobin: 9.5 g/dL — ABNORMAL LOW (ref 13.0–17.0)
Immature Granulocytes: 1 %
Lymphocytes Relative: 34 %
Lymphs Abs: 1.7 10*3/uL (ref 0.7–4.0)
MCH: 32.6 pg (ref 26.0–34.0)
MCHC: 32.8 g/dL (ref 30.0–36.0)
MCV: 99.7 fL (ref 80.0–100.0)
Monocytes Absolute: 0.6 10*3/uL (ref 0.1–1.0)
Monocytes Relative: 12 %
Neutro Abs: 2.4 10*3/uL (ref 1.7–7.7)
Neutrophils Relative %: 46 %
Platelet Count: 228 10*3/uL (ref 150–400)
RBC: 2.91 MIL/uL — ABNORMAL LOW (ref 4.22–5.81)
RDW: 17 % — ABNORMAL HIGH (ref 11.5–15.5)
WBC Count: 5.1 10*3/uL (ref 4.0–10.5)
nRBC: 0.6 % — ABNORMAL HIGH (ref 0.0–0.2)

## 2021-09-18 LAB — CMP (CANCER CENTER ONLY)
ALT: 22 U/L (ref 0–44)
AST: 23 U/L (ref 15–41)
Albumin: 3.7 g/dL (ref 3.5–5.0)
Alkaline Phosphatase: 105 U/L (ref 38–126)
Anion gap: 10 (ref 5–15)
BUN: 11 mg/dL (ref 8–23)
CO2: 22 mmol/L (ref 22–32)
Calcium: 9.4 mg/dL (ref 8.9–10.3)
Chloride: 109 mmol/L (ref 98–111)
Creatinine: 0.92 mg/dL (ref 0.61–1.24)
GFR, Estimated: >60 mL/min (ref >60)
Glucose, Bld: 196 mg/dL — ABNORMAL HIGH (ref 70–99)
Potassium: 3.2 mmol/L — ABNORMAL LOW (ref 3.5–5.1)
Sodium: 141 mmol/L (ref 135–145)
Total Bilirubin: 0.6 mg/dL (ref 0.3–1.2)
Total Protein: 5.9 g/dL — ABNORMAL LOW (ref 6.5–8.1)

## 2021-09-18 LAB — IRON AND IRON BINDING CAPACITY (CC-WL,HP ONLY)
Iron: 69 ug/dL (ref 45–182)
Saturation Ratios: 24 % (ref 17.9–39.5)
TIBC: 283 ug/dL (ref 250–450)
UIBC: 214 ug/dL (ref 117–376)

## 2021-09-18 LAB — FERRITIN: Ferritin: 1034 ng/mL — ABNORMAL HIGH (ref 24–336)

## 2021-09-18 LAB — LACTATE DEHYDROGENASE: LDH: 162 U/L (ref 98–192)

## 2021-09-18 MED ORDER — DIPHENHYDRAMINE HCL 25 MG PO CAPS
50.0000 mg | ORAL_CAPSULE | Freq: Once | ORAL | Status: AC
  Administered 2021-09-18: 50 mg via ORAL
  Filled 2021-09-18: qty 2

## 2021-09-18 MED ORDER — ACETAMINOPHEN 325 MG PO TABS
650.0000 mg | ORAL_TABLET | Freq: Once | ORAL | Status: AC
  Administered 2021-09-18: 650 mg via ORAL
  Filled 2021-09-18: qty 2

## 2021-09-18 MED ORDER — FAM-TRASTUZUMAB DERUXTECAN-NXKI CHEMO 100 MG IV SOLR
6.2500 mg/kg | Freq: Once | INTRAVENOUS | Status: AC
  Administered 2021-09-18: 400 mg via INTRAVENOUS
  Filled 2021-09-18: qty 20

## 2021-09-18 MED ORDER — SODIUM CHLORIDE 0.9 % IV SOLN
10.0000 mg | Freq: Once | INTRAVENOUS | Status: AC
  Administered 2021-09-18: 10 mg via INTRAVENOUS
  Filled 2021-09-18: qty 10

## 2021-09-18 MED ORDER — ACETAMINOPHEN 325 MG PO TABS
ORAL_TABLET | ORAL | Status: AC
  Filled 2021-09-18: qty 1

## 2021-09-18 MED ORDER — DEXTROSE 5 % IV SOLN: Freq: Once | INTRAVENOUS | Status: AC

## 2021-09-18 MED ORDER — GABAPENTIN 300 MG PO CAPS: 300.0000 mg | ORAL_CAPSULE | Freq: Four times a day (QID) | ORAL | 1 refills | Status: DC

## 2021-09-18 MED ORDER — PALONOSETRON HCL INJECTION 0.25 MG/5ML
0.2500 mg | Freq: Once | INTRAVENOUS | Status: AC
  Administered 2021-09-18: 0.25 mg via INTRAVENOUS
  Filled 2021-09-18: qty 5

## 2021-09-18 MED ORDER — HEPARIN SOD (PORK) LOCK FLUSH 100 UNIT/ML IV SOLN
500.0000 [IU] | Freq: Once | INTRAVENOUS | Status: AC | PRN
  Administered 2021-09-18: 500 [IU]

## 2021-09-18 MED ORDER — SODIUM CHLORIDE 0.9% FLUSH
10.0000 mL | INTRAVENOUS | Status: DC | PRN
  Administered 2021-09-18: 10 mL

## 2021-09-18 NOTE — Patient Instructions (Signed)

## 2021-09-18 NOTE — Progress Notes (Signed)
Hematology and Oncology Follow Up Visit  Nicholson Starace [id number] [birth year] 71 y.o. 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 Gains is here today with his interpretor for follow-up and treatment. He is still having itching on his chest arms and back. He has nails scratch marks all over his chest and back indicating where he has been itching. No raised rash visible at this time. He states that the Atarax did not help with his symptoms.  He has peripheral neuropathy in the bottoms of both feet. He is taking Neuronin 300 mg PO BID. This is unchanged from baseline.  No fever, chills, n/v, cough, dizziness, SOB, chest pain, palpitations, abdominal pain or changes in bowel or bladder habits.  No blood loss noted. No bruising or petechiae.  No swelling or tenderness in his extremities.  No falls or syncope to report.  He has maintained a good appetite but is eating healthier in order to regulate his blood sugars. His weight is down 5 lbs this visit. He states that he has a good appetite and is staying well hydrated throughout the day.   ECOG Performance Status: 1 - Symptomatic but completely ambulatory  Medications:  Allergies as of 09/18/2021   No Known Allergies      Medication List        Accurate as of September 18, 2021  8:39 AM. If you have any questions, ask your nurse or doctor.          atorvastatin 80 MG tablet Commonly known as: LIPITOR Take 1 tablet (80 mg total) by mouth daily.   dexamethasone 4 MG  tablet Commonly known as: DECADRON Take 2 tablets (8 mg total) by mouth daily. Start the day after chemotherapy for 2 days.   diphenoxylate-atropine 2.5-0.025 MG tablet Commonly known as: LOMOTIL TAKE ONE TABLET BY MOUTH FOUR TIMES A DAY AS NEEDED FOR DIARRHEA OR LOOSE STOOLS   feeding supplement Liqd Take 237 mLs by mouth 2 (two) times daily between meals.   gabapentin 300 MG capsule Commonly known as: NEURONTIN Take 1 capsule (300 mg total) by mouth 2 (two) times daily.   glipiZIDE 5 MG tablet Commonly known as: GLUCOTROL Take 5 mg by mouth 2 (two) times daily before a meal.   hydrOXYzine 10 MG tablet Commonly known as: ATARAX Take 1 tablet (10 mg total) by mouth 3 (three) times daily as needed for itching.   Lantus SoloStar 100 UNIT/ML Solostar Pen Generic drug: insulin glargine Inject into the skin.   lidocaine-prilocaine cream Commonly known as: EMLA Apply to affected area once   LORazepam 0.5 MG tablet Commonly known as: Ativan Take 1 tablet (0.5 mg total) by mouth every 6 (six) hours as needed (Nausea or vomiting).   megestrol 400 MG/10ML suspension Commonly known as: MEGACE Take 10 mLs (400 mg total) by mouth 2 (two) times daily.   metFORMIN 1000 MG tablet Commonly known as: GLUCOPHAGE Take 1 tablet by mouth 2 (two) times daily with a meal.   metoprolol tartrate 50  MG tablet Commonly known as: LOPRESSOR TAKE ONE TABLET BY MOUTH TWICE A DAY   ondansetron 8 MG tablet Commonly known as: Zofran Take 1 tablet (8 mg total) by mouth 2 (two) times daily as needed for refractory nausea / vomiting. Start on day 3 after chemo.   pantoprazole 40 MG tablet Commonly known as: PROTONIX Take 1 tablet (40 mg total) by mouth 2 (two) times daily.   prochlorperazine 10 MG tablet Commonly known as: COMPAZINE Take 1 tablet (10 mg total) by mouth every 6 (six) hours as needed (Nausea or vomiting).   tamsulosin 0.4 MG Caps capsule Commonly known as: FLOMAX Take 0.4 mg by  mouth at bedtime.   Xarelto 10 MG Tabs tablet Generic drug: rivaroxaban TAKE ONE TABLET BY MOUTH DAILY WITH SUPPER        Allergies: No Known Allergies  Past Medical History, Surgical history, Social history, and Family History were reviewed and updated.  Review of Systems: All other 10 point review of systems is negative.   Physical Exam:  vitals were not taken for this visit.   Wt Readings from Last 3 Encounters:  08/30/21 145 lb (65.8 kg)  08/09/21 147 lb 0.6 oz (66.7 kg)  07/19/21 145 lb 1.6 oz (65.8 kg)    Ocular: Sclerae unicteric, pupils equal, round and reactive to light Ear-nose-throat: Oropharynx clear, dentition fair Lymphatic: No cervical or supraclavicular adenopathy Lungs no rales or rhonchi, good excursion bilaterally Heart regular rate and rhythm, no murmur appreciated Abd soft, nontender, positive bowel sounds MSK no focal spinal tenderness, no joint edema Neuro: non-focal, well-oriented, appropriate affect Breasts: Deferred   Lab Results  Component Value Date   WBC 4.4 08/30/2021   HGB 9.7 (L) 08/30/2021   HCT 29.8 (L) 08/30/2021   MCV 96.1 08/30/2021   PLT 137 (L) 08/30/2021   Lab Results  Component Value Date   FERRITIN 1,130 (H) 08/09/2021   IRON 54 08/09/2021   TIBC 256 08/09/2021   UIBC 202 08/09/2021   IRONPCTSAT 21 08/09/2021   Lab Results  Component Value Date   RETICCTPCT 3.0 06/28/2021   RBC 3.10 (L) 08/30/2021   No results found for: KPAFRELGTCHN, LAMBDASER, KAPLAMBRATIO No results found for: IGGSERUM, IGA, IGMSERUM No results found for: Ronnald Ramp, A1GS, Nelida Meuse, SPEI   Chemistry      Component Value Date/Time   NA 141 08/30/2021 1130   NA 140 06/10/2020 0935   K 3.5 08/30/2021 1130   CL 109 08/30/2021 1130   CO2 21 (L) 08/30/2021 1130   BUN 13 08/30/2021 1130   BUN 17 06/10/2020 0935   CREATININE 0.90 08/30/2021 1130      Component Value Date/Time   CALCIUM 9.5 08/30/2021  1130   ALKPHOS 80 08/30/2021 1130   AST 23 08/30/2021 1130   ALT 27 08/30/2021 1130   BILITOT 0.6 08/30/2021 1130       Impression and Plan: Mr. Williamsen is a very pleasant 71 yo [name] gentleman with metastatic adenocarcinoma of the stomach.  He completed 7 cycles of chemotherapy along with immunotherapy and targeted therapy.  He then began to have some disease progression and was having a hard time tolerating treatment. He is now on Enhertu and tolerating well. We will proceed with treatment today. He continues to have the pruritis. He will discontinue the Atarax and we will increase his Gabapentin to 300 mg PO QID. Patient verbalized understanding of these changes to his medication.  Iron studies are pending. We  will replace if needed.  Follow-up in 3 weeks. Last CT scan was in November 2022. No scan orders at this time per MD.    Lottie Dawson, NP 1/9/20238:39 AM

## 2021-09-18 NOTE — Telephone Encounter (Signed)
Scheduled appt per 1/9 los - called daughter and she is aware of appt date and time

## 2021-09-18 NOTE — Patient Instructions (Signed)
[facility]  Discharge Instructions: Thank you for choosing [facility] to provide your oncology and hematology care.   If you have a lab appointment with the Cancer Center, please go directly to the Cancer Center and check in at the registration area.  Wear comfortable clothing and clothing appropriate for easy access to any Portacath or PICC line.   We strive to give you quality time with your provider. You may need to reschedule your appointment if you arrive late (15 or more minutes).  Arriving late affects you and other patients whose appointments are after yours.  Also, if you miss three or more appointments without notifying the office, you may be dismissed from the clinic at the provider's discretion.      For prescription refill requests, have your pharmacy contact our office and allow 72 hours for refills to be completed.    Today you received the following chemotherapy and/or immunotherapy agents Enhertu      To help prevent nausea and vomiting after your treatment, we encourage you to take your nausea medication as directed.  BELOW ARE SYMPTOMS THAT SHOULD BE REPORTED IMMEDIATELY: *FEVER GREATER THAN 100.4 F (38 C) OR HIGHER *CHILLS OR SWEATING *NAUSEA AND VOMITING THAT IS NOT CONTROLLED WITH YOUR NAUSEA MEDICATION *UNUSUAL SHORTNESS OF BREATH *UNUSUAL BRUISING OR BLEEDING *URINARY PROBLEMS (pain or burning when urinating, or frequent urination) *BOWEL PROBLEMS (unusual diarrhea, constipation, pain near the anus) TENDERNESS IN MOUTH AND THROAT WITH OR WITHOUT PRESENCE OF ULCERS (sore throat, sores in mouth, or a toothache) UNUSUAL RASH, SWELLING OR PAIN  UNUSUAL VAGINAL DISCHARGE OR ITCHING   Items with * indicate a potential emergency and should be followed up as soon as possible or go to the Emergency Department if any problems should occur.  Please show the CHEMOTHERAPY ALERT CARD or IMMUNOTHERAPY ALERT CARD at check-in to the  Emergency Department and triage nurse. Should you have questions after your visit or need to cancel or reschedule your appointment, please contact [facility]  [phone] and follow the prompts.  Office hours are 8:00 a.m. to 4:30 p.m. Monday - Friday. Please note that voicemails left after 4:00 p.m. may not be returned until the following business day.  We are closed weekends and major holidays. You have access to a nurse at all times for urgent questions. Please call the main number to the clinic [phone] and follow the prompts.  For any non-urgent questions, you may also contact your provider using MyChart. We now offer e-Visits for anyone 18 and older to request care online for non-urgent symptoms. For details visit [email or web address].   Also download the MyChart app! Go to the app store, search "MyChart", open the app, select [facility], and log in with your MyChart username and password.  Due to Covid, a mask is required upon entering the hospital/clinic. If you do not have a mask, one will be given to you upon arrival. For doctor visits, patients may have 1 support person aged 18 or older with them. For treatment visits, patients cannot have anyone with them due to current Covid guidelines and our immunocompromised population.  

## 2021-09-28 ENCOUNTER — Other Ambulatory Visit: Payer: Self-pay | Admitting: *Deleted

## 2021-09-28 ENCOUNTER — Encounter: Payer: Self-pay | Admitting: Hematology & Oncology

## 2021-09-28 ENCOUNTER — Inpatient Hospital Stay: Payer: Medicare Other

## 2021-09-28 ENCOUNTER — Telehealth: Payer: Self-pay | Admitting: *Deleted

## 2021-09-28 ENCOUNTER — Other Ambulatory Visit: Payer: Self-pay

## 2021-09-28 ENCOUNTER — Other Ambulatory Visit: Payer: Self-pay | Admitting: Family

## 2021-09-28 VITALS — BP 95/58 | HR 82

## 2021-09-28 DIAGNOSIS — R197 Diarrhea, unspecified: Secondary | ICD-10-CM

## 2021-09-28 DIAGNOSIS — C165 Malignant neoplasm of lesser curvature of stomach, unspecified: Secondary | ICD-10-CM

## 2021-09-28 DIAGNOSIS — L2989 Other pruritus: Secondary | ICD-10-CM

## 2021-09-28 DIAGNOSIS — E876 Hypokalemia: Secondary | ICD-10-CM

## 2021-09-28 DIAGNOSIS — C799 Secondary malignant neoplasm of unspecified site: Secondary | ICD-10-CM

## 2021-09-28 DIAGNOSIS — C169 Malignant neoplasm of stomach, unspecified: Secondary | ICD-10-CM

## 2021-09-28 DIAGNOSIS — L298 Other pruritus: Secondary | ICD-10-CM

## 2021-09-28 DIAGNOSIS — Z5112 Encounter for antineoplastic immunotherapy: Secondary | ICD-10-CM | POA: Diagnosis not present

## 2021-09-28 LAB — CBC WITH DIFFERENTIAL (CANCER CENTER ONLY)
Abs Immature Granulocytes: 0.05 10*3/uL (ref 0.00–0.07)
Basophils Absolute: 0.1 10*3/uL (ref 0.0–0.1)
Basophils Relative: 1 %
Eosinophils Absolute: 0.1 10*3/uL (ref 0.0–0.5)
Eosinophils Relative: 1 %
HCT: 27 % — ABNORMAL LOW (ref 39.0–52.0)
Hemoglobin: 9.4 g/dL — ABNORMAL LOW (ref 13.0–17.0)
Immature Granulocytes: 1 %
Lymphocytes Relative: 18 %
Lymphs Abs: 1.4 10*3/uL (ref 0.7–4.0)
MCH: 34.1 pg — ABNORMAL HIGH (ref 26.0–34.0)
MCHC: 34.8 g/dL (ref 30.0–36.0)
MCV: 97.8 fL (ref 80.0–100.0)
Monocytes Absolute: 0.5 10*3/uL (ref 0.1–1.0)
Monocytes Relative: 6 %
Neutro Abs: 5.9 10*3/uL (ref 1.7–7.7)
Neutrophils Relative %: 73 %
Platelet Count: 263 10*3/uL (ref 150–400)
RBC: 2.76 MIL/uL — ABNORMAL LOW (ref 4.22–5.81)
RDW: 15.3 % (ref 11.5–15.5)
WBC Count: 8.1 10*3/uL (ref 4.0–10.5)
nRBC: 0 % (ref 0.0–0.2)

## 2021-09-28 LAB — CMP (CANCER CENTER ONLY)
ALT: 28 U/L (ref 0–44)
AST: 27 U/L (ref 15–41)
Albumin: 3.7 g/dL (ref 3.5–5.0)
Alkaline Phosphatase: 112 U/L (ref 38–126)
Anion gap: 10 (ref 5–15)
BUN: 15 mg/dL (ref 8–23)
CO2: 21 mmol/L — ABNORMAL LOW (ref 22–32)
Calcium: 9.4 mg/dL (ref 8.9–10.3)
Chloride: 106 mmol/L (ref 98–111)
Creatinine: 1.07 mg/dL (ref 0.61–1.24)
GFR, Estimated: >60 mL/min (ref >60)
Glucose, Bld: 165 mg/dL — ABNORMAL HIGH (ref 70–99)
Potassium: 3 mmol/L — ABNORMAL LOW (ref 3.5–5.1)
Sodium: 137 mmol/L (ref 135–145)
Total Bilirubin: 0.8 mg/dL (ref 0.3–1.2)
Total Protein: 6.2 g/dL — ABNORMAL LOW (ref 6.5–8.1)

## 2021-09-28 MED ORDER — POTASSIUM CHLORIDE CRYS ER 20 MEQ PO TBCR: 40.0000 meq | EXTENDED_RELEASE_TABLET | Freq: Two times a day (BID) | ORAL | Status: DC

## 2021-09-28 MED ORDER — SODIUM CHLORIDE 0.9% FLUSH
10.0000 mL | Freq: Once | INTRAVENOUS | Status: AC
  Administered 2021-09-28: 10 mL via INTRAVENOUS

## 2021-09-28 MED ORDER — HEPARIN SOD (PORK) LOCK FLUSH 100 UNIT/ML IV SOLN
500.0000 [IU] | Freq: Once | INTRAVENOUS | Status: AC
  Administered 2021-09-28: 500 [IU] via INTRAVENOUS

## 2021-09-28 MED ORDER — POTASSIUM CHLORIDE CRYS ER 20 MEQ PO TBCR
40.0000 meq | EXTENDED_RELEASE_TABLET | Freq: Once | ORAL | Status: AC
  Administered 2021-09-28: 40 meq via ORAL
  Filled 2021-09-28: qty 2

## 2021-09-28 MED ORDER — ATROPINE SULFATE 1 MG/ML IV SOLN
0.5000 mg | Freq: Once | INTRAVENOUS | Status: AC
  Administered 2021-09-28: 0.5 mg via INTRAVENOUS
  Filled 2021-09-28: qty 1

## 2021-09-28 MED ORDER — SODIUM CHLORIDE 0.9 % IV SOLN: Freq: Once | INTRAVENOUS | Status: AC

## 2021-09-28 MED ORDER — POTASSIUM CHLORIDE CRYS ER 20 MEQ PO TBCR: 20.0000 meq | EXTENDED_RELEASE_TABLET | Freq: Two times a day (BID) | ORAL | 0 refills | Status: DC

## 2021-09-28 NOTE — Telephone Encounter (Signed)
Per staff messsage - Mckenzie - fluids and atropine

## 2021-09-28 NOTE — Patient Instructions (Signed)
Atropine injection What is this medication? ATROPINE (A troe peen) can help treat many conditions. This medicine is used to reduce saliva and fluid in the respiratory tract during surgery. It is also used to treat insecticide or mushroom poisoning. It can be used in an emergency to treat a slow heartbeat. This medicine may be used for other purposes; ask your health care provider or pharmacist if you have questions. What should I tell my care team before I take this medication? They need to know if you have any of these conditions: closed-angle glaucoma heart disease, or previous heart attack kidney disease prostate trouble stomach obstruction an unusual or allergic reaction to atropine, other medicines, foods, dyes, or preservatives pregnant or trying to get pregnant breast-feeding How should I use this medication? This medicine is for injection into a muscle, vein, or under the skin. It is usually given by a health care professional in a hospital or clinic setting. If you get this medicine at home, you will be taught how to prepare and give this medicine. Use exactly as directed. It should only be given by persons who have training in the signs and treatment of nerve agent or insecticide poisoning. It is important that you put your used needles and syringes in a special sharps container. Do not put them in a trash can. If you do not have a sharps container, call your pharmacist or healthcare provider to get one. Talk to your pediatrician regarding the use of this medicine in children. Special care may be needed. Overdosage: If you think you have taken too much of this medicine contact a poison control center or emergency room at once. NOTE: This medicine is only for you. Do not share this medicine with others. What if I miss a dose? This does not apply. What may interact with this medication? atomoxetine barbiturates, like  phenobarbital benztropine donepezil ephedra galantamine glutethimide medicines for mental problems and psychotic disturbances medicines for Parkinson's disease pralidoxime rivastigmine some medicines for congestion, cold, or allergy stimulant medicines for attention disorders, weight loss, or to stay awake tacrine tegaserod This list may not describe all possible interactions. Give your health care provider a list of all the medicines, herbs, non-prescription drugs, or dietary supplements you use. Also tell them if you smoke, drink alcohol, or use illegal drugs. Some items may interact with your medicine. What should I watch for while using this medication? Side effects may occur even though you are no longer using this medicine. Contact your doctor or health care professional if you are still experiencing side effects after several days. You may get drowsy or dizzy. Do not drive, use machinery, or do anything that needs mental alertness until you know how this medicine affects you. Do not stand or sit up quickly, especially if you are an older patient. This reduces the risk of dizzy or fainting spells. Alcohol may interfere with the effect of this medicine. Avoid alcoholic drinks. Your mouth may get dry. Chewing sugarless gum or sucking hard candy, and drinking plenty of water may help. Contact your doctor if the problem does not go away or is severe. This medicine may cause dry eyes and blurred vision. If you wear contact lenses you may feel some discomfort. Lubricating drops may help. See your eye doctor if the problem does not go away or is severe. Avoid extreme heat. This medicine can cause you to sweat less than normal. Your body temperature could increase to dangerous levels, which may lead to heat stroke.  What side effects may I notice from receiving this medication? Side effects that you should report to your doctor or health care professional as soon as possible: allergic reactions  like skin rash, itching or hives, swelling of the face, lips, or tongue anxiety, nervousness changes in vision confusion fast or slow heartbeat feeling faint or lightheaded, falls hallucinations memory loss redness, blistering, peeling or loosening of the skin, including inside the mouth slurred speech trouble passing urine or change in the amount of urine unusually weak or tired vomiting Side effects that usually do not require medical attention (report to your doctor or health care professional if they continue or are bothersome): constipation dry mouth flushing or redness of face or skin within 15 to 20 minutes after the injection nausea This list may not describe all possible side effects. Call your doctor for medical advice about side effects. You may report side effects to FDA at 1-800-FDA-1088. Where should I keep my medication? Keep out of the reach of children. If you are using this medicine at home, you will be instructed on how to store this medicine. Throw away any unused medicine after the expiration date on the label. NOTE: This sheet is a summary. It may not cover all possible information. If you have questions about this medicine, talk to your doctor, pharmacist, or health care provider.  2022 Elsevier/Gold Standard (2008-02-12 00:00:00) Potassium Chloride Extended-Release Capsules or Tablets What is this medication? POTASSIUM CHLORIDE (poe TASS i um KLOOR ide) prevents and treats low levels of potassium in your body. Potassium plays an important role in maintaining the health of your kidneys, heart, muscles, and nervous system. This medicine may be used for other purposes; ask your health care provider or pharmacist if you have questions. COMMON BRAND NAME(S): ED-K+10, K-10, K-8, Klor-Con, Micro-K, Micro-K Extencaps What should I tell my care team before I take this medication? They need to know if you have any of these conditions: Addison  disease Dehydration Diabetes, high blood sugar Difficulty swallowing Heart disease High levels of potassium in the blood Irregular heartbeat or rhythm Kidney disease Large areas of burned skin Stomach ulcers, other stomach or intestine problems An unusual or allergic reaction to potassium, other medications, foods, dyes, or preservatives Pregnant or trying to get pregnant Breast-feeding How should I use this medication? Take this medication by mouth with a glass of water. Take it as directed on the prescription label at the same time every day. Take it with food. Do not cut, crush, chew, or suck this medication. Swallow the capsules whole. You may open the capsule and put the contents in a teaspoon of soft food, such as applesauce or pudding. Do not add to hot foods. Swallow the mixture right away. Do not chew the mixture. Drink a glass of water or juice after taking the mixture. Keep taking this medication unless your care team tells you to stop. Talk to your care team about the use of this medication in children. Special care may be needed. Overdosage: If you think you have taken too much of this medicine contact a poison control center or emergency room at once. NOTE: This medicine is only for you. Do not share this medicine with others. What if I miss a dose? If you miss a dose, take it as soon as you can. If it is almost time for your next dose, take only that dose. Do not take double or extra doses. What may interact with this medication? Do not take this medication  with any of the following: Certain diuretics such as spironolactone, triamterene Certain medications for stomach problems like atropine; difenoxin and glycopyrrolate Eplerenone Sodium polystyrene sulfonate This medication may also interact with the following: Certain medications for blood pressure or heart disease like lisinopril, losartan, quinapril, valsartan Medications that lower your chance of fighting infection  such as cyclosporine, tacrolimus NSAIDs, medications for pain and inflammation, like ibuprofen or naproxen Other potassium supplements Salt substitutes This list may not describe all possible interactions. Give your health care provider a list of all the medicines, herbs, non-prescription drugs, or dietary supplements you use. Also tell them if you smoke, drink alcohol, or use illegal drugs. Some items may interact with your medicine. What should I watch for while using this medication? Visit your care team for regular check-ups. You will need lab work done regularly. You may need to be on a special diet while taking this medication. Ask your care team. What side effects may I notice from receiving this medication? Side effects that you should report to your care team as soon as possible: Allergic reactions--skin rash, itching, hives, swelling of the face, lips, tongue, or throat Bowel blockage--stomach cramping, unable to have a bowel movement or pass gas, loss of appetite, vomiting Esophageal ulcer--loss of appetite, throat pain, pain or trouble swallowing, heartburn, nausea, vomiting, dry cough High potassium level--muscle weakness, fast or irregular heartbeat Stomach bleeding--bloody or black, tar-like stools, vomiting blood or brown material that looks like coffee grounds Side effects that usually do not require medical attention (report to your care team if they continue or are bothersome): Diarrhea Gas Nausea Stomach pain Vomiting This list may not describe all possible side effects. Call your doctor for medical advice about side effects. You may report side effects to FDA at 1-800-FDA-1088. Where should I keep my medication? Keep out of the reach of children. Store at room temperature between 15 and 30 degrees C (59 and 86 degrees F ). Keep bottle closed tightly to protect this medication from light and moisture. Throw away any unused medication after the expiration date. NOTE: This  sheet is a summary. It may not cover all possible information. If you have questions about this medicine, talk to your doctor, pharmacist, or health care provider.  2022 Elsevier/Gold Standard (2021-05-16 00:00:00) Dehydration, Adult Dehydration is condition in which there is not enough water or other fluids in the body. This happens when a person loses more fluids than he or she takes in. Important body parts cannot work right without the right amount of fluids. Any loss of fluids from the body can cause dehydration. Dehydration can be mild, worse, or very bad. It should be treated right away to keep it from getting very bad. What are the causes? This condition may be caused by: Conditions that cause loss of water or other fluids, such as: Watery poop (diarrhea). Vomiting. Sweating a lot. Peeing (urinating) a lot. Not drinking enough fluids, especially when you: Are ill. Are doing things that take a lot of energy to do. Other illnesses and conditions, such as fever or infection. Certain medicines, such as medicines that take extra fluid out of the body (diuretics). Lack of safe drinking water. Not being able to get enough water and food. What increases the risk? The following factors may make you more likely to develop this condition: Having a long-term (chronic) illness that has not been treated the right way, such as: Diabetes. Heart disease. Kidney disease. Being 82 years of age or older. Having a  disability. Living in a place that is high above the ground or sea (high in altitude). The thinner, dried air causes more fluid loss. Doing exercises that put stress on your body for a long time. What are the signs or symptoms? Symptoms of dehydration depend on how bad it is. Mild or worse dehydration Thirst. Dry lips or dry mouth. Feeling dizzy or light-headed, especially when you stand up from sitting. Muscle cramps. Your body making: Dark pee (urine). Pee may be the color of  tea. Less pee than normal. Less tears than normal. Headache. Very bad dehydration Changes in skin. Skin may: Be cold to the touch (clammy). Be blotchy or pale. Not go back to normal right after you lightly pinch it and let it go. Little or no tears, pee, or sweat. Changes in vital signs, such as: Fast breathing. Low blood pressure. Weak pulse. Pulse that is more than 100 beats a minute when you are sitting still. Other changes, such as: Feeling very thirsty. Eyes that look hollow (sunken). Cold hands and feet. Being mixed up (confused). Being very tired (lethargic) or having trouble waking from sleep. Short-term weight loss. Loss of consciousness. How is this treated? Treatment for this condition depends on how bad it is. Treatment should start right away. Do not wait until your condition gets very bad. Very bad dehydration is an emergency. You will need to go to a hospital. Mild or worse dehydration can be treated at home. You may be asked to: Drink more fluids. Drink an oral rehydration solution (ORS). This drink helps get the right amounts of fluids and salts and minerals in the blood (electrolytes). Very bad dehydration can be treated: With fluids through an IV tube. By getting normal levels of salts and minerals in your blood. This is often done by giving salts and minerals through a tube. The tube is passed through your nose and into your stomach. By treating the root cause. Follow these instructions at home: Oral rehydration solution If told by your doctor, drink an ORS: Make an ORS. Use instructions on the package. Start by drinking small amounts, about  cup (120 mL) every 5-10 minutes. Slowly drink more until you have had the amount that your doctor said to have. Eating and drinking     Drink enough clear fluid to keep your pee pale yellow. If you were told to drink an ORS, finish the ORS first. Then, start slowly drinking other clear fluids. Drink fluids such  as: Water. Do not drink only water. Doing that can make the salt (sodium) level in your body get too low. Water from ice chips you suck on. Fruit juice that you have added water to (diluted). Low-calorie sports drinks. Eat foods that have the right amounts of salts and minerals, such as: Bananas. Oranges. Potatoes. Tomatoes. Spinach. Do not drink alcohol. Avoid: Drinks that have a lot of sugar. These include: High-calorie sports drinks. Fruit juice that you did not add water to. Soda. Caffeine. Foods that are greasy or have a lot of fat or sugar. General instructions Take over-the-counter and prescription medicines only as told by your doctor. Do not take salt tablets. Doing that can make the salt level in your body get too high. Return to your normal activities as told by your doctor. Ask your doctor what activities are safe for you. Keep all follow-up visits as told by your doctor. This is important. Contact a doctor if: You have pain in your belly (abdomen) and the pain: Gets  worse. Stays in one place. You have a rash. You have a stiff neck. You get angry or annoyed (irritable) more easily than normal. You are more tired or have a harder time waking than normal. You feel: Weak or dizzy. Very thirsty. Get help right away if you have: Any symptoms of very bad dehydration. Symptoms of vomiting, such as: You cannot eat or drink without vomiting. Your vomiting gets worse or does not go away. Your vomit has blood or green stuff in it. Symptoms that get worse with treatment. A fever. A very bad headache. Problems with peeing or pooping (having a bowel movement), such as: Watery poop that gets worse or does not go away. Blood in your poop (stool). This may cause poop to look black and tarry. Not peeing in 6-8 hours. Peeing only a small amount of very dark pee in 6-8 hours. Trouble breathing. These symptoms may be an emergency. Do not wait to see if the symptoms will go  away. Get medical help right away. Call your local emergency services (911 in the [place] Do not drive yourself to the hospital. Summary Dehydration is a condition in which there is not enough water or other fluids in the body. This happens when a person loses more fluids than he or she takes in. Treatment for this condition depends on how bad it is. Treatment should be started right away. Do not wait until your condition gets very bad. Drink enough clear fluid to keep your pee pale yellow. If you were told to drink an oral rehydration solution (ORS), finish the ORS first. Then, start slowly drinking other clear fluids. Take over-the-counter and prescription medicines only as told by your doctor. Get help right away if you have any symptoms of very bad dehydration. This information is not intended to replace advice given to you by your health care provider. Make sure you discuss any questions you have with your health care provider. Document Revised: 04/09/2019 Document Reviewed: 04/09/2019 Elsevier Patient Education  [employer].

## 2021-09-28 NOTE — Telephone Encounter (Signed)
Spoke with pt's daughter Jimmy Blankenship (ok per [facility]) who states pt has had diarrhea since last tx on 09/18/20. Pt has taken Lomotil and Imodium and is still having diarrhea. Pt has loss his appetite and is not eating much. Per Lattie Haw, the only other thing to help with diarrhea would be Atropine, which is given in office. Per Jimmy Blankenship- see if daughter would like to bring pt in for fluids and Atropine today.  Daughter states that she would have pt here by 1:00 PM.

## 2021-09-28 NOTE — Patient Instructions (Signed)

## 2021-10-10 ENCOUNTER — Inpatient Hospital Stay: Payer: Medicare Other

## 2021-10-10 ENCOUNTER — Encounter: Payer: Self-pay | Admitting: Family

## 2021-10-10 ENCOUNTER — Other Ambulatory Visit: Payer: Self-pay

## 2021-10-10 ENCOUNTER — Inpatient Hospital Stay: Payer: Medicare Other | Admitting: Family

## 2021-10-10 VITALS — BP 106/67 | HR 82 | Resp 18

## 2021-10-10 VITALS — BP 105/78 | HR 108 | Temp 98.5°F | Resp 18 | Wt 145.0 lb

## 2021-10-10 DIAGNOSIS — Z5112 Encounter for antineoplastic immunotherapy: Secondary | ICD-10-CM | POA: Diagnosis not present

## 2021-10-10 DIAGNOSIS — C169 Malignant neoplasm of stomach, unspecified: Secondary | ICD-10-CM

## 2021-10-10 DIAGNOSIS — C799 Secondary malignant neoplasm of unspecified site: Secondary | ICD-10-CM | POA: Diagnosis not present

## 2021-10-10 DIAGNOSIS — C165 Malignant neoplasm of lesser curvature of stomach, unspecified: Secondary | ICD-10-CM

## 2021-10-10 DIAGNOSIS — D500 Iron deficiency anemia secondary to blood loss (chronic): Secondary | ICD-10-CM | POA: Diagnosis not present

## 2021-10-10 DIAGNOSIS — M792 Neuralgia and neuritis, unspecified: Secondary | ICD-10-CM

## 2021-10-10 DIAGNOSIS — L298 Other pruritus: Secondary | ICD-10-CM

## 2021-10-10 DIAGNOSIS — L2989 Other pruritus: Secondary | ICD-10-CM

## 2021-10-10 LAB — IRON AND IRON BINDING CAPACITY (CC-WL,HP ONLY)
Iron: 61 ug/dL (ref 45–182)
Saturation Ratios: 21 % (ref 17.9–39.5)
TIBC: 293 ug/dL (ref 250–450)
UIBC: 232 ug/dL (ref 117–376)

## 2021-10-10 LAB — CBC WITH DIFFERENTIAL (CANCER CENTER ONLY)
Abs Immature Granulocytes: 0.02 10*3/uL (ref 0.00–0.07)
Basophils Absolute: 0 10*3/uL (ref 0.0–0.1)
Basophils Relative: 1 %
Eosinophils Absolute: 0.2 10*3/uL (ref 0.0–0.5)
Eosinophils Relative: 4 %
HCT: 26.6 % — ABNORMAL LOW (ref 39.0–52.0)
Hemoglobin: 8.8 g/dL — ABNORMAL LOW (ref 13.0–17.0)
Immature Granulocytes: 0 %
Lymphocytes Relative: 20 %
Lymphs Abs: 1 10*3/uL (ref 0.7–4.0)
MCH: 34.4 pg — ABNORMAL HIGH (ref 26.0–34.0)
MCHC: 33.1 g/dL (ref 30.0–36.0)
MCV: 103.9 fL — ABNORMAL HIGH (ref 80.0–100.0)
Monocytes Absolute: 0.5 10*3/uL (ref 0.1–1.0)
Monocytes Relative: 10 %
Neutro Abs: 3.1 10*3/uL (ref 1.7–7.7)
Neutrophils Relative %: 65 %
Platelet Count: 174 10*3/uL (ref 150–400)
RBC: 2.56 MIL/uL — ABNORMAL LOW (ref 4.22–5.81)
RDW: 17 % — ABNORMAL HIGH (ref 11.5–15.5)
WBC Count: 4.7 10*3/uL (ref 4.0–10.5)
nRBC: 0 % (ref 0.0–0.2)

## 2021-10-10 LAB — CMP (CANCER CENTER ONLY)
ALT: 21 U/L (ref 0–44)
AST: 29 U/L (ref 15–41)
Albumin: 3 g/dL — ABNORMAL LOW (ref 3.5–5.0)
Alkaline Phosphatase: 89 U/L (ref 38–126)
Anion gap: 10 (ref 5–15)
BUN: 12 mg/dL (ref 8–23)
CO2: 21 mmol/L — ABNORMAL LOW (ref 22–32)
Calcium: 8.7 mg/dL — ABNORMAL LOW (ref 8.9–10.3)
Chloride: 107 mmol/L (ref 98–111)
Creatinine: 0.89 mg/dL (ref 0.61–1.24)
GFR, Estimated: >60 mL/min (ref >60)
Glucose, Bld: 191 mg/dL — ABNORMAL HIGH (ref 70–99)
Potassium: 3.4 mmol/L — ABNORMAL LOW (ref 3.5–5.1)
Sodium: 138 mmol/L (ref 135–145)
Total Bilirubin: 0.6 mg/dL (ref 0.3–1.2)
Total Protein: 5.8 g/dL — ABNORMAL LOW (ref 6.5–8.1)

## 2021-10-10 LAB — LACTATE DEHYDROGENASE: LDH: 164 U/L (ref 98–192)

## 2021-10-10 LAB — FERRITIN: Ferritin: 779 ng/mL — ABNORMAL HIGH (ref 24–336)

## 2021-10-10 MED ORDER — DEXTROSE 5 % IV SOLN: Freq: Once | INTRAVENOUS | Status: AC

## 2021-10-10 MED ORDER — DIPHENHYDRAMINE HCL 25 MG PO CAPS
50.0000 mg | ORAL_CAPSULE | Freq: Once | ORAL | Status: AC
  Administered 2021-10-10: 50 mg via ORAL
  Filled 2021-10-10: qty 2

## 2021-10-10 MED ORDER — ACETAMINOPHEN 325 MG PO TABS
650.0000 mg | ORAL_TABLET | Freq: Once | ORAL | Status: AC
  Administered 2021-10-10: 650 mg via ORAL
  Filled 2021-10-10: qty 2

## 2021-10-10 MED ORDER — SODIUM CHLORIDE 0.9% FLUSH: 10.0000 mL | Freq: Once | INTRAVENOUS | Status: DC

## 2021-10-10 MED ORDER — PALONOSETRON HCL INJECTION 0.25 MG/5ML
0.2500 mg | Freq: Once | INTRAVENOUS | Status: AC
  Administered 2021-10-10: 0.25 mg via INTRAVENOUS
  Filled 2021-10-10: qty 5

## 2021-10-10 MED ORDER — FAM-TRASTUZUMAB DERUXTECAN-NXKI CHEMO 100 MG IV SOLR
6.2500 mg/kg | Freq: Once | INTRAVENOUS | Status: AC
  Administered 2021-10-10: 400 mg via INTRAVENOUS
  Filled 2021-10-10: qty 20

## 2021-10-10 MED ORDER — SODIUM CHLORIDE 0.9 % IV SOLN
10.0000 mg | Freq: Once | INTRAVENOUS | Status: AC
  Administered 2021-10-10: 10 mg via INTRAVENOUS
  Filled 2021-10-10: qty 10

## 2021-10-10 MED ORDER — HEPARIN SOD (PORK) LOCK FLUSH 100 UNIT/ML IV SOLN
500.0000 [IU] | Freq: Once | INTRAVENOUS | Status: AC
  Administered 2021-10-10: 500 [IU] via INTRAVENOUS

## 2021-10-10 MED ORDER — SODIUM CHLORIDE 0.9% FLUSH
10.0000 mL | Freq: Once | INTRAVENOUS | Status: AC
  Administered 2021-10-10: 10 mL via INTRAVENOUS

## 2021-10-10 NOTE — Patient Instructions (Signed)

## 2021-10-10 NOTE — Patient Instructions (Signed)
[facility]  Discharge Instructions: Thank you for choosing [facility] to provide your oncology and hematology care.   If you have a lab appointment with the [facility], please go directly to the [facility] and check in at the registration area.  Wear comfortable clothing and clothing appropriate for easy access to any Portacath or PICC line.   We strive to give you quality time with your provider. You may need to reschedule your appointment if you arrive late (15 or more minutes).  Arriving late affects you and other patients whose appointments are after yours.  Also, if you miss three or more appointments without notifying the office, you may be dismissed from the clinic at the providers discretion.      For prescription refill requests, have your pharmacy contact our office and allow 72 hours for refills to be completed.    Today you received the following chemotherapy and/or immunotherapy agents inhertu   To help prevent nausea and vomiting after your treatment, we encourage you to take your nausea medication as directed.  BELOW ARE SYMPTOMS THAT SHOULD BE REPORTED IMMEDIATELY: *FEVER GREATER THAN 100.4 F (38 C) OR HIGHER *CHILLS OR SWEATING *NAUSEA AND VOMITING THAT IS NOT CONTROLLED WITH YOUR NAUSEA MEDICATION *UNUSUAL SHORTNESS OF BREATH *UNUSUAL BRUISING OR BLEEDING *URINARY PROBLEMS (pain or burning when urinating, or frequent urination) *BOWEL PROBLEMS (unusual diarrhea, constipation, pain near the anus) TENDERNESS IN MOUTH AND THROAT WITH OR WITHOUT PRESENCE OF ULCERS (sore throat, sores in mouth, or a toothache) UNUSUAL RASH, SWELLING OR PAIN  UNUSUAL VAGINAL DISCHARGE OR ITCHING   Items with * indicate a potential emergency and should be followed up as soon as possible or go to the Emergency Department if any problems should occur.  Please show the CHEMOTHERAPY ALERT CARD or IMMUNOTHERAPY ALERT CARD at check-in to the  Emergency Department and triage nurse. Should you have questions after your visit or need to cancel or reschedule your appointment, please contact [facility]  [phone] and follow the prompts.  Office hours are 8:00 a.m. to 4:30 p.m. Monday - Friday. Please note that voicemails left after 4:00 p.m. may not be returned until the following business day.  We are closed weekends and major holidays. You have access to a nurse at all times for urgent questions. Please call the main number to the clinic [phone] and follow the prompts.  For any non-urgent questions, you may also contact your provider using MyChart. We now offer e-Visits for anyone 23 and older to request care online for non-urgent symptoms. For details visit [email or web address].   Also download the MyChart app! Go to the app store, search "MyChart", open the app, select [facility], and log in with your MyChart username and password.  Due to Covid, a mask is required upon entering the hospital/clinic. If you do not have a mask, one will be given to you upon arrival. For doctor visits, patients may have 1 support person aged 83 or older with them. For treatment visits, patients cannot have anyone with them due to current Covid guidelines and our immunocompromised population.

## 2021-10-10 NOTE — Progress Notes (Signed)
Hematology and Oncology Follow Up Visit  Jimmy Blankenship [id number] 1950-10-12 71 y.o. 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 He is feeling much better. His itching and diarrhea have completely resolved.  He has not noted any blood loss. No bruising or petechiae.  No fever, chills, n/v, cough, rash, dizziness, SOB, chest pain, palpitations, abdominal pain or changes in bowel or bladder habits.  No swelling or tenderness in his extremities. 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  No falls or syncope.  He has maintained a good appetite and feels he is staying well hydrated throughout the day. His weight is stable at 145 lbs (up 5 lbs from his last visit).   ECOG Performance Status: 1 - Symptomatic but completely ambulatory  Medications:  Allergies as of 10/10/2021   No Known Allergies      Medication List        Accurate as of October 10, 2021  8:33 AM. If you have any questions, ask your nurse or doctor.          atorvastatin 80 MG tablet Commonly known as: LIPITOR Take 1 tablet (80 mg total) by mouth daily.   dexamethasone 4 MG tablet Commonly known as: DECADRON Take 2 tablets (8 mg total) by mouth daily. Start the day after chemotherapy for 2 days.   diphenoxylate-atropine 2.5-0.025 MG tablet Commonly known as: LOMOTIL TAKE ONE TABLET BY MOUTH FOUR TIMES A DAY AS NEEDED FOR DIARRHEA OR LOOSE STOOLS   feeding  supplement Liqd Take 237 mLs by mouth 2 (two) times daily between meals.   gabapentin 300 MG capsule Commonly known as: NEURONTIN Take 1 capsule (300 mg total) by mouth 4 (four) times daily.   glipiZIDE 5 MG tablet Commonly known as: GLUCOTROL Take 5 mg by mouth 2 (two) times daily before a meal.   lidocaine-prilocaine cream Commonly known as: EMLA Apply to affected area once   LORazepam 0.5 MG tablet Commonly known as: Ativan Take 1 tablet (0.5 mg total) by mouth every 6 (six) hours as needed (Nausea or vomiting).   megestrol 400 MG/10ML suspension Commonly known as: MEGACE Take 10 mLs (400 mg total) by mouth 2 (two) times daily.   metFORMIN 1000 MG tablet Commonly known as: GLUCOPHAGE Take 1 tablet by mouth 2 (two) times daily with a meal.   metoprolol tartrate 50 MG tablet Commonly known as: LOPRESSOR TAKE ONE TABLET BY MOUTH TWICE A DAY   ondansetron 8 MG tablet Commonly known as: Zofran Take 1 tablet (8 mg total) by mouth 2 (two) times daily as needed for refractory nausea / vomiting. Start on day 3 after chemo.   pantoprazole 40 MG tablet Commonly known as: PROTONIX Take 1 tablet (40 mg total) by mouth 2 (two) times daily.   potassium chloride SA 20 MEQ tablet Commonly known as: KLOR-CON M Take 1 tablet (20 mEq total) by mouth 2 (two) times daily.   prochlorperazine 10 MG  tablet Commonly known as: COMPAZINE Take 1 tablet (10 mg total) by mouth every 6 (six) hours as needed (Nausea or vomiting).   tamsulosin 0.4 MG Caps capsule Commonly known as: FLOMAX Take 0.4 mg by mouth at bedtime.   Xarelto 10 MG Tabs tablet Generic drug: rivaroxaban TAKE ONE TABLET BY MOUTH DAILY WITH SUPPER        Allergies: No Known Allergies  Past Medical History, Surgical history, Social history, and Family History were reviewed and updated.  Review of Systems: All other 10 point review of systems is negative.   Physical Exam:  vitals were not taken for this visit.    Wt Readings from Last 3 Encounters:  09/18/21 140 lb (63.5 kg)  08/30/21 145 lb (65.8 kg)  08/09/21 147 lb 0.6 oz (66.7 kg)    Ocular: Sclerae unicteric, pupils equal, round and reactive to light Ear-nose-throat: Oropharynx clear, dentition fair Lymphatic: No cervical or supraclavicular adenopathy Lungs no rales or rhonchi, good excursion bilaterally Heart regular rate and rhythm, no murmur appreciated Abd soft, nontender, positive bowel sounds MSK no focal spinal tenderness, no joint edema Neuro: non-focal, well-oriented, appropriate affect Breasts: Deferred   Lab Results  Component Value Date   WBC 4.7 10/10/2021   HGB 8.8 (L) 10/10/2021   HCT 26.6 (L) 10/10/2021   MCV 103.9 (H) 10/10/2021   PLT 174 10/10/2021   Lab Results  Component Value Date   FERRITIN 1,034 (H) 09/18/2021   IRON 69 09/18/2021   TIBC 283 09/18/2021   UIBC 214 09/18/2021   IRONPCTSAT 24 09/18/2021   Lab Results  Component Value Date   RETICCTPCT 3.0 06/28/2021   RBC 2.56 (L) 10/10/2021   No results found for: KPAFRELGTCHN, LAMBDASER, KAPLAMBRATIO No results found for: IGGSERUM, IGA, IGMSERUM No results found for: Odetta Pink, SPEI   Chemistry      Component Value Date/Time   NA 137 09/28/2021 1310   NA 140 06/10/2020 0935   K 3.0 (L) 09/28/2021 1310   CL 106 09/28/2021 1310   CO2 21 (L) 09/28/2021 1310   BUN 15 09/28/2021 1310   BUN 17 06/10/2020 0935   CREATININE 1.07 09/28/2021 1310      Component Value Date/Time   CALCIUM 9.4 09/28/2021 1310   ALKPHOS 112 09/28/2021 1310   AST 27 09/28/2021 1310   ALT 28 09/28/2021 1310   BILITOT 0.8 09/28/2021 1310       Impression and Plan: Jimmy Blankenship is a very pleasant 71 yo Jimmy Blankenship gentleman with metastatic adenocarcinoma of the stomach.  He completed 7 cycles of chemotherapy along with immunotherapy and targeted therapy.  He then began to have some disease progression and was having a  hard time tolerating treatment. He continues to tolerate Enhertu nicely.  We will proceed with treatment today as planned per MD.  Iron studies are pending. We will replace if needed.  Follow-up in 3 weeks.   Lottie Dawson, NP 1/31/20238:33 AM

## 2021-10-11 ENCOUNTER — Other Ambulatory Visit: Payer: Medicare Other

## 2021-10-11 ENCOUNTER — Ambulatory Visit: Payer: Medicare Other | Admitting: Hematology & Oncology

## 2021-10-11 ENCOUNTER — Telehealth: Payer: Self-pay | Admitting: *Deleted

## 2021-10-11 NOTE — Telephone Encounter (Signed)
Per 10/10/21 los - called and gave upcoming appointments - confirmed

## 2021-10-20 ENCOUNTER — Other Ambulatory Visit: Payer: Self-pay | Admitting: *Deleted

## 2021-10-20 ENCOUNTER — Inpatient Hospital Stay: Payer: Medicare Other

## 2021-10-20 ENCOUNTER — Inpatient Hospital Stay: Payer: Medicare Other | Attending: Hematology & Oncology

## 2021-10-20 ENCOUNTER — Other Ambulatory Visit: Payer: Self-pay

## 2021-10-20 VITALS — BP 112/71 | HR 64 | Temp 98.7°F | Resp 17

## 2021-10-20 DIAGNOSIS — N1832 Chronic kidney disease, stage 3b: Secondary | ICD-10-CM | POA: Insufficient documentation

## 2021-10-20 DIAGNOSIS — E1122 Type 2 diabetes mellitus with diabetic chronic kidney disease: Secondary | ICD-10-CM | POA: Insufficient documentation

## 2021-10-20 DIAGNOSIS — C7951 Secondary malignant neoplasm of bone: Secondary | ICD-10-CM | POA: Insufficient documentation

## 2021-10-20 DIAGNOSIS — Z7901 Long term (current) use of anticoagulants: Secondary | ICD-10-CM | POA: Diagnosis not present

## 2021-10-20 DIAGNOSIS — C165 Malignant neoplasm of lesser curvature of stomach, unspecified: Secondary | ICD-10-CM

## 2021-10-20 DIAGNOSIS — C787 Secondary malignant neoplasm of liver and intrahepatic bile duct: Secondary | ICD-10-CM | POA: Insufficient documentation

## 2021-10-20 DIAGNOSIS — G629 Polyneuropathy, unspecified: Secondary | ICD-10-CM | POA: Insufficient documentation

## 2021-10-20 DIAGNOSIS — K922 Gastrointestinal hemorrhage, unspecified: Secondary | ICD-10-CM

## 2021-10-20 DIAGNOSIS — D500 Iron deficiency anemia secondary to blood loss (chronic): Secondary | ICD-10-CM | POA: Diagnosis not present

## 2021-10-20 DIAGNOSIS — Z79899 Other long term (current) drug therapy: Secondary | ICD-10-CM | POA: Insufficient documentation

## 2021-10-20 DIAGNOSIS — R197 Diarrhea, unspecified: Secondary | ICD-10-CM

## 2021-10-20 DIAGNOSIS — D649 Anemia, unspecified: Secondary | ICD-10-CM

## 2021-10-20 DIAGNOSIS — C799 Secondary malignant neoplasm of unspecified site: Secondary | ICD-10-CM

## 2021-10-20 DIAGNOSIS — C169 Malignant neoplasm of stomach, unspecified: Secondary | ICD-10-CM

## 2021-10-20 DIAGNOSIS — Z5112 Encounter for antineoplastic immunotherapy: Secondary | ICD-10-CM | POA: Insufficient documentation

## 2021-10-20 DIAGNOSIS — K921 Melena: Secondary | ICD-10-CM | POA: Diagnosis not present

## 2021-10-20 DIAGNOSIS — E785 Hyperlipidemia, unspecified: Secondary | ICD-10-CM | POA: Insufficient documentation

## 2021-10-20 DIAGNOSIS — Z794 Long term (current) use of insulin: Secondary | ICD-10-CM | POA: Insufficient documentation

## 2021-10-20 LAB — CBC WITH DIFFERENTIAL (CANCER CENTER ONLY)
Abs Immature Granulocytes: 0.03 10*3/uL (ref 0.00–0.07)
Basophils Absolute: 0.1 10*3/uL (ref 0.0–0.1)
Basophils Relative: 1 %
Eosinophils Absolute: 0.2 10*3/uL (ref 0.0–0.5)
Eosinophils Relative: 5 %
HCT: 27.9 % — ABNORMAL LOW (ref 39.0–52.0)
Hemoglobin: 9.3 g/dL — ABNORMAL LOW (ref 13.0–17.0)
Immature Granulocytes: 1 %
Lymphocytes Relative: 19 %
Lymphs Abs: 0.9 10*3/uL (ref 0.7–4.0)
MCH: 34.6 pg — ABNORMAL HIGH (ref 26.0–34.0)
MCHC: 33.3 g/dL (ref 30.0–36.0)
MCV: 103.7 fL — ABNORMAL HIGH (ref 80.0–100.0)
Monocytes Absolute: 0.3 10*3/uL (ref 0.1–1.0)
Monocytes Relative: 7 %
Neutro Abs: 3.1 10*3/uL (ref 1.7–7.7)
Neutrophils Relative %: 67 %
Platelet Count: 166 10*3/uL (ref 150–400)
RBC: 2.69 MIL/uL — ABNORMAL LOW (ref 4.22–5.81)
RDW: 14.6 % (ref 11.5–15.5)
WBC Count: 4.5 10*3/uL (ref 4.0–10.5)
nRBC: 0 % (ref 0.0–0.2)

## 2021-10-20 LAB — CMP (CANCER CENTER ONLY)
ALT: 25 U/L (ref 0–44)
AST: 23 U/L (ref 15–41)
Albumin: 3.3 g/dL — ABNORMAL LOW (ref 3.5–5.0)
Alkaline Phosphatase: 102 U/L (ref 38–126)
Anion gap: 10 (ref 5–15)
BUN: 12 mg/dL (ref 8–23)
CO2: 19 mmol/L — ABNORMAL LOW (ref 22–32)
Calcium: 8.4 mg/dL — ABNORMAL LOW (ref 8.9–10.3)
Chloride: 110 mmol/L (ref 98–111)
Creatinine: 1.02 mg/dL (ref 0.61–1.24)
GFR, Estimated: >60 mL/min (ref >60)
Glucose, Bld: 185 mg/dL — ABNORMAL HIGH (ref 70–99)
Potassium: 3.3 mmol/L — ABNORMAL LOW (ref 3.5–5.1)
Sodium: 139 mmol/L (ref 135–145)
Total Bilirubin: 0.7 mg/dL (ref 0.3–1.2)
Total Protein: 5.7 g/dL — ABNORMAL LOW (ref 6.5–8.1)

## 2021-10-20 LAB — SAMPLE TO BLOOD BANK

## 2021-10-20 MED ORDER — SODIUM CHLORIDE 0.9% FLUSH
10.0000 mL | INTRAVENOUS | Status: DC | PRN
  Administered 2021-10-20: 10 mL via INTRAVENOUS

## 2021-10-20 MED ORDER — HEPARIN SOD (PORK) LOCK FLUSH 100 UNIT/ML IV SOLN
500.0000 [IU] | Freq: Once | INTRAVENOUS | Status: AC
  Administered 2021-10-20: 500 [IU] via INTRAVENOUS

## 2021-10-20 MED ORDER — SODIUM CHLORIDE 0.9 % IV SOLN: Freq: Once | INTRAVENOUS | Status: AC

## 2021-10-20 NOTE — Patient Instructions (Signed)

## 2021-10-31 ENCOUNTER — Inpatient Hospital Stay: Payer: Medicare Other

## 2021-10-31 ENCOUNTER — Encounter: Payer: Self-pay | Admitting: Hematology & Oncology

## 2021-10-31 ENCOUNTER — Inpatient Hospital Stay (HOSPITAL_BASED_OUTPATIENT_CLINIC_OR_DEPARTMENT_OTHER): Payer: Medicare Other | Admitting: Hematology & Oncology

## 2021-10-31 ENCOUNTER — Other Ambulatory Visit: Payer: Self-pay

## 2021-10-31 VITALS — BP 99/60 | HR 83 | Temp 98.6°F | Resp 18 | Wt 143.8 lb

## 2021-10-31 VITALS — BP 101/67 | HR 67

## 2021-10-31 DIAGNOSIS — M792 Neuralgia and neuritis, unspecified: Secondary | ICD-10-CM

## 2021-10-31 DIAGNOSIS — L2989 Other pruritus: Secondary | ICD-10-CM

## 2021-10-31 DIAGNOSIS — C165 Malignant neoplasm of lesser curvature of stomach, unspecified: Secondary | ICD-10-CM

## 2021-10-31 DIAGNOSIS — Z5112 Encounter for antineoplastic immunotherapy: Secondary | ICD-10-CM | POA: Diagnosis not present

## 2021-10-31 DIAGNOSIS — E1122 Type 2 diabetes mellitus with diabetic chronic kidney disease: Secondary | ICD-10-CM | POA: Diagnosis not present

## 2021-10-31 DIAGNOSIS — C169 Malignant neoplasm of stomach, unspecified: Secondary | ICD-10-CM

## 2021-10-31 DIAGNOSIS — L298 Other pruritus: Secondary | ICD-10-CM

## 2021-10-31 DIAGNOSIS — C799 Secondary malignant neoplasm of unspecified site: Secondary | ICD-10-CM

## 2021-10-31 DIAGNOSIS — D500 Iron deficiency anemia secondary to blood loss (chronic): Secondary | ICD-10-CM

## 2021-10-31 DIAGNOSIS — N1832 Chronic kidney disease, stage 3b: Secondary | ICD-10-CM

## 2021-10-31 LAB — CBC WITH DIFFERENTIAL (CANCER CENTER ONLY)
Abs Immature Granulocytes: 0.05 10*3/uL (ref 0.00–0.07)
Basophils Absolute: 0.1 10*3/uL (ref 0.0–0.1)
Basophils Relative: 1 %
Eosinophils Absolute: 0.1 10*3/uL (ref 0.0–0.5)
Eosinophils Relative: 2 %
HCT: 27.9 % — ABNORMAL LOW (ref 39.0–52.0)
Hemoglobin: 9.4 g/dL — ABNORMAL LOW (ref 13.0–17.0)
Immature Granulocytes: 1 %
Lymphocytes Relative: 16 %
Lymphs Abs: 0.9 10*3/uL (ref 0.7–4.0)
MCH: 34.9 pg — ABNORMAL HIGH (ref 26.0–34.0)
MCHC: 33.7 g/dL (ref 30.0–36.0)
MCV: 103.7 fL — ABNORMAL HIGH (ref 80.0–100.0)
Monocytes Absolute: 0.7 10*3/uL (ref 0.1–1.0)
Monocytes Relative: 12 %
Neutro Abs: 3.8 10*3/uL (ref 1.7–7.7)
Neutrophils Relative %: 68 %
Platelet Count: 180 10*3/uL (ref 150–400)
RBC: 2.69 MIL/uL — ABNORMAL LOW (ref 4.22–5.81)
RDW: 16.2 % — ABNORMAL HIGH (ref 11.5–15.5)
WBC Count: 5.6 10*3/uL (ref 4.0–10.5)
nRBC: 0.5 % — ABNORMAL HIGH (ref 0.0–0.2)

## 2021-10-31 LAB — CMP (CANCER CENTER ONLY)
ALT: 23 U/L (ref 0–44)
AST: 21 U/L (ref 15–41)
Albumin: 3.2 g/dL — ABNORMAL LOW (ref 3.5–5.0)
Alkaline Phosphatase: 94 U/L (ref 38–126)
Anion gap: 9 (ref 5–15)
BUN: 11 mg/dL (ref 8–23)
CO2: 22 mmol/L (ref 22–32)
Calcium: 8.4 mg/dL — ABNORMAL LOW (ref 8.9–10.3)
Chloride: 107 mmol/L (ref 98–111)
Creatinine: 1.13 mg/dL (ref 0.61–1.24)
GFR, Estimated: >60 mL/min (ref >60)
Glucose, Bld: 305 mg/dL — ABNORMAL HIGH (ref 70–99)
Potassium: 3.6 mmol/L (ref 3.5–5.1)
Sodium: 138 mmol/L (ref 135–145)
Total Bilirubin: 0.6 mg/dL (ref 0.3–1.2)
Total Protein: 5.5 g/dL — ABNORMAL LOW (ref 6.5–8.1)

## 2021-10-31 LAB — IRON AND IRON BINDING CAPACITY (CC-WL,HP ONLY)
Iron: 79 ug/dL (ref 45–182)
Saturation Ratios: 27 % (ref 17.9–39.5)
TIBC: 295 ug/dL (ref 250–450)
UIBC: 216 ug/dL (ref 117–376)

## 2021-10-31 LAB — LACTATE DEHYDROGENASE: LDH: 172 U/L (ref 98–192)

## 2021-10-31 LAB — FERRITIN: Ferritin: 743 ng/mL — ABNORMAL HIGH (ref 24–336)

## 2021-10-31 MED ORDER — DIPHENHYDRAMINE HCL 25 MG PO CAPS
50.0000 mg | ORAL_CAPSULE | Freq: Once | ORAL | Status: AC
  Administered 2021-10-31: 50 mg via ORAL
  Filled 2021-10-31: qty 2

## 2021-10-31 MED ORDER — HEPARIN SOD (PORK) LOCK FLUSH 100 UNIT/ML IV SOLN
500.0000 [IU] | Freq: Once | INTRAVENOUS | Status: AC | PRN
  Administered 2021-10-31: 500 [IU]

## 2021-10-31 MED ORDER — SODIUM CHLORIDE 0.9 % IV SOLN
10.0000 mg | Freq: Once | INTRAVENOUS | Status: AC
  Administered 2021-10-31: 10 mg via INTRAVENOUS
  Filled 2021-10-31: qty 10

## 2021-10-31 MED ORDER — SODIUM CHLORIDE 0.9% FLUSH
10.0000 mL | INTRAVENOUS | Status: DC | PRN
  Administered 2021-10-31: 10 mL

## 2021-10-31 MED ORDER — DIPHENOXYLATE-ATROPINE 2.5-0.025 MG PO TABS: ORAL_TABLET | ORAL | 0 refills | Status: DC

## 2021-10-31 MED ORDER — PALONOSETRON HCL INJECTION 0.25 MG/5ML
0.2500 mg | Freq: Once | INTRAVENOUS | Status: AC
  Administered 2021-10-31: 0.25 mg via INTRAVENOUS
  Filled 2021-10-31: qty 5

## 2021-10-31 MED ORDER — FAM-TRASTUZUMAB DERUXTECAN-NXKI CHEMO 100 MG IV SOLR
6.2500 mg/kg | Freq: Once | INTRAVENOUS | Status: AC
  Administered 2021-10-31: 400 mg via INTRAVENOUS
  Filled 2021-10-31: qty 20

## 2021-10-31 MED ORDER — ACETAMINOPHEN 325 MG PO TABS
650.0000 mg | ORAL_TABLET | Freq: Once | ORAL | Status: AC
  Administered 2021-10-31: 650 mg via ORAL
  Filled 2021-10-31: qty 2

## 2021-10-31 MED ORDER — DEXTROSE 5 % IV SOLN: Freq: Once | INTRAVENOUS | Status: AC

## 2021-10-31 MED ORDER — INSULIN ASPART 100 UNIT/ML IJ SOLN
15.0000 [IU] | Freq: Once | INTRAMUSCULAR | Status: AC
  Administered 2021-10-31: 15 [IU] via SUBCUTANEOUS
  Filled 2021-10-31: qty 0.15

## 2021-10-31 MED ORDER — GABAPENTIN 300 MG PO CAPS: 300.0000 mg | ORAL_CAPSULE | Freq: Four times a day (QID) | ORAL | 1 refills | Status: DC

## 2021-10-31 NOTE — Patient Instructions (Signed)
[facility]  Discharge Instructions: Thank you for choosing [facility] to provide your oncology and hematology care.   If you have a lab appointment with the Cancer Center, please go directly to the Cancer Center and check in at the registration area.  Wear comfortable clothing and clothing appropriate for easy access to any Portacath or PICC line.   We strive to give you quality time with your provider. You may need to reschedule your appointment if you arrive late (15 or more minutes).  Arriving late affects you and other patients whose appointments are after yours.  Also, if you miss three or more appointments without notifying the office, you may be dismissed from the clinic at the provider's discretion.      For prescription refill requests, have your pharmacy contact our office and allow 72 hours for refills to be completed.    Today you received the following chemotherapy and/or immunotherapy agents Enhertu      To help prevent nausea and vomiting after your treatment, we encourage you to take your nausea medication as directed.  BELOW ARE SYMPTOMS THAT SHOULD BE REPORTED IMMEDIATELY: *FEVER GREATER THAN 100.4 F (38 C) OR HIGHER *CHILLS OR SWEATING *NAUSEA AND VOMITING THAT IS NOT CONTROLLED WITH YOUR NAUSEA MEDICATION *UNUSUAL SHORTNESS OF BREATH *UNUSUAL BRUISING OR BLEEDING *URINARY PROBLEMS (pain or burning when urinating, or frequent urination) *BOWEL PROBLEMS (unusual diarrhea, constipation, pain near the anus) TENDERNESS IN MOUTH AND THROAT WITH OR WITHOUT PRESENCE OF ULCERS (sore throat, sores in mouth, or a toothache) UNUSUAL RASH, SWELLING OR PAIN  UNUSUAL VAGINAL DISCHARGE OR ITCHING   Items with * indicate a potential emergency and should be followed up as soon as possible or go to the Emergency Department if any problems should occur.  Please show the CHEMOTHERAPY ALERT CARD or IMMUNOTHERAPY ALERT CARD at check-in to the  Emergency Department and triage nurse. Should you have questions after your visit or need to cancel or reschedule your appointment, please contact [facility]  [phone] and follow the prompts.  Office hours are 8:00 a.m. to 4:30 p.m. Monday - Friday. Please note that voicemails left after 4:00 p.m. may not be returned until the following business day.  We are closed weekends and major holidays. You have access to a nurse at all times for urgent questions. Please call the main number to the clinic [phone] and follow the prompts.  For any non-urgent questions, you may also contact your provider using MyChart. We now offer e-Visits for anyone 18 and older to request care online for non-urgent symptoms. For details visit [email or web address].   Also download the MyChart app! Go to the app store, search "MyChart", open the app, select [facility], and log in with your MyChart username and password.  Due to Covid, a mask is required upon entering the hospital/clinic. If you do not have a mask, one will be given to you upon arrival. For doctor visits, patients may have 1 support person aged 18 or older with them. For treatment visits, patients cannot have anyone with them due to current Covid guidelines and our immunocompromised population.  

## 2021-10-31 NOTE — Telephone Encounter (Signed)
Pt is in office today and is requesting a refill on Lomotil and Gabapentin. Per pt Jimmy Blankenship increased his Gabapentin 300 mg from 1 tablet three times a day to 1 tablet four times a day. Pt was not able to get this prescription from the mail order pharmacy and would like this sent to the [facility] on file. Please advise, thanks.

## 2021-10-31 NOTE — Progress Notes (Signed)
Hematology and Oncology Follow Up Visit  Zyren Sevigny [id number] 1951-07-10 71 y.o. 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 Desha is here today for follow-up and treatment.  He feels pretty well.  He has not had a little bit of diarrhea.  Has been little bit of bleeding with the diarrhea.  I know he is on the Xarelto.  This certainly could be part of the reason.  He has had no problems with cough or shortness of breath.  He does have the ventricular thrombus.  Again he is on Xarelto to help prevent clotting from the thrombus.  He has had no issues with his appetite.  His weight is holding steady.  There is no leg swelling.  He has had no leg pain.  There is no issues with neuropathy in his feet.  He has had no urinary issues.  Overall, I would say his performance status is probably ECOG 1.    Medications:  Allergies as of 10/31/2021   No Known Allergies      Medication List        Accurate as of October 31, 2021 10:43 AM. If you have any questions, ask your nurse or doctor.          atorvastatin 80 MG tablet Commonly known as: LIPITOR Take 1 tablet (80 mg total) by mouth daily.   dexamethasone 4 MG tablet Commonly known as: DECADRON Take 2 tablets (8 mg total) by mouth daily. Start the day after chemotherapy for 2 days.   diphenoxylate-atropine 2.5-0.025 MG tablet Commonly known as: LOMOTIL TAKE ONE TABLET BY MOUTH FOUR TIMES A DAY AS NEEDED FOR DIARRHEA OR LOOSE STOOLS   feeding  supplement Liqd Take 237 mLs by mouth 2 (two) times daily between meals.   gabapentin 300 MG capsule Commonly known as: NEURONTIN Take 1 capsule (300 mg total) by mouth 4 (four) times daily.   glipiZIDE 5 MG tablet Commonly known as: GLUCOTROL Take 5 mg by mouth 2 (two) times daily before a meal.   lidocaine-prilocaine cream Commonly known as: EMLA Apply to affected area once   LORazepam 0.5 MG tablet Commonly known as: Ativan Take 1 tablet (0.5 mg total) by mouth every 6 (six) hours as needed (Nausea or vomiting).   megestrol 400 MG/10ML suspension Commonly known as: MEGACE Take 10 mLs (400 mg total) by mouth 2 (two) times daily.   melatonin 5 MG Tabs Take 5 mg by mouth at bedtime as needed.   metFORMIN 1000 MG tablet Commonly known as: GLUCOPHAGE Take 1 tablet by mouth 2 (two) times daily with a meal.   metoprolol tartrate 50 MG tablet Commonly known as: LOPRESSOR TAKE ONE TABLET BY MOUTH TWICE A DAY   ondansetron 8 MG tablet Commonly known as: Zofran Take 1 tablet (8 mg total) by mouth 2 (two) times daily as needed for refractory nausea / vomiting. Start on day 3 after chemo.   pantoprazole 40 MG tablet Commonly known as: PROTONIX Take 1 tablet (40 mg total)  by mouth 2 (two) times daily.   potassium chloride SA 20 MEQ tablet Commonly known as: KLOR-CON M Take 1 tablet (20 mEq total) by mouth 2 (two) times daily.   prochlorperazine 10 MG tablet Commonly known as: COMPAZINE Take 1 tablet (10 mg total) by mouth every 6 (six) hours as needed (Nausea or vomiting).   tamsulosin 0.4 MG Caps capsule Commonly known as: FLOMAX Take 0.4 mg by mouth at bedtime.   Xarelto 10 MG Tabs tablet Generic drug: rivaroxaban TAKE ONE TABLET BY MOUTH DAILY WITH SUPPER        Allergies: No Known Allergies  Past Medical History, Surgical history, Social history, and Family History were reviewed and updated.  Review of Systems: Review of Systems  Constitutional: Negative.    HENT: Negative.    Eyes: Negative.   Respiratory: Negative.    Cardiovascular: Negative.   Gastrointestinal:  Positive for blood in stool and diarrhea.  Genitourinary: Negative.   Musculoskeletal: Negative.   Skin: Negative.   Neurological: Negative.   Endo/Heme/Allergies: Negative.   Psychiatric/Behavioral: Negative.      Physical Exam:  weight is 143 lb 12.8 oz (65.2 kg). His oral temperature is 98.6 F (37 C). His blood pressure is 99/60 and his pulse is 83. His respiration is 18 and oxygen saturation is 99%.   Wt Readings from Last 3 Encounters:  10/31/21 143 lb 12.8 oz (65.2 kg)  10/10/21 145 lb 0.6 oz (65.8 kg)  09/18/21 140 lb (63.5 kg)    Physical Exam Vitals reviewed.  HENT:     Head: Normocephalic and atraumatic.  Eyes:     Pupils: Pupils are equal, round, and reactive to light.  Cardiovascular:     Rate and Rhythm: Normal rate and regular rhythm.     Heart sounds: Normal heart sounds.  Pulmonary:     Effort: Pulmonary effort is normal.     Breath sounds: Normal breath sounds.  Abdominal:     General: Bowel sounds are normal.     Palpations: Abdomen is soft.  Musculoskeletal:        General: No tenderness or deformity. Normal range of motion.     Cervical back: Normal range of motion.  Lymphadenopathy:     Cervical: No cervical adenopathy.  Skin:    General: Skin is warm and dry.     Findings: No erythema or rash.  Neurological:     Mental Status: He is alert and oriented to person, place, and time.  Psychiatric:        Behavior: Behavior normal.        Thought Content: Thought content normal.        Judgment: Judgment normal.     Lab Results  Component Value Date   WBC 5.6 10/31/2021   HGB 9.4 (L) 10/31/2021   HCT 27.9 (L) 10/31/2021   MCV 103.7 (H) 10/31/2021   PLT 180 10/31/2021   Lab Results  Component Value Date   FERRITIN 779 (H) 10/10/2021   IRON 61 10/10/2021   TIBC 293 10/10/2021   UIBC 232 10/10/2021   IRONPCTSAT 21 10/10/2021    Lab Results  Component Value Date   RETICCTPCT 3.0 06/28/2021   RBC 2.69 (L) 10/31/2021   No results found for: KPAFRELGTCHN, LAMBDASER, KAPLAMBRATIO No results found for: IGGSERUM, IGA, IGMSERUM No results found for: Ronnald Ramp, A1GS, A2GS, BETS, BETA2SER, GAMS, MSPIKE, SPEI   Chemistry      Component Value Date/Time   NA 138 10/31/2021 0852  NA 140 06/10/2020 0935   K 3.6 10/31/2021 0852   CL 107 10/31/2021 0852   CO2 22 10/31/2021 0852   BUN 11 10/31/2021 0852   BUN 17 06/10/2020 0935   CREATININE 1.13 10/31/2021 0852      Component Value Date/Time   CALCIUM 8.4 (L) 10/31/2021 0852   ALKPHOS 94 10/31/2021 0852   AST 21 10/31/2021 0852   ALT 23 10/31/2021 0852   BILITOT 0.6 10/31/2021 0852       Impression and Plan: Mr. Neyland is a very pleasant 71 yo [name] gentleman with metastatic adenocarcinoma of the stomach.   He completed 7 cycles of chemotherapy along with immunotherapy and targeted therapy.  He then began to have some disease progression and was having a hard time tolerating treatment.  I think is doing well on the Enhertu.  This will be his 11th cycle of Enhertu.  We probably will have to get scans on him after this cycle so we see how things are going for him.  His blood sugars are on the high side.  I will give him some insulin today.  We will plan to get him back in 3 weeks for follow-up.    Volanda Napoleon, MD 2/21/202310:43 AM

## 2021-11-17 ENCOUNTER — Encounter (HOSPITAL_BASED_OUTPATIENT_CLINIC_OR_DEPARTMENT_OTHER): Payer: Self-pay

## 2021-11-17 ENCOUNTER — Other Ambulatory Visit: Payer: Self-pay

## 2021-11-17 ENCOUNTER — Ambulatory Visit (HOSPITAL_BASED_OUTPATIENT_CLINIC_OR_DEPARTMENT_OTHER)
Admission: RE | Admit: 2021-11-17 | Discharge: 2021-11-17 | Disposition: A | Discharge status: Home / Self Care | Payer: Medicare Other | Source: Ambulatory Visit | Attending: Hematology & Oncology | Admitting: Hematology & Oncology

## 2021-11-17 DIAGNOSIS — C799 Secondary malignant neoplasm of unspecified site: Secondary | ICD-10-CM | POA: Insufficient documentation

## 2021-11-17 DIAGNOSIS — C169 Malignant neoplasm of stomach, unspecified: Secondary | ICD-10-CM

## 2021-11-17 IMAGING — CT CT CHEST-ABD-PELV W/ CM
2 of 5 series · 13 of 36 positions shown, 15 images · IV contrast (Omnipaque)
Comparison: [DATE]

CLINICAL DATA: Follow-up metastatic gastric carcinoma.

EXAM:
CT CHEST, ABDOMEN, AND PELVIS WITH CONTRAST
TECHNIQUE: Multidetector CT imaging of the chest, abdomen and pelvis was
performed following the standard protocol during bolus
administration of intravenous contrast.

[Series 2: cap with 2 · axial · 0.77mm/px · z∈[+802,+1367]mm · 10 of 139 slices shown, 12 images]
[im 13/139  mediastinal]
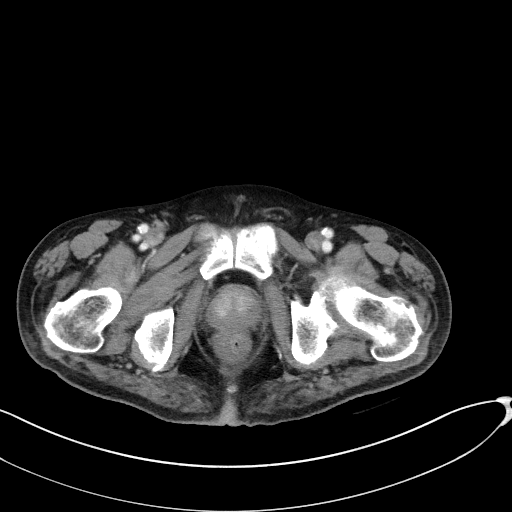
[im 13/139  bone]
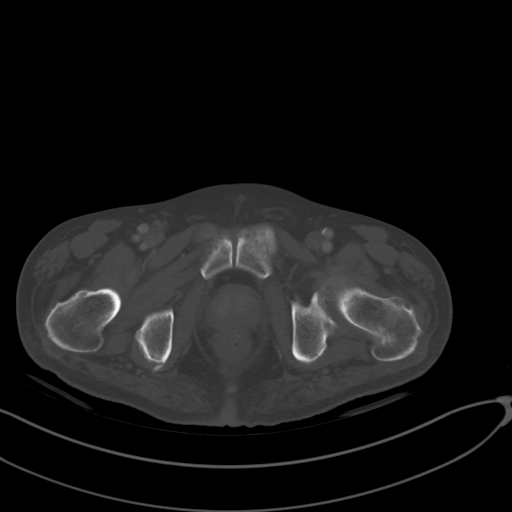
[im 26/139  mediastinal]
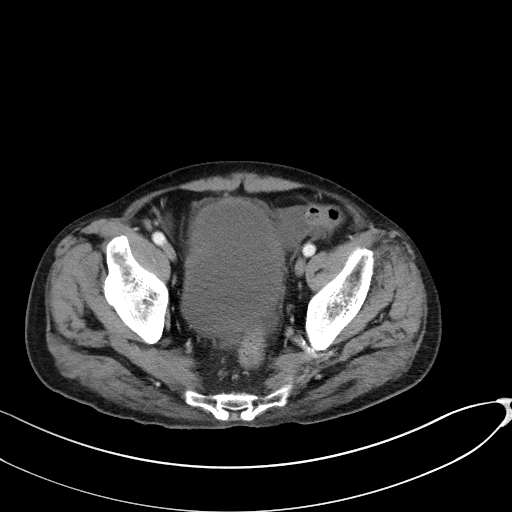
[im 38/139  mediastinal]
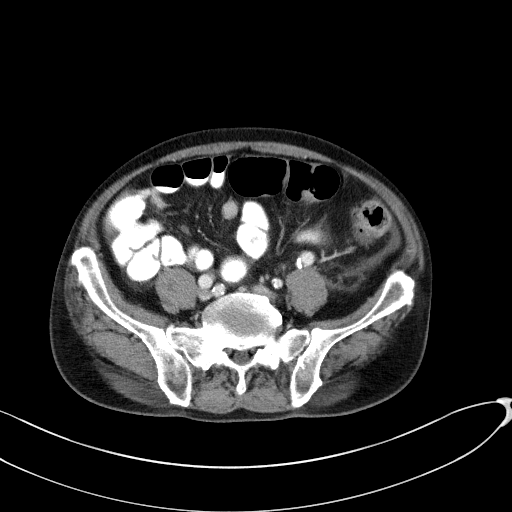
[im 51/139  mediastinal]
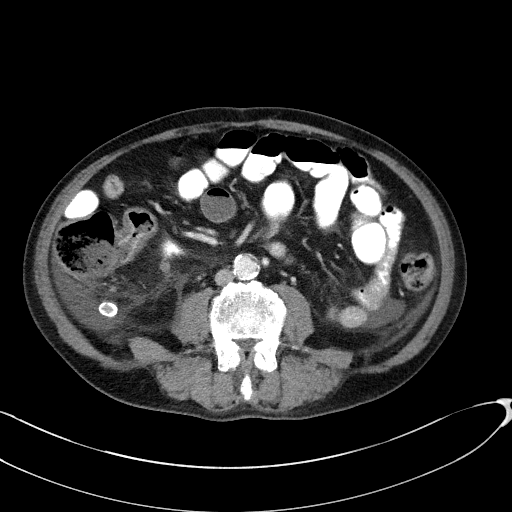
[im 63/139  mediastinal]
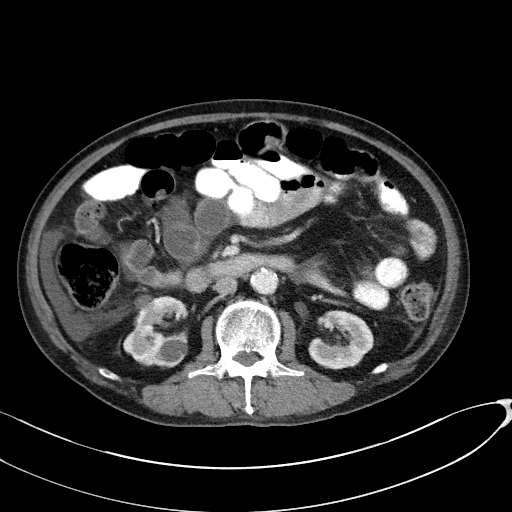
[im 76/139  mediastinal]
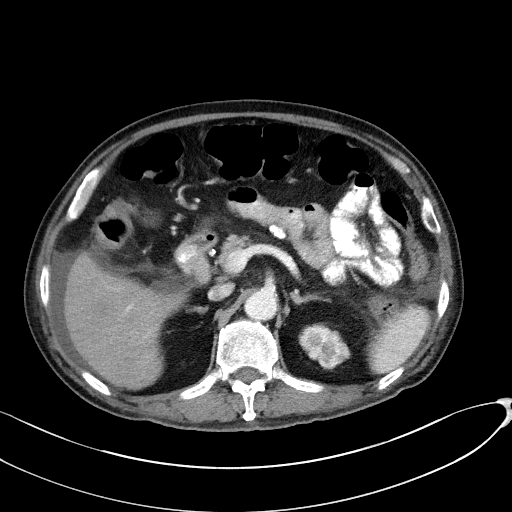
[im 88/139  mediastinal]
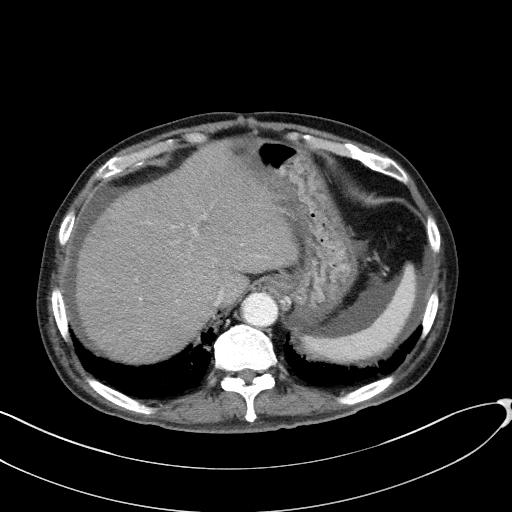
[im 101/139  mediastinal]
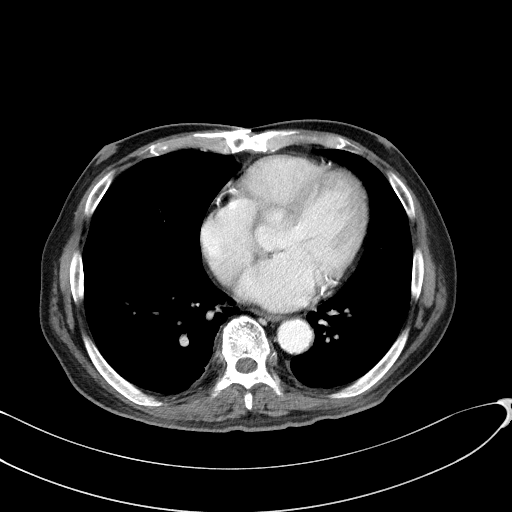
[im 113/139  mediastinal]
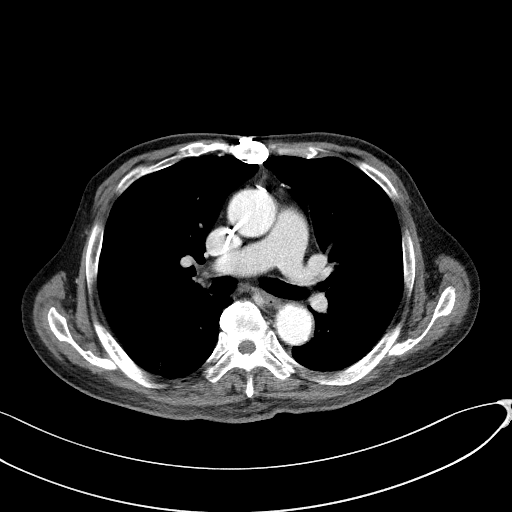
[im 113/139  bone]
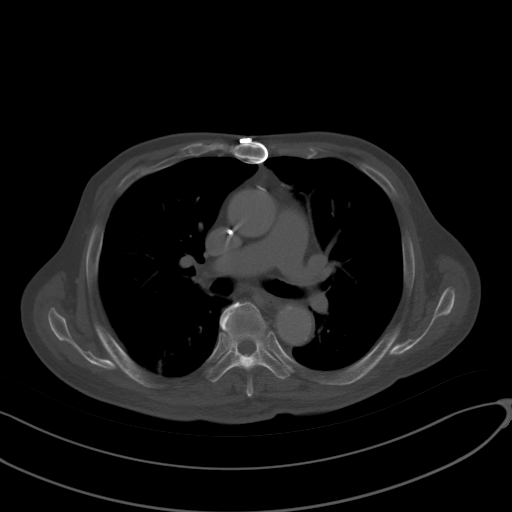
[im 126/139  mediastinal]
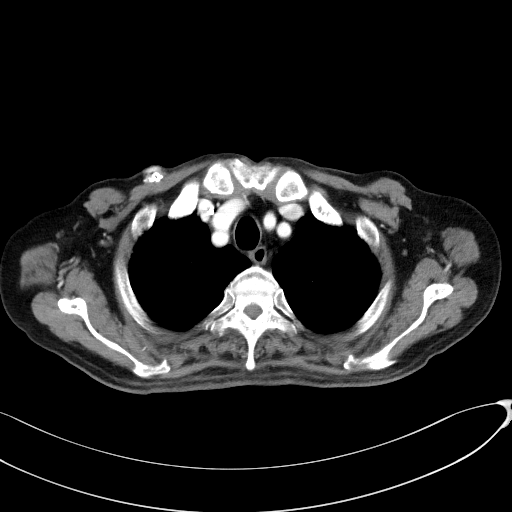

[Series 4: coronals · coronal · 0.83mm/px · 3 of 131 slices shown]
[im 27/131  mediastinal]
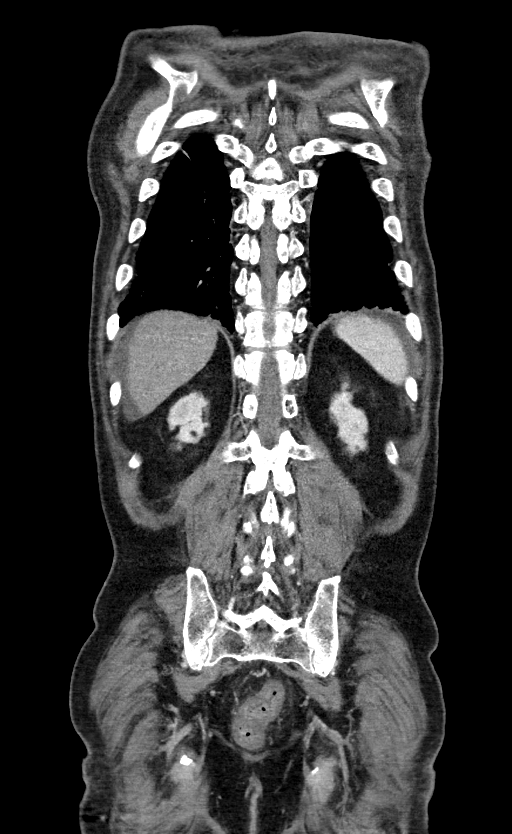
[im 53/131  mediastinal]
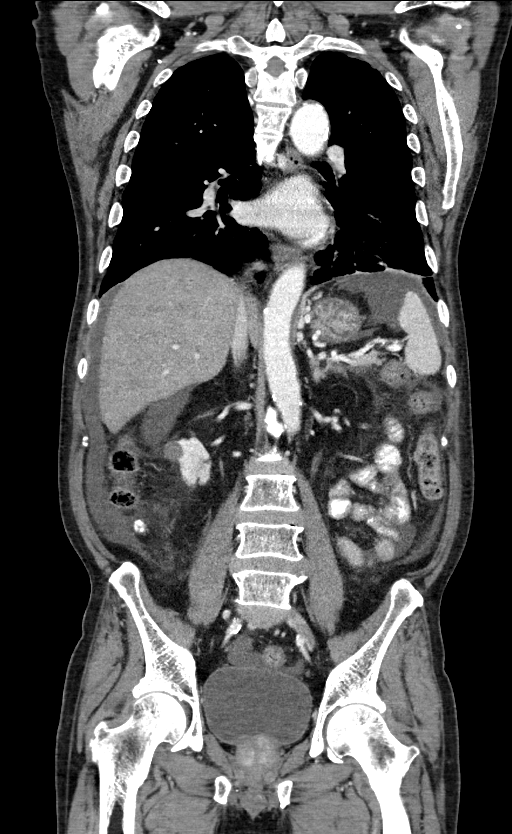
[im 79/131  mediastinal]
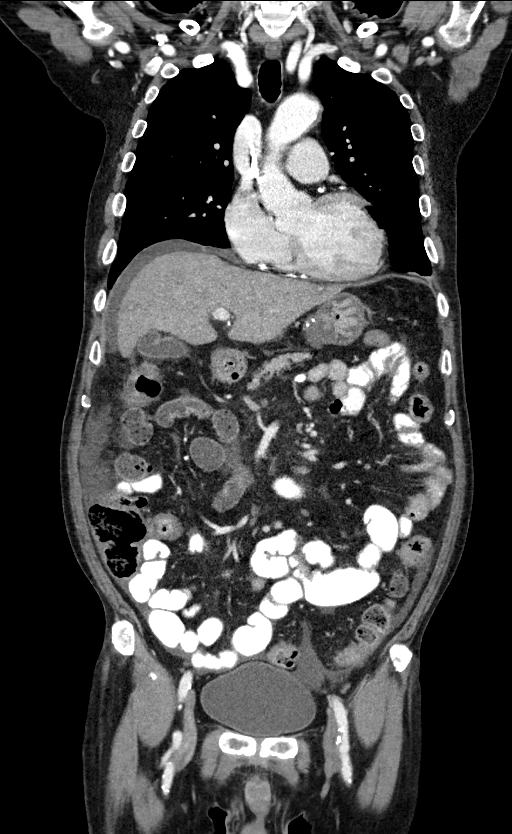

[13 of 36 positions shown; findings below may reference images not displayed]

RADIATION DOSE REDUCTION: This exam was performed according to the
departmental dose-optimization program which includes automated
exposure control, adjustment of the mA and/or kV according to
patient size and/or use of iterative reconstruction technique.

CONTRAST:  100mL OMNIPAQUE IOHEXOL 300 MG/ML  SOLN
FINDINGS: CT CHEST FINDINGS

Cardiovascular: No acute findings. Prior CABG. Stable mild
cardiomegaly. Aortic and coronary atherosclerotic calcification
noted.

Mediastinum/Lymph Nodes: No masses or pathologically enlarged lymph
nodes identified.

Lungs/Pleura: Mild paraseptal emphysema again seen. Scarring is
noted in both lower lobes. New patchy airspace opacity is seen in
the superior segment of the right lower lobe with associated air
bronchograms, consistent with inflammatory or infectious etiology.
No suspicious pulmonary nodules or masses identified. No evidence of
pleural effusion.

Musculoskeletal:  No suspicious bone lesions identified.

CT ABDOMEN AND PELVIS FINDINGS

Hepatobiliary: Several small low-attenuation masses in the right and
left lobes are less conspicuous than on previous study, consistent
with improving liver metastases. These are difficult to measure due
to their indistinct appearance and poorly defined margins on today's
exam. No new or enlarging liver lesions identified. Gallbladder is
unremarkable. No evidence of biliary ductal dilatation.

Pancreas:  No mass or inflammatory changes.

Spleen:  Within normal limits in size and appearance.

Adrenals/Urinary tract: Stable bilateral renal parenchymal scarring.
Stable small right renal cysts. No masses or hydronephrosis.

Stomach/Bowel: Decreased bulkiness of low-attenuation mass involving
the posterior wall of the gastric body is seen compared to previous
study. This currently measures 7.0 x 3.3 cm on image 57/2, compared
to 7.4 x 5.2 cm on prior exam. Small and large bowel are
unremarkable in appearance.

Vascular/Lymphatic: No pathologically enlarged lymph nodes
identified. No acute vascular findings. Aortic atherosclerotic
calcification noted.

Reproductive:  No mass or other significant abnormality identified.

Other: Mild ascites in abdomen and pelvis is new since previous
study. No evidence of peritoneal nodules or masses.

Musculoskeletal:  No suspicious bone lesions identified.
IMPRESSION: Decreased size of low-attenuation mass in the gastric body.

Interval improvement in small liver metastases since prior study.

New mild ascites. No other new or progressive disease in the abdomen
or pelvis.

New patchy airspace opacity in superior segment of right lower lobe,
consistent with inflammatory or infectious etiology.

Aortic Atherosclerosis ([P2]-[P2]) and Emphysema ([P2]-[P2]).

## 2021-11-17 MED ORDER — IOHEXOL 300 MG/ML  SOLN
100.0000 mL | Freq: Once | INTRAMUSCULAR | Status: AC | PRN
  Administered 2021-11-17: 100 mL via INTRAVENOUS

## 2021-11-23 ENCOUNTER — Other Ambulatory Visit: Payer: Self-pay

## 2021-11-23 ENCOUNTER — Inpatient Hospital Stay: Payer: Medicare Other | Attending: Hematology & Oncology

## 2021-11-23 ENCOUNTER — Inpatient Hospital Stay: Payer: Medicare Other

## 2021-11-23 ENCOUNTER — Encounter: Payer: Self-pay | Admitting: Hematology & Oncology

## 2021-11-23 ENCOUNTER — Inpatient Hospital Stay: Payer: Medicare Other | Admitting: Hematology & Oncology

## 2021-11-23 VITALS — BP 118/65 | HR 91 | Temp 98.6°F | Resp 17 | Wt 147.0 lb

## 2021-11-23 DIAGNOSIS — C799 Secondary malignant neoplasm of unspecified site: Secondary | ICD-10-CM

## 2021-11-23 DIAGNOSIS — R197 Diarrhea, unspecified: Secondary | ICD-10-CM | POA: Insufficient documentation

## 2021-11-23 DIAGNOSIS — Z79899 Other long term (current) drug therapy: Secondary | ICD-10-CM | POA: Insufficient documentation

## 2021-11-23 DIAGNOSIS — D500 Iron deficiency anemia secondary to blood loss (chronic): Secondary | ICD-10-CM | POA: Diagnosis not present

## 2021-11-23 DIAGNOSIS — C165 Malignant neoplasm of lesser curvature of stomach, unspecified: Secondary | ICD-10-CM | POA: Insufficient documentation

## 2021-11-23 DIAGNOSIS — C787 Secondary malignant neoplasm of liver and intrahepatic bile duct: Secondary | ICD-10-CM | POA: Insufficient documentation

## 2021-11-23 DIAGNOSIS — Z7901 Long term (current) use of anticoagulants: Secondary | ICD-10-CM | POA: Diagnosis not present

## 2021-11-23 DIAGNOSIS — K921 Melena: Secondary | ICD-10-CM | POA: Diagnosis not present

## 2021-11-23 DIAGNOSIS — K922 Gastrointestinal hemorrhage, unspecified: Secondary | ICD-10-CM | POA: Insufficient documentation

## 2021-11-23 DIAGNOSIS — C7951 Secondary malignant neoplasm of bone: Secondary | ICD-10-CM | POA: Diagnosis not present

## 2021-11-23 DIAGNOSIS — C169 Malignant neoplasm of stomach, unspecified: Secondary | ICD-10-CM

## 2021-11-23 DIAGNOSIS — Z5112 Encounter for antineoplastic immunotherapy: Secondary | ICD-10-CM | POA: Insufficient documentation

## 2021-11-23 LAB — CMP (CANCER CENTER ONLY)
ALT: 30 U/L (ref 0–44)
AST: 28 U/L (ref 15–41)
Albumin: 3.4 g/dL — ABNORMAL LOW (ref 3.5–5.0)
Alkaline Phosphatase: 138 U/L — ABNORMAL HIGH (ref 38–126)
Anion gap: 10 (ref 5–15)
BUN: 9 mg/dL (ref 8–23)
CO2: 22 mmol/L (ref 22–32)
Calcium: 9 mg/dL (ref 8.9–10.3)
Chloride: 108 mmol/L (ref 98–111)
Creatinine: 0.82 mg/dL (ref 0.61–1.24)
GFR, Estimated: >60 mL/min (ref >60)
Glucose, Bld: 127 mg/dL — ABNORMAL HIGH (ref 70–99)
Potassium: 3.1 mmol/L — ABNORMAL LOW (ref 3.5–5.1)
Sodium: 140 mmol/L (ref 135–145)
Total Bilirubin: 0.6 mg/dL (ref 0.3–1.2)
Total Protein: 5.8 g/dL — ABNORMAL LOW (ref 6.5–8.1)

## 2021-11-23 LAB — CBC WITH DIFFERENTIAL (CANCER CENTER ONLY)
Abs Immature Granulocytes: 0.04 10*3/uL (ref 0.00–0.07)
Basophils Absolute: 0 10*3/uL (ref 0.0–0.1)
Basophils Relative: 1 %
Eosinophils Absolute: 0.1 10*3/uL (ref 0.0–0.5)
Eosinophils Relative: 2 %
HCT: 27 % — ABNORMAL LOW (ref 39.0–52.0)
Hemoglobin: 9 g/dL — ABNORMAL LOW (ref 13.0–17.0)
Immature Granulocytes: 1 %
Lymphocytes Relative: 18 %
Lymphs Abs: 1 10*3/uL (ref 0.7–4.0)
MCH: 34.9 pg — ABNORMAL HIGH (ref 26.0–34.0)
MCHC: 33.3 g/dL (ref 30.0–36.0)
MCV: 104.7 fL — ABNORMAL HIGH (ref 80.0–100.0)
Monocytes Absolute: 0.7 10*3/uL (ref 0.1–1.0)
Monocytes Relative: 12 %
Neutro Abs: 3.6 10*3/uL (ref 1.7–7.7)
Neutrophils Relative %: 66 %
Platelet Count: 167 10*3/uL (ref 150–400)
RBC: 2.58 MIL/uL — ABNORMAL LOW (ref 4.22–5.81)
RDW: 16.4 % — ABNORMAL HIGH (ref 11.5–15.5)
WBC Count: 5.4 10*3/uL (ref 4.0–10.5)
nRBC: 0 % (ref 0.0–0.2)

## 2021-11-23 LAB — IRON AND IRON BINDING CAPACITY (CC-WL,HP ONLY)
Iron: 66 ug/dL (ref 45–182)
Saturation Ratios: 23 % (ref 17.9–39.5)
TIBC: 291 ug/dL (ref 250–450)
UIBC: 225 ug/dL (ref 117–376)

## 2021-11-23 LAB — FERRITIN: Ferritin: 877 ng/mL — ABNORMAL HIGH (ref 24–336)

## 2021-11-23 LAB — LACTATE DEHYDROGENASE: LDH: 191 U/L (ref 98–192)

## 2021-11-23 MED ORDER — SODIUM CHLORIDE 0.9% FLUSH
10.0000 mL | INTRAVENOUS | Status: DC | PRN
  Administered 2021-11-23: 10 mL

## 2021-11-23 MED ORDER — SODIUM CHLORIDE 0.9 % IV SOLN
10.0000 mg | Freq: Once | INTRAVENOUS | Status: AC
  Administered 2021-11-23: 10 mg via INTRAVENOUS
  Filled 2021-11-23: qty 10

## 2021-11-23 MED ORDER — DIPHENHYDRAMINE HCL 25 MG PO CAPS
50.0000 mg | ORAL_CAPSULE | Freq: Once | ORAL | Status: AC
  Administered 2021-11-23: 50 mg via ORAL
  Filled 2021-11-23: qty 2

## 2021-11-23 MED ORDER — DEXTROSE 5 % IV SOLN: Freq: Once | INTRAVENOUS | Status: AC

## 2021-11-23 MED ORDER — FAM-TRASTUZUMAB DERUXTECAN-NXKI CHEMO 100 MG IV SOLR
6.2500 mg/kg | Freq: Once | INTRAVENOUS | Status: AC
  Administered 2021-11-23: 400 mg via INTRAVENOUS
  Filled 2021-11-23: qty 20

## 2021-11-23 MED ORDER — PALONOSETRON HCL INJECTION 0.25 MG/5ML
0.2500 mg | Freq: Once | INTRAVENOUS | Status: AC
  Administered 2021-11-23: 0.25 mg via INTRAVENOUS
  Filled 2021-11-23: qty 5

## 2021-11-23 MED ORDER — HEPARIN SOD (PORK) LOCK FLUSH 100 UNIT/ML IV SOLN
500.0000 [IU] | Freq: Once | INTRAVENOUS | Status: AC | PRN
  Administered 2021-11-23: 500 [IU]

## 2021-11-23 MED ORDER — ACETAMINOPHEN 325 MG PO TABS
650.0000 mg | ORAL_TABLET | Freq: Once | ORAL | Status: AC
  Administered 2021-11-23: 650 mg via ORAL
  Filled 2021-11-23: qty 2

## 2021-11-23 NOTE — Patient Instructions (Signed)
[facility]  Discharge Instructions: Thank you for choosing [facility] to provide your oncology and hematology care.   If you have a lab appointment with the Cancer Center, please go directly to the Cancer Center and check in at the registration area.  Wear comfortable clothing and clothing appropriate for easy access to any Portacath or PICC line.   We strive to give you quality time with your provider. You may need to reschedule your appointment if you arrive late (15 or more minutes).  Arriving late affects you and other patients whose appointments are after yours.  Also, if you miss three or more appointments without notifying the office, you may be dismissed from the clinic at the provider's discretion.      For prescription refill requests, have your pharmacy contact our office and allow 72 hours for refills to be completed.    Today you received the following chemotherapy and/or immunotherapy agents Enhertu      To help prevent nausea and vomiting after your treatment, we encourage you to take your nausea medication as directed.  BELOW ARE SYMPTOMS THAT SHOULD BE REPORTED IMMEDIATELY: *FEVER GREATER THAN 100.4 F (38 C) OR HIGHER *CHILLS OR SWEATING *NAUSEA AND VOMITING THAT IS NOT CONTROLLED WITH YOUR NAUSEA MEDICATION *UNUSUAL SHORTNESS OF BREATH *UNUSUAL BRUISING OR BLEEDING *URINARY PROBLEMS (pain or burning when urinating, or frequent urination) *BOWEL PROBLEMS (unusual diarrhea, constipation, pain near the anus) TENDERNESS IN MOUTH AND THROAT WITH OR WITHOUT PRESENCE OF ULCERS (sore throat, sores in mouth, or a toothache) UNUSUAL RASH, SWELLING OR PAIN  UNUSUAL VAGINAL DISCHARGE OR ITCHING   Items with * indicate a potential emergency and should be followed up as soon as possible or go to the Emergency Department if any problems should occur.  Please show the CHEMOTHERAPY ALERT CARD or IMMUNOTHERAPY ALERT CARD at check-in to the  Emergency Department and triage nurse. Should you have questions after your visit or need to cancel or reschedule your appointment, please contact [facility]  [phone] and follow the prompts.  Office hours are 8:00 a.m. to 4:30 p.m. Monday - Friday. Please note that voicemails left after 4:00 p.m. may not be returned until the following business day.  We are closed weekends and major holidays. You have access to a nurse at all times for urgent questions. Please call the main number to the clinic [phone] and follow the prompts.  For any non-urgent questions, you may also contact your provider using MyChart. We now offer e-Visits for anyone 18 and older to request care online for non-urgent symptoms. For details visit [email or web address].   Also download the MyChart app! Go to the app store, search "MyChart", open the app, select [facility], and log in with your MyChart username and password.  Due to Covid, a mask is required upon entering the hospital/clinic. If you do not have a mask, one will be given to you upon arrival. For doctor visits, patients may have 1 support person aged 18 or older with them. For treatment visits, patients cannot have anyone with them due to current Covid guidelines and our immunocompromised population.  

## 2021-11-23 NOTE — Progress Notes (Signed)
Patinet accompanied by interpreter - Sunmi. ?

## 2021-11-23 NOTE — Progress Notes (Signed)
?Hematology and Oncology Follow Up Visit ? ?Jimmy Blankenship ?[id number] ?09/04/1951 71 y.o. ?11/23/2021 ? ? 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  ?Current Therapy:        ?FOLFOX/Nivolumab/Herceptin -- s/p cycle #8 --started on 09/19/2020 -- d/c on 01/26/2021 ?Enhertu -- IV q 3 weeks -- started 02/01/2021, s/p cycle #9 ?IV iron as indicated-last dose given on 10/05/2020 ?Xarelto 10 mg po q day -- start on 03/02/2021 and dose reduced to maintenance on 03/29/2021 ?  ?Interim History:  Jimmy Blankenship is here today for follow-up and treatment.  He did have a CT scan done recently.  Thankfully, the CT scan showed that he was responding.  What was in the stomach was smaller.  What he had in the liver was also smaller. ? ?He feels okay.  He is eating well.  His blood sugars are actually under pretty good control. ? ?He has had no problems with bleeding.  He is on maintenance Xarelto. ? ?He has had no problems with bowels or bladder.  He has had no issues with diarrhea. ? ?He has had no leg swelling. ? ?He wants to go back to [place] in September. ? ?He has had no chest wall pain.  He has had no cough or shortness of breath. ? ?There is been no headaches. ? ?Overall, his performance status is ECOG 1.   ? ? ?Medications:  ?Allergies as of 11/23/2021   ?No Known Allergies ?  ? ?  ?Medication List  ?  ? ?  ? Accurate as of November 23, 2021  2:27 PM. If you have any questions, ask your nurse or doctor.  ?  ?  ? ?  ? ?atorvastatin 80 MG tablet ?Commonly known as: LIPITOR ?Take 1 tablet (80 mg total) by mouth daily. ?  ?dexamethasone 4 MG tablet ?Commonly known as: DECADRON ?Take 2 tablets (8 mg total) by mouth daily. Start the day after chemotherapy for 2 days. ?  ?diphenoxylate-atropine 2.5-0.025 MG tablet ?Commonly known as: LOMOTIL ?TAKE ONE TABLET BY MOUTH FOUR TIMES A DAY AS NEEDED FOR DIARRHEA OR LOOSE  STOOLS ?  ?feeding supplement Liqd ?Take 237 mLs by mouth 2 (two) times daily between meals. ?  ?gabapentin 300 MG capsule ?Commonly known as: NEURONTIN ?Take 1 capsule (300 mg total) by mouth 4 (four) times daily. ?  ?glipiZIDE 5 MG tablet ?Commonly known as: GLUCOTROL ?Take 5 mg by mouth 2 (two) times daily before a meal. ?  ?lidocaine-prilocaine cream ?Commonly known as: EMLA ?Apply to affected area once ?  ?LORazepam 0.5 MG tablet ?Commonly known as: Ativan ?Take 1 tablet (0.5 mg total) by mouth every 6 (six) hours as needed (Nausea or vomiting). ?  ?megestrol 400 MG/10ML suspension ?Commonly known as: MEGACE ?Take 10 mLs (400 mg total) by mouth 2 (two) times daily. ?  ?melatonin 5 MG Tabs ?Take 5 mg by mouth at bedtime as needed. ?  ?metFORMIN 1000 MG tablet ?Commonly known as: GLUCOPHAGE ?Take 1 tablet by mouth 2 (two) times daily with a meal. ?  ?metoprolol tartrate 50 MG tablet ?Commonly known as: LOPRESSOR ?TAKE ONE TABLET BY MOUTH TWICE A DAY ?  ?ondansetron 8 MG tablet ?Commonly known as: Zofran ?Take 1 tablet (8 mg total) by mouth 2 (two) times daily as needed for refractory nausea / vomiting. Start on day 3 after chemo. ?  ?pantoprazole 40 MG tablet ?Commonly known   as: PROTONIX ?Take 1 tablet (40 mg total) by mouth 2 (two) times daily. ?  ?potassium chloride SA 20 MEQ tablet ?Commonly known as: KLOR-CON M ?Take 1 tablet (20 mEq total) by mouth 2 (two) times daily. ?  ?prochlorperazine 10 MG tablet ?Commonly known as: COMPAZINE ?Take 1 tablet (10 mg total) by mouth every 6 (six) hours as needed (Nausea or vomiting). ?  ?tamsulosin 0.4 MG Caps capsule ?Commonly known as: FLOMAX ?Take 0.4 mg by mouth at bedtime. ?  ?Xarelto 10 MG Tabs tablet ?Generic drug: rivaroxaban ?TAKE ONE TABLET BY MOUTH DAILY WITH SUPPER ?  ? ?  ? ? ?Allergies: No Known Allergies ? ?Past Medical History, Surgical history, Social history, and Family History were reviewed and updated. ? ?Review of Systems: ?Review of Systems   ?Constitutional: Negative.   ?HENT: Negative.    ?Eyes: Negative.   ?Respiratory: Negative.    ?Cardiovascular: Negative.   ?Gastrointestinal:  Positive for blood in stool and diarrhea.  ?Genitourinary: Negative.   ?Musculoskeletal: Negative.   ?Skin: Negative.   ?Neurological: Negative.   ?Endo/Heme/Allergies: Negative.   ?Psychiatric/Behavioral: Negative.    ? ? ?Physical Exam: ? weight is 66.7 kg. His oral temperature is 98.6 ?F (37 ?C). His blood pressure is 118/65 and his pulse is 91. His respiration is 17 and oxygen saturation is 100%.  ? ?Wt Readings from Last 3 Encounters:  ?11/23/21 66.7 kg  ?11/23/21 66.7 kg  ?10/31/21 65.2 kg  ? ? ?Physical Exam ?Vitals reviewed.  ?HENT:  ?   Head: Normocephalic and atraumatic.  ?Eyes:  ?   Pupils: Pupils are equal, round, and reactive to light.  ?Cardiovascular:  ?   Rate and Rhythm: Normal rate and regular rhythm.  ?   Heart sounds: Normal heart sounds.  ?Pulmonary:  ?   Effort: Pulmonary effort is normal.  ?   Breath sounds: Normal breath sounds.  ?Abdominal:  ?   General: Bowel sounds are normal.  ?   Palpations: Abdomen is soft.  ?Musculoskeletal:     ?   General: No tenderness or deformity. Normal range of motion.  ?   Cervical back: Normal range of motion.  ?Lymphadenopathy:  ?   Cervical: No cervical adenopathy.  ?Skin: ?   General: Skin is warm and dry.  ?   Findings: No erythema or rash.  ?Neurological:  ?   Mental Status: He is alert and oriented to person, place, and time.  ?Psychiatric:     ?   Behavior: Behavior normal.     ?   Thought Content: Thought content normal.     ?   Judgment: Judgment normal.  ? ? ? ?Lab Results  ?Component Value Date  ? WBC 5.4 11/23/2021  ? HGB 9.0 (L) 11/23/2021  ? HCT 27.0 (L) 11/23/2021  ? MCV 104.7 (H) 11/23/2021  ? PLT 167 11/23/2021  ? ?Lab Results  ?Component Value Date  ? FERRITIN 877 (H) 11/23/2021  ? IRON 66 11/23/2021  ? TIBC 291 11/23/2021  ? UIBC 225 11/23/2021  ? IRONPCTSAT 23 11/23/2021  ? ?Lab Results  ?Component  Value Date  ? RETICCTPCT 3.0 06/28/2021  ? RBC 2.58 (L) 11/23/2021  ? ?No results found for: KPAFRELGTCHN, LAMBDASER, KAPLAMBRATIO ?No results found for: IGGSERUM, IGA, IGMSERUM ?No results found for: TOTALPROTELP, ALBUMINELP, A1GS, A2GS, BETS, BETA2SER, GAMS, MSPIKE, SPEI ?  Chemistry   ?   ?Component Value Date/Time  ? NA 140 11/23/2021 0915  ? NA 140 06/10/2020 0935  ?  K 3.1 (L) 11/23/2021 0915  ? CL 108 11/23/2021 0915  ? CO2 22 11/23/2021 0915  ? BUN 9 11/23/2021 0915  ? BUN 17 06/10/2020 0935  ? CREATININE 0.82 11/23/2021 0915  ?    ?Component Value Date/Time  ? CALCIUM 9.0 11/23/2021 0915  ? ALKPHOS 138 (H) 11/23/2021 0915  ? AST 28 11/23/2021 0915  ? ALT 30 11/23/2021 0915  ? BILITOT 0.6 11/23/2021 0915  ?  ? ? ? ?Impression and Plan: Jimmy Blankenship is a very pleasant 71 yo [place] gentleman with metastatic adenocarcinoma of the stomach.  ? ?He completed 7 cycles of chemotherapy along with immunotherapy and targeted therapy.  He then began to have some disease progression and was having a hard time tolerating treatment. ? ?I think is doing well on the Enhertu.  The CT scan shows that he is responding.  I am happy for this.  He is tolerated the Enhertu quite nicely. ? ?We will plan to get her back in 3 more weeks for follow-up.   ?Peter R Ennever, MD ?3/16/20232:27 PM ? ?

## 2021-12-13 ENCOUNTER — Inpatient Hospital Stay (HOSPITAL_BASED_OUTPATIENT_CLINIC_OR_DEPARTMENT_OTHER): Payer: Medicare Other | Admitting: Family

## 2021-12-13 ENCOUNTER — Other Ambulatory Visit: Payer: Self-pay

## 2021-12-13 ENCOUNTER — Encounter: Payer: Self-pay | Admitting: Family

## 2021-12-13 ENCOUNTER — Inpatient Hospital Stay: Payer: Medicare Other

## 2021-12-13 ENCOUNTER — Other Ambulatory Visit: Payer: Self-pay | Admitting: Hematology & Oncology

## 2021-12-13 ENCOUNTER — Inpatient Hospital Stay: Payer: Medicare Other | Attending: Hematology & Oncology

## 2021-12-13 VITALS — BP 110/66 | HR 96 | Temp 97.0°F | Resp 18 | Ht 69.0 in | Wt 141.0 lb

## 2021-12-13 VITALS — BP 88/53

## 2021-12-13 DIAGNOSIS — C165 Malignant neoplasm of lesser curvature of stomach, unspecified: Secondary | ICD-10-CM

## 2021-12-13 DIAGNOSIS — K5900 Constipation, unspecified: Secondary | ICD-10-CM | POA: Insufficient documentation

## 2021-12-13 DIAGNOSIS — C787 Secondary malignant neoplasm of liver and intrahepatic bile duct: Secondary | ICD-10-CM | POA: Insufficient documentation

## 2021-12-13 DIAGNOSIS — R200 Anesthesia of skin: Secondary | ICD-10-CM | POA: Diagnosis not present

## 2021-12-13 DIAGNOSIS — D500 Iron deficiency anemia secondary to blood loss (chronic): Secondary | ICD-10-CM | POA: Diagnosis not present

## 2021-12-13 DIAGNOSIS — Z79899 Other long term (current) drug therapy: Secondary | ICD-10-CM | POA: Diagnosis not present

## 2021-12-13 DIAGNOSIS — Z7901 Long term (current) use of anticoagulants: Secondary | ICD-10-CM | POA: Insufficient documentation

## 2021-12-13 DIAGNOSIS — Z5112 Encounter for antineoplastic immunotherapy: Secondary | ICD-10-CM | POA: Insufficient documentation

## 2021-12-13 DIAGNOSIS — G629 Polyneuropathy, unspecified: Secondary | ICD-10-CM | POA: Diagnosis not present

## 2021-12-13 DIAGNOSIS — R197 Diarrhea, unspecified: Secondary | ICD-10-CM | POA: Insufficient documentation

## 2021-12-13 DIAGNOSIS — E876 Hypokalemia: Secondary | ICD-10-CM

## 2021-12-13 DIAGNOSIS — R5383 Other fatigue: Secondary | ICD-10-CM | POA: Diagnosis not present

## 2021-12-13 DIAGNOSIS — D649 Anemia, unspecified: Secondary | ICD-10-CM

## 2021-12-13 DIAGNOSIS — R0602 Shortness of breath: Secondary | ICD-10-CM | POA: Diagnosis not present

## 2021-12-13 DIAGNOSIS — C799 Secondary malignant neoplasm of unspecified site: Secondary | ICD-10-CM

## 2021-12-13 DIAGNOSIS — K922 Gastrointestinal hemorrhage, unspecified: Secondary | ICD-10-CM | POA: Diagnosis not present

## 2021-12-13 DIAGNOSIS — C169 Malignant neoplasm of stomach, unspecified: Secondary | ICD-10-CM

## 2021-12-13 LAB — CBC WITH DIFFERENTIAL (CANCER CENTER ONLY)
Abs Immature Granulocytes: 0.07 10*3/uL (ref 0.00–0.07)
Basophils Absolute: 0 10*3/uL (ref 0.0–0.1)
Basophils Relative: 0 %
Eosinophils Absolute: 0.1 10*3/uL (ref 0.0–0.5)
Eosinophils Relative: 1 %
HCT: 24.1 % — ABNORMAL LOW (ref 39.0–52.0)
Hemoglobin: 8 g/dL — ABNORMAL LOW (ref 13.0–17.0)
Immature Granulocytes: 1 %
Lymphocytes Relative: 7 %
Lymphs Abs: 0.6 10*3/uL — ABNORMAL LOW (ref 0.7–4.0)
MCH: 35.6 pg — ABNORMAL HIGH (ref 26.0–34.0)
MCHC: 33.2 g/dL (ref 30.0–36.0)
MCV: 107.1 fL — ABNORMAL HIGH (ref 80.0–100.0)
Monocytes Absolute: 1.1 10*3/uL — ABNORMAL HIGH (ref 0.1–1.0)
Monocytes Relative: 12 %
Neutro Abs: 6.9 10*3/uL (ref 1.7–7.7)
Neutrophils Relative %: 79 %
Platelet Count: 137 10*3/uL — ABNORMAL LOW (ref 150–400)
RBC: 2.25 MIL/uL — ABNORMAL LOW (ref 4.22–5.81)
RDW: 17.1 % — ABNORMAL HIGH (ref 11.5–15.5)
WBC Count: 8.8 10*3/uL (ref 4.0–10.5)
nRBC: 0.2 % (ref 0.0–0.2)

## 2021-12-13 LAB — CMP (CANCER CENTER ONLY)
ALT: 20 U/L (ref 0–44)
AST: 19 U/L (ref 15–41)
Albumin: 3.3 g/dL — ABNORMAL LOW (ref 3.5–5.0)
Alkaline Phosphatase: 127 U/L — ABNORMAL HIGH (ref 38–126)
Anion gap: 9 (ref 5–15)
BUN: 13 mg/dL (ref 8–23)
CO2: 19 mmol/L — ABNORMAL LOW (ref 22–32)
Calcium: 8.8 mg/dL — ABNORMAL LOW (ref 8.9–10.3)
Chloride: 105 mmol/L (ref 98–111)
Creatinine: 0.92 mg/dL (ref 0.61–1.24)
GFR, Estimated: >60 mL/min (ref >60)
Glucose, Bld: 207 mg/dL — ABNORMAL HIGH (ref 70–99)
Potassium: 3.9 mmol/L (ref 3.5–5.1)
Sodium: 133 mmol/L — ABNORMAL LOW (ref 135–145)
Total Bilirubin: 1.8 mg/dL — ABNORMAL HIGH (ref 0.3–1.2)
Total Protein: 5.4 g/dL — ABNORMAL LOW (ref 6.5–8.1)

## 2021-12-13 LAB — LACTATE DEHYDROGENASE: LDH: 188 U/L (ref 98–192)

## 2021-12-13 LAB — PREPARE RBC (CROSSMATCH)

## 2021-12-13 MED ORDER — ACETAMINOPHEN 325 MG PO TABS
650.0000 mg | ORAL_TABLET | Freq: Once | ORAL | Status: AC
  Administered 2021-12-13: 650 mg via ORAL
  Filled 2021-12-13: qty 2

## 2021-12-13 MED ORDER — DIPHENHYDRAMINE HCL 25 MG PO CAPS
50.0000 mg | ORAL_CAPSULE | Freq: Once | ORAL | Status: AC
  Administered 2021-12-13: 50 mg via ORAL
  Filled 2021-12-13: qty 2

## 2021-12-13 MED ORDER — SODIUM CHLORIDE 0.9% FLUSH
10.0000 mL | INTRAVENOUS | Status: DC | PRN
  Administered 2021-12-13: 10 mL

## 2021-12-13 MED ORDER — FAM-TRASTUZUMAB DERUXTECAN-NXKI CHEMO 100 MG IV SOLR: 6.4000 mg/kg | Freq: Once | INTRAVENOUS | Status: DC

## 2021-12-13 MED ORDER — PALONOSETRON HCL INJECTION 0.25 MG/5ML
0.2500 mg | Freq: Once | INTRAVENOUS | Status: AC
  Administered 2021-12-13: 0.25 mg via INTRAVENOUS
  Filled 2021-12-13: qty 5

## 2021-12-13 MED ORDER — FAM-TRASTUZUMAB DERUXTECAN-NXKI CHEMO 100 MG IV SOLR
400.0000 mg | Freq: Once | INTRAVENOUS | Status: AC
  Administered 2021-12-13: 400 mg via INTRAVENOUS
  Filled 2021-12-13: qty 20

## 2021-12-13 MED ORDER — POTASSIUM CHLORIDE CRYS ER 20 MEQ PO TBCR: 40.0000 meq | EXTENDED_RELEASE_TABLET | Freq: Once | ORAL | Status: DC

## 2021-12-13 MED ORDER — SODIUM CHLORIDE 0.9 % IV SOLN
10.0000 mg | Freq: Once | INTRAVENOUS | Status: AC
  Administered 2021-12-13: 10 mg via INTRAVENOUS
  Filled 2021-12-13: qty 10

## 2021-12-13 MED ORDER — DEXTROSE 5 % IV SOLN: Freq: Once | INTRAVENOUS | Status: AC

## 2021-12-13 MED ORDER — HEPARIN SOD (PORK) LOCK FLUSH 100 UNIT/ML IV SOLN
500.0000 [IU] | Freq: Once | INTRAVENOUS | Status: AC | PRN
  Administered 2021-12-13: 500 [IU]

## 2021-12-13 NOTE — Progress Notes (Addendum)
?Hematology and Oncology Follow Up Visit ? ?Jimmy Blankenship ?[id number] ?[birth year] 71 y.o. ?12/13/2021 ? ? 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  ?Current Therapy:        ?FOLFOX/Nivolumab/Herceptin -- s/p cycle #8 --started on 09/19/2020 -- d/c on 01/26/2021 ?Enhertu -- IV q 3 weeks -- started 02/01/2021, s/p cycle #9 ?IV iron as indicated-last dose given on 10/05/2020 ?Xarelto 10 mg po q day -- start on 03/02/2021 and dose reduced to maintenance on 03/29/2021 ?  ?Interim History: Mr. Needle is here today with his interpretor for follow-up and treatment.  ?He notes fatigue, mild SOB with exertion and increased numbness and tingling in his extremities.  ?Hgb is down at 8.0 from 9.0.  ?No fever, chills, n/v, cough, rash, dizziness, chest pain, palpitations, abdominal pain or changes in bowel or bladder habits at this time.  ?He has not noted any obvious blood loss. No bruising or petechiae.  ?He has intermittent constipation and diarrhea right after each treatment. This is unchanged from baseline.  ?He has been eating well and is staying hydrated with water and nutritional drinks.  ?His weight is 141 lbs.  ? ?ECOG Performance Status: 1 - Symptomatic but completely ambulatory ? ?Medications:  ?Allergies as of 12/13/2021   ?No Known Allergies ?  ? ?  ?Medication List  ?  ? ?  ? Accurate as of December 13, 2021 10:29 AM. If you have any questions, ask your nurse or doctor.  ?  ?  ? ?  ? ?atorvastatin 80 MG tablet ?Commonly known as: LIPITOR ?Take 1 tablet (80 mg total) by mouth daily. ?  ?dexamethasone 4 MG tablet ?Commonly known as: DECADRON ?Take 2 tablets (8 mg total) by mouth daily. Start the day after chemotherapy for 2 days. ?  ?diphenoxylate-atropine 2.5-0.025 MG tablet ?Commonly known as: LOMOTIL ?TAKE ONE TABLET BY MOUTH FOUR TIMES A DAY AS NEEDED FOR DIARRHEA OR LOOSE STOOLS ?  ?feeding  supplement Liqd ?Take 237 mLs by mouth 2 (two) times daily between meals. ?  ?gabapentin 300 MG capsule ?Commonly known as: NEURONTIN ?Take 1 capsule (300 mg total) by mouth 4 (four) times daily. ?  ?glipiZIDE 5 MG tablet ?Commonly known as: GLUCOTROL ?Take 5 mg by mouth 2 (two) times daily before a meal. ?  ?lidocaine-prilocaine cream ?Commonly known as: EMLA ?Apply to affected area once ?  ?LORazepam 0.5 MG tablet ?Commonly known as: Ativan ?Take 1 tablet (0.5 mg total) by mouth every 6 (six) hours as needed (Nausea or vomiting). ?  ?megestrol 400 MG/10ML suspension ?Commonly known as: MEGACE ?Take 10 mLs (400 mg total) by mouth 2 (two) times daily. ?  ?melatonin 5 MG Tabs ?Take 5 mg by mouth at bedtime as needed. ?  ?metFORMIN 1000 MG tablet ?Commonly known as: GLUCOPHAGE ?Take 1 tablet by mouth 2 (two) times daily with a meal. ?  ?metoprolol tartrate 50 MG tablet ?Commonly known as: LOPRESSOR ?TAKE ONE TABLET BY MOUTH TWICE A DAY ?  ?ondansetron 8 MG tablet ?Commonly known as: Zofran ?Take 1 tablet (8 mg total) by mouth 2 (two) times daily as needed for refractory nausea / vomiting. Start on day 3 after chemo. ?  ?pantoprazole 40 MG tablet ?Commonly known as: PROTONIX ?Take 1 tablet (40 mg total) by mouth 2 (two) times daily. ?  ?potassium chloride SA 20 MEQ tablet ?Commonly known as: KLOR-CON M ?Take 1 tablet (20  mEq total) by mouth 2 (two) times daily. ?  ?prochlorperazine 10 MG tablet ?Commonly known as: COMPAZINE ?Take 1 tablet (10 mg total) by mouth every 6 (six) hours as needed (Nausea or vomiting). ?  ?tamsulosin 0.4 MG Caps capsule ?Commonly known as: FLOMAX ?Take 0.4 mg by mouth at bedtime. ?  ?Xarelto 10 MG Tabs tablet ?Generic drug: rivaroxaban ?TAKE ONE TABLET BY MOUTH DAILY WITH SUPPER ?  ? ?  ? ? ?Allergies: No Known Allergies ? ?Past Medical History, Surgical history, Social history, and Family History were reviewed and updated. ? ?Review of Systems: ?All other 10 point review of systems is  negative.  ? ?Physical Exam: ? height is _0  (1.753 m) and weight is 141 lb (64 kg). His oral temperature is 97 ?F (36.1 ?C) (abnormal). His blood pressure is 110/66 and his pulse is 96. His respiration is 18 and oxygen saturation is 100%.  ? ?Wt Readings from Last 3 Encounters:  ?12/13/21 141 lb (64 kg)  ?11/23/21 147 lb (66.7 kg)  ?11/23/21 147 lb 0.8 oz (66.7 kg)  ? ? ?Ocular: Sclerae unicteric, pupils equal, round and reactive to light ?Ear-nose-throat: Oropharynx clear, dentition fair ?Lymphatic: No cervical or supraclavicular adenopathy ?Lungs no rales or rhonchi, good excursion bilaterally ?Heart regular rate and rhythm, no murmur appreciated ?Abd soft, nontender, positive bowel sounds ?MSK no focal spinal tenderness, no joint edema ?Neuro: non-focal, well-oriented, appropriate affect ?Breasts: Deferred  ? ?Lab Results  ?Component Value Date  ? WBC 8.8 12/13/2021  ? HGB 8.0 (L) 12/13/2021  ? HCT 24.1 (L) 12/13/2021  ? MCV 107.1 (H) 12/13/2021  ? PLT 137 (L) 12/13/2021  ? ?Lab Results  ?Component Value Date  ? FERRITIN 877 (H) 11/23/2021  ? IRON 66 11/23/2021  ? TIBC 291 11/23/2021  ? UIBC 225 11/23/2021  ? IRONPCTSAT 23 11/23/2021  ? ?Lab Results  ?Component Value Date  ? RETICCTPCT 3.0 06/28/2021  ? RBC 2.25 (L) 12/13/2021  ? ?No results found for: KPAFRELGTCHN, LAMBDASER, KAPLAMBRATIO ?No results found for: IGGSERUM, IGA, IGMSERUM ?No results found for: TOTALPROTELP, ALBUMINELP, A1GS, A2GS, BETS, BETA2SER, GAMS, MSPIKE, SPEI ?  Chemistry   ?   ?Component Value Date/Time  ? NA 140 11/23/2021 0915  ? NA 140 06/10/2020 0935  ? K 3.1 (L) 11/23/2021 0915  ? CL 108 11/23/2021 0915  ? CO2 22 11/23/2021 0915  ? BUN 9 11/23/2021 0915  ? BUN 17 06/10/2020 0935  ? CREATININE 0.82 11/23/2021 0915  ?    ?Component Value Date/Time  ? CALCIUM 9.0 11/23/2021 0915  ? ALKPHOS 138 (H) 11/23/2021 0915  ? AST 28 11/23/2021 0915  ? ALT 30 11/23/2021 0915  ? BILITOT 0.6 11/23/2021 0915  ?  ? ? ? ?Impression and Plan: [name]  is a very pleasant 71 yo [name] gentleman with metastatic adenocarcinoma of the stomach. He completed 7 cycles of chemotherapy along with immunotherapy and targeted therapy. He then began to have some disease progression and was having a hard time tolerating treatment. ?He is tolerated Enhurtu well and his recent CT scans showed he continues to have a nice response.  ?We will proceed with treatment today as planned.  ?We will also give him 2 units of blood tomorrow.  ?Follow-up in 3 weeks.  ? ?Lottie Dawson, NP ?4/5/202310:29 AM ? ?

## 2021-12-13 NOTE — Patient Instructions (Signed)
[facility]  Discharge Instructions: Thank you for choosing [facility] to provide your oncology and hematology care.   If you have a lab appointment with the Cancer Center, please go directly to the Cancer Center and check in at the registration area.  Wear comfortable clothing and clothing appropriate for easy access to any Portacath or PICC line.   We strive to give you quality time with your provider. You may need to reschedule your appointment if you arrive late (15 or more minutes).  Arriving late affects you and other patients whose appointments are after yours.  Also, if you miss three or more appointments without notifying the office, you may be dismissed from the clinic at the provider's discretion.      For prescription refill requests, have your pharmacy contact our office and allow 72 hours for refills to be completed.    Today you received the following chemotherapy and/or immunotherapy agents Enhurtu      To help prevent nausea and vomiting after your treatment, we encourage you to take your nausea medication as directed.  BELOW ARE SYMPTOMS THAT SHOULD BE REPORTED IMMEDIATELY: *FEVER GREATER THAN 100.4 F (38 C) OR HIGHER *CHILLS OR SWEATING *NAUSEA AND VOMITING THAT IS NOT CONTROLLED WITH YOUR NAUSEA MEDICATION *UNUSUAL SHORTNESS OF BREATH *UNUSUAL BRUISING OR BLEEDING *URINARY PROBLEMS (pain or burning when urinating, or frequent urination) *BOWEL PROBLEMS (unusual diarrhea, constipation, pain near the anus) TENDERNESS IN MOUTH AND THROAT WITH OR WITHOUT PRESENCE OF ULCERS (sore throat, sores in mouth, or a toothache) UNUSUAL RASH, SWELLING OR PAIN  UNUSUAL VAGINAL DISCHARGE OR ITCHING   Items with * indicate a potential emergency and should be followed up as soon as possible or go to the Emergency Department if any problems should occur.  Please show the CHEMOTHERAPY ALERT CARD or IMMUNOTHERAPY ALERT CARD at check-in to the  Emergency Department and triage nurse. Should you have questions after your visit or need to cancel or reschedule your appointment, please contact [facility]  [phone] and follow the prompts.  Office hours are 8:00 a.m. to 4:30 p.m. Monday - Friday. Please note that voicemails left after 4:00 p.m. may not be returned until the following business day.  We are closed weekends and major holidays. You have access to a nurse at all times for urgent questions. Please call the main number to the clinic [phone] and follow the prompts.  For any non-urgent questions, you may also contact your provider using MyChart. We now offer e-Visits for anyone 18 and older to request care online for non-urgent symptoms. For details visit [email or web address].   Also download the MyChart app! Go to the app store, search "MyChart", open the app, select [facility], and log in with your MyChart username and password.  Due to Covid, a mask is required upon entering the hospital/clinic. If you do not have a mask, one will be given to you upon arrival. For doctor visits, patients may have 1 support person aged 18 or older with them. For treatment visits, patients cannot have anyone with them due to current Covid guidelines and our immunocompromised population.  

## 2021-12-13 NOTE — Patient Instructions (Signed)

## 2021-12-14 ENCOUNTER — Inpatient Hospital Stay: Payer: Medicare Other

## 2021-12-14 ENCOUNTER — Other Ambulatory Visit: Payer: Self-pay | Admitting: *Deleted

## 2021-12-14 VITALS — BP 107/58 | HR 90 | Temp 98.0°F | Resp 17

## 2021-12-14 DIAGNOSIS — D649 Anemia, unspecified: Secondary | ICD-10-CM

## 2021-12-14 DIAGNOSIS — C165 Malignant neoplasm of lesser curvature of stomach, unspecified: Secondary | ICD-10-CM

## 2021-12-14 DIAGNOSIS — C799 Secondary malignant neoplasm of unspecified site: Secondary | ICD-10-CM

## 2021-12-14 DIAGNOSIS — Z5112 Encounter for antineoplastic immunotherapy: Secondary | ICD-10-CM | POA: Diagnosis not present

## 2021-12-14 DIAGNOSIS — D500 Iron deficiency anemia secondary to blood loss (chronic): Secondary | ICD-10-CM

## 2021-12-14 DIAGNOSIS — K922 Gastrointestinal hemorrhage, unspecified: Secondary | ICD-10-CM

## 2021-12-14 DIAGNOSIS — E876 Hypokalemia: Secondary | ICD-10-CM

## 2021-12-14 LAB — IRON AND IRON BINDING CAPACITY (CC-WL,HP ONLY)
Iron: 18 ug/dL — ABNORMAL LOW (ref 45–182)
Saturation Ratios: 6 % — ABNORMAL LOW (ref 17.9–39.5)
TIBC: 291 ug/dL (ref 250–450)
UIBC: 273 ug/dL (ref 117–376)

## 2021-12-14 LAB — FERRITIN: Ferritin: 833 ng/mL — ABNORMAL HIGH (ref 24–336)

## 2021-12-14 MED ORDER — HEPARIN SOD (PORK) LOCK FLUSH 100 UNIT/ML IV SOLN
500.0000 [IU] | Freq: Every day | INTRAVENOUS | Status: AC | PRN
  Administered 2021-12-14: 500 [IU]

## 2021-12-14 MED ORDER — POTASSIUM CHLORIDE CRYS ER 20 MEQ PO TBCR: 20.0000 meq | EXTENDED_RELEASE_TABLET | Freq: Two times a day (BID) | ORAL | 0 refills | Status: DC

## 2021-12-14 MED ORDER — ACETAMINOPHEN 325 MG PO TABS: 650.0000 mg | ORAL_TABLET | Freq: Once | ORAL | Status: DC

## 2021-12-14 MED ORDER — SODIUM CHLORIDE 0.9% IV SOLUTION
250.0000 mL | Freq: Once | INTRAVENOUS | Status: AC
  Administered 2021-12-14: 250 mL via INTRAVENOUS

## 2021-12-14 MED ORDER — SODIUM CHLORIDE 0.9 % IV SOLN
200.0000 mg | Freq: Once | INTRAVENOUS | Status: AC
  Administered 2021-12-14: 200 mg via INTRAVENOUS
  Filled 2021-12-14: qty 10

## 2021-12-14 MED ORDER — DIPHENHYDRAMINE HCL 25 MG PO CAPS: 25.0000 mg | ORAL_CAPSULE | Freq: Once | ORAL | Status: DC

## 2021-12-14 NOTE — Patient Instructions (Signed)
Blood Transfusion, Adult, Care After This sheet gives you information about how to care for yourself after your procedure. Your doctor may also give you more specific instructions. If you have problems or questions, contact your doctor. What can I expect after the procedure? After the procedure, it is common to have: Bruising and soreness at the IV site. A headache. Follow these instructions at home: Insertion site care   Follow instructions from your doctor about how to take care of your insertion site. This is where an IV tube was put into your vein. Make sure you: Wash your hands with soap and water before and after you change your bandage (dressing). If you cannot use soap and water, use hand sanitizer. Change your bandage as told by your doctor. Check your insertion site every day for signs of infection. Check for: Redness, swelling, or pain. Bleeding from the site. Warmth. Pus or a bad smell. General instructions Take over-the-counter and prescription medicines only as told by your doctor. Rest as told by your doctor. Go back to your normal activities as told by your doctor. Keep all follow-up visits as told by your doctor. This is important. Contact a doctor if: You have itching or red, swollen areas of skin (hives). You feel worried or nervous (anxious). You feel weak after doing your normal activities. You have redness, swelling, warmth, or pain around the insertion site. You have blood coming from the insertion site, and the blood does not stop with pressure. You have pus or a bad smell coming from the insertion site. Get help right away if: You have signs of a serious reaction. This may be coming from an allergy or the body's defense system (immune system). Signs include: Trouble breathing or shortness of breath. Swelling of the face or feeling warm (flushed). Fever or chills. Head, chest, or back pain. Dark pee (urine) or blood in the pee. Widespread rash. Fast  heartbeat. Feeling dizzy or light-headed. You may receive your blood transfusion in an outpatient setting. If so, you will be told whom to contact to report any reactions. These symptoms may be an emergency. Do not wait to see if the symptoms will go away. Get medical help right away. Call your local emergency services (911 in the [place] Do not drive yourself to the hospital. Summary Bruising and soreness at the IV site are common. Check your insertion site every day for signs of infection. Rest as told by your doctor. Go back to your normal activities as told by your doctor. Get help right away if you have signs of a serious reaction. This information is not intended to replace advice given to you by your health care provider. Make sure you discuss any questions you have with your health care provider. Document Revised: 12/22/2020 Document Reviewed: 02/19/2019 Elsevier Patient Education  2022 Elsevier Inc.  

## 2021-12-15 LAB — TYPE AND SCREEN
ABO/RH(D): A POS
Antibody Screen: NEGATIVE
Unit division: 0
Unit division: 0

## 2021-12-15 LAB — BPAM RBC
Blood Product Expiration Date: 202304242359
Blood Product Expiration Date: 202304252359
ISSUE DATE / TIME: 202304060800
ISSUE DATE / TIME: 202304060800
Unit Type and Rh: 6200
Unit Type and Rh: 6200

## 2021-12-19 ENCOUNTER — Inpatient Hospital Stay: Payer: Medicare Other

## 2021-12-19 ENCOUNTER — Telehealth: Payer: Self-pay | Admitting: *Deleted

## 2021-12-19 VITALS — BP 105/53 | HR 93 | Temp 98.3°F | Resp 18

## 2021-12-19 DIAGNOSIS — Z5112 Encounter for antineoplastic immunotherapy: Secondary | ICD-10-CM | POA: Diagnosis not present

## 2021-12-19 DIAGNOSIS — K922 Gastrointestinal hemorrhage, unspecified: Secondary | ICD-10-CM

## 2021-12-19 DIAGNOSIS — D500 Iron deficiency anemia secondary to blood loss (chronic): Secondary | ICD-10-CM

## 2021-12-19 MED ORDER — SODIUM CHLORIDE 0.9 % IV SOLN: Freq: Once | INTRAVENOUS | Status: AC

## 2021-12-19 MED ORDER — HEPARIN SOD (PORK) LOCK FLUSH 100 UNIT/ML IV SOLN
250.0000 [IU] | Freq: Once | INTRAVENOUS | Status: AC | PRN
  Administered 2021-12-19: 500 [IU]

## 2021-12-19 MED ORDER — SODIUM CHLORIDE 0.9 % IV SOLN
200.0000 mg | Freq: Once | INTRAVENOUS | Status: AC
  Administered 2021-12-19: 200 mg via INTRAVENOUS
  Filled 2021-12-19: qty 200

## 2021-12-19 MED ORDER — SODIUM CHLORIDE 0.9% FLUSH
3.0000 mL | Freq: Once | INTRAVENOUS | Status: AC | PRN
  Administered 2021-12-19: 10 mL

## 2021-12-19 NOTE — Patient Instructions (Signed)

## 2021-12-19 NOTE — Telephone Encounter (Signed)
Per 12/19/21 los - called and gave upcoming appointments - confirmed ?

## 2021-12-21 ENCOUNTER — Encounter: Payer: Self-pay | Admitting: Hematology & Oncology

## 2021-12-22 ENCOUNTER — Emergency Department (HOSPITAL_COMMUNITY): Payer: Medicare Other

## 2021-12-22 ENCOUNTER — Encounter (HOSPITAL_COMMUNITY): Payer: Self-pay

## 2021-12-22 ENCOUNTER — Emergency Department (HOSPITAL_COMMUNITY)
Admission: EM | Admit: 2021-12-22 | Discharge: 2021-12-22 | Disposition: A | Discharge status: Home / Self Care | Payer: Medicare Other | Attending: Emergency Medicine | Admitting: Emergency Medicine

## 2021-12-22 DIAGNOSIS — Z7984 Long term (current) use of oral hypoglycemic drugs: Secondary | ICD-10-CM | POA: Insufficient documentation

## 2021-12-22 DIAGNOSIS — Z79899 Other long term (current) drug therapy: Secondary | ICD-10-CM | POA: Diagnosis not present

## 2021-12-22 DIAGNOSIS — E1122 Type 2 diabetes mellitus with diabetic chronic kidney disease: Secondary | ICD-10-CM | POA: Insufficient documentation

## 2021-12-22 DIAGNOSIS — K921 Melena: Secondary | ICD-10-CM | POA: Insufficient documentation

## 2021-12-22 DIAGNOSIS — N189 Chronic kidney disease, unspecified: Secondary | ICD-10-CM | POA: Insufficient documentation

## 2021-12-22 DIAGNOSIS — Z7901 Long term (current) use of anticoagulants: Secondary | ICD-10-CM | POA: Diagnosis not present

## 2021-12-22 DIAGNOSIS — I129 Hypertensive chronic kidney disease with stage 1 through stage 4 chronic kidney disease, or unspecified chronic kidney disease: Secondary | ICD-10-CM | POA: Diagnosis not present

## 2021-12-22 LAB — COMPREHENSIVE METABOLIC PANEL
ALT: 42 U/L (ref 0–44)
AST: 40 U/L (ref 15–41)
Albumin: 2.8 g/dL — ABNORMAL LOW (ref 3.5–5.0)
Alkaline Phosphatase: 166 U/L — ABNORMAL HIGH (ref 38–126)
Anion gap: 7 (ref 5–15)
BUN: 18 mg/dL (ref 8–23)
CO2: 19 mmol/L — ABNORMAL LOW (ref 22–32)
Calcium: 8.9 mg/dL (ref 8.9–10.3)
Chloride: 109 mmol/L (ref 98–111)
Creatinine, Ser: 1 mg/dL (ref 0.61–1.24)
GFR, Estimated: >60 mL/min (ref >60)
Glucose, Bld: 203 mg/dL — ABNORMAL HIGH (ref 70–99)
Potassium: 4.2 mmol/L (ref 3.5–5.1)
Sodium: 135 mmol/L (ref 135–145)
Total Bilirubin: 1 mg/dL (ref 0.3–1.2)
Total Protein: 5.9 g/dL — ABNORMAL LOW (ref 6.5–8.1)

## 2021-12-22 LAB — CBC
HCT: 30.4 % — ABNORMAL LOW (ref 39.0–52.0)
Hemoglobin: 10.1 g/dL — ABNORMAL LOW (ref 13.0–17.0)
MCH: 33.9 pg (ref 26.0–34.0)
MCHC: 33.2 g/dL (ref 30.0–36.0)
MCV: 102 fL — ABNORMAL HIGH (ref 80.0–100.0)
Platelets: 117 10*3/uL — ABNORMAL LOW (ref 150–400)
RBC: 2.98 MIL/uL — ABNORMAL LOW (ref 4.22–5.81)
RDW: 18 % — ABNORMAL HIGH (ref 11.5–15.5)
WBC: 3.8 10*3/uL — ABNORMAL LOW (ref 4.0–10.5)
nRBC: 0 % (ref 0.0–0.2)

## 2021-12-22 LAB — TYPE AND SCREEN
ABO/RH(D): A POS
Antibody Screen: NEGATIVE

## 2021-12-22 IMAGING — CT CT ABD-PELV W/ CM
2 of 5 series · 14 of 46 positions shown, 16 images · IV contrast (agent unspecified)
Comparison: [DATE]

CLINICAL DATA: Bright red blood in stools for 2 days, diffuse
abdominal pain, history of gastric cancer currently undergoing
chemotherapy

EXAM:
CT ABDOMEN AND PELVIS WITH CONTRAST
TECHNIQUE: Multidetector CT imaging of the abdomen and pelvis was performed
using the standard protocol following bolus administration of
intravenous contrast.

[Series 2: axial st · axial · 0.86mm/px · z∈[+1102,+1576]mm · 11 of 111 slices shown, 13 images]
[im 8/111  soft-tissue]
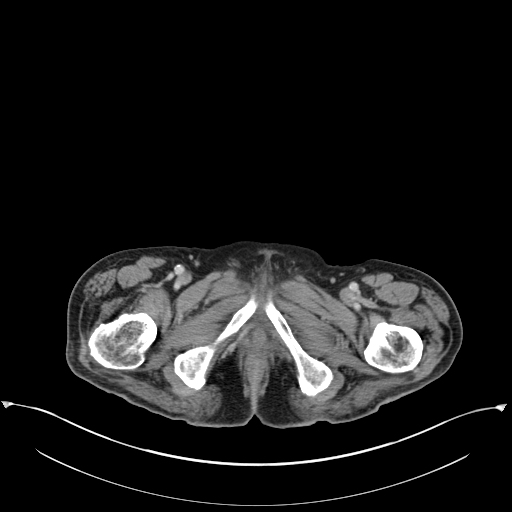
[im 8/111  bone]
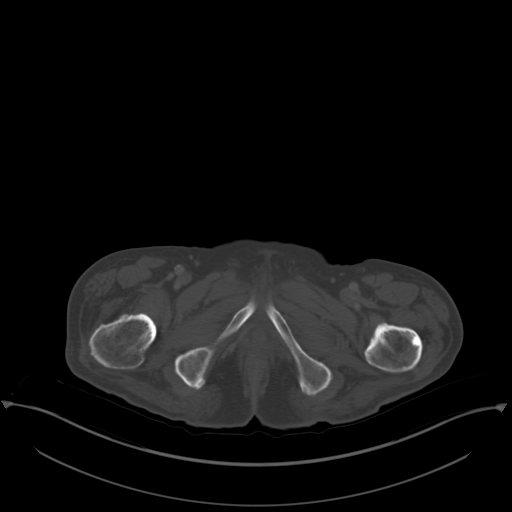
[im 16/111  soft-tissue]
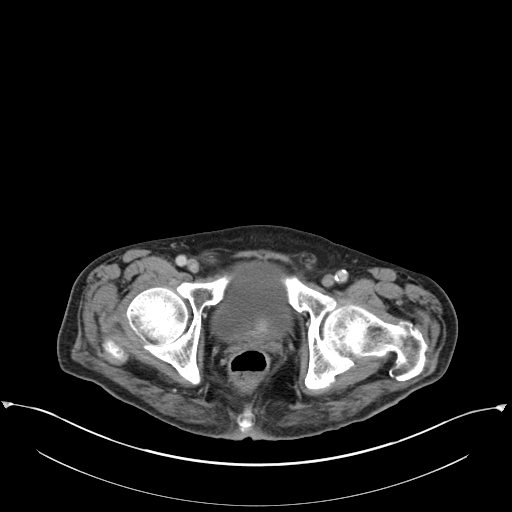
[im 24/111  soft-tissue]
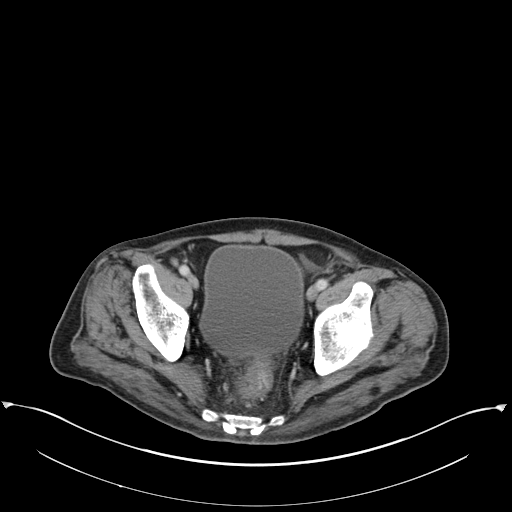
[im 40/111  soft-tissue]
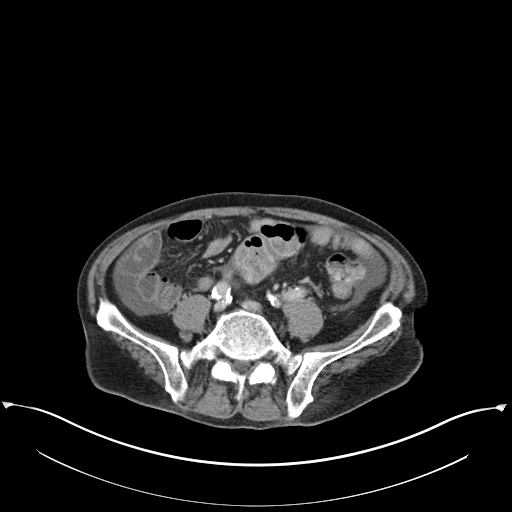
[im 48/111  soft-tissue]
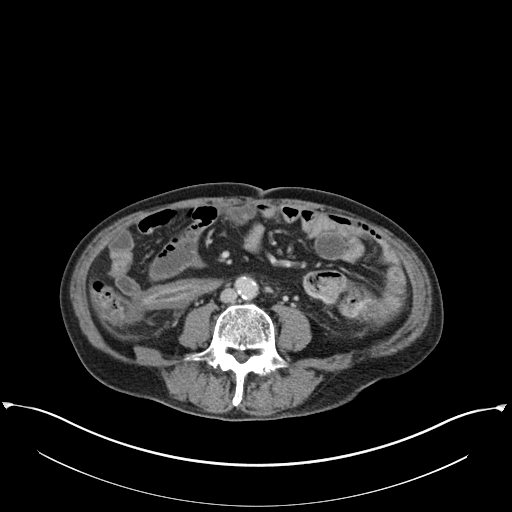
[im 56/111  soft-tissue]
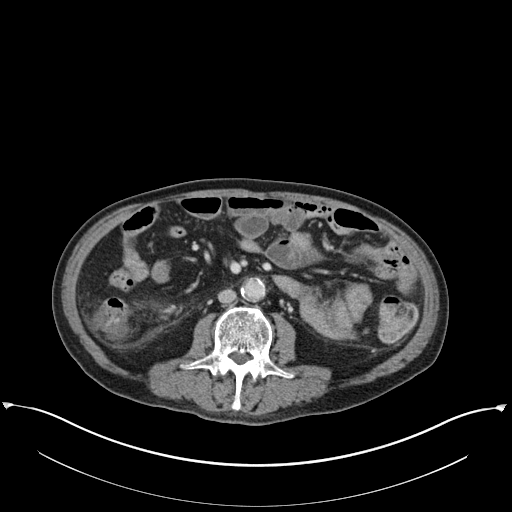
[im 63/111  soft-tissue]
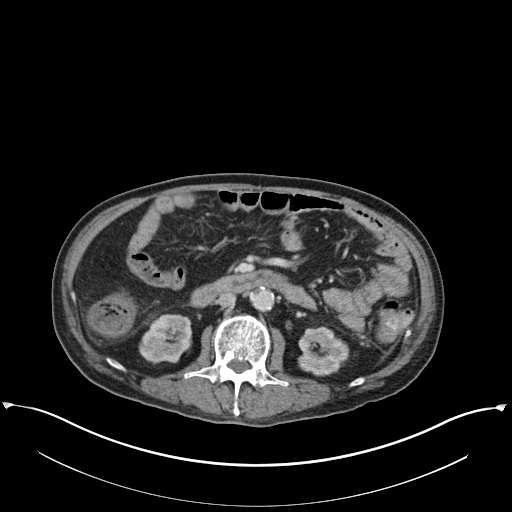
[im 71/111  soft-tissue]
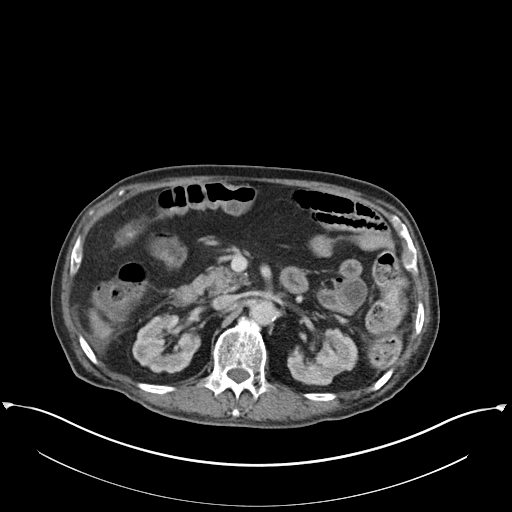
[im 87/111  soft-tissue]
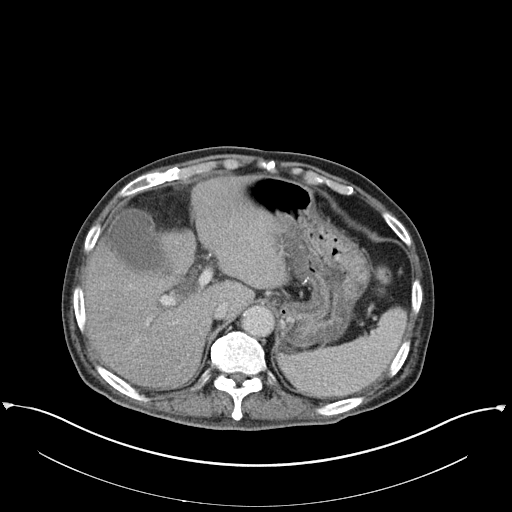
[im 87/111  bone]
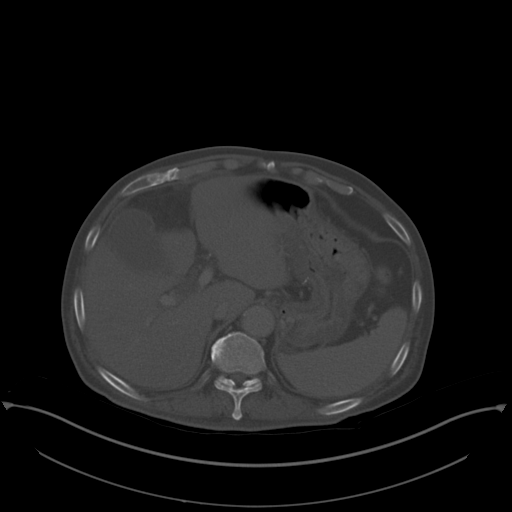
[im 95/111  soft-tissue]
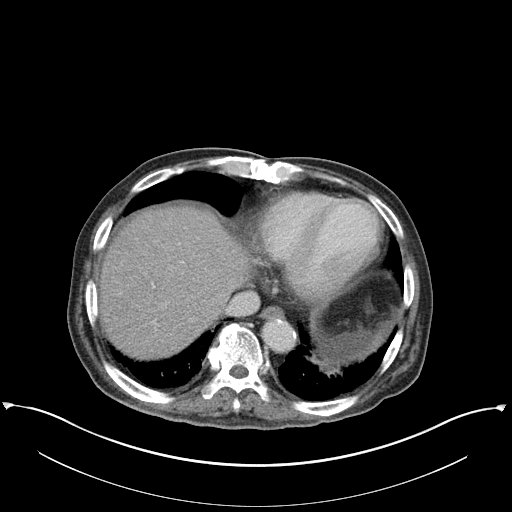
[im 103/111  soft-tissue]
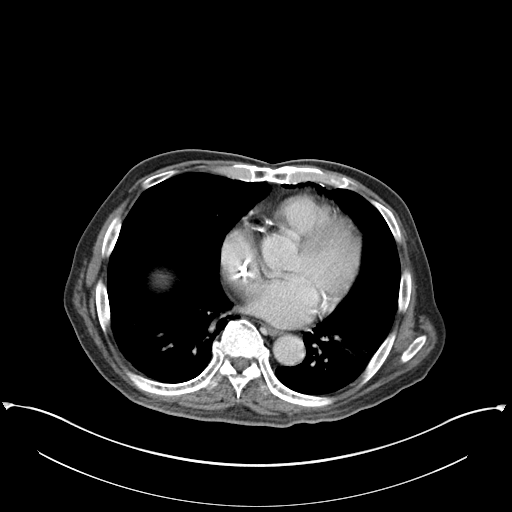

[Series 4: coronal st · coronal · 0.70mm/px · 3 of 131 slices shown]
[im 44/131  soft-tissue]
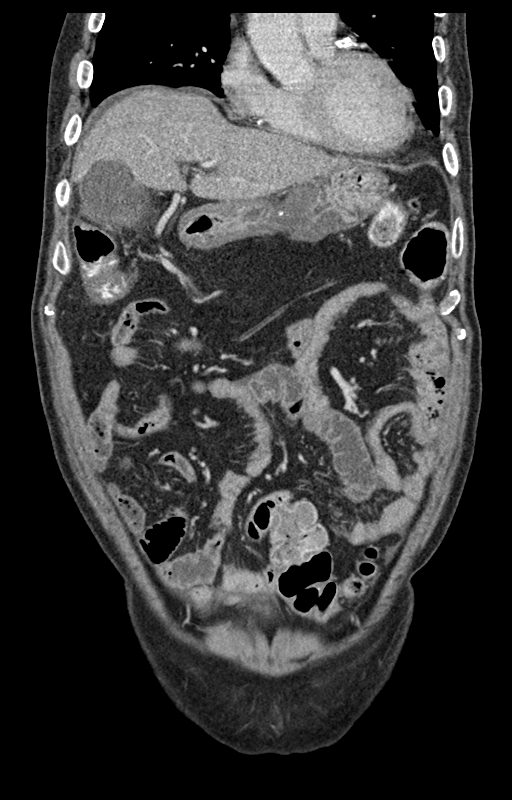
[im 58/131  soft-tissue]
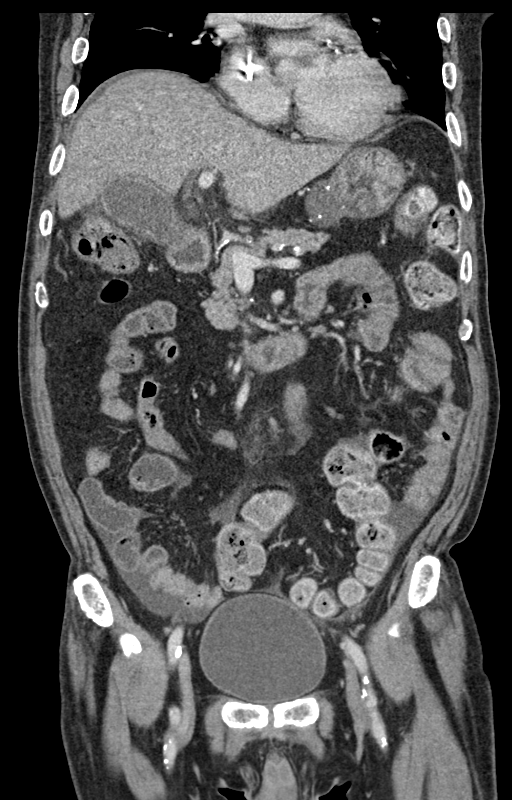
[im 73/131  soft-tissue]
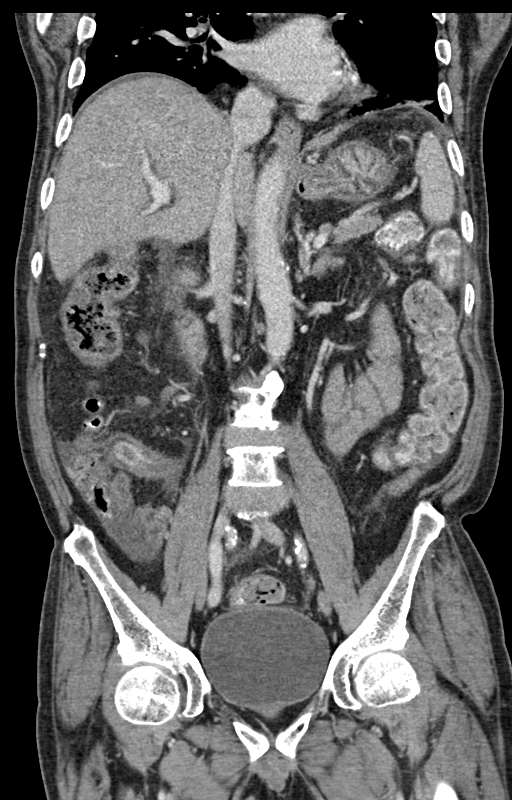

[14 of 46 positions shown; findings below may reference images not displayed]

RADIATION DOSE REDUCTION: This exam was performed according to the
departmental dose-optimization program which includes automated
exposure control, adjustment of the mA and/or kV according to
patient size and/or use of iterative reconstruction technique.

CONTRAST:  75mL OMNIPAQUE IOHEXOL 300 MG/ML  SOLN
FINDINGS: Lower chest: Scattered hypoventilatory changes and bibasilar
scarring again noted. No airspace disease, effusion, or
pneumothorax.

Hepatobiliary: Multiple stable liver hypodensities consistent with
presumed hepatic metastases. 1.4 cm hypodensity right lobe liver
image 31, 1.2 cm hypodensity right lobe liver image 27, and 1.0 cm
hypodensity left lobe liver image 21 of series 2 unchanged since
prior exam.

The gallbladder is moderately distended with no evidence of
cholelithiasis. Mild pericholecystic fat stranding is nonspecific
given the presence of trace ascites. No gallbladder wall thickening.
No biliary duct dilation.

Pancreas: Unremarkable. No pancreatic ductal dilatation or
surrounding inflammatory changes.

Spleen: Normal in size without focal abnormality.

Adrenals/Urinary Tract: Stable areas of bilateral renal cortical
thinning and scarring. Stable benign right renal cortical cysts
which do not require follow-up. No evidence of hydronephrosis or
nephrolithiasis. Bladder and adrenal glands are unremarkable.

Stomach/Bowel: The hypodense mass along the lesser curvature of the
gastric wall again identified, measuring up to 6.7 x 2.9 cm
reference image [DATE], previously measuring 7.0 x 3.3 cm. Findings
are consistent with given history of gastric neoplasm.

No bowel obstruction or ileus. Normal appendix right lower quadrant.
No acute inflammatory changes.

Vascular/Lymphatic: Aortic atherosclerosis. No enlarged abdominal or
pelvic lymph nodes.

Reproductive: Prostate is unremarkable.

Other: Trace ascites within the abdomen and pelvis unchanged. No
free intraperitoneal gas. No abdominal wall hernia.

Musculoskeletal: No acute or destructive bony lesions. Reconstructed
images demonstrate no additional findings.
IMPRESSION: 1. Stable hypodense mass within the gastric body along the lesser
curvature, consistent with history of gastric cancer.
2. Stable hypodensities within the liver consistent with presumed
hepatic metastases. No new liver lesions.
3. Trace ascites within the abdomen and pelvis unchanged.
4.  Aortic Atherosclerosis ([HC]-[HC]).

## 2021-12-22 MED ORDER — SODIUM CHLORIDE (PF) 0.9 % IJ SOLN
INTRAMUSCULAR | Status: AC
  Filled 2021-12-22: qty 50

## 2021-12-22 MED ORDER — IOHEXOL 300 MG/ML  SOLN
100.0000 mL | Freq: Once | INTRAMUSCULAR | Status: AC | PRN
  Administered 2021-12-22: 75 mL via INTRAVENOUS

## 2021-12-22 NOTE — Discharge Instructions (Signed)
Discontinue your Xarelto.  Follow-up with Dr. Marin Olp, he will reevaluate and restart this medication if needed.  Take your other medications as prescribed.  Return to the emergency department if you have worsening rectal bleeding, chest pain, shortness of breath, lightheadedness or further concerns for your health. ?

## 2021-12-22 NOTE — ED Triage Notes (Signed)
Pt arrived via POV, c/o bright red blood in stools x2 days. And diffuse abd pain. Denies any vomiting. Hx of stomach CA. Currently under chemo tx every 2 weeks ?

## 2021-12-22 NOTE — ED Notes (Signed)
Patient transported to CT 

## 2021-12-22 NOTE — ED Provider Notes (Signed)
?[facility] ?Provider Note ? ? ?CSN: [id number] ?Arrival date & time: 12/22/21  1009 ? ?  ? ?History ? ?Chief Complaint  ?Patient presents with  ? Blood In Stools  ? ? ?Jimmy Blankenship is a 71 y.o. male. ? ?HPI ? ? 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  Patient states this morning he was having some slight indigestion.  He had a bowel movement that was a mixture of stool and dark blood clots.  He called his oncologist.  They recommended that he takes Pepto-Bismol and if he has a additional episode of bloody bowel movement to come to the ER for evaluation.  Patient states he had a second episode of bloody bowel movement, clots intertwined with stool.  Denies any other rectal bleeding outside of bowel movements.  States since taking the Pepto-Bismol that his epigastric discomfort has improved.  Denies any fever, vomiting, acute abdominal pain.  He states he otherwise been compliant with his medications including his Xarelto.  No history of gastric ulcers.  Did at one point have heme positive stools when he was being anticoagulated for an NSTEMI. ? ?Home Medications ?Prior to Admission medications   ?Medication Sig Start Date End Date Taking? Authorizing Provider  ?atorvastatin (LIPITOR) 80 MG tablet Take 1 tablet (80 mg total) by mouth daily. 04/15/20   Richardson Dopp T, PA-C  ?dexamethasone (DECADRON) 4 MG tablet Take 2 tablets (8 mg total) by mouth daily. Start the day after chemotherapy for 2 days. 02/01/21   Volanda Napoleon, MD  ?diphenoxylate-atropine (LOMOTIL) 2.5-0.025 MG tablet TAKE ONE TABLET BY MOUTH FOUR TIMES A DAY AS NEEDED FOR DIARRHEA OR LOOSE STOOLS 10/31/21   Volanda Napoleon, MD  ?feeding supplement (ENSURE ENLIVE / ENSURE PLUS) LIQD Take 237 mLs by mouth 2  (two) times daily between meals. 08/16/20   Regalado, Belkys A, MD  ?gabapentin (NEURONTIN) 300 MG capsule Take 1 capsule (300 mg total) by mouth 4 (four) times daily. 10/31/21   Volanda Napoleon, MD  ?glipiZIDE (GLUCOTROL) 5 MG tablet Take 5 mg by mouth 2 (two) times daily before a meal. 03/09/21   [provider]  ?lidocaine-prilocaine (EMLA) cream Apply to affected area once 02/01/21   Volanda Napoleon, MD  ?LORazepam (ATIVAN) 0.5 MG tablet Take 1 tablet (0.5 mg total) by mouth every 6 (six) hours as needed (Nausea or vomiting). 02/01/21   Volanda Napoleon, MD  ?megestrol (MEGACE) 400 MG/10ML suspension Take 10 mLs (400 mg total) by mouth 2 (two) times daily. 02/21/21   Volanda Napoleon, MD  ?melatonin 5 MG TABS Take 5 mg by mouth at bedtime as needed.    [provider]  ?metFORMIN (GLUCOPHAGE) 1000 MG tablet Take 1 tablet by mouth 2 (two) times daily with a meal. 05/18/21   [provider]  ?metoprolol tartrate (LOPRESSOR) 50 MG tablet TAKE ONE TABLET BY MOUTH TWICE A DAY 11/16/20   Nahser, Wonda Cheng, MD  ?ondansetron (ZOFRAN) 8 MG tablet Take 1 tablet (8 mg total) by mouth 2 (two) times daily as needed for refractory nausea / vomiting. Start on day 3 after chemo. 02/01/21   Volanda Napoleon, MD  ?pantoprazole (PROTONIX) 40 MG tablet Take 1 tablet (40 mg total) by mouth 2 (two) times daily. 11/15/20   Burney Gauze  R, MD  ?potassium chloride SA (KLOR-CON M) 20 MEQ tablet Take 1 tablet (20 mEq total) by mouth 2 (two) times daily. 12/14/21   Volanda Napoleon, MD  ?prochlorperazine (COMPAZINE) 10 MG tablet Take 1 tablet (10 mg total) by mouth every 6 (six) hours as needed (Nausea or vomiting). 02/01/21   Volanda Napoleon, MD  ?tamsulosin (FLOMAX) 0.4 MG CAPS capsule Take 0.4 mg by mouth at bedtime. 08/07/21   [provider]  ?Alveda Reasons 10 MG TABS tablet TAKE ONE TABLET BY MOUTH DAILY WITH SUPPER 08/07/21   Celso Amy, NP  ?   ? ?Allergies    ?Patient has no known allergies.   ? ?Review of  Systems   ?Review of Systems  ?Constitutional:  Negative for fever.  ?Respiratory:  Negative for shortness of breath.   ?Cardiovascular:  Negative for chest pain, palpitations and leg swelling.  ?Gastrointestinal:  Positive for abdominal pain, blood in stool and nausea. Negative for abdominal distention, anal bleeding, constipation, diarrhea and vomiting.  ?Skin:  Negative for rash.  ?Neurological:  Negative for syncope, light-headedness and headaches.  ? ?Physical Exam ?Updated Vital Signs ?BP 102/62   Pulse 92   Temp 98.5 ?F (36.9 ?C) (Oral)   Resp 18   SpO2 100%  ?Physical Exam ?Vitals and nursing note reviewed.  ?Constitutional:   ?   General: He is not in acute distress. ?   Appearance: Normal appearance.  ?   Comments: Thin and chronically ill appearing  ?HENT:  ?   Head: Normocephalic.  ?   Mouth/Throat:  ?   Mouth: Mucous membranes are moist.  ?Cardiovascular:  ?   Rate and Rhythm: Normal rate.  ?Pulmonary:  ?   Effort: Pulmonary effort is normal. No respiratory distress.  ?Abdominal:  ?   General: There is no distension.  ?   Palpations: Abdomen is soft.  ?   Tenderness: There is no abdominal tenderness. There is no guarding or rebound.  ?Genitourinary: ?   Comments: No active bleeding ?Skin: ?   General: Skin is warm.  ?Neurological:  ?   Mental Status: He is alert and oriented to person, place, and time. Mental status is at baseline.  ?Psychiatric:     ?   Mood and Affect: Mood normal.  ? ? ?ED Results / Procedures / Treatments   ?Labs ?(all labs ordered are listed, but only abnormal results are displayed) ?Labs Reviewed  ?COMPREHENSIVE METABOLIC PANEL - Abnormal; Notable for the following components:  ?    Result Value  ? CO2 19 (*)   ? Glucose, Bld 203 (*)   ? Total Protein 5.9 (*)   ? Albumin 2.8 (*)   ? Alkaline Phosphatase 166 (*)   ? All other components within normal limits  ?CBC - Abnormal; Notable for the following components:  ? WBC 3.8 (*)   ? RBC 2.98 (*)   ? Hemoglobin 10.1 (*)   ? HCT  30.4 (*)   ? MCV 102.0 (*)   ? RDW 18.0 (*)   ? Platelets 117 (*)   ? All other components within normal limits  ?POC OCCULT BLOOD, ED  ?TYPE AND SCREEN  ? ? ?EKG ?None ? ?Radiology ?No results found. ? ?Procedures ?Procedures  ? ? ?Medications Ordered in ED ?Medications  ?iohexol (OMNIPAQUE) 300 MG/ML solution 100 mL (has no administration in time range)  ? ? ?ED Course/ Medical Decision Making/ A&P ?  ?                        ?  Medical Decision Making ?Amount and/or Complexity of Data Reviewed ?Labs: ordered. ?Radiology: ordered. ? ?Risk ?Prescription drug management. ? ? ?72 year old male presents to the emergency department with 2 episodes of what appears to be melena.  No other ongoing rectal bleeding.  Vitals are stable on arrival.  He is anticoagulated on Xarelto. ? ?Blood work is reassuring.  Slightly hemoconcentrated but no acute anemia, no other acute changes.  No signs of ongoing rectal bleeding.  CT of the abdomen and pelvis is stable from a structural standpoint.  Patient's indigestion has resolved.  No history of gastric ulcers. ? ?Spoke with Dr. Marin Olp who recommends we discontinue the Xarelto and he will see him in the office and see if this needs to be restarted.  Educated the patient.  [name] interpreter used for all conversations.  They understand to discontinue the Xarelto and follow-up in the office with Dr. Marin Olp.  They offer no new concerns or questions.  No findings of acute blood loss/ongoing bleeding or hemodynamic instability.  He has been able to eat and drink, otherwise baseline appearing.  Patient at this time appears safe and stable for discharge and close outpatient follow up. Discharge plan and strict return to ED precautions discussed, patient verbalizes understanding and agreement. ? ? ? ? ? ? ? ?Final Clinical Impression(s) / ED Diagnoses ?Final diagnoses:  ?None  ? ? ?Rx / DC Orders ?ED Discharge Orders   ? ? None  ? ?  ? ? ?  ?Lorelle Gibbs, DO ?12/22/21 1711 ? ?

## 2021-12-26 ENCOUNTER — Other Ambulatory Visit: Payer: Self-pay

## 2021-12-26 ENCOUNTER — Inpatient Hospital Stay: Payer: Medicare Other

## 2021-12-26 ENCOUNTER — Encounter: Payer: Self-pay | Admitting: Hematology & Oncology

## 2021-12-26 ENCOUNTER — Other Ambulatory Visit: Payer: Self-pay | Admitting: Family

## 2021-12-26 DIAGNOSIS — C169 Malignant neoplasm of stomach, unspecified: Secondary | ICD-10-CM

## 2021-12-26 DIAGNOSIS — K922 Gastrointestinal hemorrhage, unspecified: Secondary | ICD-10-CM

## 2021-12-26 DIAGNOSIS — Z5112 Encounter for antineoplastic immunotherapy: Secondary | ICD-10-CM | POA: Diagnosis not present

## 2021-12-26 DIAGNOSIS — D649 Anemia, unspecified: Secondary | ICD-10-CM

## 2021-12-26 DIAGNOSIS — D500 Iron deficiency anemia secondary to blood loss (chronic): Secondary | ICD-10-CM

## 2021-12-26 LAB — CBC WITH DIFFERENTIAL (CANCER CENTER ONLY)
Abs Immature Granulocytes: 0.01 10*3/uL (ref 0.00–0.07)
Basophils Absolute: 0 10*3/uL (ref 0.0–0.1)
Basophils Relative: 1 %
Eosinophils Absolute: 0.1 10*3/uL (ref 0.0–0.5)
Eosinophils Relative: 3 %
HCT: 25.3 % — ABNORMAL LOW (ref 39.0–52.0)
Hemoglobin: 8.5 g/dL — ABNORMAL LOW (ref 13.0–17.0)
Immature Granulocytes: 0 %
Lymphocytes Relative: 20 %
Lymphs Abs: 0.5 10*3/uL — ABNORMAL LOW (ref 0.7–4.0)
MCH: 33.5 pg (ref 26.0–34.0)
MCHC: 33.6 g/dL (ref 30.0–36.0)
MCV: 99.6 fL (ref 80.0–100.0)
Monocytes Absolute: 0.3 10*3/uL (ref 0.1–1.0)
Monocytes Relative: 10 %
Neutro Abs: 1.7 10*3/uL (ref 1.7–7.7)
Neutrophils Relative %: 66 %
Platelet Count: 153 10*3/uL (ref 150–400)
RBC: 2.54 MIL/uL — ABNORMAL LOW (ref 4.22–5.81)
RDW: 17.9 % — ABNORMAL HIGH (ref 11.5–15.5)
WBC Count: 2.6 10*3/uL — ABNORMAL LOW (ref 4.0–10.5)
nRBC: 0.8 % — ABNORMAL HIGH (ref 0.0–0.2)

## 2021-12-26 LAB — SAMPLE TO BLOOD BANK

## 2021-12-26 LAB — PREPARE RBC (CROSSMATCH)

## 2021-12-26 MED ORDER — SODIUM CHLORIDE 0.9 % IV SOLN
200.0000 mg | Freq: Once | INTRAVENOUS | Status: AC
  Administered 2021-12-26: 200 mg via INTRAVENOUS
  Filled 2021-12-26: qty 10

## 2021-12-26 MED ORDER — SODIUM CHLORIDE 0.9% FLUSH
10.0000 mL | Freq: Once | INTRAVENOUS | Status: AC
  Administered 2021-12-26: 10 mL via INTRAVENOUS

## 2021-12-26 MED ORDER — SODIUM CHLORIDE 0.9 % IV SOLN: Freq: Once | INTRAVENOUS | Status: AC

## 2021-12-26 MED ORDER — HEPARIN SOD (PORK) LOCK FLUSH 100 UNIT/ML IV SOLN
500.0000 [IU] | Freq: Once | INTRAVENOUS | Status: AC
  Administered 2021-12-26: 500 [IU] via INTRAVENOUS

## 2021-12-26 NOTE — Progress Notes (Signed)
Pt.'s daughter Judson Roch notified that pt will receive 2 units of blood tomorrow per order of S. Eulas Post NP and to arrive at office tomorrow, 12/27/21 at 0830.  Teach back done.  ?

## 2021-12-27 ENCOUNTER — Telehealth: Payer: Self-pay

## 2021-12-27 ENCOUNTER — Inpatient Hospital Stay: Payer: Medicare Other

## 2021-12-27 ENCOUNTER — Encounter: Payer: Self-pay | Admitting: *Deleted

## 2021-12-27 DIAGNOSIS — D649 Anemia, unspecified: Secondary | ICD-10-CM

## 2021-12-27 DIAGNOSIS — C169 Malignant neoplasm of stomach, unspecified: Secondary | ICD-10-CM

## 2021-12-27 DIAGNOSIS — K922 Gastrointestinal hemorrhage, unspecified: Secondary | ICD-10-CM

## 2021-12-27 DIAGNOSIS — Z5112 Encounter for antineoplastic immunotherapy: Secondary | ICD-10-CM | POA: Diagnosis not present

## 2021-12-27 MED ORDER — ACETAMINOPHEN 325 MG PO TABS
650.0000 mg | ORAL_TABLET | Freq: Once | ORAL | Status: AC
  Administered 2021-12-27: 650 mg via ORAL
  Filled 2021-12-27: qty 2

## 2021-12-27 MED ORDER — DIPHENHYDRAMINE HCL 25 MG PO CAPS
25.0000 mg | ORAL_CAPSULE | Freq: Once | ORAL | Status: AC
  Administered 2021-12-27: 25 mg via ORAL
  Filled 2021-12-27: qty 1

## 2021-12-27 NOTE — Patient Instructions (Signed)
Blood Transfusion, Adult A blood transfusion is a procedure in which you receive blood or a type of blood cell (blood component) through an IV. You may need a blood transfusion when your blood level is low. This may result from a bleeding disorder, illness, injury, or surgery. The blood may come from a donor. You may also be able to donate blood for yourself (autologous blood donation) before a planned surgery. The blood given in a transfusion is made up of different blood components. You may receive: Red blood cells. These carry oxygen to the cells in the body. Platelets. These help your blood to clot. Plasma. This is the liquid part of your blood. It carries proteins and other substances throughout the body. White blood cells. These help you fight infections. If you have hemophilia or another clotting disorder, you may also receive other types of blood products. Tell a health care provider about: Any blood disorders you have. Any previous reactions you have had during a blood transfusion. Any allergies you have. All medicines you are taking, including vitamins, herbs, eye drops, creams, and over-the-counter medicines. Any surgeries you have had. Any medical conditions you have, including any recent fever or cold symptoms. Whether you are pregnant or may be pregnant. What are the risks? Generally, this is a safe procedure. However, problems may occur. The most common problems include: A mild allergic reaction, such as red, swollen areas of skin (hives) and itching. Fever or chills. This may be the body's response to new blood cells received. This may occur during or up to 4 hours after the transfusion. More serious problems may include: Transfusion-associated circulatory overload (TACO), or too much fluid in the lungs. This may cause breathing problems. A serious allergic reaction, such as difficulty breathing or swelling around the face and lips. Transfusion-related acute lung injury  (TRALI), which causes breathing difficulty and low oxygen in the blood. This can occur within hours of the transfusion or several days later. Iron overload. This can happen after receiving many blood transfusions over a period of time. Infection or virus being transmitted. This is rare because donated blood is carefully tested before it is given. Hemolytic transfusion reaction. This is rare. It happens when your body's defense system (immune system)tries to attack the new blood cells. Symptoms may include fever, chills, nausea, low blood pressure, and low back or chest pain. Transfusion-associated graft-versus-host disease (TAGVHD). This is rare. It happens when donated cells attack your body's healthy tissues. What happens before the procedure? Medicines Ask your health care provider about: Changing or stopping your regular medicines. This is especially important if you are taking diabetes medicines or blood thinners. Taking medicines such as aspirin and ibuprofen. These medicines can thin your blood. Do not take these medicines unless your health care provider tells you to take them. Taking over-the-counter medicines, vitamins, herbs, and supplements. General instructions Follow instructions from your health care provider about eating and drinking restrictions. You will have a blood test to determine your blood type. This is necessary to know what kind of blood your body will accept and to match it to the donor blood. If you are going to have a planned surgery, you may be able to do an autologous blood donation. This may be done in case you need to have a transfusion. You will have your temperature, blood pressure, and pulse monitored before the transfusion. If you have had an allergic reaction to a transfusion in the past, you may be given medicine to help prevent   a reaction. This medicine may be given to you by mouth (orally) or through an IV. Set aside time for the blood transfusion. This  procedure generally takes 1-4 hours to complete. What happens during the procedure?  An IV will be inserted into one of your veins. The bag of donated blood will be attached to your IV. The blood will then enter through your vein. Your temperature, blood pressure, and pulse will be monitored regularly during the transfusion. This monitoring is done to detect early signs of a transfusion reaction. Tell your nurse right away if you have any of these symptoms during the transfusion: Shortness of breath or trouble breathing. Chest or back pain. Fever or chills. Hives or itching. If you have any signs or symptoms of a reaction, your transfusion will be stopped and you may be given medicine. When the transfusion is complete, your IV will be removed. Pressure may be applied to the IV site for a few minutes. A bandage (dressing)will be applied. The procedure may vary among health care providers and hospitals. What happens after the procedure? Your temperature, blood pressure, pulse, breathing rate, and blood oxygen level will be monitored until you leave the hospital or clinic. Your blood may be tested to see how you are responding to the transfusion. You may be warmed with fluids or blankets to maintain a normal body temperature. If you receive your blood transfusion in an outpatient setting, you will be told whom to contact to report any reactions. Where to find more information For more information on blood transfusions, visit the American Red Cross: redcross.org Summary A blood transfusion is a procedure in which you receive blood or a type of blood cell (blood component) through an IV. The blood you receive may come from a donor or be donated by yourself (autologous blood donation) before a planned surgery. The blood given in a transfusion is made up of different blood components. You may receive red blood cells, platelets, plasma, or white blood cells depending on the condition treated. Your  temperature, blood pressure, and pulse will be monitored before, during, and after the transfusion. After the transfusion, your blood may be tested to see how your body has responded. This information is not intended to replace advice given to you by your health care provider. Make sure you discuss any questions you have with your health care provider. Document Revised: 07/02/2019 Document Reviewed: 02/19/2019 Elsevier Patient Education  2023 Elsevier Inc.  

## 2021-12-27 NOTE — Telephone Encounter (Signed)
Called the patient. Appointment made with patient's daughter to see Dr. Bryan Lemma on 12/28/21 at 10:00 am. ?

## 2021-12-27 NOTE — Telephone Encounter (Signed)
-----   Message from Lavena Bullion, DO sent at 12/27/2021 12:16 PM EDT ----- ?Regarding: FW: Gastric CA patient ?Can you please call this patient to schedule expedited appointment with me.  See if he can come in tomorrow at 10:00. Thanks. ? ?----- Message ----- ?From: Jackquline Denmark, MD ?Sent: 12/27/2021  12:11 PM EDT ?To: Lavena Bullion, DO ?Subject: RE: Gastric CA patient                        ? ?Hi Vito, ? ?I am going to [place] for next 2 weeks. ?I am purple Doc this week.  ?Can you please see him in your clinic. ?Happy to help if he needs EGD at [facility] this week ? ?Merrie Roof ? ?----- Message ----- ?From: Lavena Bullion, DO ?Sent: 12/27/2021  11:51 AM EDT ?To: Ladene Artist, MD, Jackquline Denmark, MD ?Subject: Gastric CA patient                            ? ?This is a 71 year old male who was diagnosed with metastatic gastric cancer during inpatient in 2021.  Has not followed up with [place] since then.  Dr. Marin Olp contacted me today as the patient has been having melena and anemia.  He transfused him today, but asking for expedited follow-up and EGD.  I believe he is seen each of you during inpatient course in 2021, but not since then. I am happy to help out in expediting his care.  Let me know if you want me to try to get him in quickly over here in the [facility] office.  Currently on Xarelto; Hematology holding. ? ?Thanks. ? ?Vito ? ? ? ? ?

## 2021-12-28 ENCOUNTER — Other Ambulatory Visit: Payer: Self-pay

## 2021-12-28 ENCOUNTER — Ambulatory Visit (INDEPENDENT_AMBULATORY_CARE_PROVIDER_SITE_OTHER): Payer: Medicare Other | Admitting: Gastroenterology

## 2021-12-28 ENCOUNTER — Encounter: Payer: Self-pay | Admitting: Gastroenterology

## 2021-12-28 ENCOUNTER — Encounter (HOSPITAL_COMMUNITY): Payer: Self-pay

## 2021-12-28 ENCOUNTER — Telehealth: Payer: Self-pay

## 2021-12-28 ENCOUNTER — Observation Stay (HOSPITAL_COMMUNITY)
Admission: EM | Admit: 2021-12-28 | Discharge: 2021-12-30 | Disposition: A | Discharge status: Home / Self Care | Payer: Medicare Other | Attending: Family Medicine | Admitting: Family Medicine

## 2021-12-28 VITALS — BP 90/70 | HR 98 | Ht 68.9 in | Wt 141.0 lb

## 2021-12-28 DIAGNOSIS — K625 Hemorrhage of anus and rectum: Secondary | ICD-10-CM

## 2021-12-28 DIAGNOSIS — K3189 Other diseases of stomach and duodenum: Secondary | ICD-10-CM | POA: Diagnosis not present

## 2021-12-28 DIAGNOSIS — Z951 Presence of aortocoronary bypass graft: Secondary | ICD-10-CM | POA: Insufficient documentation

## 2021-12-28 DIAGNOSIS — K921 Melena: Secondary | ICD-10-CM

## 2021-12-28 DIAGNOSIS — I5042 Chronic combined systolic (congestive) and diastolic (congestive) heart failure: Secondary | ICD-10-CM | POA: Insufficient documentation

## 2021-12-28 DIAGNOSIS — K317 Polyp of stomach and duodenum: Secondary | ICD-10-CM | POA: Insufficient documentation

## 2021-12-28 DIAGNOSIS — E1169 Type 2 diabetes mellitus with other specified complication: Secondary | ICD-10-CM | POA: Diagnosis present

## 2021-12-28 DIAGNOSIS — Z87891 Personal history of nicotine dependence: Secondary | ICD-10-CM | POA: Diagnosis not present

## 2021-12-28 DIAGNOSIS — Z20822 Contact with and (suspected) exposure to covid-19: Secondary | ICD-10-CM | POA: Insufficient documentation

## 2021-12-28 DIAGNOSIS — D649 Anemia, unspecified: Secondary | ICD-10-CM

## 2021-12-28 DIAGNOSIS — I130 Hypertensive heart and chronic kidney disease with heart failure and stage 1 through stage 4 chronic kidney disease, or unspecified chronic kidney disease: Secondary | ICD-10-CM | POA: Diagnosis not present

## 2021-12-28 DIAGNOSIS — Z7901 Long term (current) use of anticoagulants: Secondary | ICD-10-CM | POA: Diagnosis not present

## 2021-12-28 DIAGNOSIS — E1122 Type 2 diabetes mellitus with diabetic chronic kidney disease: Secondary | ICD-10-CM | POA: Insufficient documentation

## 2021-12-28 DIAGNOSIS — K2981 Duodenitis with bleeding: Secondary | ICD-10-CM | POA: Diagnosis not present

## 2021-12-28 DIAGNOSIS — Z7984 Long term (current) use of oral hypoglycemic drugs: Secondary | ICD-10-CM | POA: Diagnosis not present

## 2021-12-28 DIAGNOSIS — I513 Intracardiac thrombosis, not elsewhere classified: Secondary | ICD-10-CM | POA: Diagnosis present

## 2021-12-28 DIAGNOSIS — Z79899 Other long term (current) drug therapy: Secondary | ICD-10-CM | POA: Insufficient documentation

## 2021-12-28 DIAGNOSIS — K922 Gastrointestinal hemorrhage, unspecified: Secondary | ICD-10-CM | POA: Diagnosis present

## 2021-12-28 DIAGNOSIS — I2510 Atherosclerotic heart disease of native coronary artery without angina pectoris: Secondary | ICD-10-CM | POA: Insufficient documentation

## 2021-12-28 DIAGNOSIS — Z955 Presence of coronary angioplasty implant and graft: Secondary | ICD-10-CM | POA: Insufficient documentation

## 2021-12-28 DIAGNOSIS — E782 Mixed hyperlipidemia: Secondary | ICD-10-CM | POA: Insufficient documentation

## 2021-12-28 DIAGNOSIS — D500 Iron deficiency anemia secondary to blood loss (chronic): Secondary | ICD-10-CM | POA: Insufficient documentation

## 2021-12-28 DIAGNOSIS — I509 Heart failure, unspecified: Secondary | ICD-10-CM

## 2021-12-28 DIAGNOSIS — N400 Enlarged prostate without lower urinary tract symptoms: Secondary | ICD-10-CM | POA: Insufficient documentation

## 2021-12-28 DIAGNOSIS — I10 Essential (primary) hypertension: Secondary | ICD-10-CM | POA: Diagnosis present

## 2021-12-28 DIAGNOSIS — D509 Iron deficiency anemia, unspecified: Secondary | ICD-10-CM | POA: Diagnosis not present

## 2021-12-28 DIAGNOSIS — N189 Chronic kidney disease, unspecified: Secondary | ICD-10-CM | POA: Diagnosis not present

## 2021-12-28 DIAGNOSIS — E119 Type 2 diabetes mellitus without complications: Secondary | ICD-10-CM

## 2021-12-28 DIAGNOSIS — C169 Malignant neoplasm of stomach, unspecified: Secondary | ICD-10-CM

## 2021-12-28 DIAGNOSIS — E1142 Type 2 diabetes mellitus with diabetic polyneuropathy: Secondary | ICD-10-CM | POA: Insufficient documentation

## 2021-12-28 DIAGNOSIS — C165 Malignant neoplasm of lesser curvature of stomach, unspecified: Secondary | ICD-10-CM | POA: Insufficient documentation

## 2021-12-28 DIAGNOSIS — Z794 Long term (current) use of insulin: Secondary | ICD-10-CM | POA: Insufficient documentation

## 2021-12-28 LAB — CBC WITH DIFFERENTIAL/PLATELET
Abs Immature Granulocytes: 0.02 10*3/uL (ref 0.00–0.07)
Basophils Absolute: 0 10*3/uL (ref 0.0–0.1)
Basophils Relative: 1 %
Eosinophils Absolute: 0.1 10*3/uL (ref 0.0–0.5)
Eosinophils Relative: 2 %
HCT: 33.5 % — ABNORMAL LOW (ref 39.0–52.0)
Hemoglobin: 11.4 g/dL — ABNORMAL LOW (ref 13.0–17.0)
Immature Granulocytes: 1 %
Lymphocytes Relative: 25 %
Lymphs Abs: 0.7 10*3/uL (ref 0.7–4.0)
MCH: 33.1 pg (ref 26.0–34.0)
MCHC: 34 g/dL (ref 30.0–36.0)
MCV: 97.4 fL (ref 80.0–100.0)
Monocytes Absolute: 0.5 10*3/uL (ref 0.1–1.0)
Monocytes Relative: 18 %
Neutro Abs: 1.5 10*3/uL — ABNORMAL LOW (ref 1.7–7.7)
Neutrophils Relative %: 53 %
Platelets: 163 10*3/uL (ref 150–400)
RBC: 3.44 MIL/uL — ABNORMAL LOW (ref 4.22–5.81)
RDW: 18.9 % — ABNORMAL HIGH (ref 11.5–15.5)
WBC: 2.8 10*3/uL — ABNORMAL LOW (ref 4.0–10.5)
nRBC: 0 % (ref 0.0–0.2)

## 2021-12-28 LAB — COMPREHENSIVE METABOLIC PANEL
ALT: 45 U/L — ABNORMAL HIGH (ref 0–44)
AST: 43 U/L — ABNORMAL HIGH (ref 15–41)
Albumin: 3.1 g/dL — ABNORMAL LOW (ref 3.5–5.0)
Alkaline Phosphatase: 196 U/L — ABNORMAL HIGH (ref 38–126)
Anion gap: 7 (ref 5–15)
BUN: 11 mg/dL (ref 8–23)
CO2: 18 mmol/L — ABNORMAL LOW (ref 22–32)
Calcium: 8.7 mg/dL — ABNORMAL LOW (ref 8.9–10.3)
Chloride: 111 mmol/L (ref 98–111)
Creatinine, Ser: 0.9 mg/dL (ref 0.61–1.24)
GFR, Estimated: >60 mL/min (ref >60)
Glucose, Bld: 107 mg/dL — ABNORMAL HIGH (ref 70–99)
Potassium: 3.9 mmol/L (ref 3.5–5.1)
Sodium: 136 mmol/L (ref 135–145)
Total Bilirubin: 1.6 mg/dL — ABNORMAL HIGH (ref 0.3–1.2)
Total Protein: 6.1 g/dL — ABNORMAL LOW (ref 6.5–8.1)

## 2021-12-28 LAB — TYPE AND SCREEN
ABO/RH(D): A POS
ABO/RH(D): A POS
Antibody Screen: NEGATIVE
Antibody Screen: NEGATIVE
Unit division: 0
Unit division: 0

## 2021-12-28 LAB — RESP PANEL BY RT-PCR (FLU A&B, COVID) ARPGX2
Influenza A by PCR: NEGATIVE
Influenza B by PCR: NEGATIVE
SARS Coronavirus 2 by RT PCR: NEGATIVE

## 2021-12-28 LAB — BPAM RBC
Blood Product Expiration Date: 202305022359
Blood Product Expiration Date: 202305022359
ISSUE DATE / TIME: 202304190734
ISSUE DATE / TIME: 202304190734
Unit Type and Rh: 6200
Unit Type and Rh: 6200

## 2021-12-28 LAB — POC OCCULT BLOOD, ED: Fecal Occult Bld: POSITIVE — AB

## 2021-12-28 MED ORDER — SODIUM CHLORIDE 0.9 % IV BOLUS
1000.0000 mL | Freq: Once | INTRAVENOUS | Status: AC
Start: 2021-12-28 — End: 2021-12-29
  Administered 2021-12-28: 1000 mL via INTRAVENOUS

## 2021-12-28 MED ORDER — ACETAMINOPHEN 325 MG PO TABS: 650.0000 mg | ORAL_TABLET | Freq: Four times a day (QID) | ORAL | Status: DC | PRN

## 2021-12-28 MED ORDER — TAMSULOSIN HCL 0.4 MG PO CAPS
0.4000 mg | ORAL_CAPSULE | Freq: Every day | ORAL | Status: DC
  Administered 2021-12-29 (×2): 0.4 mg via ORAL
  Filled 2021-12-28 (×2): qty 1

## 2021-12-28 MED ORDER — POLYETHYLENE GLYCOL 3350 17 G PO PACK
17.0000 g | PACK | Freq: Every day | ORAL | Status: DC | PRN
  Filled 2021-12-28: qty 1

## 2021-12-28 MED ORDER — GABAPENTIN 300 MG PO CAPS
300.0000 mg | ORAL_CAPSULE | Freq: Three times a day (TID) | ORAL | Status: DC
  Administered 2021-12-29 – 2021-12-30 (×4): 300 mg via ORAL
  Filled 2021-12-28 (×4): qty 1

## 2021-12-28 MED ORDER — ACETAMINOPHEN 650 MG RE SUPP
650.0000 mg | Freq: Four times a day (QID) | RECTAL | Status: DC | PRN
Start: 2021-12-28 — End: 2021-12-30

## 2021-12-28 MED ORDER — INSULIN ASPART 100 UNIT/ML IJ SOLN
0.0000 [IU] | Freq: Three times a day (TID) | INTRAMUSCULAR | Status: DC
  Administered 2021-12-29: 2 [IU] via SUBCUTANEOUS
  Administered 2021-12-29 – 2021-12-30 (×2): 3 [IU] via SUBCUTANEOUS
  Filled 2021-12-28: qty 0.15

## 2021-12-28 MED ORDER — METOPROLOL TARTRATE 50 MG PO TABS
50.0000 mg | ORAL_TABLET | Freq: Two times a day (BID) | ORAL | Status: DC
  Administered 2021-12-29 – 2021-12-30 (×3): 50 mg via ORAL
  Filled 2021-12-28: qty 1
  Filled 2021-12-28: qty 2
  Filled 2021-12-28: qty 1

## 2021-12-28 MED ORDER — ONDANSETRON HCL 4 MG PO TABS: 4.0000 mg | ORAL_TABLET | Freq: Four times a day (QID) | ORAL | Status: DC | PRN

## 2021-12-28 MED ORDER — PANTOPRAZOLE SODIUM 40 MG IV SOLR
40.0000 mg | Freq: Two times a day (BID) | INTRAVENOUS | Status: DC
  Administered 2021-12-29 – 2021-12-30 (×3): 40 mg via INTRAVENOUS
  Filled 2021-12-28 (×3): qty 10

## 2021-12-28 MED ORDER — ENSURE ENLIVE PO LIQD
237.0000 mL | Freq: Two times a day (BID) | ORAL | Status: DC
  Filled 2021-12-28 (×2): qty 237

## 2021-12-28 MED ORDER — PANTOPRAZOLE 80MG IVPB - SIMPLE MED
80.0000 mg | Freq: Once | INTRAVENOUS | Status: AC
  Administered 2021-12-28: 80 mg via INTRAVENOUS
  Filled 2021-12-28: qty 80

## 2021-12-28 MED ORDER — ONDANSETRON HCL 4 MG/2ML IJ SOLN
4.0000 mg | Freq: Four times a day (QID) | INTRAMUSCULAR | Status: DC | PRN
Start: 2021-12-28 — End: 2021-12-30

## 2021-12-28 MED ORDER — HYDRALAZINE HCL 20 MG/ML IJ SOLN
10.0000 mg | Freq: Four times a day (QID) | INTRAMUSCULAR | Status: DC | PRN
Start: 2021-12-28 — End: 2021-12-29

## 2021-12-28 MED ORDER — DIPHENOXYLATE-ATROPINE 2.5-0.025 MG PO TABS
1.0000 | ORAL_TABLET | Freq: Four times a day (QID) | ORAL | Status: DC | PRN
  Administered 2021-12-29: 1 via ORAL
  Filled 2021-12-28: qty 1

## 2021-12-28 MED ORDER — ATORVASTATIN CALCIUM 40 MG PO TABS
80.0000 mg | ORAL_TABLET | Freq: Every day | ORAL | Status: DC
Start: 2021-12-29 — End: 2021-12-30
  Administered 2021-12-30: 80 mg via ORAL
  Filled 2021-12-28: qty 2

## 2021-12-28 NOTE — Assessment & Plan Note (Addendum)
Patient presenting with several week history of melena, bright red blood per rectum and generalized weakness ?Now s/p EGD, nodular mucosa in gastric body, in lesser curvature of the stomach and in the incisura.  Single duodenal polyp.  Biopsied. ?GI recommended following up path, PPI BID, sucralfate 1 g PO QID x2 weeks, recommending holding xarelto.  If bleeding in future, consider IR embolization. ?

## 2021-12-28 NOTE — ED Notes (Signed)
Three pink tubes with blood bank request form has been sent down to lab for later use if necessary.  ?

## 2021-12-28 NOTE — Assessment & Plan Note (Addendum)
Resume home meds ?A1c 7 ?

## 2021-12-28 NOTE — ED Provider Notes (Signed)
?[facility] DEPT ?Provider Note ? ? ?CSN: [id number] ?Arrival date & time: 12/28/21  1804 ? ?  ? ?History ? ?Chief Complaint  ?Patient presents with  ? Abnormal Lab  ? ? ?Jimmy Blankenship is a 71 y.o. male. ? ?Pt with hx gastric adenocarcinoma, presents with persistent dark/black stools, and generalized weakness. Recently transfused for same. Pt saw his gi doctor today and was told to come to ED for admission, plan for EGD and further management.  Pt denies abd pain or vomiting. Denies any other abnormal bruising or bleeding - stopped his anticoag therapy earlier this month. No fever or chills.  ? ?The history is provided by the patient, a relative and the EMS personnel. A language interpreter was used.  ?Abnormal Lab ? ?  ? ?Home Medications ?Prior to Admission medications   ?Medication Sig Start Date End Date Taking? Authorizing Provider  ?atorvastatin (LIPITOR) 80 MG tablet Take 1 tablet (80 mg total) by mouth daily. 04/15/20   Richardson Dopp T, PA-C  ?dexamethasone (DECADRON) 4 MG tablet Take 2 tablets (8 mg total) by mouth daily. Start the day after chemotherapy for 2 days. 02/01/21   Volanda Napoleon, MD  ?diphenoxylate-atropine (LOMOTIL) 2.5-0.025 MG tablet TAKE ONE TABLET BY MOUTH FOUR TIMES A DAY AS NEEDED FOR DIARRHEA OR LOOSE STOOLS 10/31/21   Volanda Napoleon, MD  ?feeding supplement (ENSURE ENLIVE / ENSURE PLUS) LIQD Take 237 mLs by mouth 2 (two) times daily between meals. 08/16/20   Regalado, Belkys A, MD  ?gabapentin (NEURONTIN) 300 MG capsule Take 1 capsule (300 mg total) by mouth 4 (four) times daily. 10/31/21   Volanda Napoleon, MD  ?glipiZIDE (GLUCOTROL) 5 MG tablet Take 5 mg by mouth 2 (two) times daily before a meal. 03/09/21   [provider]  ?lidocaine-prilocaine (EMLA) cream Apply to affected area once 02/01/21   Volanda Napoleon, MD  ?LORazepam (ATIVAN) 0.5 MG tablet Take 1 tablet (0.5 mg total) by mouth every 6 (six) hours as needed (Nausea or vomiting). 02/01/21    Volanda Napoleon, MD  ?megestrol (MEGACE) 400 MG/10ML suspension Take 10 mLs (400 mg total) by mouth 2 (two) times daily. 02/21/21   Volanda Napoleon, MD  ?melatonin 5 MG TABS Take 5 mg by mouth at bedtime as needed.    [provider]  ?metFORMIN (GLUCOPHAGE) 1000 MG tablet Take 1 tablet by mouth 2 (two) times daily with a meal. 05/18/21   [provider]  ?metoprolol tartrate (LOPRESSOR) 50 MG tablet TAKE ONE TABLET BY MOUTH TWICE A DAY 11/16/20   Nahser, Wonda Cheng, MD  ?ondansetron (ZOFRAN) 8 MG tablet Take 1 tablet (8 mg total) by mouth 2 (two) times daily as needed for refractory nausea / vomiting. Start on day 3 after chemo. 02/01/21   Volanda Napoleon, MD  ?pantoprazole (PROTONIX) 40 MG tablet Take 1 tablet (40 mg total) by mouth 2 (two) times daily. 11/15/20   Volanda Napoleon, MD  ?potassium chloride SA (KLOR-CON M) 20 MEQ tablet Take 1 tablet (20 mEq total) by mouth 2 (two) times daily. 12/14/21   Volanda Napoleon, MD  ?prochlorperazine (COMPAZINE) 10 MG tablet Take 1 tablet (10 mg total) by mouth every 6 (six) hours as needed (Nausea or vomiting). 02/01/21   Volanda Napoleon, MD  ?tamsulosin (FLOMAX) 0.4 MG CAPS capsule Take 0.4 mg by mouth at bedtime. 08/07/21   [provider]  ?XARELTO 10 MG TABS tablet TAKE ONE TABLET BY  MOUTH DAILY WITH SUPPER ?Patient not taking: Reported on 12/28/2021 08/07/21   Celso Amy, NP  ?   ? ?Allergies    ?Patient has no known allergies.   ? ?Review of Systems   ?Review of Systems  ?Constitutional:  Negative for fever.  ?HENT:  Negative for sore throat.   ?Eyes:  Negative for redness.  ?Respiratory:  Negative for shortness of breath.   ?Cardiovascular:  Negative for chest pain.  ?Gastrointestinal:  Negative for abdominal pain and vomiting.  ?Genitourinary:  Negative for flank pain.  ?Musculoskeletal:  Negative for back pain.  ?Skin:  Negative for rash.  ?Neurological:  Positive for weakness and light-headedness. Negative for headaches.   ?Psychiatric/Behavioral:  Negative for confusion.   ? ?Physical Exam ?Updated Vital Signs ?BP 112/70   Pulse 86   Temp 98.4 ?F (36.9 ?C) (Oral)   Resp 18   Ht 1.753 m ('5\' 9"'$ )   Wt 64 kg   SpO2 99%   BMI 20.82 kg/m?  ?Physical Exam ?Vitals and nursing note reviewed.  ?Constitutional:   ?   Appearance: Normal appearance. He is well-developed.  ?HENT:  ?   Head: Atraumatic.  ?   Nose: Nose normal.  ?   Mouth/Throat:  ?   Mouth: Mucous membranes are moist.  ?Eyes:  ?   General: No scleral icterus. ?Neck:  ?   Trachea: No tracheal deviation.  ?Cardiovascular:  ?   Rate and Rhythm: Normal rate and regular rhythm.  ?   Pulses: Normal pulses.  ?   Heart sounds: Normal heart sounds. No murmur heard. ?  No friction rub. No gallop.  ?Pulmonary:  ?   Effort: Pulmonary effort is normal. No accessory muscle usage or respiratory distress.  ?   Breath sounds: Normal breath sounds.  ?Abdominal:  ?   General: Bowel sounds are normal. There is no distension.  ?   Palpations: Abdomen is soft.  ?   Tenderness: There is no abdominal tenderness.  ?Genitourinary: ?   Comments: Dark colored stool, streak brb, heme positive.  ?Musculoskeletal:     ?   General: No swelling.  ?   Cervical back: Neck supple.  ?Skin: ?   General: Skin is warm and dry.  ?   Findings: No rash.  ?Neurological:  ?   Mental Status: He is alert.  ?   Comments: Alert, speech clear.   ?Psychiatric:     ?   Mood and Affect: Mood normal.  ? ? ?ED Results / Procedures / Treatments   ?Labs ?(all labs ordered are listed, but only abnormal results are displayed) ?Results for orders placed or performed during the hospital encounter of 12/28/21  ?Comprehensive metabolic panel  ?Result Value Ref Range  ? Sodium 136 135 - 145 mmol/L  ? Potassium 3.9 3.5 - 5.1 mmol/L  ? Chloride 111 98 - 111 mmol/L  ? CO2 18 (L) 22 - 32 mmol/L  ? Glucose, Bld 107 (H) 70 - 99 mg/dL  ? BUN 11 8 - 23 mg/dL  ? Creatinine, Ser 0.90 0.61 - 1.24 mg/dL  ? Calcium 8.7 (L) 8.9 - 10.3 mg/dL  ? Total  Protein 6.1 (L) 6.5 - 8.1 g/dL  ? Albumin 3.1 (L) 3.5 - 5.0 g/dL  ? AST 43 (H) 15 - 41 U/L  ? ALT 45 (H) 0 - 44 U/L  ? Alkaline Phosphatase 196 (H) 38 - 126 U/L  ? Total Bilirubin 1.6 (H) 0.3 - 1.2 mg/dL  ? GFR,  Estimated >60 >60 mL/min  ? Anion gap 7 5 - 15  ?CBC with Differential  ?Result Value Ref Range  ? WBC 2.8 (L) 4.0 - 10.5 K/uL  ? RBC 3.44 (L) 4.22 - 5.81 MIL/uL  ? Hemoglobin 11.4 (L) 13.0 - 17.0 g/dL  ? HCT 33.5 (L) 39.0 - 52.0 %  ? MCV 97.4 80.0 - 100.0 fL  ? MCH 33.1 26.0 - 34.0 pg  ? MCHC 34.0 30.0 - 36.0 g/dL  ? RDW 18.9 (H) 11.5 - 15.5 %  ? Platelets 163 150 - 400 K/uL  ? nRBC 0.0 0.0 - 0.2 %  ? Neutrophils Relative % 53 %  ? Neutro Abs 1.5 (L) 1.7 - 7.7 K/uL  ? Lymphocytes Relative 25 %  ? Lymphs Abs 0.7 0.7 - 4.0 K/uL  ? Monocytes Relative 18 %  ? Monocytes Absolute 0.5 0.1 - 1.0 K/uL  ? Eosinophils Relative 2 %  ? Eosinophils Absolute 0.1 0.0 - 0.5 K/uL  ? Basophils Relative 1 %  ? Basophils Absolute 0.0 0.0 - 0.1 K/uL  ? Immature Granulocytes 1 %  ? Abs Immature Granulocytes 0.02 0.00 - 0.07 K/uL  ?POC occult blood, ED Provider will collect  ?Result Value Ref Range  ? Fecal Occult Bld POSITIVE (A) NEGATIVE  ? ?CT Abdomen Pelvis W Contrast ? ?Result Date: 12/22/2021 ?CLINICAL DATA:  Bright red blood in stools for 2 days, diffuse abdominal pain, history of gastric cancer currently undergoing chemotherapy EXAM: CT ABDOMEN AND PELVIS WITH CONTRAST TECHNIQUE: Multidetector CT imaging of the abdomen and pelvis was performed using the standard protocol following bolus administration of intravenous contrast. RADIATION DOSE REDUCTION: This exam was performed according to the departmental dose-optimization program which includes automated exposure control, adjustment of the mA and/or kV according to patient size and/or use of iterative reconstruction technique. CONTRAST:  107m OMNIPAQUE IOHEXOL 300 MG/ML  SOLN COMPARISON:  11/17/2021 FINDINGS: Lower chest: Scattered hypoventilatory changes and bibasilar  scarring again noted. No airspace disease, effusion, or pneumothorax. Hepatobiliary: Multiple stable liver hypodensities consistent with presumed hepatic metastases. 1.4 cm hypodensity right lobe liver image 31, 1.2 cm hypodensi

## 2021-12-28 NOTE — Telephone Encounter (Signed)
Spoke with the patient's daughter Judson Roch to take her father for direct admission to the hospital today for expedited EGD per Dr. Bryan Lemma. Stated she will take him by 6:30 pm today.  ?

## 2021-12-28 NOTE — Assessment & Plan Note (Addendum)
Monitoring patient on telemetry ?Currently holding all forms of anticoagulation ?Continuing home regimen of lipid lowering therapy and AV nodal blocking therapy ? ?

## 2021-12-28 NOTE — H&P (Signed)
?History and Physical  ? ? ?Patient: Jimmy Blankenship MRN: [id number] DOA: 12/28/2021 ? ?Date of Service: the patient was seen and examined on 12/28/2021 ? ?Patient coming from:  Gastroenterology Clinic ? ?Chief Complaint:  ?Chief Complaint  ?Patient presents with  ? Abnormal Lab  ? ? ?HPI:  ? 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 ? ?Patient presents to [facility] emergency department today at the direction of Dr. Bryan Lemma with gastroenterology due to ongoing melena and weakness.  ? ?Patient explains that since late March/early April the patient has been experiencing episodes of melena.  As patient's symptoms persisted patient is developed associated fatigue and generalized weakness with dyspnea on exertion as well as numbness and tingling of the extremities.  When patient was seen in oncology clinic on 4/5, hemoglobin was found to have dropped to 8.0 down from 9.3.  At that time the patient was administered a 2 unit packed red blood cell transfusion as well as an intravenous iron infusion.   ? ?Patient was then referred to Dr. Bryan Lemma with gastroenterology and has been having intermittent  bright red blood per rectum since, once prompting presentation to the emergency department 4/14 after which the patient was discharged and Xarelto was discontinued.  Patient then followed up with oncology on 4/18 and was found to have a hemoglobin of 8.5, receiving another transfusion of 2 units of packed red blood cells.  He continued to experience  daily episodes of melena thereafter according to family.  ? ?Upon evaluation by Dr. Bryan Lemma with gastroenterology clinic earlier today due to patient's ongoing severe generalized weakness the decision was made to send the patient to [facility] emergency department for further evaluation and likely upper endoscopy to be performed the morning of 4/21. ? ?Upon evaluation in the emergency department repeat CBC revealed a hemoglobin of 11.4.  There has been no evidence of active gross bleeding in the emergency department and patient was noted to be hemodynamically stable.  The hospitalist group has now been called to assess the patient for admission to the hospital. ? ? ? ?Review of Systems: Review of Systems  ?Constitutional:  Positive for malaise/fatigue.  ?Gastrointestinal:  Positive for blood in stool and melena.  ?Neurological:  Positive for weakness.  ?All other systems reviewed and are negative. ? ? ?Past Medical History:  ?Diagnosis Date  ? 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[place]) 08/22/2020  ? CKD (chronic kidney disease)   ? Coronary Artery Disease s/p CABG   ? hx of PCI in 2000s in [place] // s/p NSTEMI in 7/21 >> CABG  ? Diabetes mellitus 2   ? Goals of care, counseling/discussion 08/22/2020  ? Heart failure with reduced ejection fraction   ? Ischemic CM // Echo 7/21 - EF 35 // Intraop TEE 7/21: EF 40-45 // Echocardiogram 9/21: apical AK, EF 40-45, Gr 2 DD, trace AI, mild MR, mild LAE, normal RVSF, no pericardial effusion  ? High cholesterol   ? Hypertension   ? Iron deficiency anemia   ? Heme + stools (during admit for NSTEMI in 7/21)  ? 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  ?  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 ([place]) 08/22/2020  ? Palpitations   ? Event monitor 10/21: NSR, rare PVCs, rare 4 beats NSVT, rare brief non-sustained SVT  ? ? ?Past Surgical History:  ?Procedure Laterality Date  ? BIOPSY  08/15/2020  ? Procedure: BIOPSY;  Surgeon: Ladene Artist, MD;  Location: [facility] ENDOSCOPY;   Service: Gastroenterology;;  ? COLONOSCOPY    ? around 2013. Normal per patient. Elonda Husky  ? COLONOSCOPY N/A 08/15/2020  ? Procedure: COLONOSCOPY;  Surgeon: Ladene Artist, MD;  Location: [place];  Service: Gastroenterology;  Laterality: N/A;  ? CORONARY ANGIOPLASTY WITH STENT PLACEMENT    ? CORONARY ARTERY BYPASS GRAFT N/A 03/24/2020  ? Procedure: CORONARY ARTERY BYPASS GRAFTING (CABG), ON PUMP, TIMES THREE, USING LEFT INTERNAL MAMMARY ARTERY AND RIGHT ENDOSCOPICALLY HARVESTED GREATER SAPHENOUS VEIN;  Surgeon: Ivin Poot, MD;  Location: [place];  Service: Open Heart Surgery;  Laterality: N/A;  ? ESOPHAGOGASTRODUODENOSCOPY N/A 08/15/2020  ? Procedure: ESOPHAGOGASTRODUODENOSCOPY (EGD);  Surgeon: Ladene Artist, MD;  Location: [place];  Service: Gastroenterology;  Laterality: N/A;  ? IR IMAGING GUIDED PORT INSERTION  09/16/2020  ? LEFT HEART CATH AND CORONARY ANGIOGRAPHY N/A 03/17/2020  ? Procedure: LEFT HEART CATH AND CORONARY ANGIOGRAPHY;  Surgeon: Belva Crome, MD;  Location: [place] CV LAB;  Service: Cardiovascular;  Laterality: N/A;  ? POLYPECTOMY  08/15/2020  ? Procedure: POLYPECTOMY;  Surgeon: Ladene Artist, MD;  Location: [place];  Service: Gastroenterology;;  ? TEE WITHOUT CARDIOVERSION N/A 03/24/2020  ? Procedure: TRANSESOPHAGEAL ECHOCARDIOGRAM (TEE);  Surgeon: [name], Collier Salina, MD;  Location: [place];  Service: Open Heart Surgery;  Laterality: N/A;  ? ? ?Social History:  reports that he quit smoking about 17 years ago. His smoking use included cigarettes. He has a 30.00 pack-year smoking history. He has never used smokeless tobacco. He reports that he does not drink alcohol and does not use drugs. ? ?No Known Allergies ? ?Family History  ?Problem Relation Age of Onset  ? Atrial fibrillation Mother   ? Congestive Heart Failure Mother   ? Cancer Neg Hx   ? ? ?Prior to Admission medications   ?Medication Sig Start Date End Date Taking? Authorizing Provider  ?atorvastatin (LIPITOR) 40 MG  tablet Take 80 mg by mouth daily.   Yes [provider]  ?glipiZIDE (GLUCOTROL) 5 MG tablet Take 5 mg by mouth 2 (two) times daily before a meal. 03/09/21  Yes [provider]  ?metFORMIN (GLUCOPHAGE) 1000 MG tablet Take 1,000 mg by mouth 2 (two) times daily with a meal. 05/18/21  Yes [provider]  ?metoprolol tartrate (LOPRESSOR) 50 MG tablet TAKE ONE TABLET BY MOUTH TWICE A DAY ?Patient taking differently: Take 50 mg by mouth 2 (two) times daily. 11/16/20  Yes Nahser, Wonda Cheng, MD  ?ondansetron (ZOFRAN) 8 MG tablet Take 1 tablet (8 mg total) by mouth 2 (two) times daily as needed for refractory nausea / vomiting. Start on day 3 after chemo. 02/01/21  Yes Volanda Napoleon, MD  ?prochlorperazine (COMPAZINE) 10 MG tablet Take 1 tablet (10 mg total) by mouth every 6 (six) hours as needed (Nausea or vomiting). ?Patient taking differently: Take 10 mg by mouth every 6 (six) hours as needed for nausea or vomiting (Nausea or vomiting). 02/01/21  Yes Volanda Napoleon, MD  ?tamsulosin (FLOMAX) 0.4 MG CAPS capsule Take 0.4 mg by mouth at bedtime. 08/07/21  Yes [provider]  ?atorvastatin (LIPITOR) 80 MG tablet Take 1 tablet (80 mg total) by  mouth daily. ?Patient not taking: Reported on 12/28/2021 04/15/20   Richardson Dopp T, PA-C  ?dexamethasone (DECADRON) 4 MG tablet Take 2 tablets (8 mg total) by mouth daily. Start the day after chemotherapy for 2 days. 02/01/21   Volanda Napoleon, MD  ?diphenoxylate-atropine (LOMOTIL) 2.5-0.025 MG tablet TAKE ONE TABLET BY MOUTH FOUR TIMES A DAY AS NEEDED FOR DIARRHEA OR LOOSE STOOLS 10/31/21   Volanda Napoleon, MD  ?feeding supplement (ENSURE ENLIVE / ENSURE PLUS) LIQD Take 237 mLs by mouth 2 (two) times daily between meals. 08/16/20   Regalado, Belkys A, MD  ?gabapentin (NEURONTIN) 300 MG capsule Take 1 capsule (300 mg total) by mouth 4 (four) times daily. 10/31/21   Volanda Napoleon, MD  ?lidocaine-prilocaine (EMLA) cream Apply to affected area once 02/01/21    Volanda Napoleon, MD  ?LORazepam (ATIVAN) 0.5 MG tablet Take 1 tablet (0.5 mg total) by mouth every 6 (six) hours as needed (Nausea or vomiting). 02/01/21   Volanda Napoleon, MD  ?megestrol (MEGACE) 400 MG/10

## 2021-12-28 NOTE — Assessment & Plan Note (Addendum)
Diagnosed via echocardiogram 06/2021 ?Was placed on Xarelto at that time ?Xarelto has been held since 4/14 due to recurrent bouts of bleeding.  Patient received Jimmy Blankenship total of 6 months of anticoagulation up to this point. ?Will continue to hold anticoagulation given his recent GI bleeding ?

## 2021-12-28 NOTE — Assessment & Plan Note (Addendum)
   Continue home regimen of Flomax 

## 2021-12-28 NOTE — Patient Instructions (Addendum)
If you are age 69 or older, your body mass index should be between 23-30. Your Body mass index is 20.88 kg/m?Marland Kitchen If this is out of the aforementioned range listed, please consider follow up with your Primary Care Provider. ?__________________________________________________________ ? ?The [facility] providers would like to encourage you to use [facility] to communicate with providers for non-urgent requests or questions.  Due to long hold times on the telephone, sending your provider a message by [facility] may be a faster and more efficient way to get a response.  Please allow 48 business hours for a response.  Please remember that this is for non-urgent requests.  ? ?Due to recent changes in healthcare laws, you may see the results of your imaging and laboratory studies on MyChart before your provider has had a chance to review them.  We understand that in some cases there may be results that are confusing or concerning to you. Not all laboratory results come back in the same time frame and the provider may be waiting for multiple results in order to interpret others.  Please give [place] 48 hours in order for your provider to thoroughly review all the results before contacting the office for clarification of your results.  ? ?We will contact you by the end of day today to see if we have any availability in regards to your procedure to be done at the hospital. ? ?Thank you for choosing me and [facility]. ? ?Gerrit Heck, D.O. ? ?We want to thank you for trusting [facility] with your care. All of our staff and providers value the relationships we have built with our patients, and it is an honor to care for you.  ? ?We are writing to let you know that [facility] will close on Jan 22, 2022, and we invite you to continue to see Dr. Carmell Austria and Gerrit Heck at the [facility] [place] office location. We are consolidating our serices at these [facility] practices to better provide care. Our office staff will work with you to ensure a seamless transition.  ? ?Gerrit Heck, DO -Dr. Bryan Lemma will be movig to [facility] at [address], [place], [place] [address], effective Jan 22, 2022.  Contact [phone] to schedule an appointment with him.  ? ?Carmell Austria, MD- Dr. Lyndel Safe will be movig to [facility] at [address] [place], [place] [address], effective Jan 22, 2022.  Contact [phone] to schedule an appointment with him.  ? ?Requesting Medical Records ?If you need to request your medical records, please follow the instructions below. Your medical records are confidential, and a copy can be transferred to another provider or released to you or another person you designate only with your permission. ? ?There are several ways to request your medical records: ?Requests for medical records can be submitted through our practice.   ?You can also request your records electronically, in your MyChart account by selecting the ?Request Health Records? tab.  ?If you need additional information on how to request records, please go to [email or web address]/, choose Patient Information, then select Request Medical Records. ?To make an appointment or if you have any questions about your health care needs, please contact our office at [phone] and one of our staff members will be glad to assist you. ?[facility] is committed to providing exceptional care for you and our community. Thank you for allowing [place] to serve your health care needs. ?Sincerely, ? ?Windy Canny, Director  [facility] ?[facility] also offers convenient virtual care options. Sore throat? Sinus problems? Cold or flu symptoms? Get care from the comfort of home with [facility] Video Visits and e-Visits. Learn more about the non-emergency conditions treated and start your virtual visit at [email or web address] ? ? ?

## 2021-12-28 NOTE — Assessment & Plan Note (Addendum)
Resume patients home regimen of metoprolol ? ? ? ? ?

## 2021-12-28 NOTE — Progress Notes (Signed)
? ? ?          ?Chief Complaint: Melena, anemia, gastric cancer ? ? ?Referring Provider:     Burney Gauze, MD ? ? ?HPI:   ? ? ?Jimmy Blankenship is a 71 y.o. 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 ? ?- 08/15/2020: EGD:  normal esophagus, large fungating friable mass in the lesser curvature of the stomach and posterior wall of the stomach measuring 5 x 3 cm (path: Adenocarcinoma with signet ring cells).  Normal duodenum ?- 08/15/2020: Colonoscopy: 10 mm sigmoid polyp (path: Tubular adenoma), 5 mm transverse polyp (path: Tubular adenoma), Grade 1 internal hemorrhoids ? ?-08/28/2021: TTE: EF 51-76%, grade 1 diastolic dysfunction ? ?Was seen in the Oncology Clinic on 12/13/2021 and reported fatigue, DOE, and numbness/tingling in extremities.  Hemoglobin was 8.0 (down from 9.3), ferritin 833, iron 18, TIBC 291, sat 6%.  Was transfused 2 unit PRBCs and IV iron x2.   ? ?Since then, had 2 episodes of small-volume BRBPR.  Was seen in the ER for this issue on 12/22/2021: ?- Hemodynamically stable.  No active bleeding in ER ?- H/H 10.1/30.4 ?- BUN/creatinine 18/1.0 (baseline  BUN 9-13). ?- CT abdomen/pelvis: No acute inflammation or intra-abdominal process (multiple stable liver hypodensities consistent with hepatic metastasis, GB moderately distended without cholelithiasis or cholecystitis, hypodense mass along the lesser curvature of the gastric wall measuring 6.7 x 2.9 cm, stable from previous and consistent with gastric neoplasm) ?- Discontinued Xarelto on discharge after discussion with Dr. Marin Olp ? ?Hgb decreased from 10.1 (posttransfusion) to 8.5 earlier this week, and was again  transfused 2 unit PRBCs yesterday.   ? ?Currently undergoing chemotherapy every 2 weeks.Last tx was 12/13/2021; scheduled for infusion next week.  ? ? ?Has not had any recurrence of BRBPR. Still off Xarelto. Now with dark stools for last week or so. Also with diarrhea, which is not new and has been occurring after chemotherapy infusions.  ? ? ?Presents with daughter and wife. Daughter serves as Veterinary surgeon ([name] speaking).  ? ?Past Medical History:  ?Diagnosis Date  ? 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[place]) 08/22/2020  ? CKD (chronic kidney disease)   ? Coronary Artery Disease s/p CABG   ? hx of PCI in 2000s in [place] // s/p NSTEMI in 7/21 >> CABG  ? Diabetes mellitus 2   ? Goals of care, counseling/discussion 08/22/2020  ? Heart failure with reduced ejection fraction   ? Ischemic CM // Echo 7/21 - EF 35 // Intraop TEE 7/21: EF 40-45 // Echocardiogram 9/21: apical AK, EF 40-45, Gr 2 DD, trace AI, mild MR, mild LAE, normal RVSF, no pericardial effusion  ? High cholesterol   ? Hypertension   ? Iron deficiency anemia   ? Heme + stools (during admit for NSTEMI in 7/21)  ? 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  ? 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 ([place]) 08/22/2020  ? Palpitations   ? Event monitor 10/21: NSR, rare PVCs, rare 4 beats NSVT, rare brief non-sustained SVT  ? ? ? ?Past Surgical History:  ?Procedure Laterality Date  ? BIOPSY  08/15/2020  ? Procedure: BIOPSY;  Surgeon: Ladene Artist, MD;  Location: [facility] ENDOSCOPY;  Service: Gastroenterology;;  ? COLONOSCOPY    ? around 2013. Normal per patient. Elonda Husky  ? COLONOSCOPY N/A 08/15/2020  ? Procedure: COLONOSCOPY;  Surgeon: Ladene Artist, MD;  Location: [place];  Service: Gastroenterology;  Laterality: N/A;  ? CORONARY ANGIOPLASTY WITH STENT PLACEMENT    ? CORONARY ARTERY BYPASS GRAFT N/A 03/24/2020  ? Procedure: CORONARY ARTERY BYPASS GRAFTING (CABG), ON PUMP, TIMES THREE, USING LEFT INTERNAL MAMMARY ARTERY AND RIGHT ENDOSCOPICALLY HARVESTED GREATER  SAPHENOUS VEIN;  Surgeon: Ivin Poot, MD;  Location: [place];  Service: Open Heart Surgery;  Laterality: N/A;  ? ESOPHAGOGASTRODUODENOSCOPY N/A 08/15/2020  ? Procedure: ESOPHAGOGASTRODUODENOSCOPY (EGD);  Surgeon: Ladene Artist, MD;  Location: [place];  Service: Gastroenterology;  Laterality: N/A;  ? IR IMAGING GUIDED PORT INSERTION  09/16/2020  ? LEFT HEART CATH AND CORONARY ANGIOGRAPHY N/A 03/17/2020  ? Procedure: LEFT HEART CATH AND CORONARY ANGIOGRAPHY;  Surgeon: Belva Crome, MD;  Location: [place] CV LAB;  Service: Cardiovascular;  Laterality: N/A;  ? POLYPECTOMY  08/15/2020  ? Procedure: POLYPECTOMY;  Surgeon: Ladene Artist, MD;  Location: [place];  Service: Gastroenterology;;  ? TEE WITHOUT CARDIOVERSION N/A 03/24/2020  ? Procedure: TRANSESOPHAGEAL ECHOCARDIOGRAM (TEE);  Surgeon: [name], Collier Salina, MD;  Location: [place];  Service: Open Heart Surgery;  Laterality: N/A;  ? ?Family History  ?Problem Relation Age of Onset  ? Atrial fibrillation Mother   ? Congestive Heart Failure Mother   ? Cancer Neg Hx   ? ?Social History  ? ?Tobacco Use  ? Smoking status: Former  ?  Packs/day: 1.00  ?  Years: 30.00  ?  Pack years: 30.00  ?  Types: Cigarettes  ?  Quit date: 2006  ?  Years since quitting: 17.3  ? Smokeless tobacco: Never  ? Tobacco comments:  ?  quit 2008  ?Vaping Use  ? Vaping Use: Never used  ?Substance Use Topics  ? Alcohol use: Never  ? Drug use: Never  ? ?Current Outpatient Medications  ?Medication Sig Dispense Refill  ? atorvastatin (LIPITOR) 80 MG tablet Take 1 tablet (80 mg total) by mouth daily. 90 tablet 3  ? dexamethasone (DECADRON) 4 MG tablet Take 2 tablets (8 mg total) by mouth daily. Start the day after chemotherapy for 2 days. 30 tablet 1  ? diphenoxylate-atropine (LOMOTIL) 2.5-0.025 MG tablet TAKE ONE TABLET BY MOUTH FOUR TIMES A DAY AS NEEDED FOR DIARRHEA OR LOOSE STOOLS 100 tablet 0  ? feeding supplement (ENSURE ENLIVE / ENSURE PLUS) LIQD Take 237 mLs by mouth 2 (two) times  daily between meals. 237 mL 12  ? gabapentin (NEURONTIN) 300 MG capsule Take 1 capsule (300 mg total) by mouth 4 (four) times daily. 360 capsule 1  ? glipiZIDE (GLUCOTROL) 5 MG tablet Take 5 mg by mouth 2 (two) times daily before a meal.    ? lidocaine-prilocaine (EMLA) cream Apply to affected area once 30 g 3  ? LORazepam (ATIVAN) 0.5 MG tablet Take 1 tablet (0.5 mg total) by mouth every 6 (six) hours as needed (Nausea or vomiting). 30 tablet 0  ? megestrol (MEGACE) 400 MG/10ML suspension Take 10 mLs (400 mg total) by mouth 2 (two) times daily. 480 mL 4  ? melatonin 5 MG TABS Take 5 mg by mouth at bedtime as needed.    ? metFORMIN (GLUCOPHAGE) 1000 MG tablet Take 1 tablet by mouth 2 (two) times daily with a meal.    ? metoprolol tartrate (LOPRESSOR) 50 MG tablet TAKE ONE TABLET BY  MOUTH TWICE A DAY 180 tablet 3  ? ondansetron (ZOFRAN) 8 MG tablet Take 1 tablet (8 mg total) by mouth 2 (two) times daily as needed for refractory nausea / vomiting. Start on day 3 after chemo. 30 tablet 1  ? pantoprazole (PROTONIX) 40 MG tablet Take 1 tablet (40 mg total) by mouth 2 (two) times daily. 60 tablet 1  ? potassium chloride SA (KLOR-CON M) 20 MEQ tablet Take 1 tablet (20 mEq total) by mouth 2 (two) times daily. 10 tablet 0  ? prochlorperazine (COMPAZINE) 10 MG tablet Take 1 tablet (10 mg total) by mouth every 6 (six) hours as needed (Nausea or vomiting). 30 tablet 1  ? tamsulosin (FLOMAX) 0.4 MG CAPS capsule Take 0.4 mg by mouth at bedtime.    ? XARELTO 10 MG TABS tablet TAKE ONE TABLET BY MOUTH DAILY WITH SUPPER (Patient not taking: Reported on 12/28/2021) 30 tablet 4  ? ?No current facility-administered medications for this visit.  ? ?Facility-Administered Medications Ordered in Other Visits  ?Medication Dose Route Frequency Provider Last Rate Last Admin  ? sodium chloride flush (NS) 0.9 % injection 10 mL  10 mL Intravenous PRN Volanda Napoleon, MD      ? ?No Known Allergies ? ? ?Review of Systems: ?All systems reviewed and  negative except where noted in HPI.  ? ? ? ?Physical Exam:   ? ?Wt Readings from Last 3 Encounters:  ?12/28/21 141 lb (64 kg)  ?12/13/21 141 lb (64 kg)  ?11/23/21 147 lb (66.7 kg)  ? ? ?BP 90/70 (BP Location

## 2021-12-28 NOTE — Assessment & Plan Note (Addendum)
.   Continuing home regimen of lipid lowering therapy.  

## 2021-12-28 NOTE — Assessment & Plan Note (Addendum)
Euvolemic, follow ? ?

## 2021-12-28 NOTE — ED Triage Notes (Addendum)
Patient was told to come to the ED for a hgb-8.5. Patient had a blood transfusion yesterday at the Oncologist office and an iron transfusion 2 days ago. ?Patient is continuing to have black stools. Patient has a history of gastric cancer. ?Patient was also told to get an Endoscopy ASAP. ?

## 2021-12-28 NOTE — Assessment & Plan Note (Addendum)
Diagnosed 08/2020 ?Metastatic disease to the liver ?Complicated by left ventricular thrombus and now more recently upper gastrointestinal bleeding ?Still receiving ongoing chemotherapy treatments every 2 weeks, last dose on 4/5. ?Dr. Marin Olp following, appreciate assistance ?

## 2021-12-29 ENCOUNTER — Observation Stay (HOSPITAL_COMMUNITY): Payer: Medicare Other | Admitting: Anesthesiology

## 2021-12-29 ENCOUNTER — Encounter (HOSPITAL_COMMUNITY): Payer: Self-pay | Admitting: Internal Medicine

## 2021-12-29 ENCOUNTER — Encounter (HOSPITAL_COMMUNITY)
Admission: EM | Disposition: A | Discharge status: Home / Self Care | Payer: Self-pay | Source: Home / Self Care | Attending: Emergency Medicine

## 2021-12-29 DIAGNOSIS — I2510 Atherosclerotic heart disease of native coronary artery without angina pectoris: Secondary | ICD-10-CM

## 2021-12-29 DIAGNOSIS — K317 Polyp of stomach and duodenum: Secondary | ICD-10-CM

## 2021-12-29 DIAGNOSIS — K3189 Other diseases of stomach and duodenum: Secondary | ICD-10-CM

## 2021-12-29 DIAGNOSIS — I5022 Chronic systolic (congestive) heart failure: Secondary | ICD-10-CM

## 2021-12-29 DIAGNOSIS — I110 Hypertensive heart disease with heart failure: Secondary | ICD-10-CM

## 2021-12-29 DIAGNOSIS — K921 Melena: Secondary | ICD-10-CM | POA: Diagnosis not present

## 2021-12-29 DIAGNOSIS — K922 Gastrointestinal hemorrhage, unspecified: Secondary | ICD-10-CM | POA: Diagnosis not present

## 2021-12-29 HISTORY — PX: BIOPSY: SHX5522

## 2021-12-29 HISTORY — PX: ESOPHAGOGASTRODUODENOSCOPY (EGD) WITH PROPOFOL: SHX5813

## 2021-12-29 LAB — COMPREHENSIVE METABOLIC PANEL
ALT: 36 U/L (ref 0–44)
AST: 38 U/L (ref 15–41)
Albumin: 2.5 g/dL — ABNORMAL LOW (ref 3.5–5.0)
Alkaline Phosphatase: 165 U/L — ABNORMAL HIGH (ref 38–126)
Anion gap: 5 (ref 5–15)
BUN: 11 mg/dL (ref 8–23)
CO2: 18 mmol/L — ABNORMAL LOW (ref 22–32)
Calcium: 8.2 mg/dL — ABNORMAL LOW (ref 8.9–10.3)
Chloride: 114 mmol/L — ABNORMAL HIGH (ref 98–111)
Creatinine, Ser: 0.74 mg/dL (ref 0.61–1.24)
GFR, Estimated: >60 mL/min (ref >60)
Glucose, Bld: 105 mg/dL — ABNORMAL HIGH (ref 70–99)
Potassium: 3.2 mmol/L — ABNORMAL LOW (ref 3.5–5.1)
Sodium: 137 mmol/L (ref 135–145)
Total Bilirubin: 1.2 mg/dL (ref 0.3–1.2)
Total Protein: 5.1 g/dL — ABNORMAL LOW (ref 6.5–8.1)

## 2021-12-29 LAB — CBC WITH DIFFERENTIAL/PLATELET
Abs Immature Granulocytes: 0.02 10*3/uL (ref 0.00–0.07)
Basophils Absolute: 0 10*3/uL (ref 0.0–0.1)
Basophils Relative: 1 %
Eosinophils Absolute: 0.1 10*3/uL (ref 0.0–0.5)
Eosinophils Relative: 3 %
HCT: 31.7 % — ABNORMAL LOW (ref 39.0–52.0)
Hemoglobin: 10.9 g/dL — ABNORMAL LOW (ref 13.0–17.0)
Immature Granulocytes: 1 %
Lymphocytes Relative: 25 %
Lymphs Abs: 0.6 10*3/uL — ABNORMAL LOW (ref 0.7–4.0)
MCH: 33.1 pg (ref 26.0–34.0)
MCHC: 34.4 g/dL (ref 30.0–36.0)
MCV: 96.4 fL (ref 80.0–100.0)
Monocytes Absolute: 0.4 10*3/uL (ref 0.1–1.0)
Monocytes Relative: 18 %
Neutro Abs: 1.2 10*3/uL — ABNORMAL LOW (ref 1.7–7.7)
Neutrophils Relative %: 52 %
Platelets: 134 10*3/uL — ABNORMAL LOW (ref 150–400)
RBC: 3.29 MIL/uL — ABNORMAL LOW (ref 4.22–5.81)
RDW: 18.6 % — ABNORMAL HIGH (ref 11.5–15.5)
WBC: 2.2 10*3/uL — ABNORMAL LOW (ref 4.0–10.5)
nRBC: 0 % (ref 0.0–0.2)

## 2021-12-29 LAB — GLUCOSE, CAPILLARY
Glucose-Capillary: 153 mg/dL — ABNORMAL HIGH (ref 70–99)
Glucose-Capillary: 139 mg/dL — ABNORMAL HIGH (ref 70–99)

## 2021-12-29 LAB — APTT: aPTT: 35 seconds (ref 24–36)

## 2021-12-29 LAB — HEMOGLOBIN A1C
Hgb A1c MFr Bld: 7 % — ABNORMAL HIGH (ref 4.8–5.6)
Mean Plasma Glucose: 154.2 mg/dL

## 2021-12-29 LAB — MAGNESIUM: Magnesium: 1.4 mg/dL — ABNORMAL LOW (ref 1.7–2.4)

## 2021-12-29 LAB — CBG MONITORING, ED: Glucose-Capillary: 105 mg/dL — ABNORMAL HIGH (ref 70–99)

## 2021-12-29 LAB — LACTIC ACID, PLASMA: Lactic Acid, Venous: 1.3 mmol/L (ref 0.5–1.9)

## 2021-12-29 LAB — PROTIME-INR
INR: 1.1 (ref 0.8–1.2)
Prothrombin Time: 14.5 seconds (ref 11.4–15.2)

## 2021-12-29 LAB — HIV ANTIBODY (ROUTINE TESTING W REFLEX): HIV Screen 4th Generation wRfx: NONREACTIVE

## 2021-12-29 SURGERY — ESOPHAGOGASTRODUODENOSCOPY (EGD) WITH PROPOFOL
Anesthesia: Monitor Anesthesia Care

## 2021-12-29 MED ORDER — PHENYLEPHRINE 80 MCG/ML (10ML) SYRINGE FOR IV PUSH (FOR BLOOD PRESSURE SUPPORT)
PREFILLED_SYRINGE | INTRAVENOUS | Status: DC | PRN
  Administered 2021-12-29: 160 ug via INTRAVENOUS
  Administered 2021-12-29: 240 ug via INTRAVENOUS
  Administered 2021-12-29: 160 ug via INTRAVENOUS
  Administered 2021-12-29: 240 ug via INTRAVENOUS

## 2021-12-29 MED ORDER — POTASSIUM CHLORIDE CRYS ER 20 MEQ PO TBCR
40.0000 meq | EXTENDED_RELEASE_TABLET | Freq: Once | ORAL | Status: AC
  Administered 2021-12-29: 40 meq via ORAL
  Filled 2021-12-29: qty 2

## 2021-12-29 MED ORDER — POTASSIUM CHLORIDE 10 MEQ/100ML IV SOLN
10.0000 meq | INTRAVENOUS | Status: AC
  Administered 2021-12-29: 10 meq via INTRAVENOUS
  Filled 2021-12-29 (×2): qty 100

## 2021-12-29 MED ORDER — PROPOFOL 10 MG/ML IV BOLUS
INTRAVENOUS | Status: DC | PRN
  Administered 2021-12-29 (×3): 40 mg via INTRAVENOUS
  Administered 2021-12-29: 60 mg via INTRAVENOUS

## 2021-12-29 MED ORDER — MAGNESIUM SULFATE 4 GM/100ML IV SOLN
4.0000 g | Freq: Once | INTRAVENOUS | Status: AC
  Administered 2021-12-29: 4 g via INTRAVENOUS
  Filled 2021-12-29: qty 100

## 2021-12-29 MED ORDER — SUCRALFATE 1 GM/10ML PO SUSP
1.0000 g | Freq: Four times a day (QID) | ORAL | Status: DC
Start: 2021-12-29 — End: 2021-12-30
  Administered 2021-12-29 – 2021-12-30 (×3): 1 g via ORAL
  Filled 2021-12-29 (×3): qty 10

## 2021-12-29 MED ORDER — LACTATED RINGERS IV SOLN: INTRAVENOUS | Status: AC

## 2021-12-29 MED ORDER — SODIUM CHLORIDE 0.9 % IV SOLN: INTRAVENOUS | Status: DC

## 2021-12-29 SURGICAL SUPPLY — 15 items

## 2021-12-29 NOTE — Progress Notes (Addendum)
? ? ? ?[facility] Progress Note ? ?CC:  GI bleed, anemia, gastric cancer ? ?Subjective:  Family at bedside who served as interpreter.  He is feeling fine.  No pain.  He says that he received 2 units of blood and 2 doses of IV iron on 4/19.  Please see full office note from Dr. Bryan Blankenship on 4/20 for further details. ? ? ?Objective:  ?Vital signs in last 24 hours: ?Temp:  [98.3 ?F (36.8 ?C)-98.4 ?F (36.9 ?C)] 98.3 ?F (36.8 ?C) (04/21 0200) ?Pulse Rate:  [72-135] 82 (04/21 0900) ?Resp:  [14-27] 17 (04/21 0900) ?BP: (90-131)/(66-81) 120/73 (04/21 0800) ?SpO2:  [95 %-100 %] 96 % (04/21 0900) ?Weight:  [64 kg] 64 kg (04/20 1900) ?  ?General:  Alert, Well-developed, in NAD ?Heart:  Regular rate and rhythm; no murmurs ?Pulm:  CTAB.  No W/R/R. ?Abdomen:  Soft, non-distended.  BS present.  Non-tender. ?Extremities:  Without edema. ?Neurologic:  Alert and oriented x 4;  grossly normal neurologically. ?Psych:  Alert and cooperative. Normal mood and affect. ? ?Intake/Output from previous day: ?04/20 0701 - 04/21 0700 ?In: 1100 [IV Piggyback:1100] ?Out: -  ? ?Lab Results: ?Recent Labs  ?  12/26/21 ?1330 12/28/21 ?1920 12/29/21 ?9798  ?WBC 2.6* 2.8* 2.2*  ?HGB 8.5* 11.4* 10.9*  ?HCT 25.3* 33.5* 31.7*  ?PLT 153 163 134*  ? ?BMET ?Recent Labs  ?  12/28/21 ?1920 12/29/21 ?9211  ?NA 136 137  ?K 3.9 3.2*  ?CL 111 114*  ?CO2 18* 18*  ?GLUCOSE 107* 105*  ?BUN 11 11  ?CREATININE 0.90 0.74  ?CALCIUM 8.7* 8.2*  ? ?LFT ?Recent Labs  ?  12/29/21 ?9417  ?PROT 5.1*  ?ALBUMIN 2.5*  ?AST 38  ?ALT 36  ?ALKPHOS 165*  ?BILITOT 1.2  ? ?PT/INR ?Recent Labs  ?  12/29/21 ?0031  ?LABPROT 14.5  ?INR 1.1  ? ?Assessment / Plan: ?*71 year old male with metastatic gastric cancer (diagnosed 08/2020 and undergoing chemo currently every 2 weeks-follows with Dr. Marin Blankenship) who has been having melena.  Anemia with acute blood loss component superimposed on chronic iron deficiency.  Needs EGD.  Was seen in our office yesterday and sent to the  emergency department for expedited care (please see full note by Dr. Bryan Blankenship from 12/28/2021).  His hemoglobin is 10.9 g (he reports getting two units of blood and 2 doses of IV iron on 4/19).  He is Hemoccult positive.  Concern for tumor bleeding at which time endoscopic intervention would be quite limited.  Separately, he could have an ulcer or other source that would be amenable to endoscopic intervention. ?*CHF with EF 40 to 45% ?*Coronary artery disease status post CABG ?*Ventricular mural thrombus: Was on Xarelto but that was stopped earlier this month. ? ?-We will plan for EGD later today with Dr. Lyndel Blankenship.  After I saw the patient I was informed that EGD might get bumped to 4/22 depending on availability in Endo later today.  As soon as we find out then we can adjust his diet and if not until tomorrow then we can eat today with NPO after midnight for EGD tomorrow. ?-May need to involve IR for embolization or radiation oncology for palliative radiation pending findings. ?-Otherwise monitor hemoglobin and transfuse if needed. ?-Pantoprazole 40 mg IV twice daily for now. ? ? ? LOS: 0 days  ? ?Jimmy Blankenship  12/29/2021, 9:06 AM ? ? ? Attending physician's note  ? ?I have taken history, reviewed the chart and examined the patient. I performed  a substantive portion of this encounter, including complete performance of at least one of the key components, in conjunction with the APP. I agree with the Advanced Practitioner's note, impression and recommendations.  ? ?Metastatic gastric cancer with liver mets (dx Dec 2021) on chemo, now with recurrent melena req frequent transfusions/IV iron. CT AP 4/14 shows persistent gastric mass.  No new liver mets. ? ?Ventricular mural thrombus on Xarelto (held 4/14 onwards d/t recurrent GI bleeding) ? ?CHF with EF 40-45%. CAD s/p CABG ? ?Plan: ?-IV Protonix ?-EGD today. ?-Trend CBC ?-Agree to hold Xarelto ?-May need to involve IR for embolization (if feasible) or radiation  oncology for palliative XRT depending upon above EGD findings.  Not good options from endoscopic standpoint.  Overall poor prognosis.  Family does understand.  I also reviewed excellent thorough note from Dr. Bryan Blankenship from yesterday. ? ? ?CT Abdo/pelvis reviewed ? ? ?Jimmy Austria, MD ?[name] ?[phone] ? ? ?  ?

## 2021-12-29 NOTE — Telephone Encounter (Signed)
Pt is currently admitted

## 2021-12-29 NOTE — Transfer of Care (Signed)
Immediate Anesthesia Transfer of Care Note ? ?Patient: Jimmy Blankenship ? ?Procedure(s) Performed: ESOPHAGOGASTRODUODENOSCOPY (EGD) WITH PROPOFOL ?BIOPSY ? ?Patient Location: PACU and Endoscopy Unit ? ?Anesthesia Type:MAC ? ?Level of Consciousness: drowsy ? ?Airway & Oxygen Therapy: Patient Spontanous Breathing and Patient connected to face mask ? ?Post-op Assessment: Report given to RN and Post -op Vital signs reviewed and stable ? ?Post vital signs: Reviewed and stable ? ?Last Vitals:  ?Vitals Value Taken Time  ?BP 74/44 12/29/21 1428  ?Temp    ?Pulse 82 12/29/21 1429  ?Resp 19 12/29/21 1429  ?SpO2 100 % 12/29/21 1429  ?Vitals shown include unvalidated device data. ? ?Last Pain:  ?Vitals:  ? 12/29/21 0200  ?TempSrc: Oral  ?PainSc:   ?   ? ?  ? ?Complications: No notable events documented. ?

## 2021-12-29 NOTE — Consult Note (Addendum)
[place]  ?[phone] [phone]  ? ?MEDICAL ONCOLOGY - CONSULTATION ? ?Referral MD: Dr. Alben Deeds ? ?Reason for Consultation: Metastatic gastric cancer ? ?HPI: [name] is a 71 year old male with a past medical history significant for CAD status post CABG July 2021, hyperlipidemia, hypertension, diabetes mellitus, ischemic cardiomyopathy with systolic congestive heart failure (echocardiogram 08/2021 with LVEF of 40 to 45% with G1DD), metastatic adenocarcinoma of the stomach diagnosed December 2021, iron deficiency anemia, and left ventricular thrombus diagnosed October 2022.  The patient was sent to the emergency department by GI due to ongoing melena and weakness.  He has been experiencing melena since late March/early April.  In addition to generalized weakness and dyspnea on exertion, he is also experiencing numbness and tingling in his extremities.  GI is planning for an EGD today.  On admission, his BC was 2.8, hemoglobin 11.4, calcium 8.7, total protein 6.1, albumin 3.1, AST 43, ALT 45, alk phos 196, T. bili 1.6, stool for occult blood positive. ? ?He has been receiving Enhertu for his metastatic gastric cancer.  Last cycle was given on 12/13/2021.  Has been tolerating treatment well overall.  Restaging CT scans performed on 11/17/2021 showed a response to treatment and a repeat CT of the abdomen/pelvis performed on 12/22/2021 showed stable disease. ? ?The patient reports that he has ongoing melena but overall thinks that it is less than it was yesterday.  Has ongoing generalized weakness.  He otherwise has no fevers, chills, headaches, chest pain, shortness of breath at rest, abdominal pain, nausea, vomiting, hematemesis.  Medical oncology was asked see the patient to make recommendations regarding his metastatic gastric cancer. ? ?Past Medical History:  ?Diagnosis Date  ? 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[place]) 08/22/2020  ? CKD (chronic kidney disease)   ?  Coronary Artery Disease s/p CABG   ? hx of PCI in 2000s in [place] // s/p NSTEMI in 7/21 >> CABG  ? Diabetes mellitus 2   ? Goals of care, counseling/discussion 08/22/2020  ? Heart failure with reduced ejection fraction   ? Ischemic CM // Echo 7/21 - EF 35 // Intraop TEE 7/21: EF 40-45 // Echocardiogram 9/21: apical AK, EF 40-45, Gr 2 DD, trace AI, mild MR, mild LAE, normal RVSF, no pericardial effusion  ? High cholesterol   ? Hypertension   ? Iron deficiency anemia   ? Heme + stools (during admit for NSTEMI in 7/21)  ? 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  ? 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 ([place]) 08/22/2020  ? Palpitations   ? Event monitor 10/21: NSR, rare PVCs, rare 4 beats NSVT, rare brief non-sustained SVT  ?: ? ? ?Past Surgical History:  ?Procedure Laterality Date  ? BIOPSY  08/15/2020  ? Procedure: BIOPSY;  Surgeon: Ladene Artist, MD;  Location: [facility] ENDOSCOPY;  Service: Gastroenterology;;  ? COLONOSCOPY    ? around 2013. Normal per patient. Elonda Husky  ? COLONOSCOPY N/A 08/15/2020  ? Procedure: COLONOSCOPY;  Surgeon: Ladene Artist, MD;  Location: [place];  Service: Gastroenterology;  Laterality: N/A;  ? CORONARY ANGIOPLASTY WITH STENT PLACEMENT    ? CORONARY ARTERY BYPASS GRAFT N/A 03/24/2020  ? Procedure: CORONARY ARTERY BYPASS GRAFTING (CABG), ON PUMP, TIMES THREE, USING LEFT INTERNAL MAMMARY ARTERY AND RIGHT ENDOSCOPICALLY HARVESTED GREATER SAPHENOUS VEIN;  Surgeon: Ivin Poot, MD;  Location: [place];  Service: Open Heart Surgery;  Laterality: N/A;  ? ESOPHAGOGASTRODUODENOSCOPY N/A 08/15/2020  ? Procedure: ESOPHAGOGASTRODUODENOSCOPY (EGD);  Surgeon: Lucio Edward  T, MD;  Location: [place] ENDOSCOPY;  Service: Gastroenterology;  Laterality: N/A;  ? IR IMAGING GUIDED PORT INSERTION  09/16/2020  ? LEFT HEART CATH AND CORONARY ANGIOGRAPHY N/A 03/17/2020  ? Procedure: LEFT HEART CATH AND CORONARY ANGIOGRAPHY;  Surgeon: Belva Crome, MD;  Location: [place] CV LAB;  Service: Cardiovascular;  Laterality:  N/A;  ? POLYPECTOMY  08/15/2020  ? Procedure: POLYPECTOMY;  Surgeon: Ladene Artist, MD;  Location: [place];  Service: Gastroenterology;;  ? TEE WITHOUT CARDIOVERSION N/A 03/24/2020  ? Procedure: TRANSESOPHAGEAL ECHOCARDIOGRAM (TEE);  Surgeon: [name], Collier Salina, MD;  Location: [place];  Service: Open Heart Surgery;  Laterality: N/A;  ?: ? ? ?Current Facility-Administered Medications  ?Medication Dose Route Frequency Provider Last Rate Last Admin  ? acetaminophen (TYLENOL) tablet 650 mg  650 mg Oral Q6H PRN Shalhoub, Sherryll Burger, MD      ? Or  ? acetaminophen (TYLENOL) suppository 650 mg  650 mg Rectal Q6H PRN Shalhoub, Sherryll Burger, MD      ? atorvastatin (LIPITOR) tablet 80 mg  80 mg Oral Daily Shalhoub, Sherryll Burger, MD      ? diphenoxylate-atropine (LOMOTIL) 2.5-0.025 MG per tablet 1 tablet  1 tablet Oral QID PRN Shalhoub, Sherryll Burger, MD      ? feeding supplement (ENSURE ENLIVE / ENSURE PLUS) liquid 237 mL  237 mL Oral BID BM Shalhoub, Sherryll Burger, MD      ? gabapentin (NEURONTIN) capsule 300 mg  300 mg Oral TID Vernelle Emerald, MD   300 mg at 12/29/21 0019  ? hydrALAZINE (APRESOLINE) injection 10 mg  10 mg Intravenous Q6H PRN Shalhoub, Sherryll Burger, MD      ? insulin aspart (novoLOG) injection 0-15 Units  0-15 Units Subcutaneous TID AC & HS Shalhoub, Sherryll Burger, MD      ? lactated ringers infusion   Intravenous Continuous Shalhoub, Sherryll Burger, MD 75 mL/hr at 12/29/21 0205 New Bag at 12/29/21 0205  ? metoprolol tartrate (LOPRESSOR) tablet 50 mg  50 mg Oral BID Vernelle Emerald, MD   50 mg at 12/29/21 0019  ? ondansetron (ZOFRAN) tablet 4 mg  4 mg Oral Q6H PRN Shalhoub, Sherryll Burger, MD      ? Or  ? ondansetron (ZOFRAN) injection 4 mg  4 mg Intravenous Q6H PRN Shalhoub, Sherryll Burger, MD      ? pantoprazole (PROTONIX) injection 40 mg  40 mg Intravenous Q12H Shalhoub, Sherryll Burger, MD      ? polyethylene glycol (MIRALAX / GLYCOLAX) packet 17 g  17 g Oral Daily PRN Shalhoub, Sherryll Burger, MD      ? tamsulosin (FLOMAX) capsule 0.4 mg  0.4 mg Oral QHS  Shalhoub, Sherryll Burger, MD   0.4 mg at 12/29/21 0019  ? ?Current Outpatient Medications  ?Medication Sig Dispense Refill  ? atorvastatin (LIPITOR) 40 MG tablet Take 80 mg by mouth daily.    ? bismuth subsalicylate (PEPTO BISMOL) 262 MG/15ML suspension Take 15 mLs by mouth every 6 (six) hours as needed for indigestion.    ? diphenoxylate-atropine (LOMOTIL) 2.5-0.025 MG tablet TAKE ONE TABLET BY MOUTH FOUR TIMES A DAY AS NEEDED FOR DIARRHEA OR LOOSE STOOLS (Patient taking differently: Take 1 tablet by mouth 4 (four) times daily as needed for diarrhea or loose stools.) 100 tablet 0  ? feeding supplement (ENSURE ENLIVE / ENSURE PLUS) LIQD Take 237 mLs by mouth 2 (two) times daily between meals. 237 mL 12  ? gabapentin (NEURONTIN) 300 MG capsule Take 1 capsule (  300 mg total) by mouth 4 (four) times daily. (Patient taking differently: Take 300 mg by mouth 3 (three) times daily.) 360 capsule 1  ? glipiZIDE (GLUCOTROL) 5 MG tablet Take 5 mg by mouth 2 (two) times daily before a meal.    ? insulin glargine (LANTUS) 100 unit/mL SOPN Inject 10 Units into the skin daily as needed (for blood sugar over 200).    ? lidocaine-prilocaine (EMLA) cream Apply to affected area once (Patient taking differently: 1 application. daily as needed (port access).) 30 g 3  ? metFORMIN (GLUCOPHAGE) 1000 MG tablet Take 1,000 mg by mouth 2 (two) times daily with a meal.    ? metoprolol tartrate (LOPRESSOR) 50 MG tablet TAKE ONE TABLET BY MOUTH TWICE A DAY (Patient taking differently: Take 50 mg by mouth 2 (two) times daily.) 180 tablet 3  ? ondansetron (ZOFRAN) 8 MG tablet Take 1 tablet (8 mg total) by mouth 2 (two) times daily as needed for refractory nausea / vomiting. Start on day 3 after chemo. 30 tablet 1  ? prochlorperazine (COMPAZINE) 10 MG tablet Take 1 tablet (10 mg total) by mouth every 6 (six) hours as needed (Nausea or vomiting). (Patient taking differently: Take 10 mg by mouth every 6 (six) hours as needed for nausea or vomiting (Nausea or  vomiting).) 30 tablet 1  ? tamsulosin (FLOMAX) 0.4 MG CAPS capsule Take 0.4 mg by mouth at bedtime.    ? atorvastatin (LIPITOR) 80 MG tablet Take 1 tablet (80 mg total) by mouth daily. (Patient not tak

## 2021-12-29 NOTE — Op Note (Addendum)
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                          2021) on chemo, now with recurrent melena req  ?                          frequent transfusions/IV iron. CT AP 4/14 shows  ?                          persistent gastric mass. No new liver mets. ?Providers:                Jackquline Denmark, MD, Carmie End, RN, [place]  ?                          Houle, Technician ?Referring MD:              ?Medicines:                Monitored Anesthesia Care ?Complications:            No immediate complications. ?Estimated Blood Loss:     Estimated blood loss: none. ?Procedure:                Pre-Anesthesia Assessment: ?                          - Prior to the procedure, a History and Physical  ?                          was performed, and patient medications and  ?                          allergies were reviewed. The patient's tolerance of  ?                          previous anesthesia was also reviewed. The risks  ?                          and benefits of the procedure and the sedation  ?                          options and risks were discussed with the patient.  ?                          All questions were answered, and informed consent  ?                          was obtained. Prior Anticoagulants: The patient has  ?  taken Xarelto (rivaroxaban), last dose was 7 days  ?                          prior to procedure. ASA Grade Assessment: III - A  ?                          patient with severe systemic disease. After  ?                          reviewing the risks and benefits, the patient was  ?                          deemed in satisfactory condition to undergo the  ?                           procedure. ?                          After obtaining informed consent, the endoscope was  ?                          passed under direct vision. Throughout the  ?                          procedure, the patient's blood pressure, pulse, and  ?                          oxygen saturations were monitored continuously. The  ?                          [id number]-H190 ([id number]) Olympus endoscope was introduced  ?                          through the mouth, and advanced to the second part  ?                          of duodenum. The upper GI endoscopy was  ?                          accomplished without difficulty. The patient  ?                          tolerated the procedure well. ?Scope In: ?Scope Out: ?Findings: ?     The examined esophagus was normal with well-defined Z-line at 40 cm. ?     Localized indurated nodular mucosa with central depression (no ulcers or  ?     bleeding) was found in the gastric body along the lesser curvature of  ?     the stomach and at incisura. This measured approximately 4 cm x 5 cm.  ?     Superficial biopsies were carefully taken with a cold forceps for  ?     histology (as to avoid bleeding). ?     A single 10 mm sessile polyp with no bleeding was found in the first  ?     portion  of the duodenum. Biopsies were taken with a cold forceps for  ?     histology. ?Impression:               - Nodular mucosa in the gastric body, in the lesser  ?                          curvature of the stomach and in the incisura.  ?                          Biopsied. ?                          - A single duodenal polyp. Biopsied. ?Moderate Sedation: ?     Not Applicable - Patient had care per Anesthesia. ?Recommendation:           - Return patient to hospital ward for ongoing care. ?                          - Resume previous diet. ?                          - Await pathology results. ?                          - Use Protonix (pantoprazole) 40 mg PO BID. ?                          - Use sucralfate suspension 1  gram PO QID for 2  ?                          weeks. ?                          - Agree to hold Xarelto. ?                          - Check CBC in AM. If stable, D/C home with outpt  ?                          GI FU ?                          - If still with bleeding and future, would consider  ?                          IR embolization. ?                          - The findings and recommendations were discussed  ?                          with the patient's family. Pt and family do  ?                          understand that unfortunately bleeding, at times,  ?  could be a terminal event. ?Procedure Code(s):        --- Professional --- ?                          [phone], Esophagogastroduodenoscopy, flexible,  ?                          transoral; with biopsy, single or multiple ?Diagnosis Code(s):        --- Professional --- ?                          K31.89, Other diseases of stomach and duodenum ?                          K31.7, Polyp of stomach and duodenum ?                          K92.1, Melena (includes Hematochezia) ?CPT copyright 2019 American Medical Association. All rights reserved. ?The codes documented in this report are preliminary and upon coder review may  ?be revised to meet current compliance requirements. ?Jackquline Denmark, MD ?12/29/2021 2:38:15 PM ?This report has been signed electronically. ?Number of Addenda: 0 ?

## 2021-12-29 NOTE — Anesthesia Preprocedure Evaluation (Signed)
Anesthesia Evaluation  ?Patient identified by MRN, date of birth, ID band ?Patient awake ? ? ? ?Reviewed: ?Allergy & Precautions, H&P , NPO status , Patient's Chart, lab work & pertinent test results ? ?Airway ?Mallampati: II ? ? ?Neck ROM: full ? ? ? Dental ?  ?Pulmonary ?former smoker,  ?  ?breath sounds clear to auscultation ? ? ? ? ? ? Cardiovascular ?hypertension, Pt. on home beta blockers ?+ CAD, + CABG and +CHF  ?+ Valvular Problems/Murmurs MR  ?Rhythm:regular Rate:Normal ? ?EF 40-45% ?  ?Neuro/Psych ? Neuromuscular disease   ? GI/Hepatic ?Neg liver ROS, Gastric CA ?  ?Endo/Other  ?diabetes, Type 2 ? Renal/GU ?Renal disease  ? ?  ?Musculoskeletal ? ? Abdominal ?  ?Peds ? Hematology ? ?(+) Blood dyscrasia, anemia ,   ?Anesthesia Other Findings ? ? Reproductive/Obstetrics ? ?  ? ? ? ? ? ? ? ? ? ? ? ? ? ?  ?  ? ? ? ? ? ? ? ? ?Anesthesia Physical ? ?Anesthesia Plan ? ?ASA: III ? ?Anesthesia Plan: MAC  ? ?Post-op Pain Management: Minimal or no pain anticipated  ? ?Induction:  ? ?PONV Risk Score and Plan: 2 and Treatment may vary due to age or medical condition and Propofol infusion ? ?Airway Management Planned: Nasal Cannula and Natural Airway ? ?Additional Equipment:  ? ?Intra-op Plan:  ? ?Post-operative Plan:  ? ?Informed Consent: I have reviewed the patients History and Physical, chart, labs and discussed the procedure including the risks, benefits and alternatives for the proposed anesthesia with the patient or authorized representative who has indicated his/her understanding and acceptance.  ? ? ? ? ? ?Plan Discussed with: CRNA, Anesthesiologist and Surgeon ? ?Anesthesia Plan Comments:   ? ? ? ? ? ? ?Anesthesia Quick Evaluation ? ?

## 2021-12-29 NOTE — Hospital Course (Addendum)
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 ?  ?Patient presents to [facility] emergency department today at the direction of Dr. Bryan Lemma with gastroenterology due to ongoing melena and weakness.  ? ?Now s/p EGD with gastroenterology.  EGD showed nodular mucosa in the gastric body, in the lesser curvature of the stomach and in the incisura, as well as Jadan Hinojos single duodenal polyp.  Biopsies are pending at the time of discharge.  GI recommended PPI BID, sucralfate x2 weeks, and continuing to hold xarelto.   ? ?See below for additional details ?

## 2021-12-29 NOTE — Progress Notes (Signed)
?PROGRESS NOTE ? ? ? ?Jimmy Blankenship  DJM:[id number] DOB: 07-05-1951 DOA: 12/28/2021 ?PCP: Jimmy Holstein, MD  ?Chief Complaint  ?Patient presents with  ? Abnormal Lab  ? ? 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 ?  ?Patient presents to [facility] emergency department today at the direction of Dr. Bryan Blankenship with gastroenterology due to ongoing melena and weakness.  ? ?Now s/p EGD with gastroenterology.  ? ? ?Assessment & Plan: ?  ?Principal Problem: ?  Acute upper gastrointestinal bleeding ?Active Problems: ?  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[place]) ?  Left ventricular thrombus ?  Coronary artery disease involving native coronary artery of native heart without angina pectoris ?  Chronic combined systolic and diastolic congestive heart failure ([place]) ?  Controlled type 2 diabetes mellitus with diabetic polyneuropathy, with long-term current use of insulin ([place]) ?  Mixed diabetic hyperlipidemia associated with type 2 diabetes mellitus ([place]) ?  Essential hypertension ?  BPH (benign prostatic hyperplasia) ? ? ?Assessment and Plan: ?* Acute upper gastrointestinal bleeding ?Patient presenting with several week history of melena, bright red blood per rectum and generalized weakness ?Now s/p EGD, nodular mucosa in gastric body, in lesser curvature of the stomach and in the incisura.  Single duodenal polyp.  Biopsied. ?GI recommended following up path, PPI BID, sucralfate 1 g PO QID x2 weeks, recommending holding xarelto.  If  bleeding in future, consider IR embolization. ? ?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[place]) ?Diagnosed 08/2020 ?Metastatic disease to the liver ?Complicated by left ventricular thrombus and now more recently upper gastrointestinal bleeding ?Still receiving ongoing chemotherapy treatments every 2 weeks, last dose on 4/5. ?Dr. Marin Blankenship following, appreciate assistance ? ?Left ventricular thrombus ?Diagnosed via echocardiogram 06/2021 ?Was placed on Xarelto at that time ?Xarelto has been held since 4/14 due to recurrent bouts of bleeding.  Patient received Merwyn Hodapp total of 6 months of anticoagulation up to this point. ?Will continue to hold anticoagulation ? ?Coronary artery disease involving native coronary artery of native heart without angina pectoris ?Monitoring patient on telemetry ?Currently holding all forms of anticoagulation ?Continuing home regimen of lipid lowering therapy and AV nodal blocking therapy ? ? ?Chronic combined systolic and diastolic congestive heart failure ([place]) ?Euvolemic, follow ? ? ?Controlled type 2 diabetes mellitus with diabetic polyneuropathy, with long-term current use of insulin ([place]) ?Holding home regimen of oral hypoglycemics ?Holding home lantus ?SSI ?A1c 7 ? ?Mixed diabetic hyperlipidemia associated with type 2 diabetes mellitus ([place]) ?Continuing home regimen of lipid lowering therapy. ? ? ?Essential hypertension ?Resume patients home regimen of metoprolol ? ? ? ? ? ?BPH (benign prostatic hyperplasia) ?Continue home regimen of Flomax. ? ? ?DVT prophylaxis: SCD ?Code Status:DNF ?Family Communication: wife at bedside ?Disposition:  ? ?Status is: Observation ?The patient remains OBS appropriate and will d/c before 2 midnights. ?  ?Consultants:  ?GI ?oncology ? ?Procedures:  ?EGD ? ?Antimicrobials:  ?Anti-infectives (From admission, onward)  ? ? None  ? ?  ? ? ?Subjective: ?No new complaints.  Denies pain.  Last bloody stool on 4/19. ?Jimmy Blankenship interpreter used ? ?Objective: ?Vitals:  ?  12/29/21 1440  12/29/21 1450 12/29/21 1500 12/29/21 1536  ?BP: 114/66 106/63 104/62 120/78  ?Pulse: 83 85 84 82  ?Resp: '15 17 19 19  '$ ?Temp:    97.8 ?F (36.6 ?C)  ?TempSrc:    Oral  ?SpO2: 97% 97% 95% 97%  ?Weight:      ?Height:      ? ? ?Intake/Output Summary (Last 24 hours) at 12/29/2021 1937 ?Last data filed at 12/29/2021 1800 ?Gross per 24 hour  ?Intake 2485.62 ml  ?Output --  ?Net 2485.62 ml  ? ?Filed Weights  ? 12/28/21 1900  ?Weight: 64 kg  ? ? ?Examination: ? ?General exam: Appears calm and comfortable  ?Respiratory system: unlabored ?Cardiovascular system: RRR ?Gastrointestinal system: Abdomen is nondistended, soft and nontender. ?Central nervous system: Alert and oriented. No focal neurological deficits. ?Extremities: no LEE ? ? ?Data Reviewed: I have personally reviewed following labs and imaging studies ? ?CBC: ?Recent Labs  ?Lab 12/26/21 ?1330 12/28/21 ?1920 12/29/21 ?3710  ?WBC 2.6* 2.8* 2.2*  ?NEUTROABS 1.7 1.5* 1.2*  ?HGB 8.5* 11.4* 10.9*  ?HCT 25.3* 33.5* 31.7*  ?MCV 99.6 97.4 96.4  ?PLT 153 163 134*  ? ? ?Basic Metabolic Panel: ?Recent Labs  ?Lab 12/28/21 ?1920 12/29/21 ?6269  ?NA 136 137  ?K 3.9 3.2*  ?CL 111 114*  ?CO2 18* 18*  ?GLUCOSE 107* 105*  ?BUN 11 11  ?CREATININE 0.90 0.74  ?CALCIUM 8.7* 8.2*  ?MG  --  1.4*  ? ? ?GFR: ?Estimated Creatinine Clearance: 78.9 mL/min (by C-G formula based on SCr of 0.74 mg/dL). ? ?Liver Function Tests: ?Recent Labs  ?Lab 12/28/21 ?1920 12/29/21 ?4854  ?AST 43* 38  ?ALT 45* 36  ?ALKPHOS 196* 165*  ?BILITOT 1.6* 1.2  ?PROT 6.1* 5.1*  ?ALBUMIN 3.1* 2.5*  ? ? ?CBG: ?Recent Labs  ?Lab 12/29/21 ?0904 12/29/21 ?1635  ?GLUCAP 105* 153*  ? ? ? ?Recent Results (from the past 240 hour(s))  ?Resp Panel by RT-PCR (Flu Smith Mcnicholas&B, Covid) Nasopharyngeal Swab     Status: None  ? Collection Time: 12/28/21  7:20 PM  ? Specimen: Nasopharyngeal Swab; Nasopharyngeal(NP) swabs in vial transport medium  ?Result Value Ref Range Status  ? SARS Coronavirus 2 by RT PCR NEGATIVE NEGATIVE Final  ?   Comment: (NOTE) ?SARS-CoV-2 target nucleic acids are NOT DETECTED. ? ?The SARS-CoV-2 RNA is generally detectable in upper respiratory ?specimens during the acute phase of infection. The lowest ?concentration of SARS-CoV-2 viral copies this assay can detect is ?138 copies/mL. Loron Weimer negative result does not preclude SARS-Cov-2 ?infection and should not be used as the sole basis for treatment or ?other patient management decisions. Donovan Gatchel negative result may occur with  ?improper specimen collection/handling, submission of specimen other ?than nasopharyngeal swab, presence of viral mutation(s) within the ?areas targeted by this assay, and inadequate number of viral ?copies(<138 copies/mL). Mycah Formica negative result must be combined with ?clinical observations, patient history, and epidemiological ?information. The expected result is Negative. ? ?Fact Sheet for Patients:  ?EntrepreneurPulse.com.au ? ?Fact Sheet for Healthcare Providers:  ?IncredibleEmployment.be ? ?This test is no t yet approved or cleared by the [place] FDA and  ?has been authorized for detection and/or diagnosis of SARS-CoV-2 by ?FDA under an Emergency Use Authorization (EUA). This EUA will remain  ?in effect (meaning this test can be used) for the duration of the ?COVID-19 declaration under Section 564(b)(1) of the Act, 21 ?U.S.C.section 360bbb-3(b)(1), unless the authorization is terminated  ?or revoked sooner.  ? ? ?  ? Influenza Abrian Hanover by PCR NEGATIVE  NEGATIVE Final  ? Influenza B by PCR NEGATIVE NEGATIVE Final  ?  Comment: (NOTE) ?The Xpert Xpress SARS-CoV-2/FLU/RSV plus assay is intended as an aid ?in the diagnosis of influenza from Nasopharyngeal swab specimens and ?should not be used as Dashton Czerwinski sole basis for treatment. Nasal washings and ?aspirates are unacceptable for Xpert Xpress SARS-CoV-2/FLU/RSV ?testing. ? ?Fact Sheet for Patients: ?EntrepreneurPulse.com.au ? ?Fact Sheet for Healthcare  Providers: ?IncredibleEmployment.be ? ?This test is not yet approved or cleared by the [place] FDA and ?has been authorized for detection and/or diagnosis of SARS-CoV-2 by ?FDA under an Emergency Use Au

## 2021-12-29 NOTE — Anesthesia Postprocedure Evaluation (Signed)
Anesthesia Post Note ? ?Patient: Jimmy Blankenship ? ?Procedure(s) Performed: ESOPHAGOGASTRODUODENOSCOPY (EGD) WITH PROPOFOL ?BIOPSY ? ?  ? ?Patient location during evaluation: PACU ?Anesthesia Type: MAC ?Level of consciousness: awake and alert ?Pain management: pain level controlled ?Vital Signs Assessment: post-procedure vital signs reviewed and stable ?Respiratory status: spontaneous breathing, nonlabored ventilation, respiratory function stable and patient connected to nasal cannula oxygen ?Cardiovascular status: stable and blood pressure returned to baseline ?Postop Assessment: no apparent nausea or vomiting ?Anesthetic complications: no ? ? ?No notable events documented. ? ?Last Vitals:  ?Vitals:  ? 12/29/21 1450 12/29/21 1500  ?BP: 106/63 104/62  ?Pulse: 85 84  ?Resp: 17 19  ?Temp:    ?SpO2: 97% 95%  ?  ?Last Pain:  ?Vitals:  ? 12/29/21 1450  ?TempSrc:   ?PainSc: 0-No pain  ? ? ?  ?  ?  ?  ?  ?  ? ?Suzette Battiest E ? ? ? ? ?

## 2021-12-30 DIAGNOSIS — K922 Gastrointestinal hemorrhage, unspecified: Secondary | ICD-10-CM | POA: Diagnosis not present

## 2021-12-30 LAB — IRON AND TIBC
Iron: 43 ug/dL — ABNORMAL LOW (ref 45–182)
Saturation Ratios: 18 % (ref 17.9–39.5)
TIBC: 238 ug/dL — ABNORMAL LOW (ref 250–450)
UIBC: 195 ug/dL

## 2021-12-30 LAB — COMPREHENSIVE METABOLIC PANEL
ALT: 35 U/L (ref 0–44)
AST: 39 U/L (ref 15–41)
Albumin: 2.4 g/dL — ABNORMAL LOW (ref 3.5–5.0)
Alkaline Phosphatase: 164 U/L — ABNORMAL HIGH (ref 38–126)
Anion gap: 5 (ref 5–15)
BUN: 10 mg/dL (ref 8–23)
CO2: 20 mmol/L — ABNORMAL LOW (ref 22–32)
Calcium: 8.3 mg/dL — ABNORMAL LOW (ref 8.9–10.3)
Chloride: 112 mmol/L — ABNORMAL HIGH (ref 98–111)
Creatinine, Ser: 0.95 mg/dL (ref 0.61–1.24)
GFR, Estimated: >60 mL/min (ref >60)
Glucose, Bld: 121 mg/dL — ABNORMAL HIGH (ref 70–99)
Potassium: 3.8 mmol/L (ref 3.5–5.1)
Sodium: 137 mmol/L (ref 135–145)
Total Bilirubin: 1 mg/dL (ref 0.3–1.2)
Total Protein: 4.9 g/dL — ABNORMAL LOW (ref 6.5–8.1)

## 2021-12-30 LAB — CBC WITH DIFFERENTIAL/PLATELET
Abs Immature Granulocytes: 0.01 10*3/uL (ref 0.00–0.07)
Basophils Absolute: 0 10*3/uL (ref 0.0–0.1)
Basophils Relative: 1 %
Eosinophils Absolute: 0.1 10*3/uL (ref 0.0–0.5)
Eosinophils Relative: 3 %
HCT: 30.1 % — ABNORMAL LOW (ref 39.0–52.0)
Hemoglobin: 10 g/dL — ABNORMAL LOW (ref 13.0–17.0)
Immature Granulocytes: 0 %
Lymphocytes Relative: 18 %
Lymphs Abs: 0.5 10*3/uL — ABNORMAL LOW (ref 0.7–4.0)
MCH: 32.4 pg (ref 26.0–34.0)
MCHC: 33.2 g/dL (ref 30.0–36.0)
MCV: 97.4 fL (ref 80.0–100.0)
Monocytes Absolute: 0.5 10*3/uL (ref 0.1–1.0)
Monocytes Relative: 15 %
Neutro Abs: 1.9 10*3/uL (ref 1.7–7.7)
Neutrophils Relative %: 63 %
Platelets: 128 10*3/uL — ABNORMAL LOW (ref 150–400)
RBC: 3.09 MIL/uL — ABNORMAL LOW (ref 4.22–5.81)
RDW: 18.5 % — ABNORMAL HIGH (ref 11.5–15.5)
WBC: 3 10*3/uL — ABNORMAL LOW (ref 4.0–10.5)
nRBC: 0 % (ref 0.0–0.2)

## 2021-12-30 LAB — GLUCOSE, CAPILLARY: Glucose-Capillary: 188 mg/dL — ABNORMAL HIGH (ref 70–99)

## 2021-12-30 LAB — PHOSPHORUS: Phosphorus: 2.6 mg/dL (ref 2.5–4.6)

## 2021-12-30 LAB — FERRITIN: Ferritin: 1607 ng/mL — ABNORMAL HIGH (ref 24–336)

## 2021-12-30 LAB — MAGNESIUM: Magnesium: 2 mg/dL (ref 1.7–2.4)

## 2021-12-30 MED ORDER — SUCRALFATE 1 G PO TABS: 1.0000 g | ORAL_TABLET | Freq: Three times a day (TID) | ORAL | 0 refills | Status: DC

## 2021-12-30 MED ORDER — SODIUM CHLORIDE 0.9 % IV SOLN
510.0000 mg | Freq: Once | INTRAVENOUS | Status: AC
  Administered 2021-12-30: 510 mg via INTRAVENOUS
  Filled 2021-12-30: qty 17

## 2021-12-30 MED ORDER — PANTOPRAZOLE SODIUM 40 MG PO TBEC: 40.0000 mg | DELAYED_RELEASE_TABLET | Freq: Two times a day (BID) | ORAL | 0 refills | Status: AC

## 2021-12-30 NOTE — Plan of Care (Signed)

## 2021-12-30 NOTE — Plan of Care (Signed)

## 2021-12-30 NOTE — Discharge Summary (Addendum)
Physician Discharge Summary  ?Jimmy Blankenship YDX:[id number] DOB: 1950/11/02 DOA: 12/28/2021 ? ?PCP: Jimmy Holstein, MD ? ?Admit date: 12/28/2021 ?Discharge date: 12/30/2021 ? ?Time spent: 40 minutes ? ?Recommendations for Outpatient Follow-up:  ?Follow outpatient CBC/CMP  ?Follow GI bleeding with GI outpatient ?Follow pending biopsy results ?Continue to hold xarelto  ?Follow iron def outpatient ? ?Discharge Diagnoses:  ?Principal Problem: ?  Acute upper gastrointestinal bleeding ?Active Problems: ?  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[place]) ?  Iron deficiency anemia due to chronic blood loss ?  Left ventricular thrombus ?  Coronary artery disease involving native coronary artery of native heart without angina pectoris ?  Chronic combined systolic and diastolic congestive heart failure ([place]) ?  Controlled type 2 diabetes mellitus with diabetic polyneuropathy, with long-term current use of insulin ([place]) ?  Mixed diabetic hyperlipidemia associated with type 2 diabetes mellitus ([place]) ?  Essential hypertension ?  BPH (benign prostatic hyperplasia) ? ? ?Discharge Condition: stable ? ?Diet recommendation: heart healthy, diabetic ? ?Filed Weights  ? 12/28/21 1900  ?Weight: 64 kg  ? ? 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 ?  ?Patient presents to [facility] emergency department today at the direction of Dr. Bryan Lemma with gastroenterology due to ongoing melena and weakness.  ? ?Now s/p EGD with gastroenterology.  EGD  showed nodular mucosa in the gastric body, in the lesser curvature of the stomach and in the incisura, as well as Jimmy Blankenship single duodenal polyp.  Biopsies are pending at the time of discharge.  GI recommended PPI BID, sucralfate x2 weeks, and continuing to hold xarelto.   ? ?See below for additional details ? ?Hospital Course:  ?Assessment and Plan: ?* Acute upper gastrointestinal bleeding ?Patient presenting with several week history of melena, bright red blood per rectum and generalized weakness ?Now s/p EGD, nodular mucosa in gastric body, in lesser curvature of the stomach and in the incisura.  Single duodenal polyp.  Biopsied. ?GI recommended following up path, PPI BID, sucralfate 1 g PO QID x2 weeks, recommending holding xarelto.  If bleeding in future, consider IR embolization. ? ?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[place]) ?Diagnosed 08/2020 ?Metastatic disease to the liver ?Complicated by left ventricular thrombus and now more recently upper gastrointestinal bleeding ?Still receiving ongoing chemotherapy treatments every 2 weeks, last dose on 4/5. ?Dr. Marin Olp following, appreciate assistance ? ?Left ventricular thrombus ?Diagnosed via echocardiogram 06/2021 ?Was placed on Xarelto at that time ?Xarelto has been held since 4/14 due to recurrent bouts of bleeding.  Patient received Lequita Meadowcroft total of 6 months of anticoagulation up to this point. ?Will continue to hold anticoagulation given his recent GI bleeding ? ?Iron deficiency anemia due to chronic blood loss ?IV iron on day of discharge per Dr. Marin Olp ?Follow with Dr. Marin Olp outpatient ? ?Coronary artery disease involving native coronary artery of native heart without angina pectoris ?Monitoring patient on telemetry ?Currently holding all forms of anticoagulation ?Continuing home regimen of lipid lowering therapy and AV nodal blocking therapy ? ? ?Chronic combined systolic and diastolic congestive heart failure ([place]) ?Euvolemic, follow ? ? ?Controlled type 2  diabetes mellitus with diabetic polyneuropathy, with long-term current use of insulin ([place]) ?Resume home meds ?A1c 7 ? ?Mixed diabetic hyperlipidemia associated with type 2 diabetes mellitus ([place]) ?Continuing home regimen of lipid lowering therapy. ? ? ?Essential hypertension ?Resume patients home regimen of metoprolol ? ? ? ? ? ?BPH (benign prostatic hyperplasia) ?Continue home regimen of Flomax. ? ? ?Procedures: ?EGD  ? ?Consultations: ?GI ?oncology ? ?Discharge Exam: ?Vitals:  ? 12/30/21 0426 12/30/21 0600  ?BP: 99/67 116/72  ?Pulse: 83   ?Resp: 18   ?Temp: 99 ?F (37.2 ?C)   ?SpO2: 96%   ? ?No new bleeding ?Had BM this AM without black ?Discussed discharge plan and recs ?[name] interpreter used ? ?General: No acute distress. ?Cardiovascular: RRR ?Lungs: unlabored ?Abdomen: Soft, nontender, nondistended ?Neurological: Alert and oriented ?3. Moves all extremities ?4 . Cranial nerves II through XII grossly intact. ?Skin: Warm and dry. No rashes or lesions. ?Extremities: No clubbing or cyanosis. No edema.  ? ?Discharge Instructions ? ? ?Discharge Instructions   ? ? Call MD for:  difficulty breathing, headache or visual disturbances   Complete by: As directed ?  ? Call MD for:  extreme fatigue   Complete by: As directed ?  ? Call MD for:  hives   Complete by: As directed ?  ? Call MD for:  persistant dizziness or light-headedness   Complete by: As directed ?  ? Call MD for:  persistant nausea and vomiting   Complete by: As directed ?  ? Call MD for:  redness, tenderness, or signs of infection (pain, swelling, redness, odor or green/yellow discharge around incision site)   Complete by: As directed ?  ? Call MD for:  severe uncontrolled pain   Complete by: As directed ?  ? Call MD for:  temperature >100.4   Complete by: As directed ?  ? Diet - low sodium heart healthy   Complete by: As directed ?  ? Discharge instructions   Complete by: As directed ?  ? You were seen for gastrointestinal bleeding. ? ?You had an EGD  which was not revealing of Julena Barbour cause.  You have biopsies that are pending and should be followed up with [facility] as an outpatient.  Gastroenterology is recommending protonix twice daily and sucralfate for 2 weeks.  You should continue to hold your xarelto (do NOT take the xarelto).  If you have bleeding in the future, you may need Jeremian Whitby procedure with the interventional radiologists. ? ?We sent sucralfate pills instead of liquid (because the liquid cost 100 dollars and the pills cost 5 dollars).  Try to blend the pills into [place] slurry with some water.  If you're having issues with this, you can request Sayge Brienza prescription for the liquid from you gastroenterologist. ? ?Return for new, recurrent, or worsening symptoms. ? ?Please ask your PCP to request records from this hospitalization so they know what was done and what the next steps will be.  ? Increase activity slowly   Complete by: As directed ?  ? No wound care   Complete by: As directed ?  ? ?  ? ?Allergies as of 12/30/2021   ?No Known Allergies ?  ? ?  ?Medication List  ?  ? ?STOP taking these medications   ? ?bismuth subsalicylate 161 WR/60AV suspension ?Commonly known as: PEPTO BISMOL ?  ?Xarelto 10 MG Tabs tablet ?Generic drug: rivaroxaban ?  ? ?  ? ?TAKE these medications   ? ?atorvastatin 40 MG tablet ?Commonly known  as: LIPITOR ?Take 80 mg by mouth daily. ?What changed: Another medication with the same name was removed. Continue taking this medication, and follow the directions you see here. ?  ?dexamethasone 4 MG tablet ?Commonly known as: DECADRON ?Take 2 tablets (8 mg total) by mouth daily. Start the day after chemotherapy for 2 days. ?  ?diphenoxylate-atropine 2.5-0.025 MG tablet ?Commonly known as: LOMOTIL ?TAKE ONE TABLET BY MOUTH FOUR TIMES Yareth Macdonnell DAY AS NEEDED FOR DIARRHEA OR LOOSE STOOLS ?What changed:  ?how much to take ?how to take this ?when to take this ?reasons to take this ?additional instructions ?  ?feeding supplement Liqd ?Take 237 mLs by  mouth 2 (two) times daily between meals. ?  ?gabapentin 300 MG capsule ?Commonly known as: NEURONTIN ?Take 1 capsule (300 mg total) by mouth 4 (four) times daily. ?What changed: when to take this ?  ?glipiZIDE

## 2021-12-30 NOTE — Progress Notes (Signed)
Jimmy Blankenship had his endoscopy yesterday.  There does not appear to be any obvious bleeding from what I can tell from the report.  He had the mass along the lesser curvature of the stomach.  It did not look like there is any bleeding from that mass. ? ?His hemoglobin is holding steady.  Hemoglobin is 10.  White cell count 3.  Platelet count 128,000. ? ?I do not think would be a bad idea to give him some IV iron.  I am sure his iron is probably on the lower side. ? ?I know that the Enhertu that he is getting can cause some bleeding.  Again, there is nothing that I can see that looks like bleeding from the mass.  By the CT scan that he had done when he came in, the mass had improved.  He has responded to the Enhertu. ? ?Hopefully, he will be able to go home today. ? ?His vital signs show temperature of 99.  Pulse 83.  Blood pressure 116/72.  His head and neck exam shows no ocular or oral lesions.  He has no adenopathy in the neck.  Lungs are clear.  Cardiac exam regular rate and rhythm.  Abdomen is soft.  Bowel sounds are present.  There is no tenderness to palpation.  There is no abdominal mass.  There is no fluid wave.  There is no palpable hepatomegaly.  Extremity shows no clubbing, cyanosis or edema. ? ?Again, I am not sure where the bleeding could be coming from.  I think we will just have to watch this.  I agree with Dr. Steve Rattler recommendations. ? ?I am sure that we will get him back into the office next week for follow-up. ? ?I know that he is gotten incredible care from all the staff on [address]. ? ?Lattie Haw, MD ? ?1 Chronicles 16:11 ? ? ?

## 2021-12-30 NOTE — Progress Notes (Signed)
Pharmacy Brief Note -  ? ?Order placed for feraheme 510 mg IV once this morning. Pt received venofer 200 mg IV dose on 12/26/21. Confirmed with Dr. Marin Olp that he would like to proceed with another dose of IV iron this morning. ? ?Lenis Noon, PharmD ?12/30/21 ?8:15 AM ? ? ? ?

## 2021-12-30 NOTE — Assessment & Plan Note (Signed)
IV iron on day of discharge per Dr. Marin Olp ?Follow with Dr. Marin Olp outpatient ?

## 2022-01-01 ENCOUNTER — Encounter (HOSPITAL_COMMUNITY): Payer: Self-pay | Admitting: Gastroenterology

## 2022-01-01 ENCOUNTER — Other Ambulatory Visit: Payer: Self-pay | Admitting: Hematology & Oncology

## 2022-01-01 DIAGNOSIS — E876 Hypokalemia: Secondary | ICD-10-CM

## 2022-01-02 LAB — SURGICAL PATHOLOGY

## 2022-01-03 ENCOUNTER — Inpatient Hospital Stay: Payer: Medicare Other

## 2022-01-03 ENCOUNTER — Encounter: Payer: Self-pay | Admitting: Family

## 2022-01-03 ENCOUNTER — Inpatient Hospital Stay (HOSPITAL_BASED_OUTPATIENT_CLINIC_OR_DEPARTMENT_OTHER): Payer: Medicare Other | Admitting: Family

## 2022-01-03 VITALS — BP 113/75 | HR 79 | Temp 97.8°F | Resp 18 | Wt 140.4 lb

## 2022-01-03 DIAGNOSIS — C165 Malignant neoplasm of lesser curvature of stomach, unspecified: Secondary | ICD-10-CM

## 2022-01-03 DIAGNOSIS — K922 Gastrointestinal hemorrhage, unspecified: Secondary | ICD-10-CM

## 2022-01-03 DIAGNOSIS — D500 Iron deficiency anemia secondary to blood loss (chronic): Secondary | ICD-10-CM | POA: Diagnosis not present

## 2022-01-03 DIAGNOSIS — D649 Anemia, unspecified: Secondary | ICD-10-CM

## 2022-01-03 DIAGNOSIS — C169 Malignant neoplasm of stomach, unspecified: Secondary | ICD-10-CM

## 2022-01-03 DIAGNOSIS — C799 Secondary malignant neoplasm of unspecified site: Secondary | ICD-10-CM | POA: Diagnosis not present

## 2022-01-03 DIAGNOSIS — E876 Hypokalemia: Secondary | ICD-10-CM

## 2022-01-03 DIAGNOSIS — Z5112 Encounter for antineoplastic immunotherapy: Secondary | ICD-10-CM | POA: Diagnosis not present

## 2022-01-03 LAB — CBC WITH DIFFERENTIAL (CANCER CENTER ONLY)
Abs Immature Granulocytes: 0.09 10*3/uL — ABNORMAL HIGH (ref 0.00–0.07)
Basophils Absolute: 0.1 10*3/uL (ref 0.0–0.1)
Basophils Relative: 1 %
Eosinophils Absolute: 0.1 10*3/uL (ref 0.0–0.5)
Eosinophils Relative: 3 %
HCT: 32.2 % — ABNORMAL LOW (ref 39.0–52.0)
Hemoglobin: 11 g/dL — ABNORMAL LOW (ref 13.0–17.0)
Immature Granulocytes: 2 %
Lymphocytes Relative: 14 %
Lymphs Abs: 0.7 10*3/uL (ref 0.7–4.0)
MCH: 33.2 pg (ref 26.0–34.0)
MCHC: 34.2 g/dL (ref 30.0–36.0)
MCV: 97.3 fL (ref 80.0–100.0)
Monocytes Absolute: 0.8 10*3/uL (ref 0.1–1.0)
Monocytes Relative: 16 %
Neutro Abs: 3.3 10*3/uL (ref 1.7–7.7)
Neutrophils Relative %: 64 %
Platelet Count: 162 10*3/uL (ref 150–400)
RBC: 3.31 MIL/uL — ABNORMAL LOW (ref 4.22–5.81)
RDW: 19.1 % — ABNORMAL HIGH (ref 11.5–15.5)
WBC Count: 5.1 10*3/uL (ref 4.0–10.5)
nRBC: 0 % (ref 0.0–0.2)

## 2022-01-03 LAB — CMP (CANCER CENTER ONLY)
ALT: 28 U/L (ref 0–44)
AST: 29 U/L (ref 15–41)
Albumin: 3.1 g/dL — ABNORMAL LOW (ref 3.5–5.0)
Alkaline Phosphatase: 173 U/L — ABNORMAL HIGH (ref 38–126)
Anion gap: 9 (ref 5–15)
BUN: 9 mg/dL (ref 8–23)
CO2: 21 mmol/L — ABNORMAL LOW (ref 22–32)
Calcium: 8.8 mg/dL — ABNORMAL LOW (ref 8.9–10.3)
Chloride: 106 mmol/L (ref 98–111)
Creatinine: 1.01 mg/dL (ref 0.61–1.24)
GFR, Estimated: >60 mL/min (ref >60)
Glucose, Bld: 151 mg/dL — ABNORMAL HIGH (ref 70–99)
Potassium: 3.7 mmol/L (ref 3.5–5.1)
Sodium: 136 mmol/L (ref 135–145)
Total Bilirubin: 1.1 mg/dL (ref 0.3–1.2)
Total Protein: 5.6 g/dL — ABNORMAL LOW (ref 6.5–8.1)

## 2022-01-03 LAB — IRON AND IRON BINDING CAPACITY (CC-WL,HP ONLY)
Iron: 96 ug/dL (ref 45–182)
Saturation Ratios: 35 % (ref 17.9–39.5)
TIBC: 279 ug/dL (ref 250–450)
UIBC: 183 ug/dL (ref 117–376)

## 2022-01-03 LAB — LACTATE DEHYDROGENASE: LDH: 238 U/L — ABNORMAL HIGH (ref 98–192)

## 2022-01-03 LAB — FERRITIN: Ferritin: 2245 ng/mL — ABNORMAL HIGH (ref 24–336)

## 2022-01-03 MED ORDER — SODIUM CHLORIDE 0.9 % IV SOLN
10.0000 mg | Freq: Once | INTRAVENOUS | Status: AC
  Administered 2022-01-03: 10 mg via INTRAVENOUS
  Filled 2022-01-03: qty 10

## 2022-01-03 MED ORDER — SODIUM CHLORIDE 0.9% FLUSH
10.0000 mL | INTRAVENOUS | Status: DC | PRN
  Administered 2022-01-03: 10 mL

## 2022-01-03 MED ORDER — FAM-TRASTUZUMAB DERUXTECAN-NXKI CHEMO 100 MG IV SOLR
6.2500 mg/kg | Freq: Once | INTRAVENOUS | Status: AC
  Administered 2022-01-03: 400 mg via INTRAVENOUS
  Filled 2022-01-03: qty 20

## 2022-01-03 MED ORDER — DIPHENHYDRAMINE HCL 25 MG PO CAPS
50.0000 mg | ORAL_CAPSULE | Freq: Once | ORAL | Status: AC
  Administered 2022-01-03: 50 mg via ORAL
  Filled 2022-01-03: qty 2

## 2022-01-03 MED ORDER — HEPARIN SOD (PORK) LOCK FLUSH 100 UNIT/ML IV SOLN
500.0000 [IU] | Freq: Once | INTRAVENOUS | Status: AC | PRN
  Administered 2022-01-03: 500 [IU]

## 2022-01-03 MED ORDER — DEXTROSE 5 % IV SOLN: Freq: Once | INTRAVENOUS | Status: AC

## 2022-01-03 MED ORDER — ACETAMINOPHEN 325 MG PO TABS
650.0000 mg | ORAL_TABLET | Freq: Once | ORAL | Status: AC
  Administered 2022-01-03: 650 mg via ORAL
  Filled 2022-01-03: qty 2

## 2022-01-03 MED ORDER — PALONOSETRON HCL INJECTION 0.25 MG/5ML
0.2500 mg | Freq: Once | INTRAVENOUS | Status: AC
  Administered 2022-01-03: 0.25 mg via INTRAVENOUS
  Filled 2022-01-03: qty 5

## 2022-01-03 NOTE — Patient Instructions (Signed)

## 2022-01-03 NOTE — Progress Notes (Signed)
?Hematology and Oncology Follow Up Visit ? ?Jimmy Blankenship ?[id number] ?1951/05/06 71 y.o. ?01/03/2022 ? ? 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 ?Past Therapy: ?FOLFOX/Nivolumab/Herceptin -- s/p cycle #8 --started on 09/19/2020 -- d/c on 01/26/2021 ?  ?Current Therapy:        ? ?Enhertu -- IV q 3 weeks -- started 02/01/2021, s/p cycle 13 ?IV iron as indicated-last dose given on 10/05/2020 ?Xarelto 10 mg po q day -- start on 03/02/2021 and dose reduced to maintenance on 03/29/2021 - on hold due to GI bleed ?  ?Interim History:  Jimmy Blankenship is here today with his interpretor for follow-up and treatment.  ?He was hospitalized a week ago for GI bleed and associated anemia. EGD showed nodular mucosa in the gastric body, curvature of the stomach and incisura as well as a duodenal polyp. Biopsies showed peptic duodenitis.  ?He has not noted any more blood in his stool since discharge. His Hgb is up to 11.0. Xarelto is still on hold.   ?No bruising or petechiae.  ?He denies fatigue at this time.  ?No fever, chills, n/v, cough, rash, dizziness, SOB, chest pain, palpitations, abdominal pain or changes in bowel or bladder habits.  ?No swelling, tenderness in his extremities.  ?The neuropathy in his fingers and toes in unchanged from baseline.  ?No falls or syncope to report.  ?His appetite is good right now and he is doing his best to stay well hydrated. His weight is stable at 140 lbs.  ? ?ECOG Performance Status: 1 - Symptomatic but completely ambulatory ? ?Medications:  ?Allergies as of 01/03/2022   ?No Known Allergies ?  ? ?  ?Medication List  ?  ? ?  ? Accurate as of January 03, 2022  8:39 AM. If you have any questions, ask your nurse or doctor.  ?  ?  ? ?  ? ?atorvastatin 40 MG tablet ?Commonly known as: LIPITOR ?Take 80 mg by mouth daily. ?  ?dexamethasone 4 MG tablet ?Commonly known as:  DECADRON ?Take 2 tablets (8 mg total) by mouth daily. Start the day after chemotherapy for 2 days. ?  ?diphenoxylate-atropine 2.5-0.025 MG tablet ?Commonly known as: LOMOTIL ?TAKE ONE TABLET BY MOUTH FOUR TIMES A DAY AS NEEDED FOR DIARRHEA OR LOOSE STOOLS ?What changed:  ?how much to take ?how to take this ?when to take this ?reasons to take this ?additional instructions ?  ?feeding supplement Liqd ?Take 237 mLs by mouth 2 (two) times daily between meals. ?  ?gabapentin 300 MG capsule ?Commonly known as: NEURONTIN ?Take 1 capsule (300 mg total) by mouth 4 (four) times daily. ?What changed: when to take this ?  ?glipiZIDE 5 MG tablet ?Commonly known as: GLUCOTROL ?Take 5 mg by mouth 2 (two) times daily before a meal. ?  ?insulin glargine 100 unit/mL Sopn ?Commonly known as: LANTUS ?Inject 10 Units into the skin daily as needed (for blood sugar over 200). ?  ?lidocaine-prilocaine cream ?Commonly known as: EMLA ?Apply to affected area once ?What changed:  ?how much to take ?when to take this ?reasons to take this ?additional instructions ?  ?LORazepam 0.5 MG tablet ?Commonly known as: Ativan ?Take 1 tablet (0.5 mg total) by mouth every 6 (six) hours as needed (Nausea or vomiting). ?  ?megestrol 400 MG/10ML suspension ?Commonly known as: MEGACE ?Take 10 mLs (400 mg total) by mouth 2 (two) times daily. ?  ?metFORMIN  1000 MG tablet ?Commonly known as: GLUCOPHAGE ?Take 1,000 mg by mouth 2 (two) times daily with a meal. ?  ?metoprolol tartrate 50 MG tablet ?Commonly known as: LOPRESSOR ?TAKE ONE TABLET BY MOUTH TWICE A DAY ?  ?ondansetron 8 MG tablet ?Commonly known as: Zofran ?Take 1 tablet (8 mg total) by mouth 2 (two) times daily as needed for refractory nausea / vomiting. Start on day 3 after chemo. ?  ?pantoprazole 40 MG tablet ?Commonly known as: PROTONIX ?Take 1 tablet (40 mg total) by mouth 2 (two) times daily. Follow up with gastroenterology for additional instructions ?  ?potassium chloride SA 20 MEQ  tablet ?Commonly known as: Klor-Con M20 ?Take 1 tablet (20 mEq total) by mouth daily. ?  ?prochlorperazine 10 MG tablet ?Commonly known as: COMPAZINE ?Take 1 tablet (10 mg total) by mouth every 6 (six) hours as needed (Nausea or vomiting). ?What changed: reasons to take this ?  ?sucralfate 1 g tablet ?Commonly known as: CARAFATE ?Take 1 tablet (1 g total) by mouth 4 (four) times daily -  with meals and at bedtime for 14 days. ?  ?tamsulosin 0.4 MG Caps capsule ?Commonly known as: FLOMAX ?Take 0.4 mg by mouth at bedtime. ?  ? ?  ? ? ?Allergies: No Known Allergies ? ?Past Medical History, Surgical history, Social history, and Family History were reviewed and updated. ? ?Review of Systems: ?All other 10 point review of systems is negative.  ? ?Physical Exam: ? weight is 140 lb 6.4 oz (63.7 kg). His oral temperature is 97.8 ?F (36.6 ?C). His blood pressure is 113/75 and his pulse is 79. His respiration is 18 and oxygen saturation is 100%.  ? ?Wt Readings from Last 3 Encounters:  ?01/03/22 140 lb 6.4 oz (63.7 kg)  ?01/03/22 140 lb 0.4 oz (63.5 kg)  ?12/28/21 141 lb (64 kg)  ? ? ?Ocular: Sclerae unicteric, pupils equal, round and reactive to light ?Ear-nose-throat: Oropharynx clear, dentition fair ?Lymphatic: No cervical or supraclavicular adenopathy ?Lungs no rales or rhonchi, good excursion bilaterally ?Heart regular rate and rhythm, no murmur appreciated ?Abd soft, nontender, positive bowel sounds ?MSK no focal spinal tenderness, no joint edema ?Neuro: non-focal, well-oriented, appropriate affect ?Breasts: Deferred  ? ?Lab Results  ?Component Value Date  ? WBC 5.1 01/03/2022  ? HGB 11.0 (L) 01/03/2022  ? HCT 32.2 (L) 01/03/2022  ? MCV 97.3 01/03/2022  ? PLT 162 01/03/2022  ? ?Lab Results  ?Component Value Date  ? FERRITIN 1,607 (H) 12/30/2021  ? IRON 43 (L) 12/30/2021  ? TIBC 238 (L) 12/30/2021  ? UIBC 195 12/30/2021  ? IRONPCTSAT 18 12/30/2021  ? ?Lab Results  ?Component Value Date  ? RETICCTPCT 3.0 06/28/2021  ? RBC  3.31 (L) 01/03/2022  ? ?No results found for: KPAFRELGTCHN, LAMBDASER, KAPLAMBRATIO ?No results found for: IGGSERUM, IGA, IGMSERUM ?No results found for: TOTALPROTELP, ALBUMINELP, A1GS, A2GS, BETS, BETA2SER, GAMS, MSPIKE, SPEI ?  Chemistry   ?   ?Component Value Date/Time  ? NA 137 12/30/2021 0454  ? NA 140 06/10/2020 0935  ? K 3.8 12/30/2021 0454  ? CL 112 (H) 12/30/2021 0454  ? CO2 20 (L) 12/30/2021 0454  ? BUN 10 12/30/2021 0454  ? BUN 17 06/10/2020 0935  ? CREATININE 0.95 12/30/2021 0454  ? CREATININE 0.92 12/13/2021 1010  ?    ?Component Value Date/Time  ? CALCIUM 8.3 (L) 12/30/2021 0454  ? ALKPHOS 164 (H) 12/30/2021 0454  ? AST 39 12/30/2021 0454  ? AST 19 12/13/2021 1010  ?  ALT 35 12/30/2021 0454  ? ALT 20 12/13/2021 1010  ? BILITOT 1.0 12/30/2021 0454  ? BILITOT 1.8 (H) 12/13/2021 1010  ?  ? ? ? ?Impression and Plan: Mr. Reading is a very pleasant 71 yo [name] gentleman with metastatic adenocarcinoma of the stomach. He completed 7 cycles of chemotherapy along with immunotherapy and targeted therapy. He then began to have some disease progression and was having a hard time tolerating treatment.  ?He is doing well tolerating Enhurtu. We will proceed with treatment today as planned.  ?Iron studies are pending.  ?He will continue to hold his Xarelto for now per MD due to recurrent GI bleed.  ?Follow-up in 3 weeks.  ? ?Lottie Dawson, NP ?4/26/20238:39 AM ? ?

## 2022-01-11 ENCOUNTER — Telehealth: Payer: Self-pay | Admitting: *Deleted

## 2022-01-11 NOTE — Telephone Encounter (Signed)
Per 01/03/22 los - called and gave upcoming appointments - confirmed ?

## 2022-01-24 ENCOUNTER — Inpatient Hospital Stay: Payer: Medicare Other | Admitting: Family

## 2022-01-24 ENCOUNTER — Encounter: Payer: Self-pay | Admitting: Family

## 2022-01-24 ENCOUNTER — Inpatient Hospital Stay: Payer: Medicare Other | Attending: Hematology & Oncology

## 2022-01-24 ENCOUNTER — Inpatient Hospital Stay: Payer: Medicare Other

## 2022-01-24 VITALS — BP 123/71 | HR 72 | Temp 97.8°F | Resp 16 | Wt 147.0 lb

## 2022-01-24 DIAGNOSIS — Z8719 Personal history of other diseases of the digestive system: Secondary | ICD-10-CM | POA: Insufficient documentation

## 2022-01-24 DIAGNOSIS — Z7901 Long term (current) use of anticoagulants: Secondary | ICD-10-CM | POA: Insufficient documentation

## 2022-01-24 DIAGNOSIS — K5900 Constipation, unspecified: Secondary | ICD-10-CM | POA: Diagnosis not present

## 2022-01-24 DIAGNOSIS — C165 Malignant neoplasm of lesser curvature of stomach, unspecified: Secondary | ICD-10-CM | POA: Diagnosis present

## 2022-01-24 DIAGNOSIS — C799 Secondary malignant neoplasm of unspecified site: Secondary | ICD-10-CM

## 2022-01-24 DIAGNOSIS — D500 Iron deficiency anemia secondary to blood loss (chronic): Secondary | ICD-10-CM | POA: Diagnosis not present

## 2022-01-24 DIAGNOSIS — R109 Unspecified abdominal pain: Secondary | ICD-10-CM | POA: Insufficient documentation

## 2022-01-24 DIAGNOSIS — C787 Secondary malignant neoplasm of liver and intrahepatic bile duct: Secondary | ICD-10-CM | POA: Insufficient documentation

## 2022-01-24 DIAGNOSIS — Z5112 Encounter for antineoplastic immunotherapy: Secondary | ICD-10-CM | POA: Diagnosis present

## 2022-01-24 DIAGNOSIS — C7951 Secondary malignant neoplasm of bone: Secondary | ICD-10-CM | POA: Insufficient documentation

## 2022-01-24 DIAGNOSIS — R0602 Shortness of breath: Secondary | ICD-10-CM | POA: Diagnosis not present

## 2022-01-24 DIAGNOSIS — K922 Gastrointestinal hemorrhage, unspecified: Secondary | ICD-10-CM | POA: Diagnosis not present

## 2022-01-24 DIAGNOSIS — R12 Heartburn: Secondary | ICD-10-CM | POA: Diagnosis not present

## 2022-01-24 DIAGNOSIS — C169 Malignant neoplasm of stomach, unspecified: Secondary | ICD-10-CM

## 2022-01-24 DIAGNOSIS — K921 Melena: Secondary | ICD-10-CM | POA: Diagnosis not present

## 2022-01-24 DIAGNOSIS — M7989 Other specified soft tissue disorders: Secondary | ICD-10-CM | POA: Diagnosis not present

## 2022-01-24 DIAGNOSIS — Z79899 Other long term (current) drug therapy: Secondary | ICD-10-CM | POA: Diagnosis not present

## 2022-01-24 DIAGNOSIS — R5383 Other fatigue: Secondary | ICD-10-CM | POA: Diagnosis not present

## 2022-01-24 LAB — CBC WITH DIFFERENTIAL (CANCER CENTER ONLY)
Abs Immature Granulocytes: 0.03 10*3/uL (ref 0.00–0.07)
Basophils Absolute: 0 10*3/uL (ref 0.0–0.1)
Basophils Relative: 1 %
Eosinophils Absolute: 0.1 10*3/uL (ref 0.0–0.5)
Eosinophils Relative: 3 %
HCT: 28.9 % — ABNORMAL LOW (ref 39.0–52.0)
Hemoglobin: 9.7 g/dL — ABNORMAL LOW (ref 13.0–17.0)
Immature Granulocytes: 1 %
Lymphocytes Relative: 15 %
Lymphs Abs: 0.7 10*3/uL (ref 0.7–4.0)
MCH: 34.4 pg — ABNORMAL HIGH (ref 26.0–34.0)
MCHC: 33.6 g/dL (ref 30.0–36.0)
MCV: 102.5 fL — ABNORMAL HIGH (ref 80.0–100.0)
Monocytes Absolute: 0.7 10*3/uL (ref 0.1–1.0)
Monocytes Relative: 14 %
Neutro Abs: 3.1 10*3/uL (ref 1.7–7.7)
Neutrophils Relative %: 66 %
Platelet Count: 128 10*3/uL — ABNORMAL LOW (ref 150–400)
RBC: 2.82 MIL/uL — ABNORMAL LOW (ref 4.22–5.81)
RDW: 21.8 % — ABNORMAL HIGH (ref 11.5–15.5)
WBC Count: 4.6 10*3/uL (ref 4.0–10.5)
nRBC: 0 % (ref 0.0–0.2)

## 2022-01-24 LAB — CMP (CANCER CENTER ONLY)
ALT: 28 U/L (ref 0–44)
AST: 32 U/L (ref 15–41)
Albumin: 3.3 g/dL — ABNORMAL LOW (ref 3.5–5.0)
Alkaline Phosphatase: 185 U/L — ABNORMAL HIGH (ref 38–126)
Anion gap: 9 (ref 5–15)
BUN: 12 mg/dL (ref 8–23)
CO2: 21 mmol/L — ABNORMAL LOW (ref 22–32)
Calcium: 9 mg/dL (ref 8.9–10.3)
Chloride: 111 mmol/L (ref 98–111)
Creatinine: 0.86 mg/dL (ref 0.61–1.24)
GFR, Estimated: >60 mL/min (ref >60)
Glucose, Bld: 71 mg/dL (ref 70–99)
Potassium: 3.4 mmol/L — ABNORMAL LOW (ref 3.5–5.1)
Sodium: 141 mmol/L (ref 135–145)
Total Bilirubin: 1.2 mg/dL (ref 0.3–1.2)
Total Protein: 5.6 g/dL — ABNORMAL LOW (ref 6.5–8.1)

## 2022-01-24 LAB — FERRITIN: Ferritin: 1707 ng/mL — ABNORMAL HIGH (ref 24–336)

## 2022-01-24 LAB — LACTATE DEHYDROGENASE: LDH: 217 U/L — ABNORMAL HIGH (ref 98–192)

## 2022-01-24 MED ORDER — LACTULOSE 10 GM/15ML PO SOLN: 10.0000 g | Freq: Two times a day (BID) | ORAL | 0 refills | Status: DC | PRN

## 2022-01-24 MED ORDER — SODIUM CHLORIDE 0.9% FLUSH
10.0000 mL | INTRAVENOUS | Status: DC | PRN
  Administered 2022-01-24: 10 mL

## 2022-01-24 MED ORDER — ACETAMINOPHEN 325 MG PO TABS
650.0000 mg | ORAL_TABLET | Freq: Once | ORAL | Status: AC
  Administered 2022-01-24: 650 mg via ORAL
  Filled 2022-01-24: qty 2

## 2022-01-24 MED ORDER — DIPHENHYDRAMINE HCL 25 MG PO CAPS
50.0000 mg | ORAL_CAPSULE | Freq: Once | ORAL | Status: AC
  Administered 2022-01-24: 50 mg via ORAL
  Filled 2022-01-24: qty 2

## 2022-01-24 MED ORDER — FAM-TRASTUZUMAB DERUXTECAN-NXKI CHEMO 100 MG IV SOLR
6.2500 mg/kg | Freq: Once | INTRAVENOUS | Status: AC
  Administered 2022-01-24: 400 mg via INTRAVENOUS
  Filled 2022-01-24: qty 20

## 2022-01-24 MED ORDER — DEXTROSE 5 % IV SOLN: Freq: Once | INTRAVENOUS | Status: AC

## 2022-01-24 MED ORDER — PALONOSETRON HCL INJECTION 0.25 MG/5ML
0.2500 mg | Freq: Once | INTRAVENOUS | Status: AC
  Administered 2022-01-24: 0.25 mg via INTRAVENOUS
  Filled 2022-01-24: qty 5

## 2022-01-24 MED ORDER — HEPARIN SOD (PORK) LOCK FLUSH 100 UNIT/ML IV SOLN
500.0000 [IU] | Freq: Once | INTRAVENOUS | Status: AC | PRN
  Administered 2022-01-24: 500 [IU]

## 2022-01-24 MED ORDER — SODIUM CHLORIDE 0.9 % IV SOLN
10.0000 mg | Freq: Once | INTRAVENOUS | Status: AC
  Administered 2022-01-24: 10 mg via INTRAVENOUS
  Filled 2022-01-24: qty 10

## 2022-01-24 NOTE — Patient Instructions (Addendum)
[facility] [place]  Discharge Instructions: ?Thank you for choosing [facility] to provide your oncology and hematology care.  ? ?If you have a lab appointment with the [facility], please go directly to the [facility] and check in at the registration area. ? ?Wear comfortable clothing and clothing appropriate for easy access to any Portacath or PICC line.  ? ?We strive to give you quality time with your provider. You may need to reschedule your appointment if you arrive late (15 or more minutes).  Arriving late affects you and other patients whose appointments are after yours.  Also, if you miss three or more appointments without notifying the office, you may be dismissed from the clinic at the provider?s discretion.    ?  ?For prescription refill requests, have your pharmacy contact our office and allow 72 hours for refills to be completed.   ? ?Today you received the following chemotherapy and/or immunotherapy agents enhurtu ?  ?To help prevent nausea and vomiting after your treatment, we encourage you to take your nausea medication as directed. ? ?BELOW ARE SYMPTOMS THAT SHOULD BE REPORTED IMMEDIATELY: ?*FEVER GREATER THAN 100.4 F (38 ?C) OR HIGHER ?*CHILLS OR SWEATING ?*NAUSEA AND VOMITING THAT IS NOT CONTROLLED WITH YOUR NAUSEA MEDICATION ?*UNUSUAL SHORTNESS OF BREATH ?*UNUSUAL BRUISING OR BLEEDING ?*URINARY PROBLEMS (pain or burning when urinating, or frequent urination) ?*BOWEL PROBLEMS (unusual diarrhea, constipation, pain near the anus) ?TENDERNESS IN MOUTH AND THROAT WITH OR WITHOUT PRESENCE OF ULCERS (sore throat, sores in mouth, or a toothache) ?UNUSUAL RASH, SWELLING OR PAIN  ?UNUSUAL VAGINAL DISCHARGE OR ITCHING  ? ?Items with * indicate a potential emergency and should be followed up as soon as possible or go to the Emergency Department if any problems should occur. ? ?Please show the CHEMOTHERAPY ALERT CARD or IMMUNOTHERAPY ALERT CARD at check-in to the  Emergency Department and triage nurse. ?Should you have questions after your visit or need to cancel or reschedule your appointment, please contact [facility]  [phone] and follow the prompts.  Office hours are 8:00 a.m. to 4:30 p.m. Monday - Friday. Please note that voicemails left after 4:00 p.m. may not be returned until the following business day.  We are closed weekends and major holidays. You have access to a nurse at all times for urgent questions. Please call the main number to the clinic [phone] and follow the prompts. ? ?For any non-urgent questions, you may also contact your provider using MyChart. We now offer e-Visits for anyone 53 and older to request care online for non-urgent symptoms. For details visit [email or web address]. ?  ?Also download the MyChart app! Go to the app store, search "MyChart", open the app, select [facility], and log in with your MyChart username and password. ? ?Due to Covid, a mask is required upon entering the hospital/clinic. If you do not have a mask, one will be given to you upon arrival. For doctor visits, patients may have 1 support person aged 51 or older with them. For treatment visits, patients cannot have anyone with them due to current Covid guidelines and our immunocompromised population.  ?

## 2022-01-24 NOTE — Progress Notes (Signed)
?Hematology and Oncology Follow Up Visit ? ?Jimmy Blankenship ?[id number] ?[birth year] 71 y.o. ?01/24/2022 ? ? 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  ?Past Therapy: ?FOLFOX/Nivolumab/Herceptin -- s/p cycle #8 --started on 09/19/2020 -- d/c on 01/26/2021 ?  ?Current Therapy:        ?Enhertu -- IV q 3 weeks -- started 02/01/2021, s/p cycle 13 ?IV iron as indicated-last dose given on 10/05/2020 ?Xarelto 10 mg po q day -- start on 03/02/2021 and dose reduced to maintenance on 03/29/2021 - on hold due to GI bleed ?  ?Interim History:  Jimmy Blankenship is here today with his interpretor for follow-up and treatment. He is doing well but notes some abdominal bloating. He denies pain ?He denies fever, chills, n/v, cough, rash, dizziness, palpitations or changes in bladder habits.  ?He has had some mild constipation now with BM every other day. No gas.  ?Bowel sounds heard throughout on exam. No diarrhea.  ?He notes mild SOB and chest discomfort with walking fast. This resolves with taking a break to rest.  ?He has minimal swelling in his feet and ankles. He is wearing compression stockings which helps reduce fluid retention.  ?He has tingling in his fingertips and toes unchanged from baseline. ?No falls or syncope to report.  ?Appetite and hydration are good. Weight is stable at 147 lbs.  ? ?ECOG Performance Status: 1 - Symptomatic but completely ambulatory ? ?Medications:  ?Allergies as of 01/24/2022   ?No Known Allergies ?  ? ?  ?Medication List  ?  ? ?  ? Accurate as of Jan 24, 2022 11:30 AM. If you have any questions, ask your nurse or doctor.  ?  ?  ? ?  ? ?atorvastatin 40 MG tablet ?Commonly known as: LIPITOR ?Take 80 mg by mouth daily. ?  ?dexamethasone 4 MG tablet ?Commonly known as: DECADRON ?Take 2 tablets (8 mg total) by mouth daily. Start the day after chemotherapy for 2 days. ?   ?diphenoxylate-atropine 2.5-0.025 MG tablet ?Commonly known as: LOMOTIL ?TAKE ONE TABLET BY MOUTH FOUR TIMES A DAY AS NEEDED FOR DIARRHEA OR LOOSE STOOLS ?What changed:  ?how much to take ?how to take this ?when to take this ?reasons to take this ?additional instructions ?  ?feeding supplement Liqd ?Take 237 mLs by mouth 2 (two) times daily between meals. ?  ?gabapentin 300 MG capsule ?Commonly known as: NEURONTIN ?Take 1 capsule (300 mg total) by mouth 4 (four) times daily. ?What changed: when to take this ?  ?glipiZIDE 5 MG tablet ?Commonly known as: GLUCOTROL ?Take 5 mg by mouth 2 (two) times daily before a meal. ?  ?insulin glargine 100 unit/mL Sopn ?Commonly known as: LANTUS ?Inject 10 Units into the skin daily as needed (for blood sugar over 200). ?  ?lidocaine-prilocaine cream ?Commonly known as: EMLA ?Apply to affected area once ?What changed:  ?how much to take ?when to take this ?reasons to take this ?additional instructions ?  ?LORazepam 0.5 MG tablet ?Commonly known as: Ativan ?Take 1 tablet (0.5 mg total) by mouth every 6 (six) hours as needed (Nausea or vomiting). ?  ?megestrol 400 MG/10ML suspension ?Commonly known as: MEGACE ?Take 10 mLs (400 mg total) by mouth 2 (two) times daily. ?  ?metFORMIN 1000 MG tablet ?Commonly known as: GLUCOPHAGE ?Take 1,000 mg by mouth 2 (two) times daily with a meal. ?  ?metoprolol tartrate 50 MG tablet ?Commonly  known as: LOPRESSOR ?TAKE ONE TABLET BY MOUTH TWICE A DAY ?  ?ondansetron 8 MG tablet ?Commonly known as: Zofran ?Take 1 tablet (8 mg total) by mouth 2 (two) times daily as needed for refractory nausea / vomiting. Start on day 3 after chemo. ?  ?pantoprazole 40 MG tablet ?Commonly known as: PROTONIX ?Take 1 tablet (40 mg total) by mouth 2 (two) times daily. Follow up with gastroenterology for additional instructions ?  ?prochlorperazine 10 MG tablet ?Commonly known as: COMPAZINE ?Take 1 tablet (10 mg total) by mouth every 6 (six) hours as needed (Nausea or  vomiting). ?What changed: reasons to take this ?  ?sucralfate 1 g tablet ?Commonly known as: CARAFATE ?Take 1 tablet (1 g total) by mouth 4 (four) times daily -  with meals and at bedtime for 14 days. ?  ?tamsulosin 0.4 MG Caps capsule ?Commonly known as: FLOMAX ?Take 0.4 mg by mouth at bedtime. ?  ? ?  ? ? ?Allergies: No Known Allergies ? ?Past Medical History, Surgical history, Social history, and Family History were reviewed and updated. ? ?Review of Systems: ?All other 10 point review of systems is negative.  ? ?Physical Exam: ? vitals were not taken for this visit.  ? ?Wt Readings from Last 3 Encounters:  ?01/03/22 140 lb 6.4 oz (63.7 kg)  ?01/03/22 140 lb 0.4 oz (63.5 kg)  ?12/28/21 141 lb (64 kg)  ? ? ?Ocular: Sclerae unicteric, pupils equal, round and reactive to light ?Ear-nose-throat: Oropharynx clear, dentition fair ?Lymphatic: No cervical or supraclavicular adenopathy ?Lungs no rales or rhonchi, good excursion bilaterally ?Heart regular rate and rhythm, no murmur appreciated ?Abd soft, nontender, positive bowel sounds ?MSK no focal spinal tenderness, no joint edema ?Neuro: non-focal, well-oriented, appropriate affect ?Breasts: Deferred  ? ?Lab Results  ?Component Value Date  ? WBC 5.1 01/03/2022  ? HGB 11.0 (L) 01/03/2022  ? HCT 32.2 (L) 01/03/2022  ? MCV 97.3 01/03/2022  ? PLT 162 01/03/2022  ? ?Lab Results  ?Component Value Date  ? FERRITIN 2,245 (H) 01/03/2022  ? IRON 96 01/03/2022  ? TIBC 279 01/03/2022  ? UIBC 183 01/03/2022  ? IRONPCTSAT 35 01/03/2022  ? ?Lab Results  ?Component Value Date  ? RETICCTPCT 3.0 06/28/2021  ? RBC 3.31 (L) 01/03/2022  ? ?No results found for: KPAFRELGTCHN, LAMBDASER, KAPLAMBRATIO ?No results found for: IGGSERUM, IGA, IGMSERUM ?No results found for: TOTALPROTELP, ALBUMINELP, A1GS, A2GS, BETS, BETA2SER, GAMS, MSPIKE, SPEI ?  Chemistry   ?   ?Component Value Date/Time  ? NA 136 01/03/2022 0800  ? NA 140 06/10/2020 0935  ? K 3.7 01/03/2022 0800  ? CL 106 01/03/2022 0800  ?  CO2 21 (L) 01/03/2022 0800  ? BUN 9 01/03/2022 0800  ? BUN 17 06/10/2020 0935  ? CREATININE 1.01 01/03/2022 0800  ?    ?Component Value Date/Time  ? CALCIUM 8.8 (L) 01/03/2022 0800  ? ALKPHOS 173 (H) 01/03/2022 0800  ? AST 29 01/03/2022 0800  ? ALT 28 01/03/2022 0800  ? BILITOT 1.1 01/03/2022 0800  ?  ? ? ? ?Impression and Plan: Mr. Quillin is a very pleasant 71 yo [name] gentleman with metastatic adenocarcinoma of the stomach. He completed 7 cycles of chemotherapy along with immunotherapy and targeted therapy. He then began to have some disease progression and was having a hard time tolerating treatment.  ?He is doing well on Enhurtu. We will proceed with treatment today as planned.  ?Iron studies are pending. We will replace if needed.  ?Xarelto remains  on hold due to GI blood loss and anemia.  ?Lactulose prescription sent to [facility].  ?Follow-up in 3 weeks.  ? ?Lottie Dawson, NP ?5/17/202311:30 AM ? ?

## 2022-01-25 LAB — IRON AND IRON BINDING CAPACITY (CC-WL,HP ONLY)
Iron: 69 ug/dL (ref 45–182)
Saturation Ratios: 25 % (ref 17.9–39.5)
TIBC: 276 ug/dL (ref 250–450)
UIBC: 207 ug/dL (ref 117–376)

## 2022-01-26 ENCOUNTER — Telehealth: Payer: Self-pay | Admitting: *Deleted

## 2022-01-26 NOTE — Telephone Encounter (Signed)
Per 01/24/22 los - called and gave upcoming appointments - confirmed

## 2022-01-29 ENCOUNTER — Encounter: Payer: Self-pay | Admitting: Hematology & Oncology

## 2022-02-06 ENCOUNTER — Encounter: Payer: Self-pay | Admitting: Hematology & Oncology

## 2022-02-06 ENCOUNTER — Other Ambulatory Visit: Payer: Self-pay | Admitting: *Deleted

## 2022-02-06 DIAGNOSIS — K922 Gastrointestinal hemorrhage, unspecified: Secondary | ICD-10-CM

## 2022-02-06 DIAGNOSIS — D649 Anemia, unspecified: Secondary | ICD-10-CM

## 2022-02-06 DIAGNOSIS — E876 Hypokalemia: Secondary | ICD-10-CM

## 2022-02-07 ENCOUNTER — Encounter: Payer: Self-pay | Admitting: Hematology & Oncology

## 2022-02-07 ENCOUNTER — Inpatient Hospital Stay: Payer: Medicare Other

## 2022-02-07 ENCOUNTER — Inpatient Hospital Stay (HOSPITAL_BASED_OUTPATIENT_CLINIC_OR_DEPARTMENT_OTHER): Payer: Medicare Other | Admitting: Hematology & Oncology

## 2022-02-07 DIAGNOSIS — D649 Anemia, unspecified: Secondary | ICD-10-CM

## 2022-02-07 DIAGNOSIS — C799 Secondary malignant neoplasm of unspecified site: Secondary | ICD-10-CM | POA: Diagnosis not present

## 2022-02-07 DIAGNOSIS — E876 Hypokalemia: Secondary | ICD-10-CM

## 2022-02-07 DIAGNOSIS — K922 Gastrointestinal hemorrhage, unspecified: Secondary | ICD-10-CM

## 2022-02-07 DIAGNOSIS — C165 Malignant neoplasm of lesser curvature of stomach, unspecified: Secondary | ICD-10-CM

## 2022-02-07 DIAGNOSIS — C169 Malignant neoplasm of stomach, unspecified: Secondary | ICD-10-CM

## 2022-02-07 LAB — CMP (CANCER CENTER ONLY)
ALT: 36 U/L (ref 0–44)
AST: 37 U/L (ref 15–41)
Albumin: 3.3 g/dL — ABNORMAL LOW (ref 3.5–5.0)
Alkaline Phosphatase: 225 U/L — ABNORMAL HIGH (ref 38–126)
Anion gap: 11 (ref 5–15)
BUN: 13 mg/dL (ref 8–23)
CO2: 20 mmol/L — ABNORMAL LOW (ref 22–32)
Calcium: 8.6 mg/dL — ABNORMAL LOW (ref 8.9–10.3)
Chloride: 110 mmol/L (ref 98–111)
Creatinine: 0.96 mg/dL (ref 0.61–1.24)
GFR, Estimated: >60 mL/min (ref >60)
Glucose, Bld: 182 mg/dL — ABNORMAL HIGH (ref 70–99)
Potassium: 3.1 mmol/L — ABNORMAL LOW (ref 3.5–5.1)
Sodium: 141 mmol/L (ref 135–145)
Total Bilirubin: 1.5 mg/dL — ABNORMAL HIGH (ref 0.3–1.2)
Total Protein: 5.4 g/dL — ABNORMAL LOW (ref 6.5–8.1)

## 2022-02-07 LAB — CBC WITH DIFFERENTIAL (CANCER CENTER ONLY)
Abs Immature Granulocytes: 0 10*3/uL (ref 0.00–0.07)
Band Neutrophils: 0 %
Basophils Absolute: 0.1 10*3/uL (ref 0.0–0.1)
Basophils Relative: 2 %
Blasts: 0 %
Eosinophils Absolute: 0.1 10*3/uL (ref 0.0–0.5)
Eosinophils Relative: 4 %
HCT: 25.6 % — ABNORMAL LOW (ref 39.0–52.0)
Hemoglobin: 8.7 g/dL — ABNORMAL LOW (ref 13.0–17.0)
Lymphocytes Relative: 18 %
Lymphs Abs: 0.5 10*3/uL — ABNORMAL LOW (ref 0.7–4.0)
MCH: 34.4 pg — ABNORMAL HIGH (ref 26.0–34.0)
MCHC: 34 g/dL (ref 30.0–36.0)
MCV: 101.2 fL — ABNORMAL HIGH (ref 80.0–100.0)
Metamyelocytes Relative: 0 %
Monocytes Absolute: 0.3 10*3/uL (ref 0.1–1.0)
Monocytes Relative: 12 %
Myelocytes: 0 %
Neutro Abs: 1.9 10*3/uL (ref 1.7–7.7)
Neutrophils Relative %: 64 %
Other: 0 %
Platelet Count: 127 10*3/uL — ABNORMAL LOW (ref 150–400)
Promyelocytes Relative: 0 %
RBC: 2.53 MIL/uL — ABNORMAL LOW (ref 4.22–5.81)
RDW: 20.5 % — ABNORMAL HIGH (ref 11.5–15.5)
WBC Count: 2.9 10*3/uL — ABNORMAL LOW (ref 4.0–10.5)
nRBC: 0 /100 WBC
nRBC: 0.7 % — ABNORMAL HIGH (ref 0.0–0.2)

## 2022-02-07 LAB — SAMPLE TO BLOOD BANK

## 2022-02-07 LAB — PREPARE RBC (CROSSMATCH)

## 2022-02-07 NOTE — Progress Notes (Signed)
Hematology and Oncology Follow Up Visit  Jimmy Blankenship [id number] [birth year] 71 y.o. 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  Unfortunately, I think he is bleeding again.  He feels tired.  He has some abdominal fullness.  He does not have a lot of energy.  He is not eating all that much.  We did go ahead and do a rectal exam on him.  His stool was dark.  There is some blood streaks.  He did have an external hemorrhoid.  I do not see any bleeding from this.  His stool was grossly heme positive.  He was upper endoscope back in April.  At that point, there did not appear to be any obvious malignancy.  He is going need to have another upper endoscopy.  I think if he is bleeding and there is cancer with the upper endoscopy, we probably will have to consider him for radiation therapy.  He has had no cough.  He is off anticoagulation.  There is been no fever.  Overall, I would say his performance status is probably ECOG 2.    Medications:  Allergies as of 02/07/2022   No Known Allergies      Medication List        Accurate as of Feb 07, 2022 12:33 PM. If you have any questions, ask your nurse or doctor.          atorvastatin 40 MG tablet Commonly known as: LIPITOR Take 80 mg by mouth daily.   dexamethasone 4 MG tablet Commonly known as: DECADRON Take 2 tablets (8 mg total) by mouth daily. Start the day after  chemotherapy for 2 days.   diphenhydrAMINE 25 MG tablet Commonly known as: BENADRYL Take 25 mg by mouth at bedtime as needed.   diphenoxylate-atropine 2.5-0.025 MG tablet Commonly known as: LOMOTIL TAKE ONE TABLET BY MOUTH FOUR TIMES A DAY AS NEEDED FOR DIARRHEA OR LOOSE STOOLS What changed:  how much to take how to take this when to take this reasons to take this additional instructions   feeding supplement Liqd Take 237 mLs by mouth 2 (two) times daily between meals.   gabapentin 300 MG capsule Commonly known as: NEURONTIN Take 1 capsule (300 mg total) by mouth 4 (four) times daily. What changed: when to take this   glipiZIDE 5 MG tablet Commonly known as: GLUCOTROL Take 5 mg by mouth 2 (two) times daily before a meal.   insulin glargine 100 unit/mL Sopn Commonly known as: LANTUS Inject 10 Units into the skin daily as needed (for blood sugar over 200).   lactulose 10 GM/15ML solution Commonly known as: CHRONULAC Take 15 mLs (10 g total) by mouth 2 (two) times daily as needed for mild constipation.   lidocaine-prilocaine cream Commonly known as: EMLA Apply to affected area once What changed:  how much to take when to take this reasons to take this additional  instructions   LORazepam 0.5 MG tablet Commonly known as: Ativan Take 1 tablet (0.5 mg total) by mouth every 6 (six) hours as needed (Nausea or vomiting).   megestrol 400 MG/10ML suspension Commonly known as: MEGACE Take 10 mLs (400 mg total) by mouth 2 (two) times daily.   melatonin 5 MG Tabs Take 5 mg by mouth at bedtime as needed.   metFORMIN 1000 MG tablet Commonly known as: GLUCOPHAGE Take 1,000 mg by mouth 2 (two) times daily with a meal.   metoprolol tartrate 50 MG tablet Commonly known as: LOPRESSOR TAKE ONE TABLET BY MOUTH TWICE A DAY   ondansetron 8 MG tablet Commonly known as: Zofran Take 1 tablet (8 mg total) by mouth 2 (two) times daily as needed for refractory nausea / vomiting.  Start on day 3 after chemo.   pantoprazole 40 MG tablet Commonly known as: PROTONIX Take 1 tablet (40 mg total) by mouth 2 (two) times daily. Follow up with gastroenterology for additional instructions   prochlorperazine 10 MG tablet Commonly known as: COMPAZINE Take 1 tablet (10 mg total) by mouth every 6 (six) hours as needed (Nausea or vomiting). What changed: reasons to take this   sucralfate 1 g tablet Commonly known as: CARAFATE Take 1 tablet (1 g total) by mouth 4 (four) times daily -  with meals and at bedtime for 14 days.   tamsulosin 0.4 MG Caps capsule Commonly known as: FLOMAX Take 0.4 mg by mouth at bedtime.        Allergies: No Known Allergies  Past Medical History, Surgical history, Social history, and Family History were reviewed and updated.  Review of Systems: Review of Systems  Constitutional:  Positive for malaise/fatigue and weight loss.  HENT: Negative.    Eyes: Negative.   Respiratory:  Positive for shortness of breath.   Cardiovascular: Negative.   Gastrointestinal:  Positive for abdominal pain, heartburn and melena.  Genitourinary: Negative.   Musculoskeletal: Negative.   Skin: Negative.   Neurological: Negative.   Endo/Heme/Allergies: Negative.   Psychiatric/Behavioral: Negative.      Physical Exam:  vitals were not taken for this visit.   Wt Readings from Last 3 Encounters:  01/24/22 147 lb (66.7 kg)  01/03/22 140 lb 6.4 oz (63.7 kg)  01/03/22 140 lb 0.4 oz (63.5 kg)    Ocular: Sclerae unicteric, pupils equal, round and reactive to light Ear-nose-throat: Oropharynx clear, dentition fair Lymphatic: No cervical or supraclavicular adenopathy Lungs no rales or rhonchi, good excursion bilaterally Heart regular rate and rhythm, no murmur appreciated Abd soft, nontender, positive bowel sounds MSK no focal spinal tenderness, no joint edema Neuro: non-focal, well-oriented, appropriate affect Breasts: Deferred   Lab Results  Component  Value Date   WBC 2.9 (L) 02/07/2022   HGB 8.7 (L) 02/07/2022   HCT 25.6 (L) 02/07/2022   MCV 101.2 (H) 02/07/2022   PLT 127 (L) 02/07/2022   Lab Results  Component Value Date   FERRITIN 1,707 (H) 01/24/2022   IRON 69 01/24/2022   TIBC 276 01/24/2022   UIBC 207 01/24/2022   IRONPCTSAT 25 01/24/2022   Lab Results  Component Value Date   RETICCTPCT 3.0 06/28/2021   RBC 2.53 (L) 02/07/2022   No results found for: KPAFRELGTCHN, LAMBDASER, KAPLAMBRATIO No results found for: IGGSERUM, IGA, IGMSERUM No results found for: Kathrynn Ducking, MSPIKE, SPEI   Chemistry      Component Value Date/Time   NA 141 02/07/2022 1012   NA 140 06/10/2020  0935   K 3.1 (L) 02/07/2022 1012   CL 110 02/07/2022 1012   CO2 20 (L) 02/07/2022 1012   BUN 13 02/07/2022 1012   BUN 17 06/10/2020 0935   CREATININE 0.96 02/07/2022 1012      Component Value Date/Time   CALCIUM 8.6 (L) 02/07/2022 1012   ALKPHOS 225 (H) 02/07/2022 1012   AST 37 02/07/2022 1012   ALT 36 02/07/2022 1012   BILITOT 1.5 (H) 02/07/2022 1012       Impression and Plan: Jimmy Blankenship is a very pleasant 71 yo [name] gentleman with metastatic adenocarcinoma of the stomach.   Again, he is bleeding.  I know he has had this problem in the past.  We will have to get him to Gastroenterology.  I will have to call Dr. Lyndel Safe to see if we cannot do a upper endoscopy on him quickly.  Again, if we find that there is malignancy in the stomach, he will need to undergo radiation therapy.  We probably will use Xeloda with the radiation therapy to try to help with eliciting a good response.  He will come in on Thursday to be transfused.  He will get 2 units of blood.  Otherwise, he will keep his appointments as scheduled.   Volanda Napoleon, MD 5/31/202312:33 PM

## 2022-02-07 NOTE — Patient Instructions (Signed)

## 2022-02-08 ENCOUNTER — Other Ambulatory Visit: Payer: Self-pay

## 2022-02-08 ENCOUNTER — Telehealth: Payer: Self-pay

## 2022-02-08 ENCOUNTER — Inpatient Hospital Stay: Payer: Medicare Other | Attending: Hematology & Oncology

## 2022-02-08 ENCOUNTER — Inpatient Hospital Stay (HOSPITAL_COMMUNITY)
Admission: EM | Admit: 2022-02-08 | Discharge: 2022-02-14 | DRG: 375 | Disposition: A | Discharge status: Home / Self Care | Payer: Medicare Other | Source: Ambulatory Visit | Attending: Internal Medicine | Admitting: Internal Medicine

## 2022-02-08 ENCOUNTER — Encounter (HOSPITAL_COMMUNITY): Payer: Self-pay | Admitting: Emergency Medicine

## 2022-02-08 DIAGNOSIS — C165 Malignant neoplasm of lesser curvature of stomach, unspecified: Secondary | ICD-10-CM | POA: Diagnosis not present

## 2022-02-08 DIAGNOSIS — E782 Mixed hyperlipidemia: Secondary | ICD-10-CM | POA: Diagnosis present

## 2022-02-08 DIAGNOSIS — Z79899 Other long term (current) drug therapy: Secondary | ICD-10-CM

## 2022-02-08 DIAGNOSIS — C169 Malignant neoplasm of stomach, unspecified: Secondary | ICD-10-CM

## 2022-02-08 DIAGNOSIS — E1169 Type 2 diabetes mellitus with other specified complication: Secondary | ICD-10-CM | POA: Diagnosis present

## 2022-02-08 DIAGNOSIS — R5383 Other fatigue: Secondary | ICD-10-CM

## 2022-02-08 DIAGNOSIS — R188 Other ascites: Secondary | ICD-10-CM | POA: Diagnosis present

## 2022-02-08 DIAGNOSIS — Z8249 Family history of ischemic heart disease and other diseases of the circulatory system: Secondary | ICD-10-CM

## 2022-02-08 DIAGNOSIS — D61818 Other pancytopenia: Secondary | ICD-10-CM | POA: Diagnosis present

## 2022-02-08 DIAGNOSIS — E7800 Pure hypercholesterolemia, unspecified: Secondary | ICD-10-CM | POA: Diagnosis present

## 2022-02-08 DIAGNOSIS — Z9221 Personal history of antineoplastic chemotherapy: Secondary | ICD-10-CM

## 2022-02-08 DIAGNOSIS — Z794 Long term (current) use of insulin: Secondary | ICD-10-CM

## 2022-02-08 DIAGNOSIS — I5042 Chronic combined systolic (congestive) and diastolic (congestive) heart failure: Secondary | ICD-10-CM | POA: Diagnosis present

## 2022-02-08 DIAGNOSIS — R6881 Early satiety: Secondary | ICD-10-CM | POA: Diagnosis present

## 2022-02-08 DIAGNOSIS — C799 Secondary malignant neoplasm of unspecified site: Secondary | ICD-10-CM | POA: Diagnosis present

## 2022-02-08 DIAGNOSIS — K2990 Gastroduodenitis, unspecified, without bleeding: Secondary | ICD-10-CM | POA: Diagnosis present

## 2022-02-08 DIAGNOSIS — I2510 Atherosclerotic heart disease of native coronary artery without angina pectoris: Secondary | ICD-10-CM | POA: Diagnosis present

## 2022-02-08 DIAGNOSIS — D62 Acute posthemorrhagic anemia: Secondary | ICD-10-CM | POA: Diagnosis present

## 2022-02-08 DIAGNOSIS — Z955 Presence of coronary angioplasty implant and graft: Secondary | ICD-10-CM

## 2022-02-08 DIAGNOSIS — Z79818 Long term (current) use of other agents affecting estrogen receptors and estrogen levels: Secondary | ICD-10-CM

## 2022-02-08 DIAGNOSIS — Z7984 Long term (current) use of oral hypoglycemic drugs: Secondary | ICD-10-CM

## 2022-02-08 DIAGNOSIS — Z951 Presence of aortocoronary bypass graft: Secondary | ICD-10-CM

## 2022-02-08 DIAGNOSIS — D500 Iron deficiency anemia secondary to blood loss (chronic): Secondary | ICD-10-CM | POA: Insufficient documentation

## 2022-02-08 DIAGNOSIS — E119 Type 2 diabetes mellitus without complications: Secondary | ICD-10-CM

## 2022-02-08 DIAGNOSIS — I513 Intracardiac thrombosis, not elsewhere classified: Secondary | ICD-10-CM | POA: Diagnosis present

## 2022-02-08 DIAGNOSIS — I10 Essential (primary) hypertension: Secondary | ICD-10-CM | POA: Diagnosis present

## 2022-02-08 DIAGNOSIS — I252 Old myocardial infarction: Secondary | ICD-10-CM

## 2022-02-08 DIAGNOSIS — R140 Abdominal distension (gaseous): Secondary | ICD-10-CM

## 2022-02-08 DIAGNOSIS — Z87891 Personal history of nicotine dependence: Secondary | ICD-10-CM

## 2022-02-08 DIAGNOSIS — D649 Anemia, unspecified: Secondary | ICD-10-CM

## 2022-02-08 DIAGNOSIS — N400 Enlarged prostate without lower urinary tract symptoms: Secondary | ICD-10-CM | POA: Diagnosis not present

## 2022-02-08 DIAGNOSIS — K5900 Constipation, unspecified: Secondary | ICD-10-CM

## 2022-02-08 DIAGNOSIS — I110 Hypertensive heart disease with heart failure: Secondary | ICD-10-CM | POA: Diagnosis present

## 2022-02-08 DIAGNOSIS — Z66 Do not resuscitate: Secondary | ICD-10-CM | POA: Diagnosis present

## 2022-02-08 DIAGNOSIS — K922 Gastrointestinal hemorrhage, unspecified: Principal | ICD-10-CM | POA: Diagnosis present

## 2022-02-08 DIAGNOSIS — K2970 Gastritis, unspecified, without bleeding: Secondary | ICD-10-CM

## 2022-02-08 DIAGNOSIS — I255 Ischemic cardiomyopathy: Secondary | ICD-10-CM | POA: Diagnosis present

## 2022-02-08 DIAGNOSIS — E1142 Type 2 diabetes mellitus with diabetic polyneuropathy: Secondary | ICD-10-CM | POA: Diagnosis present

## 2022-02-08 DIAGNOSIS — K921 Melena: Secondary | ICD-10-CM | POA: Diagnosis present

## 2022-02-08 DIAGNOSIS — Z5112 Encounter for antineoplastic immunotherapy: Secondary | ICD-10-CM | POA: Insufficient documentation

## 2022-02-08 DIAGNOSIS — C787 Secondary malignant neoplasm of liver and intrahepatic bile duct: Secondary | ICD-10-CM | POA: Diagnosis present

## 2022-02-08 LAB — CBC
HCT: 33 % — ABNORMAL LOW (ref 39.0–52.0)
Hemoglobin: 11.1 g/dL — ABNORMAL LOW (ref 13.0–17.0)
MCH: 33 pg (ref 26.0–34.0)
MCHC: 33.6 g/dL (ref 30.0–36.0)
MCV: 98.2 fL (ref 80.0–100.0)
Platelets: 112 10*3/uL — ABNORMAL LOW (ref 150–400)
RBC: 3.36 MIL/uL — ABNORMAL LOW (ref 4.22–5.81)
RDW: 20.3 % — ABNORMAL HIGH (ref 11.5–15.5)
WBC: 2.4 10*3/uL — ABNORMAL LOW (ref 4.0–10.5)
nRBC: 0.8 % — ABNORMAL HIGH (ref 0.0–0.2)

## 2022-02-08 LAB — TYPE AND SCREEN
ABO/RH(D): A POS
Antibody Screen: NEGATIVE

## 2022-02-08 LAB — COMPREHENSIVE METABOLIC PANEL
ALT: 41 U/L (ref 0–44)
AST: 43 U/L — ABNORMAL HIGH (ref 15–41)
Albumin: 2.9 g/dL — ABNORMAL LOW (ref 3.5–5.0)
Alkaline Phosphatase: 214 U/L — ABNORMAL HIGH (ref 38–126)
Anion gap: 11 (ref 5–15)
BUN: 14 mg/dL (ref 8–23)
CO2: 18 mmol/L — ABNORMAL LOW (ref 22–32)
Calcium: 8.6 mg/dL — ABNORMAL LOW (ref 8.9–10.3)
Chloride: 112 mmol/L — ABNORMAL HIGH (ref 98–111)
Creatinine, Ser: 1.21 mg/dL (ref 0.61–1.24)
GFR, Estimated: >60 mL/min (ref >60)
Glucose, Bld: 144 mg/dL — ABNORMAL HIGH (ref 70–99)
Potassium: 3.3 mmol/L — ABNORMAL LOW (ref 3.5–5.1)
Sodium: 141 mmol/L (ref 135–145)
Total Bilirubin: 2.9 mg/dL — ABNORMAL HIGH (ref 0.3–1.2)
Total Protein: 5.3 g/dL — ABNORMAL LOW (ref 6.5–8.1)

## 2022-02-08 LAB — GLUCOSE, CAPILLARY: Glucose-Capillary: 103 mg/dL — ABNORMAL HIGH (ref 70–99)

## 2022-02-08 LAB — MAGNESIUM: Magnesium: 1.4 mg/dL — ABNORMAL LOW (ref 1.7–2.4)

## 2022-02-08 MED ORDER — INSULIN ASPART 100 UNIT/ML IJ SOLN
0.0000 [IU] | Freq: Three times a day (TID) | INTRAMUSCULAR | Status: DC
  Administered 2022-02-09: 2 [IU] via SUBCUTANEOUS
  Administered 2022-02-10: 3 [IU] via SUBCUTANEOUS
  Administered 2022-02-10: 2 [IU] via SUBCUTANEOUS
  Administered 2022-02-11 (×2): 3 [IU] via SUBCUTANEOUS
  Administered 2022-02-11 – 2022-02-12 (×2): 2 [IU] via SUBCUTANEOUS
  Administered 2022-02-12: 5 [IU] via SUBCUTANEOUS
  Administered 2022-02-12: 1 [IU] via SUBCUTANEOUS
  Administered 2022-02-13: 3 [IU] via SUBCUTANEOUS
  Administered 2022-02-13: 1 [IU] via SUBCUTANEOUS
  Administered 2022-02-13: 5 [IU] via SUBCUTANEOUS
  Administered 2022-02-14: 2 [IU] via SUBCUTANEOUS
  Administered 2022-02-14: 5 [IU] via SUBCUTANEOUS

## 2022-02-08 MED ORDER — MEGESTROL ACETATE 400 MG/10ML PO SUSP
400.0000 mg | Freq: Two times a day (BID) | ORAL | Status: DC
  Administered 2022-02-09 – 2022-02-14 (×10): 400 mg via ORAL
  Filled 2022-02-08 (×10): qty 10

## 2022-02-08 MED ORDER — ACETAMINOPHEN 650 MG RE SUPP: 650.0000 mg | Freq: Four times a day (QID) | RECTAL | Status: DC | PRN

## 2022-02-08 MED ORDER — SODIUM CHLORIDE 0.9% FLUSH
10.0000 mL | INTRAVENOUS | Status: AC | PRN
  Administered 2022-02-08: 10 mL

## 2022-02-08 MED ORDER — MELATONIN 5 MG PO TABS
5.0000 mg | ORAL_TABLET | Freq: Every evening | ORAL | Status: DC | PRN
Start: 2022-02-08 — End: 2022-02-14
  Administered 2022-02-08 – 2022-02-09 (×2): 5 mg via ORAL
  Filled 2022-02-08 (×2): qty 1

## 2022-02-08 MED ORDER — PANTOPRAZOLE SODIUM 40 MG PO TBEC
40.0000 mg | DELAYED_RELEASE_TABLET | Freq: Two times a day (BID) | ORAL | Status: DC
Start: 2022-02-08 — End: 2022-02-08
  Filled 2022-02-08: qty 1

## 2022-02-08 MED ORDER — DIPHENHYDRAMINE HCL 25 MG PO CAPS
25.0000 mg | ORAL_CAPSULE | Freq: Once | ORAL | Status: AC
  Administered 2022-02-08: 25 mg via ORAL
  Filled 2022-02-08: qty 1

## 2022-02-08 MED ORDER — SODIUM CHLORIDE 0.9% FLUSH
3.0000 mL | Freq: Two times a day (BID) | INTRAVENOUS | Status: DC
  Administered 2022-02-08 – 2022-02-11 (×6): 3 mL via INTRAVENOUS

## 2022-02-08 MED ORDER — ACETAMINOPHEN 325 MG PO TABS
650.0000 mg | ORAL_TABLET | Freq: Four times a day (QID) | ORAL | Status: DC | PRN
  Administered 2022-02-13: 650 mg via ORAL
  Filled 2022-02-08: qty 2

## 2022-02-08 MED ORDER — ENSURE ENLIVE PO LIQD
237.0000 mL | Freq: Two times a day (BID) | ORAL | Status: DC
  Administered 2022-02-09 – 2022-02-14 (×7): 237 mL via ORAL

## 2022-02-08 MED ORDER — SODIUM CHLORIDE 0.9% IV SOLUTION
250.0000 mL | Freq: Once | INTRAVENOUS | Status: AC
  Administered 2022-02-08: 250 mL via INTRAVENOUS

## 2022-02-08 MED ORDER — ATORVASTATIN CALCIUM 40 MG PO TABS
80.0000 mg | ORAL_TABLET | Freq: Every day | ORAL | Status: DC
  Administered 2022-02-09 – 2022-02-14 (×6): 80 mg via ORAL
  Filled 2022-02-08 (×6): qty 2

## 2022-02-08 MED ORDER — METOPROLOL TARTRATE 50 MG PO TABS
50.0000 mg | ORAL_TABLET | Freq: Two times a day (BID) | ORAL | Status: DC
  Administered 2022-02-09: 50 mg via ORAL
  Filled 2022-02-08: qty 1

## 2022-02-08 MED ORDER — PANTOPRAZOLE SODIUM 40 MG PO TBEC
40.0000 mg | DELAYED_RELEASE_TABLET | Freq: Two times a day (BID) | ORAL | Status: DC
  Administered 2022-02-09 – 2022-02-14 (×11): 40 mg via ORAL
  Filled 2022-02-08 (×11): qty 1

## 2022-02-08 MED ORDER — HEPARIN SOD (PORK) LOCK FLUSH 100 UNIT/ML IV SOLN
500.0000 [IU] | Freq: Every day | INTRAVENOUS | Status: AC | PRN
  Administered 2022-02-08: 500 [IU]

## 2022-02-08 MED ORDER — TAMSULOSIN HCL 0.4 MG PO CAPS
0.4000 mg | ORAL_CAPSULE | Freq: Every day | ORAL | Status: DC
  Administered 2022-02-08 – 2022-02-13 (×6): 0.4 mg via ORAL
  Filled 2022-02-08 (×6): qty 1

## 2022-02-08 MED ORDER — METOPROLOL TARTRATE 25 MG PO TABS
50.0000 mg | ORAL_TABLET | Freq: Two times a day (BID) | ORAL | Status: DC
  Filled 2022-02-08: qty 2

## 2022-02-08 MED ORDER — ACETAMINOPHEN 325 MG PO TABS
650.0000 mg | ORAL_TABLET | Freq: Once | ORAL | Status: AC
  Administered 2022-02-08: 650 mg via ORAL
  Filled 2022-02-08: qty 2

## 2022-02-08 MED ORDER — DIPHENHYDRAMINE HCL 25 MG PO CAPS: 25.0000 mg | ORAL_CAPSULE | Freq: Every evening | ORAL | Status: DC | PRN

## 2022-02-08 MED ORDER — POTASSIUM CHLORIDE 20 MEQ PO PACK
40.0000 meq | PACK | Freq: Once | ORAL | Status: AC
  Administered 2022-02-08: 40 meq via ORAL
  Filled 2022-02-08: qty 2

## 2022-02-08 MED ORDER — GABAPENTIN 300 MG PO CAPS
300.0000 mg | ORAL_CAPSULE | Freq: Three times a day (TID) | ORAL | Status: DC
  Administered 2022-02-08 – 2022-02-14 (×17): 300 mg via ORAL
  Filled 2022-02-08 (×17): qty 1

## 2022-02-08 NOTE — Telephone Encounter (Signed)
-----   Message from Jackquline Denmark, MD sent at 02/07/2022  7:42 PM EDT ----- Regarding: semi-urgent [name], Pl tell this pt to come over to [facility] ED for adm 6/1 and let me know RG  ----- Message ----- From: Volanda Napoleon, MD Sent: 02/07/2022  12:39 PM EDT To: Jackquline Denmark, MD  Merrie Roof: I need your help quickly.  Jimmy Blankenship seems to be bleeding again.  He comes in with a hemoglobin of 8.7 today.  His stool is black and grossly heme positive.  Can you please do an upper endoscopy on him again?   I have to believe that he is bleeding from cancer.  We need to know this so we can change our protocol.  Please make sure you call his daughter to get this scheduled.  I really hate the fact that you all are not across the hall from [place] now.  I hope that you and your family had a good Memorial Day weekend.  Laurey Arrow

## 2022-02-08 NOTE — ED Triage Notes (Signed)
Pt reports Dr sending pt over for black tarry stools. Pt denies abd pain but states he feels bloated. Pt had endoscopy 3 weeks ago

## 2022-02-08 NOTE — ED Provider Notes (Signed)
[facility] DEPT Provider Note   CSN: [id number] Arrival date & time: 02/08/22  1508     History  Chief Complaint  Patient presents with   Blood In Jimmy Blankenship is a 71 y.o. male.  The history is provided by the patient, the spouse and medical records. The history is limited by a language barrier. A language interpreter was used.  Illness Location:  Fatigue and shortness of breath, rectal bleeding Severity:  Moderate Onset quality:  Gradual Duration:  3 days Timing:  Intermittent Progression:  Waxing and waning Chronicity:  Recurrent Associated symptoms: fatigue and shortness of breath   Associated symptoms: no abdominal pain, no chest pain, no congestion, no cough, no diarrhea, no fever, no headaches, no loss of consciousness, no nausea and no vomiting       Home Medications Prior to Admission medications   Medication Sig Start Date End Date Taking? Authorizing Provider  atorvastatin (LIPITOR) 40 MG tablet Take 80 mg by mouth daily.    [provider]  dexamethasone (DECADRON) 4 MG tablet Take 2 tablets (8 mg total) by mouth daily. Start the day after chemotherapy for 2 days. 02/01/21   Volanda Napoleon, MD  diphenhydrAMINE (BENADRYL) 25 MG tablet Take 25 mg by mouth at bedtime as needed.    [provider]  diphenoxylate-atropine (LOMOTIL) 2.5-0.025 MG tablet TAKE ONE TABLET BY MOUTH FOUR TIMES A DAY AS NEEDED FOR DIARRHEA OR LOOSE STOOLS Patient taking differently: Take 1 tablet by mouth 4 (four) times daily as needed for diarrhea or loose stools. 10/31/21   Volanda Napoleon, MD  feeding supplement (ENSURE ENLIVE / ENSURE PLUS) LIQD Take 237 mLs by mouth 2 (two) times daily between meals. 08/16/20   Regalado, Belkys A, MD  gabapentin (NEURONTIN) 300 MG capsule Take 1 capsule (300 mg total) by mouth 4 (four) times daily. Patient taking differently: Take 300 mg by mouth 3 (three) times daily. 10/31/21   Volanda Napoleon,  MD  glipiZIDE (GLUCOTROL) 5 MG tablet Take 5 mg by mouth 2 (two) times daily before a meal. 03/09/21   [provider]  insulin glargine (LANTUS) 100 unit/mL SOPN Inject 10 Units into the skin daily as needed (for blood sugar over 200).    [provider]  lactulose (CHRONULAC) 10 GM/15ML solution Take 15 mLs (10 g total) by mouth 2 (two) times daily as needed for mild constipation. 01/24/22   Celso Amy, NP  lidocaine-prilocaine (EMLA) cream Apply to affected area once Patient taking differently: 1 application. daily as needed (port access). 02/01/21   Volanda Napoleon, MD  LORazepam (ATIVAN) 0.5 MG tablet Take 1 tablet (0.5 mg total) by mouth every 6 (six) hours as needed (Nausea or vomiting). 02/01/21   Volanda Napoleon, MD  megestrol (MEGACE) 400 MG/10ML suspension Take 10 mLs (400 mg total) by mouth 2 (two) times daily. 02/21/21   Volanda Napoleon, MD  melatonin 5 MG TABS Take 5 mg by mouth at bedtime as needed.    [provider]  metFORMIN (GLUCOPHAGE) 1000 MG tablet Take 1,000 mg by mouth 2 (two) times daily with a meal. 05/18/21   [provider]  metoprolol tartrate (LOPRESSOR) 50 MG tablet TAKE ONE TABLET BY MOUTH TWICE A DAY Patient taking differently: Take 50 mg by mouth 2 (two) times daily. 11/16/20   Nahser, Wonda Cheng, MD  ondansetron (ZOFRAN) 8 MG tablet Take 1 tablet (8 mg total) by mouth  2 (two) times daily as needed for refractory nausea / vomiting. Start on day 3 after chemo. 02/01/21   Volanda Napoleon, MD  pantoprazole (PROTONIX) 40 MG tablet Take 1 tablet (40 mg total) by mouth 2 (two) times daily. Follow up with gastroenterology for additional instructions 12/30/21 02/28/22  Elodia Florence., MD  prochlorperazine (COMPAZINE) 10 MG tablet Take 1 tablet (10 mg total) by mouth every 6 (six) hours as needed (Nausea or vomiting). Patient taking differently: Take 10 mg by mouth every 6 (six) hours as needed for nausea or vomiting (Nausea or  vomiting). 02/01/21   Volanda Napoleon, MD  sucralfate (CARAFATE) 1 g tablet Take 1 tablet (1 g total) by mouth 4 (four) times daily -  with meals and at bedtime for 14 days. 12/30/21 01/13/22  Elodia Florence., MD  tamsulosin (FLOMAX) 0.4 MG CAPS capsule Take 0.4 mg by mouth at bedtime. 08/07/21   [provider]      Allergies    Patient has no known allergies.    Review of Systems   Review of Systems  Constitutional:  Positive for fatigue. Negative for chills and fever.  HENT:  Negative for congestion.   Eyes:  Negative for visual disturbance.  Respiratory:  Positive for shortness of breath. Negative for cough and chest tightness.   Cardiovascular:  Negative for chest pain.  Gastrointestinal:  Positive for abdominal distention and blood in stool. Negative for abdominal pain, constipation, diarrhea, nausea and vomiting.  Genitourinary:  Negative for dysuria and flank pain.  Musculoskeletal:  Negative for back pain, neck pain and neck stiffness.  Neurological:  Negative for dizziness, seizures, loss of consciousness, weakness, light-headedness, numbness and headaches.  Psychiatric/Behavioral:  Negative for agitation and confusion.   All other systems reviewed and are negative.  Physical Exam Updated Vital Signs BP (!) 95/55   Pulse 78   Temp 97.6 F (36.4 C) (Oral)   Resp 16   SpO2 100%  Physical Exam Vitals and nursing note reviewed.  Constitutional:      General: He is not in acute distress.    Appearance: He is well-developed. He is not ill-appearing, toxic-appearing or diaphoretic.  HENT:     Head: Normocephalic and atraumatic.     Nose: No congestion or rhinorrhea.     Mouth/Throat:     Mouth: Mucous membranes are dry.  Eyes:     Extraocular Movements: Extraocular movements intact.     Conjunctiva/sclera: Conjunctivae normal.     Pupils: Pupils are equal, round, and reactive to light.  Cardiovascular:     Rate and Rhythm: Normal rate and regular rhythm.      Heart sounds: No murmur heard. Pulmonary:     Effort: Pulmonary effort is normal. No respiratory distress.     Breath sounds: Normal breath sounds. No wheezing, rhonchi or rales.  Chest:     Chest wall: No tenderness.  Abdominal:     General: Abdomen is flat. There is distension.     Palpations: Abdomen is soft.     Tenderness: There is no abdominal tenderness. There is no right CVA tenderness, left CVA tenderness, guarding or rebound.  Genitourinary:    Comments: Already reportedly heme positive by oncologist Musculoskeletal:        General: No swelling or tenderness.     Cervical back: Neck supple. No tenderness.  Skin:    General: Skin is warm and dry.     Capillary Refill: Capillary refill takes less than  2 seconds.     Coloration: Skin is not pale.     Findings: No erythema or rash.  Neurological:     General: No focal deficit present.     Mental Status: He is alert.     Sensory: No sensory deficit.     Motor: No weakness.  Psychiatric:        Mood and Affect: Mood normal.    ED Results / Procedures / Treatments   Labs (all labs ordered are listed, but only abnormal results are displayed) Labs Reviewed  COMPREHENSIVE METABOLIC PANEL - Abnormal; Notable for the following components:      Result Value   Potassium 3.3 (*)    Chloride 112 (*)    CO2 18 (*)    Glucose, Bld 144 (*)    Calcium 8.6 (*)    Total Protein 5.3 (*)    Albumin 2.9 (*)    AST 43 (*)    Alkaline Phosphatase 214 (*)    Total Bilirubin 2.9 (*)    All other components within normal limits  CBC - Abnormal; Notable for the following components:   WBC 2.4 (*)    RBC 3.36 (*)    Hemoglobin 11.1 (*)    HCT 33.0 (*)    RDW 20.3 (*)    Platelets 112 (*)    nRBC 0.8 (*)    All other components within normal limits  COMPREHENSIVE METABOLIC PANEL  CBC  POC OCCULT BLOOD, ED  TYPE AND SCREEN    EKG None  Radiology No results found.  Procedures Procedures    Medications Ordered in  ED Medications  megestrol (MEGACE) 400 MG/10ML suspension 400 mg (has no administration in time range)  atorvastatin (LIPITOR) tablet 80 mg (has no administration in time range)  metoprolol tartrate (LOPRESSOR) tablet 50 mg (has no administration in time range)  pantoprazole (PROTONIX) EC tablet 40 mg (has no administration in time range)  tamsulosin (FLOMAX) capsule 0.4 mg (has no administration in time range)  melatonin tablet 5 mg (has no administration in time range)  gabapentin (NEURONTIN) capsule 300 mg (has no administration in time range)  feeding supplement (ENSURE ENLIVE / ENSURE PLUS) liquid 237 mL (has no administration in time range)  diphenhydrAMINE (BENADRYL) tablet 25 mg (has no administration in time range)  sodium chloride flush (NS) 0.9 % injection 3 mL (has no administration in time range)  acetaminophen (TYLENOL) tablet 650 mg (has no administration in time range)    Or  acetaminophen (TYLENOL) suppository 650 mg (has no administration in time range)    ED Course/ Medical Decision Making/ A&P                           Medical Decision Making Amount and/or Complexity of Data Reviewed Labs: ordered.  Risk Decision regarding hospitalization.    Jimmy Blankenship is a 71 y.o. 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  According to patient, he has had some abdominal bloating for the last few days and has also had dark tarry stools.  He is denying any nausea or vomiting.  Denies abdominal pain.  Denies any fevers, chills, cough, or other complaints.  Chart review shows that patient's treatment  plan would change if he is having bleeding from his cancer in the stomach versus not.  On exam, lungs  clear.  Chest is nontender.  Abdomen is slightly distended but is nontender.  I did appreciate bowel sounds.  Flanks and back nontender.  Patient moving all extremities.  Blood pressure was over 100 during my initial evaluation.  Chart review shows that hemoglobin was in the eights yesterday and he received 2 blood transfusions today.  Some blood work was initiated in triage and shows his hemoglobin has improved to 11.1.  Patient still has low platelets and low white count.  Metabolic panel was overall similar but has a slight bump in bilirubin.  Per oncology team's plan, will call for admission to continue his work-up.  He is otherwise in no distress and resting comfortably.  6:54 PM Spoke to admitting medicine team.  They requested we call [facility] to let them know patient is here for endoscopy during admission.  We will call them  Spoke to [facility] who agrees with plan and they will see during his admission for further management.         Final Clinical Impression(s) / ED Diagnoses Final diagnoses:  Gastrointestinal hemorrhage, unspecified gastrointestinal hemorrhage type  Fatigue, unspecified type    Clinical Impression: 1. Gastrointestinal hemorrhage, unspecified gastrointestinal hemorrhage type   2. Fatigue, unspecified type     Disposition: Admit  This note was prepared with assistance of Dragon voice recognition software. Occasional wrong-word or sound-a-like substitutions may have occurred due to the inherent limitations of voice recognition software.      Tomi Paddock, Gwenyth Allegra, MD 02/08/22 2139

## 2022-02-08 NOTE — H&P (Signed)
History and Physical   Jimmy Blankenship ZOX:[id number] DOB: [birth year] DOA: 02/08/2022  PCP: [name], MD   Patient coming from: Home/infusion clinic  Chief Complaint: GI bleed, anemia  HPI: Jimmy Blankenship is a 71 y.o. male with medical history significant of stomach cancer with mets and GI bleeding/anemia, CAD status post CABG, CHF, diabetes, hyperlipidemia, hypertension, BPH, CKD, LV thrombus presenting with GI bleeding and symptomatic anemia.  History obtained with assistance of family, translation, chart review.  Patient with known history of stomach cancer with liver mets and recurrent GI bleeding and subsequent anemia presenting with the same.  Having repeat episode of bleeding and was seen by oncology yesterday with hemoglobin noted to be 8.7 down from baseline of 10-11 and dark stool.  Has had 2 to 3 days of abdominal bloating and dark tarry stools.  Oncology had ordered 2 unit transfusion today at transfusion center with improvement of hemoglobin to 11.1 but also want patient to be admitted for endoscopic evaluation for possible progression of stomach cancer.  Patient denies fevers, chills, chest pain, shortness of breath, abdominal pain, constipation, diarrhea, nausea, vomiting, diarrhea, constipation.  ED Course: Vital signs in the ED significant for blood pressure in the 81X to 914N systolic.  Lab work-up included CMP with potassium 3.3, chloride 112, bicarb stable 18, creatinine near baseline at 1.2, calcium 8.6, protein 5.3, albumin 2.9, AST stable at 43, alk phos stable at 214, T. bili of 2.9.  CBC with hemoglobin improved to 11.1 from 8.7 yesterday and baseline of 10-11, stable leukopenia at 2.4 and stable thrombocytopenia at 112.  FOBT pending.  Patient typed and screened.  EDP will send a message to GI to notify them of patient's admission.  Review of Systems: As per HPI otherwise all other systems reviewed and are negative.  Past Medical History:  Diagnosis Date   Cancer  of lesser curvature of stomach ([place]) 08/22/2020   CKD (chronic kidney disease)    Coronary Artery Disease s/p CABG    hx of PCI in 2000s in [place] // s/p NSTEMI in 7/21 >> CABG   Diabetes mellitus 2    Goals of care, counseling/discussion 08/22/2020   Heart failure with reduced ejection fraction    Ischemic CM // Echo 7/21 - EF 35 // Intraop TEE 7/21: EF 40-45 // Echocardiogram 9/21: apical AK, EF 40-45, Gr 2 DD, trace AI, mild MR, mild LAE, normal RVSF, no pericardial effusion   High cholesterol    Hypertension    Iron deficiency anemia    Heme + stools (during admit for NSTEMI in 7/21)   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 Normal per patient. [place]   COLONOSCOPY N/A 08/15/2020   Procedure: COLONOSCOPY;  Surgeon: Ladene Artist, MD;  Location: [facility];  Service: Gastroenterology;  Laterality: N/A;   CORONARY ANGIOPLASTY WITH STENT PLACEMENT     CORONARY ARTERY BYPASS GRAFT N/A 03/24/2020   Procedure: CORONARY ARTERY BYPASS GRAFTING (CABG), ON PUMP,  TIMES THREE, USING LEFT INTERNAL MAMMARY ARTERY AND RIGHT ENDOSCOPICALLY HARVESTED GREATER SAPHENOUS VEIN;  Surgeon: Ivin Poot, MD;  Location: [facility];  Service: Open Heart Surgery;  Laterality: N/A;   ESOPHAGOGASTRODUODENOSCOPY N/A 08/15/2020   Procedure: ESOPHAGOGASTRODUODENOSCOPY (EGD);  Surgeon: Ladene Artist, MD;  Location: [facility];  Service: Gastroenterology;  Laterality: N/A;   ESOPHAGOGASTRODUODENOSCOPY (EGD) WITH PROPOFOL  N/A 12/29/2021   Procedure: ESOPHAGOGASTRODUODENOSCOPY (EGD) WITH PROPOFOL;  Surgeon: Jackquline Denmark, MD;  Location: [facility];  Service: Gastroenterology;  Laterality: N/A;   IR IMAGING GUIDED PORT INSERTION  09/16/2020   LEFT HEART CATH AND CORONARY ANGIOGRAPHY N/A 03/17/2020   Procedure: LEFT HEART CATH AND CORONARY ANGIOGRAPHY;  Surgeon: Belva Crome, MD;  Location: [facility];  Service: Cardiovascular;  Laterality: N/A;   POLYPECTOMY  08/15/2020   Procedure: POLYPECTOMY;  Surgeon: Ladene Artist, MD;  Location: [facility];  Service: Gastroenterology;;   TEE WITHOUT CARDIOVERSION N/A 03/24/2020   Procedure: TRANSESOPHAGEAL ECHOCARDIOGRAM (TEE);  Surgeon: [name], Collier Salina, MD;  Location: [facility];  Service: Open Heart Surgery;  Laterality: N/A;    Social History  reports that he quit smoking about 17 years ago. His smoking use included cigarettes. He has a 30.00 pack-year smoking history. He has never used smokeless tobacco. He reports that he does not drink alcohol and does not use drugs.  No Known Allergies  Family History  Problem Relation Age of Onset   Atrial fibrillation Mother    Congestive Heart Failure Mother    Cancer Neg Hx   Reviewed on admission  Prior to Admission medications   Medication Sig Start Date End Date Taking? Authorizing Provider  atorvastatin (LIPITOR) 40 MG tablet Take 80 mg by mouth daily.    [provider]  dexamethasone (DECADRON) 4 MG tablet Take 2 tablets (8 mg total) by mouth daily. Start the day after chemotherapy for 2 days. 02/01/21   Volanda Napoleon, MD  diphenhydrAMINE (BENADRYL) 25 MG tablet Take 25 mg by mouth at bedtime as needed.    [provider]  diphenoxylate-atropine (LOMOTIL) 2.5-0.025 MG tablet TAKE ONE TABLET BY MOUTH FOUR TIMES A DAY AS NEEDED FOR DIARRHEA OR LOOSE STOOLS Patient taking differently: Take 1 tablet by mouth 4 (four) times daily as needed for diarrhea or loose stools. 10/31/21   Volanda Napoleon,  MD  feeding supplement (ENSURE ENLIVE / ENSURE PLUS) LIQD Take 237 mLs by mouth 2 (two) times daily between meals. 08/16/20   Regalado, Belkys A, MD  gabapentin (NEURONTIN) 300 MG capsule Take 1 capsule (300 mg total) by mouth 4 (four) times daily. Patient taking differently: Take 300 mg by mouth 3 (three) times daily. 10/31/21   Volanda Napoleon, MD  glipiZIDE (GLUCOTROL) 5 MG tablet Take 5 mg by mouth 2 (two) times daily before a meal. 03/09/21   [provider]  insulin glargine (LANTUS) 100 unit/mL SOPN Inject 10 Units into the skin daily as needed (for blood sugar over 200).    [provider]  lactulose (CHRONULAC) 10 GM/15ML solution Take 15 mLs (10 g total) by mouth 2 (two) times daily as needed for mild constipation. 01/24/22   Celso Amy, NP  lidocaine-prilocaine (EMLA) cream Apply to affected area once Patient taking differently: 1 application. daily as needed (port access). 02/01/21   Volanda Napoleon, MD  LORazepam (ATIVAN) 0.5 MG tablet Take 1 tablet (0.5 mg total) by mouth every 6 (six) hours as needed (Nausea or vomiting). 02/01/21  Volanda Napoleon, MD  megestrol (MEGACE) 400 MG/10ML suspension Take 10 mLs (400 mg total) by mouth 2 (two) times daily. 02/21/21   Volanda Napoleon, MD  melatonin 5 MG TABS Take 5 mg by mouth at bedtime as needed.    [provider]  metFORMIN (GLUCOPHAGE) 1000 MG tablet Take 1,000 mg by mouth 2 (two) times daily with a meal. 05/18/21   [provider]  metoprolol tartrate (LOPRESSOR) 50 MG tablet TAKE ONE TABLET BY MOUTH TWICE A DAY Patient taking differently: Take 50 mg by mouth 2 (two) times daily. 11/16/20   Nahser, Wonda Cheng, MD  ondansetron (ZOFRAN) 8 MG tablet Take 1 tablet (8 mg total) by mouth 2 (two) times daily as needed for refractory nausea / vomiting. Start on day 3 after chemo. 02/01/21   Volanda Napoleon, MD  pantoprazole (PROTONIX) 40 MG tablet Take 1 tablet (40 mg total) by mouth 2 (two) times daily. Follow  up with gastroenterology for additional instructions 12/30/21 02/28/22  Elodia Florence., MD  prochlorperazine (COMPAZINE) 10 MG tablet Take 1 tablet (10 mg total) by mouth every 6 (six) hours as needed (Nausea or vomiting). Patient taking differently: Take 10 mg by mouth every 6 (six) hours as needed for nausea or vomiting (Nausea or vomiting). 02/01/21   Volanda Napoleon, MD  sucralfate (CARAFATE) 1 g tablet Take 1 tablet (1 g total) by mouth 4 (four) times daily -  with meals and at bedtime for 14 days. 12/30/21 01/13/22  Elodia Florence., MD  tamsulosin (FLOMAX) 0.4 MG CAPS capsule Take 0.4 mg by mouth at bedtime. 08/07/21   [provider]    Physical Exam: Vitals:   02/08/22 1715 02/08/22 1735 02/08/22 1800 02/08/22 1815  BP: 105/64 107/62 108/66 106/69  Pulse: 70 74 72 74  Resp:  18 (!) 22 (!) 25  Temp:      TempSrc:      SpO2: 100% 100% 99% 98%    Physical Exam Constitutional:      General: He is not in acute distress.    Appearance: Normal appearance.  HENT:     Head: Normocephalic and atraumatic.     Mouth/Throat:     Mouth: Mucous membranes are moist.     Pharynx: Oropharynx is clear.  Eyes:     Extraocular Movements: Extraocular movements intact.     Pupils: Pupils are equal, round, and reactive to light.  Cardiovascular:     Rate and Rhythm: Normal rate and regular rhythm.     Pulses: Normal pulses.     Heart sounds: Normal heart sounds.  Pulmonary:     Effort: Pulmonary effort is normal. No respiratory distress.     Breath sounds: Normal breath sounds.  Abdominal:     General: Bowel sounds are normal. There is no distension.     Palpations: Abdomen is soft.     Tenderness: There is abdominal tenderness (Mild).  Musculoskeletal:        General: No swelling or deformity.  Skin:    General: Skin is warm and dry.  Neurological:     General: No focal deficit present.     Mental Status: Mental status is at baseline.   Labs on Admission: I have  personally reviewed following labs and imaging studies  CBC: Recent Labs  Lab 02/07/22 1012 02/08/22 1553  WBC 2.9* 2.4*  NEUTROABS 1.9  --   HGB 8.7* 11.1*  HCT 25.6* 33.0*  MCV 101.2* 98.2  PLT 127* 112*    Basic Metabolic Panel: Recent Labs  Lab 02/07/22 1012 02/08/22 1553  NA 141 141  K 3.1* 3.3*  CL 110 112*  CO2 20* 18*  GLUCOSE 182* 144*  BUN 13 14  CREATININE 0.96 1.21  CALCIUM 8.6* 8.6*    GFR: CrCl cannot be calculated (Unknown ideal weight.).  Liver Function Tests: Recent Labs  Lab 02/07/22 1012 02/08/22 1553  AST 37 43*  ALT 36 41  ALKPHOS 225* 214*  BILITOT 1.5* 2.9*  PROT 5.4* 5.3*  ALBUMIN 3.3* 2.9*    Urine analysis:    Component Value Date/Time   COLORURINE YELLOW 02/20/2021 2200   APPEARANCEUR CLEAR 02/20/2021 2200   LABSPEC 1.033 (H) 02/20/2021 2200   PHURINE 5.0 02/20/2021 2200   GLUCOSEU 50 (A) 02/20/2021 2200   HGBUR NEGATIVE 02/20/2021 2200   BILIRUBINUR NEGATIVE 02/20/2021 2200   KETONESUR NEGATIVE 02/20/2021 2200   PROTEINUR NEGATIVE 02/20/2021 2200   NITRITE NEGATIVE 02/20/2021 2200   LEUKOCYTESUR NEGATIVE 02/20/2021 2200    Radiological Exams on Admission: No results found.  EKG: Not performed in the emergency department.  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)   Iron deficiency anemia due to chronic blood loss   Coronary artery disease involving native coronary artery of native heart without angina pectoris   Chronic combined systolic and diastolic congestive heart failure (HCC)   Controlled type 2 diabetes mellitus with diabetic polyneuropathy, with long-term current use of insulin (HCC)   Mixed diabetic hyperlipidemia associated with type 2 diabetes mellitus (HCC)   Essential hypertension   BPH (benign prostatic hyperplasia)   S/P CABG x 3   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 (HCC)   GI  bleed Symptomatic anemia > Patient presenting with GI bleeding and symptomatic anemia in the setting of gastric cancer as below. > Received 2 units outpatient ordered by oncology and is being admitted for endoscopic evaluation for possible progression of stomach cancer > Hemoglobin now 11.1 after transfusion up from 8.7 yesterday. > ED provider will send a message to GI to notify them of patient's admission and need for endoscopy - Monitor on telemetry - Continue to trend CBC - Appreciate GI recommendations - N.p.o. at midnight  Gastric cancer > Known history of gastric cancer with liver mets and recurrent GI bleeding and symptomatic anemia. > Currently on Enhertu and iron supplementation > Follows with Dr. Marin Olp who saw patient yesterday and his sent patient in for endoscopic evaluation consider recurrent GI bleeding and need to evaluate for disease burden - Oncology is aware of patient's admission, appreciate any recommendations - Appreciate GI recommendations - Supportive care - Continue home PPI  CAD > Status post CABG - Continue home atorvastatin, metoprolol  CHF > Last echo in 2020 with EF 40-45%, G1 DD, moderately reduced RV function > Not currently on any diuretics - Continue home metoprolol  Diabetes - SSI  Hyperlipidemia - Continue home atorvastatin  Hypertension > Low normal blood pressure in the ED - Continue home metoprolol  BPH - Continue home tamsulosin  CKD > Creatinine near baseline at 1.2 - Trend renal function electrolytes  DVT prophylaxis: SCDs Code Status:   DNR Family Communication:  Updated at bedside  Disposition Plan:   Patient is from:  Home  Anticipated DC to:  Home  Anticipated DC date:  1 to 3 days  Anticipated DC barriers: None  Consults called:  Message sent to GI by EDP. Admission status:  Observation, telemetry  Severity of Illness: The appropriate patient status for this patient is OBSERVATION. Observation status is judged  to be reasonable and necessary in order to provide the required intensity of service to ensure the patient's safety. The patient's presenting symptoms, physical exam findings, and initial radiographic and laboratory data in the context of their medical condition is felt to place them at decreased risk for further clinical deterioration. Furthermore, it is anticipated that the patient will be medically stable for discharge from the hospital within 2 midnights of admission.    Marcelyn Bruins MD [facility]  How to contact the [facility] Attending or Consulting provider [place] or covering provider during after hours [place], for this patient?   Check the care team in [facility] and look for a) attending/consulting [facility] provider listed and b) the [facility] team listed Log into [email or web address] and use [place]'s universal password to access. If you do not have the password, please contact the hospital operator. Locate the [facility] provider you are looking for under [facility] and page to a number that you can be directly reached. If you still have difficulty reaching the provider, please page the [facility] (Director on Call) for the Hospitalists listed on amion for assistance.  02/08/2022, 6:49 PM

## 2022-02-08 NOTE — ED Provider Triage Note (Signed)
Emergency Medicine Provider Triage Evaluation Note  Jimmy Blankenship , a 71 y.o. male  was evaluated in triage.  Pt complains of melena.  Patient has a known history of stage IV gastric cancer that he is actively receiving chemotherapy for.  He receives blood transfusions fairly regularly over review of records.  4 days ago he began to have black tarry stools.  He had an office visit on 02/07/2022 with a hemoglobin of 8.7 and heme positive stool.  GI recommended him come to [facility] for admission and further work-up.  He just received blood transfusion earlier today.  Accompanied symptoms are shortness of breath.  Associated symptoms include diffuse abdominal "bloating type feeling".  He denies chest pain, nausea/vomiting/diarrhea, hematemesis, fever, chills, night sweats.  Last bowel movement was earlier today.  Review of Systems  Positive:  Negative: See above  Physical Exam  BP (!) 85/64 (BP Location: Left Arm)   Pulse 81   Temp 97.6 F (36.4 C) (Oral)   Resp 16   SpO2 97%  Gen:   Awake, no distress   Resp:  Normal effort  MSK:   Moves extremities without difficulty  Other:  No point tenderness upon abdominal exam.  Medical Decision Making  Medically screening exam initiated at 3:47 PM.  Appropriate orders placed.  Asar Clevester Helzer was informed that the remainder of the evaluation will be completed by another provider, this initial triage assessment does not replace that evaluation, and the importance of remaining in the ED until their evaluation is complete.     Wilnette Kales, [place] 02/08/22 1554

## 2022-02-08 NOTE — Telephone Encounter (Signed)
Notified Pt daughter Diona Browner of Dr. Lyndel Safe recommendation: Judson Roch stated that pt was currently getting a blood transfusion at Dr. Angelica Chessman and that they would go to the ED at [facility] after that for admission  Sarah verbalized understanding with all questions answered.

## 2022-02-08 NOTE — Patient Instructions (Signed)
Blood Transfusion, Adult, Care After ?This sheet gives you information about how to care for yourself after your procedure. Your health care provider may also give you more specific instructions. If you have problems or questions, contact your health care provider. ?What can I expect after the procedure? ?After the procedure, it is common to have: ?Bruising and soreness where the IV was inserted. ?A headache. ?Follow these instructions at home: ?IV insertion site care ? ?  ? ?Follow instructions from your health care provider about how to take care of your IV insertion site. Make sure you: ?Wash your hands with soap and water before and after you change your bandage (dressing). If soap and water are not available, use hand sanitizer. ?Change your dressing as told by your health care provider. ?Check your IV insertion site every day for signs of infection. Check for: ?Redness, swelling, or pain. ?Bleeding from the site. ?Warmth. ?Pus or a bad smell. ?General instructions ?Take over-the-counter and prescription medicines only as told by your health care provider. ?Rest as told by your health care provider. ?Return to your normal activities as told by your health care provider. ?Keep all follow-up visits as told by your health care provider. This is important. ?Contact a health care provider if: ?You have itching or red, swollen areas of skin (hives). ?You feel anxious. ?You feel weak after doing your normal activities. ?You have redness, swelling, warmth, or pain around the IV insertion site. ?You have blood coming from the IV insertion site that does not stop with pressure. ?You have pus or a bad smell coming from your IV insertion site. ?Get help right away if: ?You have symptoms of a serious allergic or immune system reaction, including: ?Trouble breathing or shortness of breath. ?Swelling of the face or feeling flushed. ?Fever or chills. ?Pain in the head, back, or chest. ?Dark urine or blood in the  urine. ?Widespread rash. ?Fast heartbeat. ?Feeling dizzy or light-headed. ?If you receive your blood transfusion in an outpatient setting, you will be told whom to contact to report any reactions. ?These symptoms may represent a serious problem that is an emergency. Do not wait to see if the symptoms will go away. Get medical help right away. Call your local emergency services (911 in the [place] Do not drive yourself to the hospital. ?Summary ?Bruising and tenderness around the IV insertion site are common. ?Check your IV insertion site every day for signs of infection. ?Rest as told by your health care provider. Return to your normal activities as told by your health care provider. ?Get help right away for symptoms of a serious allergic or immune system reaction to blood transfusion. ?This information is not intended to replace advice given to you by your health care provider. Make sure you discuss any questions you have with your health care provider. ?Document Revised: 12/22/2020 Document Reviewed: 02/19/2019 ?Elsevier Patient Education ? 2023 [employer] ? ?

## 2022-02-08 NOTE — Telephone Encounter (Signed)
Hi Amy, FYI RG

## 2022-02-08 NOTE — Telephone Encounter (Signed)
-----   Message from Jackquline Denmark, MD sent at 02/07/2022  7:42 PM EDT ----- Regarding: semi-urgent [name], Pl tell this pt to come over to [facility] ED for adm 6/1 and let me know RG  ----- Message ----- From: Volanda Napoleon, MD Sent: 02/07/2022  12:39 PM EDT To: Jackquline Denmark, MD  Merrie Roof: I need your help quickly.  Mr. Whirley seems to be bleeding again.  He comes in with a hemoglobin of 8.7 today.  His stool is black and grossly heme positive.  Can you please do an upper endoscopy on him again?   I have to believe that he is bleeding from cancer.  We need to know this so we can change our protocol.  Please make sure you call his daughter to get this scheduled.  I really hate the fact that you all are not across the hall from [place] now.  I hope that you and your family had a good Memorial Day weekend.  Laurey Arrow

## 2022-02-09 ENCOUNTER — Encounter (HOSPITAL_COMMUNITY): Payer: Self-pay | Admitting: Internal Medicine

## 2022-02-09 ENCOUNTER — Observation Stay (HOSPITAL_COMMUNITY): Payer: Medicare Other

## 2022-02-09 DIAGNOSIS — K921 Melena: Secondary | ICD-10-CM

## 2022-02-09 DIAGNOSIS — I255 Ischemic cardiomyopathy: Secondary | ICD-10-CM | POA: Diagnosis present

## 2022-02-09 DIAGNOSIS — Z7984 Long term (current) use of oral hypoglycemic drugs: Secondary | ICD-10-CM | POA: Diagnosis not present

## 2022-02-09 DIAGNOSIS — I10 Essential (primary) hypertension: Secondary | ICD-10-CM | POA: Diagnosis not present

## 2022-02-09 DIAGNOSIS — C799 Secondary malignant neoplasm of unspecified site: Secondary | ICD-10-CM | POA: Diagnosis not present

## 2022-02-09 DIAGNOSIS — Z87891 Personal history of nicotine dependence: Secondary | ICD-10-CM | POA: Diagnosis not present

## 2022-02-09 DIAGNOSIS — C787 Secondary malignant neoplasm of liver and intrahepatic bile duct: Secondary | ICD-10-CM

## 2022-02-09 DIAGNOSIS — R5383 Other fatigue: Secondary | ICD-10-CM

## 2022-02-09 DIAGNOSIS — Z794 Long term (current) use of insulin: Secondary | ICD-10-CM | POA: Diagnosis not present

## 2022-02-09 DIAGNOSIS — D61818 Other pancytopenia: Secondary | ICD-10-CM | POA: Diagnosis present

## 2022-02-09 DIAGNOSIS — K254 Chronic or unspecified gastric ulcer with hemorrhage: Secondary | ICD-10-CM

## 2022-02-09 DIAGNOSIS — R0602 Shortness of breath: Secondary | ICD-10-CM

## 2022-02-09 DIAGNOSIS — R109 Unspecified abdominal pain: Secondary | ICD-10-CM

## 2022-02-09 DIAGNOSIS — I252 Old myocardial infarction: Secondary | ICD-10-CM | POA: Diagnosis not present

## 2022-02-09 DIAGNOSIS — R12 Heartburn: Secondary | ICD-10-CM

## 2022-02-09 DIAGNOSIS — E7800 Pure hypercholesterolemia, unspecified: Secondary | ICD-10-CM | POA: Diagnosis present

## 2022-02-09 DIAGNOSIS — K2970 Gastritis, unspecified, without bleeding: Secondary | ICD-10-CM | POA: Diagnosis present

## 2022-02-09 DIAGNOSIS — K922 Gastrointestinal hemorrhage, unspecified: Secondary | ICD-10-CM | POA: Diagnosis present

## 2022-02-09 DIAGNOSIS — I513 Intracardiac thrombosis, not elsewhere classified: Secondary | ICD-10-CM | POA: Diagnosis present

## 2022-02-09 DIAGNOSIS — N400 Enlarged prostate without lower urinary tract symptoms: Secondary | ICD-10-CM | POA: Diagnosis present

## 2022-02-09 DIAGNOSIS — R6881 Early satiety: Secondary | ICD-10-CM | POA: Diagnosis present

## 2022-02-09 DIAGNOSIS — I5042 Chronic combined systolic (congestive) and diastolic (congestive) heart failure: Secondary | ICD-10-CM | POA: Diagnosis present

## 2022-02-09 DIAGNOSIS — C169 Malignant neoplasm of stomach, unspecified: Secondary | ICD-10-CM | POA: Diagnosis not present

## 2022-02-09 DIAGNOSIS — K3189 Other diseases of stomach and duodenum: Secondary | ICD-10-CM | POA: Diagnosis not present

## 2022-02-09 DIAGNOSIS — I2510 Atherosclerotic heart disease of native coronary artery without angina pectoris: Secondary | ICD-10-CM | POA: Diagnosis present

## 2022-02-09 DIAGNOSIS — C165 Malignant neoplasm of lesser curvature of stomach, unspecified: Secondary | ICD-10-CM | POA: Diagnosis present

## 2022-02-09 DIAGNOSIS — E1142 Type 2 diabetes mellitus with diabetic polyneuropathy: Secondary | ICD-10-CM | POA: Diagnosis present

## 2022-02-09 DIAGNOSIS — Z79899 Other long term (current) drug therapy: Secondary | ICD-10-CM | POA: Diagnosis not present

## 2022-02-09 DIAGNOSIS — Z0189 Encounter for other specified special examinations: Secondary | ICD-10-CM

## 2022-02-09 DIAGNOSIS — I110 Hypertensive heart disease with heart failure: Secondary | ICD-10-CM | POA: Diagnosis present

## 2022-02-09 DIAGNOSIS — Z9221 Personal history of antineoplastic chemotherapy: Secondary | ICD-10-CM | POA: Diagnosis not present

## 2022-02-09 DIAGNOSIS — R188 Other ascites: Secondary | ICD-10-CM | POA: Diagnosis present

## 2022-02-09 DIAGNOSIS — Z66 Do not resuscitate: Secondary | ICD-10-CM | POA: Diagnosis present

## 2022-02-09 DIAGNOSIS — K2990 Gastroduodenitis, unspecified, without bleeding: Secondary | ICD-10-CM | POA: Diagnosis present

## 2022-02-09 DIAGNOSIS — D62 Acute posthemorrhagic anemia: Secondary | ICD-10-CM | POA: Diagnosis present

## 2022-02-09 DIAGNOSIS — R140 Abdominal distension (gaseous): Secondary | ICD-10-CM | POA: Diagnosis not present

## 2022-02-09 DIAGNOSIS — D500 Iron deficiency anemia secondary to blood loss (chronic): Secondary | ICD-10-CM | POA: Diagnosis not present

## 2022-02-09 LAB — COMPREHENSIVE METABOLIC PANEL
ALT: 36 U/L (ref 0–44)
AST: 38 U/L (ref 15–41)
Albumin: 2.6 g/dL — ABNORMAL LOW (ref 3.5–5.0)
Alkaline Phosphatase: 204 U/L — ABNORMAL HIGH (ref 38–126)
Anion gap: 6 (ref 5–15)
BUN: 13 mg/dL (ref 8–23)
CO2: 21 mmol/L — ABNORMAL LOW (ref 22–32)
Calcium: 8.2 mg/dL — ABNORMAL LOW (ref 8.9–10.3)
Chloride: 113 mmol/L — ABNORMAL HIGH (ref 98–111)
Creatinine, Ser: 1.05 mg/dL (ref 0.61–1.24)
GFR, Estimated: >60 mL/min (ref >60)
Glucose, Bld: 103 mg/dL — ABNORMAL HIGH (ref 70–99)
Potassium: 3.1 mmol/L — ABNORMAL LOW (ref 3.5–5.1)
Sodium: 140 mmol/L (ref 135–145)
Total Bilirubin: 2.2 mg/dL — ABNORMAL HIGH (ref 0.3–1.2)
Total Protein: 5.1 g/dL — ABNORMAL LOW (ref 6.5–8.1)

## 2022-02-09 LAB — BPAM RBC
Blood Product Expiration Date: 202306182359
Blood Product Expiration Date: 202306192359
ISSUE DATE / TIME: 202306010751
ISSUE DATE / TIME: 202306010751
Unit Type and Rh: 6200
Unit Type and Rh: 6200

## 2022-02-09 LAB — TYPE AND SCREEN
ABO/RH(D): A POS
Antibody Screen: NEGATIVE
Unit division: 0
Unit division: 0

## 2022-02-09 LAB — CBC
HCT: 31 % — ABNORMAL LOW (ref 39.0–52.0)
Hemoglobin: 10.6 g/dL — ABNORMAL LOW (ref 13.0–17.0)
MCH: 33.3 pg (ref 26.0–34.0)
MCHC: 34.2 g/dL (ref 30.0–36.0)
MCV: 97.5 fL (ref 80.0–100.0)
Platelets: 88 10*3/uL — ABNORMAL LOW (ref 150–400)
RBC: 3.18 MIL/uL — ABNORMAL LOW (ref 4.22–5.81)
RDW: 20.9 % — ABNORMAL HIGH (ref 11.5–15.5)
WBC: 2.5 10*3/uL — ABNORMAL LOW (ref 4.0–10.5)
nRBC: 0 % (ref 0.0–0.2)

## 2022-02-09 LAB — GLUCOSE, CAPILLARY
Glucose-Capillary: 85 mg/dL (ref 70–99)
Glucose-Capillary: 114 mg/dL — ABNORMAL HIGH (ref 70–99)
Glucose-Capillary: 154 mg/dL — ABNORMAL HIGH (ref 70–99)
Glucose-Capillary: 173 mg/dL — ABNORMAL HIGH (ref 70–99)

## 2022-02-09 MED ORDER — PERFLUTREN LIPID MICROSPHERE
1.0000 mL | INTRAVENOUS | Status: AC | PRN
  Administered 2022-02-09: 2 mL via INTRAVENOUS

## 2022-02-09 MED ORDER — METOPROLOL TARTRATE 25 MG PO TABS
25.0000 mg | ORAL_TABLET | Freq: Two times a day (BID) | ORAL | Status: DC
  Administered 2022-02-09 – 2022-02-12 (×6): 25 mg via ORAL
  Filled 2022-02-09 (×6): qty 1

## 2022-02-09 NOTE — H&P (View-Only) (Signed)
Consultation  Referring Provider: [facility]  Primary Care Physician:  [name], MD Primary Gastroenterologist:  Dr.Goodwin Kamphaus  Reason for Consultation: Gastric cancer, metastatic, melena anemia  HPI: Jimmy Blankenship is a 71 y.o. male (Jimmy Blankenship), known to Dr. Lyndel Safe, and followed by Dr. Marin Olp for known diagnosis of metastatic adenocarcinoma of the stomach.  Patient was initially diagnosed in December 2021. He has history of coronary artery disease, status post stent x2, diabetes mellitus, chronic kidney disease and iron deficiency anemia. He has been undergoing chemotherapy with Enhertu. Last CT imaging 12/22/2021 revealed multiple liver metastases, there is a hypodense mass along the greater curve of the stomach measuring 6.7 x 2.9 cm. He was admitted in mid April with melena, and required blood transfusions and IV iron. He underwent EGD at that time per Dr. Lyndel Safe with finding of a localized indurated nodular mucosa with central depression in the gastric body lesser curve measuring 4 x 5 cm.  Biopsies were taken (appears specimen did not make it to pathology), he also had a 10 mm duodenal adeno polyp removed and path showed that be peptic duodenitis.  He has been having melena again over the past several days.  He was seen by Dr. Marin Olp on 02/07/2022 had melenic appearing stool at that time, hemoglobin was 8.7 hematocrit 25.6. He was transfused 1 unit as an outpatient , Then decision made to have him admitted for endoscopic evaluation, as per Dr. Antonieta Pert note because there was no obvious malignancy on the last EGD. Hemoglobin yesterday 11.1/hematocrit 33 Today hemoglobin 10.6/hematocrit 31  Pt was interviewed with wife present in room, and via [name] interpreter. He has not been having any nausea or vomiting, appetite has not been good he complains of feeling swollen or bloated after meals recently.  No Complaints of abdominal pain. He had 2 melenic stools  yesterday and 1 this morning.   Past Medical History:  Diagnosis Date   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[place]) 08/22/2020   CKD (chronic kidney disease)    Coronary Artery Disease s/p CABG    hx of PCI in 2000s in [place] // s/p NSTEMI in 7/21 >> CABG   Diabetes mellitus 2    Goals of care, counseling/discussion 08/22/2020   Heart failure with reduced ejection fraction    Ischemic CM // Echo 7/21 - EF 35 // Intraop TEE 7/21: EF 40-45 // Echocardiogram 9/21: apical AK, EF 40-45, Gr 2 DD, trace AI, mild MR, mild LAE, normal RVSF, no pericardial effusion   High cholesterol    Hypertension    Iron deficiency anemia    Heme + stools (during admit for NSTEMI in 7/21)   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 Normal per patient. [place]   COLONOSCOPY N/A 08/15/2020   Procedure: COLONOSCOPY;  Surgeon: Ladene Artist, MD;  Location: [place];  Service: Gastroenterology;  Laterality: N/A;   CORONARY ANGIOPLASTY WITH STENT PLACEMENT     CORONARY ARTERY BYPASS  GRAFT N/A 03/24/2020   Procedure: CORONARY ARTERY BYPASS GRAFTING (CABG), ON PUMP, TIMES THREE, USING LEFT INTERNAL MAMMARY ARTERY AND RIGHT ENDOSCOPICALLY HARVESTED GREATER SAPHENOUS VEIN;  Surgeon: Ivin Poot, MD;  Location: [place];  Service: Open Heart Surgery;  Laterality: N/A;   ESOPHAGOGASTRODUODENOSCOPY N/A 08/15/2020   Procedure: ESOPHAGOGASTRODUODENOSCOPY (EGD);  Surgeon: Ladene Artist, MD;  Location: [place];  Service:  Gastroenterology;  Laterality: N/A;   ESOPHAGOGASTRODUODENOSCOPY (EGD) WITH PROPOFOL N/A 12/29/2021   Procedure: ESOPHAGOGASTRODUODENOSCOPY (EGD) WITH PROPOFOL;  Surgeon: Jackquline Denmark, MD;  Location: [facility];  Service: Gastroenterology;  Laterality: N/A;   IR IMAGING GUIDED PORT INSERTION  09/16/2020   LEFT HEART CATH AND CORONARY ANGIOGRAPHY N/A 03/17/2020   Procedure: LEFT HEART CATH AND CORONARY ANGIOGRAPHY;  Surgeon: Belva Crome, MD;  Location: [facility];  Service: Cardiovascular;  Laterality: N/A;   POLYPECTOMY  08/15/2020   Procedure: POLYPECTOMY;  Surgeon: Ladene Artist, MD;  Location: [facility];  Service: Gastroenterology;;   TEE WITHOUT CARDIOVERSION N/A 03/24/2020   Procedure: TRANSESOPHAGEAL ECHOCARDIOGRAM (TEE);  Surgeon: [name], Collier Salina, MD;  Location: [place];  Service: Open Heart Surgery;  Laterality: N/A;    Prior to Admission medications   Medication Sig Start Date End Date Taking? Authorizing Provider  atorvastatin (LIPITOR) 40 MG tablet Take 80 mg by mouth daily.   Yes [provider]  diphenhydrAMINE (BENADRYL) 25 MG tablet Take 25 mg by mouth at bedtime as needed for itching.   Yes [provider]  diphenoxylate-atropine (LOMOTIL) 2.5-0.025 MG tablet TAKE ONE TABLET BY MOUTH FOUR TIMES A DAY AS NEEDED FOR DIARRHEA OR LOOSE STOOLS Patient taking differently: Take 1 tablet by mouth 4 (four) times daily as needed for diarrhea or loose stools. 10/31/21  Yes Ennever, Rudell Cobb, MD  feeding supplement (ENSURE ENLIVE / ENSURE PLUS) LIQD Take 237 mLs by mouth 2 (two) times daily between meals. Patient taking differently: Take 237 mLs by mouth daily. 08/16/20  Yes Regalado, Belkys A, MD  gabapentin (NEURONTIN) 300 MG capsule Take 1 capsule (300 mg total) by mouth 4 (four) times daily. 10/31/21  Yes Volanda Napoleon, MD  glipiZIDE (GLUCOTROL) 5 MG tablet Take 5 mg by mouth 2 (two) times daily before a meal. 03/09/21  Yes [provider]  insulin  glargine (LANTUS) 100 unit/mL SOPN Inject 10 Units into the skin daily as needed (for blood sugar over 200).   Yes [provider]  lactulose (CHRONULAC) 10 GM/15ML solution Take 15 mLs (10 g total) by mouth 2 (two) times daily as needed for mild constipation. 01/24/22  Yes Celso Amy, NP  lidocaine-prilocaine (EMLA) cream Apply to affected area once Patient taking differently: 1 application. daily as needed (port access). 02/01/21  Yes Ennever, Rudell Cobb, MD  LORazepam (ATIVAN) 0.5 MG tablet Take 1 tablet (0.5 mg total) by mouth every 6 (six) hours as needed (Nausea or vomiting). 02/01/21  Yes Volanda Napoleon, MD  megestrol (MEGACE) 400 MG/10ML suspension Take 10 mLs (400 mg total) by mouth 2 (two) times daily. Patient taking differently: Take 400 mg by mouth 2 (two) times daily as needed (appetite). 02/21/21  Yes Ennever, Rudell Cobb, MD  melatonin 5 MG TABS Take 5 mg by mouth at bedtime.   Yes [provider]  metFORMIN (GLUCOPHAGE) 1000 MG tablet Take 1,000 mg by mouth 2 (two) times daily with a meal. 05/18/21  Yes [provider]  metoprolol tartrate (LOPRESSOR) 50 MG tablet TAKE ONE TABLET BY  MOUTH TWICE A DAY Patient taking differently: Take 50 mg by mouth 2 (two) times daily. 11/16/20  Yes Nahser, Wonda Cheng, MD  ondansetron (ZOFRAN) 8 MG tablet Take 1 tablet (8 mg total) by mouth 2 (two) times daily as needed for refractory nausea / vomiting. Start on day 3 after chemo. 02/01/21  Yes Volanda Napoleon, MD  pantoprazole (PROTONIX) 40 MG tablet Take 1 tablet (40 mg total) by mouth 2 (two) times daily. Follow up with gastroenterology for additional instructions 12/30/21 02/28/22 Yes Elodia Florence., MD  prochlorperazine (COMPAZINE) 10 MG tablet Take 1 tablet (10 mg total) by mouth every 6 (six) hours as needed (Nausea or vomiting). Patient taking differently: Take 10 mg by mouth every 6 (six) hours as needed for nausea or vomiting (Nausea or vomiting). 02/01/21  Yes Volanda Napoleon, MD  tamsulosin (FLOMAX) 0.4 MG CAPS capsule Take 0.4 mg by mouth at bedtime. 08/07/21  Yes [provider]  dexamethasone (DECADRON) 4 MG tablet Take 2 tablets (8 mg total) by mouth daily. Start the day after chemotherapy for 2 days. 02/01/21   Volanda Napoleon, MD  sucralfate (CARAFATE) 1 g tablet Take 1 tablet (1 g total) by mouth 4 (four) times daily -  with meals and at bedtime for 14 days. 12/30/21 01/13/22  Elodia Florence., MD    Current Facility-Administered Medications  Medication Dose Route Frequency Provider Last Rate Last Admin   acetaminophen (TYLENOL) tablet 650 mg  650 mg Oral Q6H PRN Marcelyn Bruins, MD       Or   acetaminophen (TYLENOL) suppository 650 mg  650 mg Rectal Q6H PRN Marcelyn Bruins, MD       atorvastatin (LIPITOR) tablet 80 mg  80 mg Oral Daily Marcelyn Bruins, MD       diphenhydrAMINE (BENADRYL) capsule 25 mg  25 mg Oral QHS PRN Marcelyn Bruins, MD       feeding supplement (ENSURE ENLIVE / ENSURE PLUS) liquid 237 mL  237 mL Oral BID BM Marcelyn Bruins, MD       gabapentin (NEURONTIN) capsule 300 mg  300 mg Oral TID Marcelyn Bruins, MD   300 mg at 02/08/22 2214   insulin aspart (novoLOG) injection 0-9 Units  0-9 Units Subcutaneous TID WC Marcelyn Bruins, MD       megestrol (MEGACE) 400 MG/10ML suspension 400 mg  400 mg Oral BID Marcelyn Bruins, MD       melatonin tablet 5 mg  5 mg Oral QHS PRN Marcelyn Bruins, MD   5 mg at 02/08/22 2300   metoprolol tartrate (LOPRESSOR) tablet 50 mg  50 mg Oral BID Marcelyn Bruins, MD       pantoprazole (PROTONIX) EC tablet 40 mg  40 mg Oral BID Marcelyn Bruins, MD       sodium chloride flush (NS) 0.9 % injection 3 mL  3 mL Intravenous Q12H Marcelyn Bruins, MD   3 mL at 02/08/22 2106   tamsulosin (FLOMAX) capsule 0.4 mg  0.4 mg Oral QHS Marcelyn Bruins, MD   0.4 mg at 02/08/22 2214   Facility-Administered Medications Ordered in Other Encounters  Medication  Dose Route Frequency Provider Last Rate Last Admin   sodium chloride flush (NS) 0.9 % injection 10 mL  10 mL Intravenous PRN Volanda Napoleon, MD        Allergies as of 02/08/2022   (Not on File)  Family History  Problem Relation Age of Onset   Atrial fibrillation Mother    Congestive Heart Failure Mother    Cancer Neg Hx     Social History   Socioeconomic History   Marital status: Married    Spouse name: Not on file   Number of children: Not on file   Years of education: Not on file   Highest education level: Not on file  Occupational History   Occupation: Advertising account executive  Tobacco Use   Smoking status: Former    Packs/day: 1.00    Years: 30.00    Pack years: 30.00    Types: Cigarettes    Quit date: 2006    Years since quitting: 17.4   Smokeless tobacco: Never   Tobacco comments:    quit 2008  Vaping Use   Vaping Use: Never used  Substance and Sexual Activity   Alcohol use: Never   Drug use: Never   Sexual activity: Not on file  Other Topics Concern   Not on file  Social History Narrative   Not on file   Social Determinants of Health   Financial Resource Strain: Not on file  Food Insecurity: Not on file  Transportation Needs: Not on file  Physical Activity: Not on file  Stress: Not on file  Social Connections: Not on file  Intimate Partner Violence: Not on file    Review of Systems: Pertinent positive and negative review of systems were noted in the above HPI section.  All other review of systems was otherwise negative.  Physical Exam: Vital signs in last 24 hours: Temp:  [97.6 F (36.4 C)-98.9 F (37.2 C)] 98.4 F (36.9 C) (06/02 0500) Pulse Rate:  [70-90] 90 (06/02 0500) Resp:  [16-25] 17 (06/02 0500) BP: (85-115)/(51-76) 91/62 (06/02 0500) SpO2:  [96 %-100 %] 96 % (06/02 0500)   General:   Alert,  Well-developed, older [name] male, wife at bedside pleasant and cooperative in NAD Head:  Normocephalic and atraumatic. Eyes:  Sclera clear,  no icterus.   Conjunctiva pink. Ears:  Normal auditory acuity. Nose:  No deformity, discharge,  or lesions. Mouth:  No deformity or lesions.   Neck:  Supple; no masses or thyromegaly. Lungs:  Clear throughout to auscultation.   No wheezes, crackles, or rhonchi.  Heart:  Regular rate and rhythm; no murmurs, clicks, rubs,  or gallops.  Sternal incisional scar Abdomen:  Soft, mild tenderness in the epigastrium, no definite palpable mass, no definite fluid wave but abdomen feels full. Rectal: Not done, Melena documented per Dr. Marin Olp Msk:  Symmetrical without gross deformities. . Pulses:  Normal pulses noted. Extremities:  Without clubbing or edema. Neurologic:  Alert and  oriented x4;  grossly normal neurologically. Skin:  Intact without significant lesions or rashes.. Psych:  Alert and cooperative. Normal mood and affect.  Intake/Output from previous day: No intake/output data recorded. Intake/Output this shift: No intake/output data recorded.  Lab Results: Recent Labs    02/07/22 1012 02/08/22 1553 02/09/22 0524  WBC 2.9* 2.4* 2.5*  HGB 8.7* 11.1* 10.6*  HCT 25.6* 33.0* 31.0*  PLT 127* 112* 88*   BMET Recent Labs    02/07/22 1012 02/08/22 1553 02/09/22 0524  NA 141 141 140  K 3.1* 3.3* 3.1*  CL 110 112* 113*  CO2 20* 18* 21*  GLUCOSE 182* 144* 103*  BUN '13 14 13  '$ CREATININE 0.96 1.21 1.05  CALCIUM 8.6* 8.6* 8.2*   LFT Recent Labs    02/09/22 0524  PROT 5.1*  ALBUMIN 2.6*  AST 38  ALT 36  ALKPHOS 204*  BILITOT 2.2*   PT/INR No results for input(s): LABPROT, INR in the last 72 hours. Hepatitis Panel No results for input(s): HEPBSAG, HCVAB, HEPAIGM, HEPBIGM in the last 72 hours.    IMPRESSION:  #18 71 year old [name] male, with known metastatic adenocarcinoma of the stomach to the liver.  Initially diagnosed December 2021.  Patient has been undergoing chemotherapy. Presents now with melena over the past 3 to 4 days. He had similar presentation in April  2023 and underwent EGD at that time with evidence of a nodular gastric mass with central depression but no definite ulceration.  Unfortunately biopsy specimen does not appear to have been sent to pathology. Recent CT imaging April 2023 also showed a gastric mass measuring 6.7 x 1.9 cm along the greater curve, multiple liver mets and a small amount of ascites  He is certainly bleeding from development of ulceration of the known gastric malignancy.  #2 abdominal fullness and swelling-suspect increasing abdominal ascites #3 anemia secondary to above-stable post outpatient transfusion earlier this week #4 coronary artery disease status post CABG #5  Diabetes mellitus #6.  History of just of heart failure #7.  Chronic kidney disease  Plan; liquid diet today, n.p.o. after midnight We are unable to schedule for EGD today, this will be scheduled for Dr. Lyndel Safe for tomorrow a.m. 02/10/2022.  Procedure was discussed in detail with the patient and wife at bedside via the interpreter, and they agree to proceed. Continue to trend hemoglobin, transfuse as indicated Consider abdominal ultrasound to assess for increased ascites  Will likely need radiation to help with bleeding from known gastric adenocarcinoma, as there is no endoscopic therapy that can be used in this setting.     Amy Esterwood PA-C 02/09/2022, 8:49 AM     Attending physician's note   I have taken history, reviewed the chart and examined the patient. I performed a substantive portion of this encounter, including complete performance of at least one of the key components, in conjunction with the APP. I agree with the Advanced Practitioner's note, impression and recommendations.    Metastatic gastric AdenoCa with liver mets (dx Dec 2021) on chemo, with recurrent melena req frequent transfusions (s/p 2U yesterday). CT AP 12/2021 shows persistent gastric mass. No new liver mets. Has likely new ascites. Prev EGD 12/2021 showed nodular mucosa in  the gastric body, along the lesser curvature of the stomach and incisura. Biopsied (send rush- never made it to path), duodenal polyp (Bx- peptic duodenitis)  Pancytopenia likely d/t ?Enhertu  H/O ventricular thrombus.  Off Xarelto since April 2023.  2DE done today.   CHF with EF 40-45%. CAD s/p CABG  Comorbidities also include CKD, DM 2   Plan: -IV Protonix -EGD tomorrow with Dr. Bryan Lemma.  Scheduled at 730. Pt does understand limited Endo options. -Trend CBC -[place] next week. If + ascites, tap. Send fluid for TP, alb, cytology and cell count. -May need to involve IR for embolization (if feasible) or radiation oncology for palliative XRT depending upon above EGD findings.   Carmell Austria, MD [name] [phone]

## 2022-02-09 NOTE — Progress Notes (Signed)
Consent for upper endoscopy obtained using [name] interpreter, Pender [phone]. Patient and spouse state understanding of procedure. Patient asked that his wife sign consent for him.

## 2022-02-09 NOTE — Progress Notes (Signed)
  Echocardiogram 2D Echocardiogram has been performed.  Joette Catching 02/09/2022, 11:52 AM

## 2022-02-09 NOTE — Anesthesia Preprocedure Evaluation (Addendum)
Anesthesia Evaluation  Patient identified by MRN, date of birth, ID band Patient awake    Reviewed: Allergy & Precautions, NPO status , Patient's Chart, lab work & pertinent test results  Airway Mallampati: II  TM Distance: >3 FB Neck ROM: Full    Dental  (+) Dental Advisory Given, Poor Dentition   Pulmonary former smoker,    breath sounds clear to auscultation       Cardiovascular hypertension, + CAD, + Cardiac Stents, +CHF and + DOE   Rhythm:Regular Rate:Normal  Echo: 1. Left ventricular ejection fraction, by estimation, is 40-45%. The left  ventricle has mild-to-moderately decreased function. The left ventricle  demonstrates regional wall motion abnormalities. The mid-to-apical septal  segments, all apical segments and  apex are akinetic. There is mild concentric left ventricular hypertrophy.  Left ventricular diastolic parameters are consistent with Grade I  diastolic dysfunction (impaired relaxation). No LV thrombus visualized.  2. Right ventricular systolic function is moderately reduced. The right  ventricular size is normal.  3. The mitral valve is abnormal. There is tethering of the posterior  mitral valve leaflet with resultant mild-to-moderate, posteriorly directed  mitral valve regurgitation.  4. The aortic valve is tricuspid. There is mild thickening of the aortic  valve. Aortic valve regurgitation is trivial. Aortic valve sclerosis is  present, with no evidence of aortic valve stenosis.  5. Aortic dilatation noted. There is mild dilatation of the aortic root,  measuring 38 mm. There is mild dilatation of the ascending aorta,  measuring 37 mm.  6. The inferior vena cava is normal in size with greater than 50%  respiratory variability, suggesting right atrial pressure of 3 mmHg.  7. Left atrial size was mildly dilated.    Neuro/Psych  Neuromuscular disease negative psych ROS   GI/Hepatic negative GI ROS,  Neg liver ROS,   Endo/Other  diabetes  Renal/GU Renal disease     Musculoskeletal negative musculoskeletal ROS (+)   Abdominal Normal abdominal exam  (+)   Peds  Hematology negative hematology ROS (+)   Anesthesia Other Findings   Reproductive/Obstetrics                            Anesthesia Physical Anesthesia Plan  ASA: 3  Anesthesia Plan: MAC   Post-op Pain Management:    Induction: Intravenous  PONV Risk Score and Plan: 0 and Propofol infusion  Airway Management Planned: Natural Airway and Simple Face Mask  Additional Equipment: None  Intra-op Plan:   Post-operative Plan:   Informed Consent: I have reviewed the patients History and Physical, chart, labs and discussed the procedure including the risks, benefits and alternatives for the proposed anesthesia with the patient or authorized representative who has indicated his/her understanding and acceptance.       Plan Discussed with: CRNA  Anesthesia Plan Comments:        Anesthesia Quick Evaluation

## 2022-02-09 NOTE — Progress Notes (Signed)
Jimmy Blankenship was admitted for upper endoscopy.  He has metastatic gastric cancer.  He has been on Enhertu.  He started to have some bleeding earlier this week.  We saw him in the office on Wednesday.  We did a rectal exam on him which was grossly heme positive.  He had 2 units of blood yesterday.  He subsequently was admitted last night.  Dr. Lyndel Safe will do the endoscopy on him.  His labs show white count 2.5.  Hemoglobin 10.6.  Platelet count 88,000.  His bilirubin is 2.2.  Albumin 2.6.  His BUN is 13 creatinine 1.05.  Blood sugar is 103.  He is not been having any obvious abdominal pain.  He has had some bloating.  He does have this intracardiac thrombus.  Had been on anticoagulation for this but we have stopped this because of the bleeding.  He does have a obvious bleeding tumor in his stomach, that would get Radiation Oncology to see him and see about doing palliative radiation to his stomach.  For right now, he is n.p.o.  Overall, I think he is done incredibly well for his disease.  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  While he is in the hospital, I will get an echocardiogram on him.  He needs to have 1 done anyway for his treatment protocol.  All of his vital signs are stable.  Temperature 98.4.  Pulse 90.  Blood pressure 91/62.  His physical exam shows his lungs to be clear.  Cardiac exam regular rate and rhythm.  Abdomen is soft.  There may be a little bit of distention.  He has no fluid wave.  There is no palpable liver or spleen tip.  Extremity shows no clubbing, cyanosis or edema.  We will have to see what the upper endoscopy shows.  I am sure biopsies will be taken.  Whether or not he will need another transfusion will depend on his blood counts.  I know he will get incredible care from everybody up on 6 E.  I appreciate all of the the staff's hard work and compassion.   Jimmy Haw, MD  Psalms 147:3

## 2022-02-09 NOTE — Progress Notes (Signed)
PROGRESS NOTE    Jimmy Blankenship  IOX:[id number] DOB: December 21, 1950 DOA: 02/08/2022 PCP: [name], MD    Brief Narrative:  Jimmy Blankenship is a 71 y.o. male with medical history significant of stomach cancer with mets and GI bleeding/anemia, CAD status post CABG, CHF, diabetes, hyperlipidemia, hypertension, BPH, CKD, LV thrombus presenting with GI bleeding and symptomatic anemia.  EGD planned for the AM.   Assessment and Plan: GI bleed/Symptomatic anemia > Patient presenting with GI bleeding and symptomatic anemia in the setting of gastric cancer as below. > Received 2 units outpatient ordered by oncology and is being admitted for endoscopic evaluation for possible progression of stomach cancer > trend CBC   Gastric cancer > Known history of gastric cancer with liver mets and recurrent GI bleeding and symptomatic anemia. > Currently on Enhertu and iron supplementation > Follows with Dr. Marin Olp who saw patient yesterday and his sent patient in for endoscopic evaluation consider recurrent GI bleeding and need to evaluate for disease burden - GI consult: plan for EGD in AM -oncology: does have a obvious bleeding tumor in his stomach, that would get Radiation Oncology to see him and see about doing palliative radiation to his stomach.  Echo ordered  CAD > Status post CABG - Continue home atorvastatin, metoprolol  CHF > Last echo in 2020 with EF 40-45%, G1 DD, moderately reduced RV function > Not currently on any diuretics - Continue home metoprolol  Diabetes - SSI  Hyperlipidemia - Continue home atorvastatin  Hypertension > Low normal blood pressure in the ED - Continue home metoprolol  BPH - Continue home tamsulosin  CKD > Creatinine near baseline at 1.2      DVT prophylaxis: SCDs Start: 02/08/22 1847    Code Status: DNR   Disposition Plan:  Level of care: Telemetry Status is: Observation The patient will require care spanning > 2 midnights and should be  moved to inpatient because: needs EGD    Consultants:  GI oncology   Subjective: EGD planned for AM, no overnight events  Objective: Vitals:   02/08/22 2021 02/09/22 0009 02/09/22 0500 02/09/22 1011  BP: 1'15/76 99/61 91/62 '$ 110/72  Pulse: 79 73 90 92  Resp: '17 17 17   '$ Temp: 97.7 F (36.5 C) 98.9 F (37.2 C) 98.4 F (36.9 C)   TempSrc: Oral Oral Oral   SpO2: 98% 96% 96%    No intake or output data in the 24 hours ending 02/09/22 1129 There were no vitals filed for this visit.  Examination:   General: Appearance:    Well developed, well nourished male in no acute distress     Lungs:     respirations unlabored  Heart:    Normal heart rate. Normal rhythm. No murmurs, rubs, or gallops.    MS:   All extremities are intact.    Neurologic:   Awake, alert       Data Reviewed: I have personally reviewed following labs and imaging studies  CBC: Recent Labs  Lab 02/07/22 1012 02/08/22 1553 02/09/22 0524  WBC 2.9* 2.4* 2.5*  NEUTROABS 1.9  --   --   HGB 8.7* 11.1* 10.6*  HCT 25.6* 33.0* 31.0*  MCV 101.2* 98.2 97.5  PLT 127* 112* 88*   Basic Metabolic Panel: Recent Labs  Lab 02/07/22 1012 02/08/22 1553 02/09/22 0524  NA 141 141 140  K 3.1* 3.3* 3.1*  CL 110 112* 113*  CO2 20* 18* 21*  GLUCOSE 182* 144* 103*  BUN  $'13 14 13  'O$ CREATININE 0.96 1.21 1.05  CALCIUM 8.6* 8.6* 8.2*  MG  --  1.4*  --    GFR: CrCl cannot be calculated (Unknown ideal weight.). Liver Function Tests: Recent Labs  Lab 02/07/22 1012 02/08/22 1553 02/09/22 0524  AST 37 43* 38  ALT 36 41 36  ALKPHOS 225* 214* 204*  BILITOT 1.5* 2.9* 2.2*  PROT 5.4* 5.3* 5.1*  ALBUMIN 3.3* 2.9* 2.6*   No results for input(s): LIPASE, AMYLASE in the last 168 hours. No results for input(s): AMMONIA in the last 168 hours. Coagulation Profile: No results for input(s): INR, PROTIME in the last 168 hours. Cardiac Enzymes: No results for input(s): CKTOTAL, CKMB, CKMBINDEX, TROPONINI in the last 168  hours. BNP (last 3 results) No results for input(s): PROBNP in the last 8760 hours. HbA1C: No results for input(s): HGBA1C in the last 72 hours. CBG: Recent Labs  Lab 02/08/22 2144 02/09/22 0734  GLUCAP 103* 85   Lipid Profile: No results for input(s): CHOL, HDL, LDLCALC, TRIG, CHOLHDL, LDLDIRECT in the last 72 hours. Thyroid Function Tests: No results for input(s): TSH, T4TOTAL, FREET4, T3FREE, THYROIDAB in the last 72 hours. Anemia Panel: No results for input(s): VITAMINB12, FOLATE, FERRITIN, TIBC, IRON, RETICCTPCT in the last 72 hours. Sepsis Labs: No results for input(s): PROCALCITON, LATICACIDVEN in the last 168 hours.  No results found for this or any previous visit (from the past 240 hour(s)).       Radiology Studies: No results found.      Scheduled Meds:  atorvastatin  80 mg Oral Daily   feeding supplement  237 mL Oral BID BM   gabapentin  300 mg Oral TID   insulin aspart  0-9 Units Subcutaneous TID WC   megestrol  400 mg Oral BID   metoprolol tartrate  50 mg Oral BID   pantoprazole  40 mg Oral BID   sodium chloride flush  3 mL Intravenous Q12H   tamsulosin  0.4 mg Oral QHS   Continuous Infusions:   LOS: 0 days    Time spent: 55 minutes spent on chart review, discussion with nursing staff, consultants, updating family and interview/physical exam; more than 50% of that time was spent in counseling and/or coordination of care.    Geradine Girt, DO [facility] Available via Epic secure chat 7am-7pm After these hours, please refer to coverage provider listed on [email or web address] 02/09/2022, 11:29 AM

## 2022-02-09 NOTE — Consult Note (Addendum)
Consultation  Referring Provider: [facility]  Primary Care Physician:  [name], MD Primary Gastroenterologist:  Dr.Shadiamond Koska  Reason for Consultation: Gastric cancer, metastatic, melena anemia  HPI: Jimmy Blankenship is a 71 y.o. male ([place]), known to Dr. Lyndel Safe, and followed by Dr. Marin Olp for known diagnosis of metastatic adenocarcinoma of the stomach.  Patient was initially diagnosed in December 2021. He has history of coronary artery disease, status post stent x2, diabetes mellitus, chronic kidney disease and iron deficiency anemia. He has been undergoing chemotherapy with Enhertu. Last CT imaging 12/22/2021 revealed multiple liver metastases, there is a hypodense mass along the greater curve of the stomach measuring 6.7 x 2.9 cm. He was admitted in mid April with melena, and required blood transfusions and IV iron. He underwent EGD at that time per Dr. Lyndel Safe with finding of a localized indurated nodular mucosa with central depression in the gastric body lesser curve measuring 4 x 5 cm.  Biopsies were taken (appears specimen did not make it to pathology), he also had a 10 mm duodenal adeno polyp removed and path showed that be peptic duodenitis.  He has been having melena again over the past several days.  He was seen by Dr. Marin Olp on 02/07/2022 had melenic appearing stool at that time, hemoglobin was 8.7 hematocrit 25.6. He was transfused 1 unit as an outpatient , Then decision made to have him admitted for endoscopic evaluation, as per Dr. Antonieta Pert note because there was no obvious malignancy on the last EGD. Hemoglobin yesterday 11.1/hematocrit 33 Today hemoglobin 10.6/hematocrit 31  Pt was interviewed with wife present in room, and via [name] interpreter. He has not been having any nausea or vomiting, appetite has not been good he complains of feeling swollen or bloated after meals recently.  No Complaints of abdominal pain. He had 2 melenic stools  yesterday and 1 this morning.   Past Medical History:  Diagnosis Date   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[place]) 08/22/2020   CKD (chronic kidney disease)    Coronary Artery Disease s/p CABG    hx of PCI in 2000s in [place] // s/p NSTEMI in 7/21 >> CABG   Diabetes mellitus 2    Goals of care, counseling/discussion 08/22/2020   Heart failure with reduced ejection fraction    Ischemic CM // Echo 7/21 - EF 35 // Intraop TEE 7/21: EF 40-45 // Echocardiogram 9/21: apical AK, EF 40-45, Gr 2 DD, trace AI, mild MR, mild LAE, normal RVSF, no pericardial effusion   High cholesterol    Hypertension    Iron deficiency anemia    Heme + stools (during admit for NSTEMI in 7/21)   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 ([place]) 08/22/2020   Palpitations    Event monitor 10/21: NSR, rare PVCs, rare 4 beats NSVT, rare brief non-sustained SVT    Past Surgical History:  Procedure Laterality Date   BIOPSY  08/15/2020   Procedure: BIOPSY;  Surgeon: Ladene Artist, MD;  Location: [place];  Service: Gastroenterology;;   BIOPSY  12/29/2021   Procedure: BIOPSY;  Surgeon: Jackquline Denmark, MD;  Location: [facility];  Service: Gastroenterology;;   COLONOSCOPY     around 2013. Normal per patient. [place]   COLONOSCOPY N/A 08/15/2020   Procedure: COLONOSCOPY;  Surgeon: Ladene Artist, MD;  Location: [place];  Service: Gastroenterology;  Laterality: N/A;   CORONARY ANGIOPLASTY WITH STENT PLACEMENT     CORONARY ARTERY BYPASS  GRAFT N/A 03/24/2020   Procedure: CORONARY ARTERY BYPASS GRAFTING (CABG), ON PUMP, TIMES THREE, USING LEFT INTERNAL MAMMARY ARTERY AND RIGHT ENDOSCOPICALLY HARVESTED GREATER SAPHENOUS VEIN;  Surgeon: Ivin Poot, MD;  Location: [place];  Service: Open Heart Surgery;  Laterality: N/A;   ESOPHAGOGASTRODUODENOSCOPY N/A 08/15/2020   Procedure: ESOPHAGOGASTRODUODENOSCOPY (EGD);  Surgeon: Ladene Artist, MD;  Location: [place];  Service:  Gastroenterology;  Laterality: N/A;   ESOPHAGOGASTRODUODENOSCOPY (EGD) WITH PROPOFOL N/A 12/29/2021   Procedure: ESOPHAGOGASTRODUODENOSCOPY (EGD) WITH PROPOFOL;  Surgeon: Jackquline Denmark, MD;  Location: [facility];  Service: Gastroenterology;  Laterality: N/A;   IR IMAGING GUIDED PORT INSERTION  09/16/2020   LEFT HEART CATH AND CORONARY ANGIOGRAPHY N/A 03/17/2020   Procedure: LEFT HEART CATH AND CORONARY ANGIOGRAPHY;  Surgeon: Belva Crome, MD;  Location: [place] [facility];  Service: Cardiovascular;  Laterality: N/A;   POLYPECTOMY  08/15/2020   Procedure: POLYPECTOMY;  Surgeon: Ladene Artist, MD;  Location: [facility];  Service: Gastroenterology;;   TEE WITHOUT CARDIOVERSION N/A 03/24/2020   Procedure: TRANSESOPHAGEAL ECHOCARDIOGRAM (TEE);  Surgeon: [name], Collier Salina, MD;  Location: [place];  Service: Open Heart Surgery;  Laterality: N/A;    Prior to Admission medications   Medication Sig Start Date End Date Taking? Authorizing Provider  atorvastatin (LIPITOR) 40 MG tablet Take 80 mg by mouth daily.   Yes [provider]  diphenhydrAMINE (BENADRYL) 25 MG tablet Take 25 mg by mouth at bedtime as needed for itching.   Yes [provider]  diphenoxylate-atropine (LOMOTIL) 2.5-0.025 MG tablet TAKE ONE TABLET BY MOUTH FOUR TIMES A DAY AS NEEDED FOR DIARRHEA OR LOOSE STOOLS Patient taking differently: Take 1 tablet by mouth 4 (four) times daily as needed for diarrhea or loose stools. 10/31/21  Yes Ennever, Rudell Cobb, MD  feeding supplement (ENSURE ENLIVE / ENSURE PLUS) LIQD Take 237 mLs by mouth 2 (two) times daily between meals. Patient taking differently: Take 237 mLs by mouth daily. 08/16/20  Yes Regalado, Belkys A, MD  gabapentin (NEURONTIN) 300 MG capsule Take 1 capsule (300 mg total) by mouth 4 (four) times daily. 10/31/21  Yes Volanda Napoleon, MD  glipiZIDE (GLUCOTROL) 5 MG tablet Take 5 mg by mouth 2 (two) times daily before a meal. 03/09/21  Yes [provider]  insulin  glargine (LANTUS) 100 unit/mL SOPN Inject 10 Units into the skin daily as needed (for blood sugar over 200).   Yes [provider]  lactulose (CHRONULAC) 10 GM/15ML solution Take 15 mLs (10 g total) by mouth 2 (two) times daily as needed for mild constipation. 01/24/22  Yes Celso Amy, NP  lidocaine-prilocaine (EMLA) cream Apply to affected area once Patient taking differently: 1 application. daily as needed (port access). 02/01/21  Yes Ennever, Rudell Cobb, MD  LORazepam (ATIVAN) 0.5 MG tablet Take 1 tablet (0.5 mg total) by mouth every 6 (six) hours as needed (Nausea or vomiting). 02/01/21  Yes Volanda Napoleon, MD  megestrol (MEGACE) 400 MG/10ML suspension Take 10 mLs (400 mg total) by mouth 2 (two) times daily. Patient taking differently: Take 400 mg by mouth 2 (two) times daily as needed (appetite). 02/21/21  Yes Ennever, Rudell Cobb, MD  melatonin 5 MG TABS Take 5 mg by mouth at bedtime.   Yes [provider]  metFORMIN (GLUCOPHAGE) 1000 MG tablet Take 1,000 mg by mouth 2 (two) times daily with a meal. 05/18/21  Yes [provider]  metoprolol tartrate (LOPRESSOR) 50 MG tablet TAKE ONE TABLET BY  MOUTH TWICE A DAY Patient taking differently: Take 50 mg by mouth 2 (two) times daily. 11/16/20  Yes Nahser, Wonda Cheng, MD  ondansetron (ZOFRAN) 8 MG tablet Take 1 tablet (8 mg total) by mouth 2 (two) times daily as needed for refractory nausea / vomiting. Start on day 3 after chemo. 02/01/21  Yes Volanda Napoleon, MD  pantoprazole (PROTONIX) 40 MG tablet Take 1 tablet (40 mg total) by mouth 2 (two) times daily. Follow up with gastroenterology for additional instructions 12/30/21 02/28/22 Yes Elodia Florence., MD  prochlorperazine (COMPAZINE) 10 MG tablet Take 1 tablet (10 mg total) by mouth every 6 (six) hours as needed (Nausea or vomiting). Patient taking differently: Take 10 mg by mouth every 6 (six) hours as needed for nausea or vomiting (Nausea or vomiting). 02/01/21  Yes Volanda Napoleon, MD  tamsulosin (FLOMAX) 0.4 MG CAPS capsule Take 0.4 mg by mouth at bedtime. 08/07/21  Yes [provider]  dexamethasone (DECADRON) 4 MG tablet Take 2 tablets (8 mg total) by mouth daily. Start the day after chemotherapy for 2 days. 02/01/21   Volanda Napoleon, MD  sucralfate (CARAFATE) 1 g tablet Take 1 tablet (1 g total) by mouth 4 (four) times daily -  with meals and at bedtime for 14 days. 12/30/21 01/13/22  Elodia Florence., MD    Current Facility-Administered Medications  Medication Dose Route Frequency Provider Last Rate Last Admin   acetaminophen (TYLENOL) tablet 650 mg  650 mg Oral Q6H PRN Marcelyn Bruins, MD       Or   acetaminophen (TYLENOL) suppository 650 mg  650 mg Rectal Q6H PRN Marcelyn Bruins, MD       atorvastatin (LIPITOR) tablet 80 mg  80 mg Oral Daily Marcelyn Bruins, MD       diphenhydrAMINE (BENADRYL) capsule 25 mg  25 mg Oral QHS PRN Marcelyn Bruins, MD       feeding supplement (ENSURE ENLIVE / ENSURE PLUS) liquid 237 mL  237 mL Oral BID BM Marcelyn Bruins, MD       gabapentin (NEURONTIN) capsule 300 mg  300 mg Oral TID Marcelyn Bruins, MD   300 mg at 02/08/22 2214   insulin aspart (novoLOG) injection 0-9 Units  0-9 Units Subcutaneous TID WC Marcelyn Bruins, MD       megestrol (MEGACE) 400 MG/10ML suspension 400 mg  400 mg Oral BID Marcelyn Bruins, MD       melatonin tablet 5 mg  5 mg Oral QHS PRN Marcelyn Bruins, MD   5 mg at 02/08/22 2300   metoprolol tartrate (LOPRESSOR) tablet 50 mg  50 mg Oral BID Marcelyn Bruins, MD       pantoprazole (PROTONIX) EC tablet 40 mg  40 mg Oral BID Marcelyn Bruins, MD       sodium chloride flush (NS) 0.9 % injection 3 mL  3 mL Intravenous Q12H Marcelyn Bruins, MD   3 mL at 02/08/22 2106   tamsulosin (FLOMAX) capsule 0.4 mg  0.4 mg Oral QHS Marcelyn Bruins, MD   0.4 mg at 02/08/22 2214   Facility-Administered Medications Ordered in Other Encounters  Medication  Dose Route Frequency Provider Last Rate Last Admin   sodium chloride flush (NS) 0.9 % injection 10 mL  10 mL Intravenous PRN Volanda Napoleon, MD        Allergies as of 02/08/2022   (Not on File)  Family History  Problem Relation Age of Onset   Atrial fibrillation Mother    Congestive Heart Failure Mother    Cancer Neg Hx     Social History   Socioeconomic History   Marital status: Married    Spouse name: Not on file   Number of children: Not on file   Years of education: Not on file   Highest education level: Not on file  Occupational History   Occupation: Advertising account executive  Tobacco Use   Smoking status: Former    Packs/day: 1.00    Years: 30.00    Pack years: 30.00    Types: Cigarettes    Quit date: 2006    Years since quitting: 17.4   Smokeless tobacco: Never   Tobacco comments:    quit 2008  Vaping Use   Vaping Use: Never used  Substance and Sexual Activity   Alcohol use: Never   Drug use: Never   Sexual activity: Not on file  Other Topics Concern   Not on file  Social History Narrative   Not on file   Social Determinants of Health   Financial Resource Strain: Not on file  Food Insecurity: Not on file  Transportation Needs: Not on file  Physical Activity: Not on file  Stress: Not on file  Social Connections: Not on file  Intimate Partner Violence: Not on file    Review of Systems: Pertinent positive and negative review of systems were noted in the above HPI section.  All other review of systems was otherwise negative.  Physical Exam: Vital signs in last 24 hours: Temp:  [97.6 F (36.4 C)-98.9 F (37.2 C)] 98.4 F (36.9 C) (06/02 0500) Pulse Rate:  [70-90] 90 (06/02 0500) Resp:  [16-25] 17 (06/02 0500) BP: (85-115)/(51-76) 91/62 (06/02 0500) SpO2:  [96 %-100 %] 96 % (06/02 0500)   General:   Alert,  Well-developed, older [name] male, wife at bedside pleasant and cooperative in NAD Head:  Normocephalic and atraumatic. Eyes:  Sclera clear,  no icterus.   Conjunctiva pink. Ears:  Normal auditory acuity. Nose:  No deformity, discharge,  or lesions. Mouth:  No deformity or lesions.   Neck:  Supple; no masses or thyromegaly. Lungs:  Clear throughout to auscultation.   No wheezes, crackles, or rhonchi.  Heart:  Regular rate and rhythm; no murmurs, clicks, rubs,  or gallops.  Sternal incisional scar Abdomen:  Soft, mild tenderness in the epigastrium, no definite palpable mass, no definite fluid wave but abdomen feels full. Rectal: Not done, Melena documented per Dr. Marin Olp Msk:  Symmetrical without gross deformities. . Pulses:  Normal pulses noted. Extremities:  Without clubbing or edema. Neurologic:  Alert and  oriented x4;  grossly normal neurologically. Skin:  Intact without significant lesions or rashes.. Psych:  Alert and cooperative. Normal mood and affect.  Intake/Output from previous day: No intake/output data recorded. Intake/Output this shift: No intake/output data recorded.  Lab Results: Recent Labs    02/07/22 1012 02/08/22 1553 02/09/22 0524  WBC 2.9* 2.4* 2.5*  HGB 8.7* 11.1* 10.6*  HCT 25.6* 33.0* 31.0*  PLT 127* 112* 88*   BMET Recent Labs    02/07/22 1012 02/08/22 1553 02/09/22 0524  NA 141 141 140  K 3.1* 3.3* 3.1*  CL 110 112* 113*  CO2 20* 18* 21*  GLUCOSE 182* 144* 103*  BUN '13 14 13  '$ CREATININE 0.96 1.21 1.05  CALCIUM 8.6* 8.6* 8.2*   LFT Recent Labs    02/09/22 0524  PROT 5.1*  ALBUMIN 2.6*  AST 38  ALT 36  ALKPHOS 204*  BILITOT 2.2*   PT/INR No results for input(s): LABPROT, INR in the last 72 hours. Hepatitis Panel No results for input(s): HEPBSAG, HCVAB, HEPAIGM, HEPBIGM in the last 72 hours.    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  Initially diagnosed December 2021.  Patient has been undergoing chemotherapy. Presents now with melena over the past 3 to 4 days. He had similar presentation in April  2023 and underwent EGD at that time with evidence of a nodular gastric mass with central depression but no definite ulceration.  Unfortunately biopsy specimen does not appear to have been sent to pathology. Recent CT imaging April 2023 also showed a gastric mass measuring 6.7 x 1.9 cm along the greater curve, multiple liver mets and a small amount of ascites  He is certainly bleeding from development of ulceration of the known gastric malignancy.  #2 abdominal fullness and swelling-suspect increasing abdominal ascites #3 anemia secondary to above-stable post outpatient transfusion earlier this week #4 coronary artery disease status post CABG #5  Diabetes mellitus #6.  History of just of heart failure #7.  Chronic kidney disease  Plan; liquid diet today, n.p.o. after midnight We are unable to schedule for EGD today, this will be scheduled for Dr. Lyndel Safe for tomorrow a.m. 02/10/2022.  Procedure was discussed in detail with the patient and wife at bedside via the interpreter, and they agree to proceed. Continue to trend hemoglobin, transfuse as indicated Consider abdominal ultrasound to assess for increased ascites  Will likely need radiation to help with bleeding from known gastric adenocarcinoma, as there is no endoscopic therapy that can be used in this setting.     Amy Esterwood PA-C 02/09/2022, 8:49 AM     Attending physician's note   I have taken history, reviewed the chart and examined the patient. I performed a substantive portion of this encounter, including complete performance of at least one of the key components, in conjunction with the APP. I agree with the Advanced Practitioner's note, impression and recommendations.    Metastatic gastric AdenoCa with liver mets (dx Dec 2021) on chemo, with recurrent melena req frequent transfusions (s/p 2U yesterday). CT AP 12/2021 shows persistent gastric mass. No new liver mets. Has likely new ascites. Prev EGD 12/2021 showed nodular mucosa in  the gastric body, along the lesser curvature of the stomach and incisura. Biopsied (send rush- never made it to path), duodenal polyp (Bx- peptic duodenitis)  Pancytopenia likely d/t ?Enhertu  H/O ventricular thrombus.  Off Xarelto since April 2023.  2DE done today.   CHF with EF 40-45%. CAD s/p CABG  Comorbidities also include CKD, DM 2   Plan: -IV Protonix -EGD tomorrow with Dr. Bryan Lemma.  Scheduled at 730. Pt does understand limited Endo options. -Trend CBC -[place] next week. If + ascites, tap. Send fluid for TP, alb, cytology and cell count. -May need to involve IR for embolization (if feasible) or radiation oncology for palliative XRT depending upon above EGD findings.   Carmell Austria, MD [name] [phone]

## 2022-02-10 ENCOUNTER — Encounter (HOSPITAL_COMMUNITY)
Admission: EM | Disposition: A | Discharge status: Home / Self Care | Payer: Self-pay | Source: Ambulatory Visit | Attending: Internal Medicine

## 2022-02-10 ENCOUNTER — Inpatient Hospital Stay (HOSPITAL_COMMUNITY): Payer: Medicare Other

## 2022-02-10 ENCOUNTER — Encounter (HOSPITAL_COMMUNITY): Payer: Self-pay | Admitting: Internal Medicine

## 2022-02-10 DIAGNOSIS — D62 Acute posthemorrhagic anemia: Secondary | ICD-10-CM

## 2022-02-10 DIAGNOSIS — K3189 Other diseases of stomach and duodenum: Secondary | ICD-10-CM

## 2022-02-10 DIAGNOSIS — I2510 Atherosclerotic heart disease of native coronary artery without angina pectoris: Secondary | ICD-10-CM

## 2022-02-10 DIAGNOSIS — K2970 Gastritis, unspecified, without bleeding: Secondary | ICD-10-CM

## 2022-02-10 HISTORY — PX: ESOPHAGOGASTRODUODENOSCOPY (EGD) WITH PROPOFOL: SHX5813

## 2022-02-10 HISTORY — PX: BIOPSY: SHX5522

## 2022-02-10 LAB — CBC WITH DIFFERENTIAL/PLATELET
Abs Immature Granulocytes: 0.01 10*3/uL (ref 0.00–0.07)
Basophils Absolute: 0 10*3/uL (ref 0.0–0.1)
Basophils Relative: 1 %
Eosinophils Absolute: 0.1 10*3/uL (ref 0.0–0.5)
Eosinophils Relative: 5 %
HCT: 29.2 % — ABNORMAL LOW (ref 39.0–52.0)
Hemoglobin: 10 g/dL — ABNORMAL LOW (ref 13.0–17.0)
Immature Granulocytes: 1 %
Lymphocytes Relative: 22 %
Lymphs Abs: 0.5 10*3/uL — ABNORMAL LOW (ref 0.7–4.0)
MCH: 33.8 pg (ref 26.0–34.0)
MCHC: 34.2 g/dL (ref 30.0–36.0)
MCV: 98.6 fL (ref 80.0–100.0)
Monocytes Absolute: 0.4 10*3/uL (ref 0.1–1.0)
Monocytes Relative: 18 %
Neutro Abs: 1.2 10*3/uL — ABNORMAL LOW (ref 1.7–7.7)
Neutrophils Relative %: 53 %
Platelets: 94 10*3/uL — ABNORMAL LOW (ref 150–400)
RBC: 2.96 MIL/uL — ABNORMAL LOW (ref 4.22–5.81)
RDW: 20.7 % — ABNORMAL HIGH (ref 11.5–15.5)
WBC: 2.2 10*3/uL — ABNORMAL LOW (ref 4.0–10.5)
nRBC: 0 % (ref 0.0–0.2)

## 2022-02-10 LAB — COMPREHENSIVE METABOLIC PANEL
ALT: 34 U/L (ref 0–44)
AST: 37 U/L (ref 15–41)
Albumin: 2.6 g/dL — ABNORMAL LOW (ref 3.5–5.0)
Alkaline Phosphatase: 200 U/L — ABNORMAL HIGH (ref 38–126)
Anion gap: 6 (ref 5–15)
BUN: 15 mg/dL (ref 8–23)
CO2: 20 mmol/L — ABNORMAL LOW (ref 22–32)
Calcium: 8.1 mg/dL — ABNORMAL LOW (ref 8.9–10.3)
Chloride: 114 mmol/L — ABNORMAL HIGH (ref 98–111)
Creatinine, Ser: 0.87 mg/dL (ref 0.61–1.24)
GFR, Estimated: >60 mL/min (ref >60)
Glucose, Bld: 170 mg/dL — ABNORMAL HIGH (ref 70–99)
Potassium: 3 mmol/L — ABNORMAL LOW (ref 3.5–5.1)
Sodium: 140 mmol/L (ref 135–145)
Total Bilirubin: 1.7 mg/dL — ABNORMAL HIGH (ref 0.3–1.2)
Total Protein: 4.7 g/dL — ABNORMAL LOW (ref 6.5–8.1)

## 2022-02-10 LAB — ECHOCARDIOGRAM COMPLETE
AR max vel: 3.83 cm2
AV Area VTI: 3.86 cm2
AV Area mean vel: 3.25 cm2
AV Mean grad: 4 mmHg
AV Peak grad: 6.1 mmHg
Ao pk vel: 1.23 m/s
Area-P 1/2: 9.85 cm2
Calc EF: 39.1 %
MV M vel: 5.16 m/s
MV Peak grad: 106.5 mmHg
Radius: 0.3 cm
S' Lateral: 3.4 cm
Single Plane A2C EF: 43.9 %
Single Plane A4C EF: 30.2 %

## 2022-02-10 LAB — GLUCOSE, CAPILLARY
Glucose-Capillary: 113 mg/dL — ABNORMAL HIGH (ref 70–99)
Glucose-Capillary: 185 mg/dL — ABNORMAL HIGH (ref 70–99)
Glucose-Capillary: 237 mg/dL — ABNORMAL HIGH (ref 70–99)
Glucose-Capillary: 187 mg/dL — ABNORMAL HIGH (ref 70–99)

## 2022-02-10 LAB — MAGNESIUM: Magnesium: 1.4 mg/dL — ABNORMAL LOW (ref 1.7–2.4)

## 2022-02-10 SURGERY — ESOPHAGOGASTRODUODENOSCOPY (EGD) WITH PROPOFOL
Anesthesia: Monitor Anesthesia Care

## 2022-02-10 MED ORDER — LIDOCAINE-PRILOCAINE 2.5-2.5 % EX CREA
TOPICAL_CREAM | CUTANEOUS | Status: DC | PRN
Start: 2022-02-10 — End: 2022-02-14
  Filled 2022-02-10: qty 5

## 2022-02-10 MED ORDER — PROPOFOL 500 MG/50ML IV EMUL
INTRAVENOUS | Status: AC
  Filled 2022-02-10: qty 50

## 2022-02-10 MED ORDER — PROPOFOL 10 MG/ML IV BOLUS
INTRAVENOUS | Status: AC
  Filled 2022-02-10: qty 20

## 2022-02-10 MED ORDER — LACTATED RINGERS IV SOLN
INTRAVENOUS | Status: AC | PRN
  Administered 2022-02-10: 20 mL/h via INTRAVENOUS

## 2022-02-10 MED ORDER — MAGNESIUM SULFATE 2 GM/50ML IV SOLN
2.0000 g | Freq: Once | INTRAVENOUS | Status: AC
  Administered 2022-02-10: 2 g via INTRAVENOUS
  Filled 2022-02-10: qty 50

## 2022-02-10 MED ORDER — ONDANSETRON HCL 4 MG/2ML IJ SOLN: 4.0000 mg | Freq: Once | INTRAMUSCULAR | Status: DC | PRN

## 2022-02-10 MED ORDER — AMISULPRIDE (ANTIEMETIC) 5 MG/2ML IV SOLN
10.0000 mg | Freq: Once | INTRAVENOUS | Status: DC | PRN
Start: 2022-02-10 — End: 2022-02-10

## 2022-02-10 MED ORDER — PROPOFOL 10 MG/ML IV BOLUS
INTRAVENOUS | Status: DC | PRN
  Administered 2022-02-10: 20 mg via INTRAVENOUS

## 2022-02-10 MED ORDER — PHENYLEPHRINE 80 MCG/ML (10ML) SYRINGE FOR IV PUSH (FOR BLOOD PRESSURE SUPPORT)
PREFILLED_SYRINGE | INTRAVENOUS | Status: DC | PRN
  Administered 2022-02-10 (×2): 80 ug via INTRAVENOUS

## 2022-02-10 MED ORDER — POTASSIUM CHLORIDE 10 MEQ/50ML IV SOLN
10.0000 meq | INTRAVENOUS | Status: AC
  Administered 2022-02-10 (×4): 10 meq via INTRAVENOUS
  Filled 2022-02-10 (×4): qty 50

## 2022-02-10 MED ORDER — PROPOFOL 500 MG/50ML IV EMUL
INTRAVENOUS | Status: DC | PRN
  Administered 2022-02-10: 125 ug/kg/min via INTRAVENOUS

## 2022-02-10 MED ORDER — POTASSIUM CHLORIDE CRYS ER 20 MEQ PO TBCR
40.0000 meq | EXTENDED_RELEASE_TABLET | ORAL | Status: AC
  Administered 2022-02-10 (×2): 40 meq via ORAL
  Filled 2022-02-10 (×2): qty 2

## 2022-02-10 SURGICAL SUPPLY — 15 items

## 2022-02-10 NOTE — Transfer of Care (Signed)
Immediate Anesthesia Transfer of Care Note  Patient: Jimmy Blankenship  Procedure(s) Performed: ESOPHAGOGASTRODUODENOSCOPY (EGD) WITH PROPOFOL BIOPSY  Patient Location: PACU  Anesthesia Type:MAC  Level of Consciousness: awake and patient cooperative  Airway & Oxygen Therapy: Patient Spontanous Breathing and Patient connected to nasal cannula oxygen  Post-op Assessment: Report given to RN and Post -op Vital signs reviewed and stable  Post vital signs: Reviewed and stable  Last Vitals:  Vitals Value Taken Time  BP 92/68 02/10/22 0821  Temp    Pulse 92 02/10/22 0822  Resp 21 02/10/22 0822  SpO2 98 % 02/10/22 0822  Vitals shown include unvalidated device data.  Last Pain:  Vitals:   02/10/22 0719  TempSrc: Temporal  PainSc: 0-No pain         Complications: No notable events documented.

## 2022-02-10 NOTE — Anesthesia Procedure Notes (Signed)
Procedure Name: MAC Date/Time: 02/10/2022 7:47 AM Performed by: Lollie Sails, CRNA Pre-anesthesia Checklist: Patient identified, Emergency Drugs available, Suction available, Patient being monitored and Timeout performed Oxygen Delivery Method: Nasal cannula Placement Confirmation: positive ETCO2

## 2022-02-10 NOTE — Progress Notes (Signed)
PROGRESS NOTE    Jimmy Blankenship  XTK:[id number] DOB: 1951/04/21 DOA: 02/08/2022 PCP: Jimmy Holstein, MD    Brief Narrative:  Patient is a 71 year old Jimmy Blankenship male with past medical history significant for gastric cancer mets, GI bleeding/anemia, CAD status post CABG, CHF, diabetes, hyperlipidemia, hypertension, BPH, CKD, LV thrombus presenting with GI bleeding and symptomatic anemia.  Patient has undergone EGD (see documentation).  GI input is appreciated.  Continue to monitor H/H.  Possible discharge back on tomorrow if patient remains stable.   Assessment and Plan: GI bleed/Symptomatic anemia > Patient presenting with GI bleeding and symptomatic anemia in the setting of gastric cancer as below. > Received 2 units outpatient ordered by oncology and is being admitted for endoscopic evaluation for possible progression of stomach cancer > trend CBC 02/10/2022: Hemoglobin has remained stable.   Gastric cancer > Known history of gastric cancer with liver mets and recurrent GI bleeding and symptomatic anemia. > Currently on Enhertu and iron supplementation > Follows with Dr. Marin Olp who saw patient yesterday and his sent patient in for endoscopic evaluation consider recurrent GI bleeding and need to evaluate for disease burden - GI consult: plan for EGD in AM -oncology: does have a obvious bleeding tumor in his stomach, that would get Radiation Oncology to see him and see about doing palliative radiation to his stomach.  Echo ordered  CAD > Status post CABG - Continue home atorvastatin, metoprolol  CHF > Last echo in 2020 with EF 40-45%, G1 DD, moderately reduced RV function > Not currently on any diuretics - Continue home metoprolol  Diabetes - SSI  Hyperlipidemia - Continue home atorvastatin  Hypertension > Low normal blood pressure in the ED - Continue home metoprolol  BPH - Continue home tamsulosin  CKD > Creatinine near baseline at 1.2      DVT prophylaxis: SCDs  Start: 02/08/22 1847    Code Status: DNR   Disposition Plan:  Level of care: Telemetry Status is: Observation The patient will require care spanning > 2 midnights and should be moved to inpatient because: needs EGD    Consultants:  GI oncology   Subjective: Seen alongside patient's wife. No new complaints.  Objective: Vitals:   02/10/22 0830 02/10/22 0838 02/10/22 1344 02/10/22 1348  BP: 106/66 101/66 (!) 84/58 91/65  Pulse: 86 90 85   Resp: (!) '25 17 18   '$ Temp:   98.1 F (36.7 C)   TempSrc:   Oral   SpO2: 100% 100% 96%   Weight:        Intake/Output Summary (Last 24 hours) at 02/10/2022 1356 Last data filed at 02/10/2022 0901 Gross per 24 hour  Intake 322.28 ml  Output --  Net 322.28 ml   Filed Weights   02/10/22 0520  Weight: 69.1 kg    Examination: General condition: Patient is not in any distress.  Patient is awake and alert. HEENT: Patient is pale. Neck: Supple. Lungs: Clear to auscultation. CVS: S1-S2. Abdomen: Gaseous distention. Neuro: Awake and alert. Extremities no edema.   Data Reviewed: I have personally reviewed following labs and imaging studies  CBC: Recent Labs  Lab 02/07/22 1012 02/08/22 1553 02/09/22 0524 02/10/22 0111  WBC 2.9* 2.4* 2.5* 2.2*  NEUTROABS 1.9  --   --  1.2*  HGB 8.7* 11.1* 10.6* 10.0*  HCT 25.6* 33.0* 31.0* 29.2*  MCV 101.2* 98.2 97.5 98.6  PLT 127* 112* 88* 94*    Basic Metabolic Panel: Recent Labs  Lab 02/07/22 1012 02/08/22  1553 02/09/22 0524 02/10/22 0111  NA 141 141 140 140  K 3.1* 3.3* 3.1* 3.0*  CL 110 112* 113* 114*  CO2 20* 18* 21* 20*  GLUCOSE 182* 144* 103* 170*  BUN '13 14 13 15  '$ CREATININE 0.96 1.21 1.05 0.87  CALCIUM 8.6* 8.6* 8.2* 8.1*  MG  --  1.4*  --   --     GFR: Estimated Creatinine Clearance: 78.3 mL/min (by C-G formula based on SCr of 0.87 mg/dL). Liver Function Tests: Recent Labs  Lab 02/07/22 1012 02/08/22 1553 02/09/22 0524 02/10/22 0111  AST 37 43* 38 37  ALT 36  41 36 34  ALKPHOS 225* 214* 204* 200*  BILITOT 1.5* 2.9* 2.2* 1.7*  PROT 5.4* 5.3* 5.1* 4.7*  ALBUMIN 3.3* 2.9* 2.6* 2.6*    No results for input(s): LIPASE, AMYLASE in the last 168 hours. No results for input(s): AMMONIA in the last 168 hours. Coagulation Profile: No results for input(s): INR, PROTIME in the last 168 hours. Cardiac Enzymes: No results for input(s): CKTOTAL, CKMB, CKMBINDEX, TROPONINI in the last 168 hours. BNP (last 3 results) No results for input(s): PROBNP in the last 8760 hours. HbA1C: No results for input(s): HGBA1C in the last 72 hours. CBG: Recent Labs  Lab 02/09/22 1216 02/09/22 1633 02/09/22 2117 02/10/22 0737 02/10/22 1142  GLUCAP 114* 154* 173* 113* 185*    Lipid Profile: No results for input(s): CHOL, HDL, LDLCALC, TRIG, CHOLHDL, LDLDIRECT in the last 72 hours. Thyroid Function Tests: No results for input(s): TSH, T4TOTAL, FREET4, T3FREE, THYROIDAB in the last 72 hours. Anemia Panel: No results for input(s): VITAMINB12, FOLATE, FERRITIN, TIBC, IRON, RETICCTPCT in the last 72 hours. Sepsis Labs: No results for input(s): PROCALCITON, LATICACIDVEN in the last 168 hours.  No results found for this or any previous visit (from the past 240 hour(s)).       Radiology Studies: ECHOCARDIOGRAM COMPLETE  Result Date: 02/10/2022    ECHOCARDIOGRAM REPORT   Patient Name:   Jimmy Blankenship Date of Exam: 02/09/2022 Medical Rec #:  [id number]     Height:       69.0 in Accession #:    [id number]    Weight:       147.0 lb Date of Birth:  1951/05/29     BSA:          1.813 m Patient Age:    70 years      BP:           99/61 mmHg Patient Gender: M             HR:           105 bpm. Exam Location:  Inpatient Procedure: 2D Echo Indications:    Chemo. History of intracardiac thrombus.  History:        Patient has prior history of Echocardiogram examinations, most                 recent 08/28/2021. CAD, Cancer; Risk Factors:Dyslipidemia,                 Hypertension and  Diabetes.  Sonographer:    Joette Catching Referring Phys: [place]  1. No left ventricular thrombus is seen with Definity contrast. Left ventricular ejection fraction, by estimation, is 35 to 40%. The left ventricle has moderately decreased function. The left ventricle demonstrates regional wall motion abnormalities (see scoring diagram/findings for description). Left ventricular diastolic parameters are consistent with Grade I diastolic dysfunction (impaired  relaxation). There is dyskinesis of the entire left ventricular apex.  2. Right ventricular systolic function is normal. The right ventricular size is normal. There is normal pulmonary artery systolic pressure.  3. Left atrial size was severely dilated.  4. The mitral valve is normal in structure. Moderate mitral valve regurgitation.  5. The aortic valve is tricuspid. There is mild calcification of the aortic valve. There is mild thickening of the aortic valve. Aortic valve regurgitation is not visualized. Aortic valve sclerosis is present, with no evidence of aortic valve stenosis.  6. The inferior vena cava is dilated in size with >50% respiratory variability, suggesting right atrial pressure of 8 mmHg. Comparison(s): No significant change from prior study. Prior images reviewed side by side. FINDINGS  Left Ventricle: No left ventricular thrombus is seen with Definity contrast. Left ventricular ejection fraction, by estimation, is 35 to 40%. The left ventricle has moderately decreased function. The left ventricle demonstrates regional wall motion abnormalities. Definity contrast agent was given IV to delineate the left ventricular endocardial borders. The left ventricular internal cavity size was normal in size. There is no left ventricular hypertrophy. Left ventricular diastolic parameters are consistent with Grade I diastolic dysfunction (impaired relaxation). Normal left ventricular filling pressure.  LV Wall Scoring: The entire  apex is dyskinetic. Right Ventricle: The right ventricular size is normal. No increase in right ventricular wall thickness. Right ventricular systolic function is normal. There is normal pulmonary artery systolic pressure. The tricuspid regurgitant velocity is 2.19 m/s, and  with an assumed right atrial pressure of 3 mmHg, the estimated right ventricular systolic pressure is 16.0 mmHg. Left Atrium: Left atrial size was severely dilated. Right Atrium: Right atrial size was normal in size. Pericardium: There is no evidence of pericardial effusion. Mitral Valve: The mitral valve is normal in structure. Moderate mitral valve regurgitation, with eccentric posteriorly directed jet. Tricuspid Valve: The tricuspid valve is normal in structure. Tricuspid valve regurgitation is mild. Aortic Valve: The aortic valve is tricuspid. There is mild calcification of the aortic valve. There is mild thickening of the aortic valve. Aortic valve regurgitation is not visualized. Aortic valve sclerosis is present, with no evidence of aortic valve stenosis. Aortic valve mean gradient measures 4.0 mmHg. Aortic valve peak gradient measures 6.1 mmHg. Aortic valve area, by VTI measures 3.86 cm. Pulmonic Valve: The pulmonic valve was normal in structure. Pulmonic valve regurgitation is not visualized. Aorta: The aortic root is normal in size and structure. Venous: The inferior vena cava is dilated in size with greater than 50% respiratory variability, suggesting right atrial pressure of 8 mmHg. IAS/Shunts: No atrial level shunt detected by color flow Doppler. Additional Comments: A venous catheter is visualized in the right atrium. There is pleural effusion in the left lateral region.  LEFT VENTRICLE PLAX 2D LVIDd:         4.00 cm      Diastology LVIDs:         3.40 cm      LV e' lateral:   13.40 cm/s LV PW:         1.10 cm      LV E/e' lateral: 3.9 LV IVS:        1.10 cm LVOT diam:     2.20 cm LV SV:         81 LV SV Index:   45 LVOT Area:      3.80 cm  LV Volumes (MOD) LV vol d, MOD A2C: 114.0 ml LV vol d, MOD  A4C: 123.0 ml LV vol s, MOD A2C: 63.9 ml LV vol s, MOD A4C: 85.8 ml LV SV MOD A2C:     50.1 ml LV SV MOD A4C:     123.0 ml LV SV MOD BP:      46.7 ml RIGHT VENTRICLE            IVC RV Basal diam:  2.40 cm    IVC diam: 2.00 cm RV Mid diam:    1.90 cm RV S prime:     8.27 cm/s TAPSE (M-mode): 0.6 cm LEFT ATRIUM             Index        RIGHT ATRIUM           Index LA diam:        4.90 cm 2.70 cm/m   RA Area:     16.40 cm LA Vol (A2C):   48.8 ml 26.92 ml/m  RA Volume:   35.20 ml  19.42 ml/m LA Vol (A4C):   52.3 ml 28.85 ml/m LA Biplane Vol: 51.7 ml 28.52 ml/m  AORTIC VALVE                     PULMONIC VALVE AV Area (Vmax):    3.83 cm      PV Vmax:       0.99 m/s AV Area (Vmean):   3.25 cm      PV Peak grad:  3.9 mmHg AV Area (VTI):     3.86 cm AV Vmax:           123.00 cm/s AV Vmean:          104.000 cm/s AV VTI:            0.211 m AV Peak Grad:      6.1 mmHg AV Mean Grad:      4.0 mmHg LVOT Vmax:         124.00 cm/s LVOT Vmean:        88.900 cm/s LVOT VTI:          0.214 m LVOT/AV VTI ratio: 1.01  AORTA Ao Root diam: 3.50 cm MITRAL VALVE                  TRICUSPID VALVE MV Area (PHT): 9.85 cm       TR Peak grad:   19.2 mmHg MV Decel Time: 77 msec        TR Vmax:        219.00 cm/s MR Peak grad:    106.5 mmHg MR Mean grad:    65.0 mmHg    SHUNTS MR Vmax:         516.00 cm/s  Systemic VTI:  0.21 m MR Vmean:        387.0 cm/s   Systemic Diam: 2.20 cm MR PISA:         0.57 cm MR PISA Eff ROA: 4 mm MR PISA Radius:  0.30 cm MV E velocity: 52.00 cm/s MV A velocity: 114.00 cm/s MV E/A ratio:  0.46 Mihai Croitoru MD Electronically signed by Sanda Klein MD Signature Date/Time: 02/10/2022/10:06:18 AM    Final         Scheduled Meds:  atorvastatin  80 mg Oral Daily   feeding supplement  237 mL Oral BID BM   gabapentin  300 mg Oral TID   insulin aspart  0-9 Units Subcutaneous TID WC   megestrol  400 mg Oral BID  metoprolol tartrate  25  mg Oral BID   pantoprazole  40 mg Oral BID   potassium chloride  40 mEq Oral Q4H   sodium chloride flush  3 mL Intravenous Q12H   tamsulosin  0.4 mg Oral QHS   Continuous Infusions:  magnesium sulfate bolus IVPB     potassium chloride       LOS: 1 day    Bonnell Public, MD [facility] Available via Epic secure chat 7am-7pm After these hours, please refer to coverage provider listed on [email or web address] 02/10/2022, 1:56 PM

## 2022-02-10 NOTE — Op Note (Signed)
[facility] Patient Name: Jimmy Blankenship Procedure Date: 02/10/2022 MRN: [id number] Attending MD: Gerrit Heck , MD Date of Birth: 1951/04/18 CSN: [id number] Age: 71 Admit Type: Inpatient Procedure:                Upper GI endoscopy Indications:              Acute post hemorrhagic anemia, Melena                           71 year old male with known history of gastric                            adenocarcinoma with liver metastasis admitted with                            acute blood loss anemia and melena. Outpatient H/H                            was 8.7/25.6, with appropriate response to 1 unit                            PRBC transfusion. Currently stable at 10/29. Most                            recent EGD in 12/2021 with 4 x 5 cm nodular mass                            with central depression but without active                            bleeding. CT in 12/2021 with 6.7 x 1.9 cm gastric                            mass, multiple liver mets, and small amount of                            ascites. Providers:                Gerrit Heck, MD, Grace Isaac, RN, Benetta Spar, Technician Referring MD:              Medicines:                Monitored Anesthesia Care Complications:            No immediate complications. Estimated Blood Loss:     Estimated blood loss was minimal. Procedure:                Pre-Anesthesia Assessment:                           - Prior to the procedure, a History and Physical  was performed, and patient medications and                            allergies were reviewed. The patient's tolerance of                            previous anesthesia was also reviewed. The risks                            and benefits of the procedure and the sedation                            options and risks were discussed with the patient.                            All questions were answered, and informed  consent                            was obtained. Prior Anticoagulants: The patient has                            taken no previous anticoagulant or antiplatelet                            agents. ASA Grade Assessment: III - A patient with                            severe systemic disease. After reviewing the risks                            and benefits, the patient was deemed in                            satisfactory condition to undergo the procedure.                           After obtaining informed consent, the endoscope was                            passed under direct vision. Throughout the                            procedure, the patient's blood pressure, pulse, and                            oxygen saturations were monitored continuously. The                            [id number]-H190 ([id number] Olympus endoscope was introduced                            through the mouth, and advanced to the second part  of duodenum. The upper GI endoscopy was                            accomplished without difficulty. The patient                            tolerated the procedure well. Scope In: Scope Out: Findings:      The examined esophagus was normal.      Localized area of nodular mucosa with a central depression was found on       the lesser curvature of the stomach and proximal portion of the       incisura. This measured approximately 5 cm x 4 cm. No ulcers or active       bleeding. Given the elevated suspicion for recent malignant bleeding,       biopsies were carefully taken with a cold forceps for histology. The       underlying tissue was tough, which limited the depth of biopsies. The       biopsies sites were lavaged and observed for several minutes to ensure       cessation of bleeding from the biopsy sites. Estimated blood loss was       minimal.      Diffuse moderate inflammation characterized by congestion (edema) and       erythema was found in the  gastric fundus and in the gastric body. No       ulcers, erosions, or stigmata of recent bleeding. Biopsies were taken       with a cold forceps for histology. Estimated blood loss was minimal.      The examined duodenum was normal. Impression:               - Normal esophagus.                           - Nodular mucosa with central depression, but no                            ulceration, in the lesser curvature of the stomach.                            The underlying tissue was hard and limited depth of                            biopsies. This area was biopsied.                           - Gastritis. Biopsied.                           - Normal examined duodenum.                           - No active bleeding on this study and no lesions                            amenable to endoscopic intervention. Suspect the  anemia and melena is from intermittent malignant                            oozing. If concern for acute rebleeding, would                            consider either IR or Radiation Oncology                            consultation. Moderate Sedation:      Not Applicable - Patient had care per Anesthesia. Recommendation:           - Return patient to hospital ward for ongoing care.                           - Full liquid diet now, then advance as tolerated                            later today.                           - Continue present medications.                           - Await pathology results.                           - Plan for inpatient abdominal ultrasound and if                            ascites present, diagnostic paracentesis with fluid                            sent for cell count, Gram stain, culture, albumin,                            and cytology. Procedure Code(s):        --- Professional ---                           [phone], Esophagogastroduodenoscopy, flexible,                            transoral; with biopsy, single or  multiple Diagnosis Code(s):        --- Professional ---                           K31.89, Other diseases of stomach and duodenum                           K29.70, Gastritis, unspecified, without bleeding                           D62, Acute posthemorrhagic anemia                           K92.1,  Melena (includes Hematochezia) CPT copyright 2019 [employer]. All rights reserved. The codes documented in this report are preliminary and upon coder review may  be revised to meet current compliance requirements. Gerrit Heck, MD 02/10/2022 8:30:11 AM Number of Addenda: 0

## 2022-02-10 NOTE — Interval H&P Note (Signed)
History and Physical Interval Note:  No acute events overnight.  Repeat H/H 10/29 this morning.  No overt bleeding overnight.    02/10/2022 7:30 AM  Jimmy Blankenship  has presented today for surgery, with the diagnosis of Gastric cancer, melena.  The various methods of treatment have been discussed with the patient and family. After consideration of risks, benefits and other options for treatment, the patient has consented to  Procedure(s): ESOPHAGOGASTRODUODENOSCOPY (EGD) WITH PROPOFOL (N/A) as a surgical intervention.  The patient's history has been reviewed, patient examined, no change in status, stable for surgery.  I have reviewed the patient's chart and labs.  Questions were answered to the patient's satisfaction.     Dominic Pea Jimmy Blankenship

## 2022-02-10 NOTE — Anesthesia Postprocedure Evaluation (Signed)
Anesthesia Post Note  Patient: Zared Knoth  Procedure(s) Performed: ESOPHAGOGASTRODUODENOSCOPY (EGD) WITH PROPOFOL BIOPSY     Patient location during evaluation: PACU Anesthesia Type: MAC Level of consciousness: awake and alert Pain management: pain level controlled Vital Signs Assessment: post-procedure vital signs reviewed and stable Respiratory status: spontaneous breathing, nonlabored ventilation, respiratory function stable and patient connected to nasal cannula oxygen Cardiovascular status: stable and blood pressure returned to baseline Postop Assessment: no apparent nausea or vomiting Anesthetic complications: no   No notable events documented.  Last Vitals:  Vitals:   02/10/22 0830 02/10/22 0838  BP: 106/66 101/66  Pulse: 86 90  Resp: (!) 25 17  Temp:    SpO2: 100% 100%    Last Pain:  Vitals:   02/10/22 0900  TempSrc:   PainSc: 0-No pain                 Effie Berkshire

## 2022-02-11 ENCOUNTER — Inpatient Hospital Stay (HOSPITAL_COMMUNITY): Payer: Medicare Other

## 2022-02-11 DIAGNOSIS — K2990 Gastroduodenitis, unspecified, without bleeding: Secondary | ICD-10-CM

## 2022-02-11 DIAGNOSIS — K5900 Constipation, unspecified: Secondary | ICD-10-CM

## 2022-02-11 DIAGNOSIS — R140 Abdominal distension (gaseous): Secondary | ICD-10-CM

## 2022-02-11 DIAGNOSIS — K2970 Gastritis, unspecified, without bleeding: Secondary | ICD-10-CM

## 2022-02-11 LAB — COMPREHENSIVE METABOLIC PANEL
ALT: 33 U/L (ref 0–44)
AST: 35 U/L (ref 15–41)
Albumin: 2.4 g/dL — ABNORMAL LOW (ref 3.5–5.0)
Alkaline Phosphatase: 191 U/L — ABNORMAL HIGH (ref 38–126)
Anion gap: 4 — ABNORMAL LOW (ref 5–15)
BUN: 12 mg/dL (ref 8–23)
CO2: 21 mmol/L — ABNORMAL LOW (ref 22–32)
Calcium: 8.3 mg/dL — ABNORMAL LOW (ref 8.9–10.3)
Chloride: 116 mmol/L — ABNORMAL HIGH (ref 98–111)
Creatinine, Ser: 0.94 mg/dL (ref 0.61–1.24)
GFR, Estimated: >60 mL/min (ref >60)
Glucose, Bld: 182 mg/dL — ABNORMAL HIGH (ref 70–99)
Potassium: 3.6 mmol/L (ref 3.5–5.1)
Sodium: 141 mmol/L (ref 135–145)
Total Bilirubin: 1.2 mg/dL (ref 0.3–1.2)
Total Protein: 4.7 g/dL — ABNORMAL LOW (ref 6.5–8.1)

## 2022-02-11 LAB — CBC WITH DIFFERENTIAL/PLATELET
Abs Immature Granulocytes: 0.01 10*3/uL (ref 0.00–0.07)
Basophils Absolute: 0 10*3/uL (ref 0.0–0.1)
Basophils Relative: 1 %
Eosinophils Absolute: 0.1 10*3/uL (ref 0.0–0.5)
Eosinophils Relative: 3 %
HCT: 27 % — ABNORMAL LOW (ref 39.0–52.0)
Hemoglobin: 9.3 g/dL — ABNORMAL LOW (ref 13.0–17.0)
Immature Granulocytes: 1 %
Lymphocytes Relative: 21 %
Lymphs Abs: 0.4 10*3/uL — ABNORMAL LOW (ref 0.7–4.0)
MCH: 33.9 pg (ref 26.0–34.0)
MCHC: 34.4 g/dL (ref 30.0–36.0)
MCV: 98.5 fL (ref 80.0–100.0)
Monocytes Absolute: 0.4 10*3/uL (ref 0.1–1.0)
Monocytes Relative: 18 %
Neutro Abs: 1.2 10*3/uL — ABNORMAL LOW (ref 1.7–7.7)
Neutrophils Relative %: 56 %
Platelets: 99 10*3/uL — ABNORMAL LOW (ref 150–400)
RBC: 2.74 MIL/uL — ABNORMAL LOW (ref 4.22–5.81)
RDW: 20.7 % — ABNORMAL HIGH (ref 11.5–15.5)
WBC: 2.1 10*3/uL — ABNORMAL LOW (ref 4.0–10.5)
nRBC: 0 % (ref 0.0–0.2)

## 2022-02-11 LAB — GLUCOSE, CAPILLARY
Glucose-Capillary: 162 mg/dL — ABNORMAL HIGH (ref 70–99)
Glucose-Capillary: 249 mg/dL — ABNORMAL HIGH (ref 70–99)
Glucose-Capillary: 202 mg/dL — ABNORMAL HIGH (ref 70–99)
Glucose-Capillary: 174 mg/dL — ABNORMAL HIGH (ref 70–99)

## 2022-02-11 LAB — MAGNESIUM: Magnesium: 1.8 mg/dL (ref 1.7–2.4)

## 2022-02-11 LAB — PHOSPHORUS: Phosphorus: 1.7 mg/dL — ABNORMAL LOW (ref 2.5–4.6)

## 2022-02-11 IMAGING — DX DG ABDOMEN 1V
2 series · 2 of 2 positions shown · non-contrast
Comparison: [DATE]

CLINICAL DATA: Abdominal distension

EXAM:
ABDOMEN - 1 VIEW

[abdomen kub (1 of 2)]
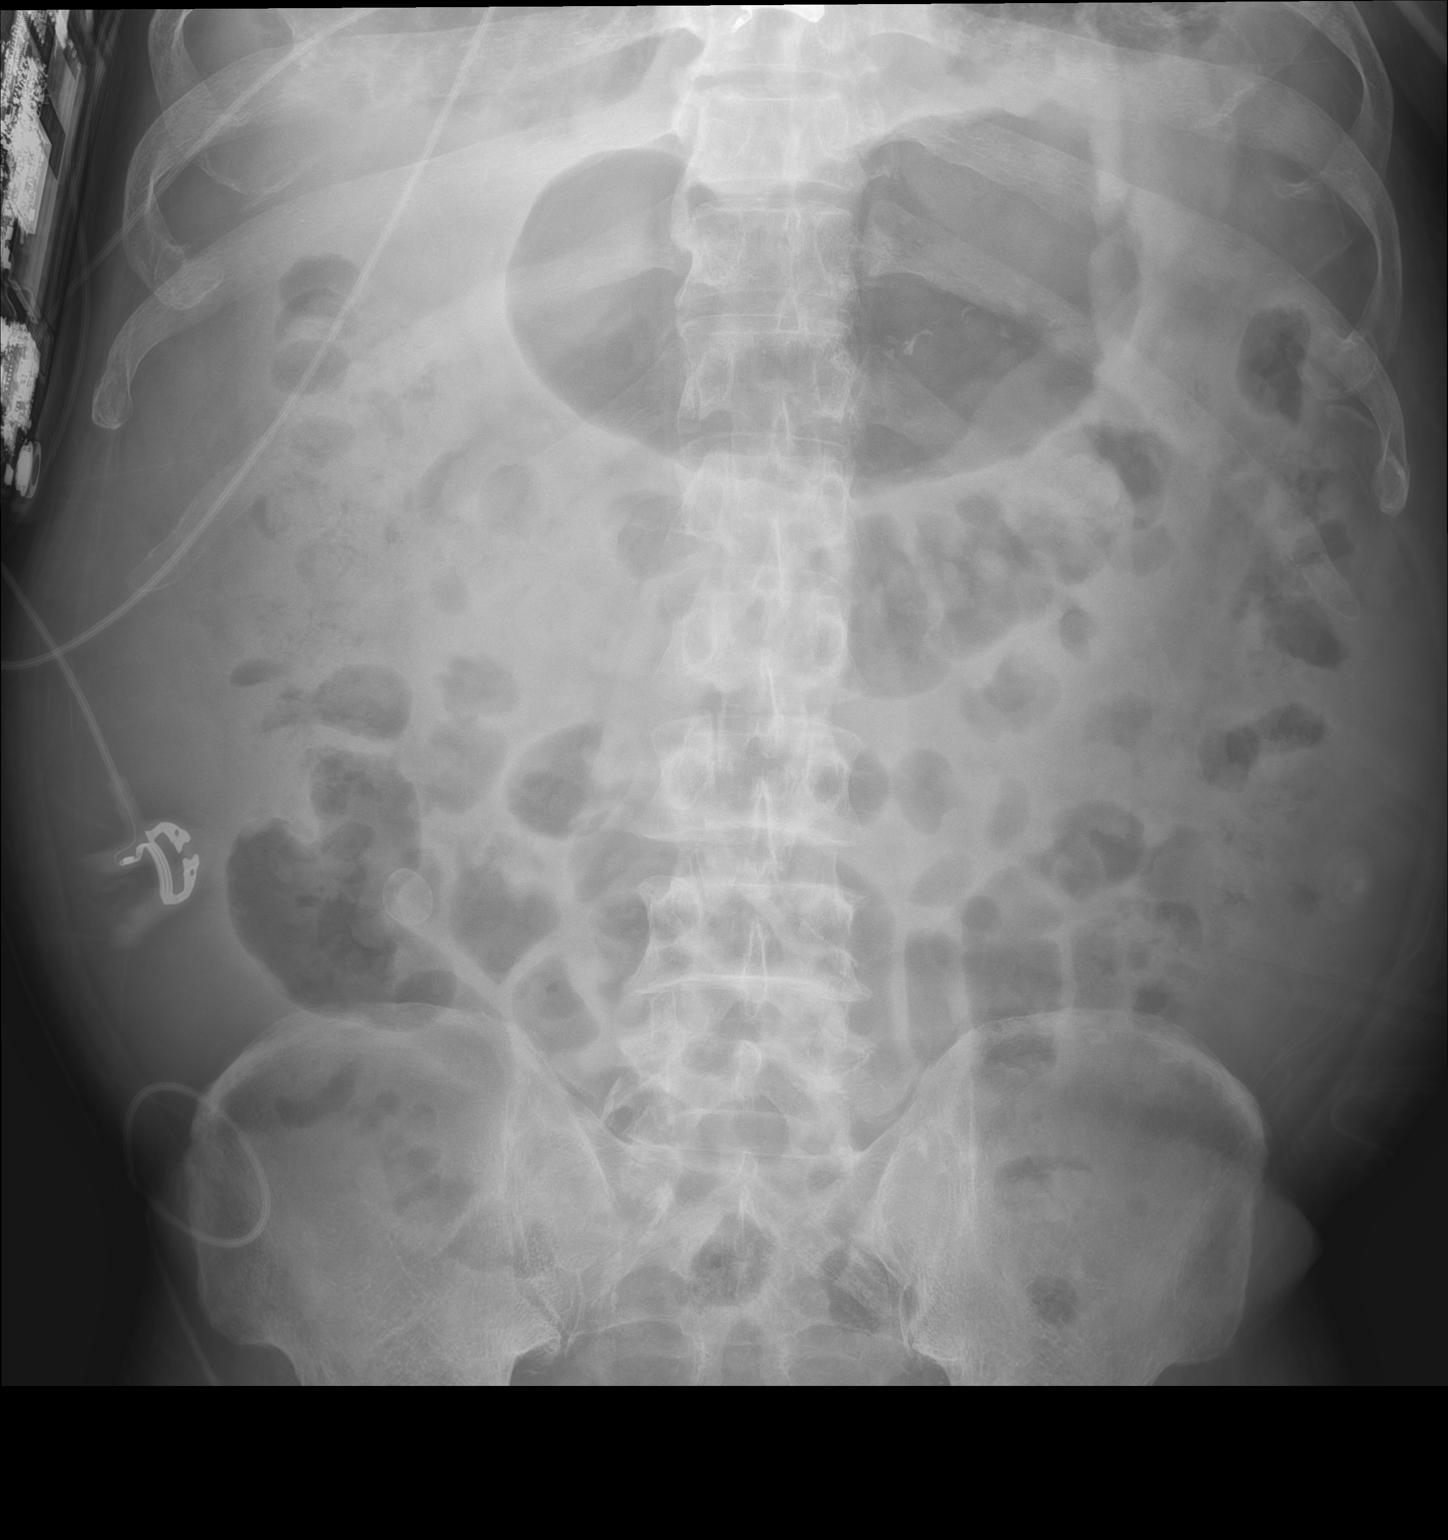

[abdomen kub (2 of 2)]
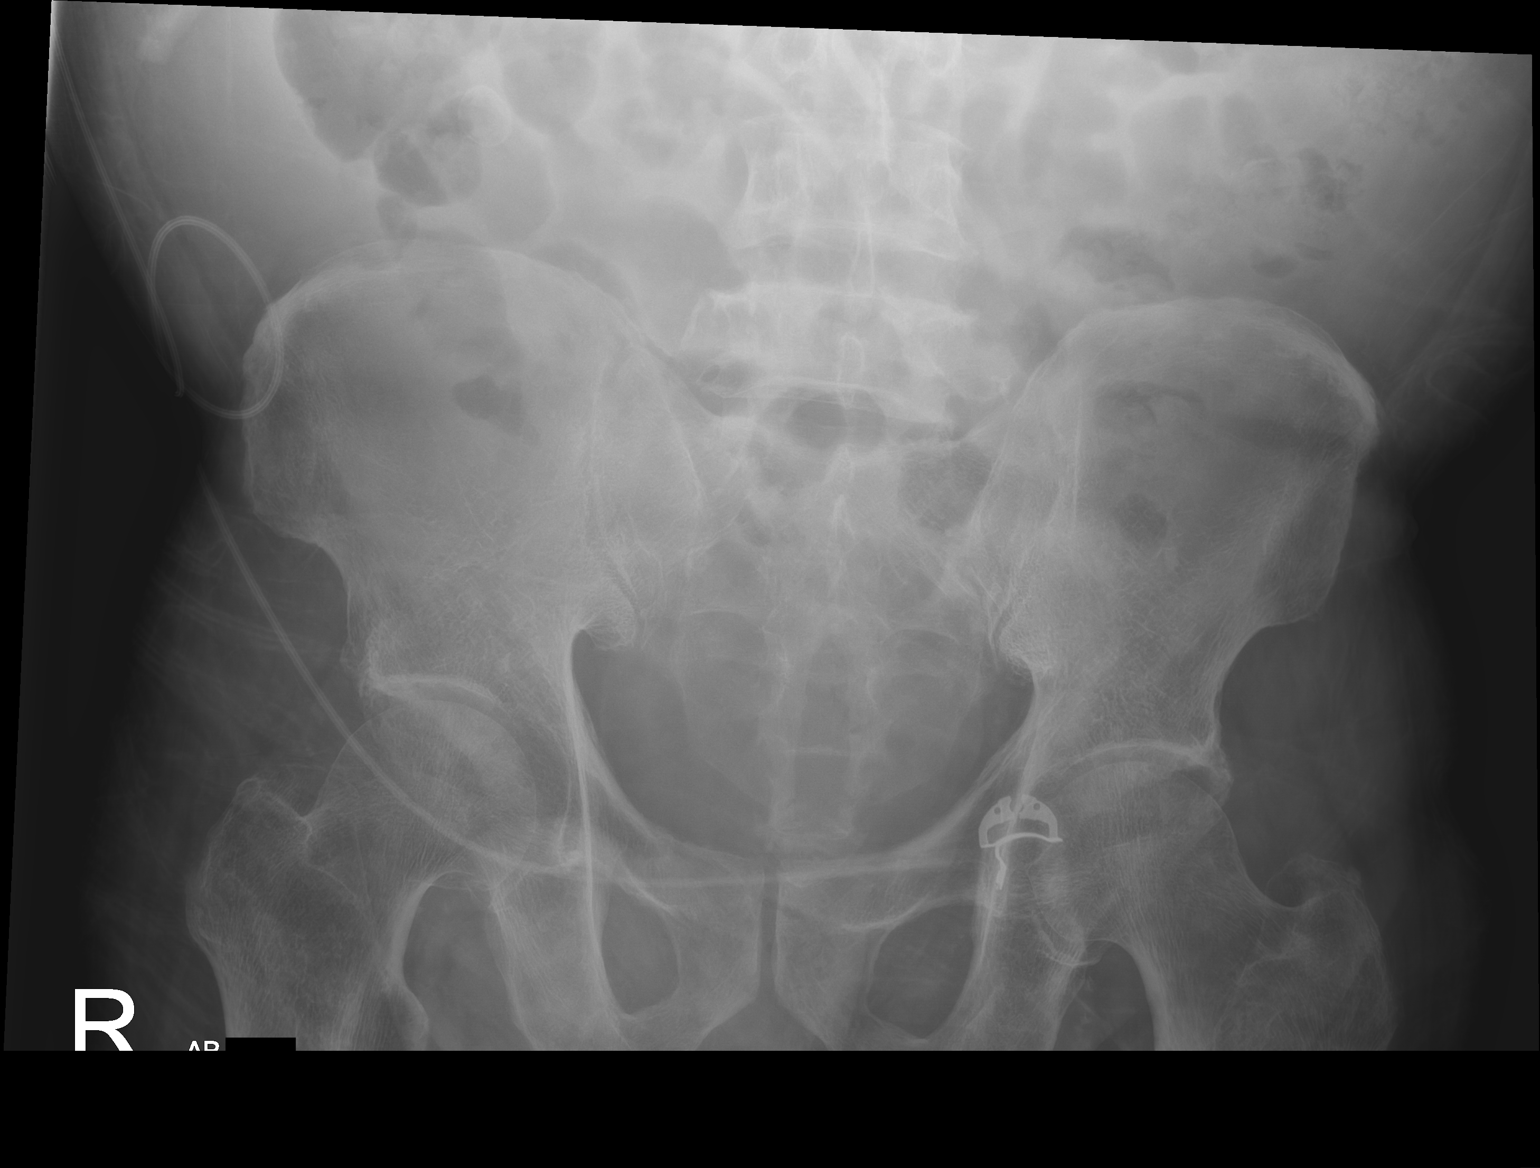

[2 of 2 positions shown; findings below may reference images not displayed]

FINDINGS: Evaluation is limited due to supine imaging. Within this limitation,
no free air, portal venous gas, or pneumatosis is identified. The
stomach is air-filled and mildly prominent. Otherwise, the bowel gas
pattern is nonobstructive. Large and small bowel loops are normal in
caliber. No other acute abnormalities.
IMPRESSION: Large and small bowel loops are normal in caliber. The stomach
contains air and is mildly prominent. No other abnormalities.

## 2022-02-11 MED ORDER — SODIUM CHLORIDE 0.9% FLUSH: 10.0000 mL | INTRAVENOUS | Status: DC | PRN

## 2022-02-11 MED ORDER — CHLORHEXIDINE GLUCONATE CLOTH 2 % EX PADS
6.0000 | MEDICATED_PAD | Freq: Every day | CUTANEOUS | Status: DC
  Administered 2022-02-11 – 2022-02-14 (×4): 6 via TOPICAL

## 2022-02-11 MED ORDER — DICYCLOMINE HCL 10 MG PO CAPS
10.0000 mg | ORAL_CAPSULE | Freq: Four times a day (QID) | ORAL | Status: DC | PRN
  Administered 2022-02-11: 10 mg via ORAL
  Filled 2022-02-11: qty 1

## 2022-02-11 MED ORDER — POTASSIUM CHLORIDE CRYS ER 20 MEQ PO TBCR
40.0000 meq | EXTENDED_RELEASE_TABLET | Freq: Once | ORAL | Status: AC
  Administered 2022-02-11: 40 meq via ORAL
  Filled 2022-02-11: qty 2

## 2022-02-11 MED ORDER — DOCUSATE SODIUM 100 MG PO CAPS
100.0000 mg | ORAL_CAPSULE | Freq: Two times a day (BID) | ORAL | Status: DC
  Administered 2022-02-11 – 2022-02-14 (×7): 100 mg via ORAL
  Filled 2022-02-11 (×7): qty 1

## 2022-02-11 MED ORDER — METOCLOPRAMIDE HCL 5 MG/ML IJ SOLN
5.0000 mg | Freq: Four times a day (QID) | INTRAMUSCULAR | Status: AC
  Administered 2022-02-11 – 2022-02-12 (×4): 5 mg via INTRAVENOUS
  Filled 2022-02-11 (×4): qty 2

## 2022-02-11 MED ORDER — K PHOS MONO-SOD PHOS DI & MONO 155-852-130 MG PO TABS
250.0000 mg | ORAL_TABLET | Freq: Three times a day (TID) | ORAL | Status: AC
  Administered 2022-02-11 – 2022-02-12 (×6): 250 mg via ORAL
  Filled 2022-02-11 (×6): qty 1

## 2022-02-11 MED ORDER — SODIUM CHLORIDE 0.9% FLUSH
10.0000 mL | Freq: Two times a day (BID) | INTRAVENOUS | Status: DC
  Administered 2022-02-12 – 2022-02-14 (×4): 10 mL

## 2022-02-11 NOTE — Plan of Care (Signed)
  Problem: Education: Goal: Knowledge of General Education information will improve Description Including pain rating scale, medication(s)/side effects and non-pharmacologic comfort measures Outcome: Progressing   Problem: Health Behavior/Discharge Planning: Goal: Ability to manage health-related needs will improve Outcome: Progressing   

## 2022-02-11 NOTE — Progress Notes (Signed)
[facility] ROUNDING NOTE   Subjective: No acute events overnight.  EGD completed yesterday with repeat gastric biopsies.  Biopsies pending.  He reports no BM in the last 24 hours.  He has been having intermittent abdominal bloating and cramping, along with decreased bowel habits for some time now.  Was able to get up and ambulate the halls 3 times yesterday and this morning.  Good appetite, but still early satiety.  No hematochezia or melena.  History obtained via [name] interpreter   Objective: Vital signs in last 24 hours: Temp:  [98.1 F (36.7 C)-98.5 F (36.9 C)] 98.5 F (36.9 C) (06/04 0537) Pulse Rate:  [85-103] 100 (06/04 0537) Resp:  [16-18] 16 (06/04 0537) BP: (84-115)/(58-77) 93/59 (06/04 0537) SpO2:  [96 %] 96 % (06/04 0537) Weight:  [10 kg] 69 kg (06/04 0537) Last BM Date : 02/09/22 General: NAD Lungs:  CTA b/l, no w/r/r Heart:  RRR, no m/r/g Abdomen: Minimal generalized TTP without guarding or rebound.  No peritoneal signs.  Abdomen otherwise soft. +BS    Intake/Output from previous day: 06/03 0701 - 06/04 0700 In: 382.3 [P.O.:60; I.V.:322.3] Out: -  Intake/Output this shift: Total I/O In: 3 [I.V.:3] Out: -    Lab Results: Recent Labs    02/09/22 0524 02/10/22 0111 02/11/22 0551  WBC 2.5* 2.2* 2.1*  HGB 10.6* 10.0* 9.3*  PLT 88* 94* 99*  MCV 97.5 98.6 98.5   BMET Recent Labs    02/09/22 0524 02/10/22 0111 02/11/22 0551  NA 140 140 141  K 3.1* 3.0* 3.6  CL 113* 114* 116*  CO2 21* 20* 21*  GLUCOSE 103* 170* 182*  BUN '13 15 12  '$ CREATININE 1.05 0.87 0.94  CALCIUM 8.2* 8.1* 8.3*   LFT Recent Labs    02/09/22 0524 02/10/22 0111 02/11/22 0551  PROT 5.1* 4.7* 4.7*  ALBUMIN 2.6* 2.6* 2.4*  AST 38 37 35  ALT 36 34 33  ALKPHOS 204* 200* 191*  BILITOT 2.2* 1.7* 1.2   PT/INR No results for input(s): INR in the last 72 hours.    Imaging/Other results: No results found.    Assessment and Plan:  1) Gastric cancer  with liver metastasis - Underwent repeat EGD on 02/10/2022.  No active bleeding, but high clinical suspicion for recurrent mild oozing from gastric cancer.  Biopsies pending - Treatment plan per Oncologist  2) Acute blood loss anemia 3) Symptomatic anemia - H/H slight downtrend today at 9.3/27 from hemoglobin 10 yesterday.  Received 2 unit PRBC transfusion earlier on admission hemoglobin 8.7 as outpatient.  No overt bleeding since arrival - As above, highest clinical suspicion for recurrent oozing from gastric CA site.  No active bleeding on EGD on this admission and on last admission.  May require palliative radiation to the to likely bleeding site  4) Abdominal bloating 5) Constipation 6) Early satiety - Start Colace 100 mg bid.  If suboptimal response, discuss starting a laxative - Start Bentyl 10 mg prn Q6H - Continue ambulation around the halls - Continue p.o. intake as tolerated with Ensure for supplemental caloric intake - Continue Megace  Inpatient GI service will sign off at this time.  Please do not hesitate to contact us with additional questions or concerns  Lavena Bullion, DO  02/11/2022, 12:45 PM [facility] Pager [phone]

## 2022-02-11 NOTE — Progress Notes (Signed)
PROGRESS NOTE    Jimmy Blankenship  WLN:[id number] DOB: October 12, 1950 DOA: 02/08/2022 PCP: Jimmy Holstein, MD    Brief Narrative:  Patient is a 71 year old Jimmy Blankenship male with past medical history significant for gastric cancer mets, GI bleeding/anemia, CAD status post CABG, CHF, diabetes, hyperlipidemia, hypertension, BPH, CKD, LV thrombus presenting with GI bleeding and symptomatic anemia.  Patient has undergone EGD (see documentation).  GI input is appreciated.  Continue to monitor H/H.  Possible discharge back on tomorrow if patient remains stable.   Assessment and Plan: GI bleed/Symptomatic anemia > Patient presenting with GI bleeding and symptomatic anemia in the setting of gastric cancer as below. > Received 2 units outpatient ordered by oncology and is being admitted for endoscopic evaluation for possible progression of stomach cancer > trend CBC 02/10/2022: Hemoglobin has remained stable. 02/11/2022: Hemoglobin is 9.3 g/dL today.  Continue to monitor.   Gastric cancer > Known history of gastric cancer with liver mets and recurrent GI bleeding and symptomatic anemia. > Currently on Enhertu and iron supplementation > Follows with Dr. Marin Blankenship.  Oncology has seen patient.   -GI team is also seen patient.  Patient has undergone EGD. -As per oncology oncology: does have a obvious bleeding tumor in his stomach, that would get Radiation Oncology to see him and see about doing palliative radiation to his stomach.  Echo ordered  CAD > Status post CABG - Continue home atorvastatin, metoprolol  CHF > Last echo in 2020 with EF 40-45%, G1 DD, moderately reduced RV function > Not currently on any diuretics - Continue home metoprolol  Diabetes - SSI  Hyperlipidemia - Continue home atorvastatin  Hypertension > Low normal blood pressure in the ED - Continue home metoprolol  BPH - Continue home tamsulosin  CKD > Creatinine near baseline at 1.2  Abdominal distention: -Abdominal x-ray  revealed gas in stomach. -Keep patient n.p.o. except for ice chips and medication. -IV Reglan 5 mg every 6 hourly x24 hours are reviewed.   DVT prophylaxis: SCDs Start: 02/08/22 1847    Code Status: DNR   Disposition Plan:  Level of care: Telemetry Status is: Observation The patient will require care spanning > 2 midnights and should be moved to inpatient because: needs EGD    Consultants:  GI oncology   Subjective: Abdominal distention.  Objective: Vitals:   02/10/22 1344 02/10/22 1348 02/10/22 2133 02/11/22 0537  BP: (!) 84/58 91/65 115/77 (!) 93/59  Pulse: 85  (!) 103 100  Resp: '18  18 16  '$ Temp: 98.1 F (36.7 C)  98.4 F (36.9 C) 98.5 F (36.9 C)  TempSrc: Oral  Oral Oral  SpO2: 96%  96% 96%  Weight:    69 kg    Intake/Output Summary (Last 24 hours) at 02/11/2022 0945 Last data filed at 02/11/2022 0858 Gross per 24 hour  Intake 63 ml  Output --  Net 63 ml    Filed Weights   02/10/22 0520 02/11/22 0537  Weight: 69.1 kg 69 kg    Examination: General condition: Patient is not in any distress.  Patient is awake and alert. HEENT: Patient is pale. Neck: Supple. Lungs: Clear to auscultation. CVS: S1-S2. Abdomen: Gaseous distention. Neuro: Awake and alert. Extremities no edema.   Data Reviewed: I have personally reviewed following labs and imaging studies  CBC: Recent Labs  Lab 02/07/22 1012 02/08/22 1553 02/09/22 0524 02/10/22 0111 02/11/22 0551  WBC 2.9* 2.4* 2.5* 2.2* 2.1*  NEUTROABS 1.9  --   --  1.2*  1.2*  HGB 8.7* 11.1* 10.6* 10.0* 9.3*  HCT 25.6* 33.0* 31.0* 29.2* 27.0*  MCV 101.2* 98.2 97.5 98.6 98.5  PLT 127* 112* 88* 94* 99*    Basic Metabolic Panel: Recent Labs  Lab 02/07/22 1012 02/08/22 1553 02/09/22 0524 02/10/22 0111 02/11/22 0551  NA 141 141 140 140 141  K 3.1* 3.3* 3.1* 3.0* 3.6  CL 110 112* 113* 114* 116*  CO2 20* 18* 21* 20* 21*  GLUCOSE 182* 144* 103* 170* 182*  BUN '13 14 13 15 12  '$ CREATININE 0.96 1.21 1.05 0.87  0.94  CALCIUM 8.6* 8.6* 8.2* 8.1* 8.3*  MG  --  1.4*  --  1.4* 1.8  PHOS  --   --   --   --  1.7*    GFR: Estimated Creatinine Clearance: 72.4 mL/min (by C-G formula based on SCr of 0.94 mg/dL). Liver Function Tests: Recent Labs  Lab 02/07/22 1012 02/08/22 1553 02/09/22 0524 02/10/22 0111 02/11/22 0551  AST 37 43* 38 37 35  ALT 36 41 36 34 33  ALKPHOS 225* 214* 204* 200* 191*  BILITOT 1.5* 2.9* 2.2* 1.7* 1.2  PROT 5.4* 5.3* 5.1* 4.7* 4.7*  ALBUMIN 3.3* 2.9* 2.6* 2.6* 2.4*    No results for input(s): LIPASE, AMYLASE in the last 168 hours. No results for input(s): AMMONIA in the last 168 hours. Coagulation Profile: No results for input(s): INR, PROTIME in the last 168 hours. Cardiac Enzymes: No results for input(s): CKTOTAL, CKMB, CKMBINDEX, TROPONINI in the last 168 hours. BNP (last 3 results) No results for input(s): PROBNP in the last 8760 hours. HbA1C: No results for input(s): HGBA1C in the last 72 hours. CBG: Recent Labs  Lab 02/10/22 0737 02/10/22 1142 02/10/22 1721 02/10/22 2135 02/11/22 0752  GLUCAP 113* 185* 237* 187* 162*    Lipid Profile: No results for input(s): CHOL, HDL, LDLCALC, TRIG, CHOLHDL, LDLDIRECT in the last 72 hours. Thyroid Function Tests: No results for input(s): TSH, T4TOTAL, FREET4, T3FREE, THYROIDAB in the last 72 hours. Anemia Panel: No results for input(s): VITAMINB12, FOLATE, FERRITIN, TIBC, IRON, RETICCTPCT in the last 72 hours. Sepsis Labs: No results for input(s): PROCALCITON, LATICACIDVEN in the last 168 hours.  No results found for this or any previous visit (from the past 240 hour(s)).       Radiology Studies: ECHOCARDIOGRAM COMPLETE  Result Date: 02/10/2022    ECHOCARDIOGRAM REPORT   Patient Name:   Jimmy Blankenship Date of Exam: 02/09/2022 Medical Rec #:  [id number]     Height:       69.0 in Accession #:    [id number]    Weight:       147.0 lb Date of Birth:  [birth year]     BSA:          1.813 m Patient Age:    64 years       BP:           99/61 mmHg Patient Gender: M             HR:           105 bpm. Exam Location:  Inpatient Procedure: 2D Echo Indications:    Chemo. History of intracardiac thrombus.  History:        Patient has prior history of Echocardiogram examinations, most                 recent 08/28/2021. CAD, Cancer; Risk Factors:Dyslipidemia,  Hypertension and Diabetes.  Sonographer:    Joette Catching Referring Phys: [place]  1. No left ventricular thrombus is seen with Definity contrast. Left ventricular ejection fraction, by estimation, is 35 to 40%. The left ventricle has moderately decreased function. The left ventricle demonstrates regional wall motion abnormalities (see scoring diagram/findings for description). Left ventricular diastolic parameters are consistent with Grade I diastolic dysfunction (impaired relaxation). There is dyskinesis of the entire left ventricular apex.  2. Right ventricular systolic function is normal. The right ventricular size is normal. There is normal pulmonary artery systolic pressure.  3. Left atrial size was severely dilated.  4. The mitral valve is normal in structure. Moderate mitral valve regurgitation.  5. The aortic valve is tricuspid. There is mild calcification of the aortic valve. There is mild thickening of the aortic valve. Aortic valve regurgitation is not visualized. Aortic valve sclerosis is present, with no evidence of aortic valve stenosis.  6. The inferior vena cava is dilated in size with >50% respiratory variability, suggesting right atrial pressure of 8 mmHg. Comparison(s): No significant change from prior study. Prior images reviewed side by side. FINDINGS  Left Ventricle: No left ventricular thrombus is seen with Definity contrast. Left ventricular ejection fraction, by estimation, is 35 to 40%. The left ventricle has moderately decreased function. The left ventricle demonstrates regional wall motion abnormalities. Definity  contrast agent was given IV to delineate the left ventricular endocardial borders. The left ventricular internal cavity size was normal in size. There is no left ventricular hypertrophy. Left ventricular diastolic parameters are consistent with Grade I diastolic dysfunction (impaired relaxation). Normal left ventricular filling pressure.  LV Wall Scoring: The entire apex is dyskinetic. Right Ventricle: The right ventricular size is normal. No increase in right ventricular wall thickness. Right ventricular systolic function is normal. There is normal pulmonary artery systolic pressure. The tricuspid regurgitant velocity is 2.19 m/s, and  with an assumed right atrial pressure of 3 mmHg, the estimated right ventricular systolic pressure is 06.2 mmHg. Left Atrium: Left atrial size was severely dilated. Right Atrium: Right atrial size was normal in size. Pericardium: There is no evidence of pericardial effusion. Mitral Valve: The mitral valve is normal in structure. Moderate mitral valve regurgitation, with eccentric posteriorly directed jet. Tricuspid Valve: The tricuspid valve is normal in structure. Tricuspid valve regurgitation is mild. Aortic Valve: The aortic valve is tricuspid. There is mild calcification of the aortic valve. There is mild thickening of the aortic valve. Aortic valve regurgitation is not visualized. Aortic valve sclerosis is present, with no evidence of aortic valve stenosis. Aortic valve mean gradient measures 4.0 mmHg. Aortic valve peak gradient measures 6.1 mmHg. Aortic valve area, by VTI measures 3.86 cm. Pulmonic Valve: The pulmonic valve was normal in structure. Pulmonic valve regurgitation is not visualized. Aorta: The aortic root is normal in size and structure. Venous: The inferior vena cava is dilated in size with greater than 50% respiratory variability, suggesting right atrial pressure of 8 mmHg. IAS/Shunts: No atrial level shunt detected by color flow Doppler. Additional Comments: A  venous catheter is visualized in the right atrium. There is pleural effusion in the left lateral region.  LEFT VENTRICLE PLAX 2D LVIDd:         4.00 cm      Diastology LVIDs:         3.40 cm      LV e' lateral:   13.40 cm/s LV PW:  1.10 cm      LV E/e' lateral: 3.9 LV IVS:        1.10 cm LVOT diam:     2.20 cm LV SV:         81 LV SV Index:   45 LVOT Area:     3.80 cm  LV Volumes (MOD) LV vol d, MOD A2C: 114.0 ml LV vol d, MOD A4C: 123.0 ml LV vol s, MOD A2C: 63.9 ml LV vol s, MOD A4C: 85.8 ml LV SV MOD A2C:     50.1 ml LV SV MOD A4C:     123.0 ml LV SV MOD BP:      46.7 ml RIGHT VENTRICLE            IVC RV Basal diam:  2.40 cm    IVC diam: 2.00 cm RV Mid diam:    1.90 cm RV S prime:     8.27 cm/s TAPSE (M-mode): 0.6 cm LEFT ATRIUM             Index        RIGHT ATRIUM           Index LA diam:        4.90 cm 2.70 cm/m   RA Area:     16.40 cm LA Vol (A2C):   48.8 ml 26.92 ml/m  RA Volume:   35.20 ml  19.42 ml/m LA Vol (A4C):   52.3 ml 28.85 ml/m LA Biplane Vol: 51.7 ml 28.52 ml/m  AORTIC VALVE                     PULMONIC VALVE AV Area (Vmax):    3.83 cm      PV Vmax:       0.99 m/s AV Area (Vmean):   3.25 cm      PV Peak grad:  3.9 mmHg AV Area (VTI):     3.86 cm AV Vmax:           123.00 cm/s AV Vmean:          104.000 cm/s AV VTI:            0.211 m AV Peak Grad:      6.1 mmHg AV Mean Grad:      4.0 mmHg LVOT Vmax:         124.00 cm/s LVOT Vmean:        88.900 cm/s LVOT VTI:          0.214 m LVOT/AV VTI ratio: 1.01  AORTA Ao Root diam: 3.50 cm MITRAL VALVE                  TRICUSPID VALVE MV Area (PHT): 9.85 cm       TR Peak grad:   19.2 mmHg MV Decel Time: 77 msec        TR Vmax:        219.00 cm/s MR Peak grad:    106.5 mmHg MR Mean grad:    65.0 mmHg    SHUNTS MR Vmax:         516.00 cm/s  Systemic VTI:  0.21 m MR Vmean:        387.0 cm/s   Systemic Diam: 2.20 cm MR PISA:         0.57 cm MR PISA Eff ROA: 4 mm MR PISA Radius:  0.30 cm MV E velocity: 52.00 cm/s MV A velocity: 114.00 cm/s MV  E/A ratio:  0.46  Dani Gobble Croitoru MD Electronically signed by Sanda Klein MD Signature Date/Time: 02/10/2022/10:06:18 AM    Final         Scheduled Meds:  atorvastatin  80 mg Oral Daily   feeding supplement  237 mL Oral BID BM   gabapentin  300 mg Oral TID   insulin aspart  0-9 Units Subcutaneous TID WC   megestrol  400 mg Oral BID   metoprolol tartrate  25 mg Oral BID   pantoprazole  40 mg Oral BID   phosphorus  250 mg Oral TID   potassium chloride  40 mEq Oral Once   sodium chloride flush  3 mL Intravenous Q12H   tamsulosin  0.4 mg Oral QHS   Continuous Infusions:    LOS: 2 days    Bonnell Public, MD [facility] Available via Epic secure chat 7am-7pm After these hours, please refer to coverage provider listed on [email or web address] 02/11/2022, 9:45 AM

## 2022-02-12 ENCOUNTER — Inpatient Hospital Stay (HOSPITAL_COMMUNITY): Payer: Medicare Other

## 2022-02-12 ENCOUNTER — Encounter (HOSPITAL_COMMUNITY): Payer: Self-pay | Admitting: Gastroenterology

## 2022-02-12 LAB — GLUCOSE, CAPILLARY
Glucose-Capillary: 169 mg/dL — ABNORMAL HIGH (ref 70–99)
Glucose-Capillary: 142 mg/dL — ABNORMAL HIGH (ref 70–99)
Glucose-Capillary: 259 mg/dL — ABNORMAL HIGH (ref 70–99)
Glucose-Capillary: 236 mg/dL — ABNORMAL HIGH (ref 70–99)

## 2022-02-12 LAB — RENAL FUNCTION PANEL
Albumin: 2.5 g/dL — ABNORMAL LOW (ref 3.5–5.0)
Anion gap: 5 (ref 5–15)
BUN: 9 mg/dL (ref 8–23)
CO2: 21 mmol/L — ABNORMAL LOW (ref 22–32)
Calcium: 8.2 mg/dL — ABNORMAL LOW (ref 8.9–10.3)
Chloride: 114 mmol/L — ABNORMAL HIGH (ref 98–111)
Creatinine, Ser: 0.88 mg/dL (ref 0.61–1.24)
GFR, Estimated: >60 mL/min (ref >60)
Glucose, Bld: 136 mg/dL — ABNORMAL HIGH (ref 70–99)
Phosphorus: 3 mg/dL (ref 2.5–4.6)
Potassium: 3.6 mmol/L (ref 3.5–5.1)
Sodium: 140 mmol/L (ref 135–145)

## 2022-02-12 LAB — CBC WITH DIFFERENTIAL/PLATELET
Abs Immature Granulocytes: 0.01 10*3/uL (ref 0.00–0.07)
Basophils Absolute: 0 10*3/uL (ref 0.0–0.1)
Basophils Relative: 1 %
Eosinophils Absolute: 0.1 10*3/uL (ref 0.0–0.5)
Eosinophils Relative: 3 %
HCT: 28.9 % — ABNORMAL LOW (ref 39.0–52.0)
Hemoglobin: 9.8 g/dL — ABNORMAL LOW (ref 13.0–17.0)
Immature Granulocytes: 0 %
Lymphocytes Relative: 21 %
Lymphs Abs: 0.6 10*3/uL — ABNORMAL LOW (ref 0.7–4.0)
MCH: 33.7 pg (ref 26.0–34.0)
MCHC: 33.9 g/dL (ref 30.0–36.0)
MCV: 99.3 fL (ref 80.0–100.0)
Monocytes Absolute: 0.5 10*3/uL (ref 0.1–1.0)
Monocytes Relative: 16 %
Neutro Abs: 1.7 10*3/uL (ref 1.7–7.7)
Neutrophils Relative %: 59 %
Platelets: 114 10*3/uL — ABNORMAL LOW (ref 150–400)
RBC: 2.91 MIL/uL — ABNORMAL LOW (ref 4.22–5.81)
RDW: 20.9 % — ABNORMAL HIGH (ref 11.5–15.5)
WBC: 2.9 10*3/uL — ABNORMAL LOW (ref 4.0–10.5)
nRBC: 0 % (ref 0.0–0.2)

## 2022-02-12 LAB — COMPREHENSIVE METABOLIC PANEL
ALT: 34 U/L (ref 0–44)
AST: 38 U/L (ref 15–41)
Albumin: 2.5 g/dL — ABNORMAL LOW (ref 3.5–5.0)
Alkaline Phosphatase: 207 U/L — ABNORMAL HIGH (ref 38–126)
Anion gap: 4 — ABNORMAL LOW (ref 5–15)
BUN: 10 mg/dL (ref 8–23)
CO2: 22 mmol/L (ref 22–32)
Calcium: 8.2 mg/dL — ABNORMAL LOW (ref 8.9–10.3)
Chloride: 115 mmol/L — ABNORMAL HIGH (ref 98–111)
Creatinine, Ser: 0.82 mg/dL (ref 0.61–1.24)
GFR, Estimated: >60 mL/min (ref >60)
Glucose, Bld: 141 mg/dL — ABNORMAL HIGH (ref 70–99)
Potassium: 3.6 mmol/L (ref 3.5–5.1)
Sodium: 141 mmol/L (ref 135–145)
Total Bilirubin: 1.6 mg/dL — ABNORMAL HIGH (ref 0.3–1.2)
Total Protein: 4.8 g/dL — ABNORMAL LOW (ref 6.5–8.1)

## 2022-02-12 LAB — MAGNESIUM: Magnesium: 1.6 mg/dL — ABNORMAL LOW (ref 1.7–2.4)

## 2022-02-12 IMAGING — US US ABDOMEN LIMITED
1 series · 14 of 14 positions shown · non-contrast
Comparison: CT abdomen [DATE]

CLINICAL DATA: Ascites.  Gastric cancer.

EXAM:
LIMITED ABDOMEN ULTRASOUND FOR ASCITES
TECHNIQUE: Limited ultrasound survey for ascites was performed in all four
abdominal quadrants.

[Series 1: us abdomen limited mc & wl · 14 of 14 slices shown]
[im 1/14]
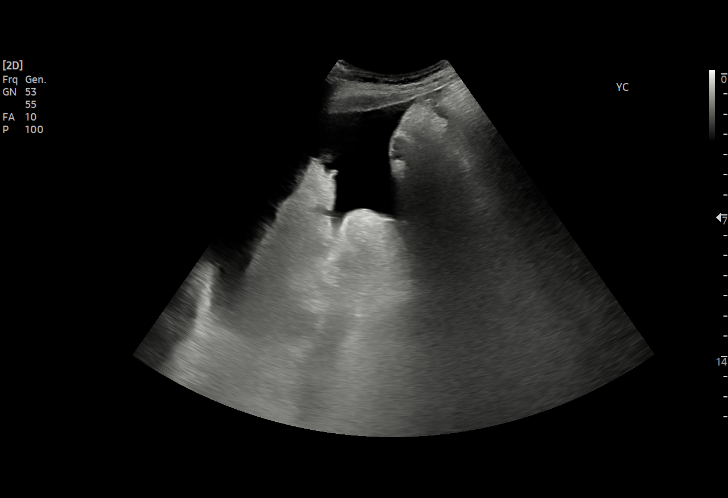
[im 2/14]
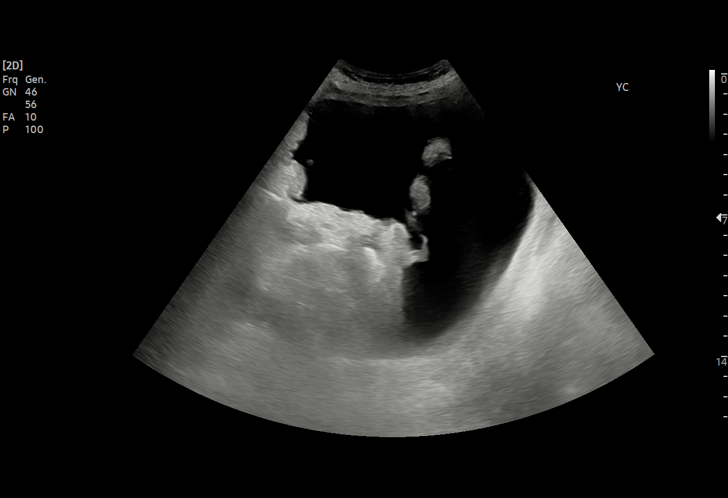
[im 3/14]
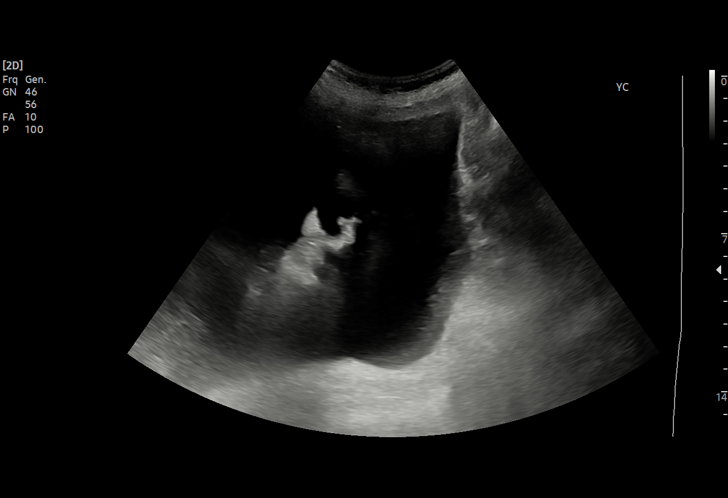
[im 4/14]
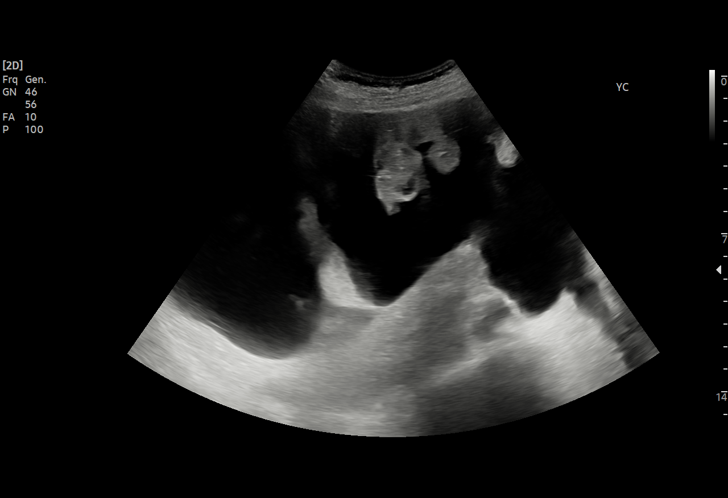
[im 5/14]
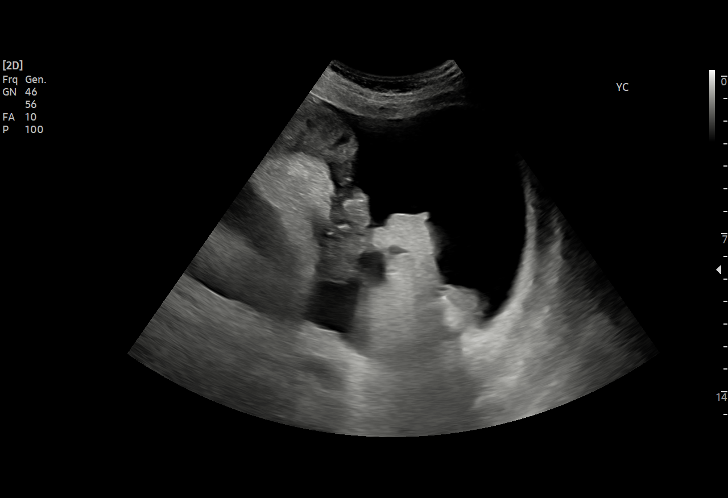
[im 6/14]
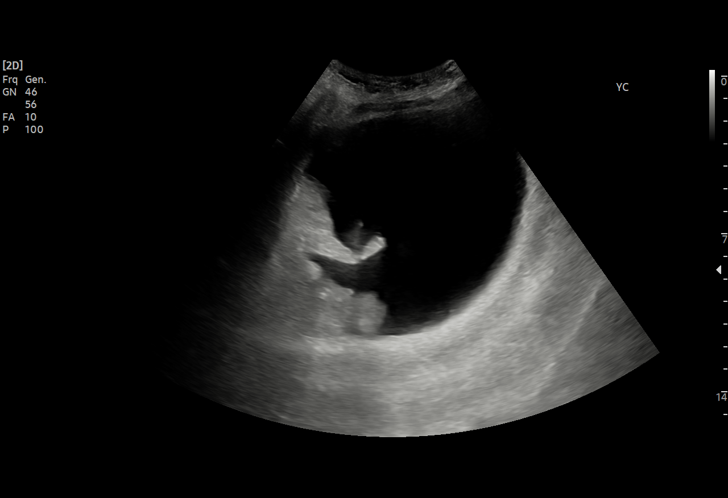
[im 7/14]
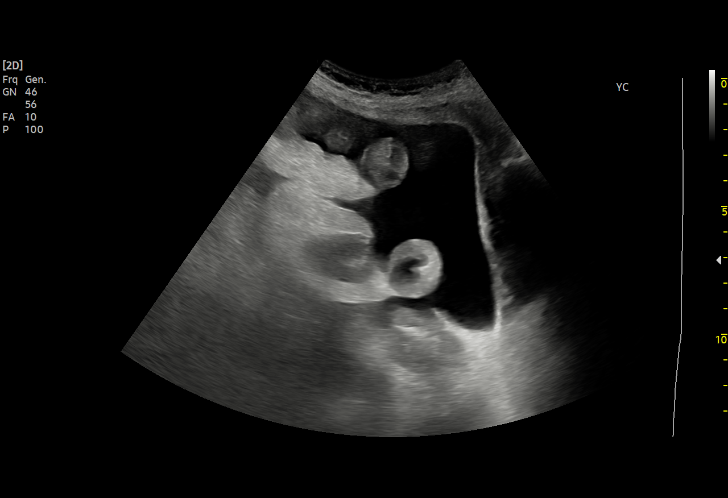
[im 8/14]
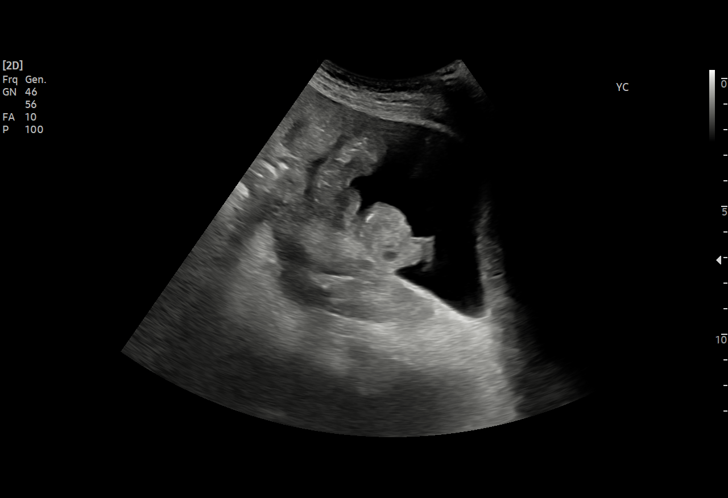
[im 9/14]
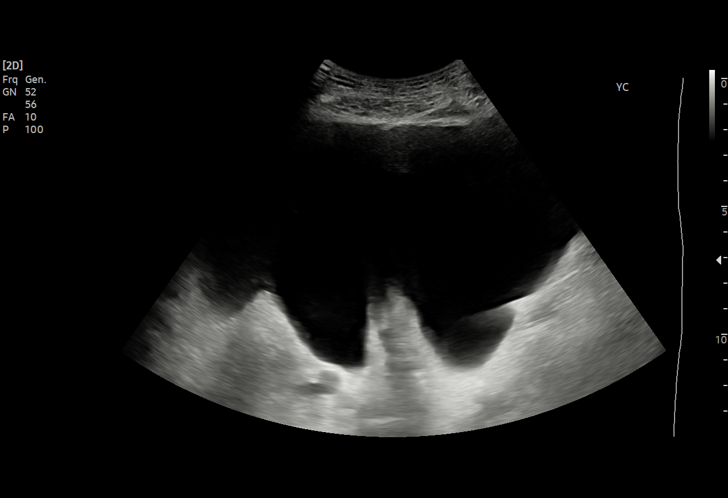
[im 10/14]
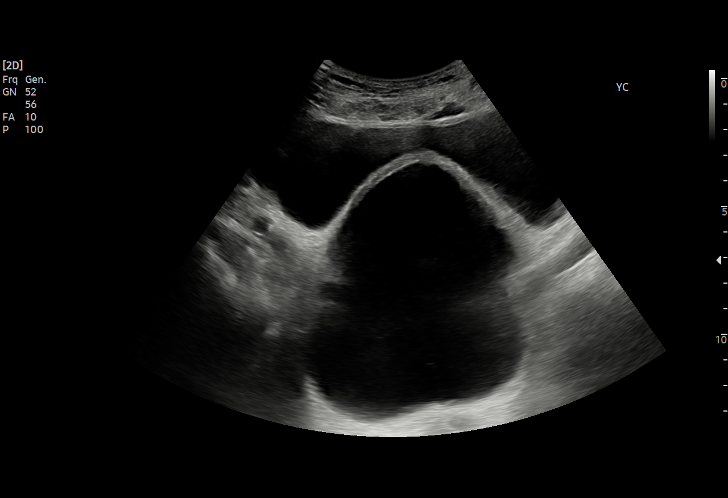
[im 11/14]
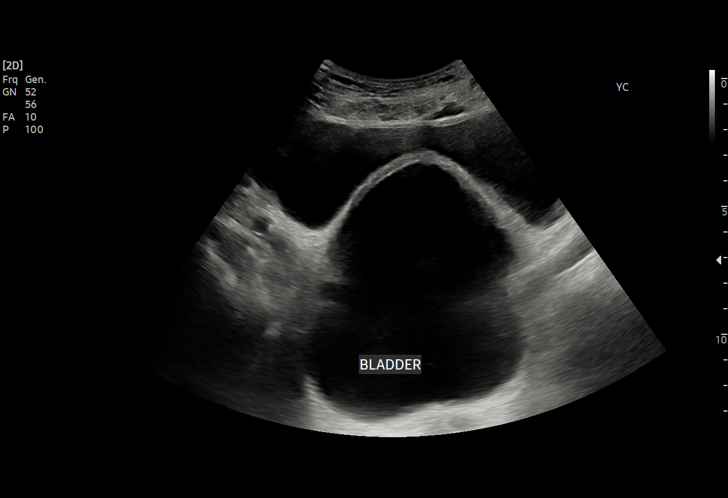
[im 12/14]
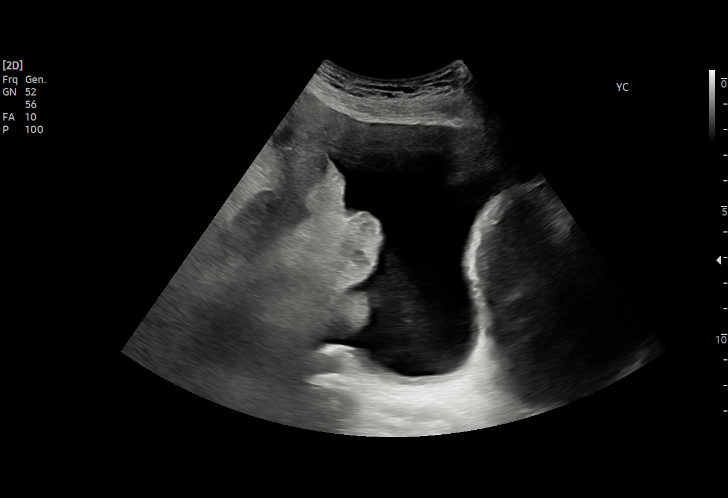
[im 13/14]
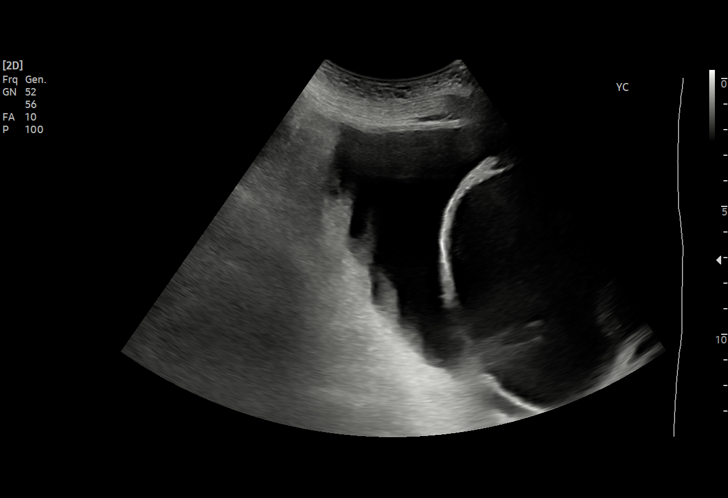
[im 14/14]
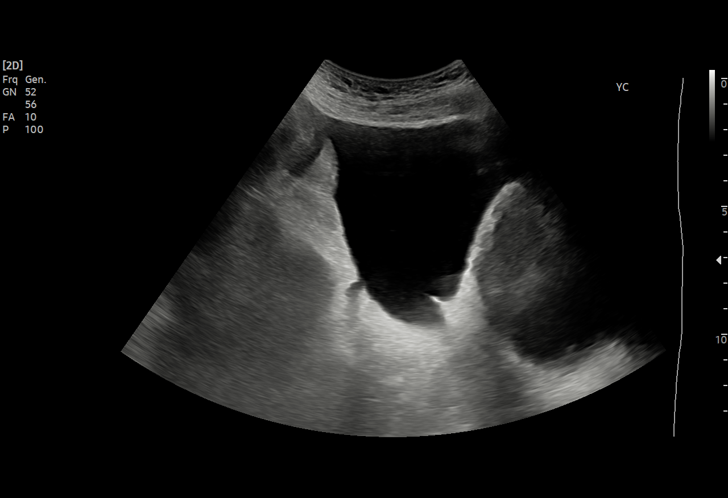

[14 of 14 positions shown; findings below may reference images not displayed]

FINDINGS: There is a moderate to large amount of ascites in the abdomen. This
represents a substantial increase compared to [DATE].
IMPRESSION: 1. Moderate to large amount of ascites, increased from [DATE].

## 2022-02-12 IMAGING — DX DG ABD PORTABLE 1V
1 series · 1 of 1 positions shown · non-contrast
Comparison: Radiograph dated [DATE]

CLINICAL DATA: [0C]; follow-up ileus

EXAM:
PORTABLE ABDOMEN - 1 VIEW

[abdomen kub]
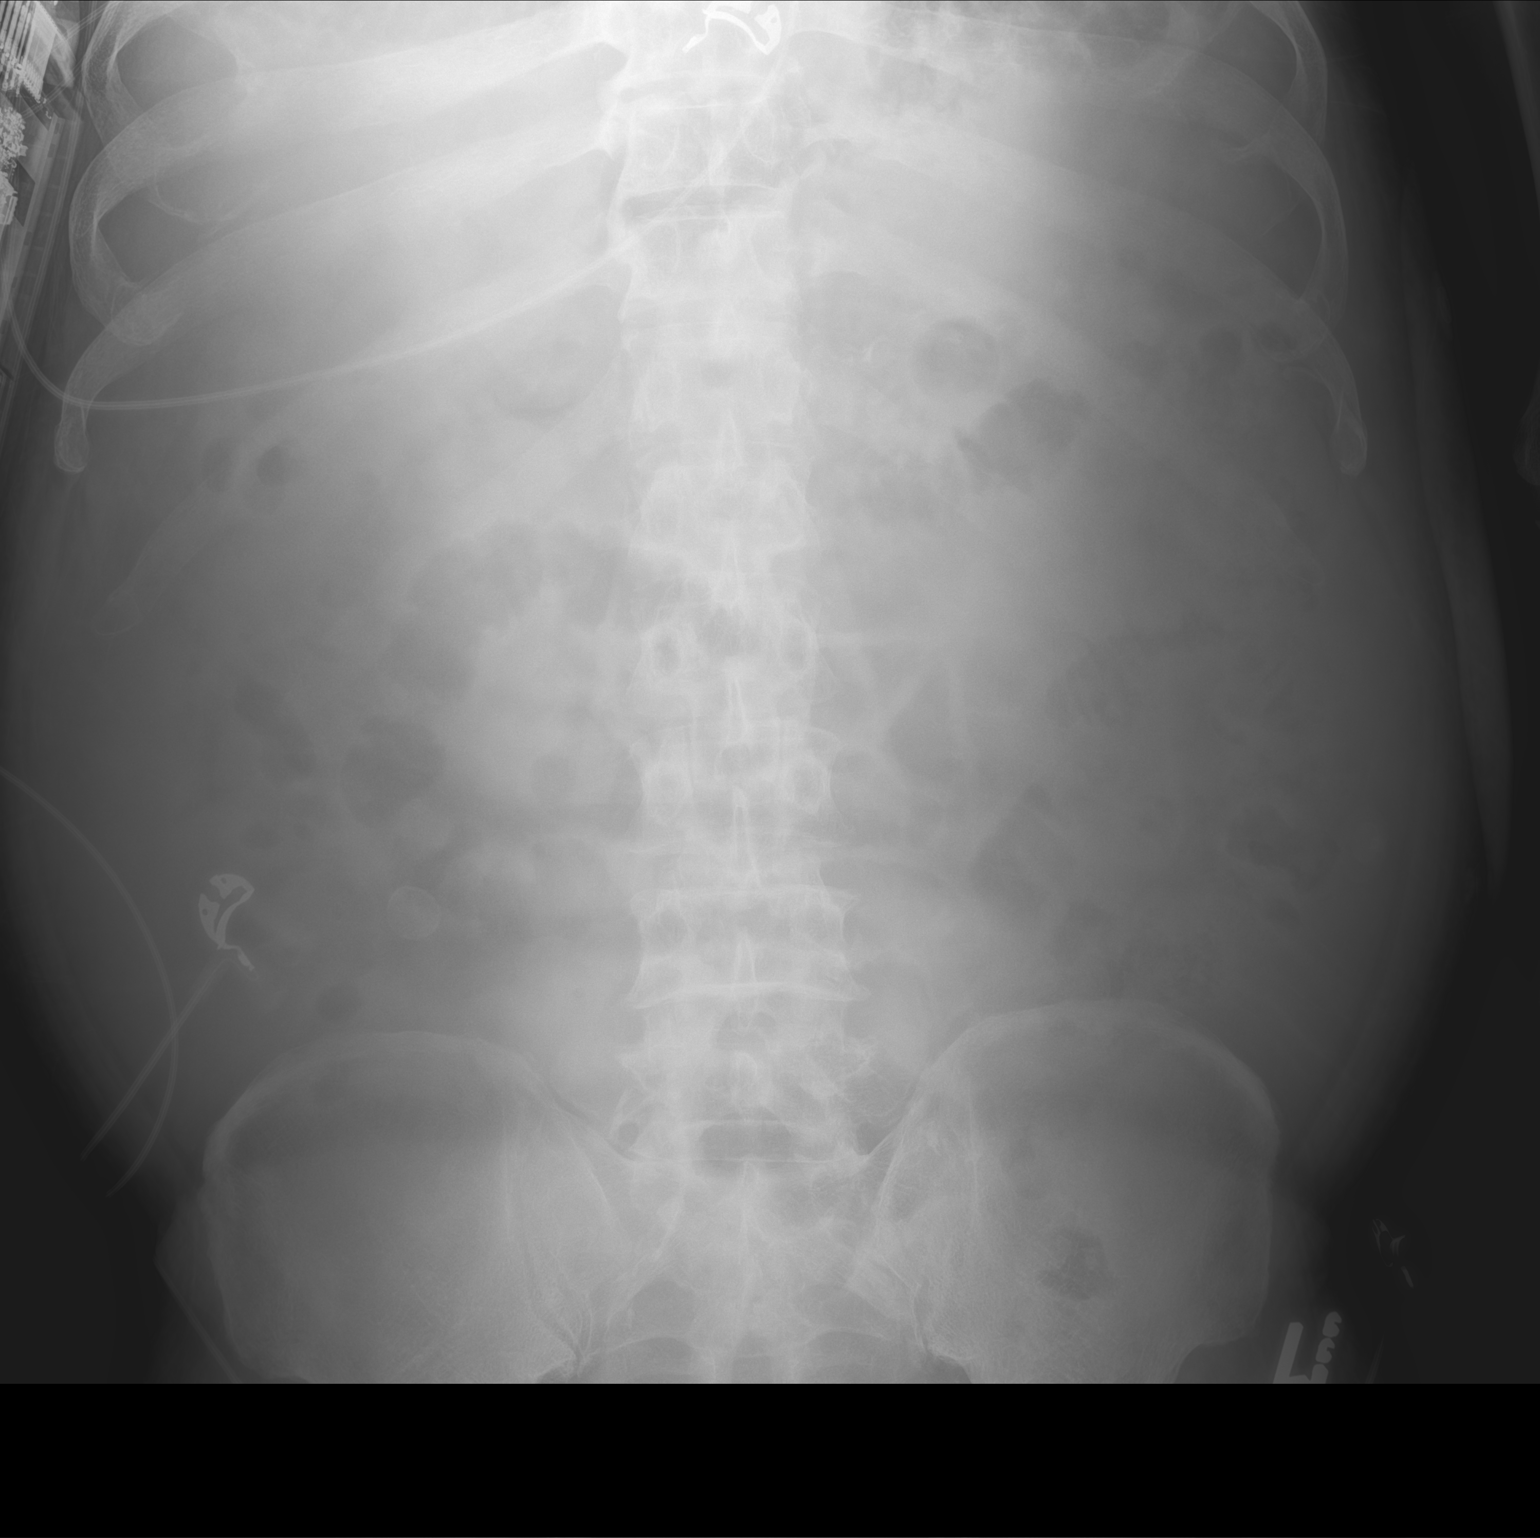

[1 of 1 positions shown; findings below may reference images not displayed]

FINDINGS: Air-filled nondilated loops of bowel. Incomplete assessment of the
pelvis and lung bases. Degenerative changes of the lower lumbar
spine.
IMPRESSION: Nonobstructive bowel gas pattern.

## 2022-02-12 MED ORDER — METOPROLOL TARTRATE 25 MG PO TABS
12.5000 mg | ORAL_TABLET | Freq: Two times a day (BID) | ORAL | Status: DC
  Administered 2022-02-12 – 2022-02-14 (×4): 12.5 mg via ORAL
  Filled 2022-02-12 (×4): qty 1

## 2022-02-12 NOTE — Progress Notes (Signed)
PROGRESS NOTE    Doctor Sheahan  ZOX:[id number] DOB: 11/05/1950 DOA: 02/08/2022 PCP: [name], MD    Brief Narrative:  Patient is a 71 year old [place] male with past medical history significant for gastric cancer mets, GI bleeding/anemia, CAD status post CABG, CHF, diabetes, hyperlipidemia, hypertension, BPH, CKD, LV thrombus presenting with GI bleeding and symptomatic anemia.  Patient has undergone EGD (see documentation).  GI input is appreciated.  Pending U/s to r/o ascites.  Slowly advance diet   Assessment and Plan: GI bleed/Symptomatic anemia > Patient presenting with GI bleeding and symptomatic anemia in the setting of gastric cancer as below. >s/p 2 units  - admitted for endoscopic evaluation for possible progression of stomach cancer -h/h stable -s/p EGD: No active bleeding on this study and no lesions                            amenable to endoscopic intervention. Suspect the                            anemia and melena is from intermittent malignant                            oozing. If concern for acute rebleeding, would                            consider either IR or Radiation Oncology                            consultation.   Gastric cancer > Known history of gastric cancer with liver mets and recurrent GI bleeding and symptomatic anemia. > Follows with Dr. Marin Olp.  Oncology has seen patient.   -GI team is also seen patient.  Patient has undergone EGD. -oncology to consult rad onc  CAD > Status post CABG - Continue home atorvastatin, metoprolol  CHF > Last echo in 2020 with EF 40-45%, G1 DD, moderately reduced RV function > Not currently on any diuretics - Continue home metoprolol  Diabetes - SSI  Hyperlipidemia - Continue home atorvastatin  Hypertension > Low normal blood pressure in the ED - Continue home metoprolol  BPH - Continue home tamsulosin  CKD > Creatinine near baseline at 1.2  Abdominal distention: -U/S pending to r/o ascites     DVT prophylaxis: SCDs Start: 02/08/22 1847    Code Status: DNR   Disposition Plan:  Level of care: Telemetry Status is: inpt    Consultants:  GI oncology   Subjective: hungry  Objective: Vitals:   02/11/22 1415 02/11/22 2131 02/12/22 0504 02/12/22 1237  BP: 100/61 96/63 107/67 97/62  Pulse: 92 83 87 80  Resp: '19 14 16 14  '$ Temp: 98.2 F (36.8 C) 98.4 F (36.9 C) 98.3 F (36.8 C) 97.9 F (36.6 C)  TempSrc: Oral Oral Oral   SpO2: 100% 97% 98% 97%  Weight:   69.5 kg    No intake or output data in the 24 hours ending 02/12/22 1256 Filed Weights   02/10/22 0520 02/11/22 0537 02/12/22 0504  Weight: 69.1 kg 69 kg 69.5 kg    Examination: General condition: Patient is not in any distress.  Patient is awake and alert. HEENT: Patient is pale. Neck: Supple. Lungs: Clear to auscultation. CVS: S1-S2. Abdomen:  Gaseous distention. Neuro: Awake and alert. Extremities no edema.   Data Reviewed: I have personally reviewed following labs and imaging studies  CBC: Recent Labs  Lab 02/07/22 1012 02/08/22 1553 02/09/22 0524 02/10/22 0111 02/11/22 0551 02/12/22 1132  WBC 2.9* 2.4* 2.5* 2.2* 2.1* 2.9*  NEUTROABS 1.9  --   --  1.2* 1.2* 1.7  HGB 8.7* 11.1* 10.6* 10.0* 9.3* 9.8*  HCT 25.6* 33.0* 31.0* 29.2* 27.0* 28.9*  MCV 101.2* 98.2 97.5 98.6 98.5 99.3  PLT 127* 112* 88* 94* 99* 132*   Basic Metabolic Panel: Recent Labs  Lab 02/08/22 1553 02/09/22 0524 02/10/22 0111 02/11/22 0551 02/12/22 1131 02/12/22 1132  NA 141 140 140 141 140 141  K 3.3* 3.1* 3.0* 3.6 3.6 3.6  CL 112* 113* 114* 116* 114* 115*  CO2 18* 21* 20* 21* 21* 22  GLUCOSE 144* 103* 170* 182* 136* 141*  BUN '14 13 15 12 9 10  '$ CREATININE 1.21 1.05 0.87 0.94 0.88 0.82  CALCIUM 8.6* 8.2* 8.1* 8.3* 8.2* 8.2*  MG 1.4*  --  1.4* 1.8  --  1.6*  PHOS  --   --   --  1.7* 3.0  --    GFR: Estimated Creatinine Clearance: 83.6 mL/min (by C-G formula based on SCr of 0.82 mg/dL). Liver Function  Tests: Recent Labs  Lab 02/08/22 1553 02/09/22 0524 02/10/22 0111 02/11/22 0551 02/12/22 1131 02/12/22 1132  AST 43* 38 37 35  --  38  ALT 41 36 34 33  --  34  ALKPHOS 214* 204* 200* 191*  --  207*  BILITOT 2.9* 2.2* 1.7* 1.2  --  1.6*  PROT 5.3* 5.1* 4.7* 4.7*  --  4.8*  ALBUMIN 2.9* 2.6* 2.6* 2.4* 2.5* 2.5*   No results for input(s): LIPASE, AMYLASE in the last 168 hours. No results for input(s): AMMONIA in the last 168 hours. Coagulation Profile: No results for input(s): INR, PROTIME in the last 168 hours. Cardiac Enzymes: No results for input(s): CKTOTAL, CKMB, CKMBINDEX, TROPONINI in the last 168 hours. BNP (last 3 results) No results for input(s): PROBNP in the last 8760 hours. HbA1C: No results for input(s): HGBA1C in the last 72 hours. CBG: Recent Labs  Lab 02/11/22 1246 02/11/22 1646 02/11/22 2132 02/12/22 0717 02/12/22 1234  GLUCAP 249* 202* 174* 169* 142*   Lipid Profile: No results for input(s): CHOL, HDL, LDLCALC, TRIG, CHOLHDL, LDLDIRECT in the last 72 hours. Thyroid Function Tests: No results for input(s): TSH, T4TOTAL, FREET4, T3FREE, THYROIDAB in the last 72 hours. Anemia Panel: No results for input(s): VITAMINB12, FOLATE, FERRITIN, TIBC, IRON, RETICCTPCT in the last 72 hours. Sepsis Labs: No results for input(s): PROCALCITON, LATICACIDVEN in the last 168 hours.  No results found for this or any previous visit (from the past 240 hour(s)).       Radiology Studies: DG Abd 1 View  Result Date: 02/11/2022 CLINICAL DATA:  Abdominal distension EXAM: ABDOMEN - 1 VIEW COMPARISON:  February 20, 2021 FINDINGS: Evaluation is limited due to supine imaging. Within this limitation, no free air, portal venous gas, or pneumatosis is identified. The stomach is air-filled and mildly prominent. Otherwise, the bowel gas pattern is nonobstructive. Large and small bowel loops are normal in caliber. No other acute abnormalities. IMPRESSION: Large and small bowel loops are  normal in caliber. The stomach contains air and is mildly prominent. No other abnormalities. Electronically Signed   By: Dorise Bullion III M.D.   On: 02/11/2022 12:42   DG Abd Portable  1V  Result Date: 02/12/2022 CLINICAL DATA:  03474; follow-up ileus EXAM: PORTABLE ABDOMEN - 1 VIEW COMPARISON:  Radiograph dated February 11, 2022 FINDINGS: Air-filled nondilated loops of bowel. Incomplete assessment of the pelvis and lung bases. Degenerative changes of the lower lumbar spine. IMPRESSION: Nonobstructive bowel gas pattern. Electronically Signed   By: Valentino Saxon M.D.   On: 02/12/2022 08:53        Scheduled Meds:  atorvastatin  80 mg Oral Daily   Chlorhexidine Gluconate Cloth  6 each Topical Daily   docusate sodium  100 mg Oral BID   feeding supplement  237 mL Oral BID BM   gabapentin  300 mg Oral TID   insulin aspart  0-9 Units Subcutaneous TID WC   megestrol  400 mg Oral BID   metoprolol tartrate  25 mg Oral BID   pantoprazole  40 mg Oral BID   phosphorus  250 mg Oral TID   sodium chloride flush  10-40 mL Intracatheter Q12H   tamsulosin  0.4 mg Oral QHS   Continuous Infusions:    LOS: 3 days    Geradine Girt, DO [facility] Available via Epic secure chat 7am-7pm After these hours, please refer to coverage provider listed on [email or web address] 02/12/2022, 12:56 PM

## 2022-02-13 ENCOUNTER — Inpatient Hospital Stay (HOSPITAL_COMMUNITY): Payer: Medicare Other

## 2022-02-13 ENCOUNTER — Encounter: Payer: Self-pay | Admitting: Hematology & Oncology

## 2022-02-13 DIAGNOSIS — C169 Malignant neoplasm of stomach, unspecified: Secondary | ICD-10-CM | POA: Diagnosis not present

## 2022-02-13 DIAGNOSIS — C787 Secondary malignant neoplasm of liver and intrahepatic bile duct: Secondary | ICD-10-CM | POA: Diagnosis not present

## 2022-02-13 DIAGNOSIS — K922 Gastrointestinal hemorrhage, unspecified: Secondary | ICD-10-CM | POA: Diagnosis not present

## 2022-02-13 DIAGNOSIS — D500 Iron deficiency anemia secondary to blood loss (chronic): Secondary | ICD-10-CM | POA: Diagnosis not present

## 2022-02-13 LAB — GLUCOSE, CAPILLARY
Glucose-Capillary: 132 mg/dL — ABNORMAL HIGH (ref 70–99)
Glucose-Capillary: 216 mg/dL — ABNORMAL HIGH (ref 70–99)
Glucose-Capillary: 290 mg/dL — ABNORMAL HIGH (ref 70–99)
Glucose-Capillary: 269 mg/dL — ABNORMAL HIGH (ref 70–99)

## 2022-02-13 LAB — CBC WITH DIFFERENTIAL/PLATELET
Abs Immature Granulocytes: 0.01 10*3/uL (ref 0.00–0.07)
Basophils Absolute: 0 10*3/uL (ref 0.0–0.1)
Basophils Relative: 1 %
Eosinophils Absolute: 0.1 10*3/uL (ref 0.0–0.5)
Eosinophils Relative: 3 %
HCT: 28.6 % — ABNORMAL LOW (ref 39.0–52.0)
Hemoglobin: 9.7 g/dL — ABNORMAL LOW (ref 13.0–17.0)
Immature Granulocytes: 0 %
Lymphocytes Relative: 21 %
Lymphs Abs: 0.6 10*3/uL — ABNORMAL LOW (ref 0.7–4.0)
MCH: 34.2 pg — ABNORMAL HIGH (ref 26.0–34.0)
MCHC: 33.9 g/dL (ref 30.0–36.0)
MCV: 100.7 fL — ABNORMAL HIGH (ref 80.0–100.0)
Monocytes Absolute: 0.5 10*3/uL (ref 0.1–1.0)
Monocytes Relative: 17 %
Neutro Abs: 1.6 10*3/uL — ABNORMAL LOW (ref 1.7–7.7)
Neutrophils Relative %: 58 %
Platelets: 117 10*3/uL — ABNORMAL LOW (ref 150–400)
RBC: 2.84 MIL/uL — ABNORMAL LOW (ref 4.22–5.81)
RDW: 21 % — ABNORMAL HIGH (ref 11.5–15.5)
WBC: 2.8 10*3/uL — ABNORMAL LOW (ref 4.0–10.5)
nRBC: 0 % (ref 0.0–0.2)

## 2022-02-13 LAB — ALBUMIN, PLEURAL OR PERITONEAL FLUID: Albumin, Fluid: <1.5 g/dL

## 2022-02-13 LAB — COMPREHENSIVE METABOLIC PANEL
ALT: 33 U/L (ref 0–44)
AST: 35 U/L (ref 15–41)
Albumin: 2.6 g/dL — ABNORMAL LOW (ref 3.5–5.0)
Alkaline Phosphatase: 200 U/L — ABNORMAL HIGH (ref 38–126)
Anion gap: 4 — ABNORMAL LOW (ref 5–15)
BUN: 10 mg/dL (ref 8–23)
CO2: 22 mmol/L (ref 22–32)
Calcium: 8.2 mg/dL — ABNORMAL LOW (ref 8.9–10.3)
Chloride: 113 mmol/L — ABNORMAL HIGH (ref 98–111)
Creatinine, Ser: 0.88 mg/dL (ref 0.61–1.24)
GFR, Estimated: >60 mL/min (ref >60)
Glucose, Bld: 144 mg/dL — ABNORMAL HIGH (ref 70–99)
Potassium: 3.3 mmol/L — ABNORMAL LOW (ref 3.5–5.1)
Sodium: 139 mmol/L (ref 135–145)
Total Bilirubin: 1.5 mg/dL — ABNORMAL HIGH (ref 0.3–1.2)
Total Protein: 4.8 g/dL — ABNORMAL LOW (ref 6.5–8.1)

## 2022-02-13 LAB — BODY FLUID CELL COUNT WITH DIFFERENTIAL
Eos, Fluid: 0 %
Lymphs, Fluid: 44 %
Monocyte-Macrophage-Serous Fluid: 54 % (ref 50–90)
Neutrophil Count, Fluid: 2 % (ref 0–25)
Total Nucleated Cell Count, Fluid: 104 cu mm (ref 0–1000)

## 2022-02-13 IMAGING — US US PARACENTESIS
1 series · 3 of 3 positions shown · non-contrast
Comparison: none

INDICATION: History of metastatic gastric adenocarcinoma with new onset ascites.
Patient was referred to IR for diagnostic and therapeutic
paracentesis.

[Series 1: us paracentesis mc & wl · 3 of 3 slices shown]
[im 1/3]
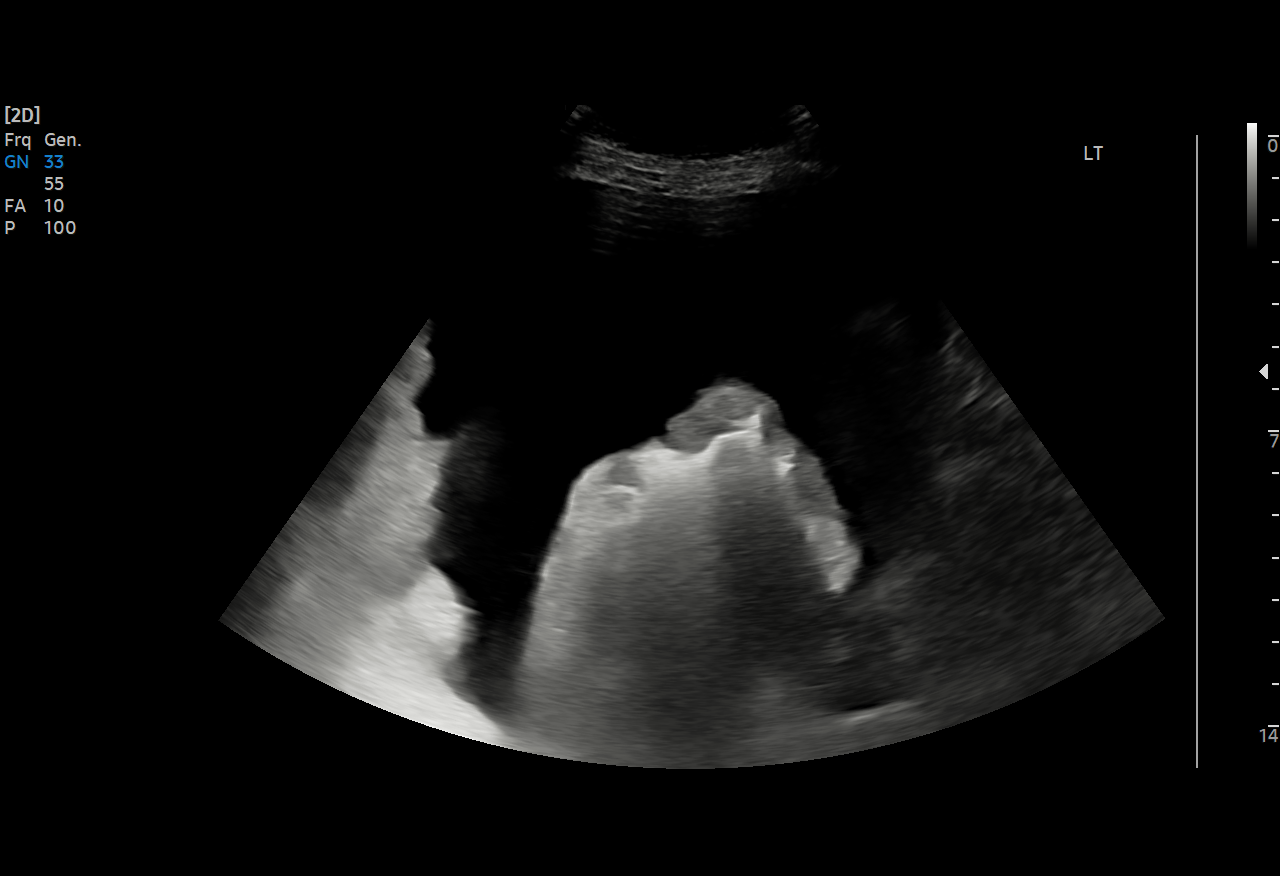
[im 2/3]
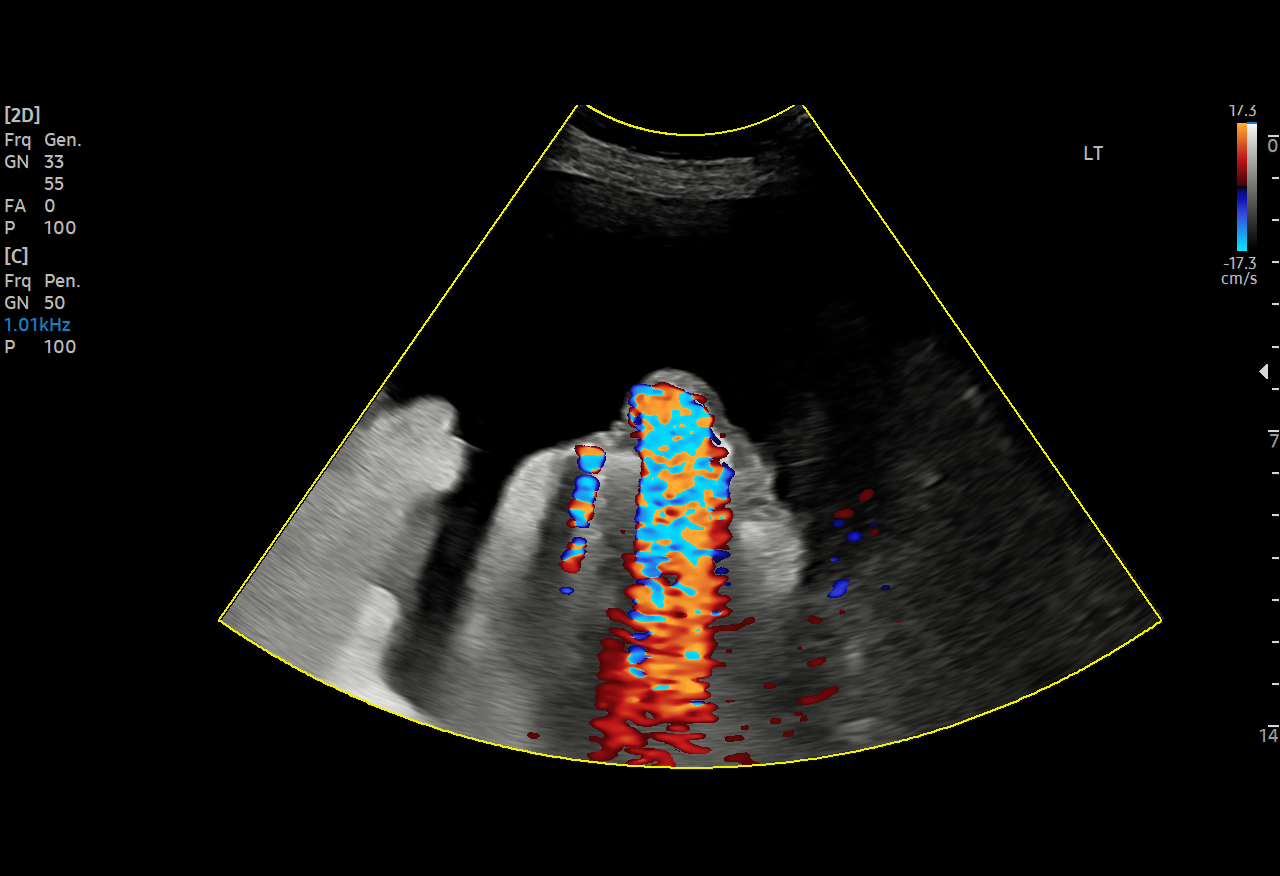
[im 3/3]
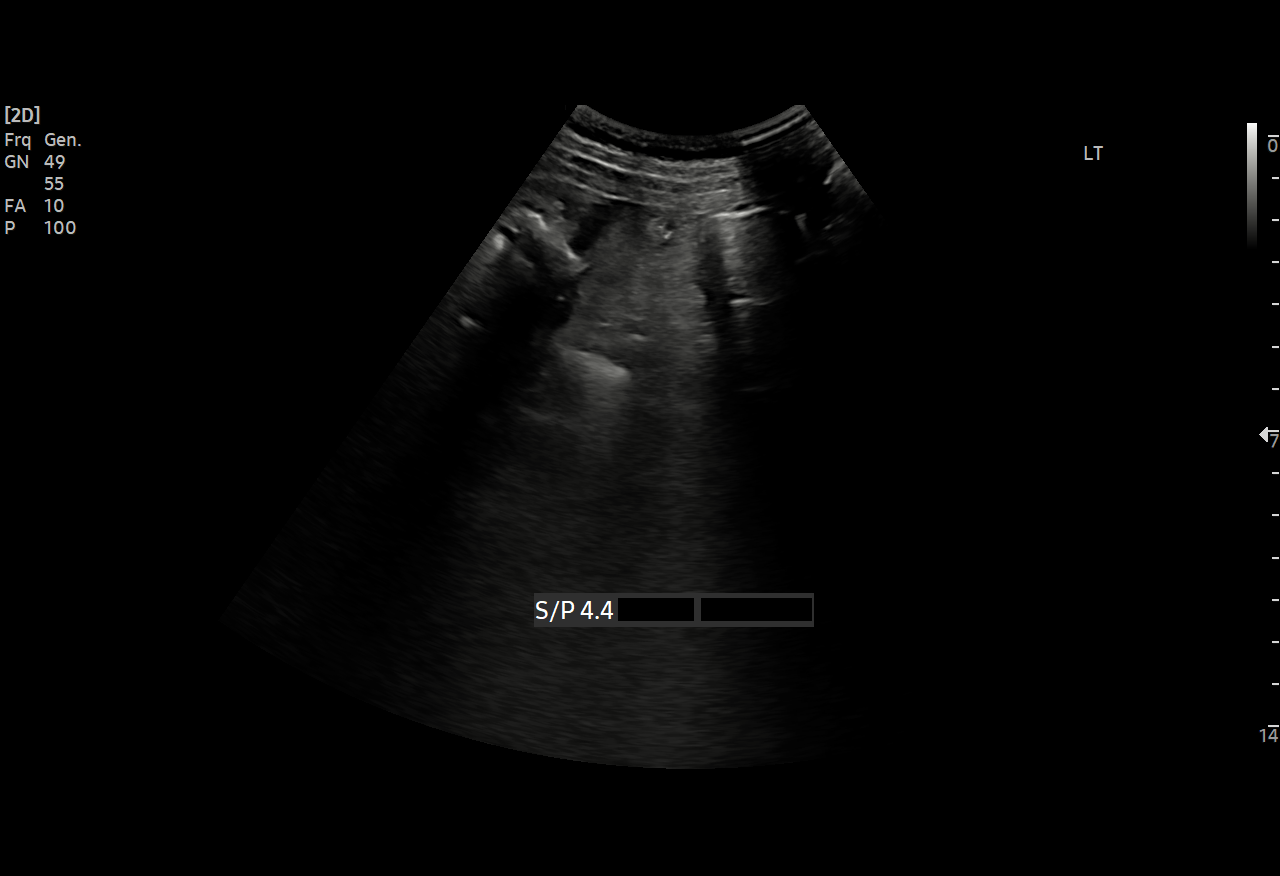

[3 of 3 positions shown; findings below may reference images not displayed]

EXAM:
ULTRASOUND GUIDED DIAGNOSTIC AND THERAPEUTIC RIGHT LOWER QUADRANT
PARACENTESIS

MEDICATIONS:
10 mL 1 % lidocaine

COMPLICATIONS:
None immediate.

PROCEDURE:
Informed written consent was obtained from the patient after a
discussion of the risks, benefits and alternatives to treatment. A
timeout was performed prior to the initiation of the procedure.

Initial ultrasound scanning demonstrates a large amount of ascites
within the right lower abdominal quadrant. The right lower abdomen
was prepped and draped in the usual sterile fashion. 1% lidocaine
was used for local anesthesia.

Following this, a 19 gauge, 7-cm, Yueh catheter was introduced. An
ultrasound image was saved for documentation purposes. The
paracentesis was performed. The catheter was removed and a dressing
was applied. The patient tolerated the procedure well without
immediate post procedural complication.
FINDINGS: A total of approximately 4.4 L of clear, yellow fluid was removed.
Samples were sent to the laboratory as requested by the clinical
team.
IMPRESSION: Successful ultrasound-guided paracentesis yielding 4.4 liters of
peritoneal fluid.

## 2022-02-13 MED ORDER — POTASSIUM CHLORIDE CRYS ER 20 MEQ PO TBCR
40.0000 meq | EXTENDED_RELEASE_TABLET | Freq: Once | ORAL | Status: AC
  Administered 2022-02-13: 40 meq via ORAL
  Filled 2022-02-13: qty 2

## 2022-02-13 MED ORDER — LIDOCAINE HCL 1 % IJ SOLN
INTRAMUSCULAR | Status: AC
  Administered 2022-02-13: 15 mL
  Filled 2022-02-13: qty 20

## 2022-02-13 MED ORDER — MAGNESIUM SULFATE 2 GM/50ML IV SOLN
2.0000 g | Freq: Once | INTRAVENOUS | Status: AC
  Administered 2022-02-13: 2 g via INTRAVENOUS
  Filled 2022-02-13: qty 50

## 2022-02-13 NOTE — Procedures (Signed)
PROCEDURE SUMMARY:  Successful US guided diagnostic and therapeutic paracentesis from RLQ.  Yielded 4.4 L of clear, yellow fluid.  No immediate complications.  Pt tolerated well.   Specimen was sent for labs.  EBL < 1 mL  Tyson Alias, AGNP 02/13/2022 3:35 PM

## 2022-02-13 NOTE — Progress Notes (Signed)
I saw Jimmy Blankenship this morning.  He is doing pretty well.His white cell count is 2.8.  Platelet count 117,000.  His hemoglobin is 9.7.  His bilirubin is 1.5.  I talked him through an interpreter about radiation.  I think that he would benefit from radiation.  Even though there is no obvious bleeding, I suspect that there probably is an occasional bleeding from this mass.  I explained about radiation.  I spoke with Dr. Sondra Come of Radiation Oncology yesterday.  Hopefully they will see Jimmy Blankenship.  He looks pretty stable.  I do think he is eating.  He is having no problems with melena.  He is having no diarrhea.  Hopefully, he will be able to go home in a day or so.  Again, radiation can be done as an outpatient.  I suspect he probably will require 4 weeks of radiation.  I probably will use some low-dose Xeloda with the radiation.  I think he and his wife both understood what we needed to do.  The interpreter was very helpful.  Lattie Haw, MD  Romans 1:16

## 2022-02-13 NOTE — Progress Notes (Addendum)
PROGRESS NOTE    Jimmy Blankenship  ASN:[id number] DOB: 09-10-1951 DOA: 02/08/2022 PCP: [name], MD    Brief Narrative:  Patient is a 71 year old [place] male with past medical history significant for gastric cancer mets, GI bleeding/anemia, CAD status post CABG, CHF, diabetes, hyperlipidemia, hypertension, BPH, CKD, LV thrombus presenting with GI bleeding and symptomatic anemia.  Patient has undergone EGD (see documentation).    U/s showed ascites.  Paracentesis ordered.  Radiation to be arrange outpatient.  Home in AM.   Assessment and Plan: GI bleed/Symptomatic anemia > Patient presenting with GI bleeding and symptomatic anemia in the setting of gastric cancer as below. >s/p 2 units  -h/h stable -s/p EGD: No active bleeding on this study and no lesions                            amenable to endoscopic intervention. Suspect the                            anemia and melena is from intermittent malignant                            oozing. If concern for acute rebleeding, would                            consider either IR or Radiation Oncology                            consultation.   Gastric cancer > Known history of gastric cancer with liver mets and recurrent GI bleeding and symptomatic anemia. > Follows with Dr. Marin Olp.  Oncology has seen patient.   -GI team is also seen patient.  Patient has undergone EGD. -oncology to consult rad onc to arrange outpatient   Ascites -paracentesis ordered with labs  CAD > Status post CABG - Continue home atorvastatin, metoprolol  CHF > Last echo in 2020 with EF 40-45%, G1 DD, moderately reduced RV function > Not currently on any diuretics - Continue home metoprolol  Diabetes - SSI  Hyperlipidemia - Continue home atorvastatin  Hypertension > Low normal blood pressure in the ED - Continue home metoprolol  BPH - Continue home tamsulosin  CKD > Creatinine near baseline at 1.2  Abdominal distention: -U/S pending to r/o  ascites    DVT prophylaxis: SCDs Start: 02/08/22 1847    Code Status: DNR   Disposition Plan:  Level of care: Telemetry Status is: inpt    Consultants:  GI Oncology IR for paracentesis    Subjective: No current complaints  Objective: Vitals:   02/12/22 2123 02/13/22 0430 02/13/22 0500 02/13/22 0843  BP: 108/62 110/64  121/79  Pulse: 87 99  92  Resp: 18 18    Temp: 98.3 F (36.8 C) 98.4 F (36.9 C)    TempSrc: Oral Oral    SpO2: 98% 100%    Weight:   70.6 kg     Intake/Output Summary (Last 24 hours) at 02/13/2022 1011 Last data filed at 02/12/2022 1700 Gross per 24 hour  Intake 75 ml  Output --  Net 75 ml   Filed Weights   02/11/22 0537 02/12/22 0504 02/13/22 0500  Weight: 69 kg 69.5 kg 70.6 kg    Examination:  General: Appearance:  Well developed, well nourished male in no acute distress   Abd distended,   Lungs:      respirations unlabored  Heart:    Normal heart rate. Normal rhythm. No murmurs, rubs, or gallops.   MS:   All extremities are intact.   Neurologic:   Awake, alert      Data Reviewed: I have personally reviewed following labs and imaging studies  CBC: Recent Labs  Lab 02/07/22 1012 02/08/22 1553 02/09/22 0524 02/10/22 0111 02/11/22 0551 02/12/22 1132 02/13/22 0500  WBC 2.9*   < > 2.5* 2.2* 2.1* 2.9* 2.8*  NEUTROABS 1.9  --   --  1.2* 1.2* 1.7 1.6*  HGB 8.7*   < > 10.6* 10.0* 9.3* 9.8* 9.7*  HCT 25.6*   < > 31.0* 29.2* 27.0* 28.9* 28.6*  MCV 101.2*   < > 97.5 98.6 98.5 99.3 100.7*  PLT 127*   < > 88* 94* 99* 114* 117*   < > = values in this interval not displayed.   Basic Metabolic Panel: Recent Labs  Lab 02/08/22 1553 02/09/22 0524 02/10/22 0111 02/11/22 0551 02/12/22 1131 02/12/22 1132 02/13/22 0500  NA 141   < > 140 141 140 141 139  K 3.3*   < > 3.0* 3.6 3.6 3.6 3.3*  CL 112*   < > 114* 116* 114* 115* 113*  CO2 18*   < > 20* 21* 21* 22 22  GLUCOSE 144*   < > 170* 182* 136* 141* 144*  BUN 14   < > '15 12 9 10 10   '$ CREATININE 1.21   < > 0.87 0.94 0.88 0.82 0.88  CALCIUM 8.6*   < > 8.1* 8.3* 8.2* 8.2* 8.2*  MG 1.4*  --  1.4* 1.8  --  1.6*  --   PHOS  --   --   --  1.7* 3.0  --   --    < > = values in this interval not displayed.   GFR: Estimated Creatinine Clearance: 79.1 mL/min (by C-G formula based on SCr of 0.88 mg/dL). Liver Function Tests: Recent Labs  Lab 02/09/22 0524 02/10/22 0111 02/11/22 0551 02/12/22 1131 02/12/22 1132 02/13/22 0500  AST 38 37 35  --  38 35  ALT 36 34 33  --  34 33  ALKPHOS 204* 200* 191*  --  207* 200*  BILITOT 2.2* 1.7* 1.2  --  1.6* 1.5*  PROT 5.1* 4.7* 4.7*  --  4.8* 4.8*  ALBUMIN 2.6* 2.6* 2.4* 2.5* 2.5* 2.6*   No results for input(s): LIPASE, AMYLASE in the last 168 hours. No results for input(s): AMMONIA in the last 168 hours. Coagulation Profile: No results for input(s): INR, PROTIME in the last 168 hours. Cardiac Enzymes: No results for input(s): CKTOTAL, CKMB, CKMBINDEX, TROPONINI in the last 168 hours. BNP (last 3 results) No results for input(s): PROBNP in the last 8760 hours. HbA1C: No results for input(s): HGBA1C in the last 72 hours. CBG: Recent Labs  Lab 02/12/22 0717 02/12/22 1234 02/12/22 1635 02/12/22 2125 02/13/22 0753  GLUCAP 169* 142* 259* 236* 132*   Lipid Profile: No results for input(s): CHOL, HDL, LDLCALC, TRIG, CHOLHDL, LDLDIRECT in the last 72 hours. Thyroid Function Tests: No results for input(s): TSH, T4TOTAL, FREET4, T3FREE, THYROIDAB in the last 72 hours. Anemia Panel: No results for input(s): VITAMINB12, FOLATE, FERRITIN, TIBC, IRON, RETICCTPCT in the last 72 hours. Sepsis Labs: No results for input(s): PROCALCITON, LATICACIDVEN in the last 168 hours.  No results  found for this or any previous visit (from the past 240 hour(s)).       Radiology Studies: DG Abd 1 View  Result Date: 02/11/2022 CLINICAL DATA:  Abdominal distension EXAM: ABDOMEN - 1 VIEW COMPARISON:  February 20, 2021 FINDINGS: Evaluation is  limited due to supine imaging. Within this limitation, no free air, portal venous gas, or pneumatosis is identified. The stomach is air-filled and mildly prominent. Otherwise, the bowel gas pattern is nonobstructive. Large and small bowel loops are normal in caliber. No other acute abnormalities. IMPRESSION: Large and small bowel loops are normal in caliber. The stomach contains air and is mildly prominent. No other abnormalities. Electronically Signed   By: Dorise Bullion III M.D.   On: 02/11/2022 12:42   DG Abd Portable 1V  Result Date: 02/12/2022 CLINICAL DATA:  [id number]; follow-up ileus EXAM: PORTABLE ABDOMEN - 1 VIEW COMPARISON:  Radiograph dated February 11, 2022 FINDINGS: Air-filled nondilated loops of bowel. Incomplete assessment of the pelvis and lung bases. Degenerative changes of the lower lumbar spine. IMPRESSION: Nonobstructive bowel gas pattern. Electronically Signed   By: Valentino Saxon M.D.   On: 02/12/2022 08:53   [place] ASCITES (ABDOMEN LIMITED)  Result Date: 02/12/2022 CLINICAL DATA:  Ascites.  Gastric cancer. EXAM: LIMITED ABDOMEN ULTRASOUND FOR ASCITES TECHNIQUE: Limited ultrasound survey for ascites was performed in all four abdominal quadrants. COMPARISON:  CT abdomen 12/22/2021 FINDINGS: There is a moderate to large amount of ascites in the abdomen. This represents a substantial increase compared to 12/22/2021. IMPRESSION: 1. Moderate to large amount of ascites, increased from April 2023. Electronically Signed   By: Van Clines M.D.   On: 02/12/2022 20:05        Scheduled Meds:  atorvastatin  80 mg Oral Daily   Chlorhexidine Gluconate Cloth  6 each Topical Daily   docusate sodium  100 mg Oral BID   feeding supplement  237 mL Oral BID BM   gabapentin  300 mg Oral TID   insulin aspart  0-9 Units Subcutaneous TID WC   megestrol  400 mg Oral BID   metoprolol tartrate  12.5 mg Oral BID   pantoprazole  40 mg Oral BID   sodium chloride flush  10-40 mL Intracatheter Q12H    tamsulosin  0.4 mg Oral QHS   Continuous Infusions:    LOS: 4 days    Geradine Girt, DO [facility] Available via Epic secure chat 7am-7pm After these hours, please refer to coverage provider listed on [email or web address] 02/13/2022, 10:11 AM

## 2022-02-13 NOTE — TOC Initial Note (Signed)
Transition of Care [facility]) - Initial/Assessment Note    Patient Details  Name: Jimmy Blankenship MRN: [id number] Date of Birth: 1951/02/28  [facility] CM/SW Contact:    Lynnell Catalan, RN Phone Number: 02/13/2022, 3:39 PM  Clinical Narrative:                 From home with spouse. TOC will follow along for dc needs.  Expected Discharge Plan: Home/Self Care Barriers to Discharge: Continued Medical Work up  Expected Discharge Plan and Services Expected Discharge Plan: Home/Self Care   Discharge Planning Services: CM Consult   Living arrangements for the past 2 months: Single Family Home                   Prior Living Arrangements/Services Living arrangements for the past 2 months: Single Family Home Lives with:: Spouse Patient language and need for interpreter reviewed:: Yes        Need for Family Participation in Patient Care: Yes (Comment) Care giver support system in place?: Yes (comment)   Criminal Activity/Legal Involvement Pertinent to Current Situation/Hospitalization: No - Comment as needed  Activities of Daily Living Home Assistive Devices/Equipment: None ADL Screening (condition at time of admission) Patient's cognitive ability adequate to safely complete daily activities?: Yes Is the patient deaf or have difficulty hearing?: No Does the patient have difficulty seeing, even when wearing glasses/contacts?: No Does the patient have difficulty concentrating, remembering, or making decisions?: No Patient able to express need for assistance with ADLs?: Yes (non-english speaking) Does the patient have difficulty dressing or bathing?: Yes Independently performs ADLs?: No Communication: Needs assistance Is this a change from baseline?: Pre-admission baseline Dressing (OT): Needs assistance Is this a change from baseline?: Pre-admission baseline Grooming: Needs assistance Is this a change from baseline?: Pre-admission baseline Feeding: Independent Bathing:  Needs assistance Is this a change from baseline?: Pre-admission baseline Toileting: Needs assistance Is this a change from baseline?: Pre-admission baseline In/Out Bed: Needs assistance Is this a change from baseline?: Pre-admission baseline Walks in Home: Needs assistance Is this a change from baseline?: Pre-admission baseline Does the patient have difficulty walking or climbing stairs?: Yes Weakness of Legs: Both Weakness of Arms/Hands: None  Permission Sought/Granted                  Emotional Assessment Appearance:: Appears stated age     Orientation: : Oriented to Self, Oriented to Place, Oriented to  Time, Oriented to Situation Alcohol / Substance Use: Not Applicable Psych Involvement: No (comment)  Admission diagnosis:  GI bleed [K92.2] Gastrointestinal hemorrhage, unspecified gastrointestinal hemorrhage type [K92.2] Fatigue, unspecified type [R53.83] Patient Active Problem List   Diagnosis Date Noted   Gastritis and gastroduodenitis    Constipation    Bloating    GI bleed 02/08/2022   Acute upper gastrointestinal bleeding 12/28/2021   Chronic combined systolic and diastolic congestive heart failure ([place]) 12/28/2021   Mixed diabetic hyperlipidemia associated with type 2 diabetes mellitus ([place]) 12/28/2021   BPH (benign prostatic hyperplasia) 12/28/2021   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[place]) 08/22/2020   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 ([place]) 08/22/2020   Goals of care, counseling/discussion 08/22/2020   Iron deficiency anemia due to chronic blood loss    Benign neoplasm of transverse colon    Benign neoplasm of sigmoid colon    Upper GI bleeding 08/13/2020   DOE (dyspnea on exertion) 08/12/2020   S/P CABG x 3 03/24/2020   Chest  pain 03/18/2020   Nonspecific chest pain 03/16/2020   Coronary artery disease involving native coronary artery of native heart without angina pectoris 03/16/2020   Controlled type 2  diabetes mellitus with diabetic polyneuropathy, with long-term current use of insulin ([place]) 03/16/2020   Essential hypertension 03/16/2020   HLD (hyperlipidemia) 03/16/2020   AKI (acute kidney injury) ([place]) 03/16/2020   PCP:  [name], MD Pharmacy:   [id number] - [place], [address] [place] [address] Phone: [phone] Fax: [phone]     Social Determinants of Health (SDOH) Interventions    Readmission Risk Interventions    02/13/2022    3:37 PM 03/30/2020   11:56 AM  Readmission Risk Prevention Plan  Transportation Screening Complete Complete  PCP or Specialist Appt within 5-7 Days  Complete  PCP or Specialist Appt within 3-5 Days Complete   Home Care Screening  Complete  Medication Review (RN CM)  Complete  HRI or Home Care Consult Complete   Social Work Consult for [place] Planning/Counseling Complete   Palliative Care Screening Not Applicable   Medication Review Press photographer) Complete

## 2022-02-14 ENCOUNTER — Other Ambulatory Visit: Payer: Medicare Other

## 2022-02-14 ENCOUNTER — Ambulatory Visit: Payer: Medicare Other | Admitting: Family

## 2022-02-14 ENCOUNTER — Ambulatory Visit: Payer: Medicare Other

## 2022-02-14 LAB — CBC
HCT: 29.9 % — ABNORMAL LOW (ref 39.0–52.0)
Hemoglobin: 9.8 g/dL — ABNORMAL LOW (ref 13.0–17.0)
MCH: 33.3 pg (ref 26.0–34.0)
MCHC: 32.8 g/dL (ref 30.0–36.0)
MCV: 101.7 fL — ABNORMAL HIGH (ref 80.0–100.0)
Platelets: 121 10*3/uL — ABNORMAL LOW (ref 150–400)
RBC: 2.94 MIL/uL — ABNORMAL LOW (ref 4.22–5.81)
RDW: 21.1 % — ABNORMAL HIGH (ref 11.5–15.5)
WBC: 2.7 10*3/uL — ABNORMAL LOW (ref 4.0–10.5)
nRBC: 0 % (ref 0.0–0.2)

## 2022-02-14 LAB — BASIC METABOLIC PANEL
Anion gap: 4 — ABNORMAL LOW (ref 5–15)
BUN: 9 mg/dL (ref 8–23)
CO2: 21 mmol/L — ABNORMAL LOW (ref 22–32)
Calcium: 8.2 mg/dL — ABNORMAL LOW (ref 8.9–10.3)
Chloride: 115 mmol/L — ABNORMAL HIGH (ref 98–111)
Creatinine, Ser: 1 mg/dL (ref 0.61–1.24)
GFR, Estimated: >60 mL/min (ref >60)
Glucose, Bld: 187 mg/dL — ABNORMAL HIGH (ref 70–99)
Potassium: 3.7 mmol/L (ref 3.5–5.1)
Sodium: 140 mmol/L (ref 135–145)

## 2022-02-14 LAB — GLUCOSE, CAPILLARY
Glucose-Capillary: 167 mg/dL — ABNORMAL HIGH (ref 70–99)
Glucose-Capillary: 277 mg/dL — ABNORMAL HIGH (ref 70–99)

## 2022-02-14 LAB — SURGICAL PATHOLOGY

## 2022-02-14 MED ORDER — DOCUSATE SODIUM 100 MG PO CAPS: 100.0000 mg | ORAL_CAPSULE | Freq: Two times a day (BID) | ORAL | 0 refills | Status: DC

## 2022-02-14 MED ORDER — FUROSEMIDE 20 MG PO TABS: 20.0000 mg | ORAL_TABLET | Freq: Every day | ORAL | 11 refills | Status: DC | PRN

## 2022-02-14 MED ORDER — POLYETHYLENE GLYCOL 3350 17 G PO PACK: 17.0000 g | PACK | Freq: Every day | ORAL | Status: DC | PRN

## 2022-02-14 MED ORDER — METOPROLOL TARTRATE 25 MG PO TABS: 12.5000 mg | ORAL_TABLET | Freq: Two times a day (BID) | ORAL | 0 refills | Status: AC

## 2022-02-14 MED ORDER — HEPARIN SOD (PORK) LOCK FLUSH 100 UNIT/ML IV SOLN
500.0000 [IU] | Freq: Once | INTRAVENOUS | Status: DC
  Filled 2022-02-14: qty 5

## 2022-02-14 MED ORDER — POLYETHYLENE GLYCOL 3350 17 G PO PACK: 17.0000 g | PACK | Freq: Every day | ORAL | 0 refills | Status: DC | PRN

## 2022-02-14 MED ORDER — BISACODYL 10 MG RE SUPP: 10.0000 mg | RECTAL | 0 refills | Status: DC | PRN

## 2022-02-14 NOTE — Discharge Summary (Signed)
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)   Iron deficiency anemia due to chronic blood loss   Coronary artery disease involving native coronary artery of native heart without angina pectoris   Chronic combined systolic and diastolic congestive heart failure (HCC)   Controlled type 2 diabetes mellitus with diabetic polyneuropathy, with long-term current use of insulin (HCC)   Mixed diabetic hyperlipidemia associated with type 2 diabetes mellitus ([place])   Essential hypertension   BPH (benign prostatic hyperplasia)   S/P CABG x 3   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  EGD was performed and did not show any etiology for the bleeding. The patient received 2 units of packed red blood cells for his symptomatic anemia and subsequently is improved. For his gastric cancer with liver mets, the patient has been following with Dr. Marin Olp and there are plans to arrange radiation as the cancer may be causing his bleeding.  Principal Problem:   GI bleed-suspected to be upper GI bleed -See above Active Problems:    Coronary artery disease involving native coronary artery of native heart without angina  pectoris -Has history of CABG - Continue metoprolol and atorvastatin    Chronic combined systolic and diastolic congestive heart failure (HCC) -EF 40 to 45% with grade 1 diastolic dysfunction and moderately reduced RV function on last echo was performed in 2020 - Stable off of diuretics   DM2- not uncontrolled - he had not been using insulin or any other of his meds as his appetite is poor Hemoglobin A1C    Component Value Date/Time   HGBA1C 7.0 (H) 12/29/2021 0550     Discharge Instructions  Discharge Instructions     Diet Carb Modified   Complete by: As directed    Increase activity slowly   Complete by: As directed       Allergies as of 02/14/2022   Not on File      Medication List     STOP taking these medications    glipiZIDE 5 MG tablet Commonly known as: GLUCOTROL   insulin glargine 100 unit/mL Sopn Commonly known as: LANTUS   lactulose 10 GM/15ML solution Commonly known as: CHRONULAC   metFORMIN 1000 MG tablet Commonly known as: GLUCOPHAGE       TAKE these medications    atorvastatin 40 MG tablet Commonly known as: LIPITOR Take 80 mg by mouth daily.   bisacodyl 10 MG suppository Commonly known as: Dulcolax Place 1 suppository (10 mg total) rectally as needed for moderate constipation.   dexamethasone 4 MG tablet Commonly known as: DECADRON Take 2 tablets (8 mg total) by mouth daily. Start the day after chemotherapy for 2 days.   diphenhydrAMINE 25 MG tablet Commonly known as: BENADRYL Take 25 mg by  mouth at bedtime as needed for itching.   diphenoxylate-atropine 2.5-0.025 MG tablet Commonly known as: LOMOTIL TAKE ONE TABLET BY MOUTH FOUR TIMES A DAY AS NEEDED FOR DIARRHEA OR LOOSE STOOLS What changed:  how much to take how to take this when to take this reasons to take this additional instructions   docusate sodium 100 MG capsule Commonly known as: COLACE Take 1 capsule (100 mg total) by mouth 2 (two) times daily.   feeding  supplement Liqd Take 237 mLs by mouth 2 (two) times daily between meals. What changed: when to take this   furosemide 20 MG tablet Commonly known as: Lasix Take 1 tablet (20 mg total) by mouth daily as needed (for abdominal swelling (ascites)). Only take if you have treated constipation first.   gabapentin 300 MG capsule Commonly known as: NEURONTIN Take 1 capsule (300 mg total) by mouth 4 (four) times daily.   lidocaine-prilocaine cream Commonly known as: EMLA Apply to affected area once What changed:  how much to take when to take this reasons to take this additional instructions   LORazepam 0.5 MG tablet Commonly known as: Ativan Take 1 tablet (0.5 mg total) by mouth every 6 (six) hours as needed (Nausea or vomiting).   megestrol 400 MG/10ML suspension Commonly known as: MEGACE Take 10 mLs (400 mg total) by mouth 2 (two) times daily. What changed:  when to take this reasons to take this   melatonin 5 MG Tabs Take 5 mg by mouth at bedtime.   metoprolol tartrate 25 MG tablet Commonly known as: LOPRESSOR Take 0.5 tablets (12.5 mg total) by mouth 2 (two) times daily. What changed:  medication strength how much to take   ondansetron 8 MG tablet Commonly known as: Zofran Take 1 tablet (8 mg total) by mouth 2 (two) times daily as needed for refractory nausea / vomiting. Start on day 3 after chemo.   pantoprazole 40 MG tablet Commonly known as: PROTONIX Take 1 tablet (40 mg total) by mouth 2 (two) times daily. Follow up with gastroenterology for additional instructions   polyethylene glycol 17 g packet Commonly known as: MIRALAX / GLYCOLAX Take 17 g by mouth daily as needed for mild constipation.   prochlorperazine 10 MG tablet Commonly known as: COMPAZINE Take 1 tablet (10 mg total) by mouth every 6 (six) hours as needed (Nausea or vomiting). What changed: reasons to take this   sucralfate 1 g tablet Commonly known as: CARAFATE Take 1 tablet (1 g total) by  mouth 4 (four) times daily -  with meals and at bedtime for 14 days.   tamsulosin 0.4 MG Caps capsule Commonly known as: FLOMAX Take 0.4 mg by mouth at bedtime.            The results of significant diagnostics from this hospitalization (including imaging, microbiology, ancillary and laboratory) are listed below for reference.    US Paracentesis  Result Date: 02/13/2022 INDICATION: History of metastatic gastric adenocarcinoma with new onset ascites. Patient was referred to IR for diagnostic and therapeutic paracentesis. EXAM: ULTRASOUND GUIDED DIAGNOSTIC AND THERAPEUTIC RIGHT LOWER QUADRANT PARACENTESIS MEDICATIONS: 10 mL 1 % lidocaine COMPLICATIONS: None immediate. PROCEDURE: Informed written consent was obtained from the patient after a discussion of the risks, benefits and alternatives to treatment. A timeout was performed prior to the initiation of the procedure. Initial ultrasound scanning demonstrates a large amount of ascites within the right lower abdominal quadrant. The right lower abdomen was prepped and draped in the usual sterile fashion. 1%  lidocaine was used for local anesthesia. Following this, a 19 gauge, 7-cm, Yueh catheter was introduced. An ultrasound image was saved for documentation purposes. The paracentesis was performed. The catheter was removed and a dressing was applied. The patient tolerated the procedure well without immediate post procedural complication. FINDINGS: A total of approximately 4.4 L of clear, yellow fluid was removed. Samples were sent to the laboratory as requested by the clinical team. IMPRESSION: Successful ultrasound-guided paracentesis yielding 4.4 liters of peritoneal fluid. Read by: Narda Rutherford, AGNP-BC Electronically Signed   By: Michaelle Birks M.D.   On: 02/13/2022 17:29   [place] ASCITES (ABDOMEN LIMITED)  Result Date: 02/12/2022 CLINICAL DATA:  Ascites.  Gastric cancer. EXAM: LIMITED ABDOMEN ULTRASOUND FOR ASCITES TECHNIQUE: Limited ultrasound survey  for ascites was performed in all four abdominal quadrants. COMPARISON:  CT abdomen 12/22/2021 FINDINGS: There is a moderate to large amount of ascites in the abdomen. This represents a substantial increase compared to 12/22/2021. IMPRESSION: 1. Moderate to large amount of ascites, increased from April 2023. Electronically Signed   By: Van Clines M.D.   On: 02/12/2022 20:05   DG Abd Portable 1V  Result Date: 02/12/2022 CLINICAL DATA:  [id number]; follow-up ileus EXAM: PORTABLE ABDOMEN - 1 VIEW COMPARISON:  Radiograph dated February 11, 2022 FINDINGS: Air-filled nondilated loops of bowel. Incomplete assessment of the pelvis and lung bases. Degenerative changes of the lower lumbar spine. IMPRESSION: Nonobstructive bowel gas pattern. Electronically Signed   By: Valentino Saxon M.D.   On: 02/12/2022 08:53   DG Abd 1 View  Result Date: 02/11/2022 CLINICAL DATA:  Abdominal distension EXAM: ABDOMEN - 1 VIEW COMPARISON:  February 20, 2021 FINDINGS: Evaluation is limited due to supine imaging. Within this limitation, no free air, portal venous gas, or pneumatosis is identified. The stomach is air-filled and mildly prominent. Otherwise, the bowel gas pattern is nonobstructive. Large and small bowel loops are normal in caliber. No other acute abnormalities. IMPRESSION: Large and small bowel loops are normal in caliber. The stomach contains air and is mildly prominent. No other abnormalities. Electronically Signed   By: Dorise Bullion III M.D.   On: 02/11/2022 12:42   ECHOCARDIOGRAM COMPLETE  Result Date: 02/10/2022    ECHOCARDIOGRAM REPORT   Patient Name:   Corderius HO Driskill Date of Exam: 02/09/2022 Medical Rec #:  [id number]     Height:       69.0 in Accession #:    [id number]    Weight:       147.0 lb Date of Birth:  [birth year]     BSA:          1.813 m Patient Age:    72 years      BP:           99/61 mmHg Patient Gender: M             HR:           105 bpm. Exam Location:  Inpatient Procedure: 2D Echo Indications:    Chemo.  History of intracardiac thrombus.  History:        Patient has prior history of Echocardiogram examinations, most                 recent 08/28/2021. CAD, Cancer; Risk Factors:Dyslipidemia,                 Hypertension and Diabetes.  Sonographer:    Joette Catching Referring Phys: [place]  1. No left ventricular thrombus  is seen with Definity contrast. Left ventricular ejection fraction, by estimation, is 35 to 40%. The left ventricle has moderately decreased function. The left ventricle demonstrates regional wall motion abnormalities (see scoring diagram/findings for description). Left ventricular diastolic parameters are consistent with Grade I diastolic dysfunction (impaired relaxation). There is dyskinesis of the entire left ventricular apex.  2. Right ventricular systolic function is normal. The right ventricular size is normal. There is normal pulmonary artery systolic pressure.  3. Left atrial size was severely dilated.  4. The mitral valve is normal in structure. Moderate mitral valve regurgitation.  5. The aortic valve is tricuspid. There is mild calcification of the aortic valve. There is mild thickening of the aortic valve. Aortic valve regurgitation is not visualized. Aortic valve sclerosis is present, with no evidence of aortic valve stenosis.  6. The inferior vena cava is dilated in size with >50% respiratory variability, suggesting right atrial pressure of 8 mmHg. Comparison(s): No significant change from prior study. Prior images reviewed side by side. FINDINGS  Left Ventricle: No left ventricular thrombus is seen with Definity contrast. Left ventricular ejection fraction, by estimation, is 35 to 40%. The left ventricle has moderately decreased function. The left ventricle demonstrates regional wall motion abnormalities. Definity contrast agent was given IV to delineate the left ventricular endocardial borders. The left ventricular internal cavity size was normal in size.  There is no left ventricular hypertrophy. Left ventricular diastolic parameters are consistent with Grade I diastolic dysfunction (impaired relaxation). Normal left ventricular filling pressure.  LV Wall Scoring: The entire apex is dyskinetic. Right Ventricle: The right ventricular size is normal. No increase in right ventricular wall thickness. Right ventricular systolic function is normal. There is normal pulmonary artery systolic pressure. The tricuspid regurgitant velocity is 2.19 m/s, and  with an assumed right atrial pressure of 3 mmHg, the estimated right ventricular systolic pressure is 16.9 mmHg. Left Atrium: Left atrial size was severely dilated. Right Atrium: Right atrial size was normal in size. Pericardium: There is no evidence of pericardial effusion. Mitral Valve: The mitral valve is normal in structure. Moderate mitral valve regurgitation, with eccentric posteriorly directed jet. Tricuspid Valve: The tricuspid valve is normal in structure. Tricuspid valve regurgitation is mild. Aortic Valve: The aortic valve is tricuspid. There is mild calcification of the aortic valve. There is mild thickening of the aortic valve. Aortic valve regurgitation is not visualized. Aortic valve sclerosis is present, with no evidence of aortic valve stenosis. Aortic valve mean gradient measures 4.0 mmHg. Aortic valve peak gradient measures 6.1 mmHg. Aortic valve area, by VTI measures 3.86 cm. Pulmonic Valve: The pulmonic valve was normal in structure. Pulmonic valve regurgitation is not visualized. Aorta: The aortic root is normal in size and structure. Venous: The inferior vena cava is dilated in size with greater than 50% respiratory variability, suggesting right atrial pressure of 8 mmHg. IAS/Shunts: No atrial level shunt detected by color flow Doppler. Additional Comments: A venous catheter is visualized in the right atrium. There is pleural effusion in the left lateral region.  LEFT VENTRICLE PLAX 2D LVIDd:          4.00 cm      Diastology LVIDs:         3.40 cm      LV e' lateral:   13.40 cm/s LV PW:         1.10 cm      LV E/e' lateral: 3.9 LV IVS:        1.10 cm LVOT  diam:     2.20 cm LV SV:         81 LV SV Index:   45 LVOT Area:     3.80 cm  LV Volumes (MOD) LV vol d, MOD A2C: 114.0 ml LV vol d, MOD A4C: 123.0 ml LV vol s, MOD A2C: 63.9 ml LV vol s, MOD A4C: 85.8 ml LV SV MOD A2C:     50.1 ml LV SV MOD A4C:     123.0 ml LV SV MOD BP:      46.7 ml RIGHT VENTRICLE            IVC RV Basal diam:  2.40 cm    IVC diam: 2.00 cm RV Mid diam:    1.90 cm RV S prime:     8.27 cm/s TAPSE (M-mode): 0.6 cm LEFT ATRIUM             Index        RIGHT ATRIUM           Index LA diam:        4.90 cm 2.70 cm/m   RA Area:     16.40 cm LA Vol (A2C):   48.8 ml 26.92 ml/m  RA Volume:   35.20 ml  19.42 ml/m LA Vol (A4C):   52.3 ml 28.85 ml/m LA Biplane Vol: 51.7 ml 28.52 ml/m  AORTIC VALVE                     PULMONIC VALVE AV Area (Vmax):    3.83 cm      PV Vmax:       0.99 m/s AV Area (Vmean):   3.25 cm      PV Peak grad:  3.9 mmHg AV Area (VTI):     3.86 cm AV Vmax:           123.00 cm/s AV Vmean:          104.000 cm/s AV VTI:            0.211 m AV Peak Grad:      6.1 mmHg AV Mean Grad:      4.0 mmHg LVOT Vmax:         124.00 cm/s LVOT Vmean:        88.900 cm/s LVOT VTI:          0.214 m LVOT/AV VTI ratio: 1.01  AORTA Ao Root diam: 3.50 cm MITRAL VALVE                  TRICUSPID VALVE MV Area (PHT): 9.85 cm       TR Peak grad:   19.2 mmHg MV Decel Time: 77 msec        TR Vmax:        219.00 cm/s MR Peak grad:    106.5 mmHg MR Mean grad:    65.0 mmHg    SHUNTS MR Vmax:         516.00 cm/s  Systemic VTI:  0.21 m MR Vmean:        387.0 cm/s   Systemic Diam: 2.20 cm MR PISA:         0.57 cm MR PISA Eff ROA: 4 mm MR PISA Radius:  0.30 cm MV E velocity: 52.00 cm/s MV A velocity: 114.00 cm/s MV E/A ratio:  0.46 Mihai Croitoru MD Electronically signed by Sanda Klein MD Signature Date/Time: 02/10/2022/10:06:18 AM    Final    Labs:  Basic Metabolic Panel: Recent Labs  Lab 02/10/22 0111 02/11/22 0551 02/12/22 1131 02/12/22 1132 02/13/22 0500 02/14/22 0500  NA 140 141 140 141 139 140  K 3.0* 3.6 3.6 3.6 3.3* 3.7  CL 114* 116* 114* 115* 113* 115*  CO2 20* 21* 21* 22 22 21*  GLUCOSE 170* 182* 136* 141* 144* 187*  BUN '15 12 9 10 10 9  '$ CREATININE 0.87 0.94 0.88 0.82 0.88 1.00  CALCIUM 8.1* 8.3* 8.2* 8.2* 8.2* 8.2*  MG 1.4* 1.8  --  1.6*  --   --   PHOS  --  1.7* 3.0  --   --   --      CBC: Recent Labs  Lab 02/10/22 0111 02/11/22 0551 02/12/22 1132 02/13/22 0500 02/14/22 0500  WBC 2.2* 2.1* 2.9* 2.8* 2.7*  NEUTROABS 1.2* 1.2* 1.7 1.6*  --   HGB 10.0* 9.3* 9.8* 9.7* 9.8*  HCT 29.2* 27.0* 28.9* 28.6* 29.9*  MCV 98.6 98.5 99.3 100.7* 101.7*  PLT 94* 99* 114* 117* 121*         SIGNED:   Debbe Odea, MD  [facility] 02/16/2022, 3:58 PM

## 2022-02-14 NOTE — Care Management Important Message (Signed)
Important Message  Patient Details IM Letter placed in Patients room. Name: Jimmy Blankenship MRN: [id number] Date of Birth: 1951/08/31   Medicare Important Message Given:  Yes     Kerin Salen 02/14/2022, 10:05 AM

## 2022-02-15 ENCOUNTER — Other Ambulatory Visit: Payer: Medicare Other

## 2022-02-15 ENCOUNTER — Ambulatory Visit: Payer: Medicare Other

## 2022-02-15 ENCOUNTER — Ambulatory Visit: Payer: Medicare Other | Admitting: Hematology & Oncology

## 2022-02-15 LAB — GRAM STAIN

## 2022-02-16 LAB — CYTOLOGY - NON PAP

## 2022-02-16 NOTE — Progress Notes (Signed)
GI Location of Tumor / Histology: Metastatic adenocarcinoma of the stomach  Biopsies revealed:    FINAL MICROSCOPIC DIAGNOSIS:   A. COLON, TRANSVERSE/SIGMOID, POLYPECTOMY:  - Tubular adenoma.  - No high-grade dysplasia or carcinoma.  - Hamartomatous polyp.   B. STOMACH, BODY POSTERIOR WALL, BIOPSY:  - Adenocarcinoma with signet ring cells.  - See comment.   Past/Anticipated interventions by medical oncology, if any: low-dose Xeloda with the radiation  Weight changes, if any: {:18581}  Bowel/Bladder complaints, if any: {:18581}  Nausea / Vomiting, if any: {:18581}  Pain issues, if any:  {:18581}  Any blood per rectum:   {:18581}  SAFETY ISSUES: Prior radiation? {:18581} Pacemaker/ICD?  Possible current pregnancy? no Is the patient on methotrexate? {:18581}  Current Complaints/Details:

## 2022-02-18 ENCOUNTER — Encounter: Payer: Self-pay | Admitting: Hematology & Oncology

## 2022-02-18 LAB — CULTURE, BODY FLUID W GRAM STAIN -BOTTLE: Culture: NO GROWTH

## 2022-02-19 ENCOUNTER — Encounter: Payer: Self-pay | Admitting: *Deleted

## 2022-02-19 ENCOUNTER — Other Ambulatory Visit: Payer: Self-pay | Admitting: *Deleted

## 2022-02-19 DIAGNOSIS — C165 Malignant neoplasm of lesser curvature of stomach, unspecified: Secondary | ICD-10-CM

## 2022-02-20 ENCOUNTER — Inpatient Hospital Stay: Payer: Medicare Other | Admitting: Hematology & Oncology

## 2022-02-20 ENCOUNTER — Other Ambulatory Visit (HOSPITAL_COMMUNITY): Payer: Self-pay

## 2022-02-20 ENCOUNTER — Other Ambulatory Visit: Payer: Self-pay | Admitting: Oncology

## 2022-02-20 ENCOUNTER — Telehealth: Payer: Self-pay | Admitting: Pharmacist

## 2022-02-20 ENCOUNTER — Ambulatory Visit: Payer: Medicare Other

## 2022-02-20 ENCOUNTER — Encounter: Payer: Self-pay | Admitting: Hematology & Oncology

## 2022-02-20 ENCOUNTER — Other Ambulatory Visit (HOSPITAL_BASED_OUTPATIENT_CLINIC_OR_DEPARTMENT_OTHER): Payer: Medicare Other | Admitting: Hematology & Oncology

## 2022-02-20 ENCOUNTER — Inpatient Hospital Stay: Payer: Medicare Other

## 2022-02-20 ENCOUNTER — Telehealth: Payer: Self-pay | Admitting: Pharmacy Technician

## 2022-02-20 VITALS — BP 102/68 | HR 97 | Temp 98.4°F | Resp 20 | Wt 152.0 lb

## 2022-02-20 DIAGNOSIS — C799 Secondary malignant neoplasm of unspecified site: Secondary | ICD-10-CM

## 2022-02-20 DIAGNOSIS — C165 Malignant neoplasm of lesser curvature of stomach, unspecified: Secondary | ICD-10-CM | POA: Insufficient documentation

## 2022-02-20 DIAGNOSIS — Z79899 Other long term (current) drug therapy: Secondary | ICD-10-CM | POA: Insufficient documentation

## 2022-02-20 DIAGNOSIS — D500 Iron deficiency anemia secondary to blood loss (chronic): Secondary | ICD-10-CM | POA: Insufficient documentation

## 2022-02-20 DIAGNOSIS — Z5112 Encounter for antineoplastic immunotherapy: Secondary | ICD-10-CM | POA: Insufficient documentation

## 2022-02-20 DIAGNOSIS — C169 Malignant neoplasm of stomach, unspecified: Secondary | ICD-10-CM | POA: Diagnosis not present

## 2022-02-20 DIAGNOSIS — K922 Gastrointestinal hemorrhage, unspecified: Secondary | ICD-10-CM | POA: Insufficient documentation

## 2022-02-20 LAB — COMPREHENSIVE METABOLIC PANEL
ALT: 49 U/L — ABNORMAL HIGH (ref 0–44)
AST: 72 U/L — ABNORMAL HIGH (ref 15–41)
Albumin: 3.2 g/dL — ABNORMAL LOW (ref 3.5–5.0)
Alkaline Phosphatase: 206 U/L — ABNORMAL HIGH (ref 38–126)
Anion gap: 9 (ref 5–15)
BUN: 17 mg/dL (ref 8–23)
CO2: 22 mmol/L (ref 22–32)
Calcium: 9.1 mg/dL (ref 8.9–10.3)
Chloride: 110 mmol/L (ref 98–111)
Creatinine, Ser: 1.13 mg/dL (ref 0.61–1.24)
GFR, Estimated: >60 mL/min (ref >60)
Glucose, Bld: 214 mg/dL — ABNORMAL HIGH (ref 70–99)
Potassium: 4.2 mmol/L (ref 3.5–5.1)
Sodium: 141 mmol/L (ref 135–145)
Total Bilirubin: 1.5 mg/dL — ABNORMAL HIGH (ref 0.3–1.2)
Total Protein: 5.1 g/dL — ABNORMAL LOW (ref 6.5–8.1)

## 2022-02-20 LAB — CBC WITH DIFFERENTIAL (CANCER CENTER ONLY)
Abs Immature Granulocytes: 0.05 10*3/uL (ref 0.00–0.07)
Basophils Absolute: 0 10*3/uL (ref 0.0–0.1)
Basophils Relative: 1 %
Eosinophils Absolute: 0.1 10*3/uL (ref 0.0–0.5)
Eosinophils Relative: 1 %
HCT: 35.4 % — ABNORMAL LOW (ref 39.0–52.0)
Hemoglobin: 11.6 g/dL — ABNORMAL LOW (ref 13.0–17.0)
Immature Granulocytes: 1 %
Lymphocytes Relative: 13 %
Lymphs Abs: 0.7 10*3/uL (ref 0.7–4.0)
MCH: 34.3 pg — ABNORMAL HIGH (ref 26.0–34.0)
MCHC: 32.8 g/dL (ref 30.0–36.0)
MCV: 104.7 fL — ABNORMAL HIGH (ref 80.0–100.0)
Monocytes Absolute: 0.7 10*3/uL (ref 0.1–1.0)
Monocytes Relative: 14 %
Neutro Abs: 3.8 10*3/uL (ref 1.7–7.7)
Neutrophils Relative %: 70 %
Platelet Count: 115 10*3/uL — ABNORMAL LOW (ref 150–400)
RBC: 3.38 MIL/uL — ABNORMAL LOW (ref 4.22–5.81)
RDW: 22.2 % — ABNORMAL HIGH (ref 11.5–15.5)
WBC Count: 5.3 10*3/uL (ref 4.0–10.5)
nRBC: 0.4 % — ABNORMAL HIGH (ref 0.0–0.2)

## 2022-02-20 LAB — SAMPLE TO BLOOD BANK

## 2022-02-20 LAB — IRON AND IRON BINDING CAPACITY (CC-WL,HP ONLY)
Iron: 76 ug/dL (ref 45–182)
Saturation Ratios: 28 % (ref 17.9–39.5)
TIBC: 276 ug/dL (ref 250–450)
UIBC: 200 ug/dL (ref 117–376)

## 2022-02-20 LAB — LACTATE DEHYDROGENASE: LDH: 268 U/L — ABNORMAL HIGH (ref 98–192)

## 2022-02-20 LAB — FERRITIN: Ferritin: 1897 ng/mL — ABNORMAL HIGH (ref 24–336)

## 2022-02-20 MED ORDER — CAPECITABINE 500 MG PO TABS
1500.0000 mg | ORAL_TABLET | Freq: Every day | ORAL | 0 refills | Status: DC
  Filled 2022-02-20: qty 42, 14d supply, fill #0

## 2022-02-20 NOTE — Telephone Encounter (Signed)
Oral Oncology Pharmacist Encounter  Received new prescription for Xeloda (capecitabine) for the treatment of metastatic gastric cancer in conjunction with radiation, planned for duration of radiation therapy.  CMP and CBC w/ Diff from 02/20/22 assessed, patient with Scr 1.13 mg/dL (CrCl ~60.1 mL/min). Noted patient with elevated T. Bili at 1.5 mg/dL, AST 72 U/L, ALT 49 U/L and alk phos 206 U/L - no baseline dose adjustments for hepatic impairment required. Dosing frequency of Xeloda per Dr. Marin Olp.   Current medication list in Epic reviewed, DDIs with Xeloda identified: DDI between Xeloda and Pantoprazole - there is mixed data around proton-pump inhibitors potentially decreasing efficacy of Xeloda - given patient was just hospitalized for GI bleed, would not recommend switching to alternative agent at this time.   Evaluated chart and no patient barriers to medication adherence noted.   Prescription has been e-scribed to the [facility] for benefits analysis and approval.  Oral Oncology Clinic will continue to follow for insurance authorization, copayment issues, initial counseling and start date.  Leron Croak, PharmD, BCPS Hematology/Oncology Clinical Pharmacist [facility] [place] [phone] 02/20/2022 1:51 PM

## 2022-02-20 NOTE — Progress Notes (Signed)
Hematology and Oncology Follow Up Visit  Jimmy Blankenship [id number] 08-05-1951 71 y.o. 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  He just got out of the hospital.  He has been having GI bleeding.  He has had upper endoscopy.  He has a gastric mass.  He had an upper endoscopy on 02/10/2022.  He was found to have a mass in the stomach.  Biopsies showed there is to be high-grade dysplasia.  No invasive malignancy was also noted.  A problem that he has now is that he has recurrent ascites.  I am not sure as to why he has the ascites.  He had ascites taken out last week.  Thankfully, the pathology on the fluid was negative for malignancy.  I have to worry that he has metastatic disease that is progressing.  He just looks a little bit weaker.  He looks little bit more feeble.  He looks like he is lost some weight.  His abdomen is quite distended.  I do believe that he has ascites coming back.  We will have to give him another paracentesis and give him some albumin with this.  He has had no problems with pain.  He has not noted any obvious melena or bright red blood per rectum.  There is no cough.  He has had no headache.  He had a little bit of leg swelling.  Overall, his performance status is ECOG 2.   Medications:  Allergies as of 02/20/2022   Not on File      Medication List         Accurate as of February 20, 2022  1:27 PM. If you have any questions, ask your nurse or doctor.          atorvastatin 40 MG tablet Commonly known as: LIPITOR Take 80 mg by mouth daily.   bisacodyl 10 MG suppository Commonly known as: Dulcolax Place 1 suppository (10 mg total) rectally as needed for moderate constipation.   capecitabine 500 MG tablet Commonly known as: XELODA Take 3 tablets (1,500 mg total) by mouth daily after breakfast. Started by: Jimmy Napoleon, MD   dexamethasone 4 MG tablet Commonly known as: DECADRON Take 2 tablets (8 mg total) by mouth daily. Start the day after chemotherapy for 2 days.   diphenhydrAMINE 25 MG tablet Commonly known as: BENADRYL Take 25 mg by mouth at bedtime as needed for itching.   diphenoxylate-atropine 2.5-0.025 MG tablet Commonly known as: LOMOTIL TAKE ONE TABLET BY MOUTH FOUR TIMES A DAY AS NEEDED FOR DIARRHEA OR LOOSE STOOLS What changed:  how much to take how to take this when to take this reasons to take this additional instructions   docusate sodium 100 MG capsule Commonly known as: COLACE Take 1 capsule (100 mg total) by mouth 2 (two) times daily.   feeding supplement Liqd Take 237 mLs  by mouth 2 (two) times daily between meals. What changed: when to take this   furosemide 20 MG tablet Commonly known as: Lasix Take 1 tablet (20 mg total) by mouth daily as needed (for abdominal swelling (ascites)). Only take if you have treated constipation first.   gabapentin 300 MG capsule Commonly known as: NEURONTIN Take 1 capsule (300 mg total) by mouth 4 (four) times daily.   insulin glargine 100 UNIT/ML Solostar Pen Commonly known as: LANTUS Inject into the skin.   lidocaine-prilocaine cream Commonly known as: EMLA Apply to affected area once What changed:  how much to take when to take this reasons to take this additional instructions   LORazepam 0.5 MG tablet Commonly known as: Ativan Take 1 tablet (0.5 mg  total) by mouth every 6 (six) hours as needed (Nausea or vomiting).   megestrol 400 MG/10ML suspension Commonly known as: MEGACE Take 10 mLs (400 mg total) by mouth 2 (two) times daily. What changed:  when to take this reasons to take this   melatonin 5 MG Tabs Take 5 mg by mouth at bedtime.   metoprolol tartrate 25 MG tablet Commonly known as: LOPRESSOR Take 0.5 tablets (12.5 mg total) by mouth 2 (two) times daily.   ondansetron 8 MG tablet Commonly known as: Zofran Take 1 tablet (8 mg total) by mouth 2 (two) times daily as needed for refractory nausea / vomiting. Start on day 3 after chemo.   pantoprazole 40 MG tablet Commonly known as: PROTONIX Take 1 tablet (40 mg total) by mouth 2 (two) times daily. Follow up with gastroenterology for additional instructions   polyethylene glycol 17 g packet Commonly known as: MIRALAX / GLYCOLAX Take 17 g by mouth daily as needed for mild constipation.   prochlorperazine 10 MG tablet Commonly known as: COMPAZINE Take 1 tablet (10 mg total) by mouth every 6 (six) hours as needed (Nausea or vomiting). What changed: reasons to take this   sucralfate 1 g tablet Commonly known as: CARAFATE Take 1 tablet (1 g total) by mouth 4 (four) times daily -  with meals and at bedtime for 14 days.   tamsulosin 0.4 MG Caps capsule Commonly known as: FLOMAX Take 0.4 mg by mouth at bedtime.        Allergies: Not on File  Past Medical History, Surgical history, Social history, and Family History were reviewed and updated.  Review of Systems: Review of Systems  Constitutional:  Positive for malaise/fatigue and weight loss.  HENT: Negative.    Eyes: Negative.   Respiratory:  Positive for shortness of breath.   Cardiovascular: Negative.   Gastrointestinal:  Positive for abdominal pain, heartburn and melena.  Genitourinary: Negative.   Musculoskeletal: Negative.   Skin: Negative.   Neurological: Negative.   Endo/Heme/Allergies: Negative.    Psychiatric/Behavioral: Negative.       Physical Exam:  vitals were not taken for this visit.   Wt Readings from Last 3 Encounters:  02/20/22 152 lb (68.9 kg)  02/13/22 155 lb 10.3 oz (70.6 kg)  01/24/22 147 lb (66.7 kg)   His vital signs are temperature of 99 pulse 98.  Blood pressure is 102/68.  His weight is 152 pounds.  Physical Exam Vitals reviewed.  HENT:     Head: Normocephalic and atraumatic.  Eyes:     Pupils: Pupils are equal, round, and reactive to light.  Cardiovascular:     Rate and Rhythm: Normal rate and regular rhythm.     Heart sounds: Normal heart sounds.  Pulmonary:     Effort: Pulmonary effort is normal.     Breath sounds: Normal breath sounds.  Abdominal:     General: Bowel sounds are normal. There is distension.     Palpations: Abdomen is soft.  Musculoskeletal:        General: No tenderness or deformity. Normal range of motion.     Cervical back: Normal range of motion.  Lymphadenopathy:     Cervical: No cervical adenopathy.  Skin:    General: Skin is warm and dry.     Findings: No erythema or rash.  Neurological:     Mental Status: He is alert and oriented to person, place, and time.  Psychiatric:        Behavior: Behavior normal.        Thought Content: Thought content normal.        Judgment: Judgment normal.     Lab Results  Component Value Date   WBC 5.3 02/20/2022   HGB 11.6 (L) 02/20/2022   HCT 35.4 (L) 02/20/2022   MCV 104.7 (H) 02/20/2022   PLT 115 (L) 02/20/2022   Lab Results  Component Value Date   FERRITIN 1,707 (H) 01/24/2022   IRON 76 02/20/2022   TIBC 276 02/20/2022   UIBC 200 02/20/2022   IRONPCTSAT 28 02/20/2022   Lab Results  Component Value Date   RETICCTPCT 3.0 06/28/2021   RBC 3.38 (L) 02/20/2022   No results found for: "KPAFRELGTCHN", "LAMBDASER", "KAPLAMBRATIO" No results found for: "IGGSERUM", "IGA", "IGMSERUM" No results found for: "TOTALPROTELP", "ALBUMINELP", "A1GS", "A2GS", "BETS", "BETA2SER",  "GAMS", "MSPIKE", "SPEI"   Chemistry      Component Value Date/Time   NA 141 02/20/2022 0945   NA 140 06/10/2020 0935   K 4.2 02/20/2022 0945   CL 110 02/20/2022 0945   CO2 22 02/20/2022 0945   BUN 17 02/20/2022 0945   BUN 17 06/10/2020 0935   CREATININE 1.13 02/20/2022 0945   CREATININE 0.96 02/07/2022 1012      Component Value Date/Time   CALCIUM 9.1 02/20/2022 0945   ALKPHOS 206 (H) 02/20/2022 0945   AST 72 (H) 02/20/2022 0945   AST 37 02/07/2022 1012   ALT 49 (H) 02/20/2022 0945   ALT 36 02/07/2022 1012   BILITOT 1.5 (H) 02/20/2022 0945   BILITOT 1.5 (H) 02/07/2022 1012       Impression and Plan: Mr. Fugate is a very pleasant 71 yo [name] gentleman with metastatic adenocarcinoma of the stomach.   So far, he is done pretty well.  Again, I worry that he is progressing.  He just looks like he is progressing.  He will need a paracentesis.  We will try to set this up in 2 days.  He is going to have radiation therapy to this gastric mass.  Again I am surprised there is no obvious malignancy that is noted.  I have to believe that there is some malignancy in there.  I would like to get him on Xeloda.  I think low-dose Xeloda at 1500 mg a day would be reasonable with radiation therapy.  This may help the radiation.  We will continue him on the Enhertu.  I do not see a reason why he cannot continue this.  I will also have another CT scan on him next week and see how he looks with respect to progressive disease.  It would not surprise me if we would have to transfuse him at some point.  Again I am not sure why he  has the ascites.  Initial ascites was negative for malignancy.  I will make sure that the fluid that is taken off in 2 days we will have cytology sent.  Mr. Lapoint has done incredibly well for as long as he has had this cancer.  I does want him to have a better quality of life right now.  I know that he is trying to get back to [place] sometime in the late Summer or early  Fall.  I would like to see him back in a couple weeks or so so we can follow-up.   Jimmy Napoleon, MD 6/13/20231:27 PM

## 2022-02-20 NOTE — Patient Instructions (Signed)

## 2022-02-20 NOTE — Progress Notes (Signed)
Radiation Oncology         [phone] ________________________________  Initial Outpatient Consultation  Name: Jimmy Blankenship MRN: [id number]  Date: 02/21/2022  DOB: 02/12/1951  CC:[name], MD  Volanda Napoleon, MD   REFERRING PHYSICIAN: Volanda Napoleon, MD  DIAGNOSIS: There were no encounter diagnoses.  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 male who is accompanied by ***. he is seen as a courtesy of Dr. Marin Olp for an opinion concerning radiation therapy as part of management for his recently diagnosed ***.   In [place] of 2021, the patient was admitted for management of a GI bleed. Upper endoscopy performed at that time incidentally revealed a large mass in the stomach measuring 3 x 5 cm. This was biopsied with pathology showing adenocarcinoma with signet ring cells; Her2 positive. Polypectomy of the colon also performed at that time showed tubular adenoma. CT of the abdomen and pelvis performed while inpatient on 08/15/2020 additionally revealed 3-4 small hepatic lesions and a right kidney lesion worrisome for renal cell carcinoma.  Accordingly, the patient was referred to Dr. Marin Olp, and proceeded to undergo immunotherapy and chemotherapy consisitng of FOLFOX/Nivolumab/Herceptin on 09/19/20. Following cycle 4, he had a repeat CT of the chest abdomen and pelvis performed on 11/17/20 which showed a decrease in size of the gastric mass, measuring 7.7 x 5.3 cm, previously 8.2 x 6.4 cm.  Repeat CT of the chest abdomen and pelvis on 01/23/21 showed stable disease (s/p 8 cycles of systemic treatment). During a follow  up visit with Dr. Marin Olp on 01/25/21, the patient was recommended single agent enhertu given his Her2 positive disease and lack of further response to systemic treatment on imaging. The patient began enhertu on 02/01/21. Unfortunately, he developed significant diarrhea and lost his appetite completely shortly after starting enhertu, prompting Dr. Marin Olp to temporarily hold treatment on 02/21/21.  He was also prescribed Megace elixir to help with his appetite.   Echocardiogram on 03/01/21 showed an LV EF of 40-45% and left ventricular thrombus. Accordingly, Dr. Marin Olp started the patient on Xarelto, beginning on 03/02/21.   The patient was able to resume with cycle 2 of Enhertu on 03/07/21.   CT of the chest abdomen and pelvis on 05/16/21 showed stable tumor and hepatic metastatic disease findings. However, new interstitial infiltrates were noted in the lungs, which prompted Dr. Marin Olp to hold enhertu briefly.   CT of the chest abdomen and pelvis 08/09/21 showed a mild decrease in size of the gastric mass, and a stable to mild decrease in size of multifocal hepatic metastases. CT also showed a new nonspecific 5 mm nodular density within the periphery of the left lung base, likely infectious or inflammatory in etiology, and interval improvement of the previously noted multifocal areas of ground-glass attenuation.   CT of the chest abdomen and pelvis on 11/17/21 revealed a further decrease in size of the gastric mass, and interval improvement in the small sliver metastases. CT also showed new mild ascites and  new patchy airspace opacity in the superior segment of the right lower lobe, consistent with inflammatory or infectious etiology. No other new or  progressive disease was appreciated in the abdomen or pelvis.  CT of the abdomen and pelvis on 12/22/21 showed the gastric mass and hepatic metastases as stable in the interval . Trace ascites in the abdomen and pelvis also appeared unchanged.   The  patient presented to the ED on 12/28/21 on the advice of gastroenterology for evaluation of melena and weakness. Following admission, the patient underwent an EGD which revealed nodular mucosa in the gastric body, in the lesser curvature of the stomach and in the incisura, as well as a single duodenal polyp. Duodenal polypectomy performed showed no evidence of malignancy but did reveal findings consistent with peptic duodenitis.   Given his GI bleeding, the patient's xarelto was held but he was able to continue his enhertu infusions.   During a follow up visit with Dr. Marin Olp on 02/07/22, the patient endorsed abdominal fullness, lower energy, and decreased appetite. Given suspicions of recurrent GI bleed, Dr. Marin Olp performed a rectal exam which revealed dark heme positive stool. The patient was admitted again shortly after this visit on 02/08/22 for further management of GI bleeding and symptomatic anemia. He underwent an additional EGD and biopsies while inpatient on 02/322 which showed no etiology to explain his bleeding (detailed below). Hospital course included 2 units PRBCs for his symptomatic anemia with improvement.   - Stomach mass biopsy showed a small focus of high garde dysplasia and no evidence of invasive carcinoma.  - Biopsy of the stomach showed no evidence of malignancy with gastric oxyntic and oxyntomucinous mucosa; also negative for an  inflammatory pattern predictive of Helicobacter pylori infection.  Given suspicions that his GI bleeding may be due to his gastric cancer (despite pathology), Dr. Marin Olp referred the patient to me for consideration of radiation therapy.   Although no malignant findings were found, an abdominal ultrasound for ascites performed while inpatient on 02/12/22 showed a substantial increase in ascites compared to prior CT. Subsequently, the patient underwent paracentesis on 02/13/22 which yielded 4.4 liters of peritoneal fluid. Fluid sent for cytology showed  no malignant cells.   The patient currently remains on enhertu, and is scheduled for his next infusion on 02/22/22.   PREVIOUS RADIATION THERAPY: No  PAST MEDICAL HISTORY:  Past Medical History:  Diagnosis Date   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[place]) 08/22/2020   CKD (chronic kidney disease)    Coronary Artery Disease s/p CABG    hx of PCI in 2000s in [place] // s/p NSTEMI in 7/21 >> CABG   Diabetes mellitus 2    Goals of care, counseling/discussion 08/22/2020   Heart failure with reduced ejection fraction    Ischemic CM // Echo 7/21 - EF 35 // Intraop TEE 7/21: EF 40-45 // Echocardiogram 9/21: apical AK, EF 40-45, Gr 2 DD, trace AI, mild MR, mild LAE, normal RVSF, no pericardial effusion   High cholesterol    Hypertension    Iron deficiency anemia    Heme + stools (during admit for NSTEMI in 7/21)   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 ([place]) 08/22/2020   Palpitations    Event monitor 10/21: NSR, rare PVCs, rare 4 beats NSVT, rare brief non-sustained SVT    PAST SURGICAL HISTORY: Past Surgical History:  Procedure Laterality Date   BIOPSY  08/15/2020   Procedure: BIOPSY;  Surgeon: Ladene Artist, MD;  Location: [place];  Service: Gastroenterology;;   BIOPSY  12/29/2021   Procedure: BIOPSY;  Surgeon: Jackquline Denmark, MD;  Location:  [facility];  Service: Gastroenterology;;   BIOPSY  02/10/2022   Procedure: BIOPSY;  Surgeon: Lavena Bullion, DO;  Location: [facility];  Service: Gastroenterology;;   COLONOSCOPY     around 2013. Normal per patient. [place]   COLONOSCOPY N/A 08/15/2020   Procedure: COLONOSCOPY;  Surgeon: Ladene Artist, MD;  Location: [place];  Service: Gastroenterology;  Laterality: N/A;   CORONARY ANGIOPLASTY WITH STENT PLACEMENT     CORONARY ARTERY BYPASS GRAFT N/A 03/24/2020   Procedure: CORONARY ARTERY BYPASS GRAFTING (CABG), ON PUMP, TIMES THREE, USING LEFT INTERNAL MAMMARY ARTERY AND RIGHT  ENDOSCOPICALLY HARVESTED GREATER SAPHENOUS VEIN;  Surgeon: Ivin Poot, MD;  Location: [place];  Service: Open Heart Surgery;  Laterality: N/A;   ESOPHAGOGASTRODUODENOSCOPY N/A 08/15/2020   Procedure: ESOPHAGOGASTRODUODENOSCOPY (EGD);  Surgeon: Ladene Artist, MD;  Location: [place];  Service: Gastroenterology;  Laterality: N/A;   ESOPHAGOGASTRODUODENOSCOPY (EGD) WITH PROPOFOL N/A 12/29/2021   Procedure: ESOPHAGOGASTRODUODENOSCOPY (EGD) WITH PROPOFOL;  Surgeon: Jackquline Denmark, MD;  Location: [facility];  Service: Gastroenterology;  Laterality: N/A;   ESOPHAGOGASTRODUODENOSCOPY (EGD) WITH PROPOFOL N/A 02/10/2022   Procedure: ESOPHAGOGASTRODUODENOSCOPY (EGD) WITH PROPOFOL;  Surgeon: Lavena Bullion, DO;  Location: [facility];  Service: Gastroenterology;  Laterality: N/A;   IR IMAGING GUIDED PORT INSERTION  09/16/2020   LEFT HEART CATH AND CORONARY ANGIOGRAPHY N/A 03/17/2020   Procedure: LEFT HEART CATH AND CORONARY ANGIOGRAPHY;  Surgeon: Belva Crome, MD;  Location: [place] [facility];  Service: Cardiovascular;  Laterality: N/A;   POLYPECTOMY  08/15/2020   Procedure: POLYPECTOMY;  Surgeon: Ladene Artist, MD;  Location: [facility];  Service: Gastroenterology;;   TEE WITHOUT CARDIOVERSION N/A 03/24/2020   Procedure: TRANSESOPHAGEAL ECHOCARDIOGRAM (TEE);  Surgeon: [name], Collier Salina, MD;  Location: [place];  Service: Open Heart Surgery;  Laterality: N/A;    FAMILY HISTORY:  Family History  Problem Relation Age of Onset   Atrial fibrillation Mother    Congestive Heart Failure Mother    Cancer Neg Hx     SOCIAL HISTORY:  Social History   Tobacco Use   Smoking status: Former    Packs/day: 1.00    Years: 30.00    Total pack years: 30.00    Types: Cigarettes    Quit date: 2006    Years since quitting: 17.4   Smokeless tobacco: Never   Tobacco comments:    quit 2008  Vaping Use   Vaping Use: Never used  Substance Use Topics   Alcohol use: Never   Drug use: Never     ALLERGIES: Not on File  MEDICATIONS:  Current Outpatient Medications  Medication Sig Dispense Refill   atorvastatin (LIPITOR) 40 MG tablet Take 80 mg by mouth daily.     bisacodyl (DULCOLAX) 10 MG suppository Place 1 suppository (10 mg total) rectally as needed for moderate constipation. 12 suppository 0   capecitabine (XELODA) 500 MG tablet Take 3 tablets (1,500 mg total) by mouth daily after breakfast. 42 tablet 0   dexamethasone (DECADRON) 4 MG tablet Take 2 tablets (8 mg total) by mouth daily. Start the day after chemotherapy for 2 days. (Patient not taking: Reported on 02/20/2022) 30 tablet 1   diphenhydrAMINE (BENADRYL) 25 MG tablet Take 25 mg by mouth at bedtime as needed for itching.     diphenoxylate-atropine (LOMOTIL) 2.5-0.025 MG tablet TAKE ONE TABLET BY MOUTH FOUR TIMES A DAY AS NEEDED FOR DIARRHEA OR LOOSE STOOLS (Patient taking differently: Take 1 tablet by mouth 4 (four) times daily as needed  for diarrhea or loose stools.) 100 tablet 0   docusate sodium (COLACE) 100 MG capsule Take 1 capsule (100 mg total) by mouth 2 (two) times daily. 10 capsule 0   feeding supplement (ENSURE ENLIVE / ENSURE PLUS) LIQD Take 237 mLs by mouth 2 (two) times daily between meals. (Patient taking differently: Take 237 mLs by mouth daily.) 237 mL 12   furosemide (LASIX) 20 MG tablet Take 1 tablet (20 mg total) by mouth daily as needed (for abdominal swelling (ascites)). Only take if you have treated constipation first. 30 tablet 11   gabapentin (NEURONTIN) 300 MG capsule Take 1 capsule (300 mg total) by mouth 4 (four) times daily. 360 capsule 1   insulin glargine (LANTUS) 100 UNIT/ML Solostar Pen Inject into the skin.     lidocaine-prilocaine (EMLA) cream Apply to affected area once (Patient taking differently: 1 application  daily as needed (port access).) 30 g 3   LORazepam (ATIVAN) 0.5 MG tablet Take 1 tablet (0.5 mg total) by mouth every 6 (six) hours as needed (Nausea or vomiting). 30 tablet 0    megestrol (MEGACE) 400 MG/10ML suspension Take 10 mLs (400 mg total) by mouth 2 (two) times daily. (Patient taking differently: Take 400 mg by mouth 2 (two) times daily as needed (appetite).) 480 mL 4   melatonin 5 MG TABS Take 5 mg by mouth at bedtime.     metoprolol tartrate (LOPRESSOR) 25 MG tablet Take 0.5 tablets (12.5 mg total) by mouth 2 (two) times daily. 30 tablet 0   ondansetron (ZOFRAN) 8 MG tablet Take 1 tablet (8 mg total) by mouth 2 (two) times daily as needed for refractory nausea / vomiting. Start on day 3 after chemo. 30 tablet 1   pantoprazole (PROTONIX) 40 MG tablet Take 1 tablet (40 mg total) by mouth 2 (two) times daily. Follow up with gastroenterology for additional instructions 120 tablet 0   polyethylene glycol (MIRALAX / GLYCOLAX) 17 g packet Take 17 g by mouth daily as needed for mild constipation. 14 each 0   prochlorperazine (COMPAZINE) 10 MG tablet Take 1 tablet (10 mg total) by mouth every 6 (six) hours as needed (Nausea or vomiting). (Patient taking differently: Take 10 mg by mouth every 6 (six) hours as needed for nausea or vomiting (Nausea or vomiting).) 30 tablet 1   sucralfate (CARAFATE) 1 g tablet Take 1 tablet (1 g total) by mouth 4 (four) times daily -  with meals and at bedtime for 14 days. 56 tablet 0   tamsulosin (FLOMAX) 0.4 MG CAPS capsule Take 0.4 mg by mouth at bedtime.     No current facility-administered medications for this encounter.   Facility-Administered Medications Ordered in Other Encounters  Medication Dose Route Frequency Provider Last Rate Last Admin   sodium chloride flush (NS) 0.9 % injection 10 mL  10 mL Intravenous PRN Ennever, Rudell Cobb, MD        REVIEW OF SYSTEMS:  A 10+ POINT REVIEW OF SYSTEMS WAS OBTAINED including neurology, dermatology, psychiatry, cardiac, respiratory, lymph, extremities, GI, GU, musculoskeletal, constitutional, reproductive, HEENT. ***   PHYSICAL EXAM:  vitals were not taken for this visit.   General: Alert and  oriented, in no acute distress HEENT: Head is normocephalic. Extraocular movements are intact. Oropharynx is clear. Neck: Neck is supple, no palpable cervical or supraclavicular lymphadenopathy. Heart: Regular in rate and rhythm with no murmurs, rubs, or gallops. Chest: Clear to auscultation bilaterally, with no rhonchi, wheezes, or rales. Abdomen: Soft, nontender, nondistended, with no  rigidity or guarding. Extremities: No cyanosis or edema. Lymphatics: see Neck Exam Skin: No concerning lesions. Musculoskeletal: symmetric strength and muscle tone throughout. Neurologic: Cranial nerves II through XII are grossly intact. No obvious focalities. Speech is fluent. Coordination is intact. Psychiatric: Judgment and insight are intact. Affect is appropriate. ***  ECOG = ***  0 - Asymptomatic (Fully active, able to carry on all predisease activities without restriction)  1 - Symptomatic but completely ambulatory (Restricted in physically strenuous activity but ambulatory and able to carry out work of a light or sedentary nature. For example, light housework, office work)  2 - Symptomatic, <50% in bed during the day (Ambulatory and capable of all self care but unable to carry out any work activities. Up and about more than 50% of waking hours)  3 - Symptomatic, >50% in bed, but not bedbound (Capable of only limited self-care, confined to bed or chair 50% or more of waking hours)  4 - Bedbound (Completely disabled. Cannot carry on any self-care. Totally confined to bed or chair)  5 - Death   Eustace Pen MM, Creech RH, Tormey DC, et al. [phone]). "Toxicity and response criteria of the [facility]". [place] Oncol. 5 (6): 649-55  LABORATORY DATA:  Lab Results  Component Value Date   WBC 5.3 02/20/2022   HGB 11.6 (L) 02/20/2022   HCT 35.4 (L) 02/20/2022   MCV 104.7 (H) 02/20/2022   PLT 115 (L) 02/20/2022   NEUTROABS 3.8 02/20/2022   Lab Results  Component Value Date    NA 141 02/20/2022   K 4.2 02/20/2022   CL 110 02/20/2022   CO2 22 02/20/2022   GLUCOSE 214 (H) 02/20/2022   BUN 17 02/20/2022   CREATININE 1.13 02/20/2022   CALCIUM 9.1 02/20/2022      RADIOGRAPHY: US Paracentesis  Result Date: 02/13/2022 INDICATION: History of metastatic gastric adenocarcinoma with new onset ascites. Patient was referred to IR for diagnostic and therapeutic paracentesis. EXAM: ULTRASOUND GUIDED DIAGNOSTIC AND THERAPEUTIC RIGHT LOWER QUADRANT PARACENTESIS MEDICATIONS: 10 mL 1 % lidocaine COMPLICATIONS: None immediate. PROCEDURE: Informed written consent was obtained from the patient after a discussion of the risks, benefits and alternatives to treatment. A timeout was performed prior to the initiation of the procedure. Initial ultrasound scanning demonstrates a large amount of ascites within the right lower abdominal quadrant. The right lower abdomen was prepped and draped in the usual sterile fashion. 1% lidocaine was used for local anesthesia. Following this, a 19 gauge, 7-cm, Yueh catheter was introduced. An ultrasound image was saved for documentation purposes. The paracentesis was performed. The catheter was removed and a dressing was applied. The patient tolerated the procedure well without immediate post procedural complication. FINDINGS: A total of approximately 4.4 L of clear, yellow fluid was removed. Samples were sent to the laboratory as requested by the clinical team. IMPRESSION: Successful ultrasound-guided paracentesis yielding 4.4 liters of peritoneal fluid. Read by: Narda Rutherford, AGNP-BC Electronically Signed   By: Michaelle Birks M.D.   On: 02/13/2022 17:29   [place] ASCITES (ABDOMEN LIMITED)  Result Date: 02/12/2022 CLINICAL DATA:  Ascites.  Gastric cancer. EXAM: LIMITED ABDOMEN ULTRASOUND FOR ASCITES TECHNIQUE: Limited ultrasound survey for ascites was performed in all four abdominal quadrants. COMPARISON:  CT abdomen 12/22/2021 FINDINGS: There is a moderate to large amount  of ascites in the abdomen. This represents a substantial increase compared to 12/22/2021. IMPRESSION: 1. Moderate to large amount of ascites, increased from April 2023. Electronically Signed   By: Thayer Jew  Janeece Fitting M.D.   On: 02/12/2022 20:05   DG Abd Portable 1V  Result Date: 02/12/2022 CLINICAL DATA:  [id number]; follow-up ileus EXAM: PORTABLE ABDOMEN - 1 VIEW COMPARISON:  Radiograph dated February 11, 2022 FINDINGS: Air-filled nondilated loops of bowel. Incomplete assessment of the pelvis and lung bases. Degenerative changes of the lower lumbar spine. IMPRESSION: Nonobstructive bowel gas pattern. Electronically Signed   By: Valentino Saxon M.D.   On: 02/12/2022 08:53   DG Abd 1 View  Result Date: 02/11/2022 CLINICAL DATA:  Abdominal distension EXAM: ABDOMEN - 1 VIEW COMPARISON:  February 20, 2021 FINDINGS: Evaluation is limited due to supine imaging. Within this limitation, no free air, portal venous gas, or pneumatosis is identified. The stomach is air-filled and mildly prominent. Otherwise, the bowel gas pattern is nonobstructive. Large and small bowel loops are normal in caliber. No other acute abnormalities. IMPRESSION: Large and small bowel loops are normal in caliber. The stomach contains air and is mildly prominent. No other abnormalities. Electronically Signed   By: Dorise Bullion III M.D.   On: 02/11/2022 12:42   ECHOCARDIOGRAM COMPLETE  Result Date: 02/10/2022    ECHOCARDIOGRAM REPORT   Patient Name:   Ainsley HO Jilek Date of Exam: 02/09/2022 Medical Rec #:  [id number]     Height:       69.0 in Accession #:    [id number]    Weight:       147.0 lb Date of Birth:  23-Dec-1950     BSA:          1.813 m Patient Age:    89 years      BP:           99/61 mmHg Patient Gender: M             HR:           105 bpm. Exam Location:  Inpatient Procedure: 2D Echo Indications:    Chemo. History of intracardiac thrombus.  History:        Patient has prior history of Echocardiogram examinations, most                 recent  08/28/2021. CAD, Cancer; Risk Factors:Dyslipidemia,                 Hypertension and Diabetes.  Sonographer:    Joette Catching Referring Phys: [place]  1. No left ventricular thrombus is seen with Definity contrast. Left ventricular ejection fraction, by estimation, is 35 to 40%. The left ventricle has moderately decreased function. The left ventricle demonstrates regional wall motion abnormalities (see scoring diagram/findings for description). Left ventricular diastolic parameters are consistent with Grade I diastolic dysfunction (impaired relaxation). There is dyskinesis of the entire left ventricular apex.  2. Right ventricular systolic function is normal. The right ventricular size is normal. There is normal pulmonary artery systolic pressure.  3. Left atrial size was severely dilated.  4. The mitral valve is normal in structure. Moderate mitral valve regurgitation.  5. The aortic valve is tricuspid. There is mild calcification of the aortic valve. There is mild thickening of the aortic valve. Aortic valve regurgitation is not visualized. Aortic valve sclerosis is present, with no evidence of aortic valve stenosis.  6. The inferior vena cava is dilated in size with >50% respiratory variability, suggesting right atrial pressure of 8 mmHg. Comparison(s): No significant change from prior study. Prior images reviewed side by side. FINDINGS  Left Ventricle: No left ventricular thrombus is seen  with Definity contrast. Left ventricular ejection fraction, by estimation, is 35 to 40%. The left ventricle has moderately decreased function. The left ventricle demonstrates regional wall motion abnormalities. Definity contrast agent was given IV to delineate the left ventricular endocardial borders. The left ventricular internal cavity size was normal in size. There is no left ventricular hypertrophy. Left ventricular diastolic parameters are consistent with Grade I diastolic dysfunction (impaired  relaxation). Normal left ventricular filling pressure.  LV Wall Scoring: The entire apex is dyskinetic. Right Ventricle: The right ventricular size is normal. No increase in right ventricular wall thickness. Right ventricular systolic function is normal. There is normal pulmonary artery systolic pressure. The tricuspid regurgitant velocity is 2.19 m/s, and  with an assumed right atrial pressure of 3 mmHg, the estimated right ventricular systolic pressure is 66.5 mmHg. Left Atrium: Left atrial size was severely dilated. Right Atrium: Right atrial size was normal in size. Pericardium: There is no evidence of pericardial effusion. Mitral Valve: The mitral valve is normal in structure. Moderate mitral valve regurgitation, with eccentric posteriorly directed jet. Tricuspid Valve: The tricuspid valve is normal in structure. Tricuspid valve regurgitation is mild. Aortic Valve: The aortic valve is tricuspid. There is mild calcification of the aortic valve. There is mild thickening of the aortic valve. Aortic valve regurgitation is not visualized. Aortic valve sclerosis is present, with no evidence of aortic valve stenosis. Aortic valve mean gradient measures 4.0 mmHg. Aortic valve peak gradient measures 6.1 mmHg. Aortic valve area, by VTI measures 3.86 cm. Pulmonic Valve: The pulmonic valve was normal in structure. Pulmonic valve regurgitation is not visualized. Aorta: The aortic root is normal in size and structure. Venous: The inferior vena cava is dilated in size with greater than 50% respiratory variability, suggesting right atrial pressure of 8 mmHg. IAS/Shunts: No atrial level shunt detected by color flow Doppler. Additional Comments: A venous catheter is visualized in the right atrium. There is pleural effusion in the left lateral region.  LEFT VENTRICLE PLAX 2D LVIDd:         4.00 cm      Diastology LVIDs:         3.40 cm      LV e' lateral:   13.40 cm/s LV PW:         1.10 cm      LV E/e' lateral: 3.9 LV IVS:         1.10 cm LVOT diam:     2.20 cm LV SV:         81 LV SV Index:   45 LVOT Area:     3.80 cm  LV Volumes (MOD) LV vol d, MOD A2C: 114.0 ml LV vol d, MOD A4C: 123.0 ml LV vol s, MOD A2C: 63.9 ml LV vol s, MOD A4C: 85.8 ml LV SV MOD A2C:     50.1 ml LV SV MOD A4C:     123.0 ml LV SV MOD BP:      46.7 ml RIGHT VENTRICLE            IVC RV Basal diam:  2.40 cm    IVC diam: 2.00 cm RV Mid diam:    1.90 cm RV S prime:     8.27 cm/s TAPSE (M-mode): 0.6 cm LEFT ATRIUM             Index        RIGHT ATRIUM           Index LA diam:  4.90 cm 2.70 cm/m   RA Area:     16.40 cm LA Vol (A2C):   48.8 ml 26.92 ml/m  RA Volume:   35.20 ml  19.42 ml/m LA Vol (A4C):   52.3 ml 28.85 ml/m LA Biplane Vol: 51.7 ml 28.52 ml/m  AORTIC VALVE                     PULMONIC VALVE AV Area (Vmax):    3.83 cm      PV Vmax:       0.99 m/s AV Area (Vmean):   3.25 cm      PV Peak grad:  3.9 mmHg AV Area (VTI):     3.86 cm AV Vmax:           123.00 cm/s AV Vmean:          104.000 cm/s AV VTI:            0.211 m AV Peak Grad:      6.1 mmHg AV Mean Grad:      4.0 mmHg LVOT Vmax:         124.00 cm/s LVOT Vmean:        88.900 cm/s LVOT VTI:          0.214 m LVOT/AV VTI ratio: 1.01  AORTA Ao Root diam: 3.50 cm MITRAL VALVE                  TRICUSPID VALVE MV Area (PHT): 9.85 cm       TR Peak grad:   19.2 mmHg MV Decel Time: 77 msec        TR Vmax:        219.00 cm/s MR Peak grad:    106.5 mmHg MR Mean grad:    65.0 mmHg    SHUNTS MR Vmax:         516.00 cm/s  Systemic VTI:  0.21 m MR Vmean:        387.0 cm/s   Systemic Diam: 2.20 cm MR PISA:         0.57 cm MR PISA Eff ROA: 4 mm MR PISA Radius:  0.30 cm MV E velocity: 52.00 cm/s MV A velocity: 114.00 cm/s MV E/A ratio:  0.46 Mihai Croitoru MD Electronically signed by Sanda Klein MD Signature Date/Time: 02/10/2022/10:06:18 AM    Final       IMPRESSION: {diagnosis}  ***  Today, I talked to the patient and family about the findings and work-up thus far.  We discussed the natural history  of *** and general treatment, highlighting the role of radiotherapy in the management.  We discussed the available radiation techniques, and focused on the details of logistics and delivery.  We reviewed the anticipated acute and late sequelae associated with radiation in this setting.  The patient was encouraged to ask questions that I answered to the best of my ability. *** A patient consent form was discussed and signed.  We retained a copy for our records.  The patient would like to proceed with radiation and will be scheduled for CT simulation.  PLAN: ***    *** minutes of total time was spent for this patient encounter, including preparation, face-to-face counseling with the patient and coordination of care, physical exam, and documentation of the encounter.   ------------------------------------------------  Blair Promise, PhD, MD  This document serves as a record of services personally performed by Gery Pray, MD. It was created on his behalf by [facility]  Mare Ferrari, a trained medical scribe. The creation of this record is based on the scribe's personal observations and the provider's statements to them. This document has been checked and approved by the attending provider.

## 2022-02-20 NOTE — Progress Notes (Signed)
Patient will return 02/22/22 for inhertu. Drug not available today d/t patient not being on the schedule, he was here for post hospitalization visit. Patient aware of date and time.

## 2022-02-20 NOTE — Telephone Encounter (Signed)
Oral Oncology Patient Advocate Encounter  After completing a benefits investigation, prior authorization for Capecitabine is not required at this time through [facility].  Patient's copay is $0.00.  [name] Phone [phone] Fax [phone] 02/20/2022 2:43 PM

## 2022-02-21 ENCOUNTER — Other Ambulatory Visit: Payer: Self-pay

## 2022-02-21 ENCOUNTER — Ambulatory Visit
Admission: RE | Admit: 2022-02-21 | Discharge: 2022-02-21 | Disposition: A | Discharge status: Home / Self Care | Payer: Medicare Other | Source: Ambulatory Visit | Attending: Radiation Oncology | Admitting: Radiation Oncology

## 2022-02-21 ENCOUNTER — Encounter: Payer: Self-pay | Admitting: Radiation Oncology

## 2022-02-21 ENCOUNTER — Encounter: Payer: Self-pay | Admitting: Hematology & Oncology

## 2022-02-21 VITALS — BP 122/81 | HR 117 | Temp 97.7°F | Resp 18 | Wt 154.0 lb

## 2022-02-21 DIAGNOSIS — I081 Rheumatic disorders of both mitral and tricuspid valves: Secondary | ICD-10-CM | POA: Diagnosis not present

## 2022-02-21 DIAGNOSIS — C165 Malignant neoplasm of lesser curvature of stomach, unspecified: Secondary | ICD-10-CM | POA: Insufficient documentation

## 2022-02-21 DIAGNOSIS — I130 Hypertensive heart and chronic kidney disease with heart failure and stage 1 through stage 4 chronic kidney disease, or unspecified chronic kidney disease: Secondary | ICD-10-CM | POA: Diagnosis not present

## 2022-02-21 DIAGNOSIS — Z7901 Long term (current) use of anticoagulants: Secondary | ICD-10-CM | POA: Diagnosis not present

## 2022-02-21 DIAGNOSIS — Z87891 Personal history of nicotine dependence: Secondary | ICD-10-CM | POA: Insufficient documentation

## 2022-02-21 DIAGNOSIS — E1122 Type 2 diabetes mellitus with diabetic chronic kidney disease: Secondary | ICD-10-CM | POA: Diagnosis not present

## 2022-02-21 DIAGNOSIS — C799 Secondary malignant neoplasm of unspecified site: Secondary | ICD-10-CM

## 2022-02-21 DIAGNOSIS — Z809 Family history of malignant neoplasm, unspecified: Secondary | ICD-10-CM | POA: Diagnosis not present

## 2022-02-21 DIAGNOSIS — E7800 Pure hypercholesterolemia, unspecified: Secondary | ICD-10-CM | POA: Diagnosis not present

## 2022-02-21 DIAGNOSIS — C787 Secondary malignant neoplasm of liver and intrahepatic bile duct: Secondary | ICD-10-CM | POA: Insufficient documentation

## 2022-02-21 DIAGNOSIS — R188 Other ascites: Secondary | ICD-10-CM | POA: Diagnosis not present

## 2022-02-21 DIAGNOSIS — N281 Cyst of kidney, acquired: Secondary | ICD-10-CM | POA: Insufficient documentation

## 2022-02-21 DIAGNOSIS — M47816 Spondylosis without myelopathy or radiculopathy, lumbar region: Secondary | ICD-10-CM | POA: Diagnosis not present

## 2022-02-21 DIAGNOSIS — Z79899 Other long term (current) drug therapy: Secondary | ICD-10-CM | POA: Diagnosis not present

## 2022-02-21 DIAGNOSIS — N189 Chronic kidney disease, unspecified: Secondary | ICD-10-CM | POA: Diagnosis not present

## 2022-02-21 DIAGNOSIS — J90 Pleural effusion, not elsewhere classified: Secondary | ICD-10-CM | POA: Diagnosis not present

## 2022-02-21 DIAGNOSIS — Z7952 Long term (current) use of systemic steroids: Secondary | ICD-10-CM | POA: Diagnosis not present

## 2022-02-21 DIAGNOSIS — R197 Diarrhea, unspecified: Secondary | ICD-10-CM | POA: Insufficient documentation

## 2022-02-21 DIAGNOSIS — I2510 Atherosclerotic heart disease of native coronary artery without angina pectoris: Secondary | ICD-10-CM | POA: Diagnosis not present

## 2022-02-22 ENCOUNTER — Inpatient Hospital Stay: Payer: Medicare Other

## 2022-02-22 ENCOUNTER — Other Ambulatory Visit (HOSPITAL_COMMUNITY): Payer: Self-pay

## 2022-02-22 VITALS — BP 123/73 | HR 94 | Temp 98.3°F

## 2022-02-22 DIAGNOSIS — Z5112 Encounter for antineoplastic immunotherapy: Secondary | ICD-10-CM | POA: Diagnosis not present

## 2022-02-22 DIAGNOSIS — C165 Malignant neoplasm of lesser curvature of stomach, unspecified: Secondary | ICD-10-CM

## 2022-02-22 DIAGNOSIS — C799 Secondary malignant neoplasm of unspecified site: Secondary | ICD-10-CM

## 2022-02-22 MED ORDER — DEXTROSE 5 % IV SOLN: Freq: Once | INTRAVENOUS | Status: AC

## 2022-02-22 MED ORDER — HEPARIN SOD (PORK) LOCK FLUSH 100 UNIT/ML IV SOLN
500.0000 [IU] | Freq: Once | INTRAVENOUS | Status: AC | PRN
  Administered 2022-02-22: 500 [IU]

## 2022-02-22 MED ORDER — SODIUM CHLORIDE 0.9% FLUSH
10.0000 mL | INTRAVENOUS | Status: DC | PRN
  Administered 2022-02-22: 10 mL

## 2022-02-22 MED ORDER — ACETAMINOPHEN 325 MG PO TABS
650.0000 mg | ORAL_TABLET | Freq: Once | ORAL | Status: AC
  Administered 2022-02-22: 650 mg via ORAL
  Filled 2022-02-22: qty 2

## 2022-02-22 MED ORDER — CAPECITABINE 500 MG PO TABS
1500.0000 mg | ORAL_TABLET | Freq: Every day | ORAL | 0 refills | Status: DC
  Filled 2022-02-22: qty 75, 25d supply, fill #0
  Filled 2022-02-28: qty 75, 33d supply, fill #0

## 2022-02-22 MED ORDER — FAM-TRASTUZUMAB DERUXTECAN-NXKI CHEMO 100 MG IV SOLR
6.2500 mg/kg | Freq: Once | INTRAVENOUS | Status: AC
  Administered 2022-02-22: 400 mg via INTRAVENOUS
  Filled 2022-02-22: qty 20

## 2022-02-22 MED ORDER — DIPHENHYDRAMINE HCL 25 MG PO CAPS
50.0000 mg | ORAL_CAPSULE | Freq: Once | ORAL | Status: AC
  Administered 2022-02-22: 50 mg via ORAL
  Filled 2022-02-22: qty 2

## 2022-02-22 MED ORDER — FAM-TRASTUZUMAB DERUXTECAN-NXKI CHEMO 100 MG IV SOLR
6.4000 mg/kg | Freq: Once | INTRAVENOUS | Status: DC
  Filled 2022-02-22: qty 20.5

## 2022-02-22 MED ORDER — SODIUM CHLORIDE 0.9 % IV SOLN
10.0000 mg | Freq: Once | INTRAVENOUS | Status: AC
  Administered 2022-02-22: 10 mg via INTRAVENOUS
  Filled 2022-02-22: qty 10

## 2022-02-22 MED ORDER — PALONOSETRON HCL INJECTION 0.25 MG/5ML
0.2500 mg | Freq: Once | INTRAVENOUS | Status: AC
  Administered 2022-02-22: 0.25 mg via INTRAVENOUS
  Filled 2022-02-22: qty 5

## 2022-02-22 NOTE — Patient Instructions (Signed)
[facility]  Discharge Instructions: Thank you for choosing [facility] to provide your oncology and hematology care.   If you have a lab appointment with the [facility], please go directly to the [facility] and check in at the registration area.  Wear comfortable clothing and clothing appropriate for easy access to any Portacath or PICC line.   We strive to give you quality time with your provider. You may need to reschedule your appointment if you arrive late (15 or more minutes).  Arriving late affects you and other patients whose appointments are after yours.  Also, if you miss three or more appointments without notifying the office, you may be dismissed from the clinic at the provider's discretion.      For prescription refill requests, have your pharmacy contact our office and allow 72 hours for refills to be completed.    Today you received the following chemotherapy and/or immunotherapy agents enhurtu   To help prevent nausea and vomiting after your treatment, we encourage you to take your nausea medication as directed.  BELOW ARE SYMPTOMS THAT SHOULD BE REPORTED IMMEDIATELY: *FEVER GREATER THAN 100.4 F (38 C) OR HIGHER *CHILLS OR SWEATING *NAUSEA AND VOMITING THAT IS NOT CONTROLLED WITH YOUR NAUSEA MEDICATION *UNUSUAL SHORTNESS OF BREATH *UNUSUAL BRUISING OR BLEEDING *URINARY PROBLEMS (pain or burning when urinating, or frequent urination) *BOWEL PROBLEMS (unusual diarrhea, constipation, pain near the anus) TENDERNESS IN MOUTH AND THROAT WITH OR WITHOUT PRESENCE OF ULCERS (sore throat, sores in mouth, or a toothache) UNUSUAL RASH, SWELLING OR PAIN  UNUSUAL VAGINAL DISCHARGE OR ITCHING   Items with * indicate a potential emergency and should be followed up as soon as possible or go to the Emergency Department if any problems should occur.  Please show the CHEMOTHERAPY ALERT CARD or IMMUNOTHERAPY ALERT CARD at check-in to the  Emergency Department and triage nurse. Should you have questions after your visit or need to cancel or reschedule your appointment, please contact [facility]  [phone] and follow the prompts.  Office hours are 8:00 a.m. to 4:30 p.m. Monday - Friday. Please note that voicemails left after 4:00 p.m. may not be returned until the following business day.  We are closed weekends and major holidays. You have access to a nurse at all times for urgent questions. Please call the main number to the clinic [phone] and follow the prompts.  For any non-urgent questions, you may also contact your provider using MyChart. We now offer e-Visits for anyone 39 and older to request care online for non-urgent symptoms. For details visit [email or web address].   Also download the MyChart app! Go to the app store, search "MyChart", open the app, select [facility], and log in with your MyChart username and password.  Due to Covid, a mask is required upon entering the hospital/clinic. If you do not have a mask, one will be given to you upon arrival. For doctor visits, patients may have 1 support person aged 1 or older with them. For treatment visits, patients cannot have anyone with them due to current Covid guidelines and our immunocompromised population.

## 2022-02-23 ENCOUNTER — Ambulatory Visit (HOSPITAL_COMMUNITY)
Admission: RE | Admit: 2022-02-23 | Discharge: 2022-02-23 | Disposition: A | Discharge status: Home / Self Care | Payer: Medicare Other | Source: Ambulatory Visit | Attending: Hematology & Oncology | Admitting: Hematology & Oncology

## 2022-02-23 DIAGNOSIS — C799 Secondary malignant neoplasm of unspecified site: Secondary | ICD-10-CM | POA: Insufficient documentation

## 2022-02-23 DIAGNOSIS — R188 Other ascites: Secondary | ICD-10-CM | POA: Diagnosis not present

## 2022-02-23 DIAGNOSIS — C169 Malignant neoplasm of stomach, unspecified: Secondary | ICD-10-CM | POA: Diagnosis present

## 2022-02-23 HISTORY — PX: IR PARACENTESIS: IMG2679

## 2022-02-23 IMAGING — US IR PARACENTESIS
1 series · 3 of 3 positions shown · non-contrast
Comparison: none

INDICATION: Metastatic gastric adenocarcinoma with ascites. Request for and
therapeutic paracentesis.

[Series 1: ir (person_name)/(person_name) · 3 of 3 slices shown]
[im 1/3]
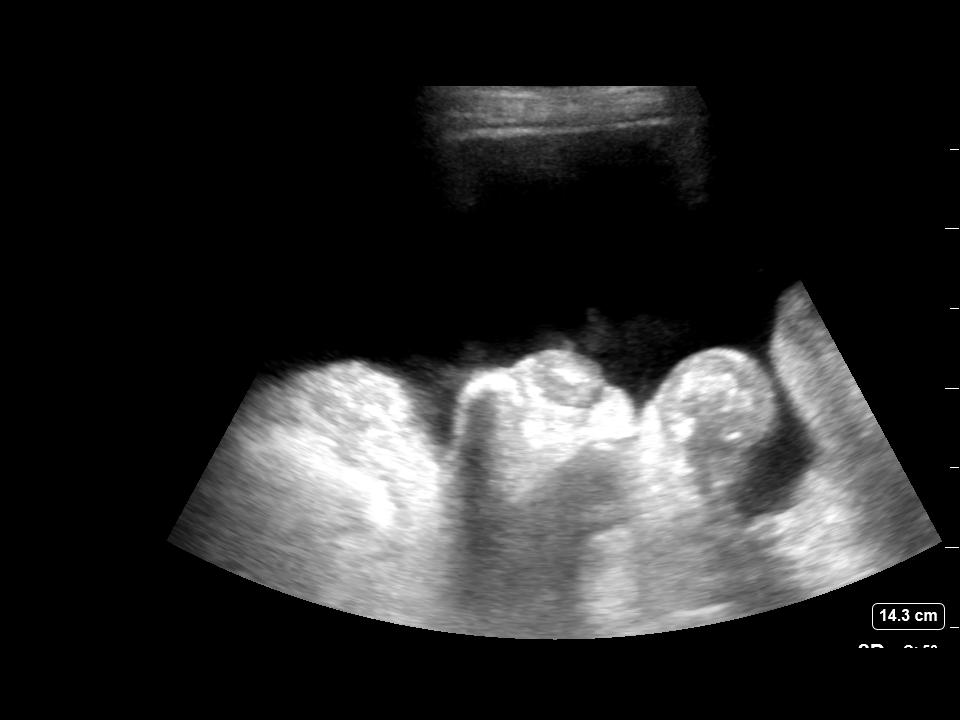
[im 2/3]
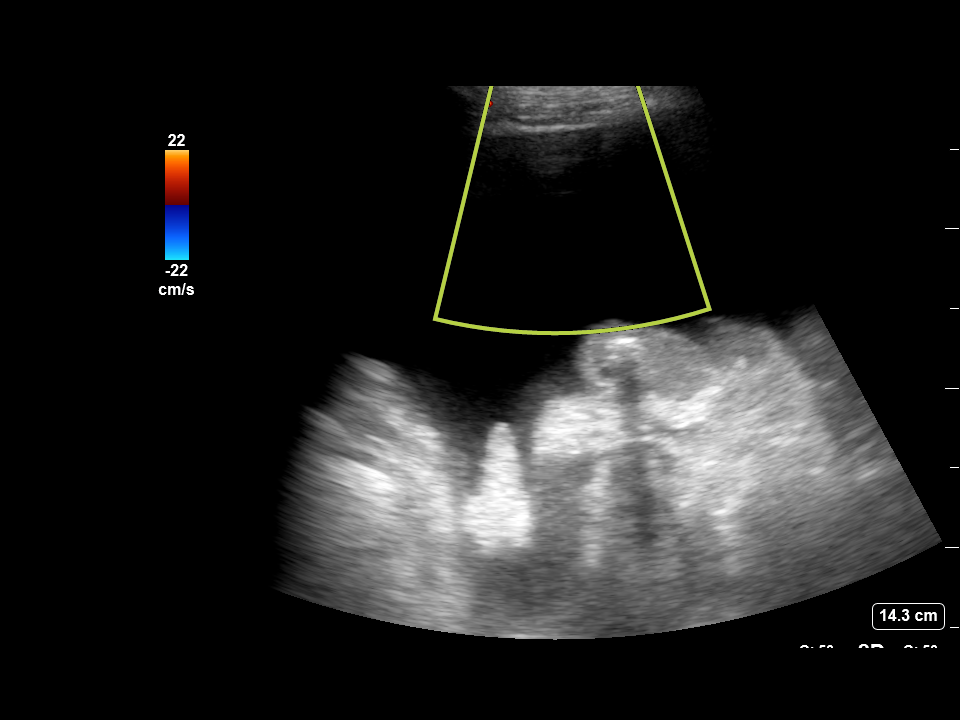
[im 3/3]
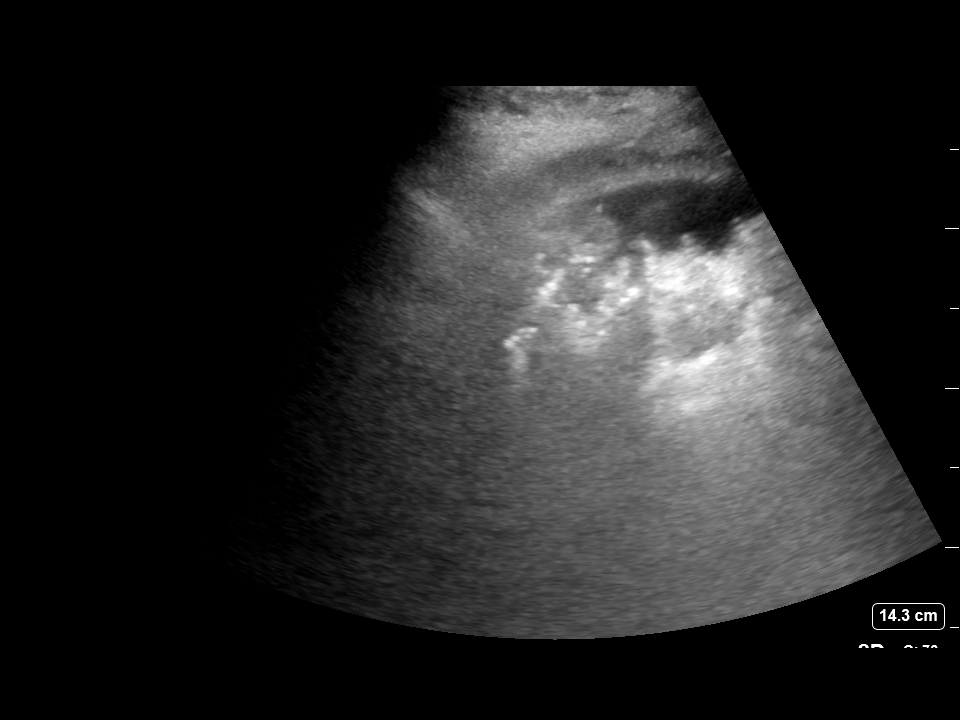

[3 of 3 positions shown; findings below may reference images not displayed]

EXAM:
ULTRASOUND GUIDED PARACENTESIS

MEDICATIONS:
1% lidocaine 8 mL

COMPLICATIONS:
None immediate.

PROCEDURE:
Informed written consent was obtained from the patient after a
discussion of the risks, benefits and alternatives to treatment. A
timeout was performed prior to the initiation of the procedure.

Initial ultrasound scanning demonstrates a large amount of ascites
within the right lower abdominal quadrant. The right lower abdomen
was prepped and draped in the usual sterile fashion. 1% lidocaine
was used for local anesthesia.

Following this, a 19 gauge, 7-cm, Yueh catheter was introduced. An
ultrasound image was saved for documentation purposes. The
paracentesis was performed. The catheter was removed and a dressing
was applied. The patient tolerated the procedure well without
immediate post procedural complication.
Patient received post-procedure intravenous albumin; see nursing
notes for details.
FINDINGS: A total of approximately 4.4 L of clear yellow fluid was removed.
Samples were sent to the laboratory as requested by the clinical
team.
IMPRESSION: Successful ultrasound-guided paracentesis yielding 4.4 liters of
peritoneal fluid.

## 2022-02-23 MED ORDER — LIDOCAINE HCL 1 % IJ SOLN
INTRAMUSCULAR | Status: DC | PRN
  Administered 2022-02-23: 10 mL via INTRADERMAL

## 2022-02-23 MED ORDER — LIDOCAINE HCL 1 % IJ SOLN
INTRAMUSCULAR | Status: AC
  Filled 2022-02-23: qty 20

## 2022-02-23 MED ORDER — ALBUMIN HUMAN 25 % IV SOLN
INTRAVENOUS | Status: AC
  Administered 2022-02-23: 50 g via INTRAVENOUS
  Filled 2022-02-23: qty 200

## 2022-02-23 MED ORDER — ALBUMIN HUMAN 25 % IV SOLN
50.0000 g | Freq: Once | INTRAVENOUS | Status: AC
  Filled 2022-02-23: qty 200

## 2022-02-23 NOTE — Procedures (Signed)
PROCEDURE SUMMARY:  Successful US guided paracentesis from right lateral abdomen.  Yielded 4.4 liters of clear yellow fluid.  No immediate complications.  Patient tolerated well.  EBL = trace  Specimen was sent for labs.  Abigail Butts S Brenley Priore PA-C 02/23/2022 2:49 PM

## 2022-02-26 LAB — CYTOLOGY - NON PAP

## 2022-02-27 ENCOUNTER — Encounter: Payer: Self-pay | Admitting: Hematology & Oncology

## 2022-02-28 ENCOUNTER — Ambulatory Visit
Admission: RE | Admit: 2022-02-28 | Discharge: 2022-02-28 | Disposition: A | Discharge status: Home / Self Care | Payer: Medicare Other | Source: Ambulatory Visit | Attending: Radiation Oncology | Admitting: Radiation Oncology

## 2022-02-28 ENCOUNTER — Other Ambulatory Visit (HOSPITAL_COMMUNITY): Payer: Self-pay

## 2022-02-28 ENCOUNTER — Other Ambulatory Visit: Payer: Self-pay

## 2022-02-28 ENCOUNTER — Encounter: Payer: Self-pay | Admitting: Hematology & Oncology

## 2022-02-28 DIAGNOSIS — C169 Malignant neoplasm of stomach, unspecified: Secondary | ICD-10-CM

## 2022-02-28 DIAGNOSIS — R627 Adult failure to thrive: Secondary | ICD-10-CM | POA: Diagnosis not present

## 2022-02-28 DIAGNOSIS — C165 Malignant neoplasm of lesser curvature of stomach, unspecified: Secondary | ICD-10-CM | POA: Insufficient documentation

## 2022-02-28 DIAGNOSIS — C787 Secondary malignant neoplasm of liver and intrahepatic bile duct: Secondary | ICD-10-CM | POA: Insufficient documentation

## 2022-02-28 NOTE — Telephone Encounter (Signed)
Oral Chemotherapy Pharmacist Encounter  I spoke with patient's daughter, Judson Roch, for overview of: Xeloda for the treatment of metastatic gastric cancer in conjunction with radiation, planned duration 5 weeks.   Counseled on administration, dosing, side effects, monitoring, drug-food interactions, safe handling, storage, and disposal.  Patient will take Xeloda '500mg'$  tablets, 3 tablets ('1500mg'$  total) by mouth once daily, within 30 minutes of finishing a meal, on days of radiation only.  Xeloda and radiation start date: 03/08/22  Adverse effects of Xeloda include but are not limited to: fatigue, decreased blood counts, GI upset, diarrhea, mouth sores, and hand-foot syndrome.  Patient has anti-emetic on hand and knows to take it if nausea develops.   Patient has anti diarrheal on hand and will alert the office of 4 or more loose stools above baseline.   Reviewed importance of keeping a medication schedule and plan for any missed doses. No barriers to medication adherence identified.  Medication reconciliation performed and medication/allergy list updated.  Insurance authorization for Xeloda has been obtained. Test claim at the pharmacy revealed copayment $0 for 1st fill of Xeloda. This will ship from the [facility] on 03/01/22 to deliver to patient's home on 03/02/22.  Patient's daughter informed the pharmacy will reach out 5-7 days prior to needing next fill of Xeloda to coordinate continued medication acquisition to prevent break in therapy.  All questions answered.  Patient's daughter voiced understanding and appreciation.   Medication education handout placed in mail for patient and family. Patient's family knows to call the office with questions or concerns. Oral Chemotherapy Clinic phone number provided.   Leron Croak, PharmD, BCPS Hematology/Oncology Clinical Pharmacist [facility] [phone] 02/28/2022  1:30 PM

## 2022-03-01 ENCOUNTER — Other Ambulatory Visit: Payer: Self-pay

## 2022-03-01 ENCOUNTER — Other Ambulatory Visit: Payer: Self-pay | Admitting: Student

## 2022-03-01 ENCOUNTER — Encounter (HOSPITAL_COMMUNITY): Payer: Self-pay

## 2022-03-01 ENCOUNTER — Emergency Department (HOSPITAL_BASED_OUTPATIENT_CLINIC_OR_DEPARTMENT_OTHER): Payer: Medicare Other

## 2022-03-01 ENCOUNTER — Inpatient Hospital Stay (HOSPITAL_COMMUNITY)
Admission: EM | Admit: 2022-03-01 | Discharge: 2022-03-08 | DRG: 641 | Disposition: A | Discharge status: Hospice | Payer: Medicare Other | Attending: Student | Admitting: Student

## 2022-03-01 ENCOUNTER — Other Ambulatory Visit (HOSPITAL_COMMUNITY): Payer: Self-pay

## 2022-03-01 ENCOUNTER — Encounter: Payer: Self-pay | Admitting: Family

## 2022-03-01 ENCOUNTER — Ambulatory Visit (HOSPITAL_COMMUNITY)
Admission: RE | Admit: 2022-03-01 | Discharge: 2022-03-01 | Disposition: A | Discharge status: Home / Self Care | Payer: Medicare Other | Source: Ambulatory Visit | Attending: Hematology & Oncology | Admitting: Hematology & Oncology

## 2022-03-01 ENCOUNTER — Encounter: Payer: Self-pay | Admitting: Hematology & Oncology

## 2022-03-01 DIAGNOSIS — N189 Chronic kidney disease, unspecified: Secondary | ICD-10-CM | POA: Diagnosis present

## 2022-03-01 DIAGNOSIS — R0609 Other forms of dyspnea: Secondary | ICD-10-CM | POA: Diagnosis present

## 2022-03-01 DIAGNOSIS — Z79899 Other long term (current) drug therapy: Secondary | ICD-10-CM

## 2022-03-01 DIAGNOSIS — K2990 Gastroduodenitis, unspecified, without bleeding: Secondary | ICD-10-CM | POA: Diagnosis present

## 2022-03-01 DIAGNOSIS — D61818 Other pancytopenia: Secondary | ICD-10-CM

## 2022-03-01 DIAGNOSIS — M6282 Rhabdomyolysis: Secondary | ICD-10-CM | POA: Diagnosis present

## 2022-03-01 DIAGNOSIS — R188 Other ascites: Secondary | ICD-10-CM

## 2022-03-01 DIAGNOSIS — R7401 Elevation of levels of liver transaminase levels: Secondary | ICD-10-CM | POA: Diagnosis present

## 2022-03-01 DIAGNOSIS — I5042 Chronic combined systolic (congestive) and diastolic (congestive) heart failure: Secondary | ICD-10-CM | POA: Diagnosis present

## 2022-03-01 DIAGNOSIS — C169 Malignant neoplasm of stomach, unspecified: Secondary | ICD-10-CM | POA: Insufficient documentation

## 2022-03-01 DIAGNOSIS — C799 Secondary malignant neoplasm of unspecified site: Secondary | ICD-10-CM | POA: Insufficient documentation

## 2022-03-01 DIAGNOSIS — Z794 Long term (current) use of insulin: Secondary | ICD-10-CM

## 2022-03-01 DIAGNOSIS — Z603 Acculturation difficulty: Secondary | ICD-10-CM | POA: Diagnosis present

## 2022-03-01 DIAGNOSIS — I252 Old myocardial infarction: Secondary | ICD-10-CM

## 2022-03-01 DIAGNOSIS — M79661 Pain in right lower leg: Secondary | ICD-10-CM | POA: Diagnosis not present

## 2022-03-01 DIAGNOSIS — Z8249 Family history of ischemic heart disease and other diseases of the circulatory system: Secondary | ICD-10-CM

## 2022-03-01 DIAGNOSIS — R7989 Other specified abnormal findings of blood chemistry: Secondary | ICD-10-CM | POA: Diagnosis present

## 2022-03-01 DIAGNOSIS — R531 Weakness: Secondary | ICD-10-CM | POA: Diagnosis present

## 2022-03-01 DIAGNOSIS — I248 Other forms of acute ischemic heart disease: Secondary | ICD-10-CM | POA: Diagnosis present

## 2022-03-01 DIAGNOSIS — R180 Malignant ascites: Secondary | ICD-10-CM

## 2022-03-01 DIAGNOSIS — Z955 Presence of coronary angioplasty implant and graft: Secondary | ICD-10-CM

## 2022-03-01 DIAGNOSIS — K2970 Gastritis, unspecified, without bleeding: Secondary | ICD-10-CM

## 2022-03-01 DIAGNOSIS — N401 Enlarged prostate with lower urinary tract symptoms: Secondary | ICD-10-CM | POA: Diagnosis present

## 2022-03-01 DIAGNOSIS — M79662 Pain in left lower leg: Secondary | ICD-10-CM | POA: Diagnosis not present

## 2022-03-01 DIAGNOSIS — R3911 Hesitancy of micturition: Secondary | ICD-10-CM | POA: Diagnosis present

## 2022-03-01 DIAGNOSIS — I130 Hypertensive heart and chronic kidney disease with heart failure and stage 1 through stage 4 chronic kidney disease, or unspecified chronic kidney disease: Secondary | ICD-10-CM | POA: Diagnosis present

## 2022-03-01 DIAGNOSIS — I959 Hypotension, unspecified: Secondary | ICD-10-CM | POA: Diagnosis not present

## 2022-03-01 DIAGNOSIS — I2510 Atherosclerotic heart disease of native coronary artery without angina pectoris: Secondary | ICD-10-CM | POA: Diagnosis present

## 2022-03-01 DIAGNOSIS — T50995A Adverse effect of other drugs, medicaments and biological substances, initial encounter: Secondary | ICD-10-CM | POA: Diagnosis present

## 2022-03-01 DIAGNOSIS — Z951 Presence of aortocoronary bypass graft: Secondary | ICD-10-CM

## 2022-03-01 DIAGNOSIS — D696 Thrombocytopenia, unspecified: Secondary | ICD-10-CM

## 2022-03-01 DIAGNOSIS — N400 Enlarged prostate without lower urinary tract symptoms: Secondary | ICD-10-CM | POA: Diagnosis present

## 2022-03-01 DIAGNOSIS — Z6823 Body mass index (BMI) 23.0-23.9, adult: Secondary | ICD-10-CM

## 2022-03-01 DIAGNOSIS — D500 Iron deficiency anemia secondary to blood loss (chronic): Secondary | ICD-10-CM | POA: Diagnosis present

## 2022-03-01 DIAGNOSIS — R748 Abnormal levels of other serum enzymes: Secondary | ICD-10-CM | POA: Diagnosis present

## 2022-03-01 DIAGNOSIS — E1122 Type 2 diabetes mellitus with diabetic chronic kidney disease: Secondary | ICD-10-CM | POA: Diagnosis present

## 2022-03-01 DIAGNOSIS — R609 Edema, unspecified: Secondary | ICD-10-CM

## 2022-03-01 DIAGNOSIS — Z87891 Personal history of nicotine dependence: Secondary | ICD-10-CM

## 2022-03-01 DIAGNOSIS — Z9221 Personal history of antineoplastic chemotherapy: Secondary | ICD-10-CM

## 2022-03-01 DIAGNOSIS — C787 Secondary malignant neoplasm of liver and intrahepatic bile duct: Secondary | ICD-10-CM

## 2022-03-01 DIAGNOSIS — R627 Adult failure to thrive: Principal | ICD-10-CM

## 2022-03-01 DIAGNOSIS — Z79890 Hormone replacement therapy (postmenopausal): Secondary | ICD-10-CM

## 2022-03-01 DIAGNOSIS — E119 Type 2 diabetes mellitus without complications: Secondary | ICD-10-CM

## 2022-03-01 DIAGNOSIS — E876 Hypokalemia: Secondary | ICD-10-CM | POA: Diagnosis present

## 2022-03-01 DIAGNOSIS — Z7189 Other specified counseling: Secondary | ICD-10-CM

## 2022-03-01 DIAGNOSIS — E7800 Pure hypercholesterolemia, unspecified: Secondary | ICD-10-CM | POA: Diagnosis present

## 2022-03-01 DIAGNOSIS — D539 Nutritional anemia, unspecified: Secondary | ICD-10-CM | POA: Diagnosis present

## 2022-03-01 DIAGNOSIS — Z66 Do not resuscitate: Secondary | ICD-10-CM | POA: Diagnosis present

## 2022-03-01 DIAGNOSIS — C786 Secondary malignant neoplasm of retroperitoneum and peritoneum: Secondary | ICD-10-CM

## 2022-03-01 DIAGNOSIS — I255 Ischemic cardiomyopathy: Secondary | ICD-10-CM | POA: Diagnosis present

## 2022-03-01 DIAGNOSIS — Z515 Encounter for palliative care: Secondary | ICD-10-CM

## 2022-03-01 DIAGNOSIS — Z85028 Personal history of other malignant neoplasm of stomach: Secondary | ICD-10-CM

## 2022-03-01 DIAGNOSIS — X58XXXA Exposure to other specified factors, initial encounter: Secondary | ICD-10-CM | POA: Diagnosis present

## 2022-03-01 LAB — COMPREHENSIVE METABOLIC PANEL
ALT: 194 U/L — ABNORMAL HIGH (ref 0–44)
AST: 408 U/L — ABNORMAL HIGH (ref 15–41)
Albumin: 2.9 g/dL — ABNORMAL LOW (ref 3.5–5.0)
Alkaline Phosphatase: 192 U/L — ABNORMAL HIGH (ref 38–126)
Anion gap: 7 (ref 5–15)
BUN: 29 mg/dL — ABNORMAL HIGH (ref 8–23)
CO2: 21 mmol/L — ABNORMAL LOW (ref 22–32)
Calcium: 8.9 mg/dL (ref 8.9–10.3)
Chloride: 111 mmol/L (ref 98–111)
Creatinine, Ser: 1.2 mg/dL (ref 0.61–1.24)
GFR, Estimated: >60 mL/min (ref >60)
Glucose, Bld: 110 mg/dL — ABNORMAL HIGH (ref 70–99)
Potassium: 3.9 mmol/L (ref 3.5–5.1)
Sodium: 139 mmol/L (ref 135–145)
Total Bilirubin: 1.8 mg/dL — ABNORMAL HIGH (ref 0.3–1.2)
Total Protein: 5.3 g/dL — ABNORMAL LOW (ref 6.5–8.1)

## 2022-03-01 LAB — CBC WITH DIFFERENTIAL/PLATELET
Abs Immature Granulocytes: 0.04 10*3/uL (ref 0.00–0.07)
Basophils Absolute: 0 10*3/uL (ref 0.0–0.1)
Basophils Relative: 1 %
Eosinophils Absolute: 0.2 10*3/uL (ref 0.0–0.5)
Eosinophils Relative: 3 %
HCT: 31.6 % — ABNORMAL LOW (ref 39.0–52.0)
Hemoglobin: 10.5 g/dL — ABNORMAL LOW (ref 13.0–17.0)
Immature Granulocytes: 1 %
Lymphocytes Relative: 9 %
Lymphs Abs: 0.5 10*3/uL — ABNORMAL LOW (ref 0.7–4.0)
MCH: 35.4 pg — ABNORMAL HIGH (ref 26.0–34.0)
MCHC: 33.2 g/dL (ref 30.0–36.0)
MCV: 106.4 fL — ABNORMAL HIGH (ref 80.0–100.0)
Monocytes Absolute: 0.2 10*3/uL (ref 0.1–1.0)
Monocytes Relative: 4 %
Neutro Abs: 4.5 10*3/uL (ref 1.7–7.7)
Neutrophils Relative %: 82 %
Platelets: 56 10*3/uL — ABNORMAL LOW (ref 150–400)
RBC: 2.97 MIL/uL — ABNORMAL LOW (ref 4.22–5.81)
RDW: 20.3 % — ABNORMAL HIGH (ref 11.5–15.5)
WBC: 5.5 10*3/uL (ref 4.0–10.5)
nRBC: 0 % (ref 0.0–0.2)

## 2022-03-01 LAB — TROPONIN I (HIGH SENSITIVITY): Troponin I (High Sensitivity): 50 ng/L — ABNORMAL HIGH (ref <18)

## 2022-03-01 LAB — GLUCOSE, CAPILLARY: Glucose-Capillary: 118 mg/dL — ABNORMAL HIGH (ref 70–99)

## 2022-03-01 LAB — AMMONIA: Ammonia: 23 umol/L (ref 9–35)

## 2022-03-01 LAB — MAGNESIUM: Magnesium: 2 mg/dL (ref 1.7–2.4)

## 2022-03-01 IMAGING — CT CT CHEST-ABD-PELV W/ CM
2 of 5 series · 13 of 36 positions shown, 15 images · IV contrast (agent unspecified)
Comparison: CT abdomen pelvis, [DATE], CT chest abdomen pelvis,
[DATE]

CLINICAL DATA: Metastatic gastric cancer, assess treatment
response, progressive weakness * Tracking Code: BO *

EXAM:
CT CHEST, ABDOMEN, AND PELVIS WITH CONTRAST
TECHNIQUE: Multidetector CT imaging of the chest, abdomen and pelvis was
performed following the standard protocol during bolus
administration of intravenous contrast.

[Series 2: cap with · axial · 0.86mm/px · z∈[+873,+1443]mm · 10 of 140 slices shown, 12 images]
[im 13/140  mediastinal]
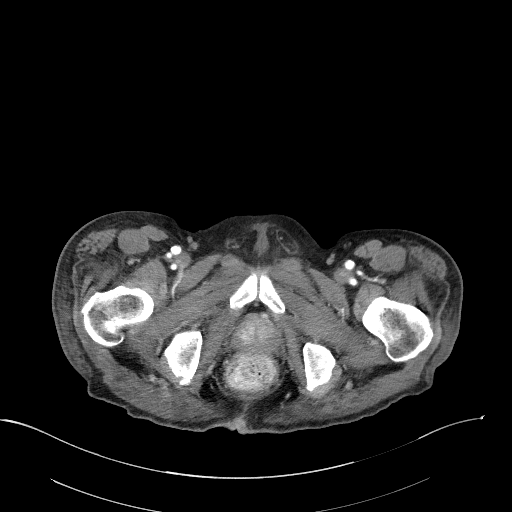
[im 13/140  bone]
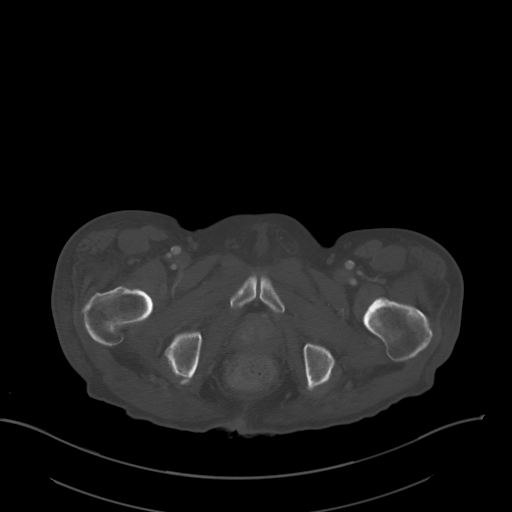
[im 26/140  mediastinal]
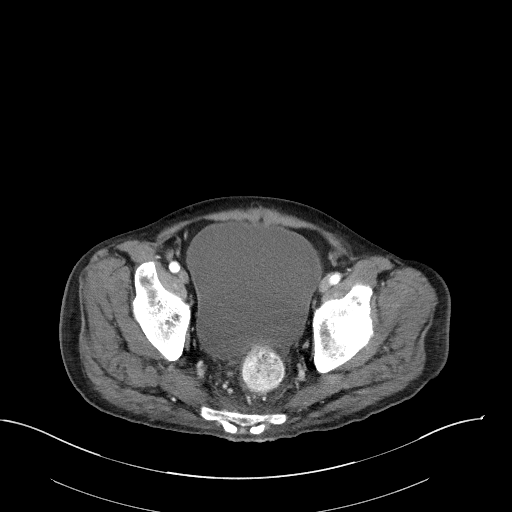
[im 38/140  mediastinal]
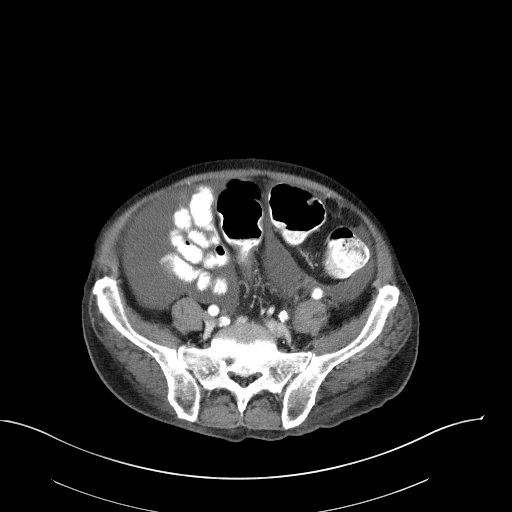
[im 51/140  mediastinal]
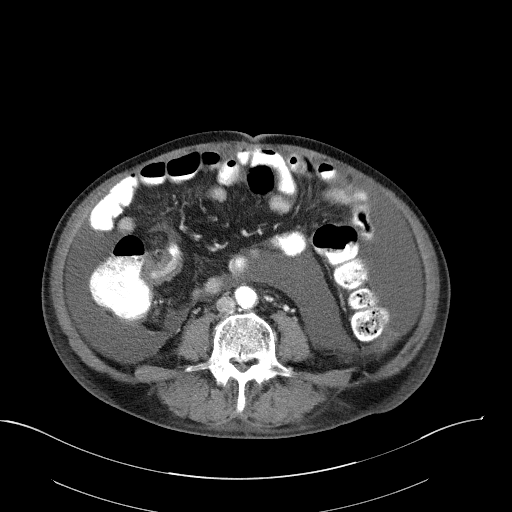
[im 64/140  mediastinal]
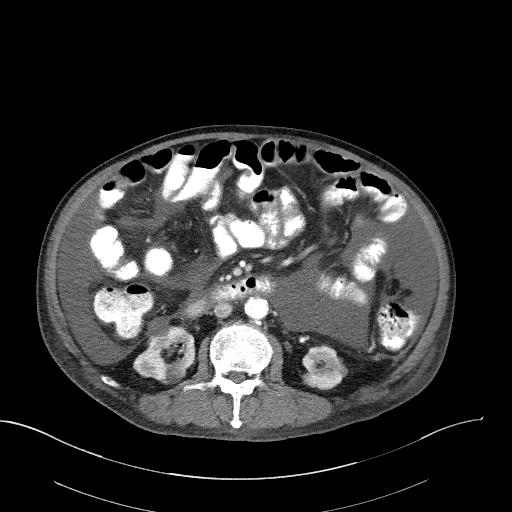
[im 76/140  mediastinal]
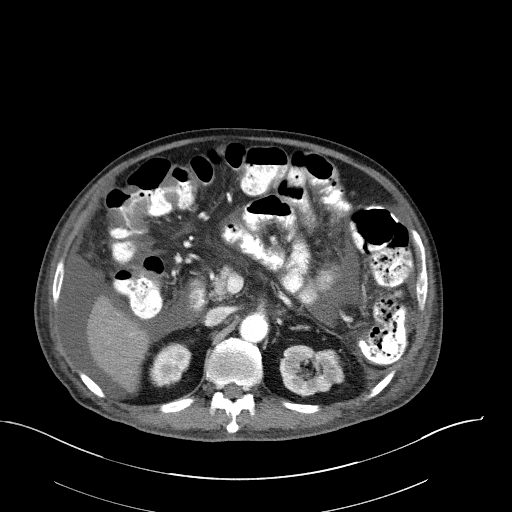
[im 89/140  mediastinal]
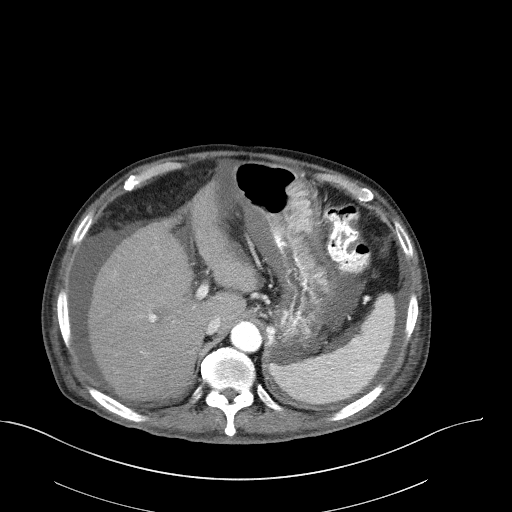
[im 102/140  mediastinal]
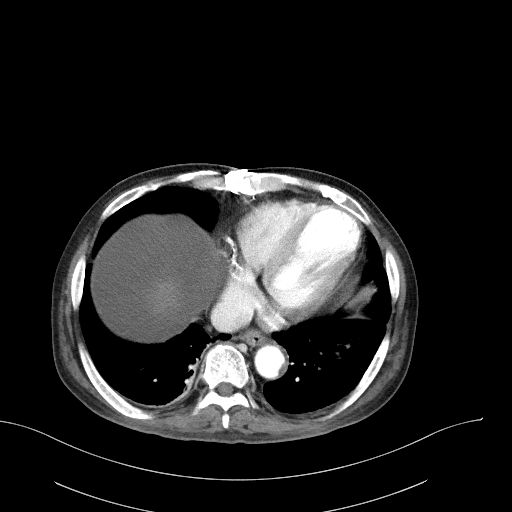
[im 114/140  mediastinal]
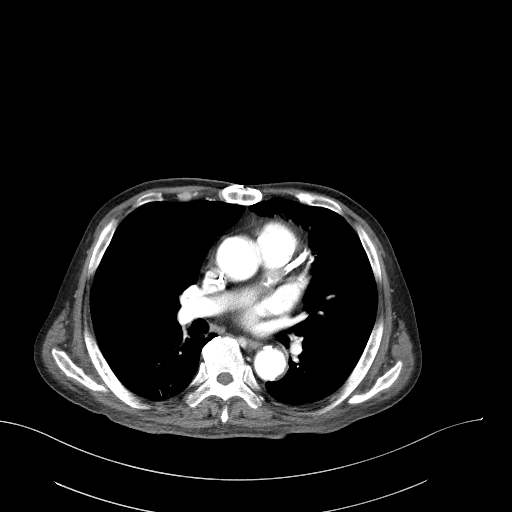
[im 114/140  bone]
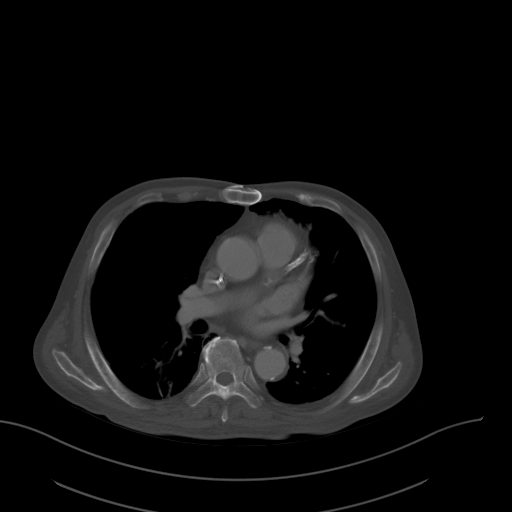
[im 127/140  mediastinal]
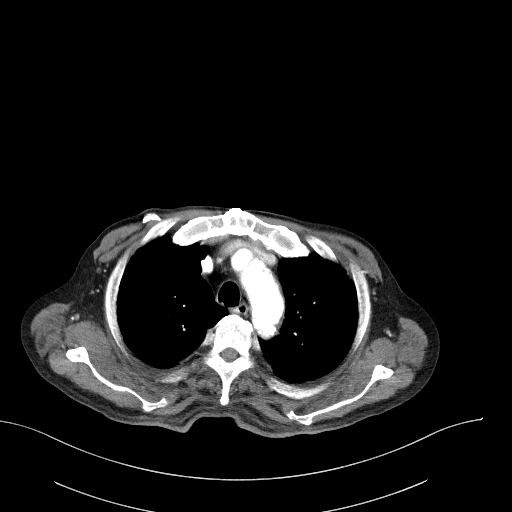

[Series 5: coronals · coronal · 0.75mm/px · 3 of 138 slices shown]
[im 28/138  mediastinal]
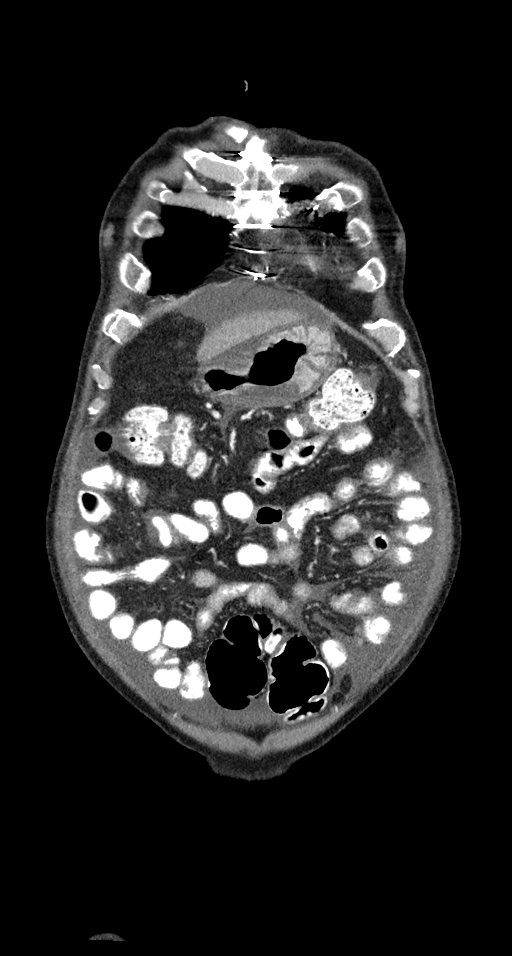
[im 55/138  mediastinal]
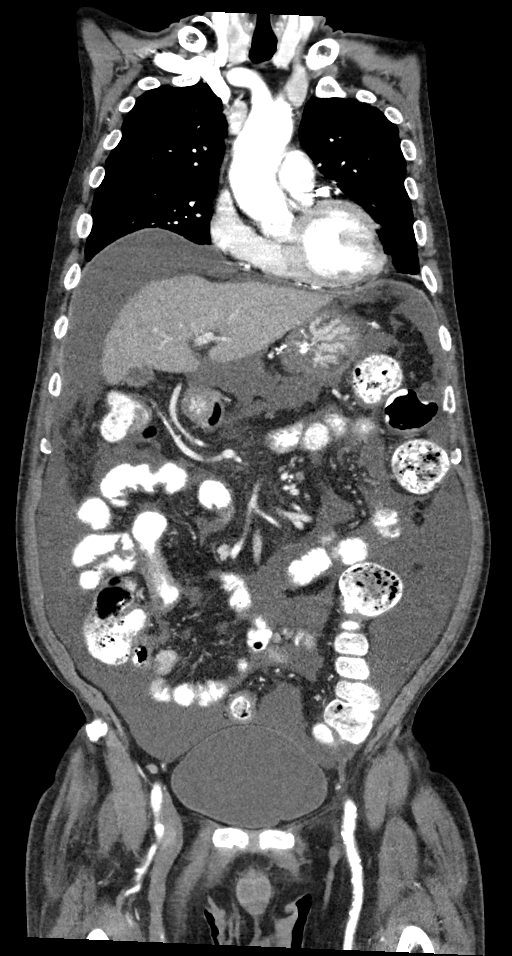
[im 83/138  mediastinal]
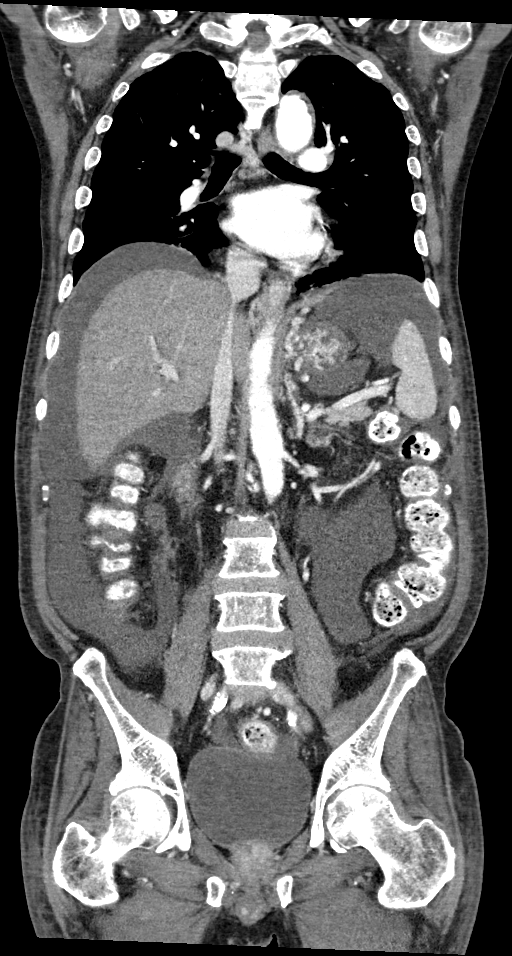

[13 of 36 positions shown; findings below may reference images not displayed]

RADIATION DOSE REDUCTION: This exam was performed according to the
departmental dose-optimization program which includes automated
exposure control, adjustment of the mA and/or kV according to
patient size and/or use of iterative reconstruction technique.

CONTRAST:  100mL OMNIPAQUE IOHEXOL 300 MG/ML SOLN additional oral
enteric contrast
FINDINGS: CT CHEST FINDINGS

Cardiovascular: Right chest port catheter. Aortic atherosclerosis.
Normal heart size. Three-vessel coronary artery calcifications
status post median sternotomy and CABG. No pericardial effusion.

Mediastinum/Nodes: No enlarged mediastinal, hilar, or axillary lymph
nodes. Thyroid gland, trachea, and esophagus demonstrate no
significant findings.

Lungs/Pleura: Unchanged, bandlike scarring and or atelectasis of the
bilateral lung bases. Unchanged fissural nodule of the superior
segment right lower lobe measuring 0.3 cm, stable and benign (series
4, image 70). Mild paraseptal emphysema. No pleural effusion or
pneumothorax.

Musculoskeletal: No chest wall mass or suspicious osseous lesions
identified.

CT ABDOMEN PELVIS FINDINGS

Hepatobiliary: Unchanged, very subtle hypodense metastases of the
left lobe of the liver, hepatic segment II, measuring 1.1 x 1.0 cm
(series 2, image 43) and in the posterior right lobe of the liver,
hepatic segment VI measuring 1.1 x 0.9 cm (series 2, image 55). No
gallstones, gallbladder wall thickening, or biliary dilatation.

Pancreas: Unremarkable. No pancreatic ductal dilatation or
surrounding inflammatory changes.

Spleen: Normal in size without significant abnormality.

Adrenals/Urinary Tract: Adrenal glands are unremarkable. Lobulated
renal cortical scarring. Simple, benign renal cortical cysts of the
inferior pole of the right kidney, for which no further follow-up or
characterization is required. Bladder is unremarkable.

Stomach/Bowel: Unchanged, treated mass of the lesser curvature of
the stomach measuring approximately 7.1 x 2.9 cm (series 2, image
53). Appendix appears normal. No evidence of bowel wall thickening,
distention, or inflammatory changes.

Vascular/Lymphatic: Aortic atherosclerosis. No enlarged abdominal or
pelvic lymph nodes.

Reproductive: No mass or other abnormality.

Other: No abdominal wall hernia or abnormality. Significantly
increased, large volume ascites throughout the abdomen and pelvis,
with stranding of the omentum and peritoneum (series 2, image 54)

Musculoskeletal: No acute osseous findings.
IMPRESSION: 1. Unchanged, treated mass of the lesser curvature of the stomach,
in keeping with known gastric malignancy.
2. Unchanged, very subtle liver metastases.  No new liver lesions.
3. Significantly increased, large volume ascites throughout the
abdomen and pelvis, with stranding of the omentum and peritoneum,
suspicious for peritoneal metastatic disease but without overt
nodularity.
4. No evidence of metastatic disease in the chest.

Aortic Atherosclerosis ([DO]-[DO]) and Emphysema ([DO]-[DO]).

## 2022-03-01 MED ORDER — ACETAMINOPHEN 325 MG PO TABS
650.0000 mg | ORAL_TABLET | Freq: Four times a day (QID) | ORAL | Status: DC | PRN
  Administered 2022-03-01 – 2022-03-03 (×3): 650 mg via ORAL
  Filled 2022-03-01 (×3): qty 2

## 2022-03-01 MED ORDER — INSULIN ASPART 100 UNIT/ML IJ SOLN
0.0000 [IU] | Freq: Every day | INTRAMUSCULAR | Status: DC
  Administered 2022-03-02 – 2022-03-03 (×2): 2 [IU] via SUBCUTANEOUS
  Filled 2022-03-01: qty 0.05

## 2022-03-01 MED ORDER — METOPROLOL TARTRATE 12.5 MG HALF TABLET
12.5000 mg | ORAL_TABLET | Freq: Two times a day (BID) | ORAL | Status: AC
  Administered 2022-03-01 – 2022-03-02 (×2): 12.5 mg via ORAL
  Filled 2022-03-01 (×2): qty 1

## 2022-03-01 MED ORDER — TAMSULOSIN HCL 0.4 MG PO CAPS
0.4000 mg | ORAL_CAPSULE | Freq: Every day | ORAL | Status: DC
  Administered 2022-03-01 – 2022-03-07 (×6): 0.4 mg via ORAL
  Filled 2022-03-01 (×7): qty 1

## 2022-03-01 MED ORDER — INSULIN ASPART 100 UNIT/ML IJ SOLN
0.0000 [IU] | Freq: Three times a day (TID) | INTRAMUSCULAR | Status: DC
  Administered 2022-03-02: 2 [IU] via SUBCUTANEOUS
  Administered 2022-03-02: 7 [IU] via SUBCUTANEOUS
  Administered 2022-03-03 (×2): 2 [IU] via SUBCUTANEOUS
  Administered 2022-03-03: 5 [IU] via SUBCUTANEOUS
  Administered 2022-03-04: 2 [IU] via SUBCUTANEOUS
  Administered 2022-03-04: 5 [IU] via SUBCUTANEOUS
  Filled 2022-03-01: qty 0.09

## 2022-03-01 MED ORDER — SODIUM CHLORIDE 0.9 % IV BOLUS
1000.0000 mL | Freq: Once | INTRAVENOUS | Status: AC
  Administered 2022-03-01: 1000 mL via INTRAVENOUS

## 2022-03-01 MED ORDER — ONDANSETRON HCL 4 MG/2ML IJ SOLN: 4.0000 mg | Freq: Four times a day (QID) | INTRAMUSCULAR | Status: DC | PRN

## 2022-03-01 MED ORDER — ACETAMINOPHEN 650 MG RE SUPP
650.0000 mg | Freq: Four times a day (QID) | RECTAL | Status: DC | PRN
Start: 2022-03-01 — End: 2022-03-07

## 2022-03-01 MED ORDER — IOHEXOL 300 MG/ML  SOLN
100.0000 mL | Freq: Once | INTRAMUSCULAR | Status: AC | PRN
  Administered 2022-03-01: 100 mL via INTRAVENOUS

## 2022-03-01 MED ORDER — ONDANSETRON HCL 4 MG PO TABS: 4.0000 mg | ORAL_TABLET | Freq: Four times a day (QID) | ORAL | Status: DC | PRN

## 2022-03-01 NOTE — ED Provider Notes (Signed)
[place] DEPT Provider Note   CSN: [id number] Arrival date & time: 03/01/22  1401     History  Chief Complaint  Patient presents with   Weakness    Jimmy Blankenship is a 71 y.o. male.  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  Pt has developed ascites and had a paracentesis on 6/6.  No malignant cells.  The ascites have reformed, and he is scheduled for a repeat paracentesis tomorrow at [facility].  Pt is currently getting Enhertu (last infusion 6/15).  Pt is scheduled for radiation tx soon.  Pt has had very little appetite this week.  He has been getting weaker.  He had been able to ambulate, but has not been able to walk.  He had a CT chest/abd/pelvis today and came to the ED for further eval.  Pt denies any pain.  No black stool.    Due to language barrier, an [name] interpreter was present during the history-taking and subsequent discussion (and for part of the physical exam) with this patient.        Home Medications Prior to Admission medications   Medication Sig Start Date End Date Taking? Authorizing Provider  atorvastatin (LIPITOR) 40 MG tablet Take 80 mg by mouth daily.    [provider]  bisacodyl (DULCOLAX) 10 MG suppository Place 1 suppository (10 mg total) rectally as needed for moderate constipation. 02/14/22   Debbe Odea, MD  capecitabine (XELODA) 500 MG tablet Take 3 tablets (1,500 mg total) by mouth daily after breakfast. Take only on days of radiation Monday through Friday. 02/22/22   Volanda Napoleon, MD  dexamethasone (DECADRON) 4 MG tablet Take 2 tablets (8 mg total) by mouth daily. Start the day after chemotherapy for 2 days. Patient not taking: Reported on 02/21/2022 02/01/21   Volanda Napoleon, MD  diphenhydrAMINE (BENADRYL) 25 MG tablet Take 25 mg by mouth at bedtime as needed for itching.     [provider]  diphenoxylate-atropine (LOMOTIL) 2.5-0.025 MG tablet TAKE ONE TABLET BY MOUTH FOUR TIMES A DAY AS NEEDED FOR DIARRHEA OR LOOSE STOOLS Patient taking differently: Take 1 tablet by mouth 4 (four) times daily as needed for diarrhea or loose stools. 10/31/21   Volanda Napoleon, MD  docusate sodium (COLACE) 100 MG capsule Take 1 capsule (100 mg total) by mouth 2 (two) times daily. 02/14/22   Debbe Odea, MD  feeding supplement (ENSURE ENLIVE / ENSURE PLUS) LIQD Take 237 mLs by mouth 2 (two) times daily between meals. Patient taking differently: Take 237 mLs by mouth daily. 08/16/20   Regalado, Belkys A, MD  furosemide (LASIX) 20 MG tablet Take 1 tablet (20 mg total) by mouth daily as needed (for abdominal swelling (ascites)). Only take if you have treated constipation first. 02/14/22 02/14/23  Debbe Odea, MD  gabapentin (NEURONTIN) 300 MG capsule Take 1 capsule (300 mg total) by mouth 4 (four) times daily. 10/31/21   Volanda Napoleon, MD  insulin glargine (LANTUS) 100 UNIT/ML Solostar Pen Inject into the skin.    [provider]  lidocaine-prilocaine (EMLA) cream Apply to affected area once Patient taking differently: 1 application  daily as needed (port access). 02/01/21   Volanda Napoleon, MD  LORazepam (ATIVAN) 0.5 MG tablet Take 1 tablet (0.5 mg total) by mouth every 6 (six) hours as  needed (Nausea or vomiting). 02/01/21   Volanda Napoleon, MD  megestrol (MEGACE) 400 MG/10ML suspension Take 10 mLs (400 mg total) by mouth 2 (two) times daily. Patient taking differently: Take 400 mg by mouth 2 (two) times daily as needed (appetite). 02/21/21   Volanda Napoleon, MD  melatonin 5 MG TABS Take 5 mg by mouth at bedtime.    [provider]  metoprolol tartrate (LOPRESSOR) 25 MG tablet Take 0.5 tablets (12.5 mg total) by mouth 2 (two) times daily. 02/14/22 03/16/22  Debbe Odea, MD  ondansetron (ZOFRAN) 8 MG tablet Take 1 tablet (8 mg total) by mouth 2 (two) times daily as  needed for refractory nausea / vomiting. Start on day 3 after chemo. 02/01/21   Volanda Napoleon, MD  pantoprazole (PROTONIX) 40 MG tablet Take 1 tablet (40 mg total) by mouth 2 (two) times daily. Follow up with gastroenterology for additional instructions 12/30/21 02/28/22  Elodia Florence., MD  polyethylene glycol (MIRALAX / GLYCOLAX) 17 g packet Take 17 g by mouth daily as needed for mild constipation. 02/14/22   Debbe Odea, MD  prochlorperazine (COMPAZINE) 10 MG tablet Take 1 tablet (10 mg total) by mouth every 6 (six) hours as needed (Nausea or vomiting). Patient taking differently: Take 10 mg by mouth every 6 (six) hours as needed for nausea or vomiting (Nausea or vomiting). 02/01/21   Volanda Napoleon, MD  sucralfate (CARAFATE) 1 g tablet Take 1 tablet (1 g total) by mouth 4 (four) times daily -  with meals and at bedtime for 14 days. 12/30/21 01/13/22  Elodia Florence., MD  tamsulosin (FLOMAX) 0.4 MG CAPS capsule Take 0.4 mg by mouth at bedtime. 08/07/21   [provider]      Allergies    Patient has no known allergies.    Review of Systems   Review of Systems  Neurological:  Positive for weakness.  All other systems reviewed and are negative.   Physical Exam Updated Vital Signs BP 120/68   Pulse 98   Temp (!) 97.5 F (36.4 C) (Axillary)   Resp 19   Ht '5\' 9"'$  (1.753 m)   Wt 68 kg   SpO2 91%   BMI 22.15 kg/m  Physical Exam Vitals and nursing note reviewed.  Constitutional:      Appearance: Normal appearance.  HENT:     Head: Normocephalic and atraumatic.     Right Ear: External ear normal.     Left Ear: External ear normal.     Nose: Nose normal.     Mouth/Throat:     Mouth: Mucous membranes are dry.  Eyes:     Extraocular Movements: Extraocular movements intact.     Conjunctiva/sclera: Conjunctivae normal.     Pupils: Pupils are equal, round, and reactive to light.  Cardiovascular:     Rate and Rhythm: Normal rate and regular rhythm.     Pulses:  Normal pulses.     Heart sounds: Normal heart sounds.  Pulmonary:     Effort: Pulmonary effort is normal.     Breath sounds: Normal breath sounds.  Abdominal:     General: Abdomen is flat. Bowel sounds are normal.     Palpations: Abdomen is soft. There is fluid wave.     Comments: +ascites  Musculoskeletal:        General: Normal range of motion.     Cervical back: Normal range of motion and neck supple.     Right lower leg:  Edema present.     Left lower leg: Edema present.  Skin:    General: Skin is warm.     Capillary Refill: Capillary refill takes less than 2 seconds.  Neurological:     General: No focal deficit present.     Mental Status: He is alert and oriented to person, place, and time.  Psychiatric:        Mood and Affect: Mood normal.        Behavior: Behavior normal.     ED Results / Procedures / Treatments   Labs (all labs ordered are listed, but only abnormal results are displayed) Labs Reviewed  COMPREHENSIVE METABOLIC PANEL - Abnormal; Notable for the following components:      Result Value   CO2 21 (*)    Glucose, Bld 110 (*)    BUN 29 (*)    Total Protein 5.3 (*)    Albumin 2.9 (*)    AST 408 (*)    ALT 194 (*)    Alkaline Phosphatase 192 (*)    Total Bilirubin 1.8 (*)    All other components within normal limits  CBC WITH DIFFERENTIAL/PLATELET - Abnormal; Notable for the following components:   RBC 2.97 (*)    Hemoglobin 10.5 (*)    HCT 31.6 (*)    MCV 106.4 (*)    MCH 35.4 (*)    RDW 20.3 (*)    Platelets 56 (*)    All other components within normal limits  TROPONIN I (HIGH SENSITIVITY) - Abnormal; Notable for the following components:   Troponin I (High Sensitivity) 50 (*)    All other components within normal limits  MAGNESIUM  AMMONIA  URINALYSIS, ROUTINE W REFLEX MICROSCOPIC  TROPONIN I (HIGH SENSITIVITY)    EKG EKG Interpretation  Date/Time:  Thursday March 01 2022 14:15:54 EDT Ventricular Rate:  88 PR Interval:  145 QRS  Duration: 99 QT Interval:  357 QTC Calculation: 432 R Axis:   -13 Text Interpretation: Sinus rhythm Inferior infarct, old Abnormal lateral Q waves Anterior infarct, old No significant change since last tracing Confirmed by Isla Pence [phone]) on 03/01/2022 3:41:11 PM  Radiology VAS [place] LOWER EXTREMITY VENOUS (DVT) (7a-7p)  Result Date: 03/01/2022  Lower Venous DVT Study Patient Name:  Early Osmond  Date of Exam:   03/01/2022 Medical Rec #: [id number]      Accession #:    [id number] Date of Birth: 10/29/1950      Patient Gender: M Patient Age:   43 years Exam Location:  [facility] Procedure:      VAS [place] LOWER EXTREMITY VENOUS (DVT) Referring Phys: Aeron Lheureux --------------------------------------------------------------------------------  Indications: Edema.  Risk Factors: Cancer. Limitations: Poor ultrasound/tissue interface and calcific shadowing. Comparison Study: No previous exams Performing Technologist: Jody Hill RVT, RDMS  Examination Guidelines: A complete evaluation includes B-mode imaging, spectral Doppler, color Doppler, and power Doppler as needed of all accessible portions of each vessel. Bilateral testing is considered an integral part of a complete examination. Limited examinations for reoccurring indications may be performed as noted. The reflux portion of the exam is performed with the patient in reverse Trendelenburg.  +---------+---------------+---------+-----------+----------+--------------+ RIGHT    CompressibilityPhasicitySpontaneityPropertiesThrombus Aging +---------+---------------+---------+-----------+----------+--------------+ CFV      Full           Yes      Yes                                 +---------+---------------+---------+-----------+----------+--------------+  SFJ      Full                                                        +---------+---------------+---------+-----------+----------+--------------+ FV Prox  Full           Yes       Yes                                 +---------+---------------+---------+-----------+----------+--------------+ FV Mid   Full           Yes      Yes                                 +---------+---------------+---------+-----------+----------+--------------+ FV DistalFull           Yes      Yes                                 +---------+---------------+---------+-----------+----------+--------------+ PFV      Full                                                        +---------+---------------+---------+-----------+----------+--------------+ POP      Full           Yes      Yes                                 +---------+---------------+---------+-----------+----------+--------------+ PTV      Full                                                        +---------+---------------+---------+-----------+----------+--------------+ PERO     Full                                                        +---------+---------------+---------+-----------+----------+--------------+   +---------+---------------+---------+-----------+----------+-------------------+ LEFT     CompressibilityPhasicitySpontaneityPropertiesThrombus Aging      +---------+---------------+---------+-----------+----------+-------------------+ CFV      Full           Yes      Yes                                      +---------+---------------+---------+-----------+----------+-------------------+ SFJ      Full                                                             +---------+---------------+---------+-----------+----------+-------------------+  FV Prox  Full           Yes      Yes                                      +---------+---------------+---------+-----------+----------+-------------------+ FV Mid   Full           Yes      Yes                                      +---------+---------------+---------+-----------+----------+-------------------+ FV DistalFull            Yes      Yes                                      +---------+---------------+---------+-----------+----------+-------------------+ PFV      Full                                                             +---------+---------------+---------+-----------+----------+-------------------+ POP      Full           Yes      Yes                                      +---------+---------------+---------+-----------+----------+-------------------+ PTV      Full                                                             +---------+---------------+---------+-----------+----------+-------------------+ PERO                                                  Not well visualized +---------+---------------+---------+-----------+----------+-------------------+    Summary: BILATERAL: - No evidence of deep vein thrombosis seen in the lower extremities, bilaterally. -No evidence of popliteal cyst, bilaterally   *See table(s) above for measurements and observations.    Preliminary    CT CHEST ABDOMEN PELVIS W CONTRAST  Result Date: 03/01/2022 CLINICAL DATA:  Metastatic gastric cancer, assess treatment response, progressive weakness * Tracking Code: BO * EXAM: CT CHEST, ABDOMEN, AND PELVIS WITH CONTRAST TECHNIQUE: Multidetector CT imaging of the chest, abdomen and pelvis was performed following the standard protocol during bolus administration of intravenous contrast. RADIATION DOSE REDUCTION: This exam was performed according to the departmental dose-optimization program which includes automated exposure control, adjustment of the mA and/or kV according to patient size and/or use of iterative reconstruction technique. CONTRAST:  168m OMNIPAQUE IOHEXOL 300 MG/ML SOLN additional oral enteric contrast COMPARISON:  CT abdomen pelvis, 12/22/2021, CT chest abdomen pelvis, 11/17/2021 FINDINGS: CT CHEST FINDINGS Cardiovascular: Right chest port catheter. Aortic atherosclerosis. Normal heart size. Three-vessel  coronary artery  calcifications status post median sternotomy and CABG. No pericardial effusion. Mediastinum/Nodes: No enlarged mediastinal, hilar, or axillary lymph nodes. Thyroid gland, trachea, and esophagus demonstrate no significant findings. Lungs/Pleura: Unchanged, bandlike scarring and or atelectasis of the bilateral lung bases. Unchanged fissural nodule of the superior segment right lower lobe measuring 0.3 cm, stable and benign (series 4, image 70). Mild paraseptal emphysema. No pleural effusion or pneumothorax. Musculoskeletal: No chest wall mass or suspicious osseous lesions identified. CT ABDOMEN PELVIS FINDINGS Hepatobiliary: Unchanged, very subtle hypodense metastases of the left lobe of the liver, hepatic segment II, measuring 1.1 x 1.0 cm (series 2, image 43) and in the posterior right lobe of the liver, hepatic segment VI measuring 1.1 x 0.9 cm (series 2, image 55). No gallstones, gallbladder wall thickening, or biliary dilatation. Pancreas: Unremarkable. No pancreatic ductal dilatation or surrounding inflammatory changes. Spleen: Normal in size without significant abnormality. Adrenals/Urinary Tract: Adrenal glands are unremarkable. Lobulated renal cortical scarring. Simple, benign renal cortical cysts of the inferior pole of the right kidney, for which no further follow-up or characterization is required. Bladder is unremarkable. Stomach/Bowel: Unchanged, treated mass of the lesser curvature of the stomach measuring approximately 7.1 x 2.9 cm (series 2, image 53). Appendix appears normal. No evidence of bowel wall thickening, distention, or inflammatory changes. Vascular/Lymphatic: Aortic atherosclerosis. No enlarged abdominal or pelvic lymph nodes. Reproductive: No mass or other abnormality. Other: No abdominal wall hernia or abnormality. Significantly increased, large volume ascites throughout the abdomen and pelvis, with stranding of the omentum and peritoneum (series 2, image 54)  Musculoskeletal: No acute osseous findings. IMPRESSION: 1. Unchanged, treated mass of the lesser curvature of the stomach, in keeping with known gastric malignancy. 2. Unchanged, very subtle liver metastases.  No new liver lesions. 3. Significantly increased, large volume ascites throughout the abdomen and pelvis, with stranding of the omentum and peritoneum, suspicious for peritoneal metastatic disease but without overt nodularity. 4. No evidence of metastatic disease in the chest. Aortic Atherosclerosis (ICD10-I70.0) and Emphysema (ICD10-J43.9). Electronically Signed   By: Delanna Ahmadi M.D.   On: 03/01/2022 14:41    Procedures Procedures    Medications Ordered in ED Medications  sodium chloride 0.9 % bolus 1,000 mL (0 mLs Intravenous Stopped 03/01/22 1628)    ED Course/ Medical Decision Making/ A&P                           Medical Decision Making Amount and/or Complexity of Data Reviewed Labs: ordered.  Risk Decision regarding hospitalization.   This patient presents to the ED for concern of ams, this involves an extensive number of treatment options, and is a complaint that carries with it a high risk of complications and morbidity.  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  The pertinent results include:  cbc with hgb 10.5 (chronic), cmp wnl other than elevated lfts, mg 2.0, trop 50   Imaging Studies ordered:  I ordered imaging studies including ct chest/abd/pelvis and [place] legs I independently visualized and interpreted imaging which showed   1.  Unchanged, treated mass of the  lesser curvature of the stomach, in keeping with known gastric malignancy. 2. Unchanged, very subtle liver metastases.  No new liver lesions. 3. Significantly increased, large volume ascites throughout the abdomen and pelvis, with stranding of the omentum and peritoneum, suspicious for peritoneal metastatic disease but without overt nodularity. 4. No evidence of metastatic disease in the chest. Aortic Atherosclerosis (ICD10-I70.0) and Emphysema  [place] for DVT:  neg I agree with the radiologist interpretation   Cardiac Monitoring:  The patient was maintained on a cardiac monitor.  I personally viewed and interpreted the cardiac monitored which showed an underlying rhythm of: nsr   Medicines ordered and prescription drug management:  I ordered medication including ivfs  for dehydration  Reevaluation of the patient after these medicines showed that the patient improved I have reviewed the patients home medicines and have made adjustments as needed  Critical Interventions:  IVFs   Consultations Obtained:  I requested consultation with the hospitalists (Dr. Cyndia Skeeters),  and discussed lab and imaging findings as well as pertinent plan - he will admit   Problem List / ED Course:  Metastatic gastric cancer:  pt with failure to thrive.  He has not been eating or drinking and is so weak that he can't get up.  Will need admission for fluids and for pt/ot. Ascites:  may need another tap.     Reevaluation:  After the interventions noted above, I reevaluated the patient and found that they have :improved   Social Determinants of Health:  Lives at home with wife.  [name] speaker.   Dispostion:  After consideration of the diagnostic results and the patients response to treatment, I feel that the patent would benefit from admission.          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

## 2022-03-01 NOTE — H&P (Signed)
History and Physical    Patient: Jimmy Blankenship FHL:[id number] DOB: [birth year] DOA: 03/01/2022 DOS: the patient was seen and examined on 03/01/2022 PCP: [name], MD  Patient coming from: Home  Chief Complaint:  Chief Complaint  Patient presents with   Weakness   HPI: Jimmy Blankenship is a 70 y.o. 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  History obtained with the use of phone interpreter.  Patient's wife at bedside.  Patient presented for outpatient CT scan ordered by his oncologist but went to ED due to generalized weakness and ascites.  Patient reports generalized weakness for about 5 to 6 days.  He has had difficulty ambulating due to weakness in both legs.  He feels fatigued and short of breath.  He denies pain,, fever, chills, cough, nausea and vomiting.  He had constipation that turned into diarrhea after he used bowel regimen.  He denies dysuria, frequency or urgency but admits to hesitancy.  He denies changes to oral intake although his wife states he has been eating a little bit at a time.  Denies melena or hematochezia.  Reports good compliance with his medication.  Patient lives with his wife.  Uses walker and cane at baseline.  Denies smoking cigarette, drinking alcohol or recreational drug use.  He confirms DNR/DNI.  In ED, stable vitals. Cr 1.2 (about baseline).  AST 408.  ALT 194.  Total bili 1.8.  ALP 194.  Troponin 50.  Hgb 10.5 (about baseline).  EKG features sinus rhythm.  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  Lower extremity venous Doppler ordered.  Patient  was given normal saline bolus and hospitalist service called for admission for failure to thrive   Review of Systems: As mentioned in the history of present illness. All other systems reviewed and are negative. Past Medical History:  Diagnosis Date   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[place]) 08/22/2020   CKD (chronic kidney disease)    Coronary Artery Disease s/p CABG    hx of PCI in 2000s in [place] // s/p NSTEMI in 7/21 >> CABG   Diabetes mellitus 2    Goals of care, counseling/discussion 08/22/2020   Heart failure with reduced ejection fraction    Ischemic CM // Echo 7/21 - EF 35 // Intraop TEE 7/21: EF 40-45 // Echocardiogram 9/21: apical AK, EF 40-45, Gr 2 DD, trace AI, mild MR, mild LAE, normal RVSF, no pericardial effusion   High cholesterol    Hypertension    Iron deficiency anemia    Heme + stools (during admit for NSTEMI in 7/21)   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 ([place]) 08/22/2020   Palpitations    Event monitor 10/21: NSR, rare PVCs, rare 4 beats NSVT, rare brief non-sustained SVT   Past Surgical History:  Procedure Laterality Date   BIOPSY  08/15/2020   Procedure: BIOPSY;  Surgeon: Ladene Artist, MD;  Location: [facility];  Service: Gastroenterology;;   BIOPSY  12/29/2021   Procedure: BIOPSY;  Surgeon: Jackquline Denmark, MD;  Location: [facility];  Service: Gastroenterology;;   BIOPSY  02/10/2022   Procedure: BIOPSY;  Surgeon: Lavena Bullion, DO;  Location: [facility];  Service: Gastroenterology;;   COLONOSCOPY     around 2013. Normal per patient. [place]   COLONOSCOPY N/A 08/15/2020   Procedure: COLONOSCOPY;  Surgeon: Ladene Artist, MD;  Location: [facility] [place] [facility];  Service: Gastroenterology;  Laterality: N/A;   CORONARY ANGIOPLASTY WITH STENT PLACEMENT     CORONARY ARTERY BYPASS GRAFT N/A 03/24/2020   Procedure: CORONARY ARTERY BYPASS GRAFTING (CABG), ON PUMP, TIMES THREE, USING LEFT INTERNAL MAMMARY ARTERY AND RIGHT  ENDOSCOPICALLY HARVESTED GREATER SAPHENOUS VEIN;  Surgeon: Ivin Poot, MD;  Location: [facility];  Service: Open Heart Surgery;  Laterality: N/A;   ESOPHAGOGASTRODUODENOSCOPY N/A 08/15/2020   Procedure: ESOPHAGOGASTRODUODENOSCOPY (EGD);  Surgeon: Ladene Artist, MD;  Location: [facility];  Service: Gastroenterology;  Laterality: N/A;   ESOPHAGOGASTRODUODENOSCOPY (EGD) WITH PROPOFOL N/A 12/29/2021   Procedure: ESOPHAGOGASTRODUODENOSCOPY (EGD) WITH PROPOFOL;  Surgeon: Jackquline Denmark, MD;  Location: [facility];  Service: Gastroenterology;  Laterality: N/A;   ESOPHAGOGASTRODUODENOSCOPY (EGD) WITH PROPOFOL N/A 02/10/2022   Procedure: ESOPHAGOGASTRODUODENOSCOPY (EGD) WITH PROPOFOL;  Surgeon: Lavena Bullion, DO;  Location: [facility];  Service: Gastroenterology;  Laterality: N/A;   IR IMAGING GUIDED PORT INSERTION  09/16/2020   IR PARACENTESIS  02/23/2022   LEFT HEART CATH AND CORONARY ANGIOGRAPHY N/A 03/17/2020   Procedure: LEFT HEART CATH AND CORONARY ANGIOGRAPHY;  Surgeon: Belva Crome, MD;  Location: [facility];  Service: Cardiovascular;  Laterality: N/A;   POLYPECTOMY  08/15/2020   Procedure: POLYPECTOMY;  Surgeon: Ladene Artist, MD;  Location: [facility];  Service: Gastroenterology;;   TEE WITHOUT CARDIOVERSION N/A 03/24/2020   Procedure: TRANSESOPHAGEAL ECHOCARDIOGRAM (TEE);  Surgeon: [name], Collier Salina, MD;  Location: [facility];  Service: Open Heart Surgery;  Laterality: N/A;   Social History:  reports that he quit smoking about 17 years ago. His smoking use included cigarettes. He has a 30.00 pack-year smoking history. He has never used smokeless tobacco. He reports that he does not drink alcohol and does not use drugs.  No Known Allergies  Family History  Problem Relation Age of Onset   Atrial fibrillation Mother    Congestive Heart Failure Mother    Cancer Father     Prior to Admission medications   Medication Sig Start Date End Date Taking? Authorizing Provider   atorvastatin (LIPITOR) 40 MG tablet Take 80 mg by mouth daily.    [provider]  bisacodyl (DULCOLAX) 10 MG suppository Place 1 suppository (10 mg total) rectally as needed for moderate constipation. 02/14/22   Debbe Odea, MD  capecitabine (XELODA) 500 MG tablet Take 3 tablets (1,500 mg total) by mouth daily after breakfast. Take only on days of radiation Monday through Friday. 02/22/22   Volanda Napoleon, MD  dexamethasone (DECADRON) 4 MG tablet Take 2 tablets (8 mg total) by mouth daily. Start the day after chemotherapy for 2 days. Patient not taking: Reported on 02/21/2022 02/01/21   Volanda Napoleon, MD  diphenhydrAMINE (BENADRYL) 25 MG tablet Take 25 mg by mouth at bedtime as needed for itching.    [provider]  diphenoxylate-atropine (LOMOTIL) 2.5-0.025 MG tablet TAKE ONE TABLET BY MOUTH FOUR TIMES A DAY AS NEEDED FOR DIARRHEA OR LOOSE STOOLS Patient taking differently: Take 1 tablet by mouth 4 (four) times daily as needed for diarrhea or loose stools. 10/31/21   Volanda Napoleon, MD  docusate sodium (COLACE) 100 MG capsule Take 1 capsule (100 mg total) by mouth 2 (two) times daily. 02/14/22   Debbe Odea, MD  feeding  supplement (ENSURE ENLIVE / ENSURE PLUS) LIQD Take 237 mLs by mouth 2 (two) times daily between meals. Patient taking differently: Take 237 mLs by mouth daily. 08/16/20   Regalado, Belkys A, MD  furosemide (LASIX) 20 MG tablet Take 1 tablet (20 mg total) by mouth daily as needed (for abdominal swelling (ascites)). Only take if you have treated constipation first. 02/14/22 02/14/23  Debbe Odea, MD  gabapentin (NEURONTIN) 300 MG capsule Take 1 capsule (300 mg total) by mouth 4 (four) times daily. 10/31/21   Volanda Napoleon, MD  insulin glargine (LANTUS) 100 UNIT/ML Solostar Pen Inject into the skin.    [provider]  lidocaine-prilocaine (EMLA) cream Apply to affected area once Patient taking differently: 1 application  daily as needed (port access).  02/01/21   Volanda Napoleon, MD  LORazepam (ATIVAN) 0.5 MG tablet Take 1 tablet (0.5 mg total) by mouth every 6 (six) hours as needed (Nausea or vomiting). 02/01/21   Volanda Napoleon, MD  megestrol (MEGACE) 400 MG/10ML suspension Take 10 mLs (400 mg total) by mouth 2 (two) times daily. Patient taking differently: Take 400 mg by mouth 2 (two) times daily as needed (appetite). 02/21/21   Volanda Napoleon, MD  melatonin 5 MG TABS Take 5 mg by mouth at bedtime.    [provider]  metoprolol tartrate (LOPRESSOR) 25 MG tablet Take 0.5 tablets (12.5 mg total) by mouth 2 (two) times daily. 02/14/22 03/16/22  Debbe Odea, MD  ondansetron (ZOFRAN) 8 MG tablet Take 1 tablet (8 mg total) by mouth 2 (two) times daily as needed for refractory nausea / vomiting. Start on day 3 after chemo. 02/01/21   Volanda Napoleon, MD  pantoprazole (PROTONIX) 40 MG tablet Take 1 tablet (40 mg total) by mouth 2 (two) times daily. Follow up with gastroenterology for additional instructions 12/30/21 02/28/22  Elodia Florence., MD  polyethylene glycol (MIRALAX / GLYCOLAX) 17 g packet Take 17 g by mouth daily as needed for mild constipation. 02/14/22   Debbe Odea, MD  prochlorperazine (COMPAZINE) 10 MG tablet Take 1 tablet (10 mg total) by mouth every 6 (six) hours as needed (Nausea or vomiting). Patient taking differently: Take 10 mg by mouth every 6 (six) hours as needed for nausea or vomiting (Nausea or vomiting). 02/01/21   Volanda Napoleon, MD  sucralfate (CARAFATE) 1 g tablet Take 1 tablet (1 g total) by mouth 4 (four) times daily -  with meals and at bedtime for 14 days. 12/30/21 01/13/22  Elodia Florence., MD  tamsulosin (FLOMAX) 0.4 MG CAPS capsule Take 0.4 mg by mouth at bedtime. 08/07/21   [provider]    Physical Exam: Vitals:   03/01/22 1600 03/01/22 1615 03/01/22 1630 03/01/22 1645  BP: 118/63 120/68 113/87 121/68  Pulse: 98 98 100 100  Resp: 19  18 (!) 22  Temp:      TempSrc:      SpO2:  91% 91% 91% 93%  Weight:      Height:       GENERAL: No apparent distress.  Nontoxic. HEENT: MMM.  Vision and hearing grossly intact.  NECK: Supple.  No apparent JVD.  RESP:  No IWOB.  Fair aeration bilaterally. CVS:  RRR. Heart sounds normal.  ABD/GI/GU: BS+.  Abdomen is slightly distended.  Not dull to percussion.  No tenderness. MSK/EXT:  Moves extremities. No apparent deformity. No edema.  SKIN: no apparent skin lesion or wound NEURO: Awake and alert. Oriented appropriately.  No apparent focal neuro deficit. PSYCH: Calm. Normal affect.   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  No clinical signs of SBP.  Patient had paracentesis with removal of 4.4 L on 6/16.  -Followed by Dr. Marin Olp.  On the [place].  It seems like he used to start radiation on 6/29 -IR paracentesis with fluid study ordered -Added oncology to treatment team  Elevated liver enzymes/hyperbilirubinemia: Significantly increased when compared to his recent value on 6/13.  Pattern consistent with rhabdo alcohol but patient denies drinking alcohol.  No trauma or fall. Recent Labs  Lab 03/01/22 1423  AST 408*  ALT 194*   ALKPHOS 192*  BILITOT 1.8*  PROT 5.3*  ALBUMIN 2.9*  -Check CK -Consider RUQ [place] if no improvement.  IDA due to chronic blood loss anemia/upper GI bleed/gastritis:   Recent EGD on 6/8 showed gastritis.H&H stable.  -Continue home Protonix after med rec -Monitor H&H  Elevated troponin: Likely demand.  Denies chest pain.  No acute ischemic finding on EKG. CAD s/p CABG: Stable -Continue home meds after med rec.  Chronic combined CHF: TTE on 6/2 with LVEF of 35 to 40%, G1 DD, RWMA, severe LAE and normal RVSP.  Appears euvolemic on exam except for abdominal ascites.  On Lasix 20 mg as needed -Continue home Lasix after med rec -Strict intake and output -Daily weight -Monitor renal functions.  Controlled IDDM-2: A1c 7.0% on 12/29/2021.  Seems to be on Lantus at home. -SSI-sensitive   BPH with hesitancy -Continue home tamsulosin  Goal of care counseling: DNR/DNI confirmed and appropriate.  Seems to have failure to thrive with stage IV gastric cancer with concern for peritoneal metastasis -Consult palliative care   Advance Care Planning:   Code Status: DNR   Consults: Oncology  Family Communication: Updated patient's wife at bedside  Severity of Illness: The appropriate patient status for this patient is OBSERVATION. Observation status is judged to be reasonable and necessary in order to provide the required intensity of service to ensure the patient's safety. The patient's presenting symptoms, physical exam findings, and initial radiographic and laboratory data in the context of their medical condition is felt to place them at decreased risk for further clinical deterioration. Furthermore, it is anticipated that the patient will be medically stable for discharge from the hospital within 2 midnights of admission.   Author: Mercy Riding, MD 03/01/2022 5:17 PM  For on call review [email or web address].

## 2022-03-01 NOTE — ED Triage Notes (Signed)
Pt arrives from CT for outpatient scan with interpreter at bedside. Pt has hx of cancer and progressive weakness over the last week. Pt denies pain, SOB, fevers, N/V.

## 2022-03-01 NOTE — Progress Notes (Signed)
BLE venous duplex has been completed.  Preliminary results given to Dr. Gilford Raid.   Results can be found under chart review under CV PROC. 03/01/2022 4:30 PM Aly Hauser RVT, RDMS

## 2022-03-02 ENCOUNTER — Observation Stay (HOSPITAL_COMMUNITY): Payer: Medicare Other

## 2022-03-02 ENCOUNTER — Ambulatory Visit (HOSPITAL_COMMUNITY)
Admission: RE | Admit: 2022-03-02 | Discharge: 2022-03-02 | Disposition: A | Discharge status: Home / Self Care | Payer: Medicare Other | Source: Ambulatory Visit | Attending: Hematology & Oncology | Admitting: Hematology & Oncology

## 2022-03-02 ENCOUNTER — Encounter (HOSPITAL_COMMUNITY): Payer: Self-pay | Admitting: Student

## 2022-03-02 DIAGNOSIS — D696 Thrombocytopenia, unspecified: Secondary | ICD-10-CM

## 2022-03-02 DIAGNOSIS — R531 Weakness: Secondary | ICD-10-CM | POA: Diagnosis not present

## 2022-03-02 DIAGNOSIS — C169 Malignant neoplasm of stomach, unspecified: Secondary | ICD-10-CM | POA: Diagnosis not present

## 2022-03-02 DIAGNOSIS — R180 Malignant ascites: Secondary | ICD-10-CM | POA: Diagnosis not present

## 2022-03-02 DIAGNOSIS — R627 Adult failure to thrive: Secondary | ICD-10-CM | POA: Diagnosis not present

## 2022-03-02 DIAGNOSIS — E876 Hypokalemia: Secondary | ICD-10-CM

## 2022-03-02 LAB — COMPREHENSIVE METABOLIC PANEL
ALT: 169 U/L — ABNORMAL HIGH (ref 0–44)
AST: 315 U/L — ABNORMAL HIGH (ref 15–41)
Albumin: 2.6 g/dL — ABNORMAL LOW (ref 3.5–5.0)
Alkaline Phosphatase: 182 U/L — ABNORMAL HIGH (ref 38–126)
Anion gap: 7 (ref 5–15)
BUN: 29 mg/dL — ABNORMAL HIGH (ref 8–23)
CO2: 19 mmol/L — ABNORMAL LOW (ref 22–32)
Calcium: 8.6 mg/dL — ABNORMAL LOW (ref 8.9–10.3)
Chloride: 114 mmol/L — ABNORMAL HIGH (ref 98–111)
Creatinine, Ser: 1.19 mg/dL (ref 0.61–1.24)
GFR, Estimated: >60 mL/min (ref >60)
Glucose, Bld: 134 mg/dL — ABNORMAL HIGH (ref 70–99)
Potassium: 3.4 mmol/L — ABNORMAL LOW (ref 3.5–5.1)
Sodium: 140 mmol/L (ref 135–145)
Total Bilirubin: 1.8 mg/dL — ABNORMAL HIGH (ref 0.3–1.2)
Total Protein: 4.6 g/dL — ABNORMAL LOW (ref 6.5–8.1)

## 2022-03-02 LAB — GRAM STAIN

## 2022-03-02 LAB — BODY FLUID CELL COUNT WITH DIFFERENTIAL
Lymphs, Fluid: 36 %
Monocyte-Macrophage-Serous Fluid: 50 % (ref 50–90)
Neutrophil Count, Fluid: 14 % (ref 0–25)
Total Nucleated Cell Count, Fluid: 152 cu mm (ref 0–1000)

## 2022-03-02 LAB — URINALYSIS, ROUTINE W REFLEX MICROSCOPIC
Bilirubin Urine: NEGATIVE
Glucose, UA: NEGATIVE mg/dL
Ketones, ur: NEGATIVE mg/dL
Nitrite: NEGATIVE
Protein, ur: 100 mg/dL — AB
Specific Gravity, Urine: 1.03 (ref 1.005–1.030)
pH: 6 (ref 5.0–8.0)

## 2022-03-02 LAB — GLUCOSE, CAPILLARY
Glucose-Capillary: 114 mg/dL — ABNORMAL HIGH (ref 70–99)
Glucose-Capillary: 170 mg/dL — ABNORMAL HIGH (ref 70–99)

## 2022-03-02 LAB — PROTIME-INR
INR: 1.2 (ref 0.8–1.2)
Prothrombin Time: 15.2 seconds (ref 11.4–15.2)

## 2022-03-02 LAB — CBC
HCT: 27.7 % — ABNORMAL LOW (ref 39.0–52.0)
Hemoglobin: 9 g/dL — ABNORMAL LOW (ref 13.0–17.0)
MCH: 34.6 pg — ABNORMAL HIGH (ref 26.0–34.0)
MCHC: 32.5 g/dL (ref 30.0–36.0)
MCV: 106.5 fL — ABNORMAL HIGH (ref 80.0–100.0)
Platelets: 51 10*3/uL — ABNORMAL LOW (ref 150–400)
RBC: 2.6 MIL/uL — ABNORMAL LOW (ref 4.22–5.81)
RDW: 20.3 % — ABNORMAL HIGH (ref 11.5–15.5)
WBC: 9 10*3/uL (ref 4.0–10.5)
nRBC: 0 % (ref 0.0–0.2)

## 2022-03-02 LAB — APTT: aPTT: 58 seconds — ABNORMAL HIGH (ref 24–36)

## 2022-03-02 LAB — TROPONIN I (HIGH SENSITIVITY): Troponin I (High Sensitivity): 120 ng/L (ref <18)

## 2022-03-02 LAB — ALBUMIN, PLEURAL OR PERITONEAL FLUID: Albumin, Fluid: <1.5 g/dL

## 2022-03-02 LAB — CK: Total CK: 3976 U/L — ABNORMAL HIGH (ref 49–397)

## 2022-03-02 IMAGING — US US PARACENTESIS
1 series · 5 of 5 positions shown · non-contrast
Comparison: none

INDICATION: Patient with history of metastatic gastric adenocarcinoma with
recurrent ascites. Request for therapeutic and diagnostic
paracentesis.

[Series 1: us paracentesis mc & wl · 5 of 5 slices shown]
[im 1/5]
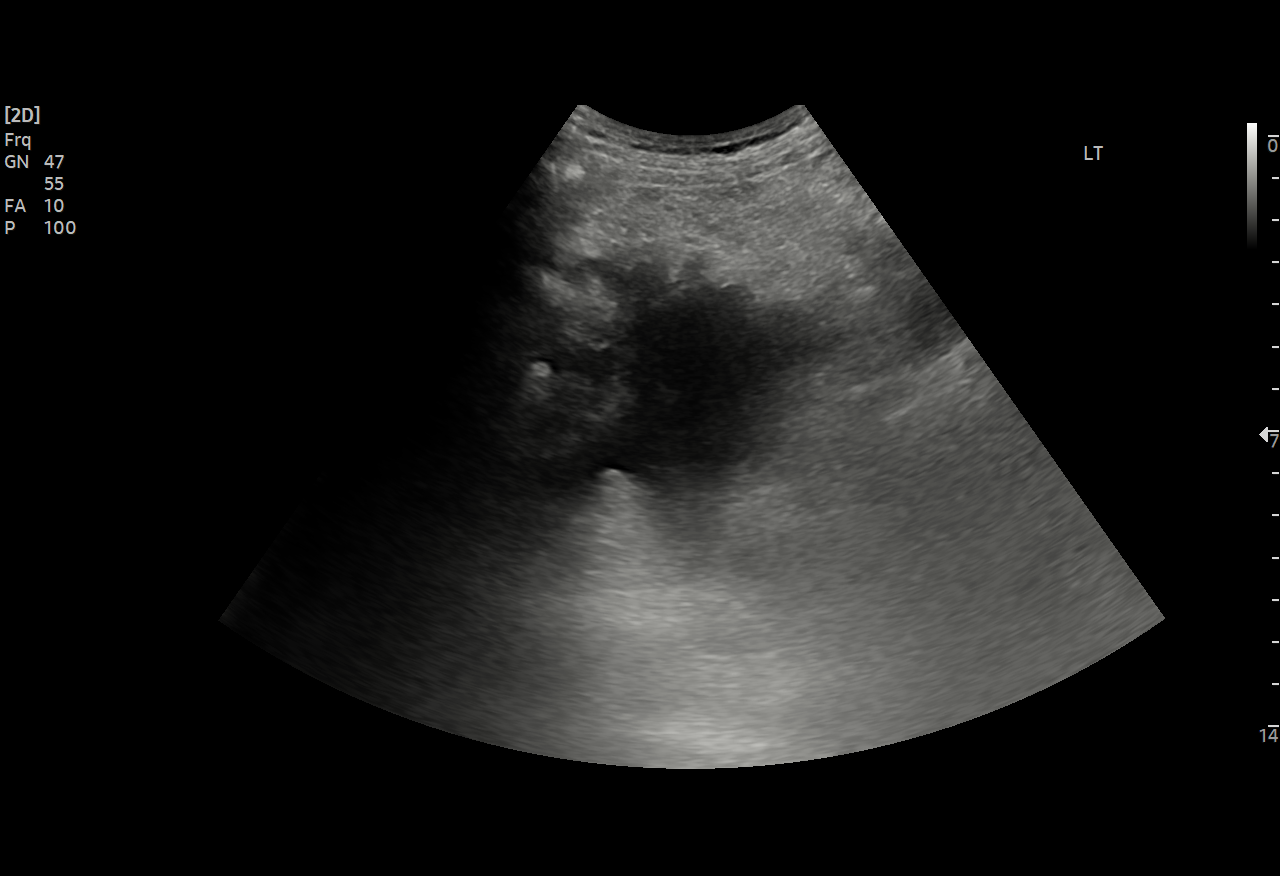
[im 2/5]
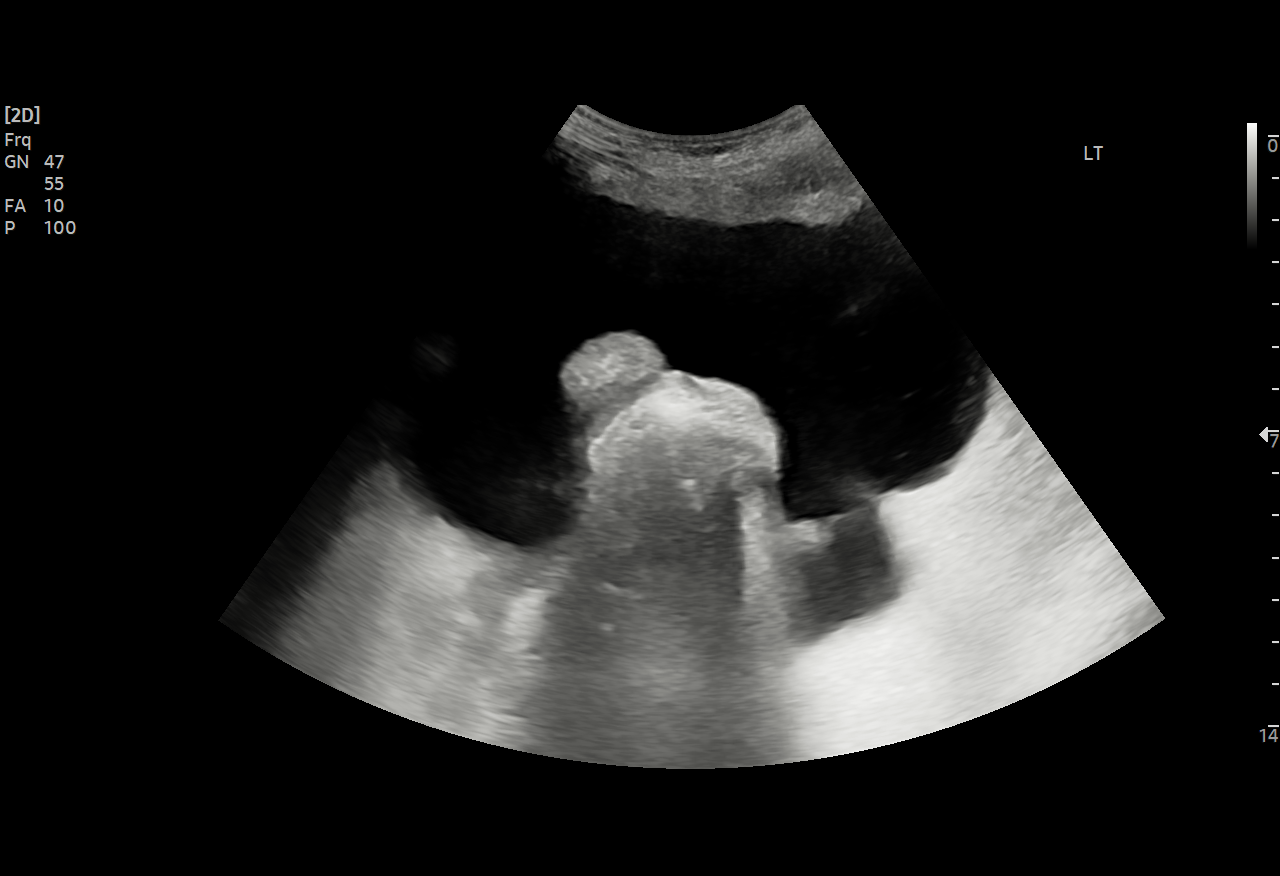
[im 3/5]
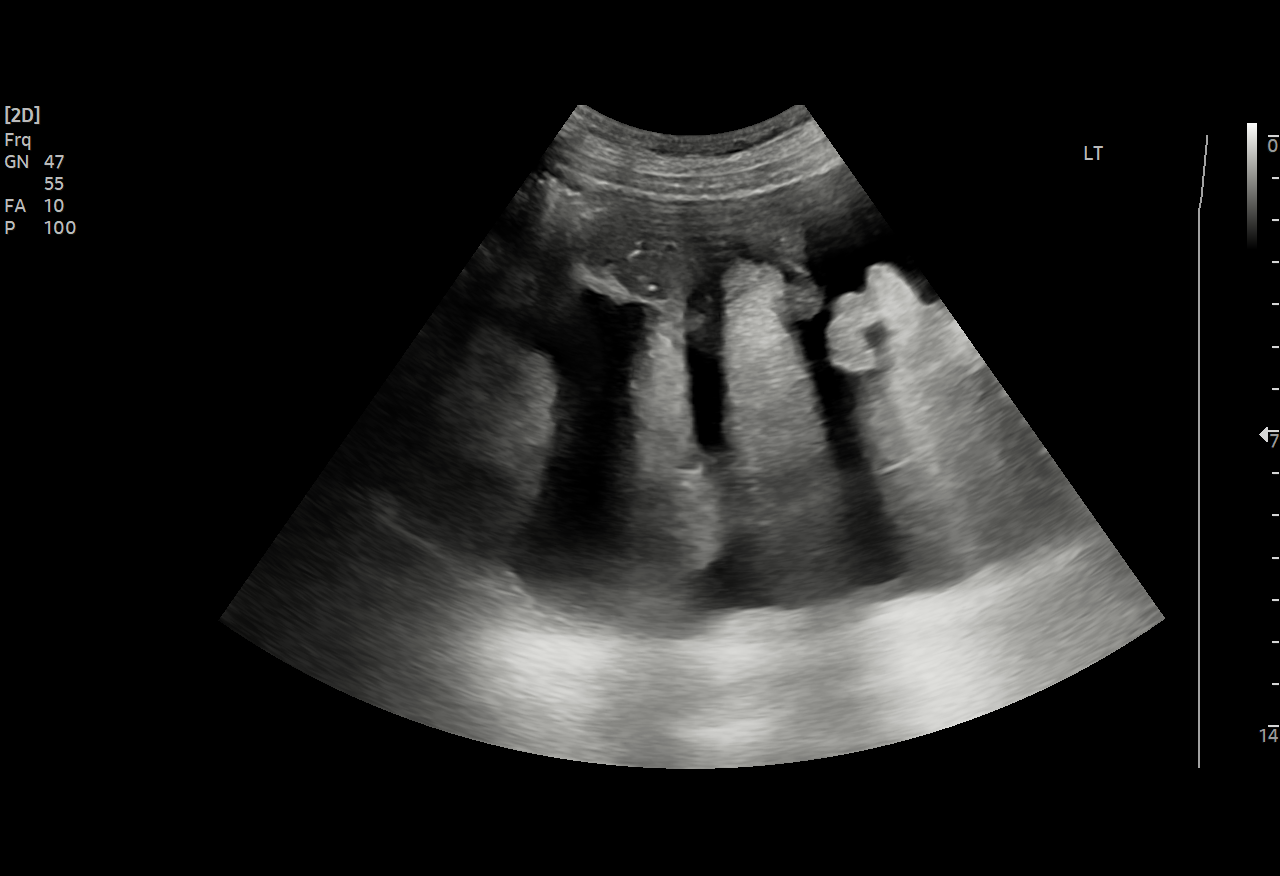
[im 4/5]
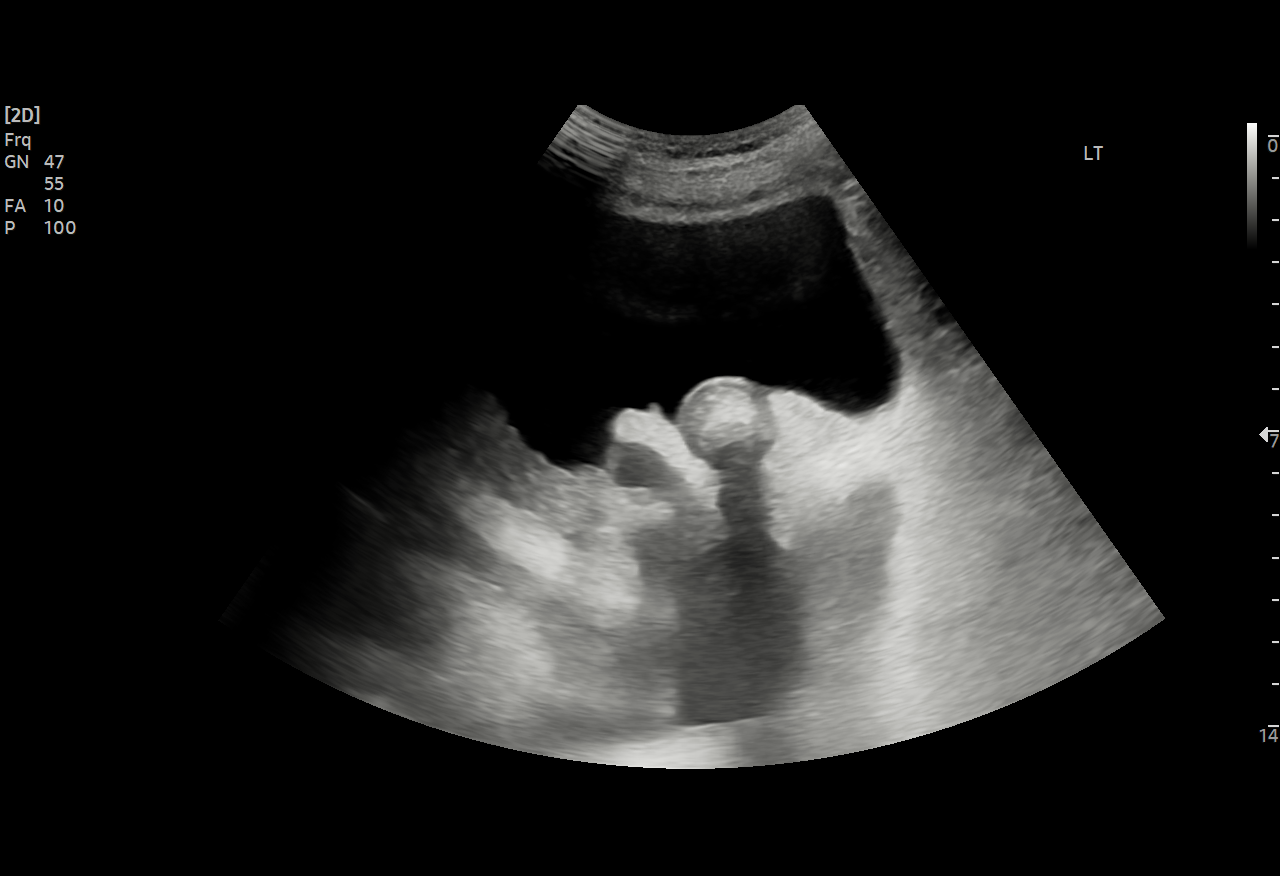
[im 5/5]
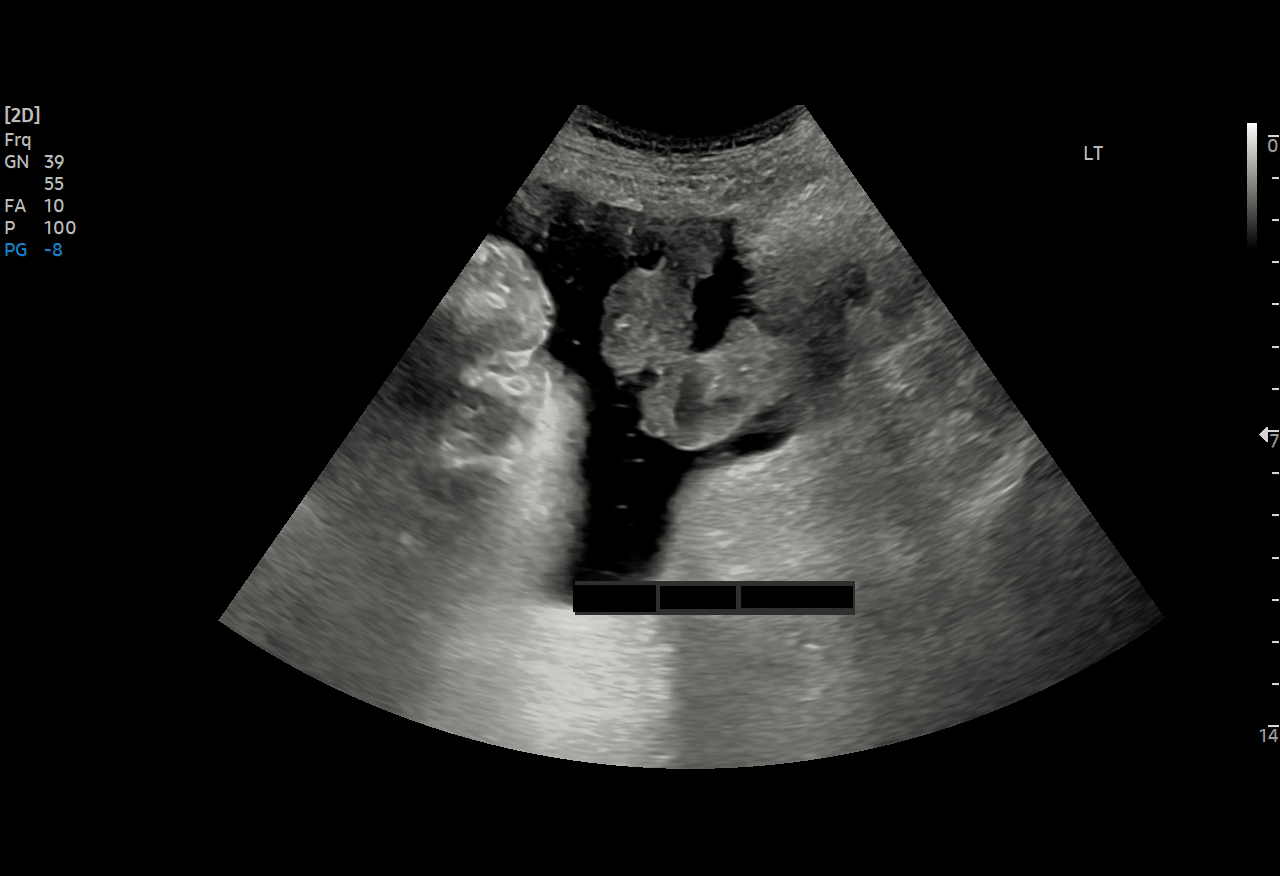

[5 of 5 positions shown; findings below may reference images not displayed]

EXAM:
ULTRASOUND GUIDED  PARACENTESIS

MEDICATIONS:
10 mL 1% lidocaine

COMPLICATIONS:
None immediate.

PROCEDURE:
Informed written consent was obtained from the patient after a
discussion of the risks, benefits and alternatives to treatment. A
timeout was performed prior to the initiation of the procedure.

Initial ultrasound scanning demonstrates a large amount of ascites
within the right lower abdominal quadrant. The right lower abdomen
was prepped and draped in the usual sterile fashion. 1% lidocaine
was used for local anesthesia.

Following this, a 19 gauge, 10-cm, Yueh catheter was introduced. An
ultrasound image was saved for documentation purposes. The
paracentesis was performed. The catheter was removed and a dressing
was applied. The patient tolerated the procedure well without
immediate post procedural complication.
FINDINGS: A total of approximately 3.5 L of hazy amber fluid was removed.
Samples were sent to the laboratory as requested by the clinical
team.
IMPRESSION: Successful ultrasound-guided paracentesis yielding 3.5 liters of
peritoneal fluid.

Determined by

## 2022-03-02 MED ORDER — GUAIFENESIN-DM 100-10 MG/5ML PO SYRP
5.0000 mL | ORAL_SOLUTION | ORAL | Status: DC | PRN
  Administered 2022-03-02 – 2022-03-07 (×5): 5 mL via ORAL
  Filled 2022-03-02 (×5): qty 5

## 2022-03-02 MED ORDER — SODIUM CHLORIDE 0.9% FLUSH
10.0000 mL | INTRAVENOUS | Status: DC | PRN
  Administered 2022-03-06 – 2022-03-07 (×2): 10 mL

## 2022-03-02 MED ORDER — POTASSIUM CHLORIDE 20 MEQ PO PACK
40.0000 meq | PACK | ORAL | Status: AC
  Administered 2022-03-02 (×2): 40 meq via ORAL
  Filled 2022-03-02 (×2): qty 2

## 2022-03-02 MED ORDER — METOPROLOL TARTRATE 12.5 MG HALF TABLET
12.5000 mg | ORAL_TABLET | Freq: Two times a day (BID) | ORAL | Status: DC
  Administered 2022-03-02 – 2022-03-07 (×9): 12.5 mg via ORAL
  Filled 2022-03-02 (×12): qty 1

## 2022-03-02 MED ORDER — LIDOCAINE HCL 1 % IJ SOLN
INTRAMUSCULAR | Status: AC
  Administered 2022-03-02: 10 mL
  Filled 2022-03-02: qty 20

## 2022-03-02 MED ORDER — PANTOPRAZOLE SODIUM 40 MG PO TBEC
40.0000 mg | DELAYED_RELEASE_TABLET | Freq: Two times a day (BID) | ORAL | Status: DC
  Administered 2022-03-02 – 2022-03-08 (×12): 40 mg via ORAL
  Filled 2022-03-02 (×13): qty 1

## 2022-03-02 MED ORDER — GABAPENTIN 300 MG PO CAPS
300.0000 mg | ORAL_CAPSULE | Freq: Three times a day (TID) | ORAL | Status: DC
  Administered 2022-03-02 – 2022-03-06 (×14): 300 mg via ORAL
  Filled 2022-03-02 (×15): qty 1

## 2022-03-02 MED ORDER — CHLORHEXIDINE GLUCONATE CLOTH 2 % EX PADS
6.0000 | MEDICATED_PAD | Freq: Every day | CUTANEOUS | Status: DC
Start: 2022-03-02 — End: 2022-03-08
  Administered 2022-03-02 – 2022-03-08 (×6): 6 via TOPICAL

## 2022-03-02 MED ORDER — ENSURE ENLIVE PO LIQD
237.0000 mL | Freq: Two times a day (BID) | ORAL | Status: DC
  Administered 2022-03-02 – 2022-03-08 (×9): 237 mL via ORAL

## 2022-03-02 MED ORDER — LORAZEPAM 0.5 MG PO TABS: 0.5000 mg | ORAL_TABLET | Freq: Four times a day (QID) | ORAL | Status: DC | PRN

## 2022-03-02 MED ORDER — MELATONIN 5 MG PO TABS
5.0000 mg | ORAL_TABLET | Freq: Every day | ORAL | Status: DC
  Administered 2022-03-02 – 2022-03-07 (×5): 5 mg via ORAL
  Filled 2022-03-02 (×6): qty 1

## 2022-03-02 NOTE — Evaluation (Signed)
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) 08/22/2020   CKD (chronic kidney disease)    Coronary Artery Disease s/p CABG    hx of PCI in 2000s in [place] // s/p NSTEMI in 7/21 >> CABG   Diabetes mellitus 2    Goals of care, counseling/discussion 08/22/2020   Heart failure with reduced ejection fraction    Ischemic CM // Echo 7/21 - EF 35 // Intraop TEE 7/21: EF 40-45 // Echocardiogram 9/21: apical AK, EF 40-45, Gr 2 DD, trace AI, mild MR, mild LAE, normal RVSF, no pericardial effusion   High cholesterol    Hypertension    Iron deficiency anemia    Heme + stools (during admit for NSTEMI in 7/21)   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 (HCC) 08/22/2020   Palpitations    Event monitor 10/21: NSR, rare PVCs, rare 4 beats NSVT, rare brief non-sustained SVT   Past Surgical History:  Past Surgical History:  Procedure Laterality Date   BIOPSY  08/15/2020   Procedure: BIOPSY;  Surgeon: Meryl Dare, MD;  Location: [facility] ENDOSCOPY;  Service: Gastroenterology;;   BIOPSY  12/29/2021   Procedure: BIOPSY;  Surgeon: Lynann Bologna, MD;  Location: [facility] ENDOSCOPY;  Service: Gastroenterology;;   BIOPSY  02/10/2022   Procedure: BIOPSY;  Surgeon: Shellia Cleverly, DO;  Location: [facility] ENDOSCOPY;  Service: Gastroenterology;;   COLONOSCOPY     around 2013. Normal per patient. [place]   COLONOSCOPY N/A 08/15/2020   Procedure: COLONOSCOPY;  Surgeon: Meryl Dare, MD;  Location: [facility] ENDOSCOPY;  Service: Gastroenterology;  Laterality: N/A;   CORONARY ANGIOPLASTY WITH STENT PLACEMENT     CORONARY ARTERY BYPASS GRAFT N/A 03/24/2020   Procedure:  CORONARY ARTERY BYPASS GRAFTING (CABG), ON PUMP, TIMES THREE, USING LEFT INTERNAL MAMMARY ARTERY AND RIGHT ENDOSCOPICALLY HARVESTED GREATER SAPHENOUS VEIN;  Surgeon: Kerin Perna, MD;  Location: [facility] OR;  Service: Open Heart Surgery;  Laterality: N/A;   ESOPHAGOGASTRODUODENOSCOPY N/A 08/15/2020   Procedure: ESOPHAGOGASTRODUODENOSCOPY (EGD);  Surgeon: Meryl Dare, MD;  Location: [facility] ENDOSCOPY;  Service: Gastroenterology;  Laterality: N/A;   ESOPHAGOGASTRODUODENOSCOPY (EGD) WITH PROPOFOL N/A 12/29/2021   Procedure: ESOPHAGOGASTRODUODENOSCOPY (EGD) WITH PROPOFOL;  Surgeon: Lynann Bologna, MD;  Location: [facility] ENDOSCOPY;  Service: Gastroenterology;  Laterality: N/A;   ESOPHAGOGASTRODUODENOSCOPY (EGD) WITH PROPOFOL N/A 02/10/2022   Procedure: ESOPHAGOGASTRODUODENOSCOPY (EGD) WITH PROPOFOL;  Surgeon: Shellia Cleverly, DO;  Location: [facility] ENDOSCOPY;  Service: Gastroenterology;  Laterality: N/A;   IR IMAGING GUIDED PORT INSERTION  09/16/2020   IR PARACENTESIS  02/23/2022   LEFT HEART CATH AND CORONARY ANGIOGRAPHY N/A 03/17/2020   Procedure: LEFT HEART CATH AND CORONARY ANGIOGRAPHY;  Surgeon: Lyn Records, MD;  Location: [facility] INVASIVE CV LAB;  Service: Cardiovascular;  Laterality: N/A;   POLYPECTOMY  08/15/2020   Procedure: POLYPECTOMY;  Surgeon: Meryl Dare, MD;  Location: [facility] ENDOSCOPY;  Service: Gastroenterology;;   TEE WITHOUT CARDIOVERSION N/A 03/24/2020   Procedure: TRANSESOPHAGEAL ECHOCARDIOGRAM (TEE);  Surgeon: Donata Clay, Theron Arista, MD;  Location: [facility] OR;  Service: Open Heart Surgery;  Laterality: N/A;   HPI:  a 71 y.o. 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 CT abdomen 03/01/2022 Now readmitted, Ascities, Underwent repeat EGD on 02/10/2022.  Unchanged, treated mass of the lesser  curvature of the stomach,  keeping with known gastric malignancy.  2. Unchanged, very subtle liver metastases.  No new liver lesions.  3. Significantly increased, large volume ascites throughout the  abdomen and pelvis, with stranding of the omentum and peritoneum,  suspicious for peritoneal metastatic disease but without overt  nodularity.    Assessment / Plan / Recommendation  Clinical Impression  Use of interpreter Jasmine December, number [id number] during entire 75 minute session, evaluation and interview conducted. Patient presents with clinical indications concerning for potential pharyngeal and/or esophageal in nature c/b multiple swallows across all consistencies and cough post-swallow. He reports apple juice easier to swallow than Ensure or water - suspect slight increase in viscocity improves clearance/prevents potential airway infiltration. Potential etiology includes deconditioning, side effects of chemo, potential oral candidiasis.  Md assessed tongue and reports pt doe snot have oral candidiasis.  Pt isolates symptoms to pharynx but can not rule out distal esophageal source - given h/o reflux. He reports progressive cough more so during intake than without over the last 2 months.   Given options, he and his wife would like to proceed with swallow evaluation in xray - which we can likely do tomorrow.     SLP Visit Diagnosis: Dysphagia, unspecified (R13.10)    Aspiration Risk  Mild aspiration risk;Risk for inadequate nutrition/hydration    Diet Recommendation Dysphagia 3 (Mech soft);Thin liquid   Liquid Administration via: Cup;No straw Medication Administration: Crushed with puree Supervision: Patient able to self feed Compensations: Slow rate;Small sips/bites;Multiple dry swallows after each bite/sip Postural Changes: Seated upright at 90 degrees;Remain upright for at least 30 minutes after po intake    Other  Recommendations Oral Care Recommendations: Oral care BID    Recommendations for  follow up therapy are one component of a multi-disciplinary discharge planning process, led by the attending physician.  Recommendations may be updated based on patient status, additional functional criteria and insurance authorization.  Follow up Recommendations Follow physician's recommendations for discharge plan and follow up therapies      Assistance Recommended at Discharge Other (comment)  Functional Status Assessment Patient has had a recent decline in their functional status and/or demonstrates limited ability to make significant improvements in function in a reasonable and predictable amount of time  Frequency and Duration min 1 x/week  1 week       Prognosis Prognosis for Safe Diet Advancement: Fair Barriers to Reach Goals: Time post onset Barriers/Prognosis Comment: medical prognosis      Swallow Study   General Date of Onset: 03/02/22 HPI: a 71 y.o. 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 CT abdomen 03/01/2022 Now readmitted, Ascities, Underwent repeat EGD on 02/10/2022.  Unchanged, treated mass of the lesser curvature of the stomach,  keeping with known gastric malignancy.  2. Unchanged, very subtle liver metastases.  No new liver lesions.  3. Significantly increased, large volume ascites throughout the  abdomen and pelvis, with stranding of the omentum and peritoneum,  suspicious for peritoneal metastatic disease but without overt  nodularity. Type of Study: Bedside Swallow  Evaluation Previous Swallow Assessment: none completed, endoscopy complete Diet Prior to this Study: NPO Temperature Spikes Noted: No Respiratory Status: Room air History of Recent Intubation: No Behavior/Cognition: Alert;Cooperative;Pleasant mood Oral Cavity Assessment:  Dry Oral Care Completed by SLP: No Oral Cavity - Dentition: Adequate natural dentition Vision: Functional for self-feeding Self-Feeding Abilities: Able to feed self Patient Positioning: Upright in bed Baseline Vocal Quality: Normal Volitional Cough: Strong Volitional Swallow: Unable to elicit    Oral/Motor/Sensory Function Overall Oral Motor/Sensory Function: Within functional limits   Ice Chips Ice chips: Not tested Other Comments: pt prefers his drinks at room temperature   Thin Liquid Thin Liquid: Impaired Presentation: Cup;Self Fed Oral Phase Impairments: Reduced lingual movement/coordination Pharyngeal  Phase Impairments: Cough - Delayed;Multiple swallows Other Comments: throat clearing observed with 2/5 bolues, multiple swallows across 2/5 boluses and cough noted with 3/5 boluses; inconsistent presentation    Nectar Thick Nectar Thick Liquid: Impaired Presentation: Cup;Self Fed Pharyngeal Phase Impairments: Multiple swallows;Cough - Delayed Other Comments: Ensure with sequential swallows, pt reports sensation of retention and demonstrates moderate cough post-swallow without expectoration   Honey Thick Honey Thick Liquid: Not tested   Puree Puree: Impaired Presentation: Self Fed;Spoon Pharyngeal Phase Impairments: Multiple swallows Other Comments: two boluses of applesauce consumed with pt demonstrating 3 swallows with delayed cough with 1/2 boluses   Solid     Solid: Within functional limits Presentation: Self Fed      Chales Abrahams 03/02/2022,3:52 PM Rolena Infante, MS [facility] SLP Acute Rehab Services Office [phone] Pager [phone]

## 2022-03-02 NOTE — Consult Note (Addendum)
Consultation Note Date: 03/03/2022  Patient Name: Jimmy Blankenship  DOB: 1950/09/23  MRN: [id number]  Age / Sex: 71 y.o., male  PCP: [name], MD Referring Physician: Mercy Riding, MD  Reason for Consultation: Establishing goals of care  HPI/Patient Profile: 71 y.o. male  with past medical history of stage IV gastric cancer with mets to liver (follows with Dr. Marin Olp; on Bobbye [place]; plans to start radiation therapy), UGIB, gastritis, duodenitis, ascites, combined CHF EF 35-40%, CAD/CABG, HTN, diabetes, BPH admitted on 03/01/2022 with weakness and failure to thrive with concern for peritoneal mets. Hospitalized 6/1-6/9 with upper GIB with weakness and abd distention. S/P RLQ abd paracentesis 3.5L on 6/23.   Clinical Assessment and Goals of Care: I have reviewed records from hospitalization including oncology note consultation. Unfortunately Jimmy Blankenship has had sudden decline limiting his ability to proceed with treatment for his cancer. At bedside is Jimmy Blankenship, his wife Ms. Romilda Garret, and daughter Judson Roch. Judson Roch speaks with me outside room. Dell Ponto shares that she has spoken with the doctors and that she and her parents understand the poor prognosis. I explained the role of palliative care to provide support to patient's and families and to assist to optimize quality of life. Sarah and I discuss the priority of comfort for her father and thankfully he has not reported any discomforts at this time. Judson Roch shares that her daughter is starting at the [employer] and she will be taking her Tuesday and will not return until Friday. Sarah requests that we do all we can to maintain her father as well as keep him comfortable until her return. I spoke with her about optimizing him the best we can while hospitalized. I spoke with her about transition from the hospital to home with hospice vs hospice facility. Judson Roch shares that her  brother is planning to come from [place] July 5th. I encouraged Judson Roch to speak with her brother about coming sooner if at all possible due to her father's poor prognosis. Judson Roch understands and will speak with him. Judson Roch is very anxious about her father's care while she is out of town.   I went with Judson Roch into her father's room where I spoke with Jimmy Blankenship and Ms. Romilda Garret. Unfortunately interpreter services were declined and they opted to utilize daughter as interpreter at her request. Complicated conversation but goal for comfort and treat what can be treated with interventions that will not cause him pain or suffering. Jimmy Blankenship denies pain, shortness of breath, constipation, insomnia. I encouraged him to let us know if he has any discomforts at all and we can assist to help him feel as good as possible. They seem to understand the poor prognosis. Judson Roch shares that she will speak with her parents about hospice later.   Unfortunately I was unable to make goals any clearer today. I think they all need some time to process this difficult news and we can continue to support and help them develop a plan to care for Jimmy Blankenship. All questions/concerns  addressed to best of my ability. Emotional support provided.   UPDATE: I was called to bedside by RN to speak with son-in-law, Ulice Dash. Ulice Dash has more questions about options and hospice. He reports that they understand more that there are no good options and even if we can provide blood transfusion this will not improve his overall status for very long. They understand that prognosis is very poor regardless of what we do from here unfortunately. We discussed the next steps of hospice facility and then speaking with hospice about if he is eligible for the facility. I explained that hospice liaison would talk with them about any financial responsibilities if they are able to accept him to facility. I did explain that care at hospice facility does not include testing, blood draws, or  IV fluids or treatments. They do not work to try and fix numbers but focus on ensuring that Jimmy Blankenship is comfortable and without pain or distress while trying to maintain his dignity keeping him clean and dry. Ulice Dash expresses understanding. They plan to speak with Jimmy Blankenship and Ms. Hampton Abbot this evening to explain this further. I have offered to assist with conversation but Judson Roch and Ulice Dash wish to have this conversation with them. Ulice Dash will let us know which hospice they would like to work with.   Primary Decision Maker PATIENT    SUMMARY OF RECOMMENDATIONS   - Will need further goals of care conversations - Likely nearing hospice facility eligibility  Code Status/Advance Care Planning: DNR   Symptom Management:  Recurrent ascites: May consider peritoneal drain for comfort??  Palliative Prophylaxis:  Aspiration, Bowel Regimen, Frequent Pain Assessment, Oral Care, and Turn Reposition  Prognosis:  Prognosis poor with progressing cancer and no longer a good candidate for treatments. Worsening labs and may be eligible for hospice facility if comfort care decided.   Discharge Planning: To Be Determined      Primary Diagnoses: Present on Admission:  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 ([place])  IDA due to chronic blood loss/upper GI bleed  History of CAD s/p CABG  Chronic combined CHF  BPH (benign prostatic hyperplasia)  DOE (dyspnea on exertion)  Gastritis and gastroduodenitis   I have reviewed the medical record, interviewed the patient and family, and examined the patient. The following aspects are pertinent.  Past Medical History:  Diagnosis Date   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[place]) 08/22/2020   CKD (chronic kidney disease)    Coronary Artery Disease s/p CABG    hx of PCI in 2000s in [place] // s/p NSTEMI in 7/21 >> CABG   Diabetes mellitus 2    Goals of care, counseling/discussion 08/22/2020   Heart failure with reduced ejection fraction    Ischemic CM // Echo 7/21 - EF 35 //  Intraop TEE 7/21: EF 40-45 // Echocardiogram 9/21: apical AK, EF 40-45, Gr 2 DD, trace AI, mild MR, mild LAE, normal RVSF, no pericardial effusion   High cholesterol    Hypertension    Iron deficiency anemia    Heme + stools (during admit for NSTEMI in 7/21)   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 ([place]) 08/22/2020   Palpitations    Event monitor 10/21: NSR, rare PVCs, rare 4 beats NSVT, rare brief non-sustained SVT   Social History   Socioeconomic History   Marital status: Married    Spouse name: Not on file   Number of children: Not on file   Years of education: Not on file  Highest education level: Not on file  Occupational History   Occupation: Advertising account executive  Tobacco Use   Smoking status: Former    Packs/day: 1.00    Years: 30.00    Total pack years: 30.00    Types: Cigarettes    Quit date: 2006    Years since quitting: 17.4   Smokeless tobacco: Never   Tobacco comments:    quit 2008  Vaping Use   Vaping Use: Never used  Substance and Sexual Activity   Alcohol use: Never   Drug use: Never   Sexual activity: Not on file  Other Topics Concern   Not on file  Social History Narrative   Not on file   Social Determinants of Health   Financial Resource Strain: Not on file  Food Insecurity: Not on file  Transportation Needs: Not on file  Physical Activity: Not on file  Stress: Not on file  Social Connections: Not on file   Family History  Problem Relation Age of Onset   Atrial fibrillation Mother    Congestive Heart Failure Mother    Cancer Father    Scheduled Meds:  Chlorhexidine Gluconate Cloth  6 each Topical Daily   feeding supplement  237 mL Oral BID BM   gabapentin  300 mg Oral TID   insulin aspart  0-5 Units Subcutaneous QHS   insulin aspart  0-9 Units Subcutaneous TID WC   melatonin  5 mg Oral QHS   metoprolol tartrate  12.5 mg Oral BID   pantoprazole  40 mg Oral BID   tamsulosin  0.4 mg Oral QHS    Continuous Infusions: PRN Meds:.acetaminophen **OR** acetaminophen, LORazepam, ondansetron **OR** ondansetron (ZOFRAN) IV, sodium chloride flush No Known Allergies Review of Systems  Constitutional:  Positive for appetite change.  Respiratory:  Negative for shortness of breath.   Gastrointestinal:  Negative for constipation, nausea and vomiting.  Neurological:  Positive for weakness.    Physical Exam Vitals and nursing note reviewed.  Constitutional:      General: He is not in acute distress.    Appearance: He is ill-appearing.  Cardiovascular:     Rate and Rhythm: Normal rate.  Pulmonary:     Effort: No tachypnea, accessory muscle usage or respiratory distress.  Abdominal:     General: There is distension.  Neurological:     Mental Status: He is alert.     Comments: Patient appears oriented but due to lack of official interpreter services I am unable to verify full understanding     Vital Signs: BP (!) 97/57 (BP Location: Left Arm)   Pulse 91   Temp 98.8 F (37.1 C) (Oral)   Resp 18   Ht '5\' 9"'$  (1.753 m)   Wt 68 kg   SpO2 97%   BMI 22.15 kg/m  Pain Scale: 0-10   Pain Score: 0-No pain   SpO2: SpO2: 97 % O2 Device:SpO2: 97 % O2 Flow Rate: .   IO: Intake/output summary: No intake or output data in the 24 hours ending 03/02/22 1306  LBM: Last BM Date : 03/01/22 Baseline Weight: Weight: 68 kg Most recent weight: Weight: 68 kg     Palliative Assessment/Data:     Time In: 1130  Time Total: 75 min  Greater than 50%  of this time was spent counseling and coordinating care related to the above assessment and plan.  Signed by: Vinie Sill, NP Palliative Medicine Team Pager # [phone] (M-F 8a-5p) Team Phone # [phone] (Nights/Weekends)

## 2022-03-03 ENCOUNTER — Other Ambulatory Visit: Payer: Self-pay

## 2022-03-03 DIAGNOSIS — X58XXXA Exposure to other specified factors, initial encounter: Secondary | ICD-10-CM | POA: Diagnosis present

## 2022-03-03 DIAGNOSIS — I2510 Atherosclerotic heart disease of native coronary artery without angina pectoris: Secondary | ICD-10-CM | POA: Diagnosis present

## 2022-03-03 DIAGNOSIS — I959 Hypotension, unspecified: Secondary | ICD-10-CM

## 2022-03-03 DIAGNOSIS — D500 Iron deficiency anemia secondary to blood loss (chronic): Secondary | ICD-10-CM | POA: Diagnosis present

## 2022-03-03 DIAGNOSIS — Z515 Encounter for palliative care: Secondary | ICD-10-CM | POA: Diagnosis not present

## 2022-03-03 DIAGNOSIS — Z66 Do not resuscitate: Secondary | ICD-10-CM | POA: Diagnosis present

## 2022-03-03 DIAGNOSIS — I5042 Chronic combined systolic (congestive) and diastolic (congestive) heart failure: Secondary | ICD-10-CM | POA: Diagnosis present

## 2022-03-03 DIAGNOSIS — E1122 Type 2 diabetes mellitus with diabetic chronic kidney disease: Secondary | ICD-10-CM | POA: Diagnosis present

## 2022-03-03 DIAGNOSIS — Z6823 Body mass index (BMI) 23.0-23.9, adult: Secondary | ICD-10-CM | POA: Diagnosis not present

## 2022-03-03 DIAGNOSIS — Z7189 Other specified counseling: Secondary | ICD-10-CM | POA: Diagnosis not present

## 2022-03-03 DIAGNOSIS — C787 Secondary malignant neoplasm of liver and intrahepatic bile duct: Secondary | ICD-10-CM | POA: Diagnosis present

## 2022-03-03 DIAGNOSIS — R531 Weakness: Secondary | ICD-10-CM | POA: Diagnosis present

## 2022-03-03 DIAGNOSIS — T50995A Adverse effect of other drugs, medicaments and biological substances, initial encounter: Secondary | ICD-10-CM | POA: Diagnosis present

## 2022-03-03 DIAGNOSIS — E876 Hypokalemia: Secondary | ICD-10-CM | POA: Diagnosis present

## 2022-03-03 DIAGNOSIS — M6282 Rhabdomyolysis: Secondary | ICD-10-CM | POA: Diagnosis present

## 2022-03-03 DIAGNOSIS — I248 Other forms of acute ischemic heart disease: Secondary | ICD-10-CM | POA: Diagnosis present

## 2022-03-03 DIAGNOSIS — R180 Malignant ascites: Secondary | ICD-10-CM | POA: Diagnosis not present

## 2022-03-03 DIAGNOSIS — C169 Malignant neoplasm of stomach, unspecified: Secondary | ICD-10-CM | POA: Diagnosis not present

## 2022-03-03 DIAGNOSIS — D61818 Other pancytopenia: Secondary | ICD-10-CM | POA: Diagnosis present

## 2022-03-03 DIAGNOSIS — R627 Adult failure to thrive: Secondary | ICD-10-CM | POA: Diagnosis present

## 2022-03-03 DIAGNOSIS — R748 Abnormal levels of other serum enzymes: Secondary | ICD-10-CM | POA: Diagnosis present

## 2022-03-03 DIAGNOSIS — R188 Other ascites: Secondary | ICD-10-CM | POA: Diagnosis present

## 2022-03-03 DIAGNOSIS — R3911 Hesitancy of micturition: Secondary | ICD-10-CM | POA: Diagnosis present

## 2022-03-03 DIAGNOSIS — C799 Secondary malignant neoplasm of unspecified site: Secondary | ICD-10-CM | POA: Diagnosis not present

## 2022-03-03 DIAGNOSIS — C786 Secondary malignant neoplasm of retroperitoneum and peritoneum: Secondary | ICD-10-CM | POA: Diagnosis present

## 2022-03-03 DIAGNOSIS — Z603 Acculturation difficulty: Secondary | ICD-10-CM | POA: Diagnosis present

## 2022-03-03 DIAGNOSIS — N189 Chronic kidney disease, unspecified: Secondary | ICD-10-CM | POA: Diagnosis present

## 2022-03-03 DIAGNOSIS — D539 Nutritional anemia, unspecified: Secondary | ICD-10-CM | POA: Diagnosis present

## 2022-03-03 DIAGNOSIS — I130 Hypertensive heart and chronic kidney disease with heart failure and stage 1 through stage 4 chronic kidney disease, or unspecified chronic kidney disease: Secondary | ICD-10-CM | POA: Diagnosis present

## 2022-03-03 DIAGNOSIS — N401 Enlarged prostate with lower urinary tract symptoms: Secondary | ICD-10-CM | POA: Diagnosis present

## 2022-03-03 LAB — COMPREHENSIVE METABOLIC PANEL
ALT: 168 U/L — ABNORMAL HIGH (ref 0–44)
AST: 247 U/L — ABNORMAL HIGH (ref 15–41)
Albumin: 2.6 g/dL — ABNORMAL LOW (ref 3.5–5.0)
Alkaline Phosphatase: 177 U/L — ABNORMAL HIGH (ref 38–126)
Anion gap: 6 (ref 5–15)
BUN: 27 mg/dL — ABNORMAL HIGH (ref 8–23)
CO2: 19 mmol/L — ABNORMAL LOW (ref 22–32)
Calcium: 8.4 mg/dL — ABNORMAL LOW (ref 8.9–10.3)
Chloride: 114 mmol/L — ABNORMAL HIGH (ref 98–111)
Creatinine, Ser: 1.15 mg/dL (ref 0.61–1.24)
GFR, Estimated: >60 mL/min (ref >60)
Glucose, Bld: 195 mg/dL — ABNORMAL HIGH (ref 70–99)
Potassium: 3.9 mmol/L (ref 3.5–5.1)
Sodium: 139 mmol/L (ref 135–145)
Total Bilirubin: 1.3 mg/dL — ABNORMAL HIGH (ref 0.3–1.2)
Total Protein: 4.7 g/dL — ABNORMAL LOW (ref 6.5–8.1)

## 2022-03-03 LAB — CBC
HCT: 26.8 % — ABNORMAL LOW (ref 39.0–52.0)
Hemoglobin: 8.6 g/dL — ABNORMAL LOW (ref 13.0–17.0)
MCH: 34.5 pg — ABNORMAL HIGH (ref 26.0–34.0)
MCHC: 32.1 g/dL (ref 30.0–36.0)
MCV: 107.6 fL — ABNORMAL HIGH (ref 80.0–100.0)
Platelets: 50 10*3/uL — ABNORMAL LOW (ref 150–400)
RBC: 2.49 MIL/uL — ABNORMAL LOW (ref 4.22–5.81)
RDW: 20.3 % — ABNORMAL HIGH (ref 11.5–15.5)
WBC: 5.9 10*3/uL (ref 4.0–10.5)
nRBC: 0 % (ref 0.0–0.2)

## 2022-03-03 LAB — GLUCOSE, CAPILLARY: Glucose-Capillary: 241 mg/dL — ABNORMAL HIGH (ref 70–99)

## 2022-03-03 LAB — PROTIME-INR
INR: 1.2 (ref 0.8–1.2)
Prothrombin Time: 15 seconds (ref 11.4–15.2)

## 2022-03-03 LAB — MAGNESIUM: Magnesium: 1.9 mg/dL (ref 1.7–2.4)

## 2022-03-03 LAB — SAVE SMEAR(SSMR), FOR PROVIDER SLIDE REVIEW

## 2022-03-03 LAB — PHOSPHORUS: Phosphorus: 3.2 mg/dL (ref 2.5–4.6)

## 2022-03-03 LAB — CK: Total CK: 2622 U/L — ABNORMAL HIGH (ref 49–397)

## 2022-03-03 MED ORDER — MIDODRINE HCL 5 MG PO TABS
5.0000 mg | ORAL_TABLET | Freq: Three times a day (TID) | ORAL | Status: DC
  Administered 2022-03-03 – 2022-03-06 (×11): 5 mg via ORAL
  Filled 2022-03-03 (×11): qty 1

## 2022-03-03 NOTE — Progress Notes (Signed)
   03/03/22 2036  Assess: MEWS Score  Temp (!) 100.5 F (38.1 C)  BP 122/66  MAP (mmHg) 83  Pulse Rate (!) 123  Resp 20  SpO2 94 %  Assess: MEWS Score  MEWS Temp 1  MEWS Systolic 0  MEWS Pulse 2  MEWS RR 0  MEWS LOC 0  MEWS Score 3  MEWS Score Color Yellow  Assess: if the MEWS score is Yellow or Red  Were vital signs taken at a resting state? Yes  Focused Assessment Change from prior assessment (see assessment flowsheet)  Does the patient meet 2 or more of the SIRS criteria? Yes  Does the patient have a confirmed or suspected source of infection? Yes  MEWS guidelines implemented *See Row Information* No, previously yellow, continue vital signs every 4 hours  Treat  MEWS Interventions Escalated (See documentation below)  Notify: Charge Nurse/RN  Name of Charge Nurse/RN Notified Nurse, children's  Date Charge Nurse/RN Notified 03/03/22  Time Charge Nurse/RN Notified 2041  Document  Patient Outcome Stabilized after interventions  Assess: SIRS CRITERIA  SIRS Temperature  0  SIRS Pulse 1  SIRS Respirations  0  SIRS WBC 0  SIRS Score Sum  1   Patient MEWS score is yellow due to elevated HR. Patient is alert and oriented x 4. No distress symptoms. Medication was given as MD ordered. Will monitor patient as yellow MEWS score protocol.

## 2022-03-03 NOTE — TOC Progression Note (Signed)
Transition of Care [facility]) - Progression Note    Patient Details  Name: Jimmy Blankenship MRN: [id number] Date of Birth: 04-25-1951  [facility] CM/SW Contact  Darleene Cleaver, [place] Phone Number: 03/03/2022, 4:54 PM  Clinical Narrative:     CSW was informed by bedside nurse that patient's family had questions regarding facilities.  CSW spoke to patient's son in law Vonna Kotyk [phone] to discuss disposition plans for patient.  CSW was informed that family are thinking about hospice facility if possible.  CSW explained to him that there are two facilities locally that have different levels of care.  CSW explained to him that [facility] has different requirements then [facility] but he is interested in both facilities.  Patient's son in law asked to check with [facility] first and see what they say, then check with [facility].  CSW asked if they would be interested in SNF placement for rehab, patient's family did not feel he would benefit from rehab based on the meeting with palliative and the physicians today.  CSW also talked about setting up home hospice if they need to, Vonna Kotyk said he does not think that would work well, because there is not enough support at home.  CSW explained they can pay privately for some in home care providers if they need to.  Patient's son in law said they will consider that after the hospice organizations review patient's information.  CSW was given permission to contact [facility], CSW called and spoke to weekend on call, she will review patient's information and then talk to this CSW tomorrow.  CSW to continue to follow patient's progress throughout discharge planning.  Expected Discharge Plan: Home/Self Care Barriers to Discharge: Continued Medical Work up  Expected Discharge Plan and Services Expected Discharge Plan: Home/Self Care In-house Referral: Clinical Social Work, Hospice / Palliative Care Discharge Planning  Services: CM Consult   Living arrangements for the past 2 months: Single Family Home Expected Discharge Date: 03/05/22               DME Arranged: N/A                     Social Determinants of Health (SDOH) Interventions    Readmission Risk Interventions    02/13/2022    3:37 PM 03/30/2020   11:56 AM  Readmission Risk Prevention Plan  Transportation Screening Complete Complete  PCP or Specialist Appt within 5-7 Days  Complete  PCP or Specialist Appt within 3-5 Days Complete   Home Care Screening  Complete  Medication Review (RN CM)  Complete  HRI or Home Care Consult Complete   Social Work Consult for Recovery Care Planning/Counseling Complete   Palliative Care Screening Not Applicable   Medication Review Oceanographer) Complete

## 2022-03-04 DIAGNOSIS — R627 Adult failure to thrive: Secondary | ICD-10-CM | POA: Diagnosis not present

## 2022-03-04 DIAGNOSIS — R531 Weakness: Secondary | ICD-10-CM | POA: Diagnosis not present

## 2022-03-04 DIAGNOSIS — R180 Malignant ascites: Secondary | ICD-10-CM | POA: Diagnosis not present

## 2022-03-04 DIAGNOSIS — C169 Malignant neoplasm of stomach, unspecified: Secondary | ICD-10-CM | POA: Diagnosis not present

## 2022-03-04 LAB — COMPREHENSIVE METABOLIC PANEL
ALT: 175 U/L — ABNORMAL HIGH (ref 0–44)
AST: 207 U/L — ABNORMAL HIGH (ref 15–41)
Albumin: 2.3 g/dL — ABNORMAL LOW (ref 3.5–5.0)
Alkaline Phosphatase: 179 U/L — ABNORMAL HIGH (ref 38–126)
Anion gap: 5 (ref 5–15)
BUN: 30 mg/dL — ABNORMAL HIGH (ref 8–23)
CO2: 21 mmol/L — ABNORMAL LOW (ref 22–32)
Calcium: 8.6 mg/dL — ABNORMAL LOW (ref 8.9–10.3)
Chloride: 115 mmol/L — ABNORMAL HIGH (ref 98–111)
Creatinine, Ser: 1.23 mg/dL (ref 0.61–1.24)
GFR, Estimated: >60 mL/min (ref >60)
Glucose, Bld: 180 mg/dL — ABNORMAL HIGH (ref 70–99)
Potassium: 4 mmol/L (ref 3.5–5.1)
Sodium: 141 mmol/L (ref 135–145)
Total Bilirubin: 1.9 mg/dL — ABNORMAL HIGH (ref 0.3–1.2)
Total Protein: 4.5 g/dL — ABNORMAL LOW (ref 6.5–8.1)

## 2022-03-04 LAB — CBC
HCT: 25.2 % — ABNORMAL LOW (ref 39.0–52.0)
Hemoglobin: 8.3 g/dL — ABNORMAL LOW (ref 13.0–17.0)
MCH: 35.2 pg — ABNORMAL HIGH (ref 26.0–34.0)
MCHC: 32.9 g/dL (ref 30.0–36.0)
MCV: 106.8 fL — ABNORMAL HIGH (ref 80.0–100.0)
Platelets: 51 10*3/uL — ABNORMAL LOW (ref 150–400)
RBC: 2.36 MIL/uL — ABNORMAL LOW (ref 4.22–5.81)
RDW: 19.9 % — ABNORMAL HIGH (ref 11.5–15.5)
WBC: 5.2 10*3/uL (ref 4.0–10.5)
nRBC: 0 % (ref 0.0–0.2)

## 2022-03-04 LAB — GLUCOSE, CAPILLARY
Glucose-Capillary: 190 mg/dL — ABNORMAL HIGH (ref 70–99)
Glucose-Capillary: 266 mg/dL — ABNORMAL HIGH (ref 70–99)
Glucose-Capillary: 245 mg/dL — ABNORMAL HIGH (ref 70–99)
Glucose-Capillary: 269 mg/dL — ABNORMAL HIGH (ref 70–99)

## 2022-03-04 LAB — MAGNESIUM: Magnesium: 2 mg/dL (ref 1.7–2.4)

## 2022-03-04 LAB — PROTIME-INR
INR: 1.2 (ref 0.8–1.2)
Prothrombin Time: 15.5 seconds — ABNORMAL HIGH (ref 11.4–15.2)

## 2022-03-04 LAB — PHOSPHORUS: Phosphorus: 4.1 mg/dL (ref 2.5–4.6)

## 2022-03-04 LAB — CK: Total CK: 2465 U/L — ABNORMAL HIGH (ref 49–397)

## 2022-03-04 MED ORDER — INSULIN ASPART 100 UNIT/ML IJ SOLN
0.0000 [IU] | Freq: Three times a day (TID) | INTRAMUSCULAR | Status: DC
  Administered 2022-03-04 – 2022-03-05 (×2): 5 [IU] via SUBCUTANEOUS
  Administered 2022-03-05: 2 [IU] via SUBCUTANEOUS
  Administered 2022-03-05: 5 [IU] via SUBCUTANEOUS
  Administered 2022-03-06: 3 [IU] via SUBCUTANEOUS
  Administered 2022-03-06 (×2): 2 [IU] via SUBCUTANEOUS
  Administered 2022-03-07: 5 [IU] via SUBCUTANEOUS
  Administered 2022-03-07: 2 [IU] via SUBCUTANEOUS
  Administered 2022-03-08: 3 [IU] via SUBCUTANEOUS

## 2022-03-05 ENCOUNTER — Other Ambulatory Visit (HOSPITAL_COMMUNITY): Payer: Medicare Other

## 2022-03-05 ENCOUNTER — Encounter (HOSPITAL_COMMUNITY): Payer: Self-pay | Admitting: Student

## 2022-03-05 DIAGNOSIS — R180 Malignant ascites: Secondary | ICD-10-CM | POA: Diagnosis not present

## 2022-03-05 DIAGNOSIS — R531 Weakness: Secondary | ICD-10-CM | POA: Diagnosis not present

## 2022-03-05 DIAGNOSIS — R627 Adult failure to thrive: Secondary | ICD-10-CM | POA: Diagnosis not present

## 2022-03-05 DIAGNOSIS — C169 Malignant neoplasm of stomach, unspecified: Secondary | ICD-10-CM | POA: Diagnosis not present

## 2022-03-05 LAB — COMPREHENSIVE METABOLIC PANEL
ALT: 166 U/L — ABNORMAL HIGH (ref 0–44)
AST: 119 U/L — ABNORMAL HIGH (ref 15–41)
Albumin: 2.4 g/dL — ABNORMAL LOW (ref 3.5–5.0)
Alkaline Phosphatase: 208 U/L — ABNORMAL HIGH (ref 38–126)
Anion gap: 6 (ref 5–15)
BUN: 26 mg/dL — ABNORMAL HIGH (ref 8–23)
CO2: 20 mmol/L — ABNORMAL LOW (ref 22–32)
Calcium: 8.6 mg/dL — ABNORMAL LOW (ref 8.9–10.3)
Chloride: 112 mmol/L — ABNORMAL HIGH (ref 98–111)
Creatinine, Ser: 1.08 mg/dL (ref 0.61–1.24)
GFR, Estimated: >60 mL/min (ref >60)
Glucose, Bld: 176 mg/dL — ABNORMAL HIGH (ref 70–99)
Potassium: 3.7 mmol/L (ref 3.5–5.1)
Sodium: 138 mmol/L (ref 135–145)
Total Bilirubin: 2.2 mg/dL — ABNORMAL HIGH (ref 0.3–1.2)
Total Protein: 4.7 g/dL — ABNORMAL LOW (ref 6.5–8.1)

## 2022-03-05 LAB — CBC WITH DIFFERENTIAL/PLATELET
Abs Immature Granulocytes: 0.02 10*3/uL (ref 0.00–0.07)
Basophils Absolute: 0 10*3/uL (ref 0.0–0.1)
Basophils Relative: 0 %
Eosinophils Absolute: 0.1 10*3/uL (ref 0.0–0.5)
Eosinophils Relative: 5 %
HCT: 25 % — ABNORMAL LOW (ref 39.0–52.0)
Hemoglobin: 8.2 g/dL — ABNORMAL LOW (ref 13.0–17.0)
Immature Granulocytes: 1 %
Lymphocytes Relative: 11 %
Lymphs Abs: 0.3 10*3/uL — ABNORMAL LOW (ref 0.7–4.0)
MCH: 34.9 pg — ABNORMAL HIGH (ref 26.0–34.0)
MCHC: 32.8 g/dL (ref 30.0–36.0)
MCV: 106.4 fL — ABNORMAL HIGH (ref 80.0–100.0)
Monocytes Absolute: 0.2 10*3/uL (ref 0.1–1.0)
Monocytes Relative: 8 %
Neutro Abs: 2 10*3/uL (ref 1.7–7.7)
Neutrophils Relative %: 75 %
Platelets: 54 10*3/uL — ABNORMAL LOW (ref 150–400)
RBC: 2.35 MIL/uL — ABNORMAL LOW (ref 4.22–5.81)
RDW: 19.6 % — ABNORMAL HIGH (ref 11.5–15.5)
WBC: 2.6 10*3/uL — ABNORMAL LOW (ref 4.0–10.5)
nRBC: 0 % (ref 0.0–0.2)

## 2022-03-05 LAB — CYTOLOGY - NON PAP

## 2022-03-05 LAB — GLUCOSE, CAPILLARY
Glucose-Capillary: 318 mg/dL — ABNORMAL HIGH (ref 70–99)
Glucose-Capillary: 250 mg/dL — ABNORMAL HIGH (ref 70–99)
Glucose-Capillary: 252 mg/dL — ABNORMAL HIGH (ref 70–99)
Glucose-Capillary: 193 mg/dL — ABNORMAL HIGH (ref 70–99)
Glucose-Capillary: 200 mg/dL — ABNORMAL HIGH (ref 70–99)
Glucose-Capillary: 206 mg/dL — ABNORMAL HIGH (ref 70–99)
Glucose-Capillary: 213 mg/dL — ABNORMAL HIGH (ref 70–99)
Glucose-Capillary: 143 mg/dL — ABNORMAL HIGH (ref 70–99)
Glucose-Capillary: 188 mg/dL — ABNORMAL HIGH (ref 70–99)

## 2022-03-05 LAB — PREPARE RBC (CROSSMATCH)

## 2022-03-05 MED ORDER — HYDROCOD POLI-CHLORPHE POLI ER 10-8 MG/5ML PO SUER
5.0000 mL | Freq: Once | ORAL | Status: AC
  Administered 2022-03-05: 5 mL via ORAL
  Filled 2022-03-05: qty 5

## 2022-03-05 MED ORDER — METOPROLOL TARTRATE 25 MG PO TABS: 12.5000 mg | ORAL_TABLET | Freq: Two times a day (BID) | ORAL | Status: DC

## 2022-03-05 MED ORDER — BENZONATATE 100 MG PO CAPS: 100.0000 mg | ORAL_CAPSULE | Freq: Once | ORAL | Status: DC

## 2022-03-05 MED ORDER — OXYCODONE HCL 5 MG PO TABS
5.0000 mg | ORAL_TABLET | Freq: Three times a day (TID) | ORAL | Status: DC | PRN
  Administered 2022-03-07 – 2022-03-08 (×3): 5 mg via ORAL
  Filled 2022-03-05 (×4): qty 1

## 2022-03-05 MED ORDER — MIDODRINE HCL 5 MG PO TABS: 5.0000 mg | ORAL_TABLET | Freq: Three times a day (TID) | ORAL | Status: DC

## 2022-03-05 MED ORDER — ONDANSETRON HCL 4 MG/2ML IJ SOLN: 4.0000 mg | Freq: Four times a day (QID) | INTRAMUSCULAR | 0 refills | Status: DC | PRN

## 2022-03-05 MED ORDER — DICLOFENAC SODIUM 1 % EX GEL
2.0000 g | Freq: Four times a day (QID) | CUTANEOUS | Status: DC
  Administered 2022-03-05 – 2022-03-08 (×12): 2 g via TOPICAL
  Filled 2022-03-05: qty 100

## 2022-03-05 MED ORDER — PANTOPRAZOLE SODIUM 40 MG PO TBEC: 40.0000 mg | DELAYED_RELEASE_TABLET | Freq: Two times a day (BID) | ORAL | Status: DC

## 2022-03-05 MED ORDER — GUAIFENESIN-DM 100-10 MG/5ML PO SYRP: 5.0000 mL | ORAL_SOLUTION | ORAL | 0 refills | Status: DC | PRN

## 2022-03-05 MED ORDER — FUROSEMIDE 10 MG/ML IJ SOLN
20.0000 mg | Freq: Once | INTRAMUSCULAR | Status: AC
  Administered 2022-03-05: 20 mg via INTRAVENOUS
  Filled 2022-03-05: qty 2

## 2022-03-05 MED ORDER — INSULIN GLARGINE-YFGN 100 UNIT/ML ~~LOC~~ SOLN
5.0000 [IU] | Freq: Every day | SUBCUTANEOUS | Status: DC
  Administered 2022-03-05 – 2022-03-08 (×4): 5 [IU] via SUBCUTANEOUS
  Filled 2022-03-05 (×4): qty 0.05

## 2022-03-05 MED ORDER — SODIUM CHLORIDE 0.9% IV SOLUTION: Freq: Once | INTRAVENOUS | Status: AC

## 2022-03-05 MED ORDER — GABAPENTIN 300 MG PO CAPS
300.0000 mg | ORAL_CAPSULE | Freq: Three times a day (TID) | ORAL | Status: DC
Start: 2022-03-05 — End: 2022-03-08

## 2022-03-05 MED ORDER — OXYCODONE HCL 5 MG PO TABS
5.0000 mg | ORAL_TABLET | Freq: Three times a day (TID) | ORAL | 0 refills | Status: DC | PRN
Start: 2022-03-05 — End: 2022-03-08

## 2022-03-05 NOTE — H&P (Signed)
Chief Complaint: Patient was seen in consultation today for recurrent ascites at the request of Arlan Organ, MD Referring Physician(s): Arlan Organ, MD  Supervising Physician: Richarda Overlie  Patient Status: [facility] - In-pt  History of Present Illness: Jimmy Blankenship is a 71 y.o. 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 Pt had recent admission 6/1-02/16/2022 for acute upper GI bleed.  Pt was most recently admitted again 03/01/22 for altered mental status and weakness.  Patient is well-known to IR service having multiple paracenteses for recurrent ascites 02/13/2022, 02/23/2022 and 03/02/2022.  Over the last few days patient has had recurrence of ascites.  Dr. Arlan Organ has referred patient to IR for tunneled peritoneal catheter drain placement. Per Hospice note, plan to transfer to [facility] once bed is available. Dr. Lowella Dandy approved procedure.  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) 08/22/2020   CKD (chronic kidney disease)    Coronary Artery Disease s/p CABG    hx of PCI in 2000s in [place] // s/p NSTEMI in 7/21 >> CABG   Diabetes mellitus 2    Goals of care, counseling/discussion 08/22/2020   Heart failure with reduced ejection fraction    Ischemic CM // Echo 7/21 - EF 35 // Intraop TEE 7/21: EF 40-45 // Echocardiogram 9/21: apical AK, EF 40-45, Gr 2 DD, trace AI, mild MR, mild LAE, normal RVSF, no pericardial effusion   High cholesterol    Hypertension    Iron deficiency anemia    Heme + stools (during admit for NSTEMI in 7/21)   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 (HCC) 08/22/2020   Palpitations    Event monitor 10/21: NSR, rare PVCs, rare 4 beats NSVT, rare brief non-sustained SVT    Past Surgical History:  Procedure Laterality Date   BIOPSY  08/15/2020   Procedure: BIOPSY;  Surgeon: Meryl Dare, MD;  Location: [facility] ENDOSCOPY;  Service:  Gastroenterology;;   BIOPSY  12/29/2021   Procedure: BIOPSY;  Surgeon: Lynann Bologna, MD;  Location: [facility] ENDOSCOPY;  Service: Gastroenterology;;   BIOPSY  02/10/2022   Procedure: BIOPSY;  Surgeon: Shellia Cleverly, DO;  Location: [facility] ENDOSCOPY;  Service: Gastroenterology;;   COLONOSCOPY     around 2013. Normal per patient. [place]   COLONOSCOPY N/A 08/15/2020   Procedure: COLONOSCOPY;  Surgeon: Meryl Dare, MD;  Location: [facility] ENDOSCOPY;  Service: Gastroenterology;  Laterality: N/A;   CORONARY ANGIOPLASTY WITH STENT PLACEMENT     CORONARY ARTERY BYPASS GRAFT N/A 03/24/2020   Procedure: CORONARY ARTERY BYPASS GRAFTING (CABG), ON PUMP, TIMES THREE, USING LEFT INTERNAL MAMMARY ARTERY AND RIGHT ENDOSCOPICALLY HARVESTED GREATER SAPHENOUS VEIN;  Surgeon: Kerin Perna, MD;  Location: [facility] OR;  Service: Open Heart Surgery;  Laterality: N/A;   ESOPHAGOGASTRODUODENOSCOPY N/A 08/15/2020   Procedure: ESOPHAGOGASTRODUODENOSCOPY (EGD);  Surgeon: Meryl Dare, MD;  Location: [facility] ENDOSCOPY;  Service: Gastroenterology;  Laterality: N/A;   ESOPHAGOGASTRODUODENOSCOPY (EGD) WITH PROPOFOL N/A 12/29/2021   Procedure: ESOPHAGOGASTRODUODENOSCOPY (EGD) WITH PROPOFOL;  Surgeon: Lynann Bologna, MD;  Location: [facility] ENDOSCOPY;  Service: Gastroenterology;  Laterality: N/A;   ESOPHAGOGASTRODUODENOSCOPY (EGD) WITH PROPOFOL N/A 02/10/2022   Procedure: ESOPHAGOGASTRODUODENOSCOPY (EGD) WITH PROPOFOL;  Surgeon: Shellia Cleverly, DO;  Location: [facility] ENDOSCOPY;  Service: Gastroenterology;  Laterality: N/A;   IR IMAGING GUIDED PORT INSERTION  09/16/2020   IR PARACENTESIS  02/23/2022  LEFT HEART CATH AND CORONARY ANGIOGRAPHY N/A 03/17/2020   Procedure: LEFT HEART CATH AND CORONARY ANGIOGRAPHY;  Surgeon: Lyn Records, MD;  Location: [facility] INVASIVE CV LAB;  Service: Cardiovascular;  Laterality: N/A;   POLYPECTOMY  08/15/2020   Procedure: POLYPECTOMY;  Surgeon: Meryl Dare, MD;  Location: [facility] ENDOSCOPY;  Service: Gastroenterology;;   TEE  WITHOUT CARDIOVERSION N/A 03/24/2020   Procedure: TRANSESOPHAGEAL ECHOCARDIOGRAM (TEE);  Surgeon: Donata Clay, Theron Arista, MD;  Location: [facility] OR;  Service: Open Heart Surgery;  Laterality: N/A;    Allergies: Patient has no known allergies.  Medications: Prior to Admission medications   Medication Sig Start Date End Date Taking? Authorizing Provider  atorvastatin (LIPITOR) 40 MG tablet Take 40 mg by mouth daily.   Yes [provider]  bisacodyl (DULCOLAX) 10 MG suppository Place 1 suppository (10 mg total) rectally as needed for moderate constipation. 02/14/22  Yes Calvert Cantor, MD  diphenhydrAMINE (BENADRYL) 25 MG tablet Take 25 mg by mouth at bedtime as needed for itching.   Yes [provider]  diphenoxylate-atropine (LOMOTIL) 2.5-0.025 MG tablet TAKE ONE TABLET BY MOUTH FOUR TIMES A DAY AS NEEDED FOR DIARRHEA OR LOOSE STOOLS Patient taking differently: Take 1 tablet by mouth 4 (four) times daily as needed for diarrhea or loose stools. 10/31/21  Yes Josph Macho, MD  docusate sodium (COLACE) 100 MG capsule Take 1 capsule (100 mg total) by mouth 2 (two) times daily. Patient taking differently: Take 100 mg by mouth daily as needed for mild constipation. 02/14/22  Yes Calvert Cantor, MD  feeding supplement (ENSURE ENLIVE / ENSURE PLUS) LIQD Take 237 mLs by mouth 2 (two) times daily between meals. Patient taking differently: Take 237 mLs by mouth daily. 08/16/20  Yes Regalado, Belkys A, MD  furosemide (LASIX) 20 MG tablet Take 1 tablet (20 mg total) by mouth daily as needed (for abdominal swelling (ascites)). Only take if you have treated constipation first. 02/14/22 02/14/23 Yes Calvert Cantor, MD  gabapentin (NEURONTIN) 300 MG capsule Take 1 capsule (300 mg total) by mouth 4 (four) times daily. 10/31/21  Yes Josph Macho, MD  insulin glargine (LANTUS) 100 UNIT/ML Solostar Pen Inject 10-15 Units into the skin daily as needed (for High CBG durnig chemo may  take BID).   Yes [provider]  lidocaine-prilocaine (EMLA) cream Apply to affected area once Patient taking differently: 1 application  daily as needed (port access). 02/01/21  Yes Ennever, Rose Phi, MD  LORazepam (ATIVAN) 0.5 MG tablet Take 1 tablet (0.5 mg total) by mouth every 6 (six) hours as needed (Nausea or vomiting). 02/01/21  Yes Josph Macho, MD  megestrol (MEGACE) 400 MG/10ML suspension Take 10 mLs (400 mg total) by mouth 2 (two) times daily. Patient taking differently: Take 400 mg by mouth 2 (two) times daily as needed (appetite). 02/21/21  Yes Ennever, Rose Phi, MD  melatonin 5 MG TABS Take 5 mg by mouth at bedtime.   Yes [provider]  metoprolol tartrate (LOPRESSOR) 25 MG tablet Take 0.5 tablets (12.5 mg total) by mouth 2 (two) times daily. 02/14/22 03/16/22 Yes Rizwan, Ladell Heads, MD  ondansetron (ZOFRAN) 8 MG tablet Take 1 tablet (8 mg total) by mouth 2 (two) times daily as needed for refractory nausea / vomiting. Start on day 3 after chemo. 02/01/21  Yes Josph Macho, MD  pantoprazole (PROTONIX) 40 MG tablet Take 1 tablet (40 mg total) by mouth 2 (two) times daily. Follow up with gastroenterology for additional instructions 12/30/21  03/02/22 Yes Zigmund Daniel., MD  polyethylene glycol (MIRALAX / GLYCOLAX) 17 g packet Take 17 g by mouth daily as needed for mild constipation. 02/14/22  Yes Calvert Cantor, MD  prochlorperazine (COMPAZINE) 10 MG tablet Take 1 tablet (10 mg total) by mouth every 6 (six) hours as needed (Nausea or vomiting). Patient taking differently: Take 10 mg by mouth every 6 (six) hours as needed for nausea or vomiting (Nausea or vomiting). 02/01/21  Yes Josph Macho, MD  tamsulosin (FLOMAX) 0.4 MG CAPS capsule Take 0.4 mg by mouth at bedtime. 08/07/21  Yes [provider]  capecitabine (XELODA) 500 MG tablet Take 3 tablets (1,500 mg total) by mouth daily after breakfast. Take only on days of radiation Monday through Friday. Patient not taking: Reported on  03/02/2022 02/22/22   Josph Macho, MD  dexamethasone (DECADRON) 4 MG tablet Take 2 tablets (8 mg total) by mouth daily. Start the day after chemotherapy for 2 days. Patient not taking: Reported on 02/21/2022 02/01/21   Josph Macho, MD     Family History  Problem Relation Age of Onset   Atrial fibrillation Mother    Congestive Heart Failure Mother    Cancer Father     Social History   Socioeconomic History   Marital status: Married    Spouse name: Not on file   Number of children: Not on file   Years of education: Not on file   Highest education level: Not on file  Occupational History   Occupation: Arts administrator  Tobacco Use   Smoking status: Former    Packs/day: 1.00    Years: 30.00    Total pack years: 30.00    Types: Cigarettes    Quit date: 2006    Years since quitting: 17.4   Smokeless tobacco: Never   Tobacco comments:    quit 2008  Vaping Use   Vaping Use: Never used  Substance and Sexual Activity   Alcohol use: Never   Drug use: Never   Sexual activity: Not on file  Other Topics Concern   Not on file  Social History Narrative   Not on file   Social Determinants of Health   Financial Resource Strain: Not on file  Food Insecurity: Not on file  Transportation Needs: Not on file  Physical Activity: Not on file  Stress: Not on file  Social Connections: Not on file    Review of Systems: A 12 point ROS discussed and pertinent positives are indicated in the HPI above.  All other systems are negative.  Review of Systems  All other systems reviewed and are negative.   Vital Signs: BP 108/64 (BP Location: Right Arm)   Pulse 96   Temp 97.8 F (36.6 C) (Oral)   Resp 18   Ht 5\' 9"  (1.753 m)   Wt 161 lb 6 oz (73.2 kg)   SpO2 97%   BMI 23.83 kg/m    Physical Exam Vitals reviewed.  Constitutional:      General: He is not in acute distress.    Appearance: He is ill-appearing.  HENT:     Mouth/Throat:     Pharynx: Oropharynx is clear.   Eyes:     Extraocular Movements: Extraocular movements intact.     Pupils: Pupils are equal, round, and reactive to light.  Cardiovascular:     Rate and Rhythm: Normal rate and regular rhythm.     Pulses: Normal pulses.     Heart sounds: Normal heart  sounds.  Pulmonary:     Effort: Pulmonary effort is normal. No respiratory distress.     Breath sounds: Normal breath sounds.  Abdominal:     General: Bowel sounds are normal. There is no distension.     Palpations: Abdomen is soft.     Tenderness: There is no abdominal tenderness. There is no guarding.  Skin:    General: Skin is warm and dry.  Neurological:     Mental Status: He is alert and oriented to person, place, and time.  Psychiatric:        Mood and Affect: Mood normal.        Behavior: Behavior normal.        Thought Content: Thought content normal.        Judgment: Judgment normal.     Imaging: US Paracentesis  Result Date: 03/02/2022 INDICATION: Patient with history of metastatic gastric adenocarcinoma with recurrent ascites. Request for therapeutic and diagnostic paracentesis. EXAM: ULTRASOUND GUIDED  PARACENTESIS MEDICATIONS: 10 mL 1% lidocaine COMPLICATIONS: None immediate. PROCEDURE: Informed written consent was obtained from the patient after a discussion of the risks, benefits and alternatives to treatment. A timeout was performed prior to the initiation of the procedure. Initial ultrasound scanning demonstrates a large amount of ascites within the right lower abdominal quadrant. The right lower abdomen was prepped and draped in the usual sterile fashion. 1% lidocaine was used for local anesthesia. Following this, a 19 gauge, 10-cm, Yueh catheter was introduced. An ultrasound image was saved for documentation purposes. The paracentesis was performed. The catheter was removed and a dressing was applied. The patient tolerated the procedure well without immediate post procedural complication. FINDINGS: A total of  approximately 3.5 L of hazy amber fluid was removed. Samples were sent to the laboratory as requested by the clinical team. IMPRESSION: Successful ultrasound-guided paracentesis yielding 3.5 liters of peritoneal fluid. Determined by Electronically Signed   By: Gilmer Mor D.O.   On: 03/02/2022 13:48   VAS [place] LOWER EXTREMITY VENOUS (DVT) (7a-7p)  Result Date: 03/01/2022  Lower Venous DVT Study Patient Name:  [facility]  Date of Exam:   03/01/2022 Medical Rec #: [id number]      Accession #:    [id number] Date of Birth: [birth year]      Patient Gender: M Patient Age:   63 years Exam Location:  [facility] Procedure:      VAS [place] LOWER EXTREMITY VENOUS (DVT) Referring Phys: JULIE HAVILAND --------------------------------------------------------------------------------  Indications: Edema.  Risk Factors: Cancer. Limitations: Poor ultrasound/tissue interface and calcific shadowing. Comparison Study: No previous exams Performing Technologist: Jody Hill RVT, RDMS  Examination Guidelines: A complete evaluation includes B-mode imaging, spectral Doppler, color Doppler, and power Doppler as needed of all accessible portions of each vessel. Bilateral testing is considered an integral part of a complete examination. Limited examinations for reoccurring indications may be performed as noted. The reflux portion of the exam is performed with the patient in reverse Trendelenburg.  +---------+---------------+---------+-----------+----------+--------------+ RIGHT    CompressibilityPhasicitySpontaneityPropertiesThrombus Aging +---------+---------------+---------+-----------+----------+--------------+ CFV      Full           Yes      Yes                                 +---------+---------------+---------+-----------+----------+--------------+ SFJ      Full                                                        +---------+---------------+---------+-----------+----------+--------------+  FV Prox  Full            Yes      Yes                                 +---------+---------------+---------+-----------+----------+--------------+ FV Mid   Full           Yes      Yes                                 +---------+---------------+---------+-----------+----------+--------------+ FV DistalFull           Yes      Yes                                 +---------+---------------+---------+-----------+----------+--------------+ PFV      Full                                                        +---------+---------------+---------+-----------+----------+--------------+ POP      Full           Yes      Yes                                 +---------+---------------+---------+-----------+----------+--------------+ PTV      Full                                                        +---------+---------------+---------+-----------+----------+--------------+ PERO     Full                                                        +---------+---------------+---------+-----------+----------+--------------+   +---------+---------------+---------+-----------+----------+-------------------+ LEFT     CompressibilityPhasicitySpontaneityPropertiesThrombus Aging      +---------+---------------+---------+-----------+----------+-------------------+ CFV      Full           Yes      Yes                                      +---------+---------------+---------+-----------+----------+-------------------+ SFJ      Full                                                             +---------+---------------+---------+-----------+----------+-------------------+ FV Prox  Full           Yes      Yes                                      +---------+---------------+---------+-----------+----------+-------------------+  FV Mid   Full           Yes      Yes                                      +---------+---------------+---------+-----------+----------+-------------------+ FV  DistalFull           Yes      Yes                                      +---------+---------------+---------+-----------+----------+-------------------+ PFV      Full                                                             +---------+---------------+---------+-----------+----------+-------------------+ POP      Full           Yes      Yes                                      +---------+---------------+---------+-----------+----------+-------------------+ PTV      Full                                                             +---------+---------------+---------+-----------+----------+-------------------+ PERO                                                  Not well visualized +---------+---------------+---------+-----------+----------+-------------------+     Summary: BILATERAL: - No evidence of deep vein thrombosis seen in the lower extremities, bilaterally. -No evidence of popliteal cyst, bilaterally   *See table(s) above for measurements and observations. Electronically signed by Heath Lark on 03/01/2022 at 8:25:28 PM.    Final    CT CHEST ABDOMEN PELVIS W CONTRAST  Result Date: 03/01/2022 CLINICAL DATA:  Metastatic gastric cancer, assess treatment response, progressive weakness * Tracking Code: BO * EXAM: CT CHEST, ABDOMEN, AND PELVIS WITH CONTRAST TECHNIQUE: Multidetector CT imaging of the chest, abdomen and pelvis was performed following the standard protocol during bolus administration of intravenous contrast. RADIATION DOSE REDUCTION: This exam was performed according to the departmental dose-optimization program which includes automated exposure control, adjustment of the mA and/or kV according to patient size and/or use of iterative reconstruction technique. CONTRAST:  OMNIPAQUE IOHEXOL 300 MG/ML SOLN additional oral enteric contrast COMPARISON:  CT abdomen pelvis, 12/22/2021, CT chest abdomen pelvis, 11/17/2021 FINDINGS: CT CHEST FINDINGS Cardiovascular:  Right chest port catheter. Aortic atherosclerosis. Normal heart size. Three-vessel coronary artery calcifications status post median sternotomy and CABG. No pericardial effusion. Mediastinum/Nodes: No enlarged mediastinal, hilar, or axillary lymph nodes. Thyroid gland, trachea, and esophagus demonstrate no significant findings. Lungs/Pleura: Unchanged, bandlike scarring and or atelectasis of the bilateral lung bases. Unchanged fissural nodule of the superior segment right  lower lobe measuring 0.3 cm, stable and benign (series 4, image 70). Mild paraseptal emphysema. No pleural effusion or pneumothorax. Musculoskeletal: No chest wall mass or suspicious osseous lesions identified. CT ABDOMEN PELVIS FINDINGS Hepatobiliary: Unchanged, very subtle hypodense metastases of the left lobe of the liver, hepatic segment II, measuring 1.1 x 1.0 cm (series 2, image 43) and in the posterior right lobe of the liver, hepatic segment VI measuring 1.1 x 0.9 cm (series 2, image 55). No gallstones, gallbladder wall thickening, or biliary dilatation. Pancreas: Unremarkable. No pancreatic ductal dilatation or surrounding inflammatory changes. Spleen: Normal in size without significant abnormality. Adrenals/Urinary Tract: Adrenal glands are unremarkable. Lobulated renal cortical scarring. Simple, benign renal cortical cysts of the inferior pole of the right kidney, for which no further follow-up or characterization is required. Bladder is unremarkable. Stomach/Bowel: Unchanged, treated mass of the lesser curvature of the stomach measuring approximately 7.1 x 2.9 cm (series 2, image 53). Appendix appears normal. No evidence of bowel wall thickening, distention, or inflammatory changes. Vascular/Lymphatic: Aortic atherosclerosis. No enlarged abdominal or pelvic lymph nodes. Reproductive: No mass or other abnormality. Other: No abdominal wall hernia or abnormality. Significantly increased, large volume ascites throughout the abdomen and  pelvis, with stranding of the omentum and peritoneum (series 2, image 54) Musculoskeletal: No acute osseous findings. IMPRESSION: 1. Unchanged, treated mass of the lesser curvature of the stomach, in keeping with known gastric malignancy. 2. Unchanged, very subtle liver metastases.  No new liver lesions. 3. Significantly increased, large volume ascites throughout the abdomen and pelvis, with stranding of the omentum and peritoneum, suspicious for peritoneal metastatic disease but without overt nodularity. 4. No evidence of metastatic disease in the chest. Aortic Atherosclerosis (ICD10-I70.0) and Emphysema (ICD10-J43.9). Electronically Signed   By: Jearld Lesch M.D.   On: 03/01/2022 14:41   IR Paracentesis  Result Date: 02/23/2022 INDICATION: Metastatic gastric adenocarcinoma with ascites. Request for and therapeutic paracentesis. EXAM: ULTRASOUND GUIDED PARACENTESIS MEDICATIONS: 1% lidocaine 8 mL COMPLICATIONS: None immediate. PROCEDURE: Informed written consent was obtained from the patient after a discussion of the risks, benefits and alternatives to treatment. A timeout was performed prior to the initiation of the procedure. Initial ultrasound scanning demonstrates a large amount of ascites within the right lower abdominal quadrant. The right lower abdomen was prepped and draped in the usual sterile fashion. 1% lidocaine was used for local anesthesia. Following this, a 19 gauge, 7-cm, Yueh catheter was introduced. An ultrasound image was saved for documentation purposes. The paracentesis was performed. The catheter was removed and a dressing was applied. The patient tolerated the procedure well without immediate post procedural complication. Patient received post-procedure intravenous albumin; see nursing notes for details. FINDINGS: A total of approximately 4.4 L of clear yellow fluid was removed. Samples were sent to the laboratory as requested by the clinical team. IMPRESSION: Successful ultrasound-guided  paracentesis yielding 4.4 liters of peritoneal fluid. Procedure performed by: Corrin Parker, PA-C Electronically Signed   By: Malachy Moan M.D.   On: 02/23/2022 15:39   US Paracentesis  Result Date: 02/13/2022 INDICATION: History of metastatic gastric adenocarcinoma with new onset ascites. Patient was referred to IR for diagnostic and therapeutic paracentesis. EXAM: ULTRASOUND GUIDED DIAGNOSTIC AND THERAPEUTIC RIGHT LOWER QUADRANT PARACENTESIS MEDICATIONS: 10 mL 1 % lidocaine COMPLICATIONS: None immediate. PROCEDURE: Informed written consent was obtained from the patient after a discussion of the risks, benefits and alternatives to treatment. A timeout was performed prior to the initiation of the procedure. Initial ultrasound scanning demonstrates a large amount of  ascites within the right lower abdominal quadrant. The right lower abdomen was prepped and draped in the usual sterile fashion. 1% lidocaine was used for local anesthesia. Following this, a 19 gauge, 7-cm, Yueh catheter was introduced. An ultrasound image was saved for documentation purposes. The paracentesis was performed. The catheter was removed and a dressing was applied. The patient tolerated the procedure well without immediate post procedural complication. FINDINGS: A total of approximately 4.4 L of clear, yellow fluid was removed. Samples were sent to the laboratory as requested by the clinical team. IMPRESSION: Successful ultrasound-guided paracentesis yielding 4.4 liters of peritoneal fluid. Read by: Alex Gardener, AGNP-BC Electronically Signed   By: Roanna Banning M.D.   On: 02/13/2022 17:29   [place] ASCITES (ABDOMEN LIMITED)  Result Date: 02/12/2022 CLINICAL DATA:  Ascites.  Gastric cancer. EXAM: LIMITED ABDOMEN ULTRASOUND FOR ASCITES TECHNIQUE: Limited ultrasound survey for ascites was performed in all four abdominal quadrants. COMPARISON:  CT abdomen 12/22/2021 FINDINGS: There is a moderate to large amount of ascites in the abdomen. This  represents a substantial increase compared to 12/22/2021. IMPRESSION: 1. Moderate to large amount of ascites, increased from April 2023. Electronically Signed   By: Gaylyn Rong M.D.   On: 02/12/2022 20:05   DG Abd Portable 1V  Result Date: 02/12/2022 CLINICAL DATA:  [id number]; follow-up ileus EXAM: PORTABLE ABDOMEN - 1 VIEW COMPARISON:  Radiograph dated February 11, 2022 FINDINGS: Air-filled nondilated loops of bowel. Incomplete assessment of the pelvis and lung bases. Degenerative changes of the lower lumbar spine. IMPRESSION: Nonobstructive bowel gas pattern. Electronically Signed   By: Meda Klinefelter M.D.   On: 02/12/2022 08:53   DG Abd 1 View  Result Date: 02/11/2022 CLINICAL DATA:  Abdominal distension EXAM: ABDOMEN - 1 VIEW COMPARISON:  February 20, 2021 FINDINGS: Evaluation is limited due to supine imaging. Within this limitation, no free air, portal venous gas, or pneumatosis is identified. The stomach is air-filled and mildly prominent. Otherwise, the bowel gas pattern is nonobstructive. Large and small bowel loops are normal in caliber. No other acute abnormalities. IMPRESSION: Large and small bowel loops are normal in caliber. The stomach contains air and is mildly prominent. No other abnormalities. Electronically Signed   By: Gerome Sam III M.D.   On: 02/11/2022 12:42   ECHOCARDIOGRAM COMPLETE  Result Date: 02/10/2022    ECHOCARDIOGRAM REPORT   Patient Name:   Tahjai HO Eklund Date of Exam: 02/09/2022 Medical Rec #:  [id number]     Height:       69.0 in Accession #:    [id number]    Weight:       147.0 lb Date of Birth:  30-May-1951     BSA:          1.813 m Patient Age:    71 years      BP:           99/61 mmHg Patient Gender: M             HR:           105 bpm. Exam Location:  Inpatient Procedure: 2D Echo Indications:    Chemo. History of intracardiac thrombus.  History:        Patient has prior history of Echocardiogram examinations, most                 recent 08/28/2021. CAD, Cancer; Risk  Factors:Dyslipidemia,                 Hypertension and  Diabetes.  Sonographer:    Rodrigo Ran Referring Phys: [id number] PETER R ENNEVER IMPRESSIONS  1. No left ventricular thrombus is seen with Definity contrast. Left ventricular ejection fraction, by estimation, is 35 to 40%. The left ventricle has moderately decreased function. The left ventricle demonstrates regional wall motion abnormalities (see scoring diagram/findings for description). Left ventricular diastolic parameters are consistent with Grade I diastolic dysfunction (impaired relaxation). There is dyskinesis of the entire left ventricular apex.  2. Right ventricular systolic function is normal. The right ventricular size is normal. There is normal pulmonary artery systolic pressure.  3. Left atrial size was severely dilated.  4. The mitral valve is normal in structure. Moderate mitral valve regurgitation.  5. The aortic valve is tricuspid. There is mild calcification of the aortic valve. There is mild thickening of the aortic valve. Aortic valve regurgitation is not visualized. Aortic valve sclerosis is present, with no evidence of aortic valve stenosis.  6. The inferior vena cava is dilated in size with >50% respiratory variability, suggesting right atrial pressure of 8 mmHg. Comparison(s): No significant change from prior study. Prior images reviewed side by side. FINDINGS  Left Ventricle: No left ventricular thrombus is seen with Definity contrast. Left ventricular ejection fraction, by estimation, is 35 to 40%. The left ventricle has moderately decreased function. The left ventricle demonstrates regional wall motion abnormalities. Definity contrast agent was given IV to delineate the left ventricular endocardial borders. The left ventricular internal cavity size was normal in size. There is no left ventricular hypertrophy. Left ventricular diastolic parameters are consistent with Grade I diastolic dysfunction (impaired relaxation). Normal left  ventricular filling pressure.  LV Wall Scoring: The entire apex is dyskinetic. Right Ventricle: The right ventricular size is normal. No increase in right ventricular wall thickness. Right ventricular systolic function is normal. There is normal pulmonary artery systolic pressure. The tricuspid regurgitant velocity is 2.19 m/s, and  with an assumed right atrial pressure of 3 mmHg, the estimated right ventricular systolic pressure is 22.2 mmHg. Left Atrium: Left atrial size was severely dilated. Right Atrium: Right atrial size was normal in size. Pericardium: There is no evidence of pericardial effusion. Mitral Valve: The mitral valve is normal in structure. Moderate mitral valve regurgitation, with eccentric posteriorly directed jet. Tricuspid Valve: The tricuspid valve is normal in structure. Tricuspid valve regurgitation is mild. Aortic Valve: The aortic valve is tricuspid. There is mild calcification of the aortic valve. There is mild thickening of the aortic valve. Aortic valve regurgitation is not visualized. Aortic valve sclerosis is present, with no evidence of aortic valve stenosis. Aortic valve mean gradient measures 4.0 mmHg. Aortic valve peak gradient measures 6.1 mmHg. Aortic valve area, by VTI measures 3.86 cm. Pulmonic Valve: The pulmonic valve was normal in structure. Pulmonic valve regurgitation is not visualized. Aorta: The aortic root is normal in size and structure. Venous: The inferior vena cava is dilated in size with greater than 50% respiratory variability, suggesting right atrial pressure of 8 mmHg. IAS/Shunts: No atrial level shunt detected by color flow Doppler. Additional Comments: A venous catheter is visualized in the right atrium. There is pleural effusion in the left lateral region.  LEFT VENTRICLE PLAX 2D LVIDd:         4.00 cm      Diastology LVIDs:         3.40 cm      LV e' lateral:   13.40 cm/s LV PW:         1.10 cm  LV E/e' lateral: 3.9 LV IVS:        1.10 cm LVOT diam:      2.20 cm LV SV:         81 LV SV Index:   45 LVOT Area:     3.80 cm  LV Volumes (MOD) LV vol d, MOD A2C: 114.0 ml LV vol d, MOD A4C: 123.0 ml LV vol s, MOD A2C: 63.9 ml LV vol s, MOD A4C: 85.8 ml LV SV MOD A2C:     50.1 ml LV SV MOD A4C:     123.0 ml LV SV MOD BP:      46.7 ml RIGHT VENTRICLE            IVC RV Basal diam:  2.40 cm    IVC diam: 2.00 cm RV Mid diam:    1.90 cm RV S prime:     8.27 cm/s TAPSE (M-mode): 0.6 cm LEFT ATRIUM             Index        RIGHT ATRIUM           Index LA diam:        4.90 cm 2.70 cm/m   RA Area:     16.40 cm LA Vol (A2C):   48.8 ml 26.92 ml/m  RA Volume:   35.20 ml  19.42 ml/m LA Vol (A4C):   52.3 ml 28.85 ml/m LA Biplane Vol: 51.7 ml 28.52 ml/m  AORTIC VALVE                     PULMONIC VALVE AV Area (Vmax):    3.83 cm      PV Vmax:       0.99 m/s AV Area (Vmean):   3.25 cm      PV Peak grad:  3.9 mmHg AV Area (VTI):     3.86 cm AV Vmax:           123.00 cm/s AV Vmean:          104.000 cm/s AV VTI:            0.211 m AV Peak Grad:      6.1 mmHg AV Mean Grad:      4.0 mmHg LVOT Vmax:         124.00 cm/s LVOT Vmean:        88.900 cm/s LVOT VTI:          0.214 m LVOT/AV VTI ratio: 1.01  AORTA Ao Root diam: 3.50 cm MITRAL VALVE                  TRICUSPID VALVE MV Area (PHT): 9.85 cm       TR Peak grad:   19.2 mmHg MV Decel Time: 77 msec        TR Vmax:        219.00 cm/s MR Peak grad:    106.5 mmHg MR Mean grad:    65.0 mmHg    SHUNTS MR Vmax:         516.00 cm/s  Systemic VTI:  0.21 m MR Vmean:        387.0 cm/s   Systemic Diam: 2.20 cm MR PISA:         0.57 cm MR PISA Eff ROA: 4 mm MR PISA Radius:  0.30 cm MV E velocity: 52.00 cm/s MV A velocity: 114.00 cm/s MV E/A ratio:  0.46 Mihai Croitoru MD Electronically signed by Rachelle Hora  Croitoru MD Signature Date/Time: 02/10/2022/10:06:18 AM    Final     Labs:  CBC: Recent Labs    03/01/22 1423 03/02/22 0324 03/03/22 0321 03/04/22 0405  WBC 5.5 9.0 5.9 5.2  HGB 10.5* 9.0* 8.6* 8.3*  HCT 31.6* 27.7* 26.8* 25.2*  PLT  56* 51* 50* 51*    COAGS: Recent Labs    12/29/21 0031 03/02/22 0324 03/03/22 0321 03/04/22 0405  INR 1.1 1.2 1.2 1.2  APTT 35 58*  --   --     BMP: Recent Labs    03/01/22 1423 03/02/22 0324 03/03/22 0321 03/04/22 0405  NA 139 140 139 141  K 3.9 3.4* 3.9 4.0  CL 111 114* 114* 115*  CO2 21* 19* 19* 21*  GLUCOSE 110* 134* 195* 180*  BUN 29* 29* 27* 30*  CALCIUM 8.9 8.6* 8.4* 8.6*  CREATININE 1.20 1.19 1.15 1.23  GFRNONAA >60 >60 >60 >60    LIVER FUNCTION TESTS: Recent Labs    03/01/22 1423 03/02/22 0324 03/03/22 0321 03/04/22 0405  BILITOT 1.8* 1.8* 1.3* 1.9*  AST 408* 315* 247* 207*  ALT 194* 169* 168* 175*  ALKPHOS 192* 182* 177* 179*  PROT 5.3* 4.6* 4.7* 4.5*  ALBUMIN 2.9* 2.6* 2.6* 2.3*    TUMOR MARKERS: No results for input(s): "AFPTM", "CEA", "CA199", "CHROMGRNA" in the last 8760 hours.  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 Pt had recent admission 6/1-02/16/2022 for acute upper GI bleed.  Pt was most recently admitted again 03/01/22 for altered mental status and weakness.  Patient is well-known to IR service having multiple paracenteses for recurrent ascites 02/13/2022, 02/23/2022 and 03/02/2022.  Over the last few days patient has had recurrence of ascites.  Dr. Arlan Organ has referred patient to IR for tunneled peritoneal catheter drain placement. Per Hospice note, plan to transfer to [facility] once bed is available. Dr. Lowella Dandy approved procedure.  Consult was performed through [facility].  Pt resting in bed.  He is A&O, calm and pleasant.  He is in no distress. Pt states he ate breakfast. NT reports around 0745. Pt has been NPO since.   Risks and benefits discussed with the patient including bleeding, infection, pneumothorax, damage to adjacent structures, and sepsis.  All of the patient's questions were answered, patient is agreeable to proceed. Consent signed  and in chart.   Thank you for this interesting consult.  I greatly enjoyed meeting Duncan Bloyer and look forward to participating in their care.  A copy of this report was sent to the requesting provider on this date.  Electronically Signed: Shon Hough, NP 03/05/2022, 11:29 AM   I spent a total of 20 minutes in face to face in clinical consultation, greater than 50% of which was counseling/coordinating care for recurrent ascites.

## 2022-03-05 NOTE — TOC Transition Note (Addendum)
Transition of Care [facility]) - CM/SW Discharge Note   Patient Details  Name: Jimmy Blankenship MRN: [id number] Date of Birth: 04-14-1951  Transition [facility]) CM/SW Contact:  Otelia Santee, LCSW Phone Number: 03/05/2022, 2:16 PM   Clinical Narrative:    Spoke with pt's son-in-law Vonna Kotyk who was aware that pt does not currently meet residential hospice criteria. Vonna Kotyk is agreeable to Hospice care at home and has been in touch with Cheri at [facility] [place] to begin services. [facility] [place] has ordered appropriate DME to be delivered to pt's house. PTAR will need to be arranged for transportation for this pt.   1500: CSW faxed PleurX order form to Erie Noe at the [facility] for Dr. Myna Hidalgo to sign. Erie Noe is to fax this form to [facility] once completed.  Pt will also need a box of 10 canisters delivered to room that he will discharge home with. RN has been made aware.  Final next level of care: Home w Hospice Care Barriers to Discharge: Barriers Resolved   Patient Goals and CMS Choice Patient states their goals for this hospitalization and ongoing recovery are:: Return home   Choice offered to / list presented to : Adult Children, Spouse, Patient  Discharge Placement                       Discharge Plan and Services In-house Referral: Clinical Social Work, Hospice / Palliative Care Discharge Planning Services: CM Consult            DME Arranged: N/A DME Agency: [facility]                  Social Determinants of Health (SDOH) Interventions     Readmission Risk Interventions    02/13/2022    3:37 PM 03/30/2020   11:56 AM  Readmission Risk Prevention Plan  Transportation Screening Complete Complete  PCP or Specialist Appt within 5-7 Days  Complete  PCP or Specialist Appt within 3-5 Days Complete   Home Care Screening  Complete  Medication Review (RN CM)  Complete  HRI or Home Care Consult Complete    Social Work Consult for Recovery Care Planning/Counseling Complete   Palliative Care Screening Not Applicable   Medication Review [employer]) Complete

## 2022-03-05 NOTE — Progress Notes (Signed)
Notified primary RN that patient's tunneled peritoneal catheter drain placement will be postponed until 03/06/22.    Pt may eat his previously ordered diet. He will be NPO at [facility] tonight.     Alex Gardener, AGNP-BC 03/05/2022, 4:39 PM

## 2022-03-05 NOTE — Progress Notes (Signed)
[facility])  Reviewed notes and per Dr. Myna Hidalgo, pt is not quiet ready for residential hospice and still needs some medical optimization.   [facility] will continue to follow peripherally until a d/c determination is made and confirmation of which hospice agency.  Please reach out if [facility] is needed further.  Thank you, Wallis Bamberg DNP, RN [facility] Liaison  (In [place] under Hospice, [facility])

## 2022-03-06 ENCOUNTER — Inpatient Hospital Stay (HOSPITAL_COMMUNITY): Payer: Medicare Other

## 2022-03-06 DIAGNOSIS — R531 Weakness: Secondary | ICD-10-CM | POA: Diagnosis not present

## 2022-03-06 DIAGNOSIS — C169 Malignant neoplasm of stomach, unspecified: Secondary | ICD-10-CM | POA: Diagnosis not present

## 2022-03-06 DIAGNOSIS — R180 Malignant ascites: Secondary | ICD-10-CM | POA: Diagnosis not present

## 2022-03-06 DIAGNOSIS — D61818 Other pancytopenia: Secondary | ICD-10-CM

## 2022-03-06 DIAGNOSIS — R627 Adult failure to thrive: Secondary | ICD-10-CM | POA: Diagnosis not present

## 2022-03-06 LAB — CK: Total CK: 338 U/L (ref 49–397)

## 2022-03-06 LAB — CBC WITH DIFFERENTIAL/PLATELET
Abs Immature Granulocytes: 0.01 10*3/uL (ref 0.00–0.07)
Basophils Absolute: 0 10*3/uL (ref 0.0–0.1)
Basophils Relative: 1 %
Eosinophils Absolute: 0.2 10*3/uL (ref 0.0–0.5)
Eosinophils Relative: 8 %
HCT: 27.9 % — ABNORMAL LOW (ref 39.0–52.0)
Hemoglobin: 9.3 g/dL — ABNORMAL LOW (ref 13.0–17.0)
Immature Granulocytes: 1 %
Lymphocytes Relative: 16 %
Lymphs Abs: 0.3 10*3/uL — ABNORMAL LOW (ref 0.7–4.0)
MCH: 34.1 pg — ABNORMAL HIGH (ref 26.0–34.0)
MCHC: 33.3 g/dL (ref 30.0–36.0)
MCV: 102.2 fL — ABNORMAL HIGH (ref 80.0–100.0)
Monocytes Absolute: 0.2 10*3/uL (ref 0.1–1.0)
Monocytes Relative: 9 %
Neutro Abs: 1.3 10*3/uL — ABNORMAL LOW (ref 1.7–7.7)
Neutrophils Relative %: 65 %
Platelets: 50 10*3/uL — ABNORMAL LOW (ref 150–400)
RBC: 2.73 MIL/uL — ABNORMAL LOW (ref 4.22–5.81)
RDW: 21.5 % — ABNORMAL HIGH (ref 11.5–15.5)
WBC: 2 10*3/uL — ABNORMAL LOW (ref 4.0–10.5)
nRBC: 0 % (ref 0.0–0.2)

## 2022-03-06 LAB — BPAM RBC
Blood Product Expiration Date: 202307192359
ISSUE DATE / TIME: 202306261752
Unit Type and Rh: 6200

## 2022-03-06 LAB — COMPREHENSIVE METABOLIC PANEL
ALT: 151 U/L — ABNORMAL HIGH (ref 0–44)
AST: 92 U/L — ABNORMAL HIGH (ref 15–41)
Albumin: 2.2 g/dL — ABNORMAL LOW (ref 3.5–5.0)
Alkaline Phosphatase: 200 U/L — ABNORMAL HIGH (ref 38–126)
Anion gap: 6 (ref 5–15)
BUN: 32 mg/dL — ABNORMAL HIGH (ref 8–23)
CO2: 21 mmol/L — ABNORMAL LOW (ref 22–32)
Calcium: 8.6 mg/dL — ABNORMAL LOW (ref 8.9–10.3)
Chloride: 113 mmol/L — ABNORMAL HIGH (ref 98–111)
Creatinine, Ser: 1.15 mg/dL (ref 0.61–1.24)
GFR, Estimated: >60 mL/min (ref >60)
Glucose, Bld: 164 mg/dL — ABNORMAL HIGH (ref 70–99)
Potassium: 3.9 mmol/L (ref 3.5–5.1)
Sodium: 140 mmol/L (ref 135–145)
Total Bilirubin: 1.8 mg/dL — ABNORMAL HIGH (ref 0.3–1.2)
Total Protein: 4.5 g/dL — ABNORMAL LOW (ref 6.5–8.1)

## 2022-03-06 LAB — TYPE AND SCREEN
ABO/RH(D): A POS
Antibody Screen: NEGATIVE
Unit division: 0

## 2022-03-06 LAB — GLUCOSE, CAPILLARY
Glucose-Capillary: 143 mg/dL — ABNORMAL HIGH (ref 70–99)
Glucose-Capillary: 136 mg/dL — ABNORMAL HIGH (ref 70–99)
Glucose-Capillary: 155 mg/dL — ABNORMAL HIGH (ref 70–99)
Glucose-Capillary: 176 mg/dL — ABNORMAL HIGH (ref 70–99)

## 2022-03-06 MED ORDER — MIDODRINE HCL 5 MG PO TABS
10.0000 mg | ORAL_TABLET | Freq: Three times a day (TID) | ORAL | Status: DC
  Administered 2022-03-06 – 2022-03-08 (×5): 10 mg via ORAL
  Filled 2022-03-06 (×5): qty 2

## 2022-03-06 MED ORDER — LIDOCAINE HCL 1 % IJ SOLN
INTRAMUSCULAR | Status: AC
  Filled 2022-03-06: qty 20

## 2022-03-06 MED ORDER — MIDAZOLAM HCL 2 MG/2ML IJ SOLN
INTRAMUSCULAR | Status: AC
  Filled 2022-03-06: qty 2

## 2022-03-06 MED ORDER — FENTANYL CITRATE (PF) 100 MCG/2ML IJ SOLN
INTRAMUSCULAR | Status: AC
  Filled 2022-03-06: qty 2

## 2022-03-06 MED ORDER — LIP MEDEX EX OINT
TOPICAL_OINTMENT | CUTANEOUS | Status: DC | PRN
Start: 2022-03-06 — End: 2022-03-08
  Filled 2022-03-06: qty 7

## 2022-03-06 NOTE — Progress Notes (Signed)
Brought pt to IR for Pleurex catheter placement for ascites.  Dr. Archer Asa performed an [place] on patient and there was not enough fluid to safely place catheter.  Dr. Archer Asa wants pt to return as an outpt for procedure.

## 2022-03-07 ENCOUNTER — Inpatient Hospital Stay (HOSPITAL_COMMUNITY): Payer: Medicare Other

## 2022-03-07 ENCOUNTER — Encounter: Payer: Self-pay | Admitting: Hematology & Oncology

## 2022-03-07 ENCOUNTER — Other Ambulatory Visit: Payer: Self-pay | Admitting: Pharmacist

## 2022-03-07 DIAGNOSIS — C169 Malignant neoplasm of stomach, unspecified: Secondary | ICD-10-CM | POA: Diagnosis not present

## 2022-03-07 DIAGNOSIS — R180 Malignant ascites: Secondary | ICD-10-CM | POA: Diagnosis not present

## 2022-03-07 DIAGNOSIS — R531 Weakness: Secondary | ICD-10-CM | POA: Diagnosis not present

## 2022-03-07 DIAGNOSIS — R627 Adult failure to thrive: Secondary | ICD-10-CM | POA: Diagnosis not present

## 2022-03-07 LAB — CULTURE, BODY FLUID W GRAM STAIN -BOTTLE: Culture: NO GROWTH

## 2022-03-07 LAB — CBC WITH DIFFERENTIAL/PLATELET
Abs Immature Granulocytes: 0.02 10*3/uL (ref 0.00–0.07)
Basophils Absolute: 0 10*3/uL (ref 0.0–0.1)
Basophils Relative: 1 %
Eosinophils Absolute: 0.2 10*3/uL (ref 0.0–0.5)
Eosinophils Relative: 11 %
HCT: 29.3 % — ABNORMAL LOW (ref 39.0–52.0)
Hemoglobin: 9.8 g/dL — ABNORMAL LOW (ref 13.0–17.0)
Immature Granulocytes: 1 %
Lymphocytes Relative: 20 %
Lymphs Abs: 0.3 10*3/uL — ABNORMAL LOW (ref 0.7–4.0)
MCH: 34.4 pg — ABNORMAL HIGH (ref 26.0–34.0)
MCHC: 33.4 g/dL (ref 30.0–36.0)
MCV: 102.8 fL — ABNORMAL HIGH (ref 80.0–100.0)
Monocytes Absolute: 0.2 10*3/uL (ref 0.1–1.0)
Monocytes Relative: 11 %
Neutro Abs: 0.8 10*3/uL — ABNORMAL LOW (ref 1.7–7.7)
Neutrophils Relative %: 56 %
Platelets: 55 10*3/uL — ABNORMAL LOW (ref 150–400)
RBC: 2.85 MIL/uL — ABNORMAL LOW (ref 4.22–5.81)
RDW: 21.2 % — ABNORMAL HIGH (ref 11.5–15.5)
WBC: 1.4 10*3/uL — CL (ref 4.0–10.5)
nRBC: 0 % (ref 0.0–0.2)

## 2022-03-07 LAB — COMPREHENSIVE METABOLIC PANEL
ALT: 137 U/L — ABNORMAL HIGH (ref 0–44)
AST: 71 U/L — ABNORMAL HIGH (ref 15–41)
Albumin: 2.2 g/dL — ABNORMAL LOW (ref 3.5–5.0)
Alkaline Phosphatase: 229 U/L — ABNORMAL HIGH (ref 38–126)
Anion gap: 7 (ref 5–15)
BUN: 37 mg/dL — ABNORMAL HIGH (ref 8–23)
CO2: 20 mmol/L — ABNORMAL LOW (ref 22–32)
Calcium: 8.7 mg/dL — ABNORMAL LOW (ref 8.9–10.3)
Chloride: 115 mmol/L — ABNORMAL HIGH (ref 98–111)
Creatinine, Ser: 1.16 mg/dL (ref 0.61–1.24)
GFR, Estimated: >60 mL/min (ref >60)
Glucose, Bld: 142 mg/dL — ABNORMAL HIGH (ref 70–99)
Potassium: 3.8 mmol/L (ref 3.5–5.1)
Sodium: 142 mmol/L (ref 135–145)
Total Bilirubin: 1.8 mg/dL — ABNORMAL HIGH (ref 0.3–1.2)
Total Protein: 4.5 g/dL — ABNORMAL LOW (ref 6.5–8.1)

## 2022-03-07 LAB — GLUCOSE, CAPILLARY
Glucose-Capillary: 137 mg/dL — ABNORMAL HIGH (ref 70–99)
Glucose-Capillary: 118 mg/dL — ABNORMAL HIGH (ref 70–99)
Glucose-Capillary: 226 mg/dL — ABNORMAL HIGH (ref 70–99)
Glucose-Capillary: 158 mg/dL — ABNORMAL HIGH (ref 70–99)

## 2022-03-07 MED ORDER — SIMETHICONE 80 MG PO CHEW
80.0000 mg | CHEWABLE_TABLET | Freq: Four times a day (QID) | ORAL | Status: DC
  Administered 2022-03-07 – 2022-03-08 (×4): 80 mg via ORAL
  Filled 2022-03-07 (×4): qty 1

## 2022-03-07 MED ORDER — FILGRASTIM 480 MCG/1.6ML IJ SOLN
480.0000 ug | Freq: Once | INTRAMUSCULAR | Status: AC
  Administered 2022-03-07: 480 ug via SUBCUTANEOUS
  Filled 2022-03-07: qty 1.6

## 2022-03-07 NOTE — Progress Notes (Signed)
I appreciate Interventional Radiology trying to place a peritoneal catheter in.  Apparently, there was not enough fluid to do this.  On his exam today, it does look like there is fluid in the abdomen.  He is having a little bit of abdominal pain.  I think it would be worthwhile trying to get some fluid out.  I would hold off on a peritoneal catheter.  His lab work shows white cell count going down.  I does think this is part of his bone marrow not working well.  I think that we should give him 1 dose of Neupogen.  His hemoglobin is 9.8.  Platelet count 55,000.  His albumin is 2.2.  Calcium 8.2 with a calculated corrected calcium of 10.  His BUN is 37 creatinine 1.16.  He might be eating a little bit better.  I do not think he is eating all that much.  He has had a little bit of a cough but it is nonproductive.  Our goal is to try to get him home with Hospice.  From what his wife says, a bed will be delivered today.  Hopefully, he will be able to go home tomorrow.  His vital signs are all stable.  Blood pressure 96/60.  His pulse is 93.  Temperature 97.7.  His lungs sound clear bilaterally.  Cardiac exam regular rate and rhythm.  Abdomen is slightly distended.  There is a fluid wave.  He has no guarding or rebound tenderness.  Extremity shows no clubbing, cyanosis or edema.  Jimmy Blankenship has end-stage gastric cancer.  He is going to go home with Hospice.  I still believe that his prognosis is easily less than 1 month.  We will try Neupogen on him.  I know this is only a temporizing measure.  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  I know that he is getting outstanding care from the wonderful staff up on [address].  Lattie Haw, MD  Jeneen Rinks 1:5

## 2022-03-07 NOTE — Progress Notes (Signed)
PROGRESS NOTE  Kasson Lamere IRS:[id number] DOB: 21-Aug-1951   PCP: [name], MD  Patient is from: Home.  Lives with wife.  Uses walker at baseline.  DOA: 03/01/2022 LOS: 4  Chief complaints Chief Complaint  Patient presents with   Weakness     Brief Narrative / Interim history: 71 y.o. 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  In ED, stable vitals. Cr 1.2 (about baseline).  AST 408.  ALT 194.  Total bili 1.8.  ALP 194.  Troponin 50.  Hgb 10.5 (about baseline).  EKG features sinus rhythm.  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  LE venous Doppler negative for DVT.   Patient had paracentesis with removal of 3.5 L.  Fluid study negative for SBP.  Oncology evaluated patient and recommended residential hospice.  Palliative medicine met with the patient and family.  Family interested in residential hospice but patient did not qualify for residential hospice.  Plan is to discharge home with home hospice on 6/29.   Subjective: Seen and examined earlier this morning with the help of video interpreter.  Patient's wife at bedside.  Patient has no complaints but wanted to know if he can get peritoneal catheter placed before going home.  IR evaluated patient.  Limited abdominal US did not show sufficient fluid to perform procedure.   Objective: Vitals:   03/06/22 2102 03/07/22 0517 03/07/22 1106 03/07/22 1213  BP: (!) 99/57 96/60 112/72 96/64  Pulse: 89 93 89 78  Resp: '17 20  18  ' Temp: 97.9 F (36.6 C) 97.7 F  (36.5 C)  97.8 F (36.6 C)  TempSrc:  Oral  Oral  SpO2: 95% 96%  98%  Weight:  69.7 kg    Height:        Examination:  GENERAL: Appears frail.  No apparent distress. HEENT: MMM.  Vision and hearing grossly intact.  NECK: Supple.  No apparent JVD.  RESP:  No IWOB.  Fair aeration bilaterally. CVS:  RRR. Heart sounds normal.  ABD/GI/GU: BS+. Abd slightly distended with some resonance to percussion.  MSK/EXT:  Moves extremities. No apparent deformity. No edema.  SKIN: no apparent skin lesion or wound NEURO: Awake and alert. Oriented appropriately.  No apparent focal neuro deficit. PSYCH: Calm. Normal affect.   Procedures:  US guided paracentesis with removal of 3.5 L hazy amber fluid.  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  S/p paracentesis with removal of 3.5 L on 6/23.  Fluid study negative for SBP. -Appreciate help by oncology and palliative medicine. -Peritoneal catheter placement requested by oncology but no sufficient fluid for procedure per IR. -Plan for discharge home with home hospice on 6/29. -Simethicone every 6  hours as needed   Elevated liver enzymes/hyperbilirubinemia: Likely due to rhabdo.  CK 4000>> 338.  No trauma or fall.  He was on a statin.  Improved. -Continue holding statin -Not sure if lab monitoring is necessary  Nontraumatic rhabdomyolysis: Due to statin?  Resolved. -Continue holding statin and discontinue on discharge   Pancytopenia-worsening leukopenia.  Thrombocytopenia stable. IDA due to chronic BLA/UGI bleed/gastritis:   Recent EGD on 6/8 showed gastritis.  Stable after 1 unit. Recent Labs    02/13/22 0500 02/14/22 0500 02/20/22 0945 03/01/22 1423 03/02/22 0324 03/03/22 0321 03/04/22 0405 03/05/22 1517 03/06/22 0403 03/07/22 0331  HGB 9.7* 9.8* 11.6* 10.5* 9.0* 8.6* 8.3* 8.2* 9.3* 9.8*  -Continue Protonix -Received Neupogen on 6/28. -Not sure if lab monitoring is necessary but defer to oncology   Elevated troponin: Likely demand.  No significant cardiopulmonary symptoms.  No ischemic finding on EKG. CAD s/p CABG: Stable -Continue home meds except a statin for now.   Chronic combined CHF: TTE on 6/2 with LVEF of 35 to 40%, G1 DD, RWMA, severe LAE and normal RVSP.  Appears euvolemic on exam except for abdominal ascites.  On Lasix 20 mg as needed.  Excellent urine output. -Strict intake and output -Daily weight -Monitor renal functions.   Controlled IDDM-2: A1c 7.0% on 12/29/2021.  Seems to be on Lantus at home.   Recent Labs  Lab 03/06/22 1159 03/06/22 1645 03/06/22 2104 03/07/22 0733 03/07/22 1218  GLUCAP 136* 155* 176* 137* 118*  -Continue SSI-moderate -Continue Semglee 10 units daily  Hypotension: Improved after starting midodrine. -Continue midodrine.   BPH with hesitancy -Continue home tamsulosin  Language barrier affecting communication -Video interpreter utilized.  Thrombocytopenia: Seems to be acute on chronic.  No evidence of bleeding.  Relatively stable. Recent Labs  Lab 03/01/22 1423 03/02/22 0324 03/03/22 0321 03/04/22 0405  03/05/22 1517 03/06/22 0403 03/07/22 0331  PLT 56* 51* 50* 51* 54* 50* 55*  -Continue monitoring  Hypokalemia: Resolved.  Goal of care counseling: DNR/DNI confirmed and appropriate.  Very grim prognosis.  Interested in residential hospice but did not qualify. -Plan to go home with home hospice on 6/29   Body mass index is 22.69 kg/m.   DVT prophylaxis:  SCDs Start: 03/01/22 1702  Code Status: DNR/DNI Family Communication: Updated patient's wife at bedside. Level of care: Med-Surg Status is: Inpatient The patient will remain inpatient because: Failure to thrive due to metastatic gastric cancer   Final disposition: Likely home with home hospice on 6/29. Consultants:  Oncology Interventional radiology Palliative medicine  Sch Meds:  Scheduled Meds:  Chlorhexidine Gluconate Cloth  6 each Topical Daily   diclofenac Sodium  2 g Topical QID   feeding supplement  237 mL Oral BID BM   insulin aspart  0-15 Units Subcutaneous TID WC   insulin glargine-yfgn  5 Units Subcutaneous Daily   melatonin  5 mg Oral QHS   metoprolol tartrate  12.5 mg Oral BID   midodrine  10 mg Oral TID WC   pantoprazole  40 mg Oral BID   simethicone  80 mg Oral QID   tamsulosin  0.4  mg Oral QHS   Continuous Infusions: PRN Meds:.acetaminophen **OR** [DISCONTINUED] acetaminophen, guaiFENesin-dextromethorphan, lip balm, LORazepam, ondansetron **OR** ondansetron (ZOFRAN) IV, oxyCODONE, sodium chloride flush  Antimicrobials: Anti-infectives (From admission, onward)    None        I have personally reviewed the following labs and images: CBC: Recent Labs  Lab 03/01/22 1423 03/02/22 0324 03/03/22 0321 03/04/22 0405 03/05/22 1517 03/06/22 0403 03/07/22 0331  WBC 5.5   < > 5.9 5.2 2.6* 2.0* 1.4*  NEUTROABS 4.5  --   --   --  2.0 1.3* 0.8*  HGB 10.5*   < > 8.6* 8.3* 8.2* 9.3* 9.8*  HCT 31.6*   < > 26.8* 25.2* 25.0* 27.9* 29.3*  MCV 106.4*   < > 107.6* 106.8* 106.4* 102.2* 102.8*  PLT 56*    < > 50* 51* 54* 50* 55*   < > = values in this interval not displayed.   BMP &GFR Recent Labs  Lab 03/01/22 1423 03/02/22 0324 03/03/22 0321 03/04/22 0405 03/05/22 1517 03/06/22 0403 03/07/22 0331  NA 139   < > 139 141 138 140 142  K 3.9   < > 3.9 4.0 3.7 3.9 3.8  CL 111   < > 114* 115* 112* 113* 115*  CO2 21*   < > 19* 21* 20* 21* 20*  GLUCOSE 110*   < > 195* 180* 176* 164* 142*  BUN 29*   < > 27* 30* 26* 32* 37*  CREATININE 1.20   < > 1.15 1.23 1.08 1.15 1.16  CALCIUM 8.9   < > 8.4* 8.6* 8.6* 8.6* 8.7*  MG 2.0  --  1.9 2.0  --   --   --   PHOS  --   --  3.2 4.1  --   --   --    < > = values in this interval not displayed.   Estimated Creatinine Clearance: 59.3 mL/min (by C-G formula based on SCr of 1.16 mg/dL). Liver & Pancreas: Recent Labs  Lab 03/03/22 0321 03/04/22 0405 03/05/22 1517 03/06/22 0403 03/07/22 0331  AST 247* 207* 119* 92* 71*  ALT 168* 175* 166* 151* 137*  ALKPHOS 177* 179* 208* 200* 229*  BILITOT 1.3* 1.9* 2.2* 1.8* 1.8*  PROT 4.7* 4.5* 4.7* 4.5* 4.5*  ALBUMIN 2.6* 2.3* 2.4* 2.2* 2.2*   No results for input(s): "LIPASE", "AMYLASE" in the last 168 hours. Recent Labs  Lab 03/01/22 1423  AMMONIA 23   Diabetic: No results for input(s): "HGBA1C" in the last 72 hours. Recent Labs  Lab 03/06/22 1159 03/06/22 1645 03/06/22 2104 03/07/22 0733 03/07/22 1218  GLUCAP 136* 155* 176* 137* 118*   Cardiac Enzymes: Recent Labs  Lab 03/01/22 2357 03/03/22 0321 03/04/22 0405 03/06/22 1805  CKTOTAL 3,976* 2,622* 2,465* 338   No results for input(s): "PROBNP" in the last 8760 hours. Coagulation Profile: Recent Labs  Lab 03/02/22 0324 03/03/22 0321 03/04/22 0405  INR 1.2 1.2 1.2   Thyroid Function Tests: No results for input(s): "TSH", "T4TOTAL", "FREET4", "T3FREE", "THYROIDAB" in the last 72 hours. Lipid Profile: No results for input(s): "CHOL", "HDL", "LDLCALC", "TRIG", "CHOLHDL", "LDLDIRECT" in the last 72 hours. Anemia Panel: No  results for input(s): "VITAMINB12", "FOLATE", "FERRITIN", "TIBC", "IRON", "RETICCTPCT" in the last 72 hours. Urine analysis:    Component Value Date/Time   COLORURINE YELLOW 03/02/2022 0353   APPEARANCEUR HAZY (A) 03/02/2022 0353   LABSPEC 1.030 03/02/2022 0353   PHURINE 6.0 03/02/2022 0353   GLUCOSEU NEGATIVE 03/02/2022 0353   HGBUR  LARGE (A) 03/02/2022 [place] NEGATIVE 03/02/2022 [place] 03/02/2022 0353   PROTEINUR 100 (A) 03/02/2022 0353   NITRITE NEGATIVE 03/02/2022 0353   LEUKOCYTESUR SMALL (A) 03/02/2022 0353   Sepsis Labs: Invalid input(s): "PROCALCITONIN", "LACTICIDVEN"  Microbiology: Recent Results (from the past 240 hour(s))  Culture, body fluid w Gram Stain-bottle     Status: None   Collection Time: 03/02/22 11:56 AM   Specimen: Peritoneal Washings  Result Value Ref Range Status   Specimen Description PERITONEAL  Final   Special Requests PERITHONEAL CYTO  Final   Culture   Final    NO GROWTH 5 DAYS Performed at [facility], [address] [place], [place] [address]    Report Status 03/07/2022 FINAL  Final  Gram stain     Status: None   Collection Time: 03/02/22 11:56 AM   Specimen: Peritoneal Washings  Result Value Ref Range Status   Specimen Description PERITONEAL  Final   Special Requests PERITHONEAL CYTO  Final   Gram Stain   Final    FEW WBC PRESENT, PREDOMINANTLY MONONUCLEAR NO ORGANISMS SEEN Performed at [facility], [place] [address] [place], [place] [address]    Report Status 03/02/2022 FINAL  Final    Radiology Studies: [place] ASCITES (ABDOMEN LIMITED)  Result Date: 03/07/2022 CLINICAL DATA:  71 year old male referred for possible paracentesis EXAM: LIMITED ABDOMEN ULTRASOUND FOR ASCITES TECHNIQUE: Limited ultrasound survey for ascites was performed in all four abdominal quadrants. COMPARISON:  None Available. FINDINGS: Limited ultrasound demonstrates scant ascites. Paracentesis not performed. IMPRESSION:  Ultrasound demonstrates scant ascites. Electronically Signed   By: Corrie Mckusick D.O.   On: 03/07/2022 13:02      Shinika Estelle T. [place]  If 7PM-7AM, please contact night-coverage [email or web address] 03/07/2022, 4:43 PM

## 2022-03-07 NOTE — Progress Notes (Signed)
Interventional Radiology asked to evaluate this patient for a possible paracentesis. Limited ultrasound imaging of the abdomen revealed insufficient fluid to perform this procedure. No procedure done - images saved in Epic. Dr. Earleen Newport made aware.  Soyla Dryer, [place] [phone] 03/07/2022, 11:48 AM

## 2022-03-07 NOTE — Progress Notes (Signed)
Patient wife arrived at shift change and fed patient small amount of dinner. Patient has no complaints at this time and wishes to sleep.

## 2022-03-08 ENCOUNTER — Ambulatory Visit: Payer: Medicare Other | Admitting: Radiation Oncology

## 2022-03-08 DIAGNOSIS — R531 Weakness: Secondary | ICD-10-CM | POA: Diagnosis not present

## 2022-03-08 DIAGNOSIS — D61818 Other pancytopenia: Secondary | ICD-10-CM

## 2022-03-08 DIAGNOSIS — C169 Malignant neoplasm of stomach, unspecified: Secondary | ICD-10-CM | POA: Diagnosis not present

## 2022-03-08 DIAGNOSIS — R180 Malignant ascites: Secondary | ICD-10-CM | POA: Diagnosis not present

## 2022-03-08 DIAGNOSIS — D500 Iron deficiency anemia secondary to blood loss (chronic): Secondary | ICD-10-CM | POA: Diagnosis not present

## 2022-03-08 LAB — COMPREHENSIVE METABOLIC PANEL
ALT: 119 U/L — ABNORMAL HIGH (ref 0–44)
AST: 53 U/L — ABNORMAL HIGH (ref 15–41)
Albumin: 2.1 g/dL — ABNORMAL LOW (ref 3.5–5.0)
Alkaline Phosphatase: 244 U/L — ABNORMAL HIGH (ref 38–126)
Anion gap: 6 (ref 5–15)
BUN: 40 mg/dL — ABNORMAL HIGH (ref 8–23)
CO2: 20 mmol/L — ABNORMAL LOW (ref 22–32)
Calcium: 8.5 mg/dL — ABNORMAL LOW (ref 8.9–10.3)
Chloride: 116 mmol/L — ABNORMAL HIGH (ref 98–111)
Creatinine, Ser: 1.34 mg/dL — ABNORMAL HIGH (ref 0.61–1.24)
GFR, Estimated: 57 mL/min — ABNORMAL LOW (ref >60)
Glucose, Bld: 142 mg/dL — ABNORMAL HIGH (ref 70–99)
Potassium: 3.8 mmol/L (ref 3.5–5.1)
Sodium: 142 mmol/L (ref 135–145)
Total Bilirubin: 1.8 mg/dL — ABNORMAL HIGH (ref 0.3–1.2)
Total Protein: 4.5 g/dL — ABNORMAL LOW (ref 6.5–8.1)

## 2022-03-08 LAB — GLUCOSE, CAPILLARY: Glucose-Capillary: 198 mg/dL — ABNORMAL HIGH (ref 70–99)

## 2022-03-08 MED ORDER — LORAZEPAM 0.5 MG PO TABS: 0.5000 mg | ORAL_TABLET | Freq: Four times a day (QID) | ORAL | 0 refills | Status: DC | PRN

## 2022-03-08 MED ORDER — MIDODRINE HCL 10 MG PO TABS: 10.0000 mg | ORAL_TABLET | Freq: Three times a day (TID) | ORAL | 0 refills | Status: AC

## 2022-03-08 MED ORDER — SIMETHICONE 80 MG PO CHEW: 80.0000 mg | CHEWABLE_TABLET | Freq: Four times a day (QID) | ORAL | 0 refills | Status: DC | PRN

## 2022-03-08 MED ORDER — HEPARIN SOD (PORK) LOCK FLUSH 100 UNIT/ML IV SOLN
500.0000 [IU] | INTRAVENOUS | Status: AC | PRN
  Administered 2022-03-08: 500 [IU]
  Filled 2022-03-08: qty 5

## 2022-03-08 MED ORDER — ONDANSETRON 4 MG PO TBDP: 4.0000 mg | ORAL_TABLET | Freq: Three times a day (TID) | ORAL | 0 refills | Status: DC | PRN

## 2022-03-08 MED ORDER — OXYCODONE HCL 5 MG PO TABS
5.0000 mg | ORAL_TABLET | Freq: Three times a day (TID) | ORAL | 0 refills | Status: DC | PRN
Start: 2022-03-08 — End: 2022-11-23

## 2022-03-08 NOTE — Discharge Summary (Signed)
Physician Discharge Summary  Jimmy Blankenship VZS:[id number] DOB: 08-Apr-1951 DOA: 03/01/2022  PCP: [name], MD  Admit date: 03/01/2022 Discharge date: 03/08/2022 Admitted From: Home Disposition: Home with home hospice  Discharge Condition: Stable but guarded prognosis CODE STATUS: DNR/DNI   Hospital course 71 y.o. 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   In ED, stable vitals. Cr 1.2 (about baseline).  AST 408.  ALT 194.  Total bili 1.8.  ALP 194.  Troponin 50.  Hgb 10.5 (about baseline).  EKG features sinus rhythm.  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  LE venous Doppler negative for DVT.    Patient had paracentesis with removal of 3.5 L.  Fluid study negative for SBP.  Oncology evaluated patient and recommended residential hospice.  Palliative medicine met with the patient and family. Family interested in residential hospice but patient did not qualify for residential hospice.  He is discharged home with home hospice on 03/08/2022.   Of note, oncology ordered peritoneal catheter placement but repeat abdominal ultrasound did not show enough reaccumulation of ascites to perform procedure.  See individual problem list below for more.   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  S/p paracentesis with removal of 3.5 L on 6/23.  Fluid study negative for SBP. -Peritoneal catheter placement requested by oncology but no sufficient fluid for procedure per IR.   Elevated liver enzymes/hyperbilirubinemia: Likely due to rhabdo.  CK 4000>> 338.  No trauma or fall.  He was on a statin.  Improved. -Discontinued statin on discharge   Nontraumatic rhabdomyolysis: Due to statin?  Resolved.  Discontinued statin  Pancytopenia-worsening leukopenia.  Thrombocytopenia stable. IDA due to chronic BLA/UGI bleed/gastritis: EGD on 6/8 showed gastritis.  Stable after 1unit. Recent Labs (within last 365 days)              Recent Labs    02/13/22 0500 02/14/22 0500 02/20/22 0945 03/01/22 1423 03/02/22 0324 03/03/22 0321 03/04/22 0405 03/05/22 1517 03/06/22 0403 03/07/22 0331  HGB 9.7* 9.8* 11.6* 10.5* 9.0* 8.6* 8.3* 8.2* 9.3* 9.8*    -Continue Protonix -Received Neupogen on 6/28.   Elevated troponin: Likely demand.  No ischemic finding on EKG. CAD s/p CABG: Stable -Continue home meds except a statin   Chronic combined CHF: TTE on 6/2 with LVEF of 35 to 40%,  G1 DD, RWMA, severe LAE and normal RVSP.  Appears euvolemic on exam except for abdominal ascites.  On Lasix 20 mg as needed.  Excellent urine output off diuretics.  Lasix discontinued   Controlled IDDM-2: A1c 7.0% on 12/29/2021.  Seems  to be on Lantus at home.   -Continue home Lantus   Hypotension/sinus tachycardia: Improved after starting midodrine. -Continue midodrine and metoprolol.   BPH with hesitancy -Continue home tamsulosin   Language barrier affecting communication -Video interpreter utilized.   Acute on chronic thrombocytopenia: Relatively stable. Last Labs            Recent Labs  Lab 03/01/22 1423 03/02/22 0324 03/03/22 0321 03/04/22 0405 03/05/22 1517 03/06/22 0403 03/07/22 0331  PLT 56* 51* 50* 51* 54* 50* 55*    -Continue monitoring   Hypokalemia: Resolved.   Goal of care counseling: DNR/DNI confirmed and appropriate.  Very grim prognosis.  Interested in residential hospice but did not qualify.  Discharged home with home hospice     Vital signs Vitals:   03/07/22 1213 03/07/22 2036 03/08/22 0326 03/08/22 1037  BP: 96/64 (!) _0  Pulse: 78 88 93 (!) 118  Temp: 97.8 F (36.6 C) 97.8 F (36.6 C) 97.7 F (36.5 C) 98.2 F (36.8 C)  Resp: _1 Height:      Weight:      SpO2: 98% 95% 96% 96%  TempSrc: Oral   Oral  BMI (Calculated):         Discharge exam  GENERAL: Appears frail.  Nontoxic. HEENT: MMM.  Vision and hearing grossly intact.  NECK: Supple.  No apparent JVD.  RESP:  No IWOB.  Fair aeration bilaterally. CVS:  RRR. Heart sounds normal.  ABD/GI/GU: BS+. Abd soft, NTND.  MSK/EXT:  Moves extremities. No apparent deformity. No edema.  SKIN: no apparent skin lesion or wound NEURO: Awake and alert. Oriented appropriately.  No apparent focal neuro deficit. PSYCH: Calm. Normal affect.   Discharge Instructions Discharge Instructions     Diet - low sodium heart healthy   Complete by: As directed    Dysphagia 3 (Mech soft);Thin liquid  Liquid Administration via: Cup;No straw Medication Administration: Crushed with puree Supervision: Patient able to self feed Compensations: Slow rate;Small sips/bites;Multiple dry swallows after each  bite/sip Postural Changes: Seated upright at 90 degrees;Remain upright for at least 30 minutes after po intake   Increase activity slowly   Complete by: As directed       Allergies as of 03/08/2022   No Known Allergies      Medication List     STOP taking these medications    atorvastatin 40 MG tablet Commonly known as: LIPITOR   capecitabine 500 MG tablet Commonly known as: XELODA   dexamethasone 4 MG tablet Commonly known as: DECADRON   diphenhydrAMINE 25 MG tablet Commonly known as: BENADRYL   diphenoxylate-atropine 2.5-0.025 MG tablet Commonly known as: LOMOTIL   docusate sodium 100 MG capsule Commonly known as: COLACE   furosemide 20 MG tablet Commonly known as: Lasix   lidocaine-prilocaine cream Commonly known as: EMLA   ondansetron 8 MG tablet Commonly known as: Zofran   prochlorperazine 10 MG tablet Commonly known as: COMPAZINE       TAKE these medications    bisacodyl 10 MG suppository Commonly known as: Dulcolax Place 1 suppository (10 mg total) rectally as needed for moderate constipation.   feeding supplement Liqd Take 237 mLs by mouth 2 (two) times daily between meals.  What changed: when to take this   gabapentin 300 MG capsule Commonly known as: NEURONTIN Take 1 capsule (300 mg total) by mouth 4 (four) times daily.   insulin glargine 100 UNIT/ML Solostar Pen Commonly known as: LANTUS Inject 10-15 Units into the skin daily as needed (for High CBG durnig chemo may  take BID).   LORazepam 0.5 MG tablet Commonly known as: Ativan Take 1 tablet (0.5 mg total) by mouth every 6 (six) hours as needed for up to 20 doses for anxiety. What changed: reasons to take this   megestrol 400 MG/10ML suspension Commonly known as: MEGACE Take 10 mLs (400 mg total) by mouth 2 (two) times daily. What changed:  when to take this reasons to take this   melatonin 5 MG Tabs Take 5 mg by mouth at bedtime.   metoprolol tartrate 25 MG tablet Commonly  known as: LOPRESSOR Take 0.5 tablets (12.5 mg total) by mouth 2 (two) times daily.   midodrine 10 MG tablet Commonly known as: PROAMATINE Take 1 tablet (10 mg total) by mouth 3 (three) times daily with meals.   ondansetron 4 MG disintegrating tablet Commonly known as: ZOFRAN-ODT Take 1 tablet (4 mg total) by mouth every 8 (eight) hours as needed for nausea or vomiting.   oxyCODONE 5 MG immediate release tablet Commonly known as: Oxy IR/ROXICODONE Take 1 tablet (5 mg total) by mouth every 8 (eight) hours as needed for moderate pain or severe pain.   pantoprazole 40 MG tablet Commonly known as: PROTONIX Take 1 tablet (40 mg total) by mouth 2 (two) times daily. Follow up with gastroenterology for additional instructions   polyethylene glycol 17 g packet Commonly known as: MIRALAX / GLYCOLAX Take 17 g by mouth daily as needed for mild constipation.   simethicone 80 MG chewable tablet Commonly known as: MYLICON Chew 1 tablet (80 mg total) by mouth 4 (four) times daily as needed for flatulence.   tamsulosin 0.4 MG Caps capsule Commonly known as: FLOMAX Take 0.4 mg by mouth at bedtime.        Consultations: Oncology Interventional radiology Palliative medicine  Procedures/Studies: US guided paracentesis with removal of 3.5 L hazy amber fluid.   [place] ASCITES (ABDOMEN LIMITED)  Result Date: 03/07/2022 CLINICAL DATA:  71 year old male referred for possible paracentesis EXAM: LIMITED ABDOMEN ULTRASOUND FOR ASCITES TECHNIQUE: Limited ultrasound survey for ascites was performed in all four abdominal quadrants. COMPARISON:  None Available. FINDINGS: Limited ultrasound demonstrates scant ascites. Paracentesis not performed. IMPRESSION: Ultrasound demonstrates scant ascites. Electronically Signed   By: Corrie Mckusick D.O.   On: 03/07/2022 13:02   US Paracentesis  Result Date: 03/02/2022 INDICATION: Patient with history of metastatic gastric adenocarcinoma with recurrent ascites.  Request for therapeutic and diagnostic paracentesis. EXAM: ULTRASOUND GUIDED  PARACENTESIS MEDICATIONS: 10 mL 1% lidocaine COMPLICATIONS: None immediate. PROCEDURE: Informed written consent was obtained from the patient after a discussion of the risks, benefits and alternatives to treatment. A timeout was performed prior to the initiation of the procedure. Initial ultrasound scanning demonstrates a large amount of ascites within the right lower abdominal quadrant. The right lower abdomen was prepped and draped in the usual sterile fashion. 1% lidocaine was used for local anesthesia. Following this, a 19 gauge, 10-cm, Yueh catheter was introduced. An ultrasound image was saved for documentation purposes. The paracentesis was performed. The catheter was removed and a dressing was applied. The patient tolerated the procedure well without immediate post procedural complication. FINDINGS: A total of approximately 3.5 L of  hazy amber fluid was removed. Samples were sent to the laboratory as requested by the clinical team. IMPRESSION: Successful ultrasound-guided paracentesis yielding 3.5 liters of peritoneal fluid. Determined by Electronically Signed   By: Corrie Mckusick D.O.   On: 03/02/2022 13:48   VAS [place] LOWER EXTREMITY VENOUS (DVT) (7a-7p)  Result Date: 03/01/2022  Lower Venous DVT Study Patient Name:  [facility]  Date of Exam:   03/01/2022 Medical Rec #: [id number]      Accession #:    [id number] Date of Birth: 19-Mar-1951      Patient Gender: M Patient Age:   48 years Exam Location:  [facility] Procedure:      VAS [place] LOWER EXTREMITY VENOUS (DVT) Referring Phys: JULIE HAVILAND --------------------------------------------------------------------------------  Indications: Edema.  Risk Factors: Cancer. Limitations: Poor ultrasound/tissue interface and calcific shadowing. Comparison Study: No previous exams Performing Technologist: Jody Hill RVT, RDMS  Examination Guidelines: A complete evaluation includes  B-mode imaging, spectral Doppler, color Doppler, and power Doppler as needed of all accessible portions of each vessel. Bilateral testing is considered an integral part of a complete examination. Limited examinations for reoccurring indications may be performed as noted. The reflux portion of the exam is performed with the patient in reverse Trendelenburg.  +---------+---------------+---------+-----------+----------+--------------+ RIGHT    CompressibilityPhasicitySpontaneityPropertiesThrombus Aging +---------+---------------+---------+-----------+----------+--------------+ CFV      Full           Yes      Yes                                 +---------+---------------+---------+-----------+----------+--------------+ SFJ      Full                                                        +---------+---------------+---------+-----------+----------+--------------+ FV Prox  Full           Yes      Yes                                 +---------+---------------+---------+-----------+----------+--------------+ FV Mid   Full           Yes      Yes                                 +---------+---------------+---------+-----------+----------+--------------+ FV DistalFull           Yes      Yes                                 +---------+---------------+---------+-----------+----------+--------------+ PFV      Full                                                        +---------+---------------+---------+-----------+----------+--------------+ POP      Full           Yes      Yes                                 +---------+---------------+---------+-----------+----------+--------------+  PTV      Full                                                        +---------+---------------+---------+-----------+----------+--------------+ PERO     Full                                                         +---------+---------------+---------+-----------+----------+--------------+   +---------+---------------+---------+-----------+----------+-------------------+ LEFT     CompressibilityPhasicitySpontaneityPropertiesThrombus Aging      +---------+---------------+---------+-----------+----------+-------------------+ CFV      Full           Yes      Yes                                      +---------+---------------+---------+-----------+----------+-------------------+ SFJ      Full                                                             +---------+---------------+---------+-----------+----------+-------------------+ FV Prox  Full           Yes      Yes                                      +---------+---------------+---------+-----------+----------+-------------------+ FV Mid   Full           Yes      Yes                                      +---------+---------------+---------+-----------+----------+-------------------+ FV DistalFull           Yes      Yes                                      +---------+---------------+---------+-----------+----------+-------------------+ PFV      Full                                                             +---------+---------------+---------+-----------+----------+-------------------+ POP      Full           Yes      Yes                                      +---------+---------------+---------+-----------+----------+-------------------+ PTV      Full                                                             +---------+---------------+---------+-----------+----------+-------------------+  PERO                                                  Not well visualized +---------+---------------+---------+-----------+----------+-------------------+     Summary: BILATERAL: - No evidence of deep vein thrombosis seen in the lower extremities, bilaterally. -No evidence of popliteal cyst, bilaterally   *See table(s)  above for measurements and observations. Electronically signed by Jamelle Haring on 03/01/2022 at 8:25:28 PM.    Final    CT CHEST ABDOMEN PELVIS W CONTRAST  Result Date: 03/01/2022 CLINICAL DATA:  Metastatic gastric cancer, assess treatment response, progressive weakness * Tracking Code: BO * EXAM: CT CHEST, ABDOMEN, AND PELVIS WITH CONTRAST TECHNIQUE: Multidetector CT imaging of the chest, abdomen and pelvis was performed following the standard protocol during bolus administration of intravenous contrast. RADIATION DOSE REDUCTION: This exam was performed according to the departmental dose-optimization program which includes automated exposure control, adjustment of the mA and/or kV according to patient size and/or use of iterative reconstruction technique. CONTRAST:  156m OMNIPAQUE IOHEXOL 300 MG/ML SOLN additional oral enteric contrast COMPARISON:  CT abdomen pelvis, 12/22/2021, CT chest abdomen pelvis, 11/17/2021 FINDINGS: CT CHEST FINDINGS Cardiovascular: Right chest port catheter. Aortic atherosclerosis. Normal heart size. Three-vessel coronary artery calcifications status post median sternotomy and CABG. No pericardial effusion. Mediastinum/Nodes: No enlarged mediastinal, hilar, or axillary lymph nodes. Thyroid gland, trachea, and esophagus demonstrate no significant findings. Lungs/Pleura: Unchanged, bandlike scarring and or atelectasis of the bilateral lung bases. Unchanged fissural nodule of the superior segment right lower lobe measuring 0.3 cm, stable and benign (series 4, image 70). Mild paraseptal emphysema. No pleural effusion or pneumothorax. Musculoskeletal: No chest wall mass or suspicious osseous lesions identified. CT ABDOMEN PELVIS FINDINGS Hepatobiliary: Unchanged, very subtle hypodense metastases of the left lobe of the liver, hepatic segment II, measuring 1.1 x 1.0 cm (series 2, image 43) and in the posterior right lobe of the liver, hepatic segment VI measuring 1.1 x 0.9 cm (series 2,  image 55). No gallstones, gallbladder wall thickening, or biliary dilatation. Pancreas: Unremarkable. No pancreatic ductal dilatation or surrounding inflammatory changes. Spleen: Normal in size without significant abnormality. Adrenals/Urinary Tract: Adrenal glands are unremarkable. Lobulated renal cortical scarring. Simple, benign renal cortical cysts of the inferior pole of the right kidney, for which no further follow-up or characterization is required. Bladder is unremarkable. Stomach/Bowel: Unchanged, treated mass of the lesser curvature of the stomach measuring approximately 7.1 x 2.9 cm (series 2, image 53). Appendix appears normal. No evidence of bowel wall thickening, distention, or inflammatory changes. Vascular/Lymphatic: Aortic atherosclerosis. No enlarged abdominal or pelvic lymph nodes. Reproductive: No mass or other abnormality. Other: No abdominal wall hernia or abnormality. Significantly increased, large volume ascites throughout the abdomen and pelvis, with stranding of the omentum and peritoneum (series 2, image 54) Musculoskeletal: No acute osseous findings. IMPRESSION: 1. Unchanged, treated mass of the lesser curvature of the stomach, in keeping with known gastric malignancy. 2. Unchanged, very subtle liver metastases.  No new liver lesions. 3. Significantly increased, large volume ascites throughout the abdomen and pelvis, with stranding of the omentum and peritoneum, suspicious for peritoneal metastatic disease but without overt nodularity. 4. No evidence of metastatic disease in the chest. Aortic Atherosclerosis (ICD10-I70.0) and Emphysema (ICD10-J43.9). Electronically Signed   By: ADelanna AhmadiM.D.   On: 03/01/2022 14:41   IR Paracentesis  Result Date:  02/23/2022 INDICATION: Metastatic gastric adenocarcinoma with ascites. Request for and therapeutic paracentesis. EXAM: ULTRASOUND GUIDED PARACENTESIS MEDICATIONS: 1% lidocaine 8 mL COMPLICATIONS: None immediate. PROCEDURE: Informed  written consent was obtained from the patient after a discussion of the risks, benefits and alternatives to treatment. A timeout was performed prior to the initiation of the procedure. Initial ultrasound scanning demonstrates a large amount of ascites within the right lower abdominal quadrant. The right lower abdomen was prepped and draped in the usual sterile fashion. 1% lidocaine was used for local anesthesia. Following this, a 19 gauge, 7-cm, Yueh catheter was introduced. An ultrasound image was saved for documentation purposes. The paracentesis was performed. The catheter was removed and a dressing was applied. The patient tolerated the procedure well without immediate post procedural complication. Patient received post-procedure intravenous albumin; see nursing notes for details. FINDINGS: A total of approximately 4.4 L of clear yellow fluid was removed. Samples were sent to the laboratory as requested by the clinical team. IMPRESSION: Successful ultrasound-guided paracentesis yielding 4.4 liters of peritoneal fluid. Procedure performed by: Gareth Eagle, PA-C Electronically Signed   By: Jacqulynn Cadet M.D.   On: 02/23/2022 15:39   US Paracentesis  Result Date: 02/13/2022 INDICATION: History of metastatic gastric adenocarcinoma with new onset ascites. Patient was referred to IR for diagnostic and therapeutic paracentesis. EXAM: ULTRASOUND GUIDED DIAGNOSTIC AND THERAPEUTIC RIGHT LOWER QUADRANT PARACENTESIS MEDICATIONS: 10 mL 1 % lidocaine COMPLICATIONS: None immediate. PROCEDURE: Informed written consent was obtained from the patient after a discussion of the risks, benefits and alternatives to treatment. A timeout was performed prior to the initiation of the procedure. Initial ultrasound scanning demonstrates a large amount of ascites within the right lower abdominal quadrant. The right lower abdomen was prepped and draped in the usual sterile fashion. 1% lidocaine was used for local anesthesia. Following  this, a 19 gauge, 7-cm, Yueh catheter was introduced. An ultrasound image was saved for documentation purposes. The paracentesis was performed. The catheter was removed and a dressing was applied. The patient tolerated the procedure well without immediate post procedural complication. FINDINGS: A total of approximately 4.4 L of clear, yellow fluid was removed. Samples were sent to the laboratory as requested by the clinical team. IMPRESSION: Successful ultrasound-guided paracentesis yielding 4.4 liters of peritoneal fluid. Read by: Narda Rutherford, AGNP-BC Electronically Signed   By: Michaelle Birks M.D.   On: 02/13/2022 17:29   [place] ASCITES (ABDOMEN LIMITED)  Result Date: 02/12/2022 CLINICAL DATA:  Ascites.  Gastric cancer. EXAM: LIMITED ABDOMEN ULTRASOUND FOR ASCITES TECHNIQUE: Limited ultrasound survey for ascites was performed in all four abdominal quadrants. COMPARISON:  CT abdomen 12/22/2021 FINDINGS: There is a moderate to large amount of ascites in the abdomen. This represents a substantial increase compared to 12/22/2021. IMPRESSION: 1. Moderate to large amount of ascites, increased from April 2023. Electronically Signed   By: Van Clines M.D.   On: 02/12/2022 20:05   DG Abd Portable 1V  Result Date: 02/12/2022 CLINICAL DATA:  [id number]; follow-up ileus EXAM: PORTABLE ABDOMEN - 1 VIEW COMPARISON:  Radiograph dated February 11, 2022 FINDINGS: Air-filled nondilated loops of bowel. Incomplete assessment of the pelvis and lung bases. Degenerative changes of the lower lumbar spine. IMPRESSION: Nonobstructive bowel gas pattern. Electronically Signed   By: Valentino Saxon M.D.   On: 02/12/2022 08:53   DG Abd 1 View  Result Date: 02/11/2022 CLINICAL DATA:  Abdominal distension EXAM: ABDOMEN - 1 VIEW COMPARISON:  February 20, 2021 FINDINGS: Evaluation is limited due to supine imaging. Within this  limitation, no free air, portal venous gas, or pneumatosis is identified. The stomach is air-filled and mildly prominent.  Otherwise, the bowel gas pattern is nonobstructive. Large and small bowel loops are normal in caliber. No other acute abnormalities. IMPRESSION: Large and small bowel loops are normal in caliber. The stomach contains air and is mildly prominent. No other abnormalities. Electronically Signed   By: Dorise Bullion III M.D.   On: 02/11/2022 12:42   ECHOCARDIOGRAM COMPLETE  Result Date: 02/10/2022    ECHOCARDIOGRAM REPORT   Patient Name:   Jimmy Blankenship Date of Exam: 02/09/2022 Medical Rec #:  [id number]     Height:       69.0 in Accession #:    [id number]    Weight:       147.0 lb Date of Birth:  1951/04/03     BSA:          1.813 m Patient Age:    12 years      BP:           99/61 mmHg Patient Gender: M             HR:           105 bpm. Exam Location:  Inpatient Procedure: 2D Echo Indications:    Chemo. History of intracardiac thrombus.  History:        Patient has prior history of Echocardiogram examinations, most                 recent 08/28/2021. CAD, Cancer; Risk Factors:Dyslipidemia,                 Hypertension and Diabetes.  Sonographer:    Joette Catching Referring Phys: [place]  1. No left ventricular thrombus is seen with Definity contrast. Left ventricular ejection fraction, by estimation, is 35 to 40%. The left ventricle has moderately decreased function. The left ventricle demonstrates regional wall motion abnormalities (see scoring diagram/findings for description). Left ventricular diastolic parameters are consistent with Grade I diastolic dysfunction (impaired relaxation). There is dyskinesis of the entire left ventricular apex.  2. Right ventricular systolic function is normal. The right ventricular size is normal. There is normal pulmonary artery systolic pressure.  3. Left atrial size was severely dilated.  4. The mitral valve is normal in structure. Moderate mitral valve regurgitation.  5. The aortic valve is tricuspid. There is mild calcification of the aortic valve.  There is mild thickening of the aortic valve. Aortic valve regurgitation is not visualized. Aortic valve sclerosis is present, with no evidence of aortic valve stenosis.  6. The inferior vena cava is dilated in size with >50% respiratory variability, suggesting right atrial pressure of 8 mmHg. Comparison(s): No significant change from prior study. Prior images reviewed side by side. FINDINGS  Left Ventricle: No left ventricular thrombus is seen with Definity contrast. Left ventricular ejection fraction, by estimation, is 35 to 40%. The left ventricle has moderately decreased function. The left ventricle demonstrates regional wall motion abnormalities. Definity contrast agent was given IV to delineate the left ventricular endocardial borders. The left ventricular internal cavity size was normal in size. There is no left ventricular hypertrophy. Left ventricular diastolic parameters are consistent with Grade I diastolic dysfunction (impaired relaxation). Normal left ventricular filling pressure.  LV Wall Scoring: The entire apex is dyskinetic. Right Ventricle: The right ventricular size is normal. No increase in right ventricular wall thickness. Right ventricular systolic function is normal. There is normal pulmonary artery  systolic pressure. The tricuspid regurgitant velocity is 2.19 m/s, and  with an assumed right atrial pressure of 3 mmHg, the estimated right ventricular systolic pressure is 71.1 mmHg. Left Atrium: Left atrial size was severely dilated. Right Atrium: Right atrial size was normal in size. Pericardium: There is no evidence of pericardial effusion. Mitral Valve: The mitral valve is normal in structure. Moderate mitral valve regurgitation, with eccentric posteriorly directed jet. Tricuspid Valve: The tricuspid valve is normal in structure. Tricuspid valve regurgitation is mild. Aortic Valve: The aortic valve is tricuspid. There is mild calcification of the aortic valve. There is mild thickening of the  aortic valve. Aortic valve regurgitation is not visualized. Aortic valve sclerosis is present, with no evidence of aortic valve stenosis. Aortic valve mean gradient measures 4.0 mmHg. Aortic valve peak gradient measures 6.1 mmHg. Aortic valve area, by VTI measures 3.86 cm. Pulmonic Valve: The pulmonic valve was normal in structure. Pulmonic valve regurgitation is not visualized. Aorta: The aortic root is normal in size and structure. Venous: The inferior vena cava is dilated in size with greater than 50% respiratory variability, suggesting right atrial pressure of 8 mmHg. IAS/Shunts: No atrial level shunt detected by color flow Doppler. Additional Comments: A venous catheter is visualized in the right atrium. There is pleural effusion in the left lateral region.  LEFT VENTRICLE PLAX 2D LVIDd:         4.00 cm      Diastology LVIDs:         3.40 cm      LV e' lateral:   13.40 cm/s LV PW:         1.10 cm      LV E/e' lateral: 3.9 LV IVS:        1.10 cm LVOT diam:     2.20 cm LV SV:         81 LV SV Index:   45 LVOT Area:     3.80 cm  LV Volumes (MOD) LV vol d, MOD A2C: 114.0 ml LV vol d, MOD A4C: 123.0 ml LV vol s, MOD A2C: 63.9 ml LV vol s, MOD A4C: 85.8 ml LV SV MOD A2C:     50.1 ml LV SV MOD A4C:     123.0 ml LV SV MOD BP:      46.7 ml RIGHT VENTRICLE            IVC RV Basal diam:  2.40 cm    IVC diam: 2.00 cm RV Mid diam:    1.90 cm RV S prime:     8.27 cm/s TAPSE (M-mode): 0.6 cm LEFT ATRIUM             Index        RIGHT ATRIUM           Index LA diam:        4.90 cm 2.70 cm/m   RA Area:     16.40 cm LA Vol (A2C):   48.8 ml 26.92 ml/m  RA Volume:   35.20 ml  19.42 ml/m LA Vol (A4C):   52.3 ml 28.85 ml/m LA Biplane Vol: 51.7 ml 28.52 ml/m  AORTIC VALVE                     PULMONIC VALVE AV Area (Vmax):    3.83 cm      PV Vmax:       0.99 m/s AV Area (Vmean):   3.25 cm  PV Peak grad:  3.9 mmHg AV Area (VTI):     3.86 cm AV Vmax:           123.00 cm/s AV Vmean:          104.000 cm/s AV VTI:             0.211 m AV Peak Grad:      6.1 mmHg AV Mean Grad:      4.0 mmHg LVOT Vmax:         124.00 cm/s LVOT Vmean:        88.900 cm/s LVOT VTI:          0.214 m LVOT/AV VTI ratio: 1.01  AORTA Ao Root diam: 3.50 cm MITRAL VALVE                  TRICUSPID VALVE MV Area (PHT): 9.85 cm       TR Peak grad:   19.2 mmHg MV Decel Time: 77 msec        TR Vmax:        219.00 cm/s MR Peak grad:    106.5 mmHg MR Mean grad:    65.0 mmHg    SHUNTS MR Vmax:         516.00 cm/s  Systemic VTI:  0.21 m MR Vmean:        387.0 cm/s   Systemic Diam: 2.20 cm MR PISA:         0.57 cm MR PISA Eff ROA: 4 mm MR PISA Radius:  0.30 cm MV E velocity: 52.00 cm/s MV A velocity: 114.00 cm/s MV E/A ratio:  0.46 Mihai Croitoru MD Electronically signed by Sanda Klein MD Signature Date/Time: 02/10/2022/10:06:18 AM    Final        The results of significant diagnostics from this hospitalization (including imaging, microbiology, ancillary and laboratory) are listed below for reference.     Microbiology: Recent Results (from the past 240 hour(s))  Culture, body fluid w Gram Stain-bottle     Status: None   Collection Time: 03/02/22 11:56 AM   Specimen: Peritoneal Washings  Result Value Ref Range Status   Specimen Description PERITONEAL  Final   Special Requests PERITHONEAL CYTO  Final   Culture   Final    NO GROWTH 5 DAYS Performed at [facility], [address] [place], [place] [address]    Report Status 03/07/2022 FINAL  Final  Gram stain     Status: None   Collection Time: 03/02/22 11:56 AM   Specimen: Peritoneal Washings  Result Value Ref Range Status   Specimen Description PERITONEAL  Final   Special Requests PERITHONEAL CYTO  Final   Gram Stain   Final    FEW WBC PRESENT, PREDOMINANTLY MONONUCLEAR NO ORGANISMS SEEN Performed at [facility], [place] [address], [place], [place] [address]    Report Status 03/02/2022 FINAL  Final     Labs:  CBC: Recent Labs  Lab 03/03/22 0321 03/04/22 0405 03/05/22 1517  03/06/22 0403 03/07/22 0331  WBC 5.9 5.2 2.6* 2.0* 1.4*  NEUTROABS  --   --  2.0 1.3* 0.8*  HGB 8.6* 8.3* 8.2* 9.3* 9.8*  HCT 26.8* 25.2* 25.0* 27.9* 29.3*  MCV 107.6* 106.8* 106.4* 102.2* 102.8*  PLT 50* 51* 54* 50* 55*   BMP &GFR Recent Labs  Lab 03/03/22 0321 03/04/22 0405 03/05/22 1517 03/06/22 0403 03/07/22 0331 03/08/22 0307  NA 139 141 138 140 142 142  K 3.9 4.0 3.7  3.9 3.8 3.8  CL 114* 115* 112* 113* 115* 116*  CO2 19* 21* 20* 21* 20* 20*  GLUCOSE 195* 180* 176* 164* 142* 142*  BUN 27* 30* 26* 32* 37* 40*  CREATININE 1.15 1.23 1.08 1.15 1.16 1.34*  CALCIUM 8.4* 8.6* 8.6* 8.6* 8.7* 8.5*  MG 1.9 2.0  --   --   --   --   PHOS 3.2 4.1  --   --   --   --    Estimated Creatinine Clearance: 51.3 mL/min (A) (by C-G formula based on SCr of 1.34 mg/dL (H)). Liver & Pancreas: Recent Labs  Lab 03/04/22 0405 03/05/22 1517 03/06/22 0403 03/07/22 0331 03/08/22 0307  AST 207* 119* 92* 71* 53*  ALT 175* 166* 151* 137* 119*  ALKPHOS 179* 208* 200* 229* 244*  BILITOT 1.9* 2.2* 1.8* 1.8* 1.8*  PROT 4.5* 4.7* 4.5* 4.5* 4.5*  ALBUMIN 2.3* 2.4* 2.2* 2.2* 2.1*   No results for input(s): "LIPASE", "AMYLASE" in the last 168 hours. No results for input(s): "AMMONIA" in the last 168 hours. Diabetic: No results for input(s): "HGBA1C" in the last 72 hours. Recent Labs  Lab 03/07/22 0733 03/07/22 1218 03/07/22 1650 03/07/22 2038 03/08/22 0720  GLUCAP 137* 118* 226* 158* 198*   Cardiac Enzymes: Recent Labs  Lab 03/01/22 2357 03/03/22 0321 03/04/22 0405 03/06/22 1805  CKTOTAL 3,976* 2,622* 2,465* 338   No results for input(s): "PROBNP" in the last 8760 hours. Coagulation Profile: Recent Labs  Lab 03/02/22 0324 03/03/22 0321 03/04/22 0405  INR 1.2 1.2 1.2   Thyroid Function Tests: No results for input(s): "TSH", "T4TOTAL", "FREET4", "T3FREE", "THYROIDAB" in the last 72 hours. Lipid Profile: No results for input(s): "CHOL", "HDL", "LDLCALC", "TRIG", "CHOLHDL",  "LDLDIRECT" in the last 72 hours. Anemia Panel: No results for input(s): "VITAMINB12", "FOLATE", "FERRITIN", "TIBC", "IRON", "RETICCTPCT" in the last 72 hours. Urine analysis:    Component Value Date/Time   COLORURINE YELLOW 03/02/2022 0353   APPEARANCEUR HAZY (A) 03/02/2022 0353   LABSPEC 1.030 03/02/2022 0353   PHURINE 6.0 03/02/2022 0353   GLUCOSEU NEGATIVE 03/02/2022 0353   HGBUR LARGE (A) 03/02/2022 0353   BILIRUBINUR NEGATIVE 03/02/2022 [place] 03/02/2022 0353   PROTEINUR 100 (A) 03/02/2022 0353   NITRITE NEGATIVE 03/02/2022 0353   LEUKOCYTESUR SMALL (A) 03/02/2022 0353   Sepsis Labs: Invalid input(s): "PROCALCITONIN", "LACTICIDVEN"   SIGNED:  Mercy Riding, MD  [facility] 03/08/2022, 3:36 PM

## 2022-03-08 NOTE — Progress Notes (Signed)
HEMATOLOGY-ONCOLOGY PROGRESS NOTE  ASSESSMENT AND PLAN: 1.  Metastatic gastric cancer 2.  Ascites secondary to #1 3.  Macrocytic anemia 4.  Thrombocytopenia 5.  Transaminitis 6.  Generalized weakness/failure to thrive 7.  Chronic combined CHF 8.  Diabetes mellitus 9.  Goals of care  -Jimmy Blankenship will be discharging to home under hospice care.  They already have a hospital bed in place.  The patient and family are aware they may contact hospice for assistance with symptom management or may call Dr. Antonieta Pert office if they have questions or concerns. -At this time, symptoms are overall well controlled.  No additional recommendations for symptom management at this time.  Jimmy Blankenship  SUBJECTIVE: Jimmy Blankenship was seen today with the assistance of a video interpreter.  His wife is at the bedside.  He reports ongoing abdominal distention but no significant pain, nausea, vomiting.  He tells me the plan is to go home with hospice today.  He already has a hospital bed delivered.  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[place])   09/19/2020 - 01/04/2021 Chemotherapy    Patient is on Treatment Plan: GASTROESOPHAGEAL FAM-TRASTUZUMAB ZYSAYTKZSW-FUXN (ENHERTU) Q21D      09/19/2020 - 01/06/2021 Chemotherapy      Patient is on Antibody Plan: BREAST TRASTUZUMAB Q14D    02/01/2021 - 02/22/2022 Chemotherapy   Patient is on Treatment Plan : GASTROESOPHAGEAL fam-trastuzumab deruxtecan-nxki (Enhertu) q21d     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 ([place])  08/22/2020 Initial Diagnosis   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 ([place])   09/19/2020 - 01/06/2021 Chemotherapy      Patient is on Antibody Plan: BREAST TRASTUZUMAB Q14D    02/01/2021 - 02/22/2022 Chemotherapy   Patient is on Treatment Plan : GASTROESOPHAGEAL fam-trastuzumab deruxtecan-nxki (Enhertu) q21d     03/02/2021 Cancer Staging   Staging form: Stomach, AJCC 8th  Edition - Clinical stage from 03/02/2021: Stage IVB (cT3, cN2, cM1) - Signed by Volanda Napoleon, MD on 03/02/2021 Histologic grade (G): G2 Histologic grading system: 3 grade system      REVIEW OF SYSTEMS:   Review of Systems  Constitutional:  Positive for malaise/fatigue. Negative for chills and fever.  Respiratory: Negative.    Cardiovascular: Negative.   Gastrointestinal:  Negative for abdominal pain, nausea and vomiting.       Reports abdominal distention  Neurological: Negative.   Endo/Heme/Allergies: Negative.   Psychiatric/Behavioral: Negative.      I have reviewed the past medical history, past surgical history, social history and family history with the patient and they are unchanged from previous note.   PHYSICAL EXAMINATION: ECOG PERFORMANCE STATUS: 3 - Symptomatic, >50% confined to bed  Vitals:   03/08/22 0326 03/08/22 1037  BP: 91/62 95/64  Pulse: 93 (!) 118  Resp: 17 20  Temp: 97.7 F (36.5 C) 98.2 F (36.8 C)  SpO2: 96% 96%   Filed Weights   03/04/22 0629 03/05/22 0420 03/07/22 0517  Weight: 70.9 kg 73.2 kg 69.7 kg    Intake/Output from previous day: 06/28 0701 - 06/29 0700 In: 310 [P.O.:310] Out: 300 [Urine:300]  Physical Exam Vitals reviewed.  Constitutional:      General: He is not in acute distress. HENT:     Head: Normocephalic.     Mouth/Throat:     Pharynx: No oropharyngeal exudate or posterior oropharyngeal erythema.  Eyes:     General: No scleral icterus. Cardiovascular:     Rate and  Rhythm: Tachycardia present.  Pulmonary:     Effort: Pulmonary effort is normal. No respiratory distress.  Abdominal:     Comments: Abdomen distended, ascites present  Skin:    General: Skin is warm and dry.  Neurological:     Mental Status: He is alert and oriented to person, place, and time.     LABORATORY DATA:  I have reviewed the data as listed    Latest Ref Rng & Units 03/08/2022    3:07 AM 03/07/2022    3:31 AM 03/06/2022    4:03 AM   CMP  Glucose 70 - 99 mg/dL 142  142  164   BUN 8 - 23 mg/dL 40  37  32   Creatinine 0.61 - 1.24 mg/dL 1.34  1.16  1.15   Sodium 135 - 145 mmol/L 142  142  140   Potassium 3.5 - 5.1 mmol/L 3.8  3.8  3.9   Chloride 98 - 111 mmol/L 116  115  113   CO2 22 - 32 mmol/L '20  20  21   '$ Calcium 8.9 - 10.3 mg/dL 8.5  8.7  8.6   Total Protein 6.5 - 8.1 g/dL 4.5  4.5  4.5   Total Bilirubin 0.3 - 1.2 mg/dL 1.8  1.8  1.8   Alkaline Phos 38 - 126 U/L 244  229  200   AST 15 - 41 U/L 53  71  92   ALT 0 - 44 U/L 119  137  151     Lab Results  Component Value Date   WBC 1.4 (LL) 03/07/2022   HGB 9.8 (L) 03/07/2022   HCT 29.3 (L) 03/07/2022   MCV 102.8 (H) 03/07/2022   PLT 55 (L) 03/07/2022   NEUTROABS 0.8 (L) 03/07/2022    Lab Results  Component Value Date   CEA1 2.51 08/22/2020   PSA1 1.0 05/17/2021    [place] ASCITES (ABDOMEN LIMITED)  Result Date: 03/07/2022 CLINICAL DATA:  71 year old male referred for possible paracentesis EXAM: LIMITED ABDOMEN ULTRASOUND FOR ASCITES TECHNIQUE: Limited ultrasound survey for ascites was performed in all four abdominal quadrants. COMPARISON:  None Available. FINDINGS: Limited ultrasound demonstrates scant ascites. Paracentesis not performed. IMPRESSION: Ultrasound demonstrates scant ascites. Electronically Signed   By: Corrie Mckusick D.O.   On: 03/07/2022 13:02   US Paracentesis  Result Date: 03/02/2022 INDICATION: Patient with history of metastatic gastric adenocarcinoma with recurrent ascites. Request for therapeutic and diagnostic paracentesis. EXAM: ULTRASOUND GUIDED  PARACENTESIS MEDICATIONS: 10 mL 1% lidocaine COMPLICATIONS: None immediate. PROCEDURE: Informed written consent was obtained from the patient after a discussion of the risks, benefits and alternatives to treatment. A timeout was performed prior to the initiation of the procedure. Initial ultrasound scanning demonstrates a large amount of ascites within the right lower abdominal quadrant. The right  lower abdomen was prepped and draped in the usual sterile fashion. 1% lidocaine was used for local anesthesia. Following this, a 19 gauge, 10-cm, Yueh catheter was introduced. An ultrasound image was saved for documentation purposes. The paracentesis was performed. The catheter was removed and a dressing was applied. The patient tolerated the procedure well without immediate post procedural complication. FINDINGS: A total of approximately 3.5 L of hazy amber fluid was removed. Samples were sent to the laboratory as requested by the clinical team. IMPRESSION: Successful ultrasound-guided paracentesis yielding 3.5 liters of peritoneal fluid. Determined by Electronically Signed   By: Corrie Mckusick D.O.   On: 03/02/2022 13:48   VAS [place] LOWER EXTREMITY VENOUS (DVT) (  7a-7p)  Result Date: 03/01/2022  Lower Venous DVT Study Patient Name:  [facility]  Date of Exam:   03/01/2022 Medical Rec #: [id number]      Accession #:    [id number] Date of Birth: 1951-08-20      Patient Gender: M Patient Age:   57 years Exam Location:  [facility] Procedure:      VAS [place] LOWER EXTREMITY VENOUS (DVT) Referring Phys: JULIE HAVILAND --------------------------------------------------------------------------------  Indications: Edema.  Risk Factors: Cancer. Limitations: Poor ultrasound/tissue interface and calcific shadowing. Comparison Study: No previous exams Performing Technologist: Jody Hill RVT, RDMS  Examination Guidelines: A complete evaluation includes B-mode imaging, spectral Doppler, color Doppler, and power Doppler as needed of all accessible portions of each vessel. Bilateral testing is considered an integral part of a complete examination. Limited examinations for reoccurring indications may be performed as noted. The reflux portion of the exam is performed with the patient in reverse Trendelenburg.  +---------+---------------+---------+-----------+----------+--------------+ RIGHT     CompressibilityPhasicitySpontaneityPropertiesThrombus Aging +---------+---------------+---------+-----------+----------+--------------+ CFV      Full           Yes      Yes                                 +---------+---------------+---------+-----------+----------+--------------+ SFJ      Full                                                        +---------+---------------+---------+-----------+----------+--------------+ FV Prox  Full           Yes      Yes                                 +---------+---------------+---------+-----------+----------+--------------+ FV Mid   Full           Yes      Yes                                 +---------+---------------+---------+-----------+----------+--------------+ FV DistalFull           Yes      Yes                                 +---------+---------------+---------+-----------+----------+--------------+ PFV      Full                                                        +---------+---------------+---------+-----------+----------+--------------+ POP      Full           Yes      Yes                                 +---------+---------------+---------+-----------+----------+--------------+ PTV      Full                                                        +---------+---------------+---------+-----------+----------+--------------+  PERO     Full                                                        +---------+---------------+---------+-----------+----------+--------------+   +---------+---------------+---------+-----------+----------+-------------------+ LEFT     CompressibilityPhasicitySpontaneityPropertiesThrombus Aging      +---------+---------------+---------+-----------+----------+-------------------+ CFV      Full           Yes      Yes                                      +---------+---------------+---------+-----------+----------+-------------------+ SFJ      Full                                                              +---------+---------------+---------+-----------+----------+-------------------+ FV Prox  Full           Yes      Yes                                      +---------+---------------+---------+-----------+----------+-------------------+ FV Mid   Full           Yes      Yes                                      +---------+---------------+---------+-----------+----------+-------------------+ FV DistalFull           Yes      Yes                                      +---------+---------------+---------+-----------+----------+-------------------+ PFV      Full                                                             +---------+---------------+---------+-----------+----------+-------------------+ POP      Full           Yes      Yes                                      +---------+---------------+---------+-----------+----------+-------------------+ PTV      Full                                                             +---------+---------------+---------+-----------+----------+-------------------+ PERO  Not well visualized +---------+---------------+---------+-----------+----------+-------------------+     Summary: BILATERAL: - No evidence of deep vein thrombosis seen in the lower extremities, bilaterally. -No evidence of popliteal cyst, bilaterally   *See table(s) above for measurements and observations. Electronically signed by Jamelle Haring on 03/01/2022 at 8:25:28 PM.    Final    CT CHEST ABDOMEN PELVIS W CONTRAST  Result Date: 03/01/2022 CLINICAL DATA:  Metastatic gastric cancer, assess treatment response, progressive weakness * Tracking Code: BO * EXAM: CT CHEST, ABDOMEN, AND PELVIS WITH CONTRAST TECHNIQUE: Multidetector CT imaging of the chest, abdomen and pelvis was performed following the standard protocol during bolus administration of intravenous contrast.  RADIATION DOSE REDUCTION: This exam was performed according to the departmental dose-optimization program which includes automated exposure control, adjustment of the mA and/or kV according to patient size and/or use of iterative reconstruction technique. CONTRAST:  171m OMNIPAQUE IOHEXOL 300 MG/ML SOLN additional oral enteric contrast COMPARISON:  CT abdomen pelvis, 12/22/2021, CT chest abdomen pelvis, 11/17/2021 FINDINGS: CT CHEST FINDINGS Cardiovascular: Right chest port catheter. Aortic atherosclerosis. Normal heart size. Three-vessel coronary artery calcifications status post median sternotomy and CABG. No pericardial effusion. Mediastinum/Nodes: No enlarged mediastinal, hilar, or axillary lymph nodes. Thyroid gland, trachea, and esophagus demonstrate no significant findings. Lungs/Pleura: Unchanged, bandlike scarring and or atelectasis of the bilateral lung bases. Unchanged fissural nodule of the superior segment right lower lobe measuring 0.3 cm, stable and benign (series 4, image 70). Mild paraseptal emphysema. No pleural effusion or pneumothorax. Musculoskeletal: No chest wall mass or suspicious osseous lesions identified. CT ABDOMEN PELVIS FINDINGS Hepatobiliary: Unchanged, very subtle hypodense metastases of the left lobe of the liver, hepatic segment II, measuring 1.1 x 1.0 cm (series 2, image 43) and in the posterior right lobe of the liver, hepatic segment VI measuring 1.1 x 0.9 cm (series 2, image 55). No gallstones, gallbladder wall thickening, or biliary dilatation. Pancreas: Unremarkable. No pancreatic ductal dilatation or surrounding inflammatory changes. Spleen: Normal in size without significant abnormality. Adrenals/Urinary Tract: Adrenal glands are unremarkable. Lobulated renal cortical scarring. Simple, benign renal cortical cysts of the inferior pole of the right kidney, for which no further follow-up or characterization is required. Bladder is unremarkable. Stomach/Bowel: Unchanged,  treated mass of the lesser curvature of the stomach measuring approximately 7.1 x 2.9 cm (series 2, image 53). Appendix appears normal. No evidence of bowel wall thickening, distention, or inflammatory changes. Vascular/Lymphatic: Aortic atherosclerosis. No enlarged abdominal or pelvic lymph nodes. Reproductive: No mass or other abnormality. Other: No abdominal wall hernia or abnormality. Significantly increased, large volume ascites throughout the abdomen and pelvis, with stranding of the omentum and peritoneum (series 2, image 54) Musculoskeletal: No acute osseous findings. IMPRESSION: 1. Unchanged, treated mass of the lesser curvature of the stomach, in keeping with known gastric malignancy. 2. Unchanged, very subtle liver metastases.  No new liver lesions. 3. Significantly increased, large volume ascites throughout the abdomen and pelvis, with stranding of the omentum and peritoneum, suspicious for peritoneal metastatic disease but without overt nodularity. 4. No evidence of metastatic disease in the chest. Aortic Atherosclerosis (ICD10-I70.0) and Emphysema (ICD10-J43.9). Electronically Signed   By: ADelanna AhmadiM.D.   On: 03/01/2022 14:41   IR Paracentesis  Result Date: 02/23/2022 INDICATION: Metastatic gastric adenocarcinoma with ascites. Request for and therapeutic paracentesis. EXAM: ULTRASOUND GUIDED PARACENTESIS MEDICATIONS: 1% lidocaine 8 mL COMPLICATIONS: None immediate. PROCEDURE: Informed written consent was obtained from the patient after a discussion of the risks, benefits and alternatives to treatment. A timeout was performed prior to  the initiation of the procedure. Initial ultrasound scanning demonstrates a large amount of ascites within the right lower abdominal quadrant. The right lower abdomen was prepped and draped in the usual sterile fashion. 1% lidocaine was used for local anesthesia. Following this, a 19 gauge, 7-cm, Yueh catheter was introduced. An ultrasound image was saved for  documentation purposes. The paracentesis was performed. The catheter was removed and a dressing was applied. The patient tolerated the procedure well without immediate post procedural complication. Patient received post-procedure intravenous albumin; see nursing notes for details. FINDINGS: A total of approximately 4.4 L of clear yellow fluid was removed. Samples were sent to the laboratory as requested by the clinical team. IMPRESSION: Successful ultrasound-guided paracentesis yielding 4.4 liters of peritoneal fluid. Procedure performed by: Gareth Eagle, PA-C Electronically Signed   By: Jacqulynn Cadet M.D.   On: 02/23/2022 15:39   US Paracentesis  Result Date: 02/13/2022 INDICATION: History of metastatic gastric adenocarcinoma with new onset ascites. Patient was referred to IR for diagnostic and therapeutic paracentesis. EXAM: ULTRASOUND GUIDED DIAGNOSTIC AND THERAPEUTIC RIGHT LOWER QUADRANT PARACENTESIS MEDICATIONS: 10 mL 1 % lidocaine COMPLICATIONS: None immediate. PROCEDURE: Informed written consent was obtained from the patient after a discussion of the risks, benefits and alternatives to treatment. A timeout was performed prior to the initiation of the procedure. Initial ultrasound scanning demonstrates a large amount of ascites within the right lower abdominal quadrant. The right lower abdomen was prepped and draped in the usual sterile fashion. 1% lidocaine was used for local anesthesia. Following this, a 19 gauge, 7-cm, Yueh catheter was introduced. An ultrasound image was saved for documentation purposes. The paracentesis was performed. The catheter was removed and a dressing was applied. The patient tolerated the procedure well without immediate post procedural complication. FINDINGS: A total of approximately 4.4 L of clear, yellow fluid was removed. Samples were sent to the laboratory as requested by the clinical team. IMPRESSION: Successful ultrasound-guided paracentesis yielding 4.4 liters of  peritoneal fluid. Read by: Narda Rutherford, AGNP-BC Electronically Signed   By: Michaelle Birks M.D.   On: 02/13/2022 17:29   [place] ASCITES (ABDOMEN LIMITED)  Result Date: 02/12/2022 CLINICAL DATA:  Ascites.  Gastric cancer. EXAM: LIMITED ABDOMEN ULTRASOUND FOR ASCITES TECHNIQUE: Limited ultrasound survey for ascites was performed in all four abdominal quadrants. COMPARISON:  CT abdomen 12/22/2021 FINDINGS: There is a moderate to large amount of ascites in the abdomen. This represents a substantial increase compared to 12/22/2021. IMPRESSION: 1. Moderate to large amount of ascites, increased from April 2023. Electronically Signed   By: Van Clines M.D.   On: 02/12/2022 20:05   DG Abd Portable 1V  Result Date: 02/12/2022 CLINICAL DATA:  [id number]; follow-up ileus EXAM: PORTABLE ABDOMEN - 1 VIEW COMPARISON:  Radiograph dated February 11, 2022 FINDINGS: Air-filled nondilated loops of bowel. Incomplete assessment of the pelvis and lung bases. Degenerative changes of the lower lumbar spine. IMPRESSION: Nonobstructive bowel gas pattern. Electronically Signed   By: Valentino Saxon M.D.   On: 02/12/2022 08:53   DG Abd 1 View  Result Date: 02/11/2022 CLINICAL DATA:  Abdominal distension EXAM: ABDOMEN - 1 VIEW COMPARISON:  February 20, 2021 FINDINGS: Evaluation is limited due to supine imaging. Within this limitation, no free air, portal venous gas, or pneumatosis is identified. The stomach is air-filled and mildly prominent. Otherwise, the bowel gas pattern is nonobstructive. Large and small bowel loops are normal in caliber. No other acute abnormalities. IMPRESSION: Large and small bowel loops are normal in caliber. The stomach  contains air and is mildly prominent. No other abnormalities. Electronically Signed   By: Dorise Bullion III M.D.   On: 02/11/2022 12:42   ECHOCARDIOGRAM COMPLETE  Result Date: 02/10/2022    ECHOCARDIOGRAM REPORT   Patient Name:   Jimmy Blankenship Date of Exam: 02/09/2022 Medical Rec #:  [id number]      Height:       69.0 in Accession #:    [id number]    Weight:       147.0 lb Date of Birth:  [birth year]     BSA:          1.813 m Patient Age:    6 years      BP:           99/61 mmHg Patient Gender: M             HR:           105 bpm. Exam Location:  Inpatient Procedure: 2D Echo Indications:    Chemo. History of intracardiac thrombus.  History:        Patient has prior history of Echocardiogram examinations, most                 recent 08/28/2021. CAD, Cancer; Risk Factors:Dyslipidemia,                 Hypertension and Diabetes.  Sonographer:    Joette Catching Referring Phys: [place]  1. No left ventricular thrombus is seen with Definity contrast. Left ventricular ejection fraction, by estimation, is 35 to 40%. The left ventricle has moderately decreased function. The left ventricle demonstrates regional wall motion abnormalities (see scoring diagram/findings for description). Left ventricular diastolic parameters are consistent with Grade I diastolic dysfunction (impaired relaxation). There is dyskinesis of the entire left ventricular apex.  2. Right ventricular systolic function is normal. The right ventricular size is normal. There is normal pulmonary artery systolic pressure.  3. Left atrial size was severely dilated.  4. The mitral valve is normal in structure. Moderate mitral valve regurgitation.  5. The aortic valve is tricuspid. There is mild calcification of the aortic valve. There is mild thickening of the aortic valve. Aortic valve regurgitation is not visualized. Aortic valve sclerosis is present, with no evidence of aortic valve stenosis.  6. The inferior vena cava is dilated in size with >50% respiratory variability, suggesting right atrial pressure of 8 mmHg. Comparison(s): No significant change from prior study. Prior images reviewed side by side. FINDINGS  Left Ventricle: No left ventricular thrombus is seen with Definity contrast. Left ventricular ejection fraction, by  estimation, is 35 to 40%. The left ventricle has moderately decreased function. The left ventricle demonstrates regional wall motion abnormalities. Definity contrast agent was given IV to delineate the left ventricular endocardial borders. The left ventricular internal cavity size was normal in size. There is no left ventricular hypertrophy. Left ventricular diastolic parameters are consistent with Grade I diastolic dysfunction (impaired relaxation). Normal left ventricular filling pressure.  LV Wall Scoring: The entire apex is dyskinetic. Right Ventricle: The right ventricular size is normal. No increase in right ventricular wall thickness. Right ventricular systolic function is normal. There is normal pulmonary artery systolic pressure. The tricuspid regurgitant velocity is 2.19 m/s, and  with an assumed right atrial pressure of 3 mmHg, the estimated right ventricular systolic pressure is 35.3 mmHg. Left Atrium: Left atrial size was severely dilated. Right Atrium: Right atrial size was normal in size. Pericardium: There is no  evidence of pericardial effusion. Mitral Valve: The mitral valve is normal in structure. Moderate mitral valve regurgitation, with eccentric posteriorly directed jet. Tricuspid Valve: The tricuspid valve is normal in structure. Tricuspid valve regurgitation is mild. Aortic Valve: The aortic valve is tricuspid. There is mild calcification of the aortic valve. There is mild thickening of the aortic valve. Aortic valve regurgitation is not visualized. Aortic valve sclerosis is present, with no evidence of aortic valve stenosis. Aortic valve mean gradient measures 4.0 mmHg. Aortic valve peak gradient measures 6.1 mmHg. Aortic valve area, by VTI measures 3.86 cm. Pulmonic Valve: The pulmonic valve was normal in structure. Pulmonic valve regurgitation is not visualized. Aorta: The aortic root is normal in size and structure. Venous: The inferior vena cava is dilated in size with greater than 50%  respiratory variability, suggesting right atrial pressure of 8 mmHg. IAS/Shunts: No atrial level shunt detected by color flow Doppler. Additional Comments: A venous catheter is visualized in the right atrium. There is pleural effusion in the left lateral region.  LEFT VENTRICLE PLAX 2D LVIDd:         4.00 cm      Diastology LVIDs:         3.40 cm      LV e' lateral:   13.40 cm/s LV PW:         1.10 cm      LV E/e' lateral: 3.9 LV IVS:        1.10 cm LVOT diam:     2.20 cm LV SV:         81 LV SV Index:   45 LVOT Area:     3.80 cm  LV Volumes (MOD) LV vol d, MOD A2C: 114.0 ml LV vol d, MOD A4C: 123.0 ml LV vol s, MOD A2C: 63.9 ml LV vol s, MOD A4C: 85.8 ml LV SV MOD A2C:     50.1 ml LV SV MOD A4C:     123.0 ml LV SV MOD BP:      46.7 ml RIGHT VENTRICLE            IVC RV Basal diam:  2.40 cm    IVC diam: 2.00 cm RV Mid diam:    1.90 cm RV S prime:     8.27 cm/s TAPSE (M-mode): 0.6 cm LEFT ATRIUM             Index        RIGHT ATRIUM           Index LA diam:        4.90 cm 2.70 cm/m   RA Area:     16.40 cm LA Vol (A2C):   48.8 ml 26.92 ml/m  RA Volume:   35.20 ml  19.42 ml/m LA Vol (A4C):   52.3 ml 28.85 ml/m LA Biplane Vol: 51.7 ml 28.52 ml/m  AORTIC VALVE                     PULMONIC VALVE AV Area (Vmax):    3.83 cm      PV Vmax:       0.99 m/s AV Area (Vmean):   3.25 cm      PV Peak grad:  3.9 mmHg AV Area (VTI):     3.86 cm AV Vmax:           123.00 cm/s AV Vmean:          104.000 cm/s AV VTI:  0.211 m AV Peak Grad:      6.1 mmHg AV Mean Grad:      4.0 mmHg LVOT Vmax:         124.00 cm/s LVOT Vmean:        88.900 cm/s LVOT VTI:          0.214 m LVOT/AV VTI ratio: 1.01  AORTA Ao Root diam: 3.50 cm MITRAL VALVE                  TRICUSPID VALVE MV Area (PHT): 9.85 cm       TR Peak grad:   19.2 mmHg MV Decel Time: 77 msec        TR Vmax:        219.00 cm/s MR Peak grad:    106.5 mmHg MR Mean grad:    65.0 mmHg    SHUNTS MR Vmax:         516.00 cm/s  Systemic VTI:  0.21 m MR Vmean:        387.0 cm/s    Systemic Diam: 2.20 cm MR PISA:         0.57 cm MR PISA Eff ROA: 4 mm MR PISA Radius:  0.30 cm MV E velocity: 52.00 cm/s MV A velocity: 114.00 cm/s MV E/A ratio:  0.46 Mihai Croitoru MD Electronically signed by Sanda Klein MD Signature Date/Time: 02/10/2022/10:06:18 AM    Final      Future Appointments  Date Time Provider [place]  03/08/2022 12:40 PM Gery Pray, MD [facility] None  03/09/2022 11:45 AM [facility] LINAC 4 [facility] None  03/12/2022  3:45 PM [facility] LINAC 4 [facility] None  03/12/2022  4:00 PM LINAC-KINARD [facility] None  03/14/2022 11:45 AM [facility] LINAC 4 [facility] None  03/15/2022 11:45 AM [facility] LINAC 4 [facility] None  03/16/2022  2:45 PM [facility] LINAC 4 [facility] None  03/19/2022 10:00 AM [facility] LINAC 4 [facility] None  03/20/2022 10:00 AM [facility] LINAC 4 [facility] None  03/21/2022 10:00 AM [facility] LINAC 4 [facility] None  03/22/2022 10:00 AM [facility] LINAC 4 [facility] None  03/23/2022 10:00 AM [facility] LINAC 4 [facility] None  03/26/2022 10:00 AM [facility] LINAC 4 [facility] None  03/27/2022 10:00 AM [facility] LINAC 4 [facility] None  03/28/2022 10:00 AM [facility] LINAC 4 [facility] None  03/29/2022 10:00 AM [facility] LINAC 4 [facility] None  03/30/2022 10:00 AM [facility] LINAC 4 [facility] None  04/02/2022 10:00 AM [facility] LINAC 4 [facility] None  04/03/2022 10:00 AM [facility] LINAC 4 [facility] None  04/04/2022 10:00 AM [facility] LINAC 4 [facility] None  04/05/2022  9:00 AM [facility] LAB [facility] None  04/05/2022  9:15 AM [facility] INJ NURSE [facility] None  04/05/2022  9:30 AM Ennever, Rudell Cobb, MD [facility] None  04/05/2022 10:00 AM [facility] A2 [facility] None  04/05/2022  2:00 PM [facility] LINAC 4 [facility] None  04/06/2022 10:00 AM [facility] LINAC 4 [facility] None  04/09/2022 10:00 AM [facility] LINAC 4 [facility] None  04/10/2022 10:00 AM [facility] LINAC 4 [facility] None  04/11/2022 10:00 AM  [facility] LINAC 4 [facility] None  04/12/2022 10:00 AM [facility] LINAC 4 [facility] None      LOS: 5 days

## 2022-03-09 ENCOUNTER — Ambulatory Visit: Payer: Medicare Other

## 2022-03-12 ENCOUNTER — Ambulatory Visit: Payer: Medicare Other

## 2022-03-12 ENCOUNTER — Ambulatory Visit: Payer: Medicare Other | Attending: Radiation Oncology

## 2022-03-14 ENCOUNTER — Ambulatory Visit: Payer: Medicare Other

## 2022-03-14 ENCOUNTER — Telehealth: Payer: Self-pay | Admitting: *Deleted

## 2022-03-14 NOTE — Telephone Encounter (Signed)
Per scheduling message Jimmy Blankenship - cancel all appointments - patient is in hospice

## 2022-03-15 ENCOUNTER — Ambulatory Visit: Payer: Medicare Other

## 2022-03-16 ENCOUNTER — Other Ambulatory Visit: Payer: Medicare Other

## 2022-03-16 ENCOUNTER — Ambulatory Visit: Payer: Medicare Other

## 2022-03-16 ENCOUNTER — Ambulatory Visit: Payer: Medicare Other | Admitting: Hematology & Oncology

## 2022-03-19 ENCOUNTER — Ambulatory Visit: Payer: Medicare Other

## 2022-03-20 ENCOUNTER — Ambulatory Visit: Payer: Medicare Other

## 2022-03-21 ENCOUNTER — Ambulatory Visit: Payer: Medicare Other

## 2022-03-22 ENCOUNTER — Ambulatory Visit: Payer: Medicare Other

## 2022-03-23 ENCOUNTER — Ambulatory Visit: Payer: Medicare Other

## 2022-03-26 ENCOUNTER — Ambulatory Visit: Payer: Medicare Other

## 2022-03-27 ENCOUNTER — Ambulatory Visit: Payer: Medicare Other

## 2022-03-28 ENCOUNTER — Ambulatory Visit: Payer: Medicare Other

## 2022-03-29 ENCOUNTER — Ambulatory Visit: Payer: Medicare Other

## 2022-03-30 ENCOUNTER — Ambulatory Visit: Payer: Medicare Other

## 2022-04-02 ENCOUNTER — Ambulatory Visit: Payer: Medicare Other

## 2022-04-03 ENCOUNTER — Ambulatory Visit: Payer: Medicare Other

## 2022-04-04 ENCOUNTER — Ambulatory Visit: Payer: Medicare Other

## 2022-04-05 ENCOUNTER — Other Ambulatory Visit: Payer: Medicare Other

## 2022-04-05 ENCOUNTER — Ambulatory Visit: Payer: Medicare Other

## 2022-04-05 ENCOUNTER — Ambulatory Visit: Payer: Medicare Other | Admitting: Hematology & Oncology

## 2022-04-06 ENCOUNTER — Ambulatory Visit: Payer: Medicare Other

## 2022-04-09 ENCOUNTER — Ambulatory Visit: Payer: Medicare Other

## 2022-04-10 ENCOUNTER — Ambulatory Visit: Payer: Medicare Other

## 2022-04-11 ENCOUNTER — Ambulatory Visit: Payer: Medicare Other

## 2022-04-12 ENCOUNTER — Ambulatory Visit: Payer: Medicare Other

## 2022-09-07 ENCOUNTER — Other Ambulatory Visit: Payer: Self-pay

## 2022-09-07 ENCOUNTER — Emergency Department (HOSPITAL_COMMUNITY)
Admission: EM | Admit: 2022-09-07 | Discharge: 2022-09-07 | Disposition: A | Discharge status: Home / Self Care | Attending: Emergency Medicine | Admitting: Emergency Medicine

## 2022-09-07 DIAGNOSIS — E1122 Type 2 diabetes mellitus with diabetic chronic kidney disease: Secondary | ICD-10-CM | POA: Insufficient documentation

## 2022-09-07 DIAGNOSIS — N189 Chronic kidney disease, unspecified: Secondary | ICD-10-CM | POA: Insufficient documentation

## 2022-09-07 DIAGNOSIS — N390 Urinary tract infection, site not specified: Secondary | ICD-10-CM | POA: Diagnosis not present

## 2022-09-07 DIAGNOSIS — R339 Retention of urine, unspecified: Secondary | ICD-10-CM | POA: Diagnosis present

## 2022-09-07 DIAGNOSIS — I129 Hypertensive chronic kidney disease with stage 1 through stage 4 chronic kidney disease, or unspecified chronic kidney disease: Secondary | ICD-10-CM | POA: Insufficient documentation

## 2022-09-07 DIAGNOSIS — Z79899 Other long term (current) drug therapy: Secondary | ICD-10-CM | POA: Diagnosis not present

## 2022-09-07 DIAGNOSIS — K5900 Constipation, unspecified: Secondary | ICD-10-CM | POA: Diagnosis not present

## 2022-09-07 DIAGNOSIS — Z85028 Personal history of other malignant neoplasm of stomach: Secondary | ICD-10-CM | POA: Diagnosis not present

## 2022-09-07 DIAGNOSIS — I2510 Atherosclerotic heart disease of native coronary artery without angina pectoris: Secondary | ICD-10-CM | POA: Diagnosis not present

## 2022-09-07 LAB — URINALYSIS, ROUTINE W REFLEX MICROSCOPIC
Bilirubin Urine: NEGATIVE
Glucose, UA: >=500 mg/dL — AB
Ketones, ur: NEGATIVE mg/dL
Nitrite: NEGATIVE
Protein, ur: 100 mg/dL — AB
RBC / HPF: >50 RBC/hpf — ABNORMAL HIGH (ref 0–5)
Specific Gravity, Urine: 1.011 (ref 1.005–1.030)
WBC, UA: >50 WBC/hpf — ABNORMAL HIGH (ref 0–5)
pH: 8 (ref 5.0–8.0)

## 2022-09-07 MED ORDER — CEPHALEXIN 500 MG PO CAPS: 500.0000 mg | ORAL_CAPSULE | Freq: Four times a day (QID) | ORAL | 0 refills | Status: AC

## 2022-09-07 MED ORDER — LIDOCAINE HCL URETHRAL/MUCOSAL 2 % EX GEL
1.0000 | Freq: Once | CUTANEOUS | Status: AC
  Administered 2022-09-07: 1 via URETHRAL
  Filled 2022-09-07: qty 11

## 2022-09-07 MED ORDER — CEPHALEXIN 500 MG PO CAPS
500.0000 mg | ORAL_CAPSULE | Freq: Once | ORAL | Status: AC
  Administered 2022-09-07: 500 mg via ORAL
  Filled 2022-09-07: qty 1

## 2022-09-07 NOTE — ED Notes (Signed)
Docia Chuck from Urology came and place a foley catheter. It did not drain and so they had to manually pull the urine. Drained about 221ms. They Inserted a 16" coude catheter.

## 2022-09-07 NOTE — ED Notes (Signed)
[facility] called for transport.  

## 2022-09-07 NOTE — ED Notes (Signed)
[facility] arrived.  

## 2022-09-07 NOTE — ED Notes (Signed)
Daughter verbalized understanding of d/c instructions/ pt unable to sign d/c (esign) daughter left before able to sign

## 2022-09-07 NOTE — ED Provider Notes (Signed)
[facility] DEPT Provider Note   CSN: [id number] Arrival date & time: 09/07/22  1207     History  No chief complaint on file.   Jimmy Blankenship is a 71 y.o. male.  71 year old male brought in by EMS from home for urinary retention. Patient is in home hospice with history of metastatic gastric cancer, CKD, HTN, DM, CAD, hyperlipidemia. Patient reported pelvic pain and inability to void today. Home health nurse was unable to pass a foley catheter and patient was brought to the ER for management. He denies fevers, chills, CP. States he is constipated and unable to move his bowels. States normally when he can't move his bowels he uses a suppository and is then able to pass a normal stool without difficulty. Not currently on chemo, is tapering off home meds through hospice.        Home Medications Prior to Admission medications   Medication Sig Start Date End Date Taking? Authorizing Provider  bisacodyl (DULCOLAX) 10 MG suppository Place 1 suppository (10 mg total) rectally as needed for moderate constipation. 02/14/22   Debbe Odea, MD  feeding supplement (ENSURE ENLIVE / ENSURE PLUS) LIQD Take 237 mLs by mouth 2 (two) times daily between meals. Patient taking differently: Take 237 mLs by mouth daily. 08/16/20   Regalado, Belkys A, MD  gabapentin (NEURONTIN) 300 MG capsule Take 1 capsule (300 mg total) by mouth 4 (four) times daily. 10/31/21   Volanda Napoleon, MD  insulin glargine (LANTUS) 100 UNIT/ML Solostar Pen Inject 10-15 Units into the skin daily as needed (for High CBG durnig chemo may  take BID).    [provider]  LORazepam (ATIVAN) 0.5 MG tablet Take 1 tablet (0.5 mg total) by mouth every 6 (six) hours as needed for up to 20 doses for anxiety. 03/08/22   Mercy Riding, MD  megestrol (MEGACE) 400 MG/10ML suspension Take 10 mLs (400 mg total) by mouth 2 (two) times daily. Patient taking differently: Take 400 mg by mouth 2 (two) times daily as  needed (appetite). 02/21/21   Volanda Napoleon, MD  melatonin 5 MG TABS Take 5 mg by mouth at bedtime.    [provider]  metoprolol tartrate (LOPRESSOR) 25 MG tablet Take 0.5 tablets (12.5 mg total) by mouth 2 (two) times daily. 02/14/22 03/16/22  Debbe Odea, MD  ondansetron (ZOFRAN-ODT) 4 MG disintegrating tablet Take 1 tablet (4 mg total) by mouth every 8 (eight) hours as needed for nausea or vomiting. 03/08/22   Mercy Riding, MD  oxyCODONE (OXY IR/ROXICODONE) 5 MG immediate release tablet Take 1 tablet (5 mg total) by mouth every 8 (eight) hours as needed for moderate pain or severe pain. 03/08/22   Mercy Riding, MD  pantoprazole (PROTONIX) 40 MG tablet Take 1 tablet (40 mg total) by mouth 2 (two) times daily. Follow up with gastroenterology for additional instructions 12/30/21 03/02/22  Elodia Florence., MD  polyethylene glycol (MIRALAX / GLYCOLAX) 17 g packet Take 17 g by mouth daily as needed for mild constipation. 02/14/22   Debbe Odea, MD  simethicone (MYLICON) 80 MG chewable tablet Chew 1 tablet (80 mg total) by mouth 4 (four) times daily as needed for flatulence. 03/08/22   Mercy Riding, MD  tamsulosin (FLOMAX) 0.4 MG CAPS capsule Take 0.4 mg by mouth at bedtime. 08/07/21   [provider]  prochlorperazine (COMPAZINE) 10 MG tablet Take 1 tablet (10 mg total) by mouth every 6 (six) hours  as needed (Nausea or vomiting). Patient taking differently: Take 10 mg by mouth every 6 (six) hours as needed for nausea or vomiting (Nausea or vomiting). 02/01/21 03/07/22  Volanda Napoleon, MD      Allergies    Patient has no known allergies.    Review of Systems   Review of Systems Negative except as per HPI Physical Exam Updated Vital Signs BP 103/72   Pulse 82   Temp 97.8 F (36.6 C)   Resp 16   SpO2 97%  Physical Exam Vitals and nursing note reviewed. Exam conducted with a chaperone present.  Constitutional:      General: He is not in acute distress.    Appearance:  He is well-developed. He is cachectic. He is not diaphoretic.  HENT:     Head: Normocephalic and atraumatic.  Cardiovascular:     Rate and Rhythm: Normal rate and regular rhythm.     Pulses: Normal pulses.     Heart sounds: Normal heart sounds.  Pulmonary:     Effort: Pulmonary effort is normal.     Breath sounds: Normal breath sounds.  Abdominal:     Palpations: Abdomen is soft.     Tenderness: There is no abdominal tenderness.  Genitourinary:    Penis: Circumcised.      Prostate: Enlarged.     Comments: No significant stool burden  Musculoskeletal:     Right lower leg: No edema.     Left lower leg: No edema.  Skin:    General: Skin is warm and dry.     Findings: No erythema or rash.  Neurological:     Mental Status: He is alert and oriented to person, place, and time.  Psychiatric:        Behavior: Behavior normal.     ED Results / Procedures / Treatments   Labs (all labs ordered are listed, but only abnormal results are displayed) Labs Reviewed  URINALYSIS, ROUTINE W REFLEX MICROSCOPIC    EKG None  Radiology No results found.  Procedures Procedures    Medications Ordered in ED Medications  lidocaine (XYLOCAINE) 2 % jelly 1 Application (1 Application Urethral Given 09/07/22 1251)    ED Course/ Medical Decision Making/ A&P                           Medical Decision Making Amount and/or Complexity of Data Reviewed Labs: ordered.   This patient presents to the ED for concern of urinary retention, this involves an extensive number of treatment options, and is a complaint that carries with it a high risk of complications and morbidity.  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  The pertinent results include:  UA pending   Problem List / ED Course / Critical interventions / Medication management  71 year old male brought in by EMS from home with concern for urinary retention with suprapubic discomfort. Bladder scanner with possibly 350cc urine. A foley catheter was  placed by nurse at bedside who did not get any output and removed the catheter. The catheter team was then called and a foley was placed, again with no output. They were able to flush the catheter and had return and again without urine. After leaving the foley in place, urine did eventually drain to a total of roughly 572m, dark amber urine which was sent for UA. If evidence of infection, will send antibiotics. Patient will require medical transport home, has been placed up for dc to work on a transport plain while awaiting UA, oncoming PA to follow for results and tx if needed with Keflex.  I ordered medication including urojet  for pain relief with foley placement  Reevaluation of the patient after these medicines showed that the patient improved I have reviewed the patients home medicines and have made adjustments as needed   Social Determinants of Health:  Lives at home, hospice care in home   Test / Admission - Considered:  CBC/BMP considered however patient is afebrile, hospice patient, discussed goals of care with patient and family, plan is to hold off on obtaining labs at this time.         Final Clinical Impression(s) / ED Diagnoses Final diagnoses:  Urinary retention    Rx / DC Orders ED Discharge Orders     None         MTacy Learn PA-C 09/07/22 1455     SGareth Morgan MD 09/07/22 2312

## 2022-09-07 NOTE — ED Notes (Signed)
Bladder scan complete x3. Results as follows: 230 ml >385 ml 342 ml

## 2022-09-07 NOTE — Discharge Instructions (Addendum)
Follow up with urology, call to schedule an appointment.

## 2022-09-07 NOTE — ED Triage Notes (Signed)
Pt is coming from home via EMS. Has been complaining of pelvic and lower abdomen  swelling and pain. Unable to place a catheter   BP 94/80 RR 20 HR 90 CBG 332

## 2022-09-07 NOTE — Progress Notes (Unsigned)
   This pt is active with [facility]. He has had some urinary retention and the RN with hospice tried to place 64 F 3 way catheter and this was unsuccessful after a couple attempts pt was sent to ED for catheter placement in hopes of returning back home after this is completed.   Please notify us with any concerns. Webb Silversmith RN [phone]

## 2022-09-07 NOTE — ED Notes (Signed)
Patients hospice nurse called and stated he has a three way 49 French catheter.

## 2022-09-17 ENCOUNTER — Telehealth: Payer: Self-pay | Admitting: *Deleted

## 2022-09-17 NOTE — Telephone Encounter (Signed)
Received notification of patients passing by [facility].  Patient passed on 09/24/22

## 2022-10-09 ENCOUNTER — Encounter: Payer: Self-pay | Admitting: Family

## 2022-10-11 DEATH — deceased
